# Patient Record
Sex: Male | Born: 1980 | Race: White | Hispanic: No | Marital: Married | Smoking: Never smoker
Health system: Southern US, Community
[De-identification: ages and names within clinical notes are randomized; demographics above are authoritative.]

## PROBLEM LIST (undated history)

## (undated) DIAGNOSIS — C801 Malignant (primary) neoplasm, unspecified: Secondary | ICD-10-CM

## (undated) DIAGNOSIS — I456 Pre-excitation syndrome: Secondary | ICD-10-CM

## (undated) DIAGNOSIS — R12 Heartburn: Secondary | ICD-10-CM

## (undated) DIAGNOSIS — J189 Pneumonia, unspecified organism: Secondary | ICD-10-CM

## (undated) DIAGNOSIS — R0600 Dyspnea, unspecified: Secondary | ICD-10-CM

## (undated) HISTORY — PX: FRACTURE SURGERY: SHX138

## (undated) MED FILL — Fosaprepitant Dimeglumine For IV Infusion 150 MG (Base Eq): INTRAVENOUS | Qty: 5 | Status: AC

## (undated) MED FILL — Dexamethasone Sodium Phosphate Inj 100 MG/10ML: INTRAMUSCULAR | Qty: 1 | Status: AC

---

## 2021-02-16 ENCOUNTER — Other Ambulatory Visit: Payer: Self-pay | Admitting: Specialist

## 2021-02-16 ENCOUNTER — Other Ambulatory Visit (HOSPITAL_COMMUNITY): Payer: Self-pay | Admitting: Specialist

## 2021-02-16 DIAGNOSIS — R059 Cough, unspecified: Secondary | ICD-10-CM

## 2021-02-16 DIAGNOSIS — R918 Other nonspecific abnormal finding of lung field: Secondary | ICD-10-CM

## 2021-02-23 ENCOUNTER — Other Ambulatory Visit: Payer: Self-pay | Admitting: Specialist

## 2021-02-23 ENCOUNTER — Ambulatory Visit: Payer: Managed Care, Other (non HMO)

## 2021-02-23 ENCOUNTER — Ambulatory Visit
Admission: RE | Admit: 2021-02-23 | Discharge: 2021-02-23 | Disposition: A | Discharge status: Home / Self Care | Payer: Managed Care, Other (non HMO) | Source: Ambulatory Visit | Attending: Specialist | Admitting: Specialist

## 2021-02-23 DIAGNOSIS — R059 Cough, unspecified: Secondary | ICD-10-CM

## 2021-02-23 DIAGNOSIS — R918 Other nonspecific abnormal finding of lung field: Secondary | ICD-10-CM

## 2021-02-23 IMAGING — CT CT CHEST W/O CM
2 of 4 series · 11 of 36 positions shown, 13 images · non-contrast
Comparison: Report from prior chest radiographs without available
images, most recent [DATE].

CLINICAL DATA: Cough. Completed antibiotic therapy four days prior
for reported pneumonia.

EXAM:
CT CHEST WITHOUT CONTRAST
TECHNIQUE: Multidetector CT imaging of the chest was performed following the
standard protocol without IV contrast.

[Series 2: chest 2.00 br40 s3 · axial · 0.57mm/px · z∈[+1503,+1789]mm · 8 of 170 slices shown, 10 images (1 of 2)]
[im 14/170  mediastinal]
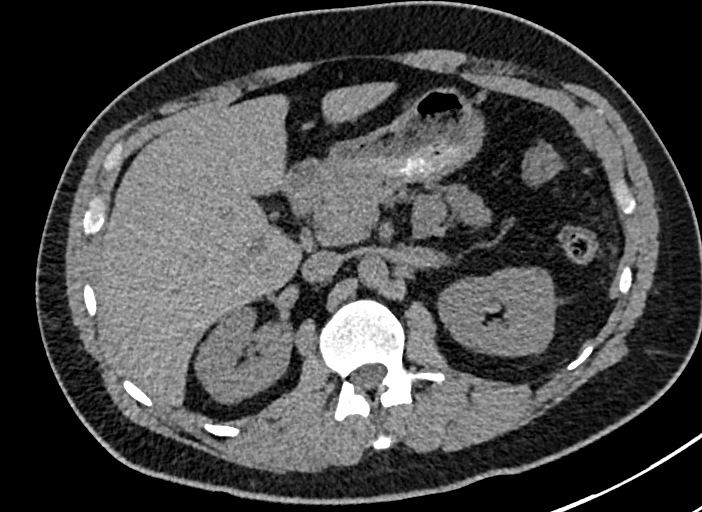
[im 14/170  lung]
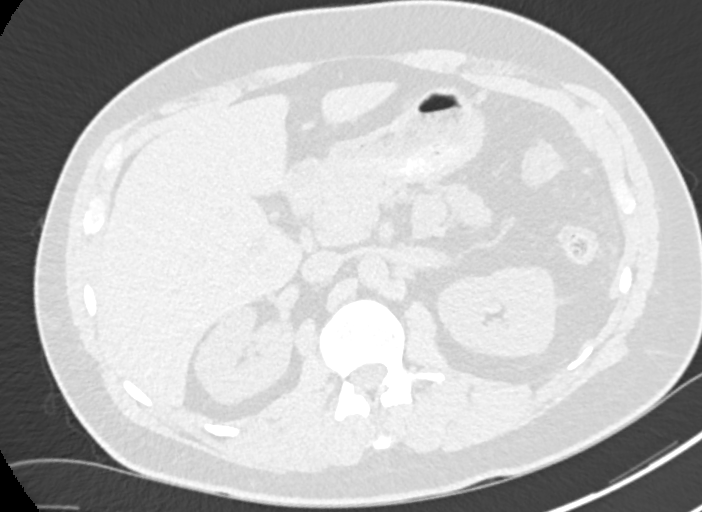
[im 40/170  lung]
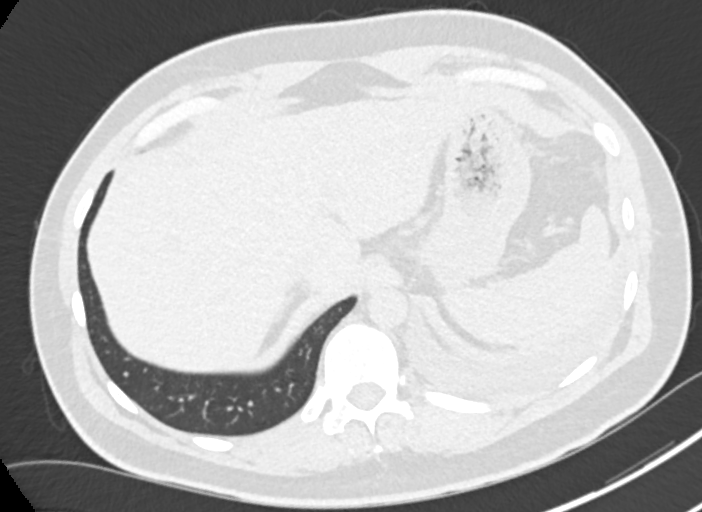
[im 53/170  lung]
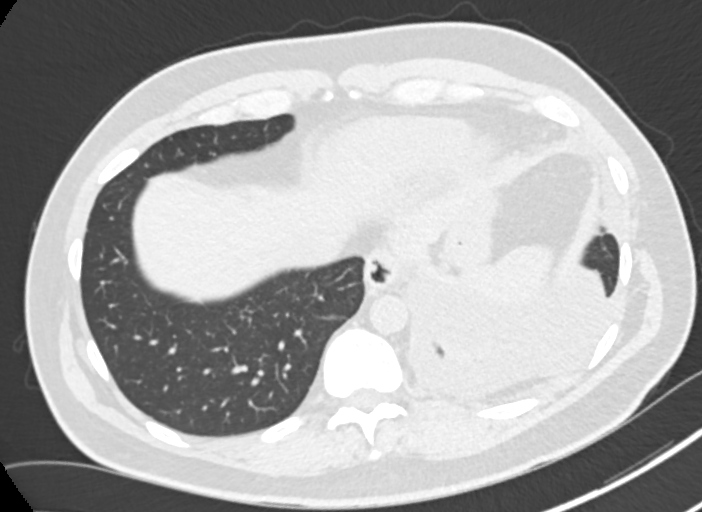
[im 79/170  lung]
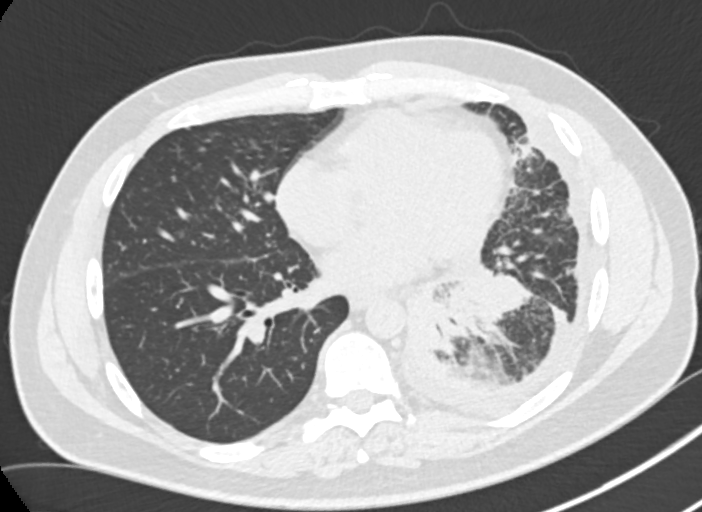
[im 92/170  mediastinal]
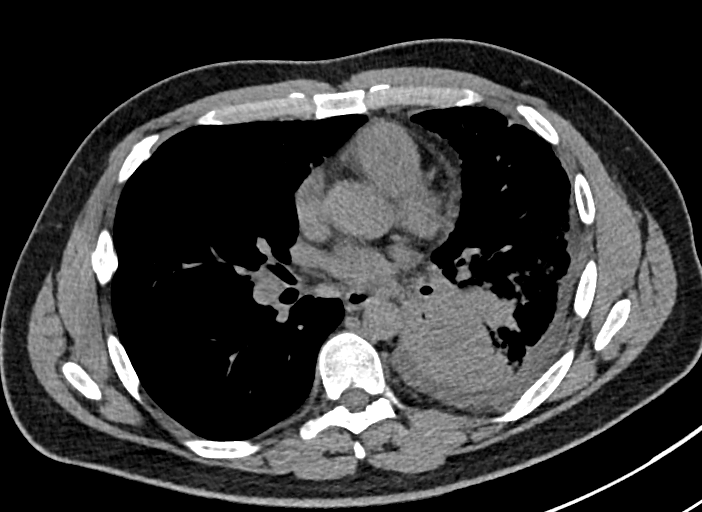
[im 92/170  lung]
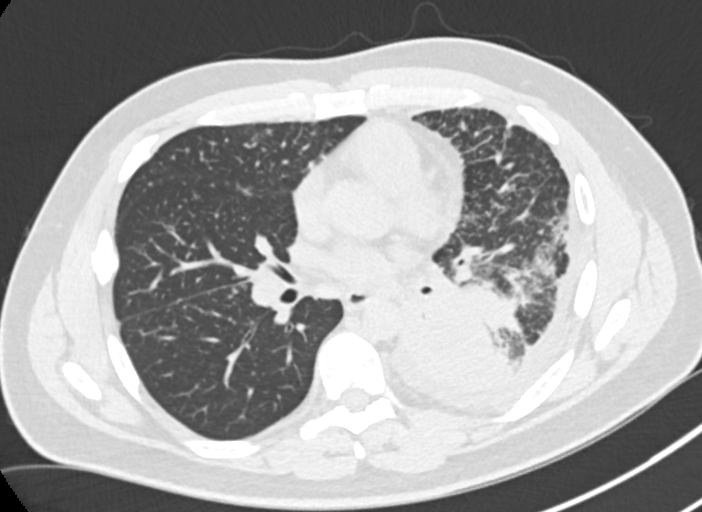
[im 118/170  lung]
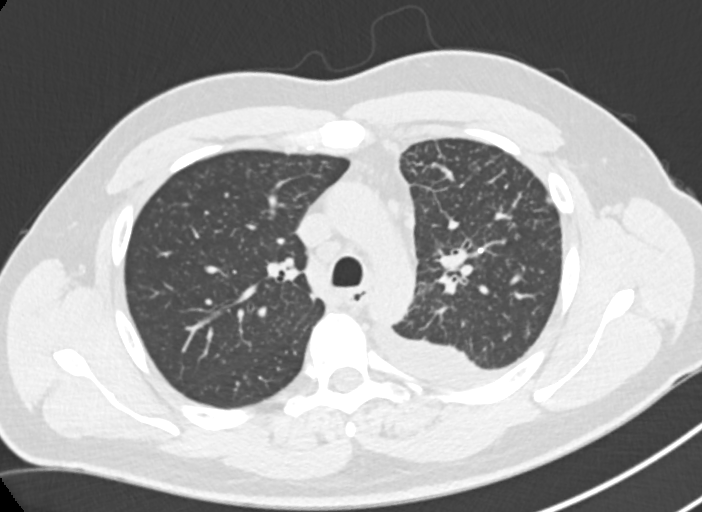
[im 131/170  lung]
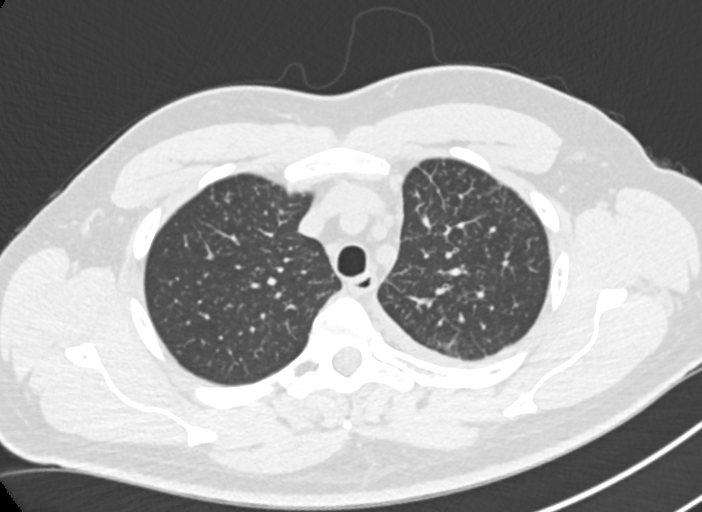
[im 157/170  lung]
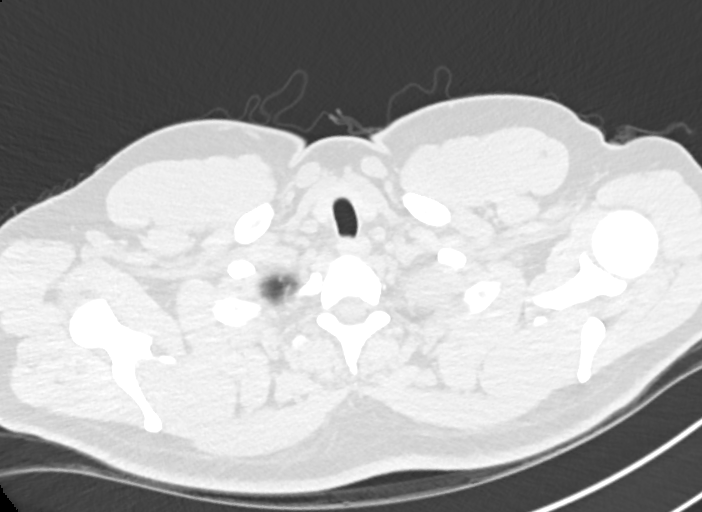

[Series 4: chest 2.00 br40 s3 · coronal · 0.67mm/px · 3 of 145 slices shown (2 of 2)]
[im 29/145  lung]
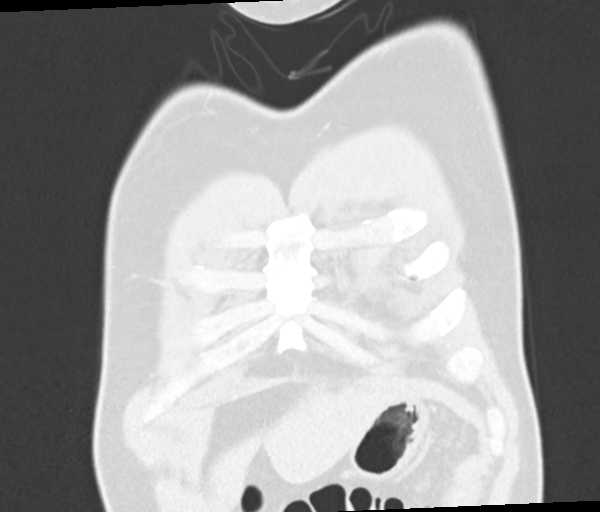
[im 58/145  lung]
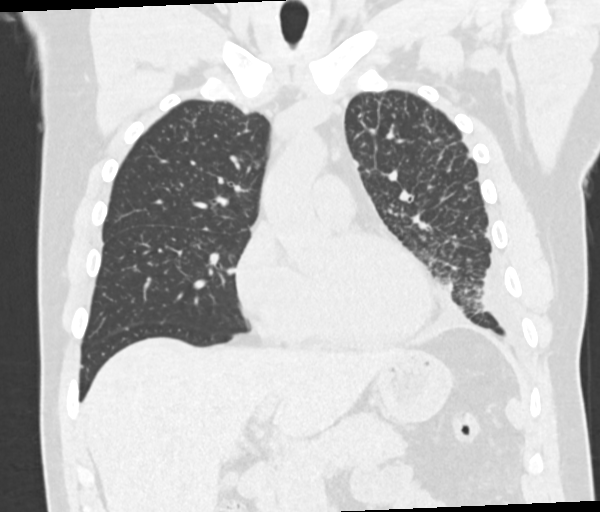
[im 87/145  lung]
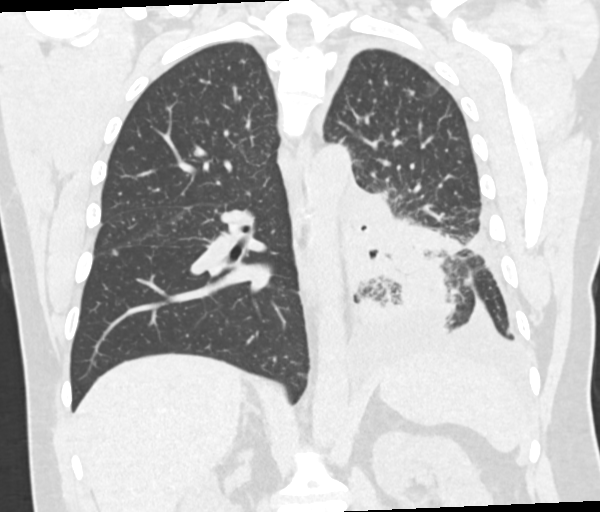

[11 of 36 positions shown; findings below may reference images not displayed]

FINDINGS: Cardiovascular: Normal heart size. No significant pericardial
effusion/thickening. Great vessels are normal in course and caliber.

Mediastinum/Nodes: No discrete thyroid nodules. Unremarkable
esophagus. No axillary adenopathy. Mildly enlarged 1.0 cm short axis
diameter subcarinal node (series 2/image 71). Mildly enlarged 1.0 cm
right paratracheal node (series 2/image 57). No discrete hilar
adenopathy on these noncontrast images.

Lungs/Pleura: No pneumothorax. Small dependent left pleural
effusion. No right pleural effusion. Dense patchy consolidation
replacing much of the left lower lobe, appearing masslike in the
superior segment left lower lobe measuring 6.1 x 4.0 cm (series
8/image 74) with associated bulging of left major fissure, with
associated left lower lobe volume loss. Scattered asymmetric
interlobular septal thickening throughout the left upper lobe. Fine
nodularity throughout both lungs with an upper lobe predominance,
appearing predominantly centrilobular. Scattered nodular foci of
peribronchovascular consolidation in left upper lobe, largest 7 mm
(series 2/image 56).

Upper abdomen: Patchy nodular fat stranding throughout the anterior
left upper quadrant peritoneal fat (series 2/image 163).

Musculoskeletal: No aggressive appearing focal osseous lesions.
Subacute healing lateral right sixth rib fracture.
IMPRESSION: 1. Patchy nodular fat stranding throughout the anterior left upper
quadrant peritoneal fat, nonspecific, cannot exclude peritoneal
carcinomatosis. Dedicated CT abdomen/pelvis with oral and IV
contrast recommended for further evaluation.
2. Dense patchy consolidation replacing much of the left lower lung
lobe, appearing masslike in the superior segment left lower lobe,
with associated bulging of the left major fissure and associated
left lower lobe volume loss. Fine nodularity throughout both lungs
with an upper lobe predominance. Asymmetric left upper lobe
interlobular septal thickening. These findings are indeterminate,
with differential including multilobar pneumonia, sarcoidosis or a
neoplastic process. The persistence on radiographs back to
[DATE] despite antibiotic therapy make sarcoidosis or a
neoplastic process more likely. Pulmonology consultation suggested
for consideration of bronchoscopic evaluation.
3. Small dependent left pleural effusion.
4. Mild mediastinal lymphadenopathy, nonspecific.
5. Subacute healing lateral right sixth rib fracture.

These results will be called to the ordering clinician or
representative by the Radiologist Assistant, and communication
documented in the PACS or [REDACTED].

## 2021-03-07 ENCOUNTER — Inpatient Hospital Stay: Admission: RE | Admit: 2021-03-07 | Payer: Managed Care, Other (non HMO) | Source: Ambulatory Visit

## 2021-03-08 ENCOUNTER — Other Ambulatory Visit: Payer: Self-pay

## 2021-03-08 ENCOUNTER — Other Ambulatory Visit
Admission: RE | Admit: 2021-03-08 | Discharge: 2021-03-08 | Disposition: A | Discharge status: Home / Self Care | Payer: Managed Care, Other (non HMO) | Source: Ambulatory Visit | Attending: Pulmonary Disease | Admitting: Pulmonary Disease

## 2021-03-08 HISTORY — DX: Heartburn: R12

## 2021-03-08 HISTORY — DX: Pneumonia, unspecified organism: J18.9

## 2021-03-08 HISTORY — DX: Dyspnea, unspecified: R06.00

## 2021-03-08 HISTORY — DX: Pre-excitation syndrome: I45.6

## 2021-03-08 NOTE — Patient Instructions (Addendum)
Your procedure is scheduled on: 03/15/21  Report to the Registration Desk on the 1st floor of the [facility]. To find out your arrival time, please call [phone] between 1PM - 3PM on: Report To [facility] on 03/10/21 at 815 am for Covid test and Labs.  REMEMBER: Instructions that are not followed completely may result in serious medical risk, up to and including death; or upon the discretion of your surgeon and anesthesiologist your surgery may need to be rescheduled.  Do not eat food after midnight the night before surgery.  No gum chewing, lozengers or hard candies.  You may however, drink CLEAR liquids up to 2 hours before you are scheduled to arrive for your surgery. Do not drink anything within 2 hours of your scheduled arrival time.  Clear liquids include: - water  - apple juice without pulp - gatorade (not RED, PURPLE, OR BLUE) - black coffee or tea (Do NOT add milk or creamers to the coffee or tea) Do NOT drink anything that is not on this list.  TAKE THESE MEDICATIONS THE MORNING OF SURGERY WITH A SIP OF WATER:  - ADVAIR DISKUS 250-50 MCG/ACT AEPB - benzonatate (TESSALON) 200 MG capsule  Use inhaler  albuterol (VENTOLIN HFA) 108 (90 Base) MCG/ACT on the day of surgery and bring to the hospital.  One week prior to surgery: Stop Anti-inflammatories (NSAIDS) such as Advil, Aleve, Ibuprofen, Motrin, Naproxen, Naprosyn and Aspirin based products such as Excedrin, Goodys Powder, BC Powder.  Stop ANY OVER THE COUNTER supplements until after surgery. You may however, continue to take Tylenol if needed for pain up until the day of surgery.  No Alcohol for 24 hours before or after surgery.  No Smoking including e-cigarettes for 24 hours prior to surgery.  No chewable tobacco products for at least 6 hours prior to surgery.  No nicotine patches on the day of surgery.  Do not use any "recreational" drugs for at least a week prior to your surgery.  Please be advised that  the combination of cocaine and anesthesia may have negative outcomes, up to and including death. If you test positive for cocaine, your surgery will be cancelled.  On the morning of surgery brush your teeth with toothpaste and water, you may rinse your mouth with mouthwash if you wish. Do not swallow any toothpaste or mouthwash.  Do not wear jewelry, make-up, hairpins, clips or nail polish.  Do not wear lotions, powders, or perfumes.   Do not shave body from the neck down 48 hours prior to surgery just in case you cut yourself which could leave a site for infection.  Also, freshly shaved skin may become irritated if using the CHG soap.  Contact lenses, hearing aids and dentures may not be worn into surgery.  Do not bring valuables to the hospital. [facility] is not responsible for any missing/lost belongings or valuables.   Use CHG Soap or wipes as directed on instruction sheet.  Notify your doctor if there is any change in your medical condition (cold, fever, infection).  Wear comfortable clothing (specific to your surgery type) to the hospital.  Plan for stool softeners for home use; pain medications have a tendency to cause constipation. You can also help prevent constipation by eating foods high in fiber such as fruits and vegetables and drinking plenty of fluids as your diet allows.  After surgery, you can help prevent lung complications by doing breathing exercises.  Take deep breaths and cough every 1-2 hours. Your  doctor may order a device called an Incentive Spirometer to help you take deep breaths. When coughing or sneezing, hold a pillow firmly against your incision with both hands. This is called "splinting." Doing this helps protect your incision. It also decreases belly discomfort.  If you are being admitted to the hospital overnight, leave your suitcase in the car. After surgery it may be brought to your room.  If you are being discharged the day of surgery, you will  not be allowed to drive home. You will need a responsible adult (18 years or older) to drive you home and stay with you that night.   If you are taking public transportation, you will need to have a responsible adult (18 years or older) with you. Please confirm with your physician that it is acceptable to use public transportation.   Please call the [facility] at [phone] if you have any questions about these instructions.  Surgery Visitation Policy:  Patients undergoing a surgery or procedure may have one family member or support person with them as long as that person is not COVID-19 positive or experiencing its symptoms.  That person may remain in the waiting area during the procedure.  Inpatient Visitation:    Visiting hours are 7 a.m. to 8 p.m. Inpatients will be allowed two visitors daily. The visitors may change each day during the patient's stay. No visitors under the age of 1. Any visitor under the age of 54 must be accompanied by an adult. The visitor must pass COVID-19 screenings, use hand sanitizer when entering and exiting the patient's room and wear a mask at all times, including in the patient's room. Patients must also wear a mask when staff or their visitor are in the room. Masking is required regardless of vaccination status.  Total Hip/Knee Replacement Preoperative Educational Video  To better prepare for surgery, please view our videos that explain the physical activity and discharge planning required to have the best surgical recovery at [facility].  [email or web address]      Questions? Call [phone] or email [email or web address]

## 2021-03-09 ENCOUNTER — Other Ambulatory Visit: Payer: Managed Care, Other (non HMO)

## 2021-03-10 ENCOUNTER — Other Ambulatory Visit
Admission: RE | Admit: 2021-03-10 | Discharge: 2021-03-10 | Disposition: A | Discharge status: Home / Self Care | Payer: Managed Care, Other (non HMO) | Source: Ambulatory Visit | Attending: Pulmonary Disease | Admitting: Pulmonary Disease

## 2021-03-10 ENCOUNTER — Other Ambulatory Visit: Payer: Self-pay

## 2021-03-10 DIAGNOSIS — I456 Pre-excitation syndrome: Secondary | ICD-10-CM | POA: Insufficient documentation

## 2021-03-10 DIAGNOSIS — Z20822 Contact with and (suspected) exposure to covid-19: Secondary | ICD-10-CM | POA: Insufficient documentation

## 2021-03-10 DIAGNOSIS — Z01812 Encounter for preprocedural laboratory examination: Secondary | ICD-10-CM | POA: Diagnosis present

## 2021-03-10 LAB — CBC
HCT: 44.1 % (ref 39.0–52.0)
Hemoglobin: 15.1 g/dL (ref 13.0–17.0)
MCH: 31.6 pg (ref 26.0–34.0)
MCHC: 34.2 g/dL (ref 30.0–36.0)
MCV: 92.3 fL (ref 80.0–100.0)
Platelets: 342 10*3/uL (ref 150–400)
RBC: 4.78 MIL/uL (ref 4.22–5.81)
RDW: 12.3 % (ref 11.5–15.5)
WBC: 10.8 10*3/uL — ABNORMAL HIGH (ref 4.0–10.5)
nRBC: 0 % (ref 0.0–0.2)

## 2021-03-10 LAB — PROTIME-INR
INR: 1 (ref 0.8–1.2)
Prothrombin Time: 13.3 seconds (ref 11.4–15.2)

## 2021-03-10 LAB — SARS CORONAVIRUS 2 (TAT 6-24 HRS): SARS Coronavirus 2: NEGATIVE

## 2021-03-10 LAB — APTT: aPTT: 26 seconds (ref 24–36)

## 2021-03-14 ENCOUNTER — Other Ambulatory Visit: Payer: Self-pay | Admitting: Pulmonary Disease

## 2021-03-14 DIAGNOSIS — R918 Other nonspecific abnormal finding of lung field: Secondary | ICD-10-CM

## 2021-03-15 ENCOUNTER — Encounter
Admission: RE | Disposition: A | Discharge status: Home / Self Care | Payer: Self-pay | Source: Home / Self Care | Attending: Pulmonary Disease

## 2021-03-15 ENCOUNTER — Ambulatory Visit: Payer: Managed Care, Other (non HMO)

## 2021-03-15 ENCOUNTER — Ambulatory Visit
Admission: RE | Admit: 2021-03-15 | Discharge: 2021-03-15 | Disposition: A | Discharge status: Home / Self Care | Payer: Managed Care, Other (non HMO) | Attending: Pulmonary Disease | Admitting: Pulmonary Disease

## 2021-03-15 ENCOUNTER — Encounter: Payer: Self-pay | Admitting: *Deleted

## 2021-03-15 ENCOUNTER — Ambulatory Visit: Payer: Managed Care, Other (non HMO) | Admitting: Urgent Care

## 2021-03-15 ENCOUNTER — Ambulatory Visit
Admission: RE | Admit: 2021-03-15 | Discharge: 2021-03-15 | Disposition: A | Discharge status: Home / Self Care | Payer: Managed Care, Other (non HMO) | Source: Ambulatory Visit | Attending: Pulmonary Disease | Admitting: Pulmonary Disease

## 2021-03-15 ENCOUNTER — Other Ambulatory Visit: Payer: Self-pay

## 2021-03-15 DIAGNOSIS — Z885 Allergy status to narcotic agent status: Secondary | ICD-10-CM | POA: Insufficient documentation

## 2021-03-15 DIAGNOSIS — C771 Secondary and unspecified malignant neoplasm of intrathoracic lymph nodes: Secondary | ICD-10-CM | POA: Diagnosis not present

## 2021-03-15 DIAGNOSIS — F17220 Nicotine dependence, chewing tobacco, uncomplicated: Secondary | ICD-10-CM | POA: Diagnosis not present

## 2021-03-15 DIAGNOSIS — R918 Other nonspecific abnormal finding of lung field: Secondary | ICD-10-CM

## 2021-03-15 DIAGNOSIS — K219 Gastro-esophageal reflux disease without esophagitis: Secondary | ICD-10-CM | POA: Insufficient documentation

## 2021-03-15 DIAGNOSIS — J984 Other disorders of lung: Secondary | ICD-10-CM | POA: Diagnosis not present

## 2021-03-15 DIAGNOSIS — C3492 Malignant neoplasm of unspecified part of left bronchus or lung: Secondary | ICD-10-CM | POA: Diagnosis present

## 2021-03-15 DIAGNOSIS — J449 Chronic obstructive pulmonary disease, unspecified: Secondary | ICD-10-CM | POA: Diagnosis not present

## 2021-03-15 DIAGNOSIS — I456 Pre-excitation syndrome: Secondary | ICD-10-CM | POA: Insufficient documentation

## 2021-03-15 DIAGNOSIS — Z801 Family history of malignant neoplasm of trachea, bronchus and lung: Secondary | ICD-10-CM | POA: Diagnosis not present

## 2021-03-15 DIAGNOSIS — Z9889 Other specified postprocedural states: Secondary | ICD-10-CM

## 2021-03-15 HISTORY — PX: VIDEO BRONCHOSCOPY WITH ENDOBRONCHIAL NAVIGATION: SHX6175

## 2021-03-15 HISTORY — PX: VIDEO BRONCHOSCOPY WITH ENDOBRONCHIAL ULTRASOUND: SHX6177

## 2021-03-15 LAB — KOH PREP: KOH Prep: NONE SEEN

## 2021-03-15 IMAGING — DX DG CHEST 1V PORT
1 series · 1 of 1 positions shown · non-contrast
Comparison: CT chest [DATE]

CLINICAL DATA: Post bronchoscopy on the left

EXAM:
PORTABLE CHEST 1 VIEW

[chest ap]
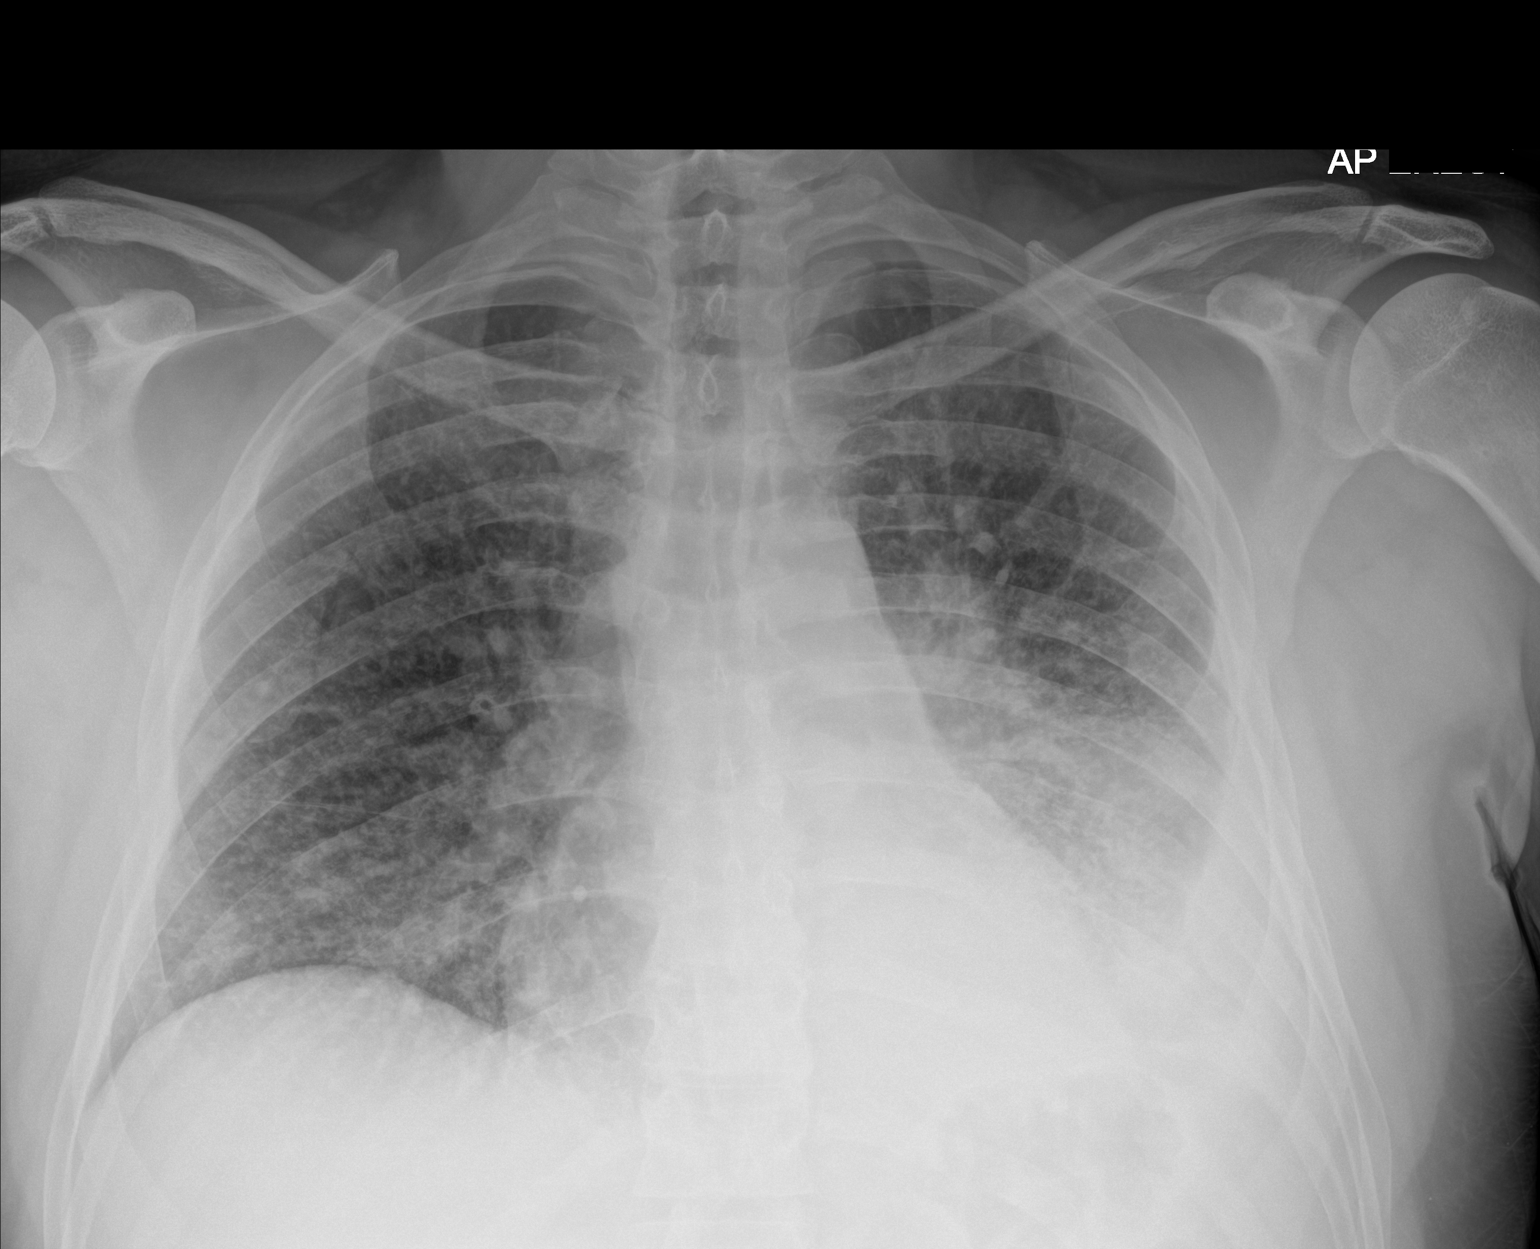

[1 of 1 positions shown; findings below may reference images not displayed]

FINDINGS: Mild to moderate left pleural effusion. Left lower lobe
consolidation. No pneumothorax.

Mild right lower lobe airspace disease appears new from the CT
today. There is mild vascular congestion which may be due to mild
fluid overload
IMPRESSION: No pneumothorax post bronchoscopy

Persistent left pleural effusion and left lower lobe
consolidation/mass

Progression of right lower lobe airspace disease

Pulmonary vascular  congestion.  Question fluid overload

## 2021-03-15 IMAGING — CT CT CHEST SUPER D W/O CM
2 of 4 series · 15 of 36 positions shown, 18 images · non-contrast
Comparison: [DATE]

CLINICAL DATA: Recent pneumonia.  Cough.

EXAM:
CT CHEST WITHOUT CONTRAST
TECHNIQUE: Multidetector CT imaging of the chest was performed using thin slice
collimation for electromagnetic bronchoscopy planning purposes,
without intravenous contrast.

[Series 5: super d · axial · 0.69mm/px · z∈[-700,-372]mm · 12 of 450 slices shown, 15 images]
[im 20/450  mediastinal]
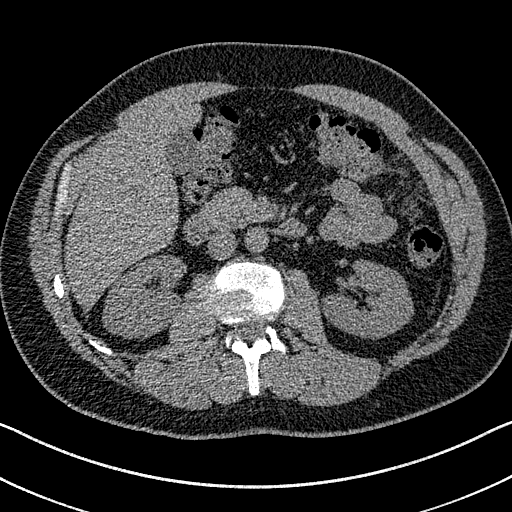
[im 20/450  lung]
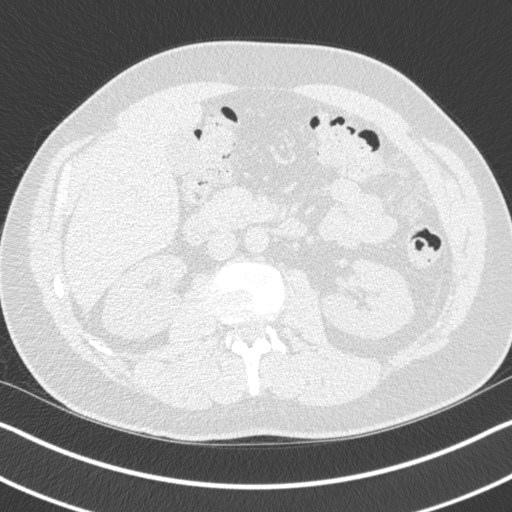
[im 59/450  lung]
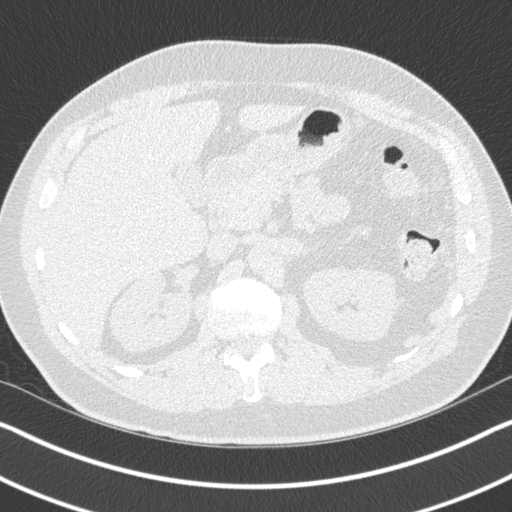
[im 98/450  lung]
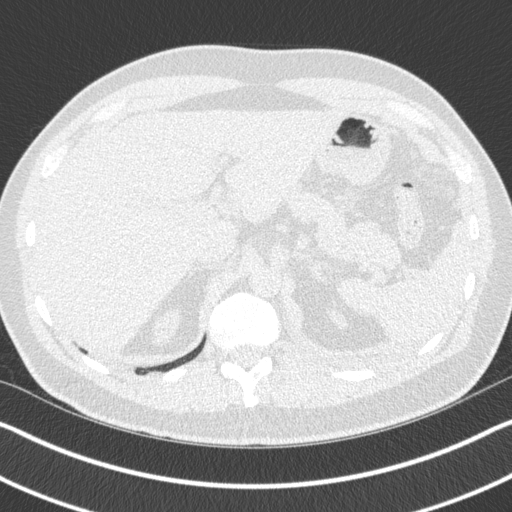
[im 137/450  lung]
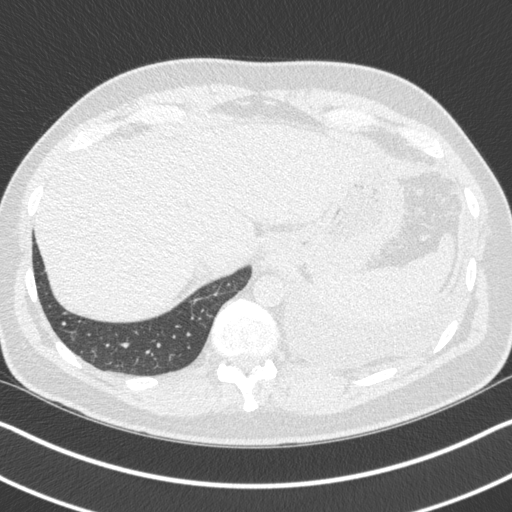
[im 176/450  mediastinal]
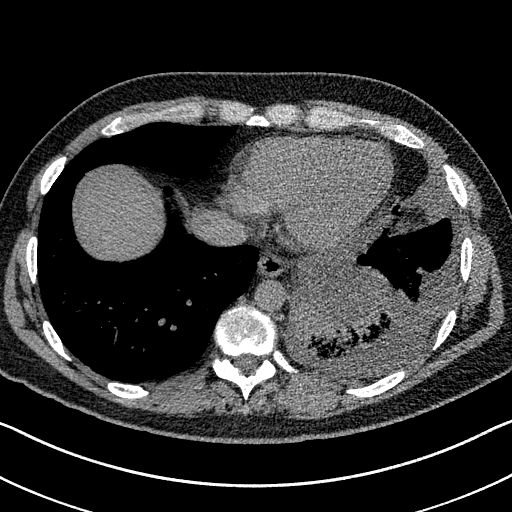
[im 176/450  lung]
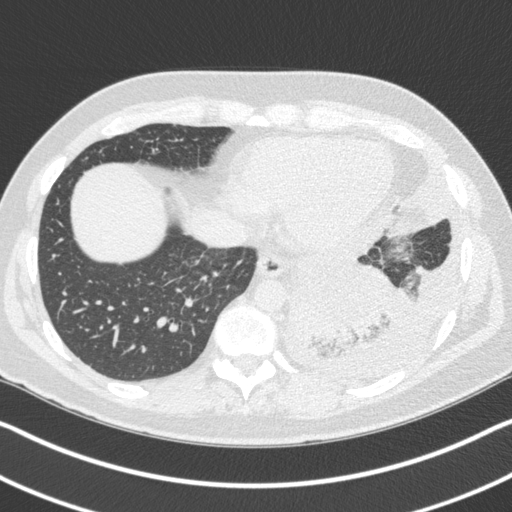
[im 215/450  lung]
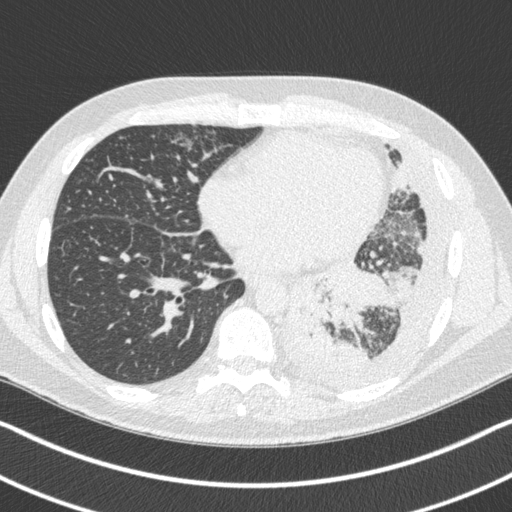
[im 235/450  lung]
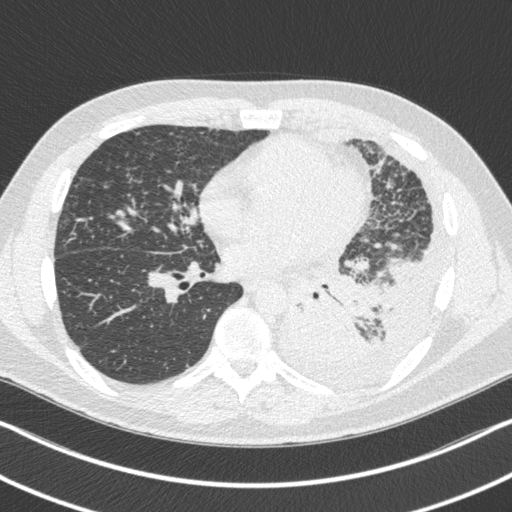
[im 274/450  lung]
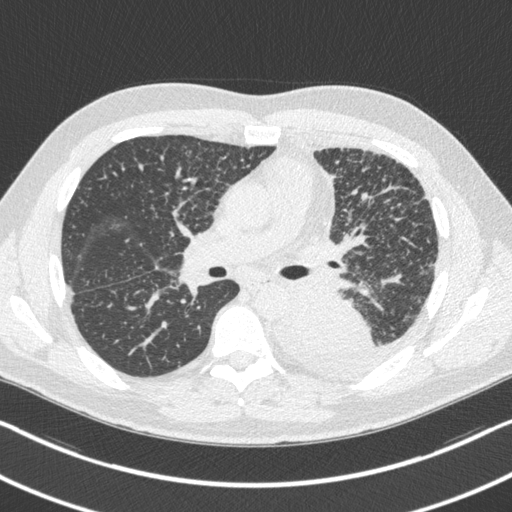
[im 313/450  mediastinal]
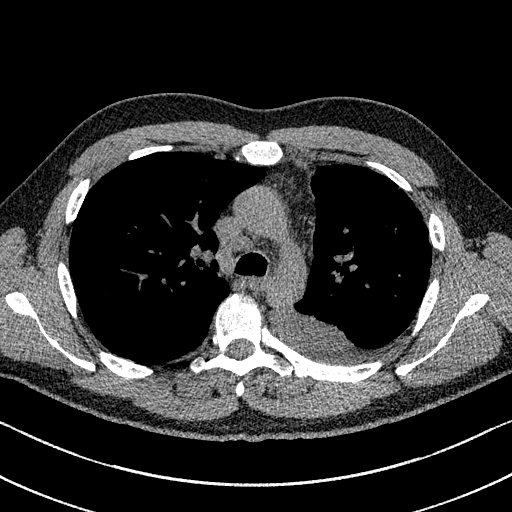
[im 313/450  lung]
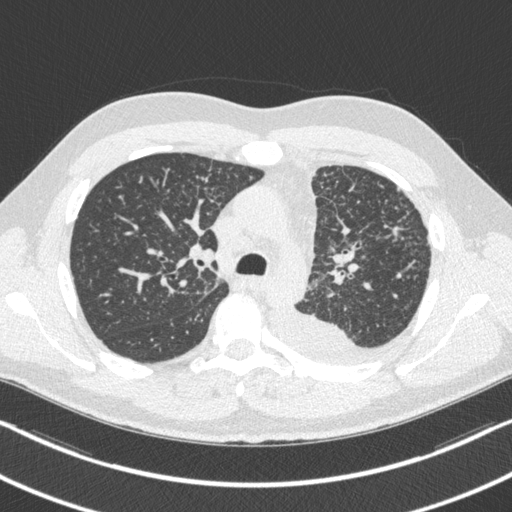
[im 352/450  lung]
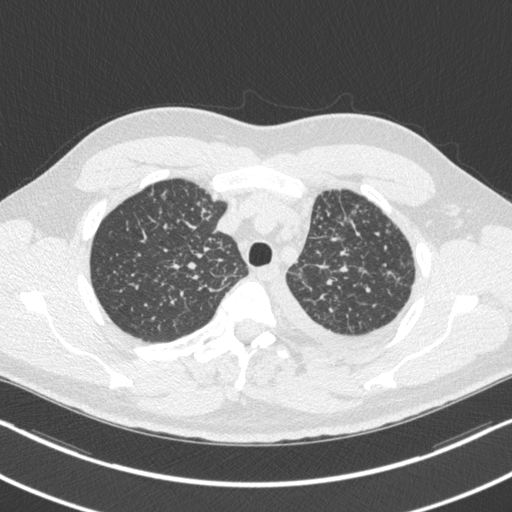
[im 391/450  lung]
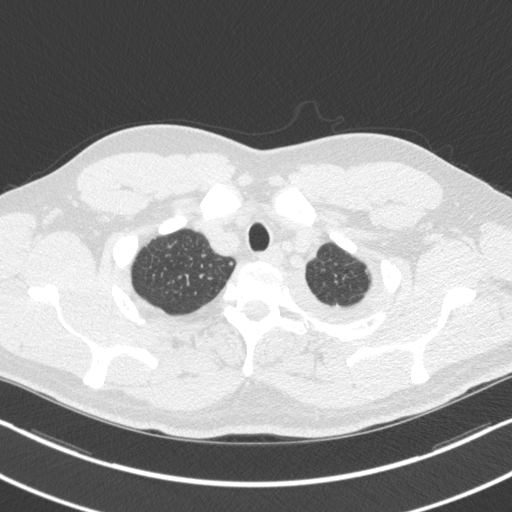
[im 430/450  lung]
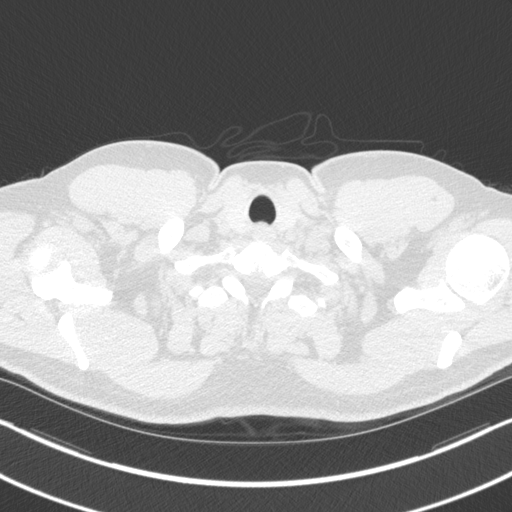

[Series 6: coronal · coronal · 0.72mm/px · 3 of 144 slices shown]
[im 29/144  lung]
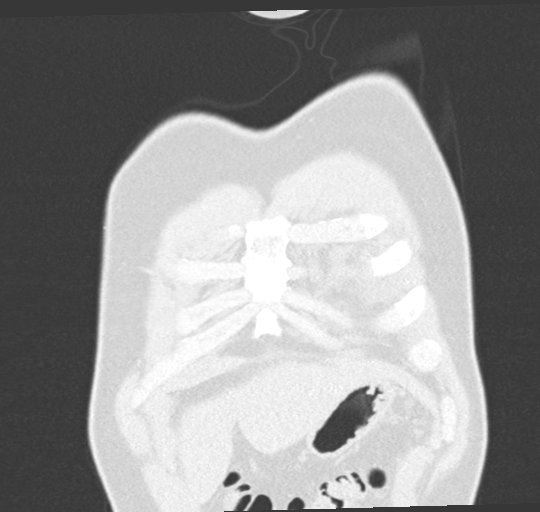
[im 58/144  lung]
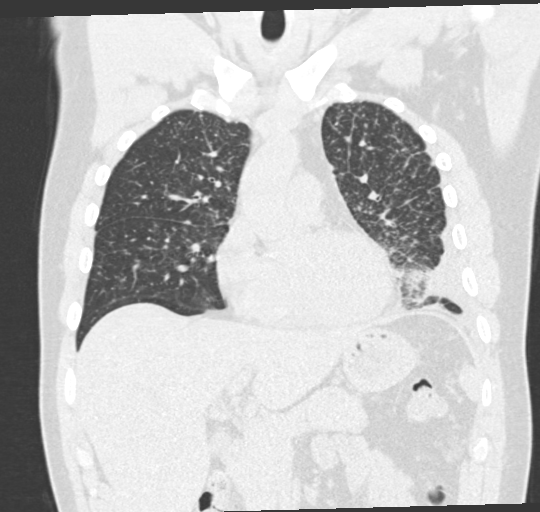
[im 86/144  lung]
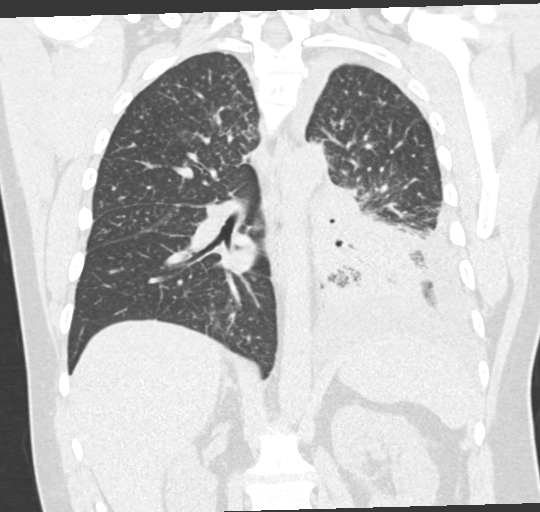

[15 of 36 positions shown; findings below may reference images not displayed]

FINDINGS: Cardiovascular: The heart size is normal. No substantial pericardial
effusion. No thoracic aortic aneurysm.

Mediastinum/Nodes: 10 mm short axis right paratracheal node evident.
10 mm short axis subcarinal lymph node associated. No definite right
hilar lymphadenopathy on this noncontrast study. Soft tissue
fullness noted in the left hilum although assessment limited by lack
of intravenous contrast material. The esophagus has normal imaging
features. There is no axillary lymphadenopathy.

Lungs/Pleura: Diffuse micro nodularity again noted bilaterally with
an upper lung predominance. The collapse/consolidative change in the
left lower lobe is similar to prior with 6.1 x 4.0 cm measured
masslike consolidative opacity stable at 6.1 x 4.5 cm today. Small
left pleural effusion evident.

Upper Abdomen: Nodular soft tissue attenuation in the left upper
quadrant/omental region is again noted.

Musculoskeletal: 11 mm lucency is identified in the T10 vertebral
body (43/2)
IMPRESSION: 1. No substantial interval change in the dense consolidative opacity
involving the left lower lobe with associated small left pleural
effusion.
2. Bilateral relatively symmetric micro nodularity demonstrates an
upper lobe predominance, unchanged. This may be
infectious/inflammatory in etiology.
3. Similar appearance of the ill-defined nodular soft tissue
involving the anterior left upper quadrant, likely omental. Given
that metastatic disease could certainly have this appearance,
dedicated abdomen/pelvis CT with oral and intravenous contrast
recommended to further evaluate.

## 2021-03-15 SURGERY — VIDEO BRONCHOSCOPY WITH ENDOBRONCHIAL NAVIGATION
Anesthesia: General

## 2021-03-15 MED ORDER — ESMOLOL HCL 100 MG/10ML IV SOLN
INTRAVENOUS | Status: DC | PRN
  Administered 2021-03-15: 10 mg via INTRAVENOUS

## 2021-03-15 MED ORDER — DEXAMETHASONE SODIUM PHOSPHATE 10 MG/ML IJ SOLN
INTRAMUSCULAR | Status: DC | PRN
  Administered 2021-03-15: 10 mg via INTRAVENOUS

## 2021-03-15 MED ORDER — PROPOFOL 10 MG/ML IV BOLUS
INTRAVENOUS | Status: DC | PRN
  Administered 2021-03-15: 200 mg via INTRAVENOUS

## 2021-03-15 MED ORDER — MIDAZOLAM HCL 2 MG/2ML IJ SOLN
INTRAMUSCULAR | Status: DC | PRN
  Administered 2021-03-15: 2 mg via INTRAVENOUS

## 2021-03-15 MED ORDER — CHLORHEXIDINE GLUCONATE 0.12 % MT SOLN
OROMUCOSAL | Status: AC
  Administered 2021-03-15: 15 mL via OROMUCOSAL
  Filled 2021-03-15: qty 15

## 2021-03-15 MED ORDER — GLYCOPYRROLATE 0.2 MG/ML IJ SOLN
INTRAMUSCULAR | Status: DC | PRN
  Administered 2021-03-15: .2 mg via INTRAVENOUS

## 2021-03-15 MED ORDER — ONDANSETRON HCL 4 MG/2ML IJ SOLN
INTRAMUSCULAR | Status: DC | PRN
  Administered 2021-03-15: 4 mg via INTRAVENOUS

## 2021-03-15 MED ORDER — PROPOFOL 10 MG/ML IV BOLUS
INTRAVENOUS | Status: AC
  Filled 2021-03-15: qty 20

## 2021-03-15 MED ORDER — CHLORHEXIDINE GLUCONATE 0.12 % MT SOLN: 15.0000 mL | Freq: Once | OROMUCOSAL | Status: AC

## 2021-03-15 MED ORDER — LACTATED RINGERS IV SOLN: INTRAVENOUS | Status: DC | PRN

## 2021-03-15 MED ORDER — FENTANYL CITRATE (PF) 100 MCG/2ML IJ SOLN
INTRAMUSCULAR | Status: DC | PRN
  Administered 2021-03-15 (×2): 50 ug via INTRAVENOUS

## 2021-03-15 MED ORDER — FENTANYL CITRATE (PF) 100 MCG/2ML IJ SOLN: 25.0000 ug | INTRAMUSCULAR | Status: DC | PRN

## 2021-03-15 MED ORDER — MIDAZOLAM HCL 2 MG/2ML IJ SOLN
INTRAMUSCULAR | Status: AC
  Filled 2021-03-15: qty 2

## 2021-03-15 MED ORDER — LIDOCAINE HCL (CARDIAC) PF 100 MG/5ML IV SOSY
PREFILLED_SYRINGE | INTRAVENOUS | Status: DC | PRN
  Administered 2021-03-15: 100 mg via INTRAVENOUS

## 2021-03-15 MED ORDER — FAMOTIDINE 20 MG PO TABS: 20.0000 mg | ORAL_TABLET | Freq: Once | ORAL | Status: AC

## 2021-03-15 MED ORDER — ROCURONIUM BROMIDE 100 MG/10ML IV SOLN
INTRAVENOUS | Status: DC | PRN
  Administered 2021-03-15: 50 mg via INTRAVENOUS

## 2021-03-15 MED ORDER — FENTANYL CITRATE (PF) 100 MCG/2ML IJ SOLN
INTRAMUSCULAR | Status: AC
  Filled 2021-03-15: qty 2

## 2021-03-15 MED ORDER — DEXMEDETOMIDINE (PRECEDEX) IN NS 20 MCG/5ML (4 MCG/ML) IV SYRINGE
PREFILLED_SYRINGE | INTRAVENOUS | Status: DC | PRN
  Administered 2021-03-15: 12 ug via INTRAVENOUS
  Administered 2021-03-15: 20 ug via INTRAVENOUS

## 2021-03-15 MED ORDER — LACTATED RINGERS IV SOLN: INTRAVENOUS | Status: DC

## 2021-03-15 MED ORDER — FAMOTIDINE 20 MG PO TABS
ORAL_TABLET | ORAL | Status: AC
  Administered 2021-03-15: 20 mg via ORAL
  Filled 2021-03-15: qty 1

## 2021-03-15 MED ORDER — SUGAMMADEX SODIUM 500 MG/5ML IV SOLN
INTRAVENOUS | Status: DC | PRN
  Administered 2021-03-15: 200 mg via INTRAVENOUS

## 2021-03-15 MED ORDER — ONDANSETRON HCL 4 MG/2ML IJ SOLN: 4.0000 mg | Freq: Once | INTRAMUSCULAR | Status: DC | PRN

## 2021-03-15 MED ORDER — ORAL CARE MOUTH RINSE: 15.0000 mL | Freq: Once | OROMUCOSAL | Status: AC

## 2021-03-15 NOTE — Transfer of Care (Signed)
Immediate Anesthesia Transfer of Care Note  Patient: [name]  Procedure(s) Performed: VIDEO BRONCHOSCOPY WITH ENDOBRONCHIAL NAVIGATION (N/A ) VIDEO BRONCHOSCOPY WITH ENDOBRONCHIAL ULTRASOUND (N/A )  Patient Location: PACU  Anesthesia Type:General  Level of Consciousness: awake, drowsy and patient cooperative  Airway & Oxygen Therapy: Patient Spontanous Breathing and Patient connected to face mask oxygen  Post-op Assessment: Report given to RN and Post -op Vital signs reviewed and stable  Post vital signs: Reviewed and stable  Last Vitals:  Vitals Value Taken Time  BP 113/71 03/15/21 1421  Temp    Pulse 98 03/15/21 1425  Resp 19 03/15/21 1425  SpO2 98 % 03/15/21 1425  Vitals shown include unvalidated device data.  Last Pain:  Vitals:   03/15/21 1124  TempSrc: Temporal  PainSc: 2          Complications: No complications documented.

## 2021-03-15 NOTE — Procedures (Addendum)
ELECTROMAGNETIC NAVIGATIONAL BRONCHOSCOPY PROCEDURE NOTE  FIBEROPTIC BRONCHOSCOPY WITH BRONCHOALVEOLAR LAVAGE PROCEDURE NOTE  ENDOBRONCHIAL ULTRASOUND PROCEDURE NOTE    Flexible bronchoscopy was performed  by : Lanney Gins MD  assistance by : 1)Repiratory therapist  and 2)[employer] staff and 3) Anesthesia team and 4) Flouroscopy team   Indication for the procedure was :  Pre-procedural H&P. The following assessment was performed on the day of the procedure prior to initiating sedation History:  Chest pain n Dyspnea y Hemoptysis n Cough y Fever n Other pertinent items n  Examination Vital signs -reviewed as per nursing documentation today Cardiac    Murmurs: n  Rubs : n  Gallop: n Lungs Wheezing: n Rales : n Rhonchi :y  Other pertinent findings: SOB/hypoxemia due to chronic lung disease   Pre-procedural assessment for Procedural Sedation included: Depth of sedation: As per anesthesia team  ASA Classification:  2 Mallampati airway assessment: 3   Media Information         Document Information  Photos    03/15/2021 12:50  Attached To:  Hospital Encounter on 03/15/21   Source Information  Ottie Glazier, MD  [facility]   EDEMATOUS AIRWAYS WITH INTRINSIC STENOSIS   Media Information         Document Information  Photos    03/15/2021 12:50  Attached To:  Hospital Encounter on 03/15/21   Source Information  Ottie Glazier, MD  [facility]    Medication list reviewed: y  The patient's interval history was taken and revealed: no new complaints The pre- procedure physical examination revealed: No new findings Refer to prior clinic note for details.  Informed Consent: Informed consent was obtained from:  patient after explanation of procedure and risks, benefits, as well as alternative procedures available.  Explanation of level of sedation and possible transfusion was also provided.    Procedural Preparation: Time out was  performed and patient was identified by name and birthdate and procedure to be performed and side for sampling, if any, was specified. Pt was intubated by anesthesia.  The patient was appropriately draped.   Fiberoptic bronchoscopy with airway inspection and BAL Procedure findings:  Bronchoscope was inserted via ETT  without difficulty.  Posterior oropharynx, epiglottis, arytenoids, false cords and vocal cords were not visualized as these were bypassed by endotracheal tube. The distal trachea was normal in circumference and appearance without mucosal, cartilaginous or branching abnormalities.  The main carina was mildly splayed . All right and left lobar airways were visualized to the Subsegmental level.  Sub- sub segmental carinae were identified in all the distal airways.   Secretions were visible in the following airways and appeared to be clear.  The mucosa was : friable at RUL  Airways were notable for:        exophytic lesions :n       extrinsic compression in the following distributions: n.       Friable mucosa: y       Neurosurgeon /pigmentation: n     Post procedure Diagnosis:   Edematous airways of the left lung with narrowing over the superior segment of the left lower lobe     Electromagnetic Navigational Bronchoscopy Procedure Findings:  After appropriate CT-guided planning ENB scope was advanced via endotracheal tube and LG was advanced for registration.  Post appropriate planning and registration peripheral navigation was used to visualize target lesion.    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 Examination began to evaluate for pathologically enlarged lymph  nodes starting on the right side progressing to the left side.  All lymph node biopsies performed with 21-gauge needle. Lymph node biopsies were sent in cytolite for all stations.   Post procedure diagnosis: Metastatic atypical cells   Station 4R-1.3 cm biopsied 4 times with atypical cells found in a background of good lymphoid shedding suspicious for metastatic carcinoma Station 7 subcarinal-2.5 cm biopsied 4 times with atypical cells found with good lymphoid shedding suspicious for metastatic implants Station 10 L-1.4 cm-necrotic material with atypical cells suspicious for metastatic carcinoma  Specimens obtained included:                            Microbiology brushes: Left lower lobe; sent for mycology, AFB, aerobic and anaerobic cultures KOH prep                     Cytology brushes : Left lower lobe sent for cytology  Broncho-alveolar lavage site: Left lower lobe sent for microbiology and cytology                        100 ml volume infused 25 ml volume returned with sanguinous cellular and clotted material appearance  Fluoroscopy Used: Yes;        Pictorial documentation attached: 1.2                 Immediate sampling complications included: Patient had hypotension post procedure which was noted prior to procedure after anesthesia was delivered Epinephrine 0 ml was used topically  The bronchoscopy was terminated due to completion of the planned procedure and the bronchoscope was removed.   Total dosage of Lidocaine was 0 mg Total fluoroscopy time was 22 minutes   Estimated Blood loss: 2.2 cc.  There were no complications during procedure  Disposition: Patient will be going home with wife as well as father whom I met with and reviewed findings.  Follow up with Dr. Lanney Gins in 5 days for result discussion.     Ottie Glazier MD  [facility]

## 2021-03-15 NOTE — Progress Notes (Signed)
Per Dr Lanney Gins, Chest xray prior to discharge. No pneumothorax was noted on xray impression. VSS. o2 sats 97% on room air. Okay to d/c home with follow up on Monday June 6 with Dr Vella Kohler.

## 2021-03-15 NOTE — Anesthesia Postprocedure Evaluation (Signed)
Anesthesia Post Note  Patient: [name]  Procedure(s) Performed: VIDEO BRONCHOSCOPY WITH ENDOBRONCHIAL NAVIGATION (N/A ) VIDEO BRONCHOSCOPY WITH ENDOBRONCHIAL ULTRASOUND (N/A )  Patient location during evaluation: PACU Anesthesia Type: General Level of consciousness: awake and alert Pain management: pain level controlled Vital Signs Assessment: post-procedure vital signs reviewed and stable Respiratory status: spontaneous breathing and respiratory function stable Cardiovascular status: stable Anesthetic complications: no   No complications documented.   Last Vitals:  Vitals:   03/15/21 1124  BP: (!) 129/93  Pulse: 78  Resp: 18  Temp: 37.1 C  SpO2: 99%    Last Pain:  Vitals:   03/15/21 1124  TempSrc: Temporal  PainSc: 2                  Kemet Nijjar K

## 2021-03-15 NOTE — Anesthesia Procedure Notes (Signed)
Procedure Name: Intubation Performed by: Fletcher-Harrison, Kaziyah Parkison, CRNA Pre-anesthesia Checklist: Patient identified, Emergency Drugs available, Suction available and Patient being monitored Patient Re-evaluated:Patient Re-evaluated prior to induction Oxygen Delivery Method: Circle system utilized Preoxygenation: Pre-oxygenation with 100% oxygen Induction Type: IV induction Ventilation: Mask ventilation without difficulty Laryngoscope Size: McGraph and 3 Grade View: Grade I Tube type: Oral Number of attempts: 1 Airway Equipment and Method: Stylet and Oral airway Placement Confirmation: ETT inserted through vocal cords under direct vision,  positive ETCO2,  breath sounds checked- equal and bilateral and CO2 detector Secured at: 21 cm Tube secured with: Tape Dental Injury: Teeth and Oropharynx as per pre-operative assessment        

## 2021-03-15 NOTE — Anesthesia Preprocedure Evaluation (Signed)
Anesthesia Evaluation  Patient identified by MRN, date of birth, ID band Patient awake    Reviewed: Allergy & Precautions, NPO status , Patient's Chart, lab work & pertinent test results  History of Anesthesia Complications Negative for: history of anesthetic complications  Airway Mallampati: II       Dental   Pulmonary neg sleep apnea, COPD,  COPD inhaler, Not current smoker,           Cardiovascular (-) hypertension(-) Past MI and (-) CHF (-) dysrhythmias (-) Valvular Problems/Murmurs     Neuro/Psych neg Seizures    GI/Hepatic Neg liver ROS, neg GERD  ,  Endo/Other  neg diabetes  Renal/GU negative Renal ROS     Musculoskeletal   Abdominal   Peds  Hematology   Anesthesia Other Findings   Reproductive/Obstetrics                             Anesthesia Physical Anesthesia Plan  ASA: II  Anesthesia Plan: General   Post-op Pain Management:    Induction: Intravenous  PONV Risk Score and Plan: 2 and Ondansetron and Dexamethasone  Airway Management Planned: Oral ETT  Additional Equipment:   Intra-op Plan:   Post-operative Plan:   Informed Consent: I have reviewed the patients History and Physical, chart, labs and discussed the procedure including the risks, benefits and alternatives for the proposed anesthesia with the patient or authorized representative who has indicated his/her understanding and acceptance.       Plan Discussed with:   Anesthesia Plan Comments:         Anesthesia Quick Evaluation

## 2021-03-15 NOTE — Discharge Instructions (Addendum)
Flexible Bronchoscopy  Flexible bronchoscopy is a procedure used to examine the passageways in the lungs. During the procedure, a thin, flexible tool with a camera (bronchoscope) is passed into the mouth or nose, down through the windpipe (trachea), and into the air tubes in the lungs (bronchi). This tool allows the health care provider to look inside the lungs and to take samples for testing, if needed. Tell a health care provider about:  Any allergies you have.  All medicines you are taking, including vitamins, herbs, eye drops, creams, and over-the-counter medicines.  Any problems you or family members have had with anesthetic medicines.  Any blood disorders you have.  Any surgeries you have had.  Any medical conditions you have.  Whether you are pregnant or may be pregnant. What are the risks? Generally, this is a safe procedure. However, problems may occur, including:  Infection.  Bleeding.  Damage to other structures or organs.  Allergic reactions to medicines.  Collapsed lung (pneumothorax).  Increased need for oxygen or difficulty breathing after the procedure. What happens before the procedure? Staying hydrated Follow instructions from your health care provider about hydration, which may include:  Up to 2 hours before the procedure - you may continue to drink clear liquids, such as water, clear fruit juice, black coffee, and plain tea.   Eating and drinking restrictions Follow instructions from your health care provider about eating and drinking, which may include:  8 hours before the procedure - stop eating heavy meals or foods, such as meat, fried foods, or fatty foods.  6 hours before the procedure - stop eating light meals or foods, such as toast or cereal.  6 hours before the procedure - stop drinking milk or drinks that contain milk.  2 hours before the procedure - stop drinking clear liquids. Medicines Ask your health care provider about:  Changing or  stopping your regular medicines. This is especially important if you are taking diabetes medicines or blood thinners.  Taking medicines such as aspirin and ibuprofen. These medicines can thin your blood. Do not take these medicines unless your health care provider tells you to take them.  Taking over-the-counter medicines, vitamins, herbs, and supplements. General instructions  You may be given antibiotic medicine to help lower the risk of infection.  Plan to have a responsible adult take you home from the hospital or clinic.  If you will be going home right after the procedure, plan to have a responsible adult care for you for the time you are told. This is important. What happens during the procedure?  An IV will be inserted into one of your veins.  You will be given a medicine (local anesthetic) to numb your mouth, nose, throat, and voice box (larynx). You may also be given one or more of the following: ? A medicine to help you relax (sedative). ? A medicine to control coughing. ? A medicine to dry up any fluids or secretions in your lungs.  A bronchoscope will be passed into your nose or mouth, and into your lungs. Your health care provider will examine your lungs.  Samples of airway secretions may be collected for testing.  If abnormal areas are seen in your airways, samples of tissue may be removed and checked under a microscope (biopsy).  If tissue samples are needed from the outer parts of the lung, a type of X-ray (fluoroscopy) may be used to guide the bronchoscope to these areas.  If bleeding occurs, you may be given medicine to  stop or decrease the bleeding. The procedure may vary among health care providers and hospitals. What can I expect after the procedure?  Your blood pressure, heart rate, breathing rate, and blood oxygen level will be monitored until you leave the hospital or clinic.  You may have a chest X-ray to check for signs of pneumothorax.  You willnot be  allowed to eat or drink anything for 2 hours after your procedure.  If a biopsy was taken, it is up to you to get the results of the test. Ask your health care provider, or the department that is doing the procedure, when your results will be ready.  You may have the following symptoms for 24-48 hours: ? A cough that is worse than it was before the procedure. ? A low-grade fever. ? A sore throat or hoarse voice. ? Some blood in the mucus from your lungs (sputum), if a biopsy was done. Follow these instructions at home: Eating and drinking  Do not eat or drink anything, including water, for 2 hours after your procedure, or until your numbing medicine has worn off. Having a numb throat increases your risk of burning yourself or choking.  Start eating soft foods and slowly drinking liquids after your numbness is gone and your cough and gag reflexes have returned.  You may return to your normal diet the day after the procedure. Driving  If you were given a sedative during the procedure, it can affect you for several hours. Do not drive or operate machinery until your health care provider says that it is safe.  Ask your health care provider if the medicine prescribed to you requires you to avoid driving or using machinery.  Return to your normal activities as told by your health care provider. Ask your health care provider what activities are safe for you. General instructions  Take over-the-counter and prescription medicines only as told by your health care provider.  Do not use any products that contain nicotine or tobacco. These products include cigarettes, chewing tobacco, and vaping devices, such as e-cigarettes. If you need help quitting, ask your health care provider.  Keep all follow-up visits. This is important.   Get help right away if:  You have shortness of breath that gets worse.  You become light-headed or feel like you might faint.  You have chest pain.  You cough up  more than a small amount of blood. These symptoms may represent a serious problem that is an emergency. Do not wait to see if the symptoms will go away. Get medical help right away. Call your local emergency services (911 in the [place] Do not drive yourself to the hospital. Summary  Flexible bronchoscopy is a procedure that allows your health care provider to look closely inside your lungs and to take testing samples if needed.  Risks of flexible bronchoscopy include bleeding, infection, and collapsed lung (pneumothorax).  Before the procedure, you will be given a medicine to numb your mouth, nose, throat, and voice box. Then, a bronchoscope will be passed into your nose or mouth, and into your lungs.  After the procedure, your blood pressure, heart rate, breathing rate, and blood oxygen level will be monitored until you leave the hospital or clinic. You may have a chest X-ray to check for signs of pneumothorax.  You will not be allowed to eat or drink anything for 2 hours after your procedure. This information is not intended to replace advice given to you by your health care provider.  Make sure you discuss any questions you have with your health care provider. Document Revised: 04/21/2020 Document Reviewed: 04/21/2020 Elsevier Patient Education  2021 [place]   1) The drugs that you were given will stay in your system until tomorrow so for the next 24 hours you should not:  A) Drive an automobile B) Make any legal decisions C) Drink any alcoholic beverage   2) You may resume regular meals tomorrow.  Today it is better to start with liquids and gradually work up to solid foods.  You may eat anything you prefer, but it is better to start with liquids, then soup and crackers, and gradually work up to solid foods.   3) Please notify your doctor immediately if you have any unusual bleeding, trouble breathing, redness and pain at the  surgery site, drainage, fever, or pain not relieved by medication.    4) Additional Instructions:    Please contact your physician with any problems or Same Day Surgery at [phone], Monday through Friday 6 am to 4 pm, or [facility] number at [phone].

## 2021-03-15 NOTE — H&P (Signed)
Pulmonary Medicine          Date: 03/15/2021,   MRN# [id number] [name] 1981/10/14     AdmissionWeight: 98 kg                 CurrentWeight: 98 kg   Referring physician: Dr Raul Del   CHIEF COMPLAINT:   Left lung mass with consolidated infiltrate   HISTORY OF PRESENT ILLNESS   This is a very pleasant 40 year old male with a history of dyspnea, GERD, Wolff-Parkinson-White syndrome.  He had worsening shortness of breath with cough which is lasting now 2 to 3 months.  He also reports having hemoptysis which is nonmassive and mostly phlegm with streaks of blood on expectoration.  He has a history of COPD however denies ever having smoked in the past.  He also has on his right hand severe onychomycosis suggestive of fungal infection.  He has CT chest imaging with miliary appearance micronodular pattern bilaterally and a consolidated infiltrate involving the left lung worse at the left lower lobe superior segment which is refractory to antibiotics x3.  He does not have constitutional symptoms however does have a family history of lung cancer in his father.  His current CT scan which was done June 1 repeat from May 12 shows similar findings as previous with mild hilar and mediastinal lymphadenopathy.  We discussed all this and plan for today is to perform bronchoscopy with navigational component as well as EBUS to evaluate metastatic lesions of the hilar and mediastinal lymph node stations.   PAST MEDICAL HISTORY   Past Medical History:  Diagnosis Date  . Dyspnea   . Heartburn   . Pneumonia   . Wolff-Parkinson-White (WPW) 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     SURGICAL HISTORY   Past Surgical History:  Procedure Laterality Date  . FRACTURE SURGERY     broke femur when he was 6 years     FAMILY HISTORY   History reviewed. No pertinent family history.   SOCIAL HISTORY   Social History   Tobacco Use  . Smoking status: Never Smoker  . Smokeless tobacco: Current  User    Types: Snuff, Chew  Vaping Use  . Vaping Use: Never used  Substance Use Topics  . Alcohol use: Never  . Drug use: Never     MEDICATIONS    Home Medication:    Current Medication:  Current Facility-Administered Medications:  .  lactated ringers infusion, , Intravenous, Continuous, Arita Miss, MD, Last Rate: 10 mL/hr at 03/15/21 1152, New Bag at 03/15/21 1152    ALLERGIES   Codeine     REVIEW OF SYSTEMS    Review of Systems:  Gen:  Denies  fever, sweats, chills weigh loss  HEENT: Denies blurred vision, double vision, ear pain, eye pain, hearing loss, nose bleeds, sore throat Cardiac:  No dizziness, chest pain or heaviness, chest tightness,edema Resp:   Admits to hemoptysis and cough Gi: Denies swallowing difficulty, stomach pain, nausea or vomiting, diarrhea, constipation, bowel incontinence Gu:  Denies bladder incontinence, burning urine Ext:   Denies Joint pain, stiffness or swelling Skin: Denies  skin rash, easy bruising or bleeding or hives Endoc:  Denies polyuria, polydipsia , polyphagia or weight change Psych:   Denies depression, insomnia or hallucinations   Other:  All other systems negative   VS: BP (!) 129/93   Pulse 78   Temp 98.8 F (37.1 C) (Temporal)   Resp 18   Ht 5\' 10"  (1.778  m)   Wt 98 kg   SpO2 99%   BMI 30.99 kg/m      PHYSICAL EXAM    GENERAL:NAD, no fevers, chills, no weakness no fatigue HEAD: Normocephalic, atraumatic.  EYES: Pupils equal, round, reactive to light. Extraocular muscles intact. No scleral icterus.  MOUTH: Moist mucosal membrane. Dentition intact. No abscess noted.  EAR, NOSE, THROAT: Clear without exudates. No external lesions.  NECK: Supple. No thyromegaly. No nodules. No JVD.  PULMONARY: Coarse breath sounds worse at the left lower lung zone CARDIOVASCULAR: S1 and S2. Regular rate and rhythm. No murmurs, rubs, or gallops. No edema. Pedal pulses 2+ bilaterally.  GASTROINTESTINAL: Soft, nontender,  nondistended. No masses. Positive bowel sounds. No hepatosplenomegaly.  MUSCULOSKELETAL: No swelling, clubbing, or edema. Range of motion full in all extremities.  NEUROLOGIC: Cranial nerves II through XII are intact. No gross focal neurological deficits. Sensation intact. Reflexes intact.  SKIN: No ulceration, lesions, rashes, or cyanosis. Skin warm and dry. Turgor intact.  PSYCHIATRIC: Mood, affect within normal limits. The patient is awake, alert and oriented x 3. Insight, judgment intact.       IMAGING    CT CHEST WO CONTRAST  Result Date: 02/23/2021 CLINICAL DATA:  Cough. Completed antibiotic therapy four days prior for reported pneumonia. EXAM: CT CHEST WITHOUT CONTRAST TECHNIQUE: Multidetector CT imaging of the chest was performed following the standard protocol without IV contrast. COMPARISON:  Report from prior chest radiographs without available images, most recent 02/08/2021. FINDINGS: Cardiovascular: Normal heart size. No significant pericardial effusion/thickening. Great vessels are normal in course and caliber. Mediastinum/Nodes: No discrete thyroid nodules. Unremarkable esophagus. No axillary adenopathy. Mildly enlarged 1.0 cm short axis diameter subcarinal node (series 2/image 71). Mildly enlarged 1.0 cm right paratracheal node (series 2/image 57). No discrete hilar adenopathy on these noncontrast images. Lungs/Pleura: No pneumothorax. Small dependent left pleural effusion. No right pleural effusion. Dense patchy consolidation replacing much of the left lower lobe, appearing masslike in the superior segment left lower lobe measuring 6.1 x 4.0 cm (series 8/image 74) with associated bulging of left major fissure, with associated left lower lobe volume loss. Scattered asymmetric interlobular septal thickening throughout the left upper lobe. Fine nodularity throughout both lungs with an upper lobe predominance, appearing predominantly centrilobular. Scattered nodular foci of  peribronchovascular consolidation in left upper lobe, largest 7 mm (series 2/image 56). Upper abdomen: Patchy nodular fat stranding throughout the anterior left upper quadrant peritoneal fat (series 2/image 163). Musculoskeletal: No aggressive appearing focal osseous lesions. Subacute healing lateral right sixth rib fracture. IMPRESSION: 1. Patchy nodular fat stranding throughout the anterior left upper quadrant peritoneal fat, nonspecific, cannot exclude peritoneal carcinomatosis. Dedicated CT abdomen/pelvis with oral and IV contrast recommended for further evaluation. 2. Dense patchy consolidation replacing much of the left lower lung lobe, appearing masslike in the superior segment left lower lobe, with associated bulging of the left major fissure and associated left lower lobe volume loss. Fine nodularity throughout both lungs with an upper lobe predominance. Asymmetric left upper lobe interlobular septal thickening. These findings are indeterminate, with differential including multilobar pneumonia, sarcoidosis or a neoplastic process. The persistence on radiographs back to 01/20/2021 despite antibiotic therapy make sarcoidosis or a neoplastic process more likely. Pulmonology consultation suggested for consideration of bronchoscopic evaluation. 3. Small dependent left pleural effusion. 4. Mild mediastinal lymphadenopathy, nonspecific. 5. Subacute healing lateral right sixth rib fracture. These results will be called to the ordering clinician or representative by the Radiologist Assistant, and communication documented in the PACS or [facility]. Electronically Signed   By: Ilona Sorrel M.D.   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 -No new finding except for  expectoration of nonmassive hemoptysis  -    -Reviewed risks/complications and benefits with patient, risks include infection, pneumothorax/pneumomediastinum which may require chest tube placement as well as overnight/prolonged hospitalization and possible mechanical ventilation. Other risks include bleeding and very rarely death.  Patient understands risks and wishes to proceed.  Additional questions were answered, and patient is aware that post procedure patient will be going home with family and may experience cough with possible clots on expectoration as well as phlegm which may last few days as well as hoarseness of voice post intubation and mechanical ventilation.  Thank you for allowing me to participate in the care of this patient.  Total face to face encounter time for this patient visit was 66min. >50% of the time was  spent in counseling and coordination of care.   Patient/Family are satisfied with care plan and all questions have been answered.  This document was prepared using Dragon voice recognition software and may include unintentional dictation errors.     Ottie Glazier, M.D.  Division of [facility]

## 2021-03-16 ENCOUNTER — Encounter: Payer: Self-pay | Admitting: Pulmonary Disease

## 2021-03-17 ENCOUNTER — Other Ambulatory Visit: Payer: Self-pay | Admitting: Pathology

## 2021-03-17 LAB — CYTOLOGY - NON PAP

## 2021-03-17 LAB — ACID FAST SMEAR (AFB, MYCOBACTERIA): Acid Fast Smear: NEGATIVE

## 2021-03-17 LAB — SURGICAL PATHOLOGY

## 2021-03-18 LAB — CULTURE, BAL-QUANTITATIVE W GRAM STAIN: Culture: NO GROWTH

## 2021-03-21 ENCOUNTER — Other Ambulatory Visit: Payer: Self-pay | Admitting: Specialist

## 2021-03-21 ENCOUNTER — Other Ambulatory Visit (HOSPITAL_COMMUNITY): Payer: Self-pay | Admitting: Specialist

## 2021-03-21 DIAGNOSIS — C3490 Malignant neoplasm of unspecified part of unspecified bronchus or lung: Secondary | ICD-10-CM

## 2021-03-23 ENCOUNTER — Telehealth (INDEPENDENT_AMBULATORY_CARE_PROVIDER_SITE_OTHER): Payer: Self-pay

## 2021-03-23 ENCOUNTER — Other Ambulatory Visit: Payer: Self-pay

## 2021-03-23 ENCOUNTER — Encounter: Payer: Self-pay | Admitting: Internal Medicine

## 2021-03-23 ENCOUNTER — Inpatient Hospital Stay: Payer: Managed Care, Other (non HMO) | Attending: Internal Medicine | Admitting: Internal Medicine

## 2021-03-23 ENCOUNTER — Encounter: Payer: Self-pay | Admitting: *Deleted

## 2021-03-23 ENCOUNTER — Inpatient Hospital Stay: Payer: Managed Care, Other (non HMO)

## 2021-03-23 VITALS — BP 130/86 | HR 94 | Temp 97.8°F | Wt 204.3 lb

## 2021-03-23 DIAGNOSIS — C3432 Malignant neoplasm of lower lobe, left bronchus or lung: Secondary | ICD-10-CM

## 2021-03-23 DIAGNOSIS — C3490 Malignant neoplasm of unspecified part of unspecified bronchus or lung: Secondary | ICD-10-CM

## 2021-03-23 DIAGNOSIS — C7931 Secondary malignant neoplasm of brain: Secondary | ICD-10-CM | POA: Diagnosis not present

## 2021-03-23 DIAGNOSIS — R0902 Hypoxemia: Secondary | ICD-10-CM | POA: Diagnosis not present

## 2021-03-23 DIAGNOSIS — C7951 Secondary malignant neoplasm of bone: Secondary | ICD-10-CM | POA: Insufficient documentation

## 2021-03-23 DIAGNOSIS — E871 Hypo-osmolality and hyponatremia: Secondary | ICD-10-CM | POA: Insufficient documentation

## 2021-03-23 DIAGNOSIS — Z5111 Encounter for antineoplastic chemotherapy: Secondary | ICD-10-CM | POA: Insufficient documentation

## 2021-03-23 DIAGNOSIS — C786 Secondary malignant neoplasm of retroperitoneum and peritoneum: Secondary | ICD-10-CM | POA: Diagnosis not present

## 2021-03-23 DIAGNOSIS — R630 Anorexia: Secondary | ICD-10-CM | POA: Insufficient documentation

## 2021-03-23 DIAGNOSIS — R21 Rash and other nonspecific skin eruption: Secondary | ICD-10-CM | POA: Diagnosis not present

## 2021-03-23 DIAGNOSIS — J910 Malignant pleural effusion: Secondary | ICD-10-CM | POA: Insufficient documentation

## 2021-03-23 LAB — COMPREHENSIVE METABOLIC PANEL
ALT: 97 U/L — ABNORMAL HIGH (ref 0–44)
AST: 50 U/L — ABNORMAL HIGH (ref 15–41)
Albumin: 3.2 g/dL — ABNORMAL LOW (ref 3.5–5.0)
Alkaline Phosphatase: 399 U/L — ABNORMAL HIGH (ref 38–126)
Anion gap: 14 (ref 5–15)
BUN: 17 mg/dL (ref 6–20)
CO2: 24 mmol/L (ref 22–32)
Calcium: 8.7 mg/dL — ABNORMAL LOW (ref 8.9–10.3)
Chloride: 95 mmol/L — ABNORMAL LOW (ref 98–111)
Creatinine, Ser: 0.82 mg/dL (ref 0.61–1.24)
GFR, Estimated: >60 mL/min (ref >60)
Glucose, Bld: 96 mg/dL (ref 70–99)
Potassium: 4.2 mmol/L (ref 3.5–5.1)
Sodium: 133 mmol/L — ABNORMAL LOW (ref 135–145)
Total Bilirubin: 0.9 mg/dL (ref 0.3–1.2)
Total Protein: 7.1 g/dL (ref 6.5–8.1)

## 2021-03-23 MED ORDER — DEXAMETHASONE 4 MG PO TABS: ORAL_TABLET | ORAL | 0 refills | Status: DC

## 2021-03-23 MED ORDER — LIDOCAINE-PRILOCAINE 2.5-2.5 % EX CREA: 1.0000 "application " | TOPICAL_CREAM | CUTANEOUS | 0 refills | Status: DC | PRN

## 2021-03-23 MED ORDER — FOLIC ACID 1 MG PO TABS: 1.0000 mg | ORAL_TABLET | Freq: Every day | ORAL | 1 refills | Status: DC

## 2021-03-23 MED ORDER — ONDANSETRON HCL 8 MG PO TABS: ORAL_TABLET | ORAL | 1 refills | Status: DC

## 2021-03-23 MED ORDER — HYDROCOD POLST-CPM POLST ER 10-8 MG/5ML PO SUER: 5.0000 mL | Freq: Two times a day (BID) | ORAL | 0 refills | Status: DC | PRN

## 2021-03-23 MED ORDER — PROCHLORPERAZINE MALEATE 10 MG PO TABS: 10.0000 mg | ORAL_TABLET | Freq: Four times a day (QID) | ORAL | 1 refills | Status: DC | PRN

## 2021-03-23 MED ORDER — HYDROCODONE-ACETAMINOPHEN 5-325 MG PO TABS: 1.0000 | ORAL_TABLET | Freq: Four times a day (QID) | ORAL | 0 refills | Status: DC | PRN

## 2021-03-23 NOTE — Progress Notes (Signed)
Pt's wife made aware of need for port placement prior to chemo. Informed that emla cream will be sent into pharmacy and will give appt information at chemo class tomorrow. Pt's wife verbalized understanding.

## 2021-03-23 NOTE — Addendum Note (Signed)
Addended by: [name] on: 03/23/2021 02:56 PM   Modules accepted: Orders

## 2021-03-23 NOTE — Telephone Encounter (Signed)
I received a secure chat from [name] stating the patient needed a port placement ASAP. Patient was scheduled with Dr. Delana Meyer for a port placement on 03/28/21 with a 2:30 pm arrival time to the MM. Pre;-procedure instructions will be mailed to the patient and [name] will let the patient's wife know as she has to speak with her regarding other information and appt's.

## 2021-03-23 NOTE — Research (Signed)
Trial: [id number]  Patient [name] was identified by Jeral Fruit, RN as a potential candidate for the above listed study.  This Clinical Research Nurse met with [name], [id number], on 03/23/21 in a manner and location that ensures patient privacy to discuss participation in the above listed research study.  Patient is Accompanied by his wife .  A copy of the informed consent document and separate HIPAA Authorization was provided to the patient.  Patient reads, speaks, and understands [place].    Patient was provided with the business card of this Nurse and encouraged to contact the research team with any questions.  Patient was provided the option of taking informed consent documents home to review and was encouraged to review at their convenience with their support network, including other care providers. Patient is comfortable with making a decision regarding study participation today.  As outlined in the informed consent form, this Nurse and Alois Cliche discussed the purpose of the research study, the investigational nature of the study, study procedures and requirements for study participation, potential risks and benefits of study participation, as well as alternatives to participation. This study is not blinded. The patient understands participation is voluntary and they may withdraw from study participation at any time.  This study does not involve randomization.  This study does not involve an investigational drug or device. This study does not involve a placebo. Patient understands enrollment is pending full eligibility review.   Confidentiality and how the patient's information will be used as part of study participation were discussed.  Patient was informed there is reimbursement provided for their time and effort spent on trial participation.  The patient is encouraged to discuss research study participation with their insurance provider to determine what costs they may  incur as part of study participation, including research related injury.    All questions were answered to patient's satisfaction.  The informed consent and separate HIPAA Authorization was reviewed page by page.  The patient's mental and emotional status is appropriate to provide informed consent, and the patient verbalizes an understanding of study participation.  Patient has agreed to participate in the above listed research study and has voluntarily signed the informed consent version version # 1 and separate HIPAA Authorization, version 5  on 03/23/21 at 1245PM.  The patient was provided with a copy of the signed informed consent form with embedded HIPAA language for their reference.  No study specific procedures were obtained prior to the signing of the informed consent document.  Approximately +30 minutes were spent with the patient reviewing the informed consent documents.  After obtaining informed consent patient, voluntarily signed the optional Release of Information form for use throughout trial participation. The patients protocol study labs will be drawn at his next lab appointment here in the cancer center.  Jeral Fruit, RN 03/23/21 1:04 PM

## 2021-03-23 NOTE — Progress Notes (Signed)
Orders moved due to change in appointment for labs.  Jeral Fruit, RN 03/23/21 3:58 PM

## 2021-03-23 NOTE — Progress Notes (Signed)
[facility] NOTE  Patient Care Team: Pcp, No as PCP - General [name], RN as Oncology Nurse Navigator  CHIEF COMPLAINTS/PURPOSE OF CONSULTATION: lung nodule/mass  #  Oncology History Overview Note  IMPRESSION: 1. Patchy nodular fat stranding throughout the anterior left upper quadrant peritoneal fat, nonspecific, cannot exclude peritoneal carcinomatosis. Dedicated CT abdomen/pelvis with oral and IV contrast recommended for further evaluation. 2. Dense patchy consolidation replacing much of the left lower lung lobe, appearing masslike in the superior segment left lower lobe, with associated bulging of the left major fissure and associated left lower lobe volume loss. Fine nodularity throughout both lungs with an upper lobe predominance. Asymmetric left upper lobe interlobular septal thickening. These findings are indeterminate, with differential including multilobar pneumonia, sarcoidosis or a neoplastic process. The persistence on radiographs back to 01/20/2021 despite antibiotic therapy make sarcoidosis or a neoplastic process more likely. Pulmonology consultation suggested for consideration of bronchoscopic evaluation. 3. Small dependent left pleural effusion. 4. Mild mediastinal lymphadenopathy, nonspecific. 5. Subacute healing lateral right sixth rib fracture.   DIAGNOSIS:  A. LUNG, LEFT LOWER LOBE; ENB-ASSISTED BIOPSY:  - NON-SMALL CELL CARCINOMA, FAVOR ADENOCARCINOMA.  - FOREIGN MATERIAL SUGGESTIVE OF ASPIRATION.   There is sufficient material for limited ancillary studi   Cancer of lower lobe of left lung ([place])  03/23/2021 Initial Diagnosis   Cancer of lower lobe of left lung ([place])   03/27/2021 -  Chemotherapy    Patient is on Treatment Plan: LUNG NSCLC PEMETREXED (ALIMTA) / CARBOPLATIN Q21D X 1 CYCLES          HISTORY OF PRESENTING ILLNESS:  KAYLER RISE 40 y.o.  male history of smoking is here for further evaluation and  recommendations for lung cancer  Patient was treated for pneumonia in February 2022.  He was treated with 2 rounds of antibiotics.  Since symptoms did not improve/he went on to have a CAT scan and further work-up with a bronchoscopy/biopsy.  Patient complains of left anterior chest wall pain pleuritic [especially s/p bronchoscopy.].  Is currently on hydrocodone every 4 hours.  Also on benzonatate 100 mg.  Complains of poor appetite.  Possible mild weight loss.  No nausea no vomiting no headaches.   Review of Systems  Constitutional:  Positive for malaise/fatigue and weight loss. Negative for chills, diaphoresis and fever.  HENT:  Negative for nosebleeds and sore throat.   Eyes:  Negative for double vision.  Respiratory:  Positive for cough, hemoptysis, sputum production and shortness of breath. Negative for wheezing.   Cardiovascular:  Negative for chest pain, palpitations, orthopnea and leg swelling.  Gastrointestinal:  Positive for constipation. Negative for abdominal pain, blood in stool, diarrhea, heartburn, melena, nausea and vomiting.  Genitourinary:  Negative for dysuria, frequency and urgency.  Musculoskeletal:  Negative for back pain and joint pain.  Skin: Negative.  Negative for itching and rash.  Neurological:  Negative for dizziness, tingling, focal weakness, weakness and headaches.  Endo/Heme/Allergies:  Does not bruise/bleed easily.  Psychiatric/Behavioral:  Negative for depression. The patient is not nervous/anxious and does not have insomnia.     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    SURGICAL HISTORY: Past Surgical History:  Procedure Laterality Date   FRACTURE SURGERY     broke femur when he was 6 years   VIDEO BRONCHOSCOPY WITH ENDOBRONCHIAL NAVIGATION N/A 03/15/2021  Procedure: VIDEO BRONCHOSCOPY WITH ENDOBRONCHIAL NAVIGATION;  Surgeon: Ottie Glazier, MD;  Location: [facility] ORS;  Service: Thoracic;  Laterality: N/A;   VIDEO BRONCHOSCOPY WITH ENDOBRONCHIAL ULTRASOUND N/A 03/15/2021   Procedure: VIDEO BRONCHOSCOPY WITH ENDOBRONCHIAL ULTRASOUND;  Surgeon: Ottie Glazier, MD;  Location: [facility] ORS;  Service: Thoracic;  Laterality: N/A;    SOCIAL HISTORY: Social History   Socioeconomic History   Marital status: Married    Spouse name: [name]   Number of children: 2   Years of education: Not on file   Highest education level: Not on file  Occupational History   Not on file  Tobacco Use   Smoking status: Never   Smokeless tobacco: Current    Types: Snuff, Chew  Vaping Use   Vaping Use: Never used  Substance and Sexual Activity   Alcohol use: Never   Drug use: Never   Sexual activity: Not on file  Other Topics Concern   Not on file  Social History Narrative   [place]; with wife; 2 daughters[12 and 22]; never smoked; rare alcohol. Work in [employer]    Social Determinants of Health   Financial Resource Strain: Not on file  Food Insecurity: Not on file  Transportation Needs: Not on file  Physical Activity: Not on file  Stress: Not on file  Social Connections: Not on file  Intimate Partner Violence: Not on file    FAMILY HISTORY: Family History  Problem Relation Age of Onset   High Cholesterol Mother    Hypertension Father    Cancer Father    Lung cancer Father    Skin cancer Father     ALLERGIES:  is allergic to codeine.  MEDICATIONS:  Current Outpatient Medications  Medication Sig Dispense Refill   amoxicillin-clavulanate (AUGMENTIN) 500-125 MG tablet Take 1 tablet by mouth in the morning and at bedtime.     chlorpheniramine-HYDROcodone (TUSSIONEX) 10-8 MG/5ML SUER Take 5 mLs by mouth every 12 (twelve) hours as needed for cough. 140 mL 0   dexamethasone (DECADRON) 4 MG tablet Take one pill AM & PM for 2 days. TAKE day prior & day AFTER chemo. Do NOT take on day of chemo. 60 tablet 0   folic acid  (FOLVITE) 1 MG tablet Take 1 tablet (1 mg total) by mouth daily. 90 tablet 1   HYDROcodone-acetaminophen (NORCO/VICODIN) 5-325 MG tablet Take 1 tablet by mouth every 6 (six) hours as needed for moderate pain. 90 tablet 0   ondansetron (ZOFRAN) 8 MG tablet One pill every 8 hours as needed for nausea/vomitting. 40 tablet 1   prochlorperazine (COMPAZINE) 10 MG tablet Take 1 tablet (10 mg total) by mouth every 6 (six) hours as needed for nausea or vomiting. 40 tablet 1   lidocaine-prilocaine (EMLA) cream Apply 1 application topically as needed. 30 g 0   No current facility-administered medications for this visit.      Marland Kitchen  PHYSICAL EXAMINATION: ECOG PERFORMANCE STATUS: 1 - Symptomatic but completely ambulatory  Vitals:   03/23/21 1109  BP: 130/86  Pulse: 94  Temp: 97.8 F (36.6 C)  SpO2: 98%   Filed Weights   03/23/21 1109  Weight: 204 lb 4.8 oz (92.7 kg)    Physical Exam Constitutional:      Comments: Patient is wheelchair because of pain.  With his wife.  HENT:     Head: Normocephalic and atraumatic.     Mouth/Throat:     Pharynx: No oropharyngeal exudate.  Eyes:     Pupils: Pupils  are equal, round, and reactive to light.  Cardiovascular:     Rate and Rhythm: Normal rate and regular rhythm.  Pulmonary:     Effort: No respiratory distress.     Breath sounds: No wheezing.     Comments: Decreased breath sounds bilaterally at bases.  No wheeze or crackles Abdominal:     General: Bowel sounds are normal. There is no distension.     Palpations: Abdomen is soft. There is no mass.     Tenderness: There is no abdominal tenderness. There is no guarding or rebound.  Musculoskeletal:        General: No tenderness. Normal range of motion.     Cervical back: Normal range of motion and neck supple.  Skin:    General: Skin is warm.  Neurological:     Mental Status: He is alert and oriented to person, place, and time.  Psychiatric:        Mood and Affect: Affect normal.      LABORATORY DATA:  I have reviewed the data as listed Lab Results  Component Value Date   WBC 10.8 (H) 03/10/2021   HGB 15.1 03/10/2021   HCT 44.1 03/10/2021   MCV 92.3 03/10/2021   PLT 342 03/10/2021   Recent Labs    03/23/21 1233  NA 133*  K 4.2  CL 95*  CO2 24  GLUCOSE 96  BUN 17  CREATININE 0.82  CALCIUM 8.7*  GFRNONAA >60  PROT 7.1  ALBUMIN 3.2*  AST 50*  ALT 97*  ALKPHOS 399*  BILITOT 0.9    RADIOGRAPHIC STUDIES: I have personally reviewed the radiological images as listed and agreed with the findings in the report. DG Chest Port 1 View  Result Date: 03/15/2021 CLINICAL DATA:  Post bronchoscopy on the left EXAM: PORTABLE CHEST 1 VIEW COMPARISON:  CT chest 03/15/2021 FINDINGS: Mild to moderate left pleural effusion. Left lower lobe consolidation. No pneumothorax. Mild right lower lobe airspace disease appears new from the CT today. There is mild vascular congestion which may be due to mild fluid overload IMPRESSION: No pneumothorax post bronchoscopy Persistent left pleural effusion and left lower lobe consolidation/mass Progression of right lower lobe airspace disease Pulmonary vascular  congestion.  Question fluid overload Electronically Signed   By: Franchot Gallo M.D.   On: 03/15/2021 15:55   DG C-Arm 1-60 Min-No Report  Result Date: 03/15/2021 Fluoroscopy was utilized by the requesting physician.  No radiographic interpretation.   CT Super D Chest Wo Contrast  Result Date: 03/15/2021 CLINICAL DATA:  Recent pneumonia.  Cough. EXAM: CT CHEST WITHOUT CONTRAST TECHNIQUE: Multidetector CT imaging of the chest was performed using thin slice collimation for electromagnetic bronchoscopy planning purposes, without intravenous contrast. COMPARISON:  02/23/2021 FINDINGS: Cardiovascular: The heart size is normal. No substantial pericardial effusion. No thoracic aortic aneurysm. Mediastinum/Nodes: 10 mm short axis right paratracheal node evident. 10 mm short axis subcarinal  lymph node associated. No definite right hilar lymphadenopathy on this noncontrast study. Soft tissue fullness noted in the left hilum although assessment limited by lack of intravenous contrast material. The esophagus has normal imaging features. There is no axillary lymphadenopathy. Lungs/Pleura: Diffuse micro nodularity again noted bilaterally with an upper lung predominance. The collapse/consolidative change in the left lower lobe is similar to prior with 6.1 x 4.0 cm measured masslike consolidative opacity stable at 6.1 x 4.5 cm today. Small left pleural effusion evident. Upper Abdomen: Nodular soft tissue attenuation in the left upper quadrant/omental region is again noted. Musculoskeletal: 11  mm lucency is identified in the T10 vertebral body (43/2) IMPRESSION: 1. No substantial interval change in the dense consolidative opacity involving the left lower lobe with associated small left pleural effusion. 2. Bilateral relatively symmetric micro nodularity demonstrates an upper lobe predominance, unchanged. This may be infectious/inflammatory in etiology. 3. Similar appearance of the ill-defined nodular soft tissue involving the anterior left upper quadrant, likely omental. Given that metastatic disease could certainly have this appearance, dedicated abdomen/pelvis CT with oral and intravenous contrast recommended to further evaluate. Electronically Signed   By: Misty Stanley M.D.   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  Check PET scan ASAP [? Stage III].  Patient is a non-smoker-recommend EGFR/ALK/Ros-1 mutation testing.  QNS-PDL-1.  Recommend MRI brain stat for further evaluation.  #Recommend starting immediate chemotherapy with carbo Alimta.  Await NGS testing/PET scan to consider adding  Keytruda.  Discussed the potential side effects including but not limited to-increasing fatigue, nausea vomiting, diarrhea, hair loss, sores in the mouth, increase risk of infection and also neuropathy.   # Cough/chest pain-secondary to malignancy; refill hydrocodone Tussionex.  # WPW- [last 20 years; lanxosin- > 25 years not seen cardiologist] clinically stable.  No cardiotoxic medications noted.  # Thank you Dr. Frankey Poot for allowing me to participate in the care of your pleasant patient. Please do not hesitate to contact me with questions or concerns in the interim.   # DISPOSITION: # labs today- cbc/cmp/cea # chemo education ASAP; B12 shot # MRI brain STAT # follow up early next week-MD; No labs; Carbo-alimta-Dr.B   All questions were answered. The patient knows to call the clinic with any problems, questions or concerns.    [name], MD 03/27/2021 1:07 PM

## 2021-03-23 NOTE — Progress Notes (Signed)
Met with patient during initial consult with Dr. Rogue Bussing to discuss recent pathology results and next steps. All questions answered during visit. Pt given resources regarding diagnosis and supportive services available. Reviewed upcoming appts. Per PET dept, the earliest pt can come in for PET scan is Monday 6/13 as he is scheduled. Pt and his wife made aware. Informed that will be called with his appt for brain MRI once approved through insurance. Contact info given and instructed to call with any further questions or needs. Pt and his wife verbalized understanding. Nothing further needed at this time.  Foundation One has been ordered per Dr. Jacinto Reap.

## 2021-03-23 NOTE — Addendum Note (Signed)
Addended by: [name] on: 03/23/2021 03:33 PM   Modules accepted: Orders

## 2021-03-23 NOTE — Assessment & Plan Note (Addendum)
#  Left upper lobe lung cancer-adenocarcinoma the lung-highly suggestive of stage IV based on [based on pleural effusion/contralateral right upper lobe nodules].  Check PET scan ASAP [? Stage III].  Patient is a non-smoker-recommend EGFR/ALK/Ros-1 mutation testing.  QNS-PDL-1.  Recommend MRI brain stat for further evaluation.  #Recommend starting immediate chemotherapy with carbo Alimta.  Await NGS testing/PET scan to consider adding Keytruda.  Discussed the potential side effects including but not limited to-increasing fatigue, nausea vomiting, diarrhea, hair loss, sores in the mouth, increase risk of infection and also neuropathy.   # Cough/chest pain-secondary to malignancy; refill hydrocodone Tussionex.  # WPW- [last 20 years; lanxosin- > 25 years not seen cardiologist] clinically stable.  No cardiotoxic medications noted.  # Thank you Dr. Frankey Poot for allowing me to participate in the care of your pleasant patient. Please do not hesitate to contact me with questions or concerns in the interim.   # DISPOSITION: # labs today- cbc/cmp/cea # chemo education ASAP; B12 shot # MRI brain STAT # follow up early next week-MD; No labs; Carbo-alimta-Dr.B

## 2021-03-24 ENCOUNTER — Other Ambulatory Visit: Payer: Self-pay

## 2021-03-24 ENCOUNTER — Inpatient Hospital Stay: Payer: Managed Care, Other (non HMO)

## 2021-03-24 ENCOUNTER — Other Ambulatory Visit: Payer: Self-pay | Admitting: Nurse Practitioner

## 2021-03-24 ENCOUNTER — Encounter: Payer: Self-pay | Admitting: *Deleted

## 2021-03-24 ENCOUNTER — Other Ambulatory Visit: Payer: Self-pay | Admitting: Specialist

## 2021-03-24 DIAGNOSIS — E538 Deficiency of other specified B group vitamins: Secondary | ICD-10-CM

## 2021-03-24 DIAGNOSIS — Z5111 Encounter for antineoplastic chemotherapy: Secondary | ICD-10-CM | POA: Diagnosis not present

## 2021-03-24 DIAGNOSIS — J90 Pleural effusion, not elsewhere classified: Secondary | ICD-10-CM

## 2021-03-24 LAB — CEA: CEA: 9.8 ng/mL — ABNORMAL HIGH (ref 0.0–4.7)

## 2021-03-24 MED ORDER — CYANOCOBALAMIN 1000 MCG/ML IJ SOLN: 1000.0000 ug | Freq: Once | INTRAMUSCULAR | Status: DC

## 2021-03-24 MED ORDER — CYANOCOBALAMIN 1000 MCG/ML IJ SOLN
1000.0000 ug | Freq: Once | INTRAMUSCULAR | Status: AC
Start: 2021-03-24 — End: 2021-03-24
  Administered 2021-03-24: 1000 ug via INTRAMUSCULAR
  Filled 2021-03-24: qty 1

## 2021-03-27 ENCOUNTER — Ambulatory Visit
Admission: RE | Admit: 2021-03-27 | Discharge: 2021-03-27 | Disposition: A | Discharge status: Home / Self Care | Payer: Managed Care, Other (non HMO) | Source: Ambulatory Visit | Attending: Specialist | Admitting: Specialist

## 2021-03-27 ENCOUNTER — Encounter: Payer: Self-pay | Admitting: Internal Medicine

## 2021-03-27 ENCOUNTER — Other Ambulatory Visit (INDEPENDENT_AMBULATORY_CARE_PROVIDER_SITE_OTHER): Payer: Self-pay | Admitting: Nurse Practitioner

## 2021-03-27 ENCOUNTER — Other Ambulatory Visit: Payer: Self-pay | Admitting: Internal Medicine

## 2021-03-27 ENCOUNTER — Other Ambulatory Visit: Payer: Self-pay

## 2021-03-27 ENCOUNTER — Encounter: Payer: Self-pay | Admitting: Nurse Practitioner

## 2021-03-27 DIAGNOSIS — R590 Localized enlarged lymph nodes: Secondary | ICD-10-CM | POA: Diagnosis present

## 2021-03-27 DIAGNOSIS — C3490 Malignant neoplasm of unspecified part of unspecified bronchus or lung: Secondary | ICD-10-CM

## 2021-03-27 DIAGNOSIS — C7951 Secondary malignant neoplasm of bone: Secondary | ICD-10-CM | POA: Insufficient documentation

## 2021-03-27 DIAGNOSIS — J910 Malignant pleural effusion: Secondary | ICD-10-CM | POA: Insufficient documentation

## 2021-03-27 DIAGNOSIS — C3432 Malignant neoplasm of lower lobe, left bronchus or lung: Secondary | ICD-10-CM | POA: Diagnosis not present

## 2021-03-27 DIAGNOSIS — C786 Secondary malignant neoplasm of retroperitoneum and peritoneum: Secondary | ICD-10-CM | POA: Diagnosis not present

## 2021-03-27 LAB — GLUCOSE, CAPILLARY: Glucose-Capillary: 90 mg/dL (ref 70–99)

## 2021-03-27 IMAGING — CT NM PET TUM IMG INITIAL (PI) SKULL BASE T - THIGH
1 of 10 series · 1 of 25 positions shown · non-contrast
Comparison: [DATE]

CLINICAL DATA: Initial treatment strategy for non-small cell lung
carcinoma.

EXAM:
NUCLEAR MEDICINE PET SKULL BASE TO THIGH
TECHNIQUE: 10.7 mCi F-18 FDG was injected intravenously. Full-ring PET imaging
was performed from the skull base to thigh after the radiotracer. CT
data was obtained and used for attenuation correction and anatomic
localization.
Fasting blood glucose: 90 mg/dl

[Series 3: ct wb 5.0 b30f · axial · 5.0mm · 0.98mm/px · 1 of 290 slices shown]
[im 290/290  brain]
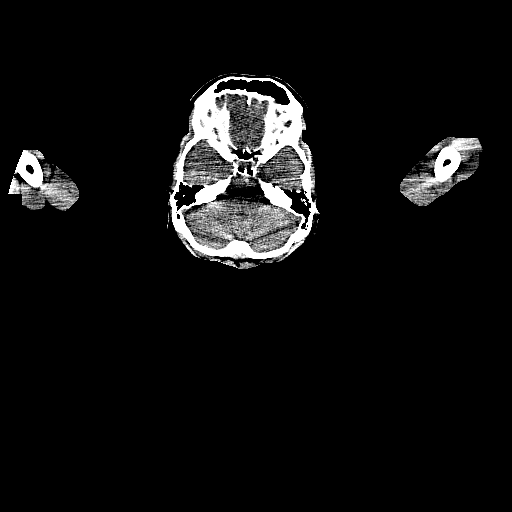

[1 of 25 positions shown; findings below may reference images not displayed]

FINDINGS: Mediastinal blood-pool activity (background): SUV max =

Liver activity (reference): SUV max = N/A

NECK: 6 mm left supraclavicular lymph node on image 50/3 is
hypermetabolic, with SUV max of 3.6.

Incidental CT findings:  None.

CHEST: Left lung collapse is seen with a large hypermetabolic
masslike opacity in the perihilar region and left lower lobe, which
measures approximately 7.0 x 5.8 cm on image 101/3 and has SUV max
of 17.5. Hypermetabolic activity in the collapsed left upper lobe is
likely secondary to atelectasis. Large left pleural effusion causes
mediastinal shift to the right and shows pleural base areas mild FDG
uptake, highly suspicious for malignant pleural effusion.

A 10 mm subcarinal lymph node shows FDG uptake with SUV max of 3.9.
A 10 mm right paratracheal lymph node shows FDG uptake with SUV max
of 3.9.

Incidental CT findings:  None.

ABDOMEN/PELVIS: No abnormal hypermetabolic activity within the
liver, pancreas, adrenal glands, or spleen. 1.7 cm hypermetabolic
soft tissue density in the splenic hilum has SUV max of 6.2, and may
represent a hypermetabolic lymph node or peritoneal implant.
Ill-defined soft tissue density in the left upper quadrant omental
fat is hypermetabolic with SUV max of 4.5, consistent with omental
carcinomatosis. Hypermetabolic activity is seen involving the
peritoneum in dependent pelvis, also suspicious for peritoneal
carcinomatosis. No other hypermetabolic soft tissue masses or
lymphadenopathy identified.

Incidental CT findings:  None.

SKELETON: Numerous hypermetabolic bone metastases are seen in the
bilateral scapulae, bilateral ribs, thoracic and lumbosacral spine,
and pelvis, most of which are lytic in appearance.

Incidental CT findings:  None.
IMPRESSION: Left lung collapse with 7 cm hypermetabolic masslike opacity
centered in the left lower lobe and involving the left hilum,
consistent with primary bronchogenic carcinoma.

Large malignant left pleural effusion.

Hypermetabolic lymphadenopathy in mediastinum and left
supraclavicular region.

Peritoneal/omental carcinomatosis.

Diffuse bone metastases.

## 2021-03-27 MED ORDER — FLUDEOXYGLUCOSE F - 18 (FDG) INJECTION
10.6000 | Freq: Once | INTRAVENOUS | Status: AC | PRN
  Administered 2021-03-27: 10.7 via INTRAVENOUS

## 2021-03-27 NOTE — Progress Notes (Signed)
START ON PATHWAY REGIMEN - Non-Small Cell Lung     A cycle is every 21 days:     Pemetrexed      Carboplatin   **Always confirm dose/schedule in your pharmacy ordering system**  Patient Characteristics: Stage IV Metastatic, Nonsquamous, Awaiting Molecular Test Results and Need to Start Chemotherapy, PS = 0, 1 Therapeutic Status: Stage IV Metastatic Histology: Nonsquamous Cell Broad Molecular Profiling Status: Awaiting Molecular Test Results and Need to Start Chemotherapy ECOG Performance Status: 1 Intent of Therapy: Non-Curative / Palliative Intent, Discussed with Patient

## 2021-03-27 NOTE — Progress Notes (Signed)
Foundation One cancelled due to insufficient tissue. Order for limited ancillary studies for EGFR, ALK, and ROS1 faxed to pathology.

## 2021-03-28 ENCOUNTER — Observation Stay
Admission: RE | Admit: 2021-03-28 | Discharge: 2021-03-30 | Disposition: A | Discharge status: Home / Self Care | Payer: Managed Care, Other (non HMO) | Attending: Internal Medicine | Admitting: Internal Medicine

## 2021-03-28 ENCOUNTER — Ambulatory Visit: Payer: Managed Care, Other (non HMO)

## 2021-03-28 ENCOUNTER — Observation Stay: Payer: Managed Care, Other (non HMO)

## 2021-03-28 ENCOUNTER — Encounter: Payer: Self-pay | Admitting: Vascular Surgery

## 2021-03-28 ENCOUNTER — Ambulatory Visit
Admission: RE | Admit: 2021-03-28 | Discharge: 2021-03-28 | Disposition: A | Discharge status: Home / Self Care | Payer: Managed Care, Other (non HMO) | Source: Ambulatory Visit | Attending: Specialist | Admitting: Specialist

## 2021-03-28 ENCOUNTER — Encounter
Admission: RE | Disposition: A | Discharge status: Home / Self Care | Payer: Self-pay | Source: Home / Self Care | Attending: Internal Medicine

## 2021-03-28 ENCOUNTER — Other Ambulatory Visit: Payer: Self-pay

## 2021-03-28 ENCOUNTER — Encounter: Payer: Self-pay | Admitting: *Deleted

## 2021-03-28 DIAGNOSIS — C3490 Malignant neoplasm of unspecified part of unspecified bronchus or lung: Secondary | ICD-10-CM | POA: Diagnosis not present

## 2021-03-28 DIAGNOSIS — J910 Malignant pleural effusion: Secondary | ICD-10-CM | POA: Diagnosis not present

## 2021-03-28 DIAGNOSIS — K5900 Constipation, unspecified: Secondary | ICD-10-CM | POA: Diagnosis not present

## 2021-03-28 DIAGNOSIS — I456 Pre-excitation syndrome: Secondary | ICD-10-CM | POA: Diagnosis not present

## 2021-03-28 DIAGNOSIS — C3492 Malignant neoplasm of unspecified part of left bronchus or lung: Principal | ICD-10-CM | POA: Insufficient documentation

## 2021-03-28 DIAGNOSIS — Z9889 Other specified postprocedural states: Secondary | ICD-10-CM

## 2021-03-28 DIAGNOSIS — J90 Pleural effusion, not elsewhere classified: Secondary | ICD-10-CM

## 2021-03-28 DIAGNOSIS — C7931 Secondary malignant neoplasm of brain: Secondary | ICD-10-CM

## 2021-03-28 DIAGNOSIS — C3432 Malignant neoplasm of lower lobe, left bronchus or lung: Secondary | ICD-10-CM | POA: Diagnosis present

## 2021-03-28 DIAGNOSIS — R7401 Elevation of levels of liver transaminase levels: Secondary | ICD-10-CM | POA: Diagnosis not present

## 2021-03-28 DIAGNOSIS — E871 Hypo-osmolality and hyponatremia: Secondary | ICD-10-CM | POA: Insufficient documentation

## 2021-03-28 DIAGNOSIS — I471 Supraventricular tachycardia: Secondary | ICD-10-CM | POA: Diagnosis not present

## 2021-03-28 DIAGNOSIS — Z515 Encounter for palliative care: Secondary | ICD-10-CM

## 2021-03-28 HISTORY — PX: PORTA CATH INSERTION: CATH118285

## 2021-03-28 HISTORY — DX: Malignant (primary) neoplasm, unspecified: C80.1

## 2021-03-28 LAB — LACTATE DEHYDROGENASE, PLEURAL OR PERITONEAL FLUID: LD, Fluid: 1253 U/L — ABNORMAL HIGH (ref 3–23)

## 2021-03-28 LAB — BODY FLUID CELL COUNT WITH DIFFERENTIAL
Eos, Fluid: 0 %
Lymphs, Fluid: 33 %
Monocyte-Macrophage-Serous Fluid: 36 %
Neutrophil Count, Fluid: 31 %
Total Nucleated Cell Count, Fluid: 1829 cu mm

## 2021-03-28 LAB — PROTEIN, PLEURAL OR PERITONEAL FLUID: Total protein, fluid: 4.5 g/dL

## 2021-03-28 LAB — GLUCOSE, PLEURAL OR PERITONEAL FLUID: Glucose, Fluid: 26 mg/dL

## 2021-03-28 LAB — AMYLASE, PLEURAL OR PERITONEAL FLUID: Amylase, Fluid: 280 U/L

## 2021-03-28 LAB — TSH: TSH: 1.198 u[IU]/mL (ref 0.350–4.500)

## 2021-03-28 LAB — HIV ANTIBODY (ROUTINE TESTING W REFLEX): HIV Screen 4th Generation wRfx: NONREACTIVE

## 2021-03-28 IMAGING — MR MR HEAD WO/W CM
13 series · 48 of 48 positions shown · IV contrast (9ml Gadavist)
Comparison: None.

CLINICAL DATA: Staging for lung mass

EXAM:
MRI HEAD WITHOUT AND WITH CONTRAST
TECHNIQUE: Multiplanar, multiecho pulse sequences of the brain and surrounding
structures were obtained without and with intravenous contrast.
CONTRAST:  9mL GADAVIST GADOBUTROL 1 MMOL/ML IV SOLN

[Series 5: ax dwi_tracew · axial · 3.0mm · 0.65mm/px · z∈[-83,+69]mm · 4 of 48 slices shown]
[im 1/48]
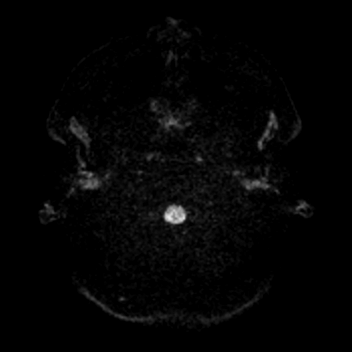
[im 16/48]
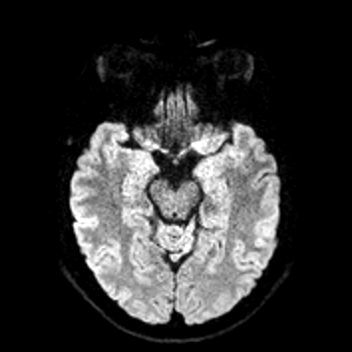
[im 32/48]
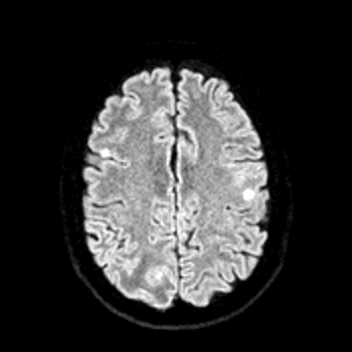
[im 48/48]
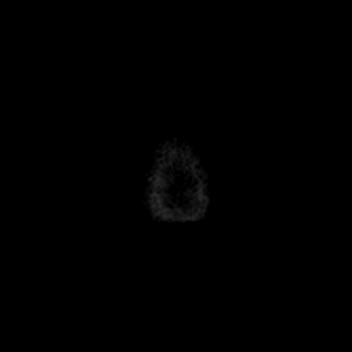

[Series 6: ax dwi_adc · axial · 3.0mm · 0.65mm/px · z∈[-83,+69]mm · 4 of 48 slices shown]
[im 1/48]
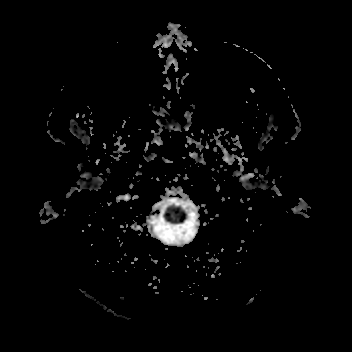
[im 16/48]
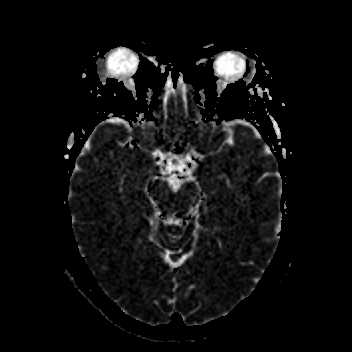
[im 32/48]
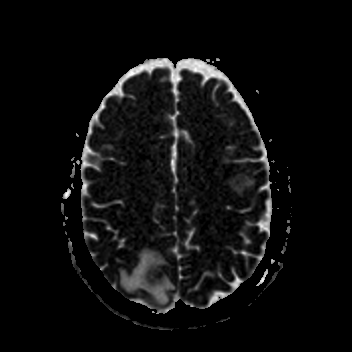
[im 48/48]
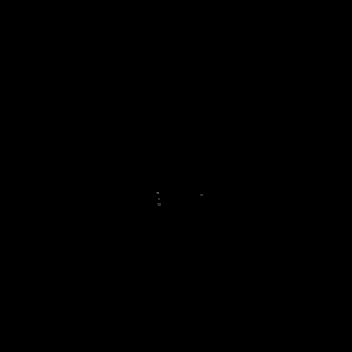

[Series 7: cor dwi_tracew · coronal · 5.0mm · 0.65mm/px · 2 of 42 slices shown]
[im 1/42]
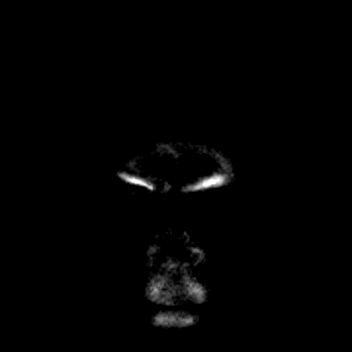
[im 42/42]
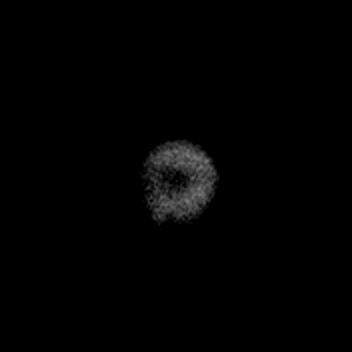

[Series 8: cor dwi_adc · coronal · 5.0mm · 0.65mm/px · 2 of 41 slices shown]
[im 1/41]
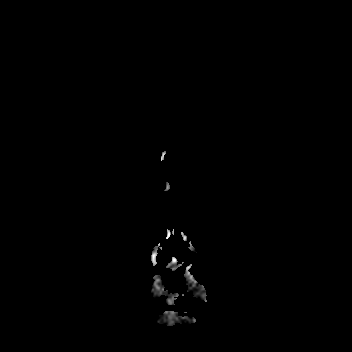
[im 41/41]
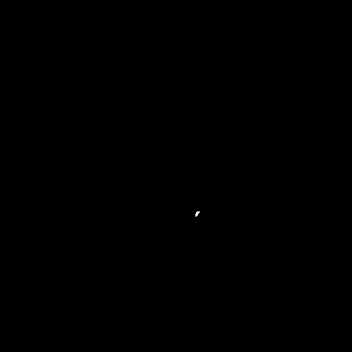

[Series 9: T1 · sagittal · 5.0mm · 0.62mm/px · 1 of 25 slices shown (1 of 2)]
[im 1/25]
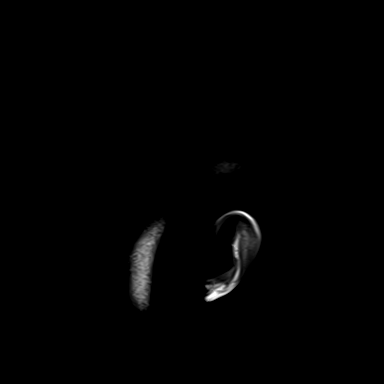

[Series 11: FLAIR · axial · 3.0mm · 0.53mm/px · z∈[-86,+73]mm · 3 of 55 slices shown]
[im 1/55]
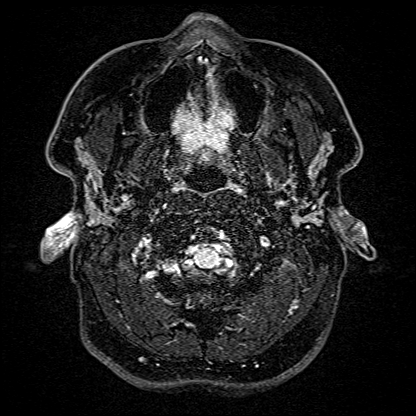
[im 28/55]
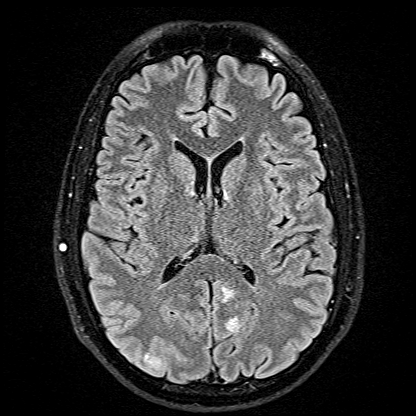
[im 55/55]
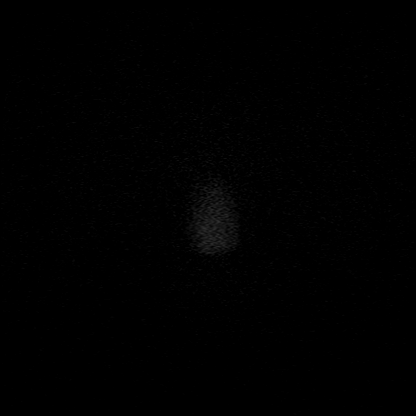

[Series 13: pha_images · axial · 3.0mm · 0.90mm/px · z∈[-94,+74]mm · 3 of 58 slices shown]
[im 1/58]
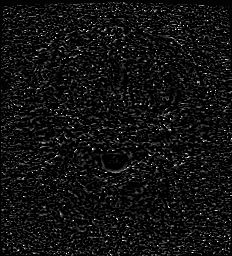
[im 29/58]
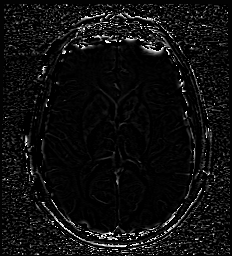
[im 58/58]
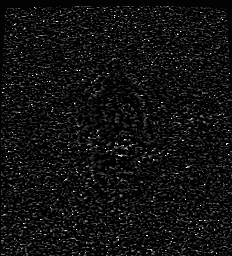

[Series 14: swi_images · axial · 3.0mm · 0.90mm/px · z∈[-94,+79]mm · 4 of 60 slices shown]
[im 1/60]
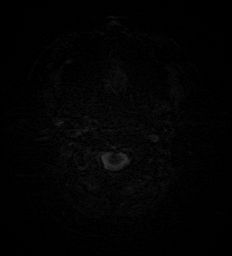
[im 20/60]
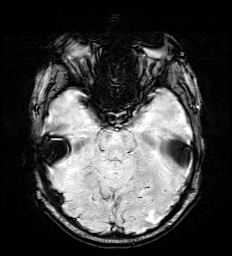
[im 40/60]
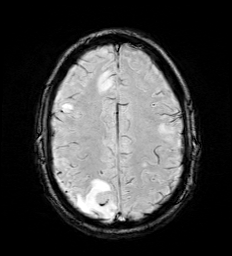
[im 60/60]
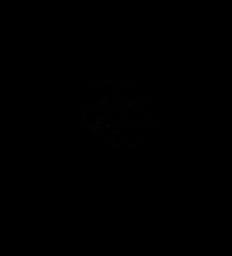

[Series 16: T1 · axial · 1.0mm · 0.98mm/px · z∈[-95,+76]mm · 10 of 176 slices shown (2 of 2)]
[im 1/176]
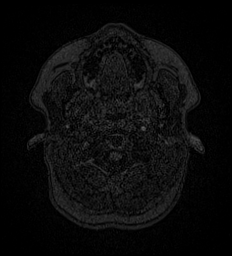
[im 20/176]
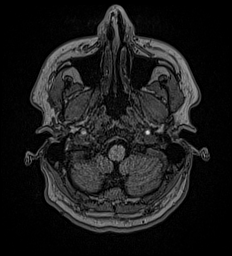
[im 39/176]
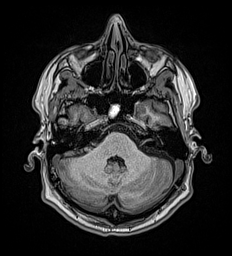
[im 59/176]
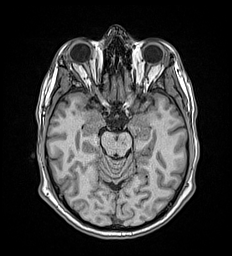
[im 78/176]
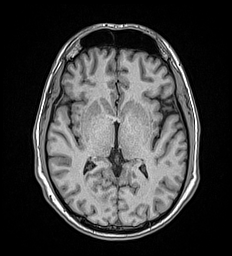
[im 98/176]
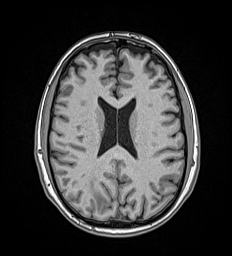
[im 117/176]
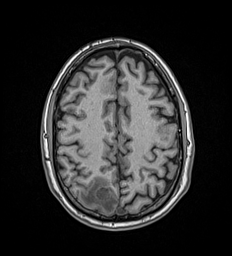
[im 137/176]
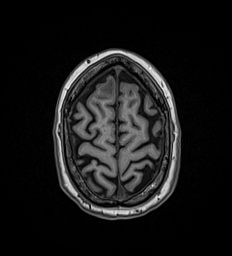
[im 156/176]
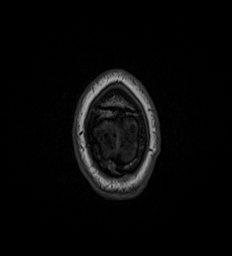
[im 176/176]
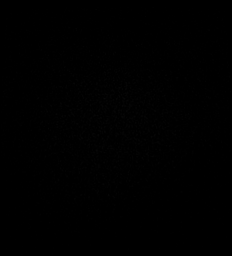

[Series 17: T2 · coronal · 5.0mm · 0.45mm/px · 2 of 34 slices shown]
[im 1/34]
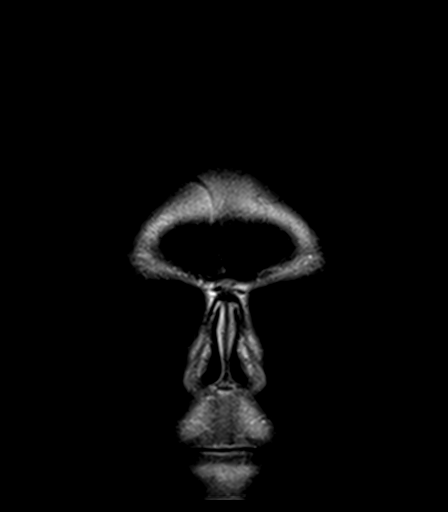
[im 34/34]
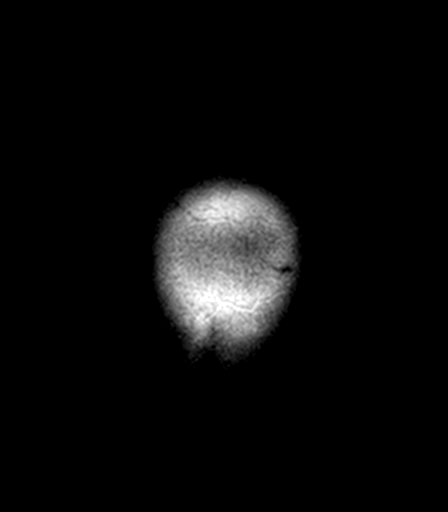

[Series 18: T1 post-contrast · axial · 1.0mm · 0.98mm/px · z∈[-95,+76]mm · 10 of 176 slices shown (1 of 3)]
[im 1/176]
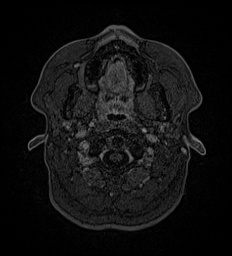
[im 20/176]
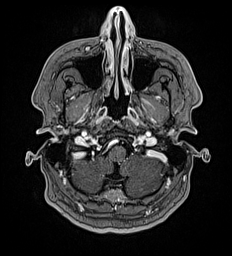
[im 39/176]
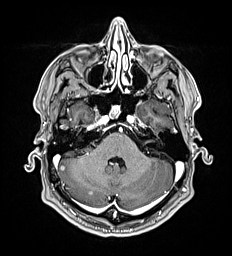
[im 59/176]
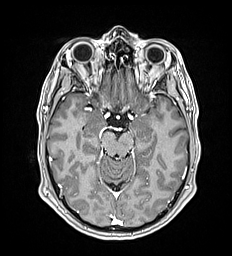
[im 78/176]
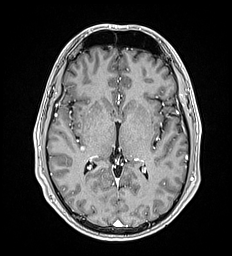
[im 98/176]
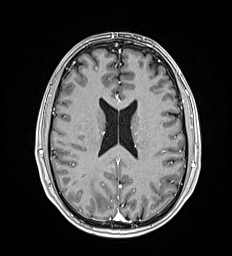
[im 117/176]
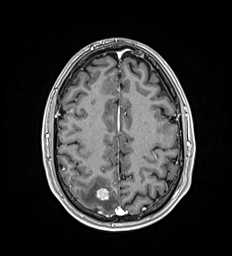
[im 137/176]
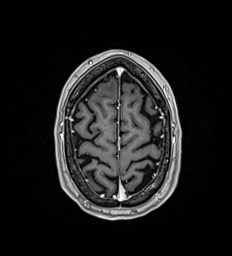
[im 156/176]
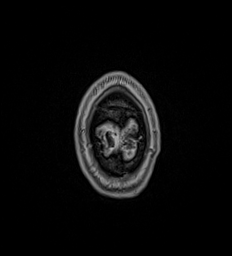
[im 176/176]
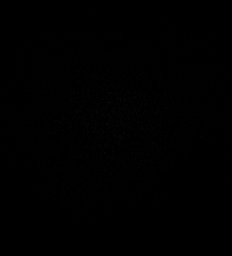

[Series 19: T1 post-contrast · coronal · 5.0mm · 0.57mm/px · 2 of 34 slices shown (2 of 3)]
[im 1/34]
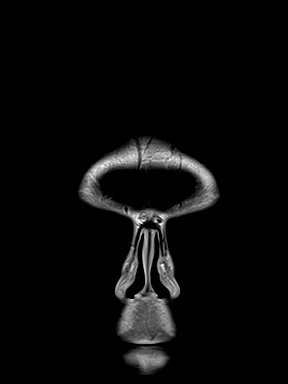
[im 34/34]
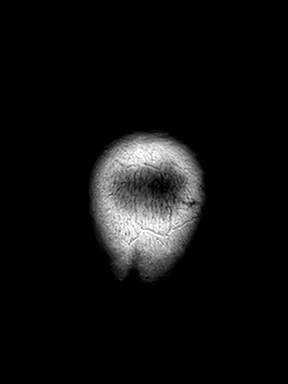

[Series 20: T1 post-contrast · sagittal · 5.0mm · 0.62mm/px · 1 of 25 slices shown (3 of 3)]
[im 1/25]
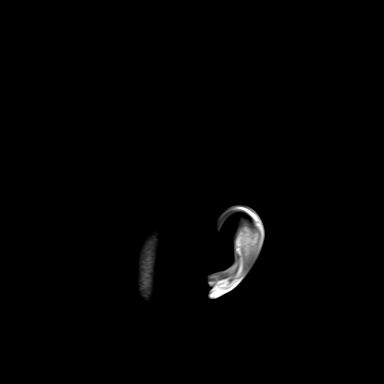

[48 of 48 positions shown; findings below may reference images not displayed]

FINDINGS: Brain: There are numerous contrast-enhancing lesions throughout the
brain. The largest lesions are in the right frontal and parietal
lobes. The largest right parietal lesion measures 11 mm with
moderate surrounding edema. The largest right frontal lesion
measures 10 mm with mild surrounding edema. There are greater than
20 lesions in total. No midline shift or other mass effect. No acute
infarct. No acute hemorrhage. Ventricular sizes are normal.

Vascular: Normal flow voids.

Skull and upper cervical spine: Normal marrow signal.

Sinuses/Orbits: Negative.

Other: None.
IMPRESSION: Numerous scattered intraparenchymal metastases. The largest lesions
are in the right frontal and parietal lobes with moderate
surrounding edema. No midline shift or other mass effect.

## 2021-03-28 IMAGING — DX DG CHEST 1V PORT
1 series · 1 of 1 positions shown · non-contrast
Comparison: [DATE] PET-CT

CLINICAL DATA: Pleural effusion AIZIREK/AIZIREK

EXAM:
PORTABLE CHEST - 1 VIEW

[chest ap]
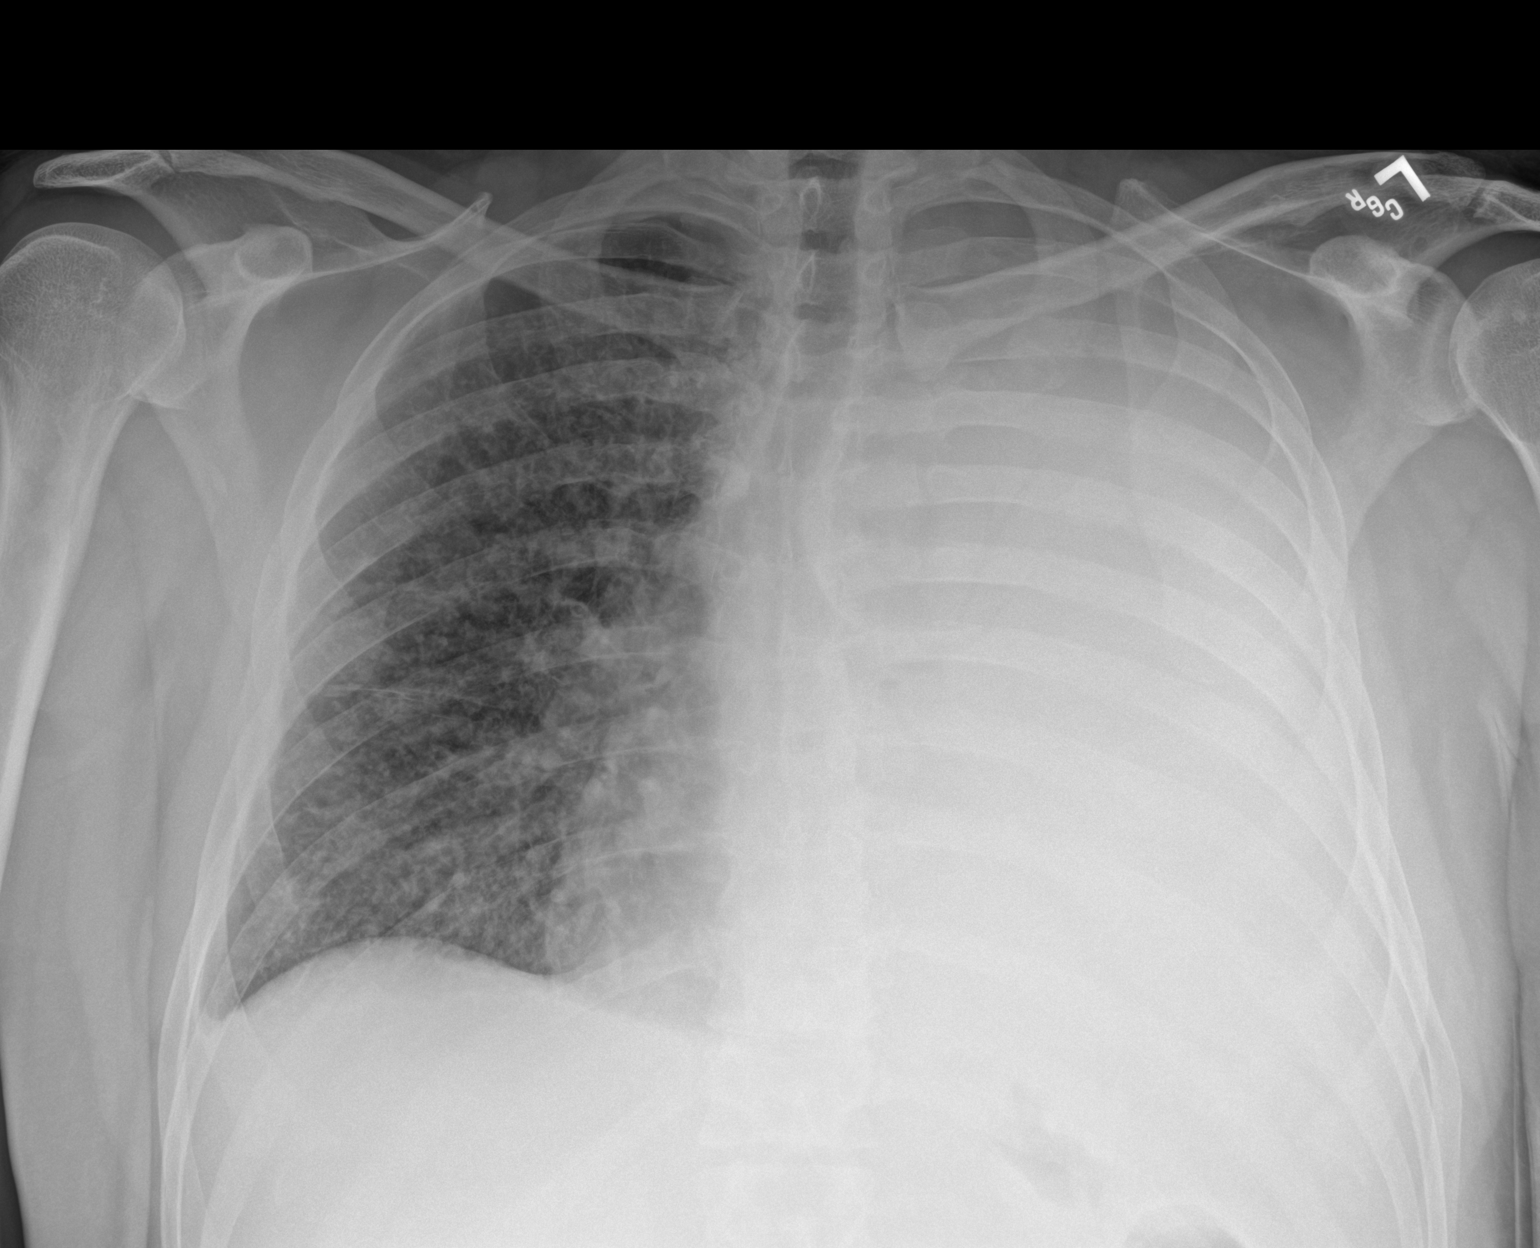

[1 of 1 positions shown; findings below may reference images not displayed]

FINDINGS: No pneumothorax. Near complete opacification of left hemithorax with
central air bronchograms. Heart size difficult to assess due to
adjacent opacities. On the right, diffuse pulmonary vascular
congestion.

Lytic lesions in bilateral ribs are identified, with probable
pathologic fracture involving the right sixth rib.
IMPRESSION: 1. No pneumothorax post left thoracentesis, with persistent
opacification of the left hemithorax.

## 2021-03-28 IMAGING — US US THORACENTESIS ASP PLEURAL SPACE W/IMG GUIDE
1 series · 5 of 5 positions shown · non-contrast
Comparison: none

INDICATION: Non-small cell lung carcinoma, metastatic.  Left pleural effusion.

[Series 1: us thoracentesis asp pleural s · 5 of 5 slices shown]
[im 1/5]
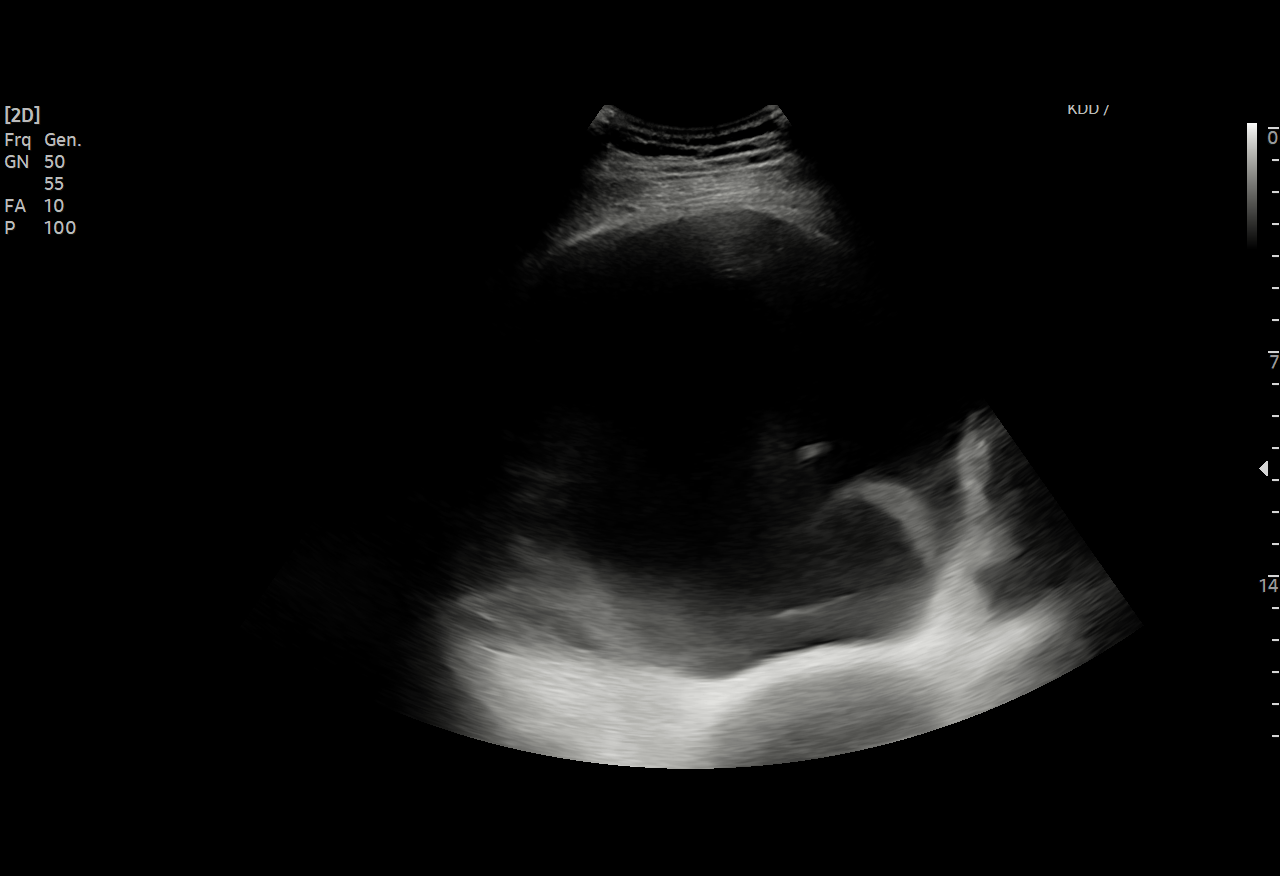
[im 2/5]
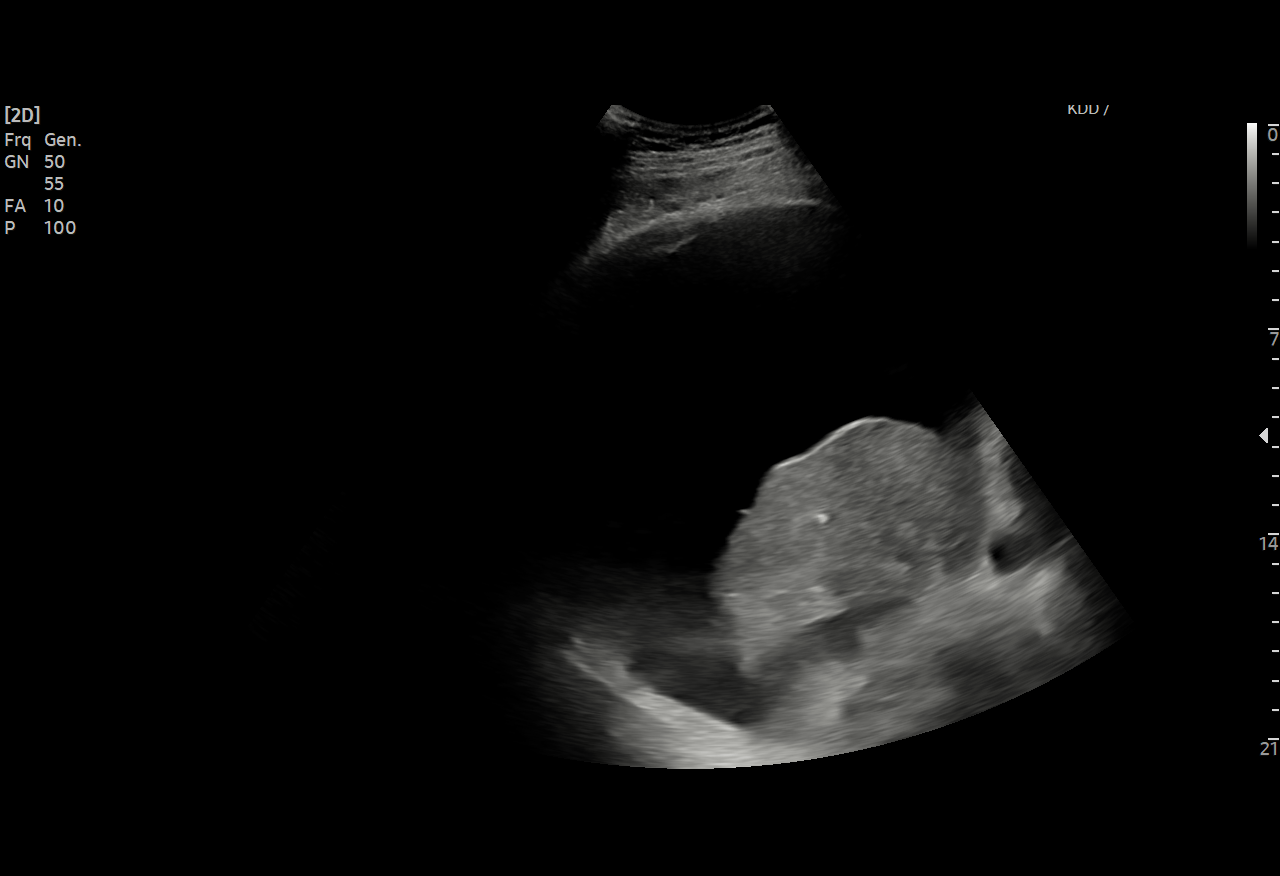
[im 3/5]
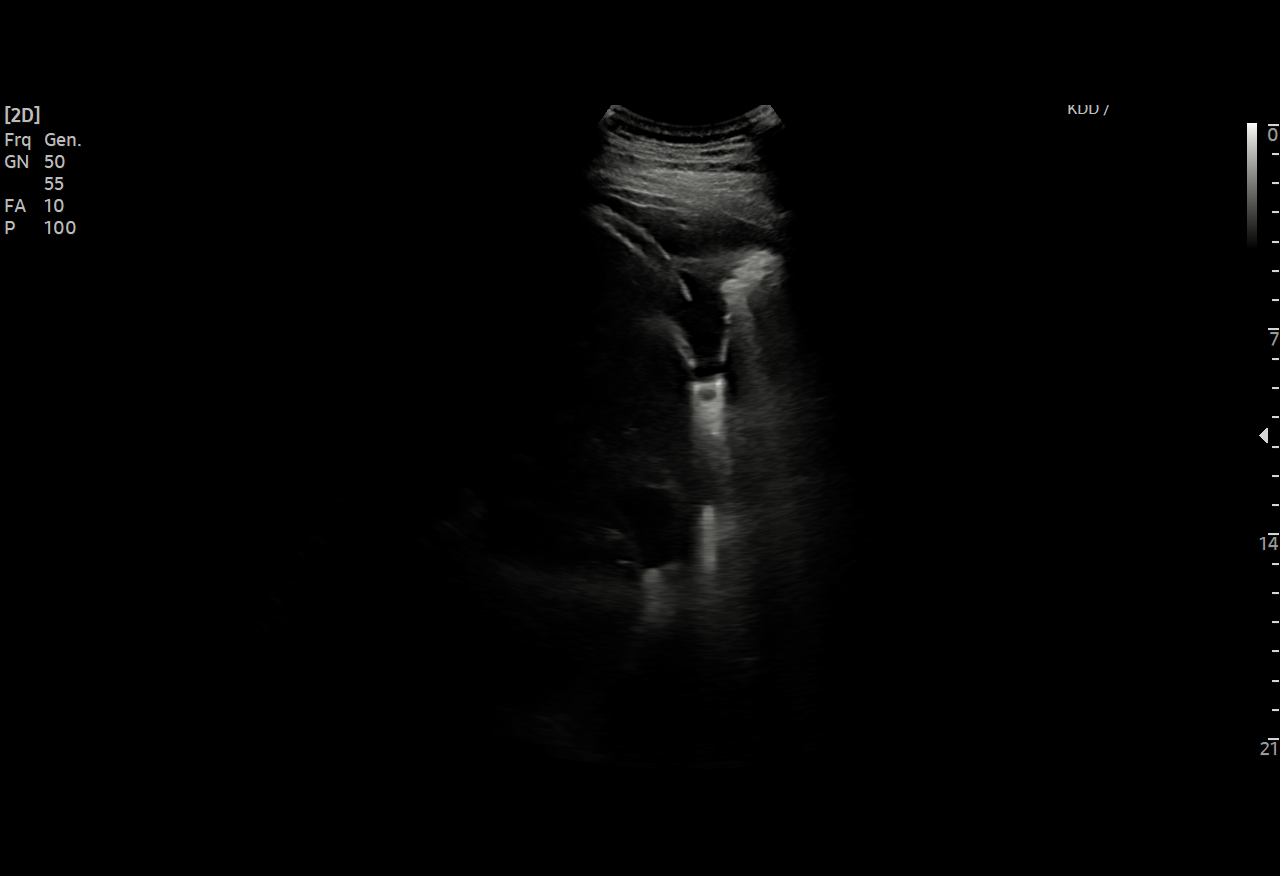
[im 4/5]
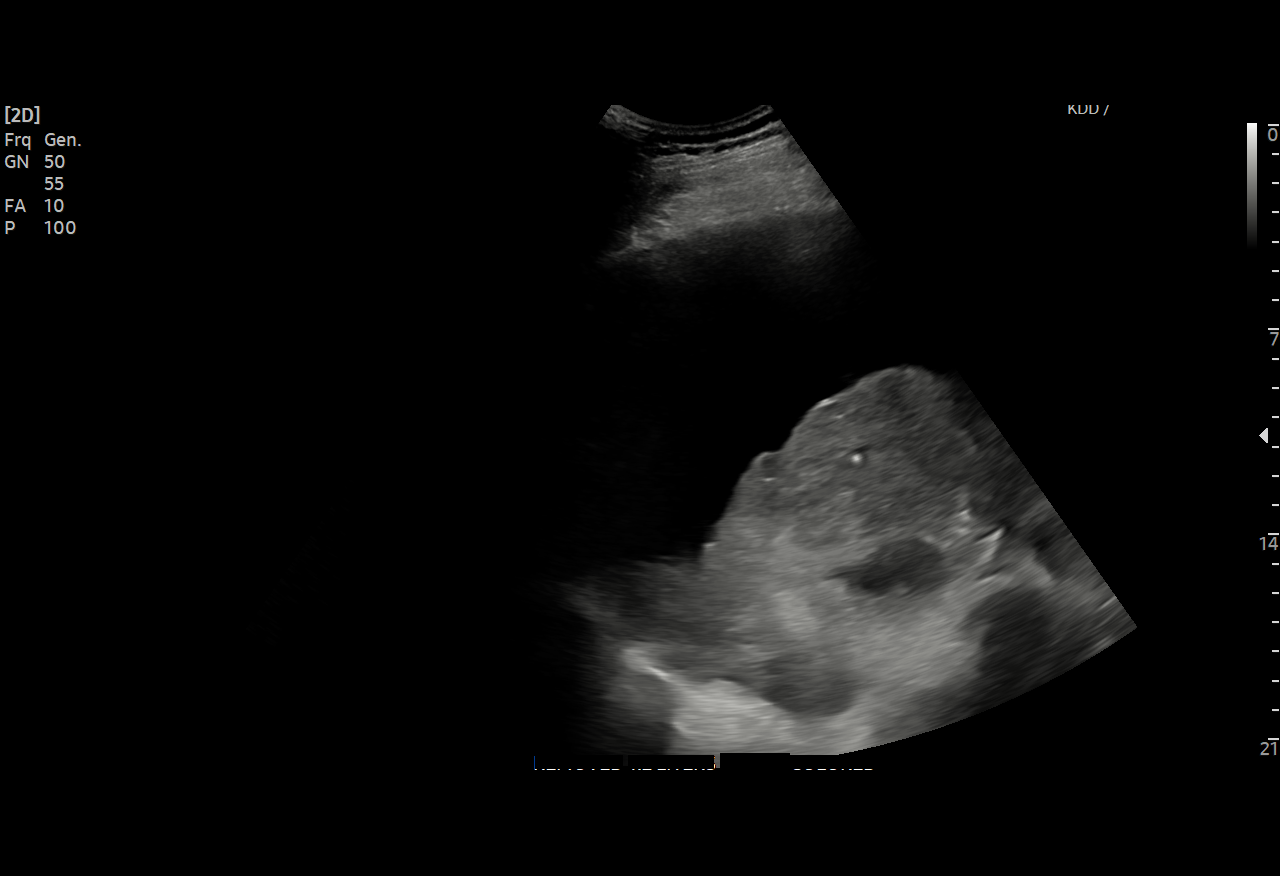
[im 5/5]
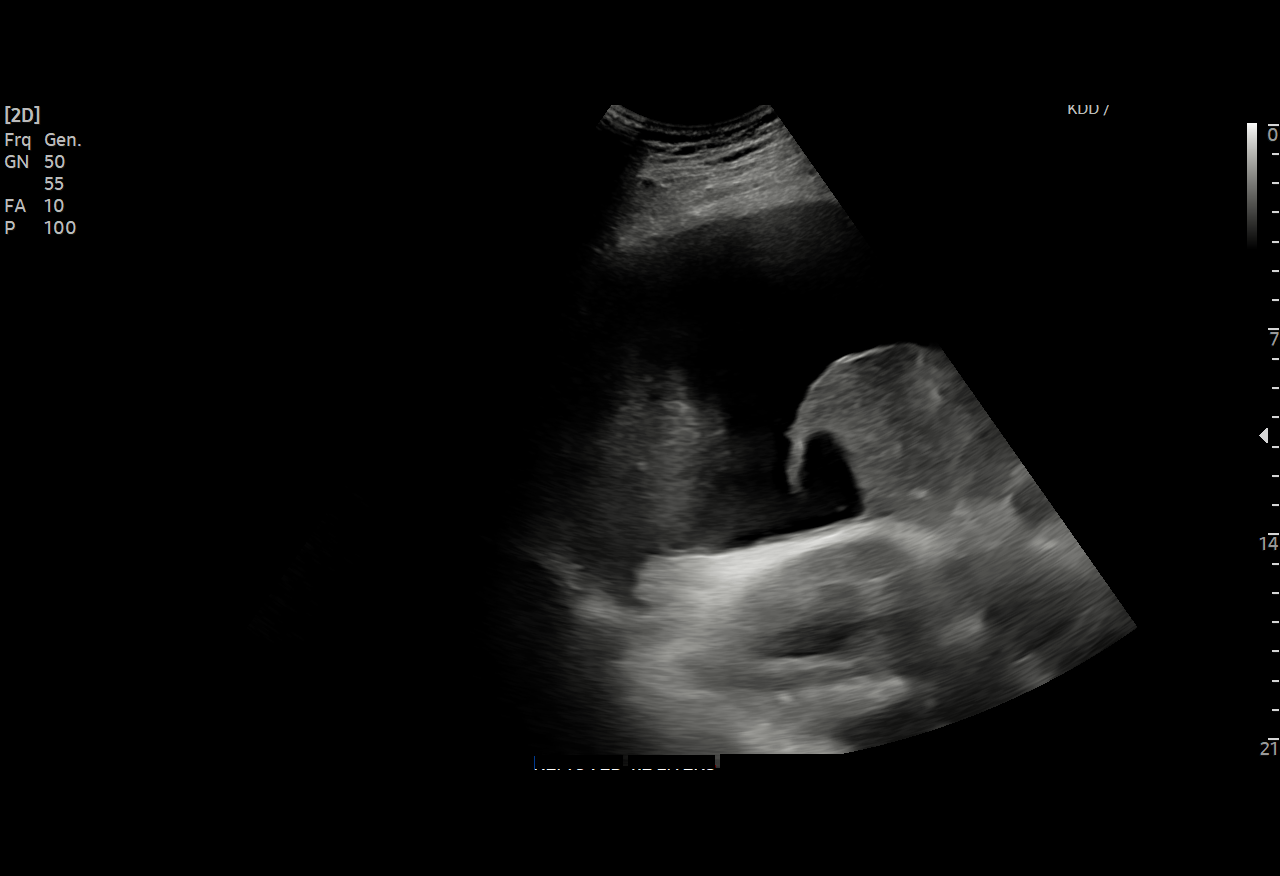

[5 of 5 positions shown; findings below may reference images not displayed]

EXAM:
ULTRASOUND GUIDED LEFT THORACENTESIS

MEDICATIONS:
Lidocaine 1% subcutaneous

COMPLICATIONS:
None immediate.  No pneumothorax on follow-up radiograph.

PROCEDURE:
An ultrasound guided thoracentesis was thoroughly discussed with the
patient and questions answered. The benefits, risks, alternatives
and complications were also discussed. The patient understands and
wishes to proceed with the procedure. Written consent was obtained.

Ultrasound was performed to localize and mark an adequate pocket of
fluid in the left chest. The area was then prepped and draped in the
normal sterile fashion. 1% Lidocaine was used for local anesthesia.
Under ultrasound guidance a 6 Fr Safe-T-Centesis catheter was
introduced. Thoracentesis was performed. The catheter was removed
and a dressing applied.
FINDINGS: A total of approximately 1.2 L of clear yellow fluid was removed.
IMPRESSION: Successful ultrasound guided left thoracentesis yielding 1.2 L of
pleural fluid.

## 2021-03-28 SURGERY — PORTA CATH INSERTION
Anesthesia: Moderate Sedation

## 2021-03-28 MED ORDER — HYDROCOD POLST-CPM POLST ER 10-8 MG/5ML PO SUER
ORAL | Status: AC
  Filled 2021-03-28: qty 5

## 2021-03-28 MED ORDER — METHYLPREDNISOLONE SODIUM SUCC 125 MG IJ SOLR: 125.0000 mg | Freq: Once | INTRAMUSCULAR | Status: DC | PRN

## 2021-03-28 MED ORDER — FENTANYL CITRATE (PF) 100 MCG/2ML IJ SOLN
INTRAMUSCULAR | Status: DC | PRN
  Administered 2021-03-28: 25 ug via INTRAVENOUS
  Administered 2021-03-28: 50 ug via INTRAVENOUS
  Administered 2021-03-28: 25 ug via INTRAVENOUS

## 2021-03-28 MED ORDER — ADENOSINE 6 MG/2ML IV SOLN
INTRAVENOUS | Status: AC
  Administered 2021-03-28: 6 mg via INTRAVENOUS
  Filled 2021-03-28: qty 2

## 2021-03-28 MED ORDER — CHLORHEXIDINE GLUCONATE CLOTH 2 % EX PADS
6.0000 | MEDICATED_PAD | Freq: Every day | CUTANEOUS | Status: DC
  Administered 2021-03-28: 2 via TOPICAL

## 2021-03-28 MED ORDER — DIPHENHYDRAMINE HCL 50 MG/ML IJ SOLN: 50.0000 mg | Freq: Once | INTRAMUSCULAR | Status: DC | PRN

## 2021-03-28 MED ORDER — FOLIC ACID 1 MG PO TABS
1.0000 mg | ORAL_TABLET | Freq: Every day | ORAL | Status: DC
  Administered 2021-03-28 – 2021-03-30 (×3): 1 mg via ORAL
  Filled 2021-03-28 (×3): qty 1

## 2021-03-28 MED ORDER — HYDROCOD POLST-CPM POLST ER 10-8 MG/5ML PO SUER
5.0000 mL | Freq: Once | ORAL | Status: AC
  Administered 2021-03-28: 5 mL via ORAL

## 2021-03-28 MED ORDER — SODIUM CHLORIDE 0.9 % IV SOLN: INTRAVENOUS | Status: DC

## 2021-03-28 MED ORDER — GADOBUTROL 1 MMOL/ML IV SOLN
9.0000 mL | Freq: Once | INTRAVENOUS | Status: AC | PRN
  Administered 2021-03-28: 9 mL via INTRAVENOUS

## 2021-03-28 MED ORDER — MIDAZOLAM HCL 2 MG/2ML IJ SOLN
INTRAMUSCULAR | Status: DC | PRN
  Administered 2021-03-28 (×3): 1 mg via INTRAVENOUS

## 2021-03-28 MED ORDER — MIDAZOLAM HCL 2 MG/2ML IJ SOLN
INTRAMUSCULAR | Status: AC
  Filled 2021-03-28: qty 2

## 2021-03-28 MED ORDER — ONDANSETRON HCL 4 MG/2ML IJ SOLN: 4.0000 mg | Freq: Four times a day (QID) | INTRAMUSCULAR | Status: DC | PRN

## 2021-03-28 MED ORDER — CEFAZOLIN SODIUM-DEXTROSE 2-4 GM/100ML-% IV SOLN: 2.0000 g | Freq: Once | INTRAVENOUS | Status: DC

## 2021-03-28 MED ORDER — FAMOTIDINE 20 MG PO TABS: 40.0000 mg | ORAL_TABLET | Freq: Once | ORAL | Status: DC | PRN

## 2021-03-28 MED ORDER — HYDROMORPHONE HCL 1 MG/ML IJ SOLN: 1.0000 mg | Freq: Once | INTRAMUSCULAR | Status: DC | PRN

## 2021-03-28 MED ORDER — HYDROCOD POLST-CPM POLST ER 10-8 MG/5ML PO SUER
5.0000 mL | Freq: Two times a day (BID) | ORAL | Status: DC | PRN
  Administered 2021-03-28 – 2021-03-29 (×2): 5 mL via ORAL
  Filled 2021-03-28 (×2): qty 5

## 2021-03-28 MED ORDER — AMOXICILLIN-POT CLAVULANATE 500-125 MG PO TABS
1.0000 | ORAL_TABLET | Freq: Two times a day (BID) | ORAL | Status: DC
  Administered 2021-03-28 – 2021-03-30 (×4): 500 mg via ORAL
  Filled 2021-03-28 (×6): qty 1

## 2021-03-28 MED ORDER — ONDANSETRON HCL 4 MG PO TABS: 4.0000 mg | ORAL_TABLET | Freq: Four times a day (QID) | ORAL | Status: DC | PRN

## 2021-03-28 MED ORDER — FENTANYL CITRATE (PF) 100 MCG/2ML IJ SOLN
INTRAMUSCULAR | Status: AC
  Filled 2021-03-28: qty 2

## 2021-03-28 MED ORDER — DILTIAZEM HCL 25 MG/5ML IV SOLN
INTRAVENOUS | Status: DC | PRN
  Administered 2021-03-28: 10 mg via INTRAVENOUS

## 2021-03-28 MED ORDER — ADENOSINE 6 MG/2ML IV SOLN: 6.0000 mg | Freq: Once | INTRAVENOUS | Status: AC

## 2021-03-28 MED ORDER — DILTIAZEM HCL 25 MG/5ML IV SOLN
INTRAVENOUS | Status: AC
  Filled 2021-03-28: qty 5

## 2021-03-28 MED ORDER — SODIUM CHLORIDE 0.9 % IV SOLN: 250.0000 mL | INTRAVENOUS | Status: DC | PRN

## 2021-03-28 MED ORDER — SODIUM CHLORIDE 0.9% FLUSH
3.0000 mL | Freq: Two times a day (BID) | INTRAVENOUS | Status: DC
  Administered 2021-03-28 – 2021-03-30 (×4): 3 mL via INTRAVENOUS

## 2021-03-28 MED ORDER — SODIUM CHLORIDE 0.9% FLUSH: 3.0000 mL | INTRAVENOUS | Status: DC | PRN

## 2021-03-28 MED ORDER — HYDROCODONE-ACETAMINOPHEN 5-325 MG PO TABS
1.0000 | ORAL_TABLET | Freq: Four times a day (QID) | ORAL | Status: DC | PRN
  Administered 2021-03-29: 1 via ORAL
  Filled 2021-03-28: qty 1

## 2021-03-28 MED ORDER — SODIUM CHLORIDE 0.9 % IV SOLN
Freq: Once | INTRAVENOUS | Status: DC
  Filled 2021-03-28 (×2): qty 2

## 2021-03-28 MED ORDER — MIDAZOLAM HCL 2 MG/ML PO SYRP: 8.0000 mg | ORAL_SOLUTION | Freq: Once | ORAL | Status: DC | PRN

## 2021-03-28 SURGICAL SUPPLY — 13 items
ADH SKN CLS APL DERMABOND .7 (GAUZE/BANDAGES/DRESSINGS) ×1
CANNULA 5F STIFF (CANNULA) ×3 IMPLANT
COVER PROBE U/S 5X48 (MISCELLANEOUS) ×3 IMPLANT
COVER SURGICAL LIGHT HANDLE (MISCELLANEOUS) ×3 IMPLANT
DERMABOND ADVANCED (GAUZE/BANDAGES/DRESSINGS) ×2
DERMABOND ADVANCED .7 DNX12 (GAUZE/BANDAGES/DRESSINGS) ×1 IMPLANT
DRAPE INCISE IOBAN 66X45 STRL (DRAPES) ×3 IMPLANT
KIT PORT POWER 8FR ISP CVUE (Port) ×3 IMPLANT
PACK ANGIOGRAPHY (CUSTOM PROCEDURE TRAY) ×3 IMPLANT
SPONGE XRAY 4X4 16PLY STRL (MISCELLANEOUS) ×3 IMPLANT
SUT MNCRL AB 4-0 PS2 18 (SUTURE) ×3 IMPLANT
SUT VIC AB 3-0 SH 27 (SUTURE) ×3
SUT VIC AB 3-0 SH 27X BRD (SUTURE) ×1 IMPLANT

## 2021-03-28 NOTE — OR Nursing (Signed)
During procedure, pt noted to be in SVT in rate 180's. MD made aware. Cardizem given per MD Schnier's orders with no change. PT stable but HR in 180's still. Procedure finished. PT brought out to recovery, hooked to 12 lead EKG and zoll pads. Dr. Ubaldo Glassing to bedside. 6mg  of adenosine given per orders. PT converted to sinus tach. New EKG given to Dr. Ubaldo Glassing.  PT made aware he will be admitted to the hospital.

## 2021-03-28 NOTE — Interval H&P Note (Signed)
History and Physical Interval Note:  03/28/2021 3:22 PM  [name]  has presented today for surgery, with the diagnosis of Porta Cath Placement  Lung Ca.  The various methods of treatment have been discussed with the patient and family. After consideration of risks, benefits and other options for treatment, the patient has consented to  Procedure(s): PORTA CATH INSERTION (N/A) as a surgical intervention.  The patient's history has been reviewed, patient examined, no change in status, stable for surgery.  I have reviewed the patient's chart and labs.  Questions were answered to the patient's satisfaction.     Hortencia Pilar

## 2021-03-28 NOTE — H&P (Signed)
History and Physical    [name] ZJQ:[id number] DOB: [birth year] DOA: 03/28/2021  PCP: Pcp, No   Patient coming from: Home  I have personally briefly reviewed patient's old medical records in [place]  Chief Complaint: Palpitation  HPI: [name] is a 40 y.o. 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 Mediport infection patient was noted to be tachycardic with heart rate in the 180s and on the monitor was noted to have supraventricular tachycardia.  Patient received 10 mg of IV Cardizem without any improvement and then received 6 mg of IV adenosine and converted to sinus tachycardia. Patient states that this did not feel like his previous episodes of tachycardia with his known WPW syndrome. He was diaphoretic and states that he has had episodes of diaphoresis over the last couple of days and has a cough with hemoptysis. He feels well and denies having any chest pain, no shortness of breath, no nausea, no vomiting, no fever, no chills, no dizziness, no lightheadedness, no urinary symptoms, no changes in his bowel habits. Chest x-ray reviewed by me shows no pneumothorax status post left thoracentesis with persistent opacification of the left hemothorax. PET scan showed left lung collapse with 7 cm hypermetabolic masslike opacity centered in the left lower lobe and involving the left hilum, consistent with primary bronchogenic carcinoma. Large malignant left pleural effusion. Hypermetabolic lymphadenopathy in mediastinum and left supraclavicular region.Peritoneal/omental carcinomatosis. Diffuse bone metastases. Twelve-lead EKG shows sinus tachycardia with low voltage QRS.     ED Course:  N/A EKG as reviewed by me :  Imaging:    Review of Systems: As per HPI otherwise all other systems reviewed and negative.    Past Medical History:  Diagnosis Date   Cancer ([place])    Dyspnea    Heartburn    Pneumonia    Wolff-Parkinson-White (WPW) 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    Past Surgical History:  Procedure Laterality Date   FRACTURE SURGERY     broke femur when he was 6 years   VIDEO BRONCHOSCOPY WITH ENDOBRONCHIAL NAVIGATION N/A 03/15/2021   Procedure: VIDEO BRONCHOSCOPY WITH ENDOBRONCHIAL NAVIGATION;  Surgeon: Ottie Glazier, MD;  Location: [facility] ORS;  Service: Thoracic;  Laterality: N/A;   VIDEO BRONCHOSCOPY WITH ENDOBRONCHIAL ULTRASOUND N/A 03/15/2021   Procedure: VIDEO BRONCHOSCOPY WITH ENDOBRONCHIAL ULTRASOUND;  Surgeon: Ottie Glazier, MD;  Location: [facility] ORS;  Service: Thoracic;  Laterality: N/A;     reports that he has never smoked. His smokeless tobacco use includes snuff and chew. He reports that he does not drink alcohol and does not use drugs.  Allergies  Allergen Reactions   Codeine     Makes pt hyper    Family History  Problem Relation Age of Onset   High Cholesterol Mother    Hypertension Father    Cancer Father    Lung cancer Father    Skin cancer Father       Prior to Admission medications   Medication Sig Start Date End Date Taking? Authorizing Provider  amoxicillin-clavulanate (AUGMENTIN) 500-125 MG tablet Take 1 tablet by mouth in the morning and at bedtime. 03/20/21 03/30/21 Yes [provider]  chlorpheniramine-HYDROcodone (TUSSIONEX) 10-8 MG/5ML SUER Take 5 mLs by mouth every 12 (twelve) hours as needed for cough. 03/23/21  Yes  [name], MD  HYDROcodone-acetaminophen (NORCO/VICODIN) 5-325 MG tablet Take 1 tablet by mouth every 6 (six) hours as needed for moderate pain. 03/23/21  Yes [name], MD  dexamethasone (DECADRON) 4 MG tablet Take one pill AM & PM for 2 days. TAKE day prior & day AFTER chemo. Do NOT take on day of chemo. 03/23/21   [name], MD  folic acid (FOLVITE) 1 MG tablet Take 1 tablet (1 mg total) by mouth daily. 03/23/21   [name], MD  lidocaine-prilocaine (EMLA) cream Apply 1 application topically as needed. 03/23/21   [name], MD  ondansetron (ZOFRAN) 8 MG tablet One pill every 8 hours as needed for nausea/vomitting. 03/23/21   [name], MD  prochlorperazine (COMPAZINE) 10 MG tablet Take 1 tablet (10 mg total) by mouth every 6 (six) hours as needed for nausea or vomiting. 03/23/21   [name], MD    Physical Exam: Vitals:   03/28/21 1616 03/28/21 1630 03/28/21 1633 03/28/21 1645  BP: 135/83 (!) 143/104 116/84 (!) 131/97  Pulse: (!) 105 (!) 101    Resp: (!) 27 (!) 21 (!) 22 18  SpO2: 96% 95%  96%  Weight:      Height:         Vitals:   03/28/21 1616 03/28/21 1630 03/28/21 1633 03/28/21 1645  BP: 135/83 (!) 143/104 116/84 (!) 131/97  Pulse: (!) 105 (!) 101    Resp: (!) 27 (!) 21 (!) 22 18  SpO2: 96% 95%  96%  Weight:      Height:          Constitutional: Alert and oriented x 3 . Not in any apparent distress HEENT:      Head: Normocephalic and atraumatic.         Eyes: PERLA, EOMI, Conjunctivae are normal. Sclera is non-icteric.       Mouth/Throat: Mucous membranes are moist.       Neck: Supple with no signs of meningismus. Cardiovascular: Tachycardia. No murmurs, gallops, or rubs. 2+ symmetrical distal pulses are present . No JVD. No LE edema.  Mediport over right anterior chest wall Respiratory: Respiratory effort normal .decreased breath sounds left lung base right lung clear to auscultation.   Gastrointestinal: Soft, non tender, and non distended with positive bowel sounds.  Genitourinary: No CVA tenderness. Musculoskeletal: Nontender with normal range of motion in all extremities. No cyanosis, or erythema of extremities. Neurologic:  Face is symmetric. Moving all extremities. No gross focal neurologic deficits . Skin: Skin is warm, dry.  No rash or  ulcers Psychiatric: Mood and affect are normal    Labs on Admission: I have personally reviewed following labs and imaging studies  CBC: No results for input(s): WBC, NEUTROABS, HGB, HCT, MCV, PLT in the last 168 hours. Basic Metabolic Panel: Recent Labs  Lab 03/23/21 1233  NA 133*  K 4.2  CL 95*  CO2 24  GLUCOSE 96  BUN 17  CREATININE 0.82  CALCIUM 8.7*   GFR: Estimated Creatinine Clearance: 136.7 mL/min (by C-G formula based on SCr of 0.82 mg/dL). Liver Function Tests: Recent Labs  Lab 03/23/21 1233  AST 50*  ALT 97*  ALKPHOS 399*  BILITOT 0.9  PROT 7.1  ALBUMIN 3.2*   No results for input(s): LIPASE, AMYLASE in the last 168 hours. No results for input(s): AMMONIA in the last 168 hours. Coagulation Profile: No results for input(s): INR, PROTIME in the last 168 hours. Cardiac Enzymes: No  results for input(s): CKTOTAL, CKMB, CKMBINDEX, TROPONINI in the last 168 hours. BNP (last 3 results) No results for input(s): PROBNP in the last 8760 hours. HbA1C: No results for input(s): HGBA1C in the last 72 hours. CBG: Recent Labs  Lab 03/27/21 1313  GLUCAP 90   Lipid Profile: No results for input(s): CHOL, HDL, LDLCALC, TRIG, CHOLHDL, LDLDIRECT in the last 72 hours. Thyroid Function Tests: No results for input(s): TSH, T4TOTAL, FREET4, T3FREE, THYROIDAB in the last 72 hours. Anemia Panel: No results for input(s): VITAMINB12, FOLATE, FERRITIN, TIBC, IRON, RETICCTPCT in the last 72 hours. Urine analysis: No results found for: COLORURINE, APPEARANCEUR, Cataract, [place], GLUCOSEU, HGBUR, BILIRUBINUR, KETONESUR, PROTEINUR, UROBILINOGEN, NITRITE, LEUKOCYTESUR  Radiological Exams on Admission: NM PET Image Initial (PI) Skull Base To Thigh  Result Date: 03/28/2021 CLINICAL DATA:  Initial treatment strategy for non-small cell lung carcinoma. EXAM: NUCLEAR MEDICINE PET SKULL BASE TO THIGH TECHNIQUE: 10.7 mCi F-18 FDG was injected intravenously. Full-ring PET imaging was  performed from the skull base to thigh after the radiotracer. CT data was obtained and used for attenuation correction and anatomic localization. Fasting blood glucose: 90 mg/dl COMPARISON:  03/15/2021 FINDINGS: Mediastinal blood-pool activity (background): SUV max = 2.6 Liver activity (reference): SUV max = N/A NECK: 6 mm left supraclavicular lymph node on image 96/2 is hypermetabolic, with SUV max of 3.6. Incidental CT findings:  None. CHEST: Left lung collapse is seen with a large hypermetabolic masslike opacity in the perihilar region and left lower lobe, which measures approximately 7.0 x 5.8 cm on image 101/3 and has SUV max of 17.5. Hypermetabolic activity in the collapsed left upper lobe is likely secondary to atelectasis. Large left pleural effusion causes mediastinal shift to the right and shows pleural base areas mild FDG uptake, highly suspicious for malignant pleural effusion. A 10 mm subcarinal lymph node shows FDG uptake with SUV max of 3.9. A 10 mm right paratracheal lymph node shows FDG uptake with SUV max of 3.9. Incidental CT findings:  None. ABDOMEN/PELVIS: No abnormal hypermetabolic activity within the liver, pancreas, adrenal glands, or spleen. 1.7 cm hypermetabolic soft tissue density in the splenic hilum has SUV max of 6.2, and may represent a hypermetabolic lymph node or peritoneal implant. Ill-defined soft tissue density in the left upper quadrant omental fat is hypermetabolic with SUV max of 4.5, consistent with omental carcinomatosis. Hypermetabolic activity is seen involving the peritoneum in dependent pelvis, also suspicious for peritoneal carcinomatosis. No other hypermetabolic soft tissue masses or lymphadenopathy identified. Incidental CT findings:  None. SKELETON: Numerous hypermetabolic bone metastases are seen in the bilateral scapulae, bilateral ribs, thoracic and lumbosacral spine, and pelvis, most of which are lytic in appearance. Incidental CT findings:  None. IMPRESSION: Left  lung collapse with 7 cm hypermetabolic masslike opacity centered in the left lower lobe and involving the left hilum, consistent with primary bronchogenic carcinoma. Large malignant left pleural effusion. Hypermetabolic lymphadenopathy in mediastinum and left supraclavicular region. Peritoneal/omental carcinomatosis. Diffuse bone metastases. Electronically Signed   By: Marlaine Hind M.D.   On: 03/28/2021 09:59   DG Chest Port 1 View  Result Date: 03/28/2021 CLINICAL DATA:  Pleural effusion left, s/p thora EXAM: PORTABLE CHEST - 1 VIEW COMPARISON:  03/27/2021 PET-CT FINDINGS: No pneumothorax. Near complete opacification of left hemithorax with central air bronchograms. Heart size difficult to assess due to adjacent opacities. On the right, diffuse pulmonary vascular congestion. Lytic lesions in bilateral ribs are identified, with probable pathologic fracture involving the right sixth rib. IMPRESSION: 1. No pneumothorax  post left thoracentesis, with persistent opacification of the left hemithorax. Electronically Signed   By: Lucrezia Europe M.D.   On: 03/28/2021 14:11   US THORACENTESIS ASP PLEURAL SPACE W/IMG GUIDE  Result Date: 03/28/2021 INDICATION: Non-small cell lung carcinoma, metastatic.  Left pleural effusion. EXAM: ULTRASOUND GUIDED LEFT THORACENTESIS MEDICATIONS: Lidocaine 1% subcutaneous COMPLICATIONS: None immediate.  No pneumothorax on follow-up radiograph. PROCEDURE: An ultrasound guided thoracentesis was thoroughly discussed with the patient and questions answered. The benefits, risks, alternatives and complications were also discussed. The patient understands and wishes to proceed with the procedure. Written consent was obtained. Ultrasound was performed to localize and mark an adequate pocket of fluid in the left chest. The area was then prepped and draped in the normal sterile fashion. 1% Lidocaine was used for local anesthesia. Under ultrasound guidance a 6 Fr Safe-T-Centesis catheter was  introduced. Thoracentesis was performed. The catheter was removed and a dressing applied. FINDINGS: A total of approximately 1.2 L of clear yellow fluid was removed. IMPRESSION: Successful ultrasound guided left thoracentesis yielding 1.2 L of pleural fluid. Electronically Signed   By: Lucrezia Europe M.D.   On: 03/28/2021 14:38     Assessment/Plan Principal Problem:   SVT (supraventricular tachycardia) (HCC) Active Problems:   Non-small cell cancer of left lung (HCC)   WPW (Wolff-Parkinson-White syndrome)   Hyponatremia   Transaminitis    Supraventricular tachycardia Patient had an episode of SVT while getting a Mediport inserted for his chemotherapy.. He has a known history of WPW syndrome Patient received IV Cardizem and 6 mg of adenosine and converted to sinus tachycardia He is stable and has been seen by cardiology Patient will be observed overnight    Non-small cell cancer of left lung Stage IV disease. Status post thoracentesis with drainage of 1.2 L of pleural fluid most likely malignant pleural effusion Status post Mediport insertion for chemotherapy Follow-up with oncology as an outpatient   Hyponatremia Most likely related to patient's known non-small cell lung cancer  DVT prophylaxis: Lovenox  Code Status: full code  Family Communication: Greater than 50% of time was spent discussing plan of care with patient and his wife at the bedside.  All questions and concerns have been addressed.  He verbalizes understanding and agrees with the plan. Disposition Plan: Back to previous home environment Consults called: Cardiology/oncology Status: Observation    Renell Coaxum MD [facility]     03/28/2021, 5:01 PM

## 2021-03-28 NOTE — H&P (Signed)
[facility] Admission History & Physical  MRN : [id number]  [name] is a 40 y.o. (11/27/1980) male who presents with chief complaint of No chief complaint on file. Marland Kitchen  History of Present Illness:  I am asked to see the patient by Dr. Rogue Bussing.  Patient is a 40 year old gentleman with non-small cell lung carcinoma who is currently undergoing chemotherapy with pemetrexed and carboplatin.  He has stage IV metastatic disease.  Given these factors appropriate parenteral access is required.   Current Facility-Administered Medications  Medication Dose Route Frequency Provider Last Rate Last Admin   0.9 %  sodium chloride infusion   Intravenous Continuous Kris Hartmann, NP 75 mL/hr at 03/28/21 1438 New Bag at 03/28/21 1438   ceFAZolin (ANCEF) IVPB 2g/100 mL premix  2 g Intravenous Once Kris Hartmann, NP       Chlorhexidine Gluconate Cloth 2 % PADS 6 each  6 each Topical Q0600 Kris Hartmann, NP   2 each at 03/28/21 1440   chlorpheniramine-HYDROcodone (TUSSIONEX) 10-8 MG/5ML suspension            diphenhydrAMINE (BENADRYL) injection 50 mg  50 mg Intravenous Once PRN Kris Hartmann, NP       famotidine (PEPCID) tablet 40 mg  40 mg Oral Once PRN Kris Hartmann, NP       fentaNYL (SUBLIMAZE) 100 MCG/2ML injection            HYDROmorphone (DILAUDID) injection 1 mg  1 mg Intravenous Once PRN Kris Hartmann, NP       methylPREDNISolone sodium succinate (SOLU-MEDROL) 125 mg/2 mL injection 125 mg  125 mg Intravenous Once PRN Kris Hartmann, NP       midazolam (VERSED) 2 MG/2ML injection            midazolam (VERSED) 2 MG/ML syrup 8 mg  8 mg Oral Once PRN Kris Hartmann, NP       ondansetron (ZOFRAN) injection 4 mg  4 mg Intravenous Q6H PRN Kris Hartmann, NP        Past Medical History:  Diagnosis Date   Cancer ([place])    Dyspnea    Heartburn    Pneumonia    Wolff-Parkinson-White (WPW) 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    Past Surgical History:  Procedure  Laterality Date   FRACTURE SURGERY     broke femur when he was 6 years   VIDEO BRONCHOSCOPY WITH ENDOBRONCHIAL NAVIGATION N/A 03/15/2021   Procedure: VIDEO BRONCHOSCOPY WITH ENDOBRONCHIAL NAVIGATION;  Surgeon: Ottie Glazier, MD;  Location: [facility];  Service: Thoracic;  Laterality: N/A;   VIDEO BRONCHOSCOPY WITH ENDOBRONCHIAL ULTRASOUND N/A 03/15/2021   Procedure: VIDEO BRONCHOSCOPY WITH ENDOBRONCHIAL ULTRASOUND;  Surgeon: Ottie Glazier, MD;  Location: [facility];  Service: Thoracic;  Laterality: N/A;     Social History   Tobacco Use   Smoking status: Never   Smokeless tobacco: Current    Types: Snuff, Chew  Vaping Use   Vaping Use: Never used  Substance Use Topics   Alcohol use: Never   Drug use: Never     Family History  Problem Relation Age of Onset   High Cholesterol Mother    Hypertension Father    Cancer Father    Lung cancer Father    Skin cancer Father   No family history of bleeding/clotting disorders, porphyria or autoimmune disease   Allergies  Allergen Reactions   Codeine     Makes pt hyper  REVIEW OF SYSTEMS (Negative unless checked)  Constitutional: [] Weight loss  [] Fever  [] Chills Cardiac: [] Chest pain   [] Chest pressure   [] Palpitations   [] Shortness of breath when laying flat   [x] Shortness of breath at rest   [x] Shortness of breath with exertion. Vascular:  [] Pain in legs with walking   [] Pain in legs at rest   [] Pain in legs when laying flat   [] Claudication   [] Pain in feet when walking  [] Pain in feet at rest  [] Pain in feet when laying flat   [] History of DVT   [] Phlebitis   [] Swelling in legs   [] Varicose veins   [] Non-healing ulcers Pulmonary:   [] Uses home oxygen   [] Productive cough   [] Hemoptysis   [] Wheeze  [] COPD   [] Asthma Neurologic:  [] Dizziness  [] Blackouts   [] Seizures   [] History of stroke   [] History of TIA  [] Aphasia   [] Temporary blindness   [] Dysphagia   [] Weakness or numbness in arms   [] Weakness or numbness in  legs Musculoskeletal:  [] Arthritis   [] Joint swelling   [] Joint pain   [] Low back pain Hematologic:  [] Easy bruising  [] Easy bleeding   [] Hypercoagulable state   [] Anemic  [] Hepatitis Gastrointestinal:  [] Blood in stool   [] Vomiting blood  [] Gastroesophageal reflux/heartburn   [] Difficulty swallowing. Genitourinary:  [] Chronic kidney disease   [] Difficult urination  [] Frequent urination  [] Burning with urination   [] Blood in urine Skin:  [] Rashes   [] Ulcers   [] Wounds Psychological:  [] History of anxiety   []  History of major depression.  Physical Examination  Vitals:   03/28/21 1422  BP: (!) 147/93  Pulse: 100  Resp: (!) 24  SpO2: 97%  Weight: 94.3 kg  Height: 5\' 10"  (1.778 m)   Body mass index is 29.84 kg/m. Gen: WD/WN, NAD Head: [place]/AT, No temporalis wasting. Prominent temp pulse not noted. Ear/Nose/Throat: Hearing grossly intact, nares w/o erythema or drainage, oropharynx w/o Erythema/Exudate,  Eyes: Conjunctiva clear, sclera non-icteric Neck: Trachea midline.  No JVD.  Pulmonary:  Good air movement, respirations not labored, no use of accessory muscles.  Cardiac: RRR, normal S1, S2. Vascular:  Vessel Right Left  Radial Palpable Palpable  Gastrointestinal: soft, non-tender/non-distended. No guarding/reflex.  Musculoskeletal: M/S 5/5 throughout.  Extremities without ischemic changes.  No deformity or atrophy.  Neurologic: Sensation grossly intact in extremities.  Symmetrical.  Speech is fluent. Motor exam as listed above. Psychiatric: Judgment intact, Mood & affect appropriate for pt's clinical situation. Dermatologic: No rashes or ulcers noted.  No cellulitis or open wounds.     CBC Lab Results  Component Value Date   WBC 10.8 (H) 03/10/2021   HGB 15.1 03/10/2021   HCT 44.1 03/10/2021   MCV 92.3 03/10/2021   PLT 342 03/10/2021    BMET    Component Value Date/Time   NA 133 (L) 03/23/2021 1233   K 4.2 03/23/2021 1233   CL 95 (L) 03/23/2021 1233   CO2 24  03/23/2021 1233   GLUCOSE 96 03/23/2021 1233   BUN 17 03/23/2021 1233   CREATININE 0.82 03/23/2021 1233   CALCIUM 8.7 (L) 03/23/2021 1233   GFRNONAA >60 03/23/2021 1233   Estimated Creatinine Clearance: 136.7 mL/min (by C-G formula based on SCr of 0.82 mg/dL).  COAG Lab Results  Component Value Date   INR 1.0 03/10/2021    Radiology NM PET Image Initial (PI) Skull Base To Thigh  Result Date: 03/28/2021 CLINICAL DATA:  Initial treatment strategy for non-small cell lung carcinoma. EXAM: NUCLEAR MEDICINE PET  SKULL BASE TO THIGH TECHNIQUE: 10.7 mCi F-18 FDG was injected intravenously. Full-ring PET imaging was performed from the skull base to thigh after the radiotracer. CT data was obtained and used for attenuation correction and anatomic localization. Fasting blood glucose: 90 mg/dl COMPARISON:  03/15/2021 FINDINGS: Mediastinal blood-pool activity (background): SUV max = 2.6 Liver activity (reference): SUV max = N/A NECK: 6 mm left supraclavicular lymph node on image 02/5 is hypermetabolic, with SUV max of 3.6. Incidental CT findings:  None. CHEST: Left lung collapse is seen with a large hypermetabolic masslike opacity in the perihilar region and left lower lobe, which measures approximately 7.0 x 5.8 cm on image 101/3 and has SUV max of 17.5. Hypermetabolic activity in the collapsed left upper lobe is likely secondary to atelectasis. Large left pleural effusion causes mediastinal shift to the right and shows pleural base areas mild FDG uptake, highly suspicious for malignant pleural effusion. A 10 mm subcarinal lymph node shows FDG uptake with SUV max of 3.9. A 10 mm right paratracheal lymph node shows FDG uptake with SUV max of 3.9. Incidental CT findings:  None. ABDOMEN/PELVIS: No abnormal hypermetabolic activity within the liver, pancreas, adrenal glands, or spleen. 1.7 cm hypermetabolic soft tissue density in the splenic hilum has SUV max of 6.2, and may represent a hypermetabolic lymph node or  peritoneal implant. Ill-defined soft tissue density in the left upper quadrant omental fat is hypermetabolic with SUV max of 4.5, consistent with omental carcinomatosis. Hypermetabolic activity is seen involving the peritoneum in dependent pelvis, also suspicious for peritoneal carcinomatosis. No other hypermetabolic soft tissue masses or lymphadenopathy identified. Incidental CT findings:  None. SKELETON: Numerous hypermetabolic bone metastases are seen in the bilateral scapulae, bilateral ribs, thoracic and lumbosacral spine, and pelvis, most of which are lytic in appearance. Incidental CT findings:  None. IMPRESSION: Left lung collapse with 7 cm hypermetabolic masslike opacity centered in the left lower lobe and involving the left hilum, consistent with primary bronchogenic carcinoma. Large malignant left pleural effusion. Hypermetabolic lymphadenopathy in mediastinum and left supraclavicular region. Peritoneal/omental carcinomatosis. Diffuse bone metastases. Electronically Signed   By: Marlaine Hind M.D.   On: 03/28/2021 09:59   DG Chest Port 1 View  Result Date: 03/28/2021 CLINICAL DATA:  Pleural effusion left, s/p thora EXAM: PORTABLE CHEST - 1 VIEW COMPARISON:  03/27/2021 PET-CT FINDINGS: No pneumothorax. Near complete opacification of left hemithorax with central air bronchograms. Heart size difficult to assess due to adjacent opacities. On the right, diffuse pulmonary vascular congestion. Lytic lesions in bilateral ribs are identified, with probable pathologic fracture involving the right sixth rib. IMPRESSION: 1. No pneumothorax post left thoracentesis, with persistent opacification of the left hemithorax. Electronically Signed   By: Lucrezia Europe M.D.   On: 03/28/2021 14:11   DG Chest Port 1 View  Result Date: 03/15/2021 CLINICAL DATA:  Post bronchoscopy on the left EXAM: PORTABLE CHEST 1 VIEW COMPARISON:  CT chest 03/15/2021 FINDINGS: Mild to moderate left pleural effusion. Left lower lobe  consolidation. No pneumothorax. Mild right lower lobe airspace disease appears new from the CT today. There is mild vascular congestion which may be due to mild fluid overload IMPRESSION: No pneumothorax post bronchoscopy Persistent left pleural effusion and left lower lobe consolidation/mass Progression of right lower lobe airspace disease Pulmonary vascular  congestion.  Question fluid overload Electronically Signed   By: Franchot Gallo M.D.   On: 03/15/2021 15:55   DG C-Arm 1-60 Min-No Report  Result Date: 03/15/2021 Fluoroscopy was utilized by the requesting  physician.  No radiographic interpretation.   CT Super D Chest Wo Contrast  Result Date: 03/15/2021 CLINICAL DATA:  Recent pneumonia.  Cough. EXAM: CT CHEST WITHOUT CONTRAST TECHNIQUE: Multidetector CT imaging of the chest was performed using thin slice collimation for electromagnetic bronchoscopy planning purposes, without intravenous contrast. COMPARISON:  02/23/2021 FINDINGS: Cardiovascular: The heart size is normal. No substantial pericardial effusion. No thoracic aortic aneurysm. Mediastinum/Nodes: 10 mm short axis right paratracheal node evident. 10 mm short axis subcarinal lymph node associated. No definite right hilar lymphadenopathy on this noncontrast study. Soft tissue fullness noted in the left hilum although assessment limited by lack of intravenous contrast material. The esophagus has normal imaging features. There is no axillary lymphadenopathy. Lungs/Pleura: Diffuse micro nodularity again noted bilaterally with an upper lung predominance. The collapse/consolidative change in the left lower lobe is similar to prior with 6.1 x 4.0 cm measured masslike consolidative opacity stable at 6.1 x 4.5 cm today. Small left pleural effusion evident. Upper Abdomen: Nodular soft tissue attenuation in the left upper quadrant/omental region is again noted. Musculoskeletal: 11 mm lucency is identified in the T10 vertebral body (43/2) IMPRESSION: 1. No  substantial interval change in the dense consolidative opacity involving the left lower lobe with associated small left pleural effusion. 2. Bilateral relatively symmetric micro nodularity demonstrates an upper lobe predominance, unchanged. This may be infectious/inflammatory in etiology. 3. Similar appearance of the ill-defined nodular soft tissue involving the anterior left upper quadrant, likely omental. Given that metastatic disease could certainly have this appearance, dedicated abdomen/pelvis CT with oral and intravenous contrast recommended to further evaluate. Electronically Signed   By: Misty Stanley M.D.   On: 03/15/2021 12:48   US THORACENTESIS ASP PLEURAL SPACE W/IMG GUIDE  Result Date: 03/28/2021 INDICATION: Non-small cell lung carcinoma, metastatic.  Left pleural effusion. EXAM: ULTRASOUND GUIDED LEFT THORACENTESIS MEDICATIONS: Lidocaine 1% subcutaneous COMPLICATIONS: None immediate.  No pneumothorax on follow-up radiograph. PROCEDURE: An ultrasound guided thoracentesis was thoroughly discussed with the patient and questions answered. The benefits, risks, alternatives and complications were also discussed. The patient understands and wishes to proceed with the procedure. Written consent was obtained. Ultrasound was performed to localize and mark an adequate pocket of fluid in the left chest. The area was then prepped and draped in the normal sterile fashion. 1% Lidocaine was used for local anesthesia. Under ultrasound guidance a 6 Fr Safe-T-Centesis catheter was introduced. Thoracentesis was performed. The catheter was removed and a dressing applied. FINDINGS: A total of approximately 1.2 L of clear yellow fluid was removed. IMPRESSION: Successful ultrasound guided left thoracentesis yielding 1.2 L of pleural fluid. Electronically Signed   By: Lucrezia Europe M.D.   On: 03/28/2021 14:38     Assessment/Plan 1.  Non-small cell lung carcinoma stage IV: Patient will undergo Infuse-a-Port placement and  special procedures using conscious sedation.  Risks and benefits of been reviewed all questions answered patient has agreed to proceed   Hortencia Pilar, MD  03/28/2021 3:16 PM

## 2021-03-28 NOTE — Op Note (Signed)
OPERATIVE NOTE   PROCEDURE: Placement of a right IJ Infuse-a-Port  PRE-OPERATIVE DIAGNOSIS: Non-small cell lung carcinoma stage IV  POST-OPERATIVE DIAGNOSIS: Same  SURGEON: Katha Cabal M.D.  ANESTHESIA: Conscious sedation was administered under my direct supervision by the interventional radiology RN. IV Versed plus fentanyl were utilized. Continuous ECG, pulse oximetry and blood pressure was monitored throughout the entire procedure. Conscious sedation was for a total of 34 minutes.  ESTIMATED BLOOD LOSS: Minimal   FINDING(S): 1.  Patent vein  SPECIMEN(S): None  INDICATIONS:   [name] is a 40 y.o. male who presents with metastatic non-small cell lung carcinoma.  He will require chemotherapy and therefore requires appropriate parenteral access.  Risks and benefits have been reviewed all questions have been answered patient agrees to proceed.  DESCRIPTION: After obtaining full informed written consent, the patient was brought back to the special procedure suite and placed in the supine position. The patient's right neck and chest wall are prepped and draped in sterile fashion. Appropriate timeout was called.  Ultrasound is placed in a sterile sleeve, ultrasound is utilized to avoid vascular injury as well as secondary to lack of appropriate landmarks. The right vein is identified. It is echolucent and homogeneous as well as easily compressible indicating patency. An image is recorded for the permanent record.  Access to the vein with a micropuncture needle is done under direct ultrasound visualization.  1% lidocaine is infiltrated into the soft tissue at the base of the neck as well as on the chest wall.  Under direct ultrasound visualization a micro-needle is inserted into the vein followed by the micro-wire. Micro-sheath was then advanced and a J wire is inserted without difficulty under fluoroscopic guidance. A small counterincision was created at the wire insertion site.  A transverse incision is created 2 fingerbreadths below the scapula and a pocket is fashioned using both blunt and sharp dissection. The pocket is tested for appropriate size with the hub of the Infuse-a-Port. The tunneling device is then used to pull the intravascular portion of the catheter from the pocket to the neck counterincision.  Dilator and peel-away sheath were then inserted over the wire and the wire is removed. Catheter is then advanced into the venous system without difficulty. Peel-away sheath was then removed.  Catheter is then positioned under fluoroscopic guidance at the atrial caval junction. It is then transected connected to the hub and the hope is slipped into the subcutaneous pocket on the chest wall. The hub was then accessed percutaneously and aspirates easily and flushes well and is flushed with 30 cc of heparinized saline. The pocket incision is then closed in layers using interrupted 3-0 Vicryl for the subcutaneous tissues and 4-0 Monocryl subcuticular for skin closure. Dermabond is applied. The neck counterincision was closed with 4-0 Monocryl subcuticular and Dermabond as well.  The patient tolerated the procedure well and there were no immediate complications.  COMPLICATIONS: None  CONDITION: Unchanged  Katha Cabal M.D. [facility] Office: [phone]   03/28/2021, 4:31 PM

## 2021-03-28 NOTE — Consult Note (Signed)
Cardiology Consultation Note    Patient ID: [name], MRN: [id number], DOB/AGE: 19-Apr-1981 40 y.o. Admit date: 03/28/2021   Date of Consult: 03/28/2021 Primary Physician: Pcp, No Primary Cardiologist: Dr. Delana Meyer  Chief Complaint: tachycardia Reason for Consultation: svt Requesting MD: Dr. Delana Meyer  HPI: [name] is a 40 y.o. male with history of Wolff-Parkinson-White syndrome, lung carcinoma who developed narrow complex tachycardia post port placement and thoracentesis today.  Rate was around 190.  It appears the delta wave was not present during the tachycardia.  He received IV Cardizem with no improvement.  6 mg of IV Identicard broke the rhythm back to sinus rhythm.  He currently is in sinus rhythm and doing well.  Past Medical History:  Diagnosis Date   Cancer ([place])    Dyspnea    Heartburn    Pneumonia    Wolff-Parkinson-White (WPW) 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      Surgical History:  Past Surgical History:  Procedure Laterality Date   FRACTURE SURGERY     broke femur when he was 6 years   VIDEO BRONCHOSCOPY WITH ENDOBRONCHIAL NAVIGATION N/A 03/15/2021   Procedure: VIDEO BRONCHOSCOPY WITH ENDOBRONCHIAL NAVIGATION;  Surgeon: Ottie Glazier, MD;  Location: [facility];  Service: Thoracic;  Laterality: N/A;   VIDEO BRONCHOSCOPY WITH ENDOBRONCHIAL ULTRASOUND N/A 03/15/2021   Procedure: VIDEO BRONCHOSCOPY WITH ENDOBRONCHIAL ULTRASOUND;  Surgeon: Ottie Glazier, MD;  Location: [facility];  Service: Thoracic;  Laterality: N/A;     Home Meds: Prior to Admission medications   Medication Sig Start Date End Date Taking? Authorizing Provider  amoxicillin-clavulanate (AUGMENTIN) 500-125 MG tablet Take 1 tablet by mouth in the morning and at bedtime. 03/20/21 03/30/21 Yes [provider]  chlorpheniramine-HYDROcodone (TUSSIONEX) 10-8 MG/5ML SUER Take 5 mLs by mouth every 12 (twelve) hours as needed for cough. 03/23/21  Yes [name], MD  HYDROcodone-acetaminophen  (NORCO/VICODIN) 5-325 MG tablet Take 1 tablet by mouth every 6 (six) hours as needed for moderate pain. 03/23/21  Yes [name], MD  dexamethasone (DECADRON) 4 MG tablet Take one pill AM & PM for 2 days. TAKE day prior & day AFTER chemo. Do NOT take on day of chemo. 03/23/21   [name], MD  folic acid (FOLVITE) 1 MG tablet Take 1 tablet (1 mg total) by mouth daily. 03/23/21   [name], MD  lidocaine-prilocaine (EMLA) cream Apply 1 application topically as needed. 03/23/21   [name], MD  ondansetron (ZOFRAN) 8 MG tablet One pill every 8 hours as needed for nausea/vomitting. 03/23/21   [name], MD  prochlorperazine (COMPAZINE) 10 MG tablet Take 1 tablet (10 mg total) by mouth every 6 (six) hours as needed for nausea or vomiting. 03/23/21   [name], MD    Inpatient Medications:   Chlorhexidine Gluconate Cloth  6 each Topical Q0600   chlorpheniramine-HYDROcodone       fentaNYL       midazolam       midazolam        sodium chloride 75 mL/hr at 03/28/21 1438    ceFAZolin (ANCEF) IV      Allergies:  Allergies  Allergen Reactions   Codeine     Makes pt hyper    Social History   Socioeconomic History   Marital status: Married    Spouse name: [name]   Number of children: 2   Years of education: Not on file   Highest education level: Not  on file  Occupational History   Not on file  Tobacco Use   Smoking status: Never   Smokeless tobacco: Current    Types: Snuff, Chew  Vaping Use   Vaping Use: Never used  Substance and Sexual Activity   Alcohol use: Never   Drug use: Never   Sexual activity: Not on file  Other Topics Concern   Not on file  Social History Narrative   [place]; with wife; 2 daughters[12 and 22]; never smoked; rare alcohol. Work in [employer]    Social Determinants of Health   Financial Resource Strain: Not on file  Food Insecurity: Not on file  Transportation Needs: Not on file   Physical Activity: Not on file  Stress: Not on file  Social Connections: Not on file  Intimate Partner Violence: Not on file     Family History  Problem Relation Age of Onset   High Cholesterol Mother    Hypertension Father    Cancer Father    Lung cancer Father    Skin cancer Father      Review of Systems: A 12-system review of systems was performed and is negative except as noted in the HPI.  Labs: No results for input(s): CKTOTAL, CKMB, TROPONINI in the last 72 hours. Lab Results  Component Value Date   WBC 10.8 (H) 03/10/2021   HGB 15.1 03/10/2021   HCT 44.1 03/10/2021   MCV 92.3 03/10/2021   PLT 342 03/10/2021    Recent Labs  Lab 03/23/21 1233  NA 133*  K 4.2  CL 95*  CO2 24  BUN 17  CREATININE 0.82  CALCIUM 8.7*  PROT 7.1  BILITOT 0.9  ALKPHOS 399*  ALT 97*  AST 50*  GLUCOSE 96   No results found for: CHOL, HDL, LDLCALC, TRIG No results found for: DDIMER  Radiology/Studies:  NM PET Image Initial (PI) Skull Base To Thigh  Result Date: 03/28/2021 CLINICAL DATA:  Initial treatment strategy for non-small cell lung carcinoma. EXAM: NUCLEAR MEDICINE PET SKULL BASE TO THIGH TECHNIQUE: 10.7 mCi F-18 FDG was injected intravenously. Full-ring PET imaging was performed from the skull base to thigh after the radiotracer. CT data was obtained and used for attenuation correction and anatomic localization. Fasting blood glucose: 90 mg/dl COMPARISON:  03/15/2021 FINDINGS: Mediastinal blood-pool activity (background): SUV max = 2.6 Liver activity (reference): SUV max = N/A NECK: 6 mm left supraclavicular lymph node on image 70/2 is hypermetabolic, with SUV max of 3.6. Incidental CT findings:  None. CHEST: Left lung collapse is seen with a large hypermetabolic masslike opacity in the perihilar region and left lower lobe, which measures approximately 7.0 x 5.8 cm on image 101/3 and has SUV max of 17.5. Hypermetabolic activity in the collapsed left upper lobe is likely  secondary to atelectasis. Large left pleural effusion causes mediastinal shift to the right and shows pleural base areas mild FDG uptake, highly suspicious for malignant pleural effusion. A 10 mm subcarinal lymph node shows FDG uptake with SUV max of 3.9. A 10 mm right paratracheal lymph node shows FDG uptake with SUV max of 3.9. Incidental CT findings:  None. ABDOMEN/PELVIS: No abnormal hypermetabolic activity within the liver, pancreas, adrenal glands, or spleen. 1.7 cm hypermetabolic soft tissue density in the splenic hilum has SUV max of 6.2, and may represent a hypermetabolic lymph node or peritoneal implant. Ill-defined soft tissue density in the left upper quadrant omental fat is hypermetabolic with SUV max of 4.5, consistent with omental carcinomatosis.  Hypermetabolic activity is seen involving the peritoneum in dependent pelvis, also suspicious for peritoneal carcinomatosis. No other hypermetabolic soft tissue masses or lymphadenopathy identified. Incidental CT findings:  None. SKELETON: Numerous hypermetabolic bone metastases are seen in the bilateral scapulae, bilateral ribs, thoracic and lumbosacral spine, and pelvis, most of which are lytic in appearance. Incidental CT findings:  None. IMPRESSION: Left lung collapse with 7 cm hypermetabolic masslike opacity centered in the left lower lobe and involving the left hilum, consistent with primary bronchogenic carcinoma. Large malignant left pleural effusion. Hypermetabolic lymphadenopathy in mediastinum and left supraclavicular region. Peritoneal/omental carcinomatosis. Diffuse bone metastases. Electronically Signed   By: Marlaine Hind M.D.   On: 03/28/2021 09:59   DG Chest Port 1 View  Result Date: 03/28/2021 CLINICAL DATA:  Pleural effusion left, s/p thora EXAM: PORTABLE CHEST - 1 VIEW COMPARISON:  03/27/2021 PET-CT FINDINGS: No pneumothorax. Near complete opacification of left hemithorax with central air bronchograms. Heart size difficult to assess due  to adjacent opacities. On the right, diffuse pulmonary vascular congestion. Lytic lesions in bilateral ribs are identified, with probable pathologic fracture involving the right sixth rib. IMPRESSION: 1. No pneumothorax post left thoracentesis, with persistent opacification of the left hemithorax. Electronically Signed   By: Lucrezia Europe M.D.   On: 03/28/2021 14:11   DG Chest Port 1 View  Result Date: 03/15/2021 CLINICAL DATA:  Post bronchoscopy on the left EXAM: PORTABLE CHEST 1 VIEW COMPARISON:  CT chest 03/15/2021 FINDINGS: Mild to moderate left pleural effusion. Left lower lobe consolidation. No pneumothorax. Mild right lower lobe airspace disease appears new from the CT today. There is mild vascular congestion which may be due to mild fluid overload IMPRESSION: No pneumothorax post bronchoscopy Persistent left pleural effusion and left lower lobe consolidation/mass Progression of right lower lobe airspace disease Pulmonary vascular  congestion.  Question fluid overload Electronically Signed   By: Franchot Gallo M.D.   On: 03/15/2021 15:55   DG C-Arm 1-60 Min-No Report  Result Date: 03/15/2021 Fluoroscopy was utilized by the requesting physician.  No radiographic interpretation.   CT Super D Chest Wo Contrast  Result Date: 03/15/2021 CLINICAL DATA:  Recent pneumonia.  Cough. EXAM: CT CHEST WITHOUT CONTRAST TECHNIQUE: Multidetector CT imaging of the chest was performed using thin slice collimation for electromagnetic bronchoscopy planning purposes, without intravenous contrast. COMPARISON:  02/23/2021 FINDINGS: Cardiovascular: The heart size is normal. No substantial pericardial effusion. No thoracic aortic aneurysm. Mediastinum/Nodes: 10 mm short axis right paratracheal node evident. 10 mm short axis subcarinal lymph node associated. No definite right hilar lymphadenopathy on this noncontrast study. Soft tissue fullness noted in the left hilum although assessment limited by lack of intravenous contrast  material. The esophagus has normal imaging features. There is no axillary lymphadenopathy. Lungs/Pleura: Diffuse micro nodularity again noted bilaterally with an upper lung predominance. The collapse/consolidative change in the left lower lobe is similar to prior with 6.1 x 4.0 cm measured masslike consolidative opacity stable at 6.1 x 4.5 cm today. Small left pleural effusion evident. Upper Abdomen: Nodular soft tissue attenuation in the left upper quadrant/omental region is again noted. Musculoskeletal: 11 mm lucency is identified in the T10 vertebral body (43/2) IMPRESSION: 1. No substantial interval change in the dense consolidative opacity involving the left lower lobe with associated small left pleural effusion. 2. Bilateral relatively symmetric micro nodularity demonstrates an upper lobe predominance, unchanged. This may be infectious/inflammatory in etiology. 3. Similar appearance of the ill-defined nodular soft tissue involving the anterior left upper quadrant, likely omental.  Given that metastatic disease could certainly have this appearance, dedicated abdomen/pelvis CT with oral and intravenous contrast recommended to further evaluate. Electronically Signed   By: Misty Stanley M.D.   On: 03/15/2021 12:48   US THORACENTESIS ASP PLEURAL SPACE W/IMG GUIDE  Result Date: 03/28/2021 INDICATION: Non-small cell lung carcinoma, metastatic.  Left pleural effusion. EXAM: ULTRASOUND GUIDED LEFT THORACENTESIS MEDICATIONS: Lidocaine 1% subcutaneous COMPLICATIONS: None immediate.  No pneumothorax on follow-up radiograph. PROCEDURE: An ultrasound guided thoracentesis was thoroughly discussed with the patient and questions answered. The benefits, risks, alternatives and complications were also discussed. The patient understands and wishes to proceed with the procedure. Written consent was obtained. Ultrasound was performed to localize and mark an adequate pocket of fluid in the left chest. The area was then prepped  and draped in the normal sterile fashion. 1% Lidocaine was used for local anesthesia. Under ultrasound guidance a 6 Fr Safe-T-Centesis catheter was introduced. Thoracentesis was performed. The catheter was removed and a dressing applied. FINDINGS: A total of approximately 1.2 L of clear yellow fluid was removed. IMPRESSION: Successful ultrasound guided left thoracentesis yielding 1.2 L of pleural fluid. Electronically Signed   By: Lucrezia Europe M.D.   On: 03/28/2021 14:38    Wt Readings from Last 3 Encounters:  03/28/21 94.3 kg  03/23/21 92.7 kg  03/15/21 98 kg    EKG: Initially complex SVT currently sinus rhythm  Physical Exam:  Blood pressure 123/87, pulse 100, resp. rate 17, height 5\' 10"  (1.778 m), weight 94.3 kg, SpO2 93 %. Body mass index is 29.84 kg/m. General: Well developed, well nourished, in no acute distress. Head: Normocephalic, atraumatic, sclera non-icteric, no xanthomas, nares are without discharge.  Neck: Negative for carotid bruits. JVD not elevated. Lungs: Clear bilaterally to auscultation without wheezes, rales, or rhonchi. Breathing is unlabored. Heart: RRR with S1 S2. No murmurs, rubs, or gallops appreciated. Abdomen: Soft, non-tender, non-distended with normoactive bowel sounds. No hepatomegaly. No rebound/guarding. No obvious abdominal masses. Msk:  Strength and tone appear normal for age. Extremities: No clubbing or cyanosis. No edema.  Distal pedal pulses are 2+ and equal bilaterally. Neuro: Alert and oriented X 3. No facial asymmetry. No focal deficit. Moves all extremities spontaneously. Psych:  Responds to questions appropriately with a normal affect.     Assessment and Plan  Patient with history of Wolff-Parkinson-White syndrome clinically developed narrow complex tachycardia post port placement and thoracentesis.  Required Identicard to convert back to sinus rhythm.  Currently doing well.  Would recommend monitoring telemetry overnight and if stable consider  discharge in the morning.  Signed, Teodoro Spray MD 03/28/2021, 4:22 PM Pager: [phone]

## 2021-03-28 NOTE — Plan of Care (Signed)

## 2021-03-29 ENCOUNTER — Other Ambulatory Visit: Payer: Self-pay

## 2021-03-29 ENCOUNTER — Inpatient Hospital Stay: Payer: Managed Care, Other (non HMO)

## 2021-03-29 ENCOUNTER — Ambulatory Visit: Payer: Managed Care, Other (non HMO)

## 2021-03-29 ENCOUNTER — Encounter: Payer: Self-pay | Admitting: Vascular Surgery

## 2021-03-29 ENCOUNTER — Inpatient Hospital Stay: Payer: Managed Care, Other (non HMO) | Admitting: Internal Medicine

## 2021-03-29 DIAGNOSIS — Z515 Encounter for palliative care: Secondary | ICD-10-CM

## 2021-03-29 DIAGNOSIS — I471 Supraventricular tachycardia: Secondary | ICD-10-CM | POA: Diagnosis not present

## 2021-03-29 DIAGNOSIS — C3492 Malignant neoplasm of unspecified part of left bronchus or lung: Secondary | ICD-10-CM

## 2021-03-29 LAB — PH, BODY FLUID: pH, Body Fluid: 7.3

## 2021-03-29 LAB — BASIC METABOLIC PANEL
Anion gap: 7 (ref 5–15)
BUN: 13 mg/dL (ref 6–20)
CO2: 28 mmol/L (ref 22–32)
Calcium: 8.2 mg/dL — ABNORMAL LOW (ref 8.9–10.3)
Chloride: 97 mmol/L — ABNORMAL LOW (ref 98–111)
Creatinine, Ser: 0.76 mg/dL (ref 0.61–1.24)
GFR, Estimated: >60 mL/min (ref >60)
Glucose, Bld: 106 mg/dL — ABNORMAL HIGH (ref 70–99)
Potassium: 4.3 mmol/L (ref 3.5–5.1)
Sodium: 132 mmol/L — ABNORMAL LOW (ref 135–145)

## 2021-03-29 LAB — CBC
HCT: 43.5 % (ref 39.0–52.0)
Hemoglobin: 15.3 g/dL (ref 13.0–17.0)
MCH: 32.1 pg (ref 26.0–34.0)
MCHC: 35.2 g/dL (ref 30.0–36.0)
MCV: 91.2 fL (ref 80.0–100.0)
Platelets: 440 10*3/uL — ABNORMAL HIGH (ref 150–400)
RBC: 4.77 MIL/uL (ref 4.22–5.81)
RDW: 11.9 % (ref 11.5–15.5)
WBC: 16.9 10*3/uL — ABNORMAL HIGH (ref 4.0–10.5)
nRBC: 0 % (ref 0.0–0.2)

## 2021-03-29 LAB — ACID FAST SMEAR (AFB, MYCOBACTERIA): Acid Fast Smear: NEGATIVE

## 2021-03-29 MED ORDER — LIDOCAINE-PRILOCAINE 2.5-2.5 % EX CREA
TOPICAL_CREAM | Freq: Once | CUTANEOUS | Status: AC
  Filled 2021-03-29: qty 5

## 2021-03-29 MED ORDER — DOCUSATE SODIUM 100 MG PO CAPS
100.0000 mg | ORAL_CAPSULE | Freq: Two times a day (BID) | ORAL | Status: DC
  Administered 2021-03-29 – 2021-03-30 (×3): 100 mg via ORAL
  Filled 2021-03-29 (×3): qty 1

## 2021-03-29 MED ORDER — BISACODYL 5 MG PO TBEC
10.0000 mg | DELAYED_RELEASE_TABLET | Freq: Every day | ORAL | Status: DC
  Administered 2021-03-29 – 2021-03-30 (×2): 10 mg via ORAL
  Filled 2021-03-29 (×2): qty 2

## 2021-03-29 MED ORDER — LORAZEPAM 0.5 MG PO TABS: 0.5000 mg | ORAL_TABLET | Freq: Four times a day (QID) | ORAL | Status: DC | PRN

## 2021-03-29 MED ORDER — METOPROLOL TARTRATE 25 MG PO TABS
12.5000 mg | ORAL_TABLET | Freq: Two times a day (BID) | ORAL | Status: DC
  Administered 2021-03-29 – 2021-03-30 (×3): 12.5 mg via ORAL
  Filled 2021-03-29 (×3): qty 1

## 2021-03-29 MED ORDER — SERTRALINE HCL 50 MG PO TABS
25.0000 mg | ORAL_TABLET | Freq: Every day | ORAL | Status: DC
  Administered 2021-03-29 – 2021-03-30 (×2): 25 mg via ORAL
  Filled 2021-03-29 (×2): qty 1

## 2021-03-29 MED ORDER — DEXAMETHASONE 4 MG PO TABS
4.0000 mg | ORAL_TABLET | Freq: Two times a day (BID) | ORAL | Status: DC
  Administered 2021-03-29 – 2021-03-30 (×2): 4 mg via ORAL
  Filled 2021-03-29 (×3): qty 1

## 2021-03-29 MED ORDER — CHLORHEXIDINE GLUCONATE CLOTH 2 % EX PADS
6.0000 | MEDICATED_PAD | Freq: Every day | CUTANEOUS | Status: DC
  Administered 2021-03-30: 6 via TOPICAL

## 2021-03-29 NOTE — Consult Note (Signed)
[place] CONSULT NOTE  Patient Care Team: Pcp, No as PCP - General [name], RN as Oncology Nurse Navigator  CHIEF COMPLAINTS/PURPOSE OF CONSULTATION: Lung cancer/SVT  HISTORY OF PRESENTING ILLNESS:  [name] 40 y.o.  male with history of congenital Wolff-Parkinson-White syndrome; and newly diagnosed adenocarcinoma lung is currently admitted to hospital for an episode of supraventricular tachycardia post Mediport placement.  Patient also underwent 1.2 L of left-sided thoracentesis.  Post Mediport placement patient noted to be tachycardic with heart rates in the 180s; s/p IV Cardizem.  Patient received IV Adenosine with conversion to sinus rhythm.  S/p evaluation by cardiology.  Patient is currently clinically stable.  Shortness of breath on exertion.  Denies any headaches but denies any nausea vomiting.  Review of Systems  Constitutional:  Positive for diaphoresis, malaise/fatigue and weight loss. Negative for chills and fever.  HENT:  Negative for nosebleeds and sore throat.   Eyes:  Negative for double vision.  Respiratory:  Positive for cough and shortness of breath. Negative for hemoptysis, sputum production and wheezing.   Cardiovascular:  Positive for palpitations. Negative for chest pain, orthopnea and leg swelling.  Gastrointestinal:  Negative for abdominal pain, blood in stool, constipation, diarrhea, heartburn, melena, nausea and vomiting.  Genitourinary:  Negative for dysuria, frequency and urgency.  Musculoskeletal:  Negative for back pain and joint pain.  Skin: Negative.  Negative for itching and rash.  Neurological:  Negative for dizziness, tingling, focal weakness, weakness and headaches.  Endo/Heme/Allergies:  Does not bruise/bleed easily.  Psychiatric/Behavioral:  Negative for depression. The patient is not nervous/anxious and does not have insomnia.     MEDICAL HISTORY:  Past Medical History:  Diagnosis Date   Cancer ([place])    Dyspnea     Heartburn    Pneumonia    Wolff-Parkinson-White (WPW) 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    SURGICAL HISTORY: Past Surgical History:  Procedure Laterality Date   FRACTURE SURGERY     broke femur when he was 6 years   PORTA CATH INSERTION N/A 03/28/2021   Procedure: PORTA CATH INSERTION;  Surgeon: Katha Cabal, MD;  Location: [place] [facility];  Service: Cardiovascular;  Laterality: N/A;   VIDEO BRONCHOSCOPY WITH ENDOBRONCHIAL NAVIGATION N/A 03/15/2021   Procedure: VIDEO BRONCHOSCOPY WITH ENDOBRONCHIAL NAVIGATION;  Surgeon: Ottie Glazier, MD;  Location: [facility];  Service: Thoracic;  Laterality: N/A;   VIDEO BRONCHOSCOPY WITH ENDOBRONCHIAL ULTRASOUND N/A 03/15/2021   Procedure: VIDEO BRONCHOSCOPY WITH ENDOBRONCHIAL ULTRASOUND;  Surgeon: Ottie Glazier, MD;  Location: [facility];  Service: Thoracic;  Laterality: N/A;    SOCIAL HISTORY: Social History   Socioeconomic History   Marital status: Married    Spouse name: [name]   Number of children: 2   Years of education: Not on file   Highest education level: Not on file  Occupational History   Not on file  Tobacco Use   Smoking status: Never   Smokeless tobacco: Current    Types: Snuff, Chew  Vaping Use   Vaping Use: Never used  Substance and Sexual Activity   Alcohol use: Never   Drug use: Never   Sexual activity: Not on file  Other Topics Concern   Not on file  Social History Narrative   [place]; with wife; 2 daughters[12 and 22]; never smoked; rare alcohol. Work in [employer]    Social Determinants of Health   Financial Resource Strain: Not on file  Food Insecurity: Not on file  Transportation Needs: Not on file  Physical Activity: Not on file  Stress: Not on file  Social Connections: Not on file  Intimate Partner Violence: Not on file    FAMILY HISTORY: Family History  Problem Relation Age of Onset   High Cholesterol Mother    Hypertension Father    Cancer Father    Lung cancer Father    Skin  cancer Father     ALLERGIES:  is allergic to codeine.  MEDICATIONS:  Current Facility-Administered Medications  Medication Dose Route Frequency Provider Last Rate Last Admin   0.9 %  sodium chloride infusion  250 mL Intravenous PRN Agbata, Tochukwu, MD       amoxicillin-clavulanate (AUGMENTIN) 500-125 MG per tablet 500 mg  1 tablet Oral BID Agbata, Tochukwu, MD   500 mg at 03/29/21 2139   bisacodyl (DULCOLAX) EC tablet 10 mg  10 mg Oral Daily Fritzi Mandes, MD   10 mg at 03/29/21 1404   Chlorhexidine Gluconate Cloth 2 % PADS 6 each  6 each Topical Daily Agbata, Tochukwu, MD       chlorpheniramine-HYDROcodone (TUSSIONEX) 10-8 MG/5ML suspension 5 mL  5 mL Oral Q12H PRN Agbata, Tochukwu, MD   5 mL at 03/28/21 2101   dexamethasone (DECADRON) tablet 4 mg  4 mg Oral Q12H Charlaine Dalton R, MD   4 mg at 03/29/21 1752   docusate sodium (COLACE) capsule 100 mg  100 mg Oral BID Fritzi Mandes, MD   100 mg at 25/42/70 6237   folic acid (FOLVITE) tablet 1 mg  1 mg Oral Daily Agbata, Tochukwu, MD   1 mg at 03/29/21 1017   HYDROcodone-acetaminophen (NORCO/VICODIN) 5-325 MG per tablet 1 tablet  1 tablet Oral Q6H PRN Agbata, Tochukwu, MD   1 tablet at 03/29/21 2140   HYDROmorphone (DILAUDID) injection 1 mg  1 mg Intravenous Once PRN Kris Hartmann, NP       [START ON 03/30/2021] lidocaine-prilocaine (EMLA) cream   Topical Once [name], MD       LORazepam (ATIVAN) tablet 0.5 mg  0.5 mg Oral Q6H PRN Borders, Kirt Boys, NP       metoprolol tartrate (LOPRESSOR) tablet 12.5 mg  12.5 mg Oral BID Fritzi Mandes, MD   12.5 mg at 03/29/21 2139   ondansetron (ZOFRAN) tablet 4 mg  4 mg Oral Q6H PRN Agbata, Tochukwu, MD       Or   ondansetron (ZOFRAN) injection 4 mg  4 mg Intravenous Q6H PRN Agbata, Tochukwu, MD       sertraline (ZOLOFT) tablet 25 mg  25 mg Oral Daily Borders, Joshua R, NP   25 mg at 03/29/21 1752   sodium chloride flush (NS) 0.9 % injection 3 mL  3 mL Intravenous Q12H Agbata, Tochukwu, MD   3  mL at 03/29/21 2145   sodium chloride flush (NS) 0.9 % injection 3 mL  3 mL Intravenous PRN Agbata, Tochukwu, MD          .  PHYSICAL EXAMINATION:  Vitals:   03/29/21 1542 03/29/21 1946  BP: 127/79 (!) 143/87  Pulse: 90 (!) 102  Resp: 18 18  Temp: 98 F (36.7 C) 98.4 F (36.9 C)  SpO2: 95% 95%   Filed Weights   03/28/21 1422  Weight: 208 lb (94.3 kg)    Physical Exam Constitutional:      Comments: Ambulating independently/assistive devices.  Alone/family  HENT:     Head: Normocephalic and atraumatic.     Mouth/Throat:  Pharynx: No oropharyngeal exudate.  Eyes:     Pupils: Pupils are equal, round, and reactive to light.  Cardiovascular:     Rate and Rhythm: Normal rate and regular rhythm.  Pulmonary:     Effort: No respiratory distress.     Breath sounds: No wheezing.     Comments: Decreased breath sounds left more than right. Abdominal:     General: Bowel sounds are normal. There is no distension.     Palpations: Abdomen is soft. There is no mass.     Tenderness: There is no abdominal tenderness. There is no guarding or rebound.  Musculoskeletal:        General: No tenderness. Normal range of motion.     Cervical back: Normal range of motion and neck supple.  Skin:    General: Skin is warm.  Neurological:     Mental Status: He is alert and oriented to person, place, and time.  Psychiatric:        Mood and Affect: Affect normal.     LABORATORY DATA:  I have reviewed the data as listed Lab Results  Component Value Date   WBC 16.9 (H) 03/29/2021   HGB 15.3 03/29/2021   HCT 43.5 03/29/2021   MCV 91.2 03/29/2021   PLT 440 (H) 03/29/2021   Recent Labs    03/23/21 1233 03/29/21 0632  NA 133* 132*  K 4.2 4.3  CL 95* 97*  CO2 24 28  GLUCOSE 96 106*  BUN 17 13  CREATININE 0.82 0.76  CALCIUM 8.7* 8.2*  GFRNONAA >60 >60  PROT 7.1  --   ALBUMIN 3.2*  --   AST 50*  --   ALT 97*  --   ALKPHOS 399*  --   BILITOT 0.9  --     RADIOGRAPHIC  STUDIES: I have personally reviewed the radiological images as listed and agreed with the findings in the report. MR BRAIN W WO CONTRAST  Result Date: 03/28/2021 CLINICAL DATA:  Staging for lung mass EXAM: MRI HEAD WITHOUT AND WITH CONTRAST TECHNIQUE: Multiplanar, multiecho pulse sequences of the brain and surrounding structures were obtained without and with intravenous contrast. CONTRAST:  17m GADAVIST GADOBUTROL 1 MMOL/ML IV SOLN COMPARISON:  None. FINDINGS: Brain: There are numerous contrast-enhancing lesions throughout the brain. The largest lesions are in the right frontal and parietal lobes. The largest right parietal lesion measures 11 mm with moderate surrounding edema. The largest right frontal lesion measures 10 mm with mild surrounding edema. There are greater than 20 lesions in total. No midline shift or other mass effect. No acute infarct. No acute hemorrhage. Ventricular sizes are normal. Vascular: Normal flow voids. Skull and upper cervical spine: Normal marrow signal. Sinuses/Orbits: Negative. Other: None. IMPRESSION: Numerous scattered intraparenchymal metastases. The largest lesions are in the right frontal and parietal lobes with moderate surrounding edema. No midline shift or other mass effect. Electronically Signed   By: KUlyses JarredM.D.   On: 03/28/2021 22:27   PERIPHERAL VASCULAR CATHETERIZATION  Result Date: 03/29/2021 See surgical note for result.  NM PET Image Initial (PI) Skull Base To Thigh  Result Date: 03/28/2021 CLINICAL DATA:  Initial treatment strategy for non-small cell lung carcinoma. EXAM: NUCLEAR MEDICINE PET SKULL BASE TO THIGH TECHNIQUE: 10.7 mCi F-18 FDG was injected intravenously. Full-ring PET imaging was performed from the skull base to thigh after the radiotracer. CT data was obtained and used for attenuation correction and anatomic localization. Fasting blood glucose: 90 mg/dl COMPARISON:  03/15/2021 FINDINGS: Mediastinal blood-pool  activity (background):  SUV max = 2.6 Liver activity (reference): SUV max = N/A NECK: 6 mm left supraclavicular lymph node on image 43/3 is hypermetabolic, with SUV max of 3.6. Incidental CT findings:  None. CHEST: Left lung collapse is seen with a large hypermetabolic masslike opacity in the perihilar region and left lower lobe, which measures approximately 7.0 x 5.8 cm on image 101/3 and has SUV max of 17.5. Hypermetabolic activity in the collapsed left upper lobe is likely secondary to atelectasis. Large left pleural effusion causes mediastinal shift to the right and shows pleural base areas mild FDG uptake, highly suspicious for malignant pleural effusion. A 10 mm subcarinal lymph node shows FDG uptake with SUV max of 3.9. A 10 mm right paratracheal lymph node shows FDG uptake with SUV max of 3.9. Incidental CT findings:  None. ABDOMEN/PELVIS: No abnormal hypermetabolic activity within the liver, pancreas, adrenal glands, or spleen. 1.7 cm hypermetabolic soft tissue density in the splenic hilum has SUV max of 6.2, and may represent a hypermetabolic lymph node or peritoneal implant. Ill-defined soft tissue density in the left upper quadrant omental fat is hypermetabolic with SUV max of 4.5, consistent with omental carcinomatosis. Hypermetabolic activity is seen involving the peritoneum in dependent pelvis, also suspicious for peritoneal carcinomatosis. No other hypermetabolic soft tissue masses or lymphadenopathy identified. Incidental CT findings:  None. SKELETON: Numerous hypermetabolic bone metastases are seen in the bilateral scapulae, bilateral ribs, thoracic and lumbosacral spine, and pelvis, most of which are lytic in appearance. Incidental CT findings:  None. IMPRESSION: Left lung collapse with 7 cm hypermetabolic masslike opacity centered in the left lower lobe and involving the left hilum, consistent with primary bronchogenic carcinoma. Large malignant left pleural effusion. Hypermetabolic lymphadenopathy in mediastinum and  left supraclavicular region. Peritoneal/omental carcinomatosis. Diffuse bone metastases. Electronically Signed   By: Marlaine Hind M.D.   On: 03/28/2021 09:59   DG Chest Port 1 View  Result Date: 03/28/2021 CLINICAL DATA:  Pleural effusion left, s/p thora EXAM: PORTABLE CHEST - 1 VIEW COMPARISON:  03/27/2021 PET-CT FINDINGS: No pneumothorax. Near complete opacification of left hemithorax with central air bronchograms. Heart size difficult to assess due to adjacent opacities. On the right, diffuse pulmonary vascular congestion. Lytic lesions in bilateral ribs are identified, with probable pathologic fracture involving the right sixth rib. IMPRESSION: 1. No pneumothorax post left thoracentesis, with persistent opacification of the left hemithorax. Electronically Signed   By: Lucrezia Europe M.D.   On: 03/28/2021 14:11   DG Chest Port 1 View  Result Date: 03/15/2021 CLINICAL DATA:  Post bronchoscopy on the left EXAM: PORTABLE CHEST 1 VIEW COMPARISON:  CT chest 03/15/2021 FINDINGS: Mild to moderate left pleural effusion. Left lower lobe consolidation. No pneumothorax. Mild right lower lobe airspace disease appears new from the CT today. There is mild vascular congestion which may be due to mild fluid overload IMPRESSION: No pneumothorax post bronchoscopy Persistent left pleural effusion and left lower lobe consolidation/mass Progression of right lower lobe airspace disease Pulmonary vascular  congestion.  Question fluid overload Electronically Signed   By: Franchot Gallo M.D.   On: 03/15/2021 15:55   DG C-Arm 1-60 Min-No Report  Result Date: 03/15/2021 Fluoroscopy was utilized by the requesting physician.  No radiographic interpretation.   CT Super D Chest Wo Contrast  Result Date: 03/15/2021 CLINICAL DATA:  Recent pneumonia.  Cough. EXAM: CT CHEST WITHOUT CONTRAST TECHNIQUE: Multidetector CT imaging of the chest was performed using thin slice collimation for electromagnetic bronchoscopy planning purposes,  without  intravenous contrast. COMPARISON:  02/23/2021 FINDINGS: Cardiovascular: The heart size is normal. No substantial pericardial effusion. No thoracic aortic aneurysm. Mediastinum/Nodes: 10 mm short axis right paratracheal node evident. 10 mm short axis subcarinal lymph node associated. No definite right hilar lymphadenopathy on this noncontrast study. Soft tissue fullness noted in the left hilum although assessment limited by lack of intravenous contrast material. The esophagus has normal imaging features. There is no axillary lymphadenopathy. Lungs/Pleura: Diffuse micro nodularity again noted bilaterally with an upper lung predominance. The collapse/consolidative change in the left lower lobe is similar to prior with 6.1 x 4.0 cm measured masslike consolidative opacity stable at 6.1 x 4.5 cm today. Small left pleural effusion evident. Upper Abdomen: Nodular soft tissue attenuation in the left upper quadrant/omental region is again noted. Musculoskeletal: 11 mm lucency is identified in the T10 vertebral body (43/2) IMPRESSION: 1. No substantial interval change in the dense consolidative opacity involving the left lower lobe with associated small left pleural effusion. 2. Bilateral relatively symmetric micro nodularity demonstrates an upper lobe predominance, unchanged. This may be infectious/inflammatory in etiology. 3. Similar appearance of the ill-defined nodular soft tissue involving the anterior left upper quadrant, likely omental. Given that metastatic disease could certainly have this appearance, dedicated abdomen/pelvis CT with oral and intravenous contrast recommended to further evaluate. Electronically Signed   By: Misty Stanley M.D.   On: 03/15/2021 12:48   US THORACENTESIS ASP PLEURAL SPACE W/IMG GUIDE  Result Date: 03/28/2021 INDICATION: Non-small cell lung carcinoma, metastatic.  Left pleural effusion. EXAM: ULTRASOUND GUIDED LEFT THORACENTESIS MEDICATIONS: Lidocaine 1% subcutaneous  COMPLICATIONS: None immediate.  No pneumothorax on follow-up radiograph. PROCEDURE: An ultrasound guided thoracentesis was thoroughly discussed with the patient and questions answered. The benefits, risks, alternatives and complications were also discussed. The patient understands and wishes to proceed with the procedure. Written consent was obtained. Ultrasound was performed to localize and mark an adequate pocket of fluid in the left chest. The area was then prepped and draped in the normal sterile fashion. 1% Lidocaine was used for local anesthesia. Under ultrasound guidance a 6 Fr Safe-T-Centesis catheter was introduced. Thoracentesis was performed. The catheter was removed and a dressing applied. FINDINGS: A total of approximately 1.2 L of clear yellow fluid was removed. IMPRESSION: Successful ultrasound guided left thoracentesis yielding 1.2 L of pleural fluid. Electronically Signed   By: Lucrezia Europe M.D.   On: 03/28/2021 14:38    Cancer of lower lobe of left lung [facility]) #40 year old male patient with recently diagnosed adenocarcinoma the lung is currently admitted hospital for palpitations/SVT post port placement/paracentesis.  #Adenocarcinoma of the lung-stage IV.  I reviewed the PET scan at length with the patient and family-positive for bony metastatic disease/omental disease.    #Brain metastasis-MRI brain with and without contrast reviewed with the patient/family-shows multiple lesions in the brain largest of 11 mm.  Patient is currently asymptomatic.   #Left pleural effusion status postthoracentesis.   #SVT s/p adenosine; history of WPW syndrome  #Recommendations:  #Plan proceeding with [place] Alimta chemotherapy tomorrow as planned.  Patient and family understand treatments are palliative and not curative.  Await EGFR ALK/ROS1 mutation testing to decide on addition of Keytruda.  Patient is a non-smoker.   #With regards to brain metastases-given the asymptomatic disease lesions discussed  regarding holding initiation of radiation therapy given potential long-term side effects.  If patient is negative for above novel mutations-would recommend radiation.  However if patient is positive for any of the above mutation-potentially be treated with targeted  therapy which usually has CNS penetration.  #Recommend palliative care evaluation given anxiety.  Thank you Dr.Patel for allowing me to participate in the care of your pleasant patient. Please do not hesitate to contact me with questions or concerns in the interim.  Discussed with Dr. Posey Pronto; and [facility].   All questions were answered. The patient knows to call the clinic with any problems, questions or concerns.    [name], MD 03/29/2021 10:24 PM

## 2021-03-29 NOTE — Assessment & Plan Note (Addendum)
#  40 year old male patient with recently diagnosed adenocarcinoma the lung is currently admitted hospital for palpitations/SVT post port placement/paracentesis.  #Adenocarcinoma of the lung-stage IV-proceed with [place] Alimta chemotherapy cycle #1 today.  We will check to expedite mutation testing results.  #Brain metastasis-MRI brain with and without contrast reviewed with the patient/family-shows multiple lesions in the brain largest of 11 mm.  Patient is currently asymptomatic.  Continue dexamethasone 2 mg twice a day.  Hold off on brain radiation-while awaiting mutation testing.   #Left pleural effusion status postthoracentesis-we will repeat chest x-ray next week.  Order patient visit.  #SVT s/p adenosine; history of WPW syndrome-stable normal sinus rhythm.   #Patient will discharge from the hospital.  discussed with Dr. Posey Pronto; and [facility].

## 2021-03-29 NOTE — Progress Notes (Signed)
[facility] [place] NAME: [name]    MR#:  [id number]  DATE OF BIRTH:  June 05, 1981  SUBJECTIVE:  patient denies any complaints. He is very emotional from conversation with Dr. Rogue Bussing earlier about his prognosis from metastatic lung cancer. Sister in the room during my evaluation. Patient came in with elevated heart rate after he got his port placed yesterday. Currently heart rate during my evaluation was in the 90s. Started on oral beta-blockers. Denies any chest pain.   REVIEW OF SYSTEMS:   Review of Systems  Constitutional:  Negative for chills, fever and weight loss.  HENT:  Negative for ear discharge, ear pain and nosebleeds.   Eyes:  Negative for blurred vision, pain and discharge.  Respiratory:  Negative for sputum production, shortness of breath, wheezing and stridor.   Cardiovascular:  Negative for chest pain, palpitations, orthopnea and PND.  Gastrointestinal:  Negative for abdominal pain, diarrhea, nausea and vomiting.  Genitourinary:  Negative for frequency and urgency.  Musculoskeletal:  Negative for back pain and joint pain.  Neurological:  Negative for sensory change, speech change, focal weakness and weakness.  Psychiatric/Behavioral:  Negative for depression and hallucinations. The patient is nervous/anxious.    Tolerating Diet:yes Tolerating PT:   DRUG ALLERGIES:   Allergies  Allergen Reactions  . Codeine     Makes pt hyper    VITALS:  Blood pressure 124/83, pulse 84, temperature 98.8 F (37.1 C), temperature source Oral, resp. rate 18, height 5\' 10"  (1.778 m), weight 94.3 kg, SpO2 95 %.  PHYSICAL EXAMINATION:   Physical Exam  GENERAL:  40 y.o.-year-old patient lying in the bed with no acute distress.  HEENT: Head atraumatic, normocephalic. Oropharynx and nasopharynx clear.  LUNGS: Normal breath sounds bilaterally, no wheezing, rales, rhonchi. No use of accessory muscles of respiration.   CARDIOVASCULAR: S1, S2 normal. No murmurs, rubs, or gallops. Mild intermittent tachy ABDOMEN: Soft, nontender, nondistended. Bowel sounds present. No organomegaly or mass.  EXTREMITIES: No cyanosis, clubbing or edema b/l.    NEUROLOGIC: Cranial nerves II through XII are intact. No focal Motor or sensory deficits b/l.   PSYCHIATRIC:  patient is alert and oriented x 3.  SKIN: No obvious rash, lesion, or ulcer.   LABORATORY PANEL:  CBC Recent Labs  Lab 03/29/21 0632  WBC 16.9*  HGB 15.3  HCT 43.5  PLT 440*    Chemistries  Recent Labs  Lab 03/23/21 1233 03/29/21 0632  NA 133* 132*  K 4.2 4.3  CL 95* 97*  CO2 24 28  GLUCOSE 96 106*  BUN 17 13  CREATININE 0.82 0.76  CALCIUM 8.7* 8.2*  AST 50*  --   ALT 97*  --   ALKPHOS 399*  --   BILITOT 0.9  --    Cardiac Enzymes No results for input(s): TROPONINI in the last 168 hours. RADIOLOGY:  MR BRAIN W WO CONTRAST  Result Date: 03/28/2021 CLINICAL DATA:  Staging for lung mass EXAM: MRI HEAD WITHOUT AND WITH CONTRAST TECHNIQUE: Multiplanar, multiecho pulse sequences of the brain and surrounding structures were obtained without and with intravenous contrast. CONTRAST:  74mL GADAVIST GADOBUTROL 1 MMOL/ML IV SOLN COMPARISON:  None. FINDINGS: Brain: There are numerous contrast-enhancing lesions throughout the brain. The largest lesions are in the right frontal and parietal lobes. The largest right parietal lesion measures 11 mm with moderate surrounding edema. The largest right frontal lesion measures 10 mm with mild surrounding edema. There are greater than 20 lesions  in total. No midline shift or other mass effect. No acute infarct. No acute hemorrhage. Ventricular sizes are normal. Vascular: Normal flow voids. Skull and upper cervical spine: Normal marrow signal. Sinuses/Orbits: Negative. Other: None. IMPRESSION: Numerous scattered intraparenchymal metastases. The largest lesions are in the right frontal and parietal lobes with moderate  surrounding edema. No midline shift or other mass effect. Electronically Signed   By: Ulyses Jarred M.D.   On: 03/28/2021 22:27   PERIPHERAL VASCULAR CATHETERIZATION  Result Date: 03/29/2021 See surgical note for result.  NM PET Image Initial (PI) Skull Base To Thigh  Result Date: 03/28/2021 CLINICAL DATA:  Initial treatment strategy for non-small cell lung carcinoma. EXAM: NUCLEAR MEDICINE PET SKULL BASE TO THIGH TECHNIQUE: 10.7 mCi F-18 FDG was injected intravenously. Full-ring PET imaging was performed from the skull base to thigh after the radiotracer. CT data was obtained and used for attenuation correction and anatomic localization. Fasting blood glucose: 90 mg/dl COMPARISON:  03/15/2021 FINDINGS: Mediastinal blood-pool activity (background): SUV max = 2.6 Liver activity (reference): SUV max = N/A NECK: 6 mm left supraclavicular lymph node on image 50/3 is hypermetabolic, with SUV max of 3.6. Incidental CT findings:  None. CHEST: Left lung collapse is seen with a large hypermetabolic masslike opacity in the perihilar region and left lower lobe, which measures approximately 7.0 x 5.8 cm on image 101/3 and has SUV max of 17.5. Hypermetabolic activity in the collapsed left upper lobe is likely secondary to atelectasis. Large left pleural effusion causes mediastinal shift to the right and shows pleural base areas mild FDG uptake, highly suspicious for malignant pleural effusion. A 10 mm subcarinal lymph node shows FDG uptake with SUV max of 3.9. A 10 mm right paratracheal lymph node shows FDG uptake with SUV max of 3.9. Incidental CT findings:  None. ABDOMEN/PELVIS: No abnormal hypermetabolic activity within the liver, pancreas, adrenal glands, or spleen. 1.7 cm hypermetabolic soft tissue density in the splenic hilum has SUV max of 6.2, and may represent a hypermetabolic lymph node or peritoneal implant. Ill-defined soft tissue density in the left upper quadrant omental fat is hypermetabolic with SUV max  of 4.5, consistent with omental carcinomatosis. Hypermetabolic activity is seen involving the peritoneum in dependent pelvis, also suspicious for peritoneal carcinomatosis. No other hypermetabolic soft tissue masses or lymphadenopathy identified. Incidental CT findings:  None. SKELETON: Numerous hypermetabolic bone metastases are seen in the bilateral scapulae, bilateral ribs, thoracic and lumbosacral spine, and pelvis, most of which are lytic in appearance. Incidental CT findings:  None. IMPRESSION: Left lung collapse with 7 cm hypermetabolic masslike opacity centered in the left lower lobe and involving the left hilum, consistent with primary bronchogenic carcinoma. Large malignant left pleural effusion. Hypermetabolic lymphadenopathy in mediastinum and left supraclavicular region. Peritoneal/omental carcinomatosis. Diffuse bone metastases. Electronically Signed   By: Marlaine Hind M.D.   On: 03/28/2021 09:59   DG Chest Port 1 View  Result Date: 03/28/2021 CLINICAL DATA:  Pleural effusion left, s/p thora EXAM: PORTABLE CHEST - 1 VIEW COMPARISON:  03/27/2021 PET-CT FINDINGS: No pneumothorax. Near complete opacification of left hemithorax with central air bronchograms. Heart size difficult to assess due to adjacent opacities. On the right, diffuse pulmonary vascular congestion. Lytic lesions in bilateral ribs are identified, with probable pathologic fracture involving the right sixth rib. IMPRESSION: 1. No pneumothorax post left thoracentesis, with persistent opacification of the left hemithorax. Electronically Signed   By: Lucrezia Europe M.D.   On: 03/28/2021 14:11   US THORACENTESIS ASP PLEURAL SPACE W/IMG  GUIDE  Result Date: 03/28/2021 INDICATION: Non-small cell lung carcinoma, metastatic.  Left pleural effusion. EXAM: ULTRASOUND GUIDED LEFT THORACENTESIS MEDICATIONS: Lidocaine 1% subcutaneous COMPLICATIONS: None immediate.  No pneumothorax on follow-up radiograph. PROCEDURE: An ultrasound guided thoracentesis  was thoroughly discussed with the patient and questions answered. The benefits, risks, alternatives and complications were also discussed. The patient understands and wishes to proceed with the procedure. Written consent was obtained. Ultrasound was performed to localize and mark an adequate pocket of fluid in the left chest. The area was then prepped and draped in the normal sterile fashion. 1% Lidocaine was used for local anesthesia. Under ultrasound guidance a 6 Fr Safe-T-Centesis catheter was introduced. Thoracentesis was performed. The catheter was removed and a dressing applied. FINDINGS: A total of approximately 1.2 L of clear yellow fluid was removed. IMPRESSION: Successful ultrasound guided left thoracentesis yielding 1.2 L of pleural fluid. Electronically Signed   By: Lucrezia Europe M.D.   On: 03/28/2021 14:38   ASSESSMENT AND PLAN:  GLENDELL FOUSE is a 40 y.o. 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  Supraventricular tachycardia history of W PW syndrome --Patient had an episode of SVT while getting a Mediport inserted for his chemotherapy.Marland Kitchen --He has a known history of WPW syndrome --Patient received IV Cardizem and 6 mg of adenosine and converted to sinus tachycardia --He is stable and has been seen by cardiology Dr Ubaldo Glassing-- start metoprolol 12.5 BID. -- heart rate intermittently goes in the upper 90s    Non-small cell cancer of left lung Stage IV disease malignant pleural effusion status post thoracentesis --Status post thoracentesis with drainage of 1.2 L of pleural fluid most likely malignant pleural effusion --Status post Mediport insertion for chemotherapy-- patient to get first round of chemotherapy tomorrow per Dr. Rogue Bussing. -- patient overall  has poor prognosis. [name] from palliative care to see patient   Hyponatremia Most likely related to patient's known non-small cell lung cancer  Constipation -- continue bowel supplements  Procedures: may report placement, thoracentesis Family communication : sister at bedside Consults : cardiology, oncology, palliative care CODE STATUS: full DVT Prophylaxis : ambulation. lovenox Level of care: Progressive Cardiac Status is: Observation  The patient remains OBS appropriate and will d/c before 2 midnights.  Dispo: The patient is from: Home              Anticipated d/c is to: Home              Patient currently is not medically stable to d/c.   Difficult to place patient No   patient to be monitored overnight and received first dose of chemotherapy tomorrow.     TOTAL TIME TAKING CARE OF THIS PATIENT: 35 minutes.  >50% time spent on counselling and coordination of care  Note: This dictation was prepared with Dragon dictation along with smaller phrase technology. Any transcriptional errors that result from this process are unintentional.  Fritzi Mandes M.D    [facility]   CC: Primary care physician; Pcp, No Patient ID: [name], male   DOB: 07-18-1981, 40 y.o.   MRN: [id number]

## 2021-03-29 NOTE — Consult Note (Signed)
[place]  [phone] [phone]   Name: [name] Date: 03/29/2021 MRN: [id number]  DOB: 1980-12-02  Patient Care Team: Pcp, No as PCP - Pierre Bali, RN as Oncology Nurse Navigator    REASON FOR CONSULTATION: [name] is a 40 y.o. 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  Patient has had severe anxiety and depression following his diagnosis with cancer.  Palliative care was consulted up address goals and manage ongoing symptoms.  SOCIAL HISTORY:     reports that he has never smoked. His smokeless tobacco use includes snuff and chew. He reports that he does not drink alcohol and does not use drugs.  Patient is married and lives at home with his wife and 65 year old daughter.  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  ADVANCE DIRECTIVES:  Does not have  CODE STATUS: Full code  PAST MEDICAL HISTORY: Past Medical History:  Diagnosis Date   Cancer ([place])    Dyspnea    Heartburn    Pneumonia    Wolff-Parkinson-White (WPW) 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    PAST SURGICAL HISTORY:  Past Surgical History:  Procedure Laterality Date   FRACTURE SURGERY     broke femur when he was 6 years   PORTA CATH INSERTION N/A 03/28/2021   Procedure: PORTA CATH INSERTION;  Surgeon: Katha Cabal, MD;  Location: [facility];  Service: Cardiovascular;  Laterality: N/A;   VIDEO BRONCHOSCOPY WITH ENDOBRONCHIAL NAVIGATION N/A 03/15/2021   Procedure: VIDEO BRONCHOSCOPY WITH ENDOBRONCHIAL NAVIGATION;  Surgeon: Ottie Glazier, MD;  Location: [facility];  Service: Thoracic;  Laterality: N/A;   VIDEO BRONCHOSCOPY WITH ENDOBRONCHIAL ULTRASOUND N/A 03/15/2021   Procedure: VIDEO BRONCHOSCOPY WITH ENDOBRONCHIAL ULTRASOUND;  Surgeon: Ottie Glazier, MD;   Location: [facility];  Service: Thoracic;  Laterality: N/A;    HEMATOLOGY/ONCOLOGY HISTORY:  Oncology History Overview Note  IMPRESSION: 1. Patchy nodular fat stranding throughout the anterior left upper quadrant peritoneal fat, nonspecific, cannot exclude peritoneal carcinomatosis. Dedicated CT abdomen/pelvis with oral and IV contrast recommended for further evaluation. 2. Dense patchy consolidation replacing much of the left lower lung lobe, appearing masslike in the superior segment left lower lobe, with associated bulging of the left major fissure and associated left lower lobe volume loss. Fine nodularity throughout both lungs with an upper lobe predominance. Asymmetric left upper lobe interlobular septal thickening. These findings are indeterminate, with differential including multilobar pneumonia, sarcoidosis or a neoplastic process. The persistence on radiographs back to 01/20/2021 despite antibiotic therapy make sarcoidosis or a neoplastic process more likely. Pulmonology consultation suggested for consideration of bronchoscopic evaluation. 3. Small dependent left pleural effusion. 4. Mild mediastinal lymphadenopathy, nonspecific. 5. Subacute healing lateral right sixth rib fracture.   DIAGNOSIS:  A. LUNG, LEFT LOWER LOBE; ENB-ASSISTED BIOPSY:  - NON-SMALL CELL CARCINOMA, FAVOR ADENOCARCINOMA.  - FOREIGN MATERIAL SUGGESTIVE OF ASPIRATION.   There is sufficient material for limited ancillary studi   Cancer of lower lobe of left lung ([place])  03/23/2021 Initial Diagnosis   Cancer of lower lobe of left lung ([place])   03/27/2021 -  Chemotherapy    Patient is on Treatment Plan: LUNG NSCLC PEMETREXED (ALIMTA) / CARBOPLATIN Q21D X 1 CYCLES         ALLERGIES:  is allergic to codeine.  MEDICATIONS:  Current Facility-Administered Medications  Medication Dose Route Frequency Provider Last Rate Last Admin   0.9 %  sodium chloride infusion  250 mL Intravenous PRN Agbata, Tochukwu,  MD       amoxicillin-clavulanate (AUGMENTIN) 500-125 MG per tablet 500 mg  1 tablet Oral BID Agbata, Tochukwu, MD   500 mg at 03/29/21 1015   bisacodyl (DULCOLAX) EC tablet 10 mg  10 mg Oral Daily Fritzi Mandes, MD   10 mg at 03/29/21 1404   Chlorhexidine Gluconate Cloth 2 % PADS 6 each  6 each Topical Daily Agbata, Tochukwu, MD       chlorpheniramine-HYDROcodone (TUSSIONEX) 10-8 MG/5ML suspension 5 mL  5 mL Oral Q12H PRN Agbata, Tochukwu, MD   5 mL at 03/28/21 2101   dexamethasone (DECADRON) tablet 4 mg  4 mg Oral Q12H [name], MD       docusate sodium (COLACE) capsule 100 mg  100 mg Oral BID Fritzi Mandes, MD   100 mg at 51/02/58 5277   folic acid (FOLVITE) tablet 1 mg  1 mg Oral Daily Agbata, Tochukwu, MD   1 mg at 03/29/21 1017   HYDROcodone-acetaminophen (NORCO/VICODIN) 5-325 MG per tablet 1 tablet  1 tablet Oral Q6H PRN Agbata, Tochukwu, MD       HYDROmorphone (DILAUDID) injection 1 mg  1 mg Intravenous Once PRN Kris Hartmann, NP       [START ON 03/30/2021] lidocaine-prilocaine (EMLA) cream   Topical Once [name], MD       LORazepam (ATIVAN) tablet 0.5 mg  0.5 mg Oral Q6H PRN Haron Beilke, Kirt Boys, NP       metoprolol tartrate (LOPRESSOR) tablet 12.5 mg  12.5 mg Oral BID Fritzi Mandes, MD   12.5 mg at 03/29/21 1016   ondansetron (ZOFRAN) tablet 4 mg  4 mg Oral Q6H PRN Agbata, Tochukwu, MD       Or   ondansetron (ZOFRAN) injection 4 mg  4 mg Intravenous Q6H PRN Agbata, Tochukwu, MD       sertraline (ZOLOFT) tablet 25 mg  25 mg Oral Daily Zephyr Sausedo, Kirt Boys, NP       sodium chloride flush (NS) 0.9 % injection 3 mL  3 mL Intravenous Q12H Agbata, Tochukwu, MD   3 mL at 03/29/21 1018   sodium chloride flush (NS) 0.9 % injection 3 mL  3 mL Intravenous PRN Agbata, Tochukwu, MD        VITAL SIGNS: BP 127/79 (BP Location: Right Arm)   Pulse 90   Temp 98 F (36.7 C) (Oral)   Resp 18   Ht '5\' 10"'  (1.778 m)   Wt 208 lb (94.3 kg)   SpO2 95%   BMI 29.84 kg/m  Filed Weights    03/28/21 1422  Weight: 208 lb (94.3 kg)    Estimated body mass index is 29.84 kg/m as calculated from the following:   Height as of this encounter: '5\' 10"'  (1.778 m).   Weight as of this encounter: 208 lb (94.3 kg).  LABS: CBC:    Component Value Date/Time   WBC 16.9 (H) 03/29/2021 0632   HGB 15.3 03/29/2021 0632   HCT 43.5 03/29/2021 0632   PLT 440 (H) 03/29/2021 0632   MCV 91.2 03/29/2021 0632   Comprehensive Metabolic Panel:    Component Value Date/Time   NA 132 (L) 03/29/2021 0632   K 4.3 03/29/2021 0632   CL 97 (L) 03/29/2021 0632   CO2 28 03/29/2021 0632   BUN 13 03/29/2021 0632   CREATININE 0.76  03/29/2021 0632   GLUCOSE 106 (H) 03/29/2021 0632   CALCIUM 8.2 (L) 03/29/2021 0632   AST 50 (H) 03/23/2021 1233   ALT 97 (H) 03/23/2021 1233   ALKPHOS 399 (H) 03/23/2021 1233   BILITOT 0.9 03/23/2021 1233   PROT 7.1 03/23/2021 1233   ALBUMIN 3.2 (L) 03/23/2021 1233    RADIOGRAPHIC STUDIES: MR BRAIN W WO CONTRAST  Result Date: 03/28/2021 CLINICAL DATA:  Staging for lung mass EXAM: MRI HEAD WITHOUT AND WITH CONTRAST TECHNIQUE: Multiplanar, multiecho pulse sequences of the brain and surrounding structures were obtained without and with intravenous contrast. CONTRAST:  51m GADAVIST GADOBUTROL 1 MMOL/ML IV SOLN COMPARISON:  None. FINDINGS: Brain: There are numerous contrast-enhancing lesions throughout the brain. The largest lesions are in the right frontal and parietal lobes. The largest right parietal lesion measures 11 mm with moderate surrounding edema. The largest right frontal lesion measures 10 mm with mild surrounding edema. There are greater than 20 lesions in total. No midline shift or other mass effect. No acute infarct. No acute hemorrhage. Ventricular sizes are normal. Vascular: Normal flow voids. Skull and upper cervical spine: Normal marrow signal. Sinuses/Orbits: Negative. Other: None. IMPRESSION: Numerous scattered intraparenchymal metastases. The largest lesions are  in the right frontal and parietal lobes with moderate surrounding edema. No midline shift or other mass effect. Electronically Signed   By: KUlyses JarredM.D.   On: 03/28/2021 22:27   PERIPHERAL VASCULAR CATHETERIZATION  Result Date: 03/29/2021 See surgical note for result.  NM PET Image Initial (PI) Skull Base To Thigh  Result Date: 03/28/2021 CLINICAL DATA:  Initial treatment strategy for non-small cell lung carcinoma. EXAM: NUCLEAR MEDICINE PET SKULL BASE TO THIGH TECHNIQUE: 10.7 mCi F-18 FDG was injected intravenously. Full-ring PET imaging was performed from the skull base to thigh after the radiotracer. CT data was obtained and used for attenuation correction and anatomic localization. Fasting blood glucose: 90 mg/dl COMPARISON:  03/15/2021 FINDINGS: Mediastinal blood-pool activity (background): SUV max = 2.6 Liver activity (reference): SUV max = N/A NECK: 6 mm left supraclavicular lymph node on image 544/0is hypermetabolic, with SUV max of 3.6. Incidental CT findings:  None. CHEST: Left lung collapse is seen with a large hypermetabolic masslike opacity in the perihilar region and left lower lobe, which measures approximately 7.0 x 5.8 cm on image 101/3 and has SUV max of 17.5. Hypermetabolic activity in the collapsed left upper lobe is likely secondary to atelectasis. Large left pleural effusion causes mediastinal shift to the right and shows pleural base areas mild FDG uptake, highly suspicious for malignant pleural effusion. A 10 mm subcarinal lymph node shows FDG uptake with SUV max of 3.9. A 10 mm right paratracheal lymph node shows FDG uptake with SUV max of 3.9. Incidental CT findings:  None. ABDOMEN/PELVIS: No abnormal hypermetabolic activity within the liver, pancreas, adrenal glands, or spleen. 1.7 cm hypermetabolic soft tissue density in the splenic hilum has SUV max of 6.2, and may represent a hypermetabolic lymph node or peritoneal implant. Ill-defined soft tissue density in the left upper  quadrant omental fat is hypermetabolic with SUV max of 4.5, consistent with omental carcinomatosis. Hypermetabolic activity is seen involving the peritoneum in dependent pelvis, also suspicious for peritoneal carcinomatosis. No other hypermetabolic soft tissue masses or lymphadenopathy identified. Incidental CT findings:  None. SKELETON: Numerous hypermetabolic bone metastases are seen in the bilateral scapulae, bilateral ribs, thoracic and lumbosacral spine, and pelvis, most of which are lytic in appearance. Incidental CT findings:  None. IMPRESSION: Left lung  collapse with 7 cm hypermetabolic masslike opacity centered in the left lower lobe and involving the left hilum, consistent with primary bronchogenic carcinoma. Large malignant left pleural effusion. Hypermetabolic lymphadenopathy in mediastinum and left supraclavicular region. Peritoneal/omental carcinomatosis. Diffuse bone metastases. Electronically Signed   By: Marlaine Hind M.D.   On: 03/28/2021 09:59   DG Chest Port 1 View  Result Date: 03/28/2021 CLINICAL DATA:  Pleural effusion left, s/p thora EXAM: PORTABLE CHEST - 1 VIEW COMPARISON:  03/27/2021 PET-CT FINDINGS: No pneumothorax. Near complete opacification of left hemithorax with central air bronchograms. Heart size difficult to assess due to adjacent opacities. On the right, diffuse pulmonary vascular congestion. Lytic lesions in bilateral ribs are identified, with probable pathologic fracture involving the right sixth rib. IMPRESSION: 1. No pneumothorax post left thoracentesis, with persistent opacification of the left hemithorax. Electronically Signed   By: Lucrezia Europe M.D.   On: 03/28/2021 14:11   DG Chest Port 1 View  Result Date: 03/15/2021 CLINICAL DATA:  Post bronchoscopy on the left EXAM: PORTABLE CHEST 1 VIEW COMPARISON:  CT chest 03/15/2021 FINDINGS: Mild to moderate left pleural effusion. Left lower lobe consolidation. No pneumothorax. Mild right lower lobe airspace disease appears  new from the CT today. There is mild vascular congestion which may be due to mild fluid overload IMPRESSION: No pneumothorax post bronchoscopy Persistent left pleural effusion and left lower lobe consolidation/mass Progression of right lower lobe airspace disease Pulmonary vascular  congestion.  Question fluid overload Electronically Signed   By: Franchot Gallo M.D.   On: 03/15/2021 15:55   DG C-Arm 1-60 Min-No Report  Result Date: 03/15/2021 Fluoroscopy was utilized by the requesting physician.  No radiographic interpretation.   CT Super D Chest Wo Contrast  Result Date: 03/15/2021 CLINICAL DATA:  Recent pneumonia.  Cough. EXAM: CT CHEST WITHOUT CONTRAST TECHNIQUE: Multidetector CT imaging of the chest was performed using thin slice collimation for electromagnetic bronchoscopy planning purposes, without intravenous contrast. COMPARISON:  02/23/2021 FINDINGS: Cardiovascular: The heart size is normal. No substantial pericardial effusion. No thoracic aortic aneurysm. Mediastinum/Nodes: 10 mm short axis right paratracheal node evident. 10 mm short axis subcarinal lymph node associated. No definite right hilar lymphadenopathy on this noncontrast study. Soft tissue fullness noted in the left hilum although assessment limited by lack of intravenous contrast material. The esophagus has normal imaging features. There is no axillary lymphadenopathy. Lungs/Pleura: Diffuse micro nodularity again noted bilaterally with an upper lung predominance. The collapse/consolidative change in the left lower lobe is similar to prior with 6.1 x 4.0 cm measured masslike consolidative opacity stable at 6.1 x 4.5 cm today. Small left pleural effusion evident. Upper Abdomen: Nodular soft tissue attenuation in the left upper quadrant/omental region is again noted. Musculoskeletal: 11 mm lucency is identified in the T10 vertebral body (43/2) IMPRESSION: 1. No substantial interval change in the dense consolidative opacity involving the left  lower lobe with associated small left pleural effusion. 2. Bilateral relatively symmetric micro nodularity demonstrates an upper lobe predominance, unchanged. This may be infectious/inflammatory in etiology. 3. Similar appearance of the ill-defined nodular soft tissue involving the anterior left upper quadrant, likely omental. Given that metastatic disease could certainly have this appearance, dedicated abdomen/pelvis CT with oral and intravenous contrast recommended to further evaluate. Electronically Signed   By: Misty Stanley M.D.   On: 03/15/2021 12:48   US THORACENTESIS ASP PLEURAL SPACE W/IMG GUIDE  Result Date: 03/28/2021 INDICATION: Non-small cell lung carcinoma, metastatic.  Left pleural effusion. EXAM: ULTRASOUND GUIDED LEFT THORACENTESIS  MEDICATIONS: Lidocaine 1% subcutaneous COMPLICATIONS: None immediate.  No pneumothorax on follow-up radiograph. PROCEDURE: An ultrasound guided thoracentesis was thoroughly discussed with the patient and questions answered. The benefits, risks, alternatives and complications were also discussed. The patient understands and wishes to proceed with the procedure. Written consent was obtained. Ultrasound was performed to localize and mark an adequate pocket of fluid in the left chest. The area was then prepped and draped in the normal sterile fashion. 1% Lidocaine was used for local anesthesia. Under ultrasound guidance a 6 Fr Safe-T-Centesis catheter was introduced. Thoracentesis was performed. The catheter was removed and a dressing applied. FINDINGS: A total of approximately 1.2 L of clear yellow fluid was removed. IMPRESSION: Successful ultrasound guided left thoracentesis yielding 1.2 L of pleural fluid. Electronically Signed   By: Lucrezia Europe M.D.   On: 03/28/2021 14:38    PERFORMANCE STATUS (ECOG) : 1 - Symptomatic but completely ambulatory  Review of Systems Unless otherwise noted, a complete review of systems is negative.  Physical Exam General:  NAD Cardiovascular: regular rate and rhythm Pulmonary: Unlabored Extremities: no edema, no joint deformities Skin: no rashes Neurological: Grossly nonfocal Psych: Tearful, anxious appearing  IMPRESSION: I met with patient and wife.  Introduced palliative care services and attempted to establish therapeutic rapport.  Patient was understandably quite emotionally upset given his cancer diagnosis and plan for chemotherapy tomorrow.  He was tearful and endorsed anxiety and depressive symptoms.  Patient says that he had been offered anxiety and depression medications earlier today but has refused.  He says that he is now willing to start medications with hope that they will improve his moods.  Given WPW, will avoid SSRIs known to cause QT prolongation.  We will also avoid SSRIs more prone to cause sexual dysfunction.  We will start patient on low-dose benzodiazepine to manage anxiety acutely until hopefully mood stabilized on SSRI.  Patient would likely benefit from grief/therapeutic counseling but we can discuss this in the outpatient setting and coordinate referral if he is in agreement.  Similarly, I would recommend [facility] referral if patient/family are interested.   Patient's goals are clearly aligned with the current scope of treatment.  He is committed to starting chemotherapy tomorrow.  PLAN: -Continue current scope of treatment -Start sertraline 25 mg daily -Start lorazepam 0.5 mg p.o. every 6 hours as needed for anxiety/sleep -Please allow wife to stay the night -Consider psych/counseling referral if patient is interested -Would recommend [facility] -Will plan to follow patient in the clinic  Case and plan discussed with Drs. Posey [place] and [place]. Also discussed with nursing team.  Time Total: 60 minutes  Visit consisted of counseling and education dealing with the complex and emotionally intense issues of symptom management and palliative care in the setting of serious and  potentially life-threatening illness.Greater than 50%  of this time was spent counseling and coordinating care related to the above assessment and plan.  Signed by: Altha Harm, PhD, NP-C

## 2021-03-29 NOTE — Progress Notes (Signed)
Patient Name: [name] Date of Encounter: 03/29/2021  Hospital Problem List     Principal Problem:   SVT (supraventricular tachycardia) ([place]) Active Problems:   Non-small cell cancer of left lung (HCC)   WPW (Wolff-Parkinson-White syndrome)   Hyponatremia   Transaminitis    Patient Profile      40 y.o. male with history of Wolff-Parkinson-White syndrome, lung carcinoma who developed narrow complex tachycardia post port placement and thoracentesis today.  Rate was around 190.  It appears the delta wave was not present during the tachycardia.  He received IV Cardizem with no improvement.  6 mg of IV adenocard broke the rhythm back to sinus rhythm.  He currently is in sinus rhythm.  Subjective   Very emotionally upset due to being recommended to start chemotherapy No cardiac complaints  Inpatient Medications     amoxicillin-clavulanate  1 tablet Oral BID   bisacodyl  10 mg Oral Daily   Chlorhexidine Gluconate Cloth  6 each Topical Daily   docusate sodium  100 mg Oral BID   folic acid  1 mg Oral Daily   metoprolol tartrate  12.5 mg Oral BID   sodium chloride flush  3 mL Intravenous Q12H    Vital Signs    Vitals:   03/28/21 1753 03/29/21 0814 03/29/21 0826 03/29/21 1153  BP:    124/83  Pulse: (!) 107   84  Resp:  18 18 18   Temp:  98.9 F (37.2 C) 98.9 F (37.2 C) 98.8 F (37.1 C)  TempSrc:  Oral  Oral  SpO2:    95%  Weight:      Height:        Intake/Output Summary (Last 24 hours) at 03/29/2021 1246 Last data filed at 03/29/2021 0700 Gross per 24 hour  Intake 720 ml  Output --  Net 720 ml   Filed Weights   03/28/21 1422  Weight: 94.3 kg    Physical Exam    GEN: Well nourished, well developed, in no acute distress.  HEENT: normal.  Neck: Supple, no JVD, carotid bruits, or masses. Cardiac: RRR, no murmurs, rubs, or gallops. No clubbing, cyanosis, edema.  Radials/DP/PT 2+ and equal bilaterally.  Respiratory:  Respirations regular and unlabored,  clear to auscultation bilaterally. GI: Soft, nontender, nondistended, BS + x 4. MS: no deformity or atrophy. Skin: warm and dry, no rash. Neuro:  Strength and sensation are intact. Psych: Emotionally upset.   Labs    CBC Recent Labs    03/29/21 0632  WBC 16.9*  HGB 15.3  HCT 43.5  MCV 91.2  PLT 176*   Basic Metabolic Panel Recent Labs    03/29/21 0632  NA 132*  K 4.3  CL 97*  CO2 28  GLUCOSE 106*  BUN 13  CREATININE 0.76  CALCIUM 8.2*   Liver Function Tests No results for input(s): AST, ALT, ALKPHOS, BILITOT, PROT, ALBUMIN in the last 72 hours. No results for input(s): LIPASE, AMYLASE in the last 72 hours. Cardiac Enzymes No results for input(s): CKTOTAL, CKMB, CKMBINDEX, TROPONINI in the last 72 hours. BNP No results for input(s): BNP in the last 72 hours. D-Dimer No results for input(s): DDIMER in the last 72 hours. Hemoglobin A1C No results for input(s): HGBA1C in the last 72 hours. Fasting Lipid Panel No results for input(s): CHOL, HDL, LDLCALC, TRIG, CHOLHDL, LDLDIRECT in the last 72 hours. Thyroid Function Tests Recent Labs    03/28/21 1802  TSH 1.198    Telemetry  nsr  ECG    nsr  Radiology    MR BRAIN W WO CONTRAST  Result Date: 03/28/2021 CLINICAL DATA:  Staging for lung mass EXAM: MRI HEAD WITHOUT AND WITH CONTRAST TECHNIQUE: Multiplanar, multiecho pulse sequences of the brain and surrounding structures were obtained without and with intravenous contrast. CONTRAST:  5mL GADAVIST GADOBUTROL 1 MMOL/ML IV SOLN COMPARISON:  None. FINDINGS: Brain: There are numerous contrast-enhancing lesions throughout the brain. The largest lesions are in the right frontal and parietal lobes. The largest right parietal lesion measures 11 mm with moderate surrounding edema. The largest right frontal lesion measures 10 mm with mild surrounding edema. There are greater than 20 lesions in total. No midline shift or other mass effect. No acute infarct. No acute  hemorrhage. Ventricular sizes are normal. Vascular: Normal flow voids. Skull and upper cervical spine: Normal marrow signal. Sinuses/Orbits: Negative. Other: None. IMPRESSION: Numerous scattered intraparenchymal metastases. The largest lesions are in the right frontal and parietal lobes with moderate surrounding edema. No midline shift or other mass effect. Electronically Signed   By: Ulyses Jarred M.D.   On: 03/28/2021 22:27   PERIPHERAL VASCULAR CATHETERIZATION  Result Date: 03/29/2021 See surgical note for result.  NM PET Image Initial (PI) Skull Base To Thigh  Result Date: 03/28/2021 CLINICAL DATA:  Initial treatment strategy for non-small cell lung carcinoma. EXAM: NUCLEAR MEDICINE PET SKULL BASE TO THIGH TECHNIQUE: 10.7 mCi F-18 FDG was injected intravenously. Full-ring PET imaging was performed from the skull base to thigh after the radiotracer. CT data was obtained and used for attenuation correction and anatomic localization. Fasting blood glucose: 90 mg/dl COMPARISON:  03/15/2021 FINDINGS: Mediastinal blood-pool activity (background): SUV max = 2.6 Liver activity (reference): SUV max = N/A NECK: 6 mm left supraclavicular lymph node on image 66/0 is hypermetabolic, with SUV max of 3.6. Incidental CT findings:  None. CHEST: Left lung collapse is seen with a large hypermetabolic masslike opacity in the perihilar region and left lower lobe, which measures approximately 7.0 x 5.8 cm on image 101/3 and has SUV max of 17.5. Hypermetabolic activity in the collapsed left upper lobe is likely secondary to atelectasis. Large left pleural effusion causes mediastinal shift to the right and shows pleural base areas mild FDG uptake, highly suspicious for malignant pleural effusion. A 10 mm subcarinal lymph node shows FDG uptake with SUV max of 3.9. A 10 mm right paratracheal lymph node shows FDG uptake with SUV max of 3.9. Incidental CT findings:  None. ABDOMEN/PELVIS: No abnormal hypermetabolic activity within  the liver, pancreas, adrenal glands, or spleen. 1.7 cm hypermetabolic soft tissue density in the splenic hilum has SUV max of 6.2, and may represent a hypermetabolic lymph node or peritoneal implant. Ill-defined soft tissue density in the left upper quadrant omental fat is hypermetabolic with SUV max of 4.5, consistent with omental carcinomatosis. Hypermetabolic activity is seen involving the peritoneum in dependent pelvis, also suspicious for peritoneal carcinomatosis. No other hypermetabolic soft tissue masses or lymphadenopathy identified. Incidental CT findings:  None. SKELETON: Numerous hypermetabolic bone metastases are seen in the bilateral scapulae, bilateral ribs, thoracic and lumbosacral spine, and pelvis, most of which are lytic in appearance. Incidental CT findings:  None. IMPRESSION: Left lung collapse with 7 cm hypermetabolic masslike opacity centered in the left lower lobe and involving the left hilum, consistent with primary bronchogenic carcinoma. Large malignant left pleural effusion. Hypermetabolic lymphadenopathy in mediastinum and left supraclavicular region. Peritoneal/omental carcinomatosis. Diffuse bone metastases. Electronically Signed   By: Marlaine Hind  M.D.   On: 03/28/2021 09:59   DG Chest Port 1 View  Result Date: 03/28/2021 CLINICAL DATA:  Pleural effusion left, s/p thora EXAM: PORTABLE CHEST - 1 VIEW COMPARISON:  03/27/2021 PET-CT FINDINGS: No pneumothorax. Near complete opacification of left hemithorax with central air bronchograms. Heart size difficult to assess due to adjacent opacities. On the right, diffuse pulmonary vascular congestion. Lytic lesions in bilateral ribs are identified, with probable pathologic fracture involving the right sixth rib. IMPRESSION: 1. No pneumothorax post left thoracentesis, with persistent opacification of the left hemithorax. Electronically Signed   By: Lucrezia Europe M.D.   On: 03/28/2021 14:11   DG Chest Port 1 View  Result Date:  03/15/2021 CLINICAL DATA:  Post bronchoscopy on the left EXAM: PORTABLE CHEST 1 VIEW COMPARISON:  CT chest 03/15/2021 FINDINGS: Mild to moderate left pleural effusion. Left lower lobe consolidation. No pneumothorax. Mild right lower lobe airspace disease appears new from the CT today. There is mild vascular congestion which may be due to mild fluid overload IMPRESSION: No pneumothorax post bronchoscopy Persistent left pleural effusion and left lower lobe consolidation/mass Progression of right lower lobe airspace disease Pulmonary vascular  congestion.  Question fluid overload Electronically Signed   By: Franchot Gallo M.D.   On: 03/15/2021 15:55   DG C-Arm 1-60 Min-No Report  Result Date: 03/15/2021 Fluoroscopy was utilized by the requesting physician.  No radiographic interpretation.   CT Super D Chest Wo Contrast  Result Date: 03/15/2021 CLINICAL DATA:  Recent pneumonia.  Cough. EXAM: CT CHEST WITHOUT CONTRAST TECHNIQUE: Multidetector CT imaging of the chest was performed using thin slice collimation for electromagnetic bronchoscopy planning purposes, without intravenous contrast. COMPARISON:  02/23/2021 FINDINGS: Cardiovascular: The heart size is normal. No substantial pericardial effusion. No thoracic aortic aneurysm. Mediastinum/Nodes: 10 mm short axis right paratracheal node evident. 10 mm short axis subcarinal lymph node associated. No definite right hilar lymphadenopathy on this noncontrast study. Soft tissue fullness noted in the left hilum although assessment limited by lack of intravenous contrast material. The esophagus has normal imaging features. There is no axillary lymphadenopathy. Lungs/Pleura: Diffuse micro nodularity again noted bilaterally with an upper lung predominance. The collapse/consolidative change in the left lower lobe is similar to prior with 6.1 x 4.0 cm measured masslike consolidative opacity stable at 6.1 x 4.5 cm today. Small left pleural effusion evident. Upper Abdomen:  Nodular soft tissue attenuation in the left upper quadrant/omental region is again noted. Musculoskeletal: 11 mm lucency is identified in the T10 vertebral body (43/2) IMPRESSION: 1. No substantial interval change in the dense consolidative opacity involving the left lower lobe with associated small left pleural effusion. 2. Bilateral relatively symmetric micro nodularity demonstrates an upper lobe predominance, unchanged. This may be infectious/inflammatory in etiology. 3. Similar appearance of the ill-defined nodular soft tissue involving the anterior left upper quadrant, likely omental. Given that metastatic disease could certainly have this appearance, dedicated abdomen/pelvis CT with oral and intravenous contrast recommended to further evaluate. Electronically Signed   By: Misty Stanley M.D.   On: 03/15/2021 12:48   US THORACENTESIS ASP PLEURAL SPACE W/IMG GUIDE  Result Date: 03/28/2021 INDICATION: Non-small cell lung carcinoma, metastatic.  Left pleural effusion. EXAM: ULTRASOUND GUIDED LEFT THORACENTESIS MEDICATIONS: Lidocaine 1% subcutaneous COMPLICATIONS: None immediate.  No pneumothorax on follow-up radiograph. PROCEDURE: An ultrasound guided thoracentesis was thoroughly discussed with the patient and questions answered. The benefits, risks, alternatives and complications were also discussed. The patient understands and wishes to proceed with the procedure. Written consent was  obtained. Ultrasound was performed to localize and mark an adequate pocket of fluid in the left chest. The area was then prepped and draped in the normal sterile fashion. 1% Lidocaine was used for local anesthesia. Under ultrasound guidance a 6 Fr Safe-T-Centesis catheter was introduced. Thoracentesis was performed. The catheter was removed and a dressing applied. FINDINGS: A total of approximately 1.2 L of clear yellow fluid was removed. IMPRESSION: Successful ultrasound guided left thoracentesis yielding 1.2 L of pleural fluid.  Electronically Signed   By: Lucrezia Europe M.D.   On: 03/28/2021 14:38    Assessment & Plan    Had episode of SVT yesterday broke with adenosine.  Currently remains in sinus rhythm.  Has a history of WPW.  Pending course of his chemotherapy will need consideration for ablation at some time although has very rare breakthroughs.  Would recommend low-dose beta-blocker at 25 mg of metoprolol succinate daily.  Is being sent for starting chemotherapy today.  No further cardiac work-up indicated at present.  Signed, Javier Docker Abdurahman Rugg MD 03/29/2021, 12:46 PM  Pager: [phone]

## 2021-03-30 ENCOUNTER — Ambulatory Visit: Payer: Managed Care, Other (non HMO)

## 2021-03-30 ENCOUNTER — Other Ambulatory Visit: Payer: Self-pay | Admitting: Internal Medicine

## 2021-03-30 ENCOUNTER — Other Ambulatory Visit: Payer: Self-pay | Admitting: *Deleted

## 2021-03-30 ENCOUNTER — Inpatient Hospital Stay (HOSPITAL_BASED_OUTPATIENT_CLINIC_OR_DEPARTMENT_OTHER): Payer: Managed Care, Other (non HMO) | Admitting: Hospice and Palliative Medicine

## 2021-03-30 ENCOUNTER — Inpatient Hospital Stay: Payer: Managed Care, Other (non HMO)

## 2021-03-30 ENCOUNTER — Inpatient Hospital Stay: Payer: Managed Care, Other (non HMO) | Admitting: Internal Medicine

## 2021-03-30 ENCOUNTER — Encounter: Payer: Self-pay | Admitting: *Deleted

## 2021-03-30 VITALS — BP 127/80 | HR 85 | Temp 97.7°F | Wt 206.2 lb

## 2021-03-30 DIAGNOSIS — C3492 Malignant neoplasm of unspecified part of left bronchus or lung: Secondary | ICD-10-CM | POA: Diagnosis not present

## 2021-03-30 DIAGNOSIS — J90 Pleural effusion, not elsewhere classified: Secondary | ICD-10-CM

## 2021-03-30 DIAGNOSIS — F419 Anxiety disorder, unspecified: Secondary | ICD-10-CM | POA: Diagnosis not present

## 2021-03-30 DIAGNOSIS — C3490 Malignant neoplasm of unspecified part of unspecified bronchus or lung: Secondary | ICD-10-CM | POA: Diagnosis not present

## 2021-03-30 DIAGNOSIS — C3432 Malignant neoplasm of lower lobe, left bronchus or lung: Secondary | ICD-10-CM

## 2021-03-30 DIAGNOSIS — Z515 Encounter for palliative care: Secondary | ICD-10-CM | POA: Diagnosis not present

## 2021-03-30 DIAGNOSIS — C7931 Secondary malignant neoplasm of brain: Secondary | ICD-10-CM | POA: Diagnosis not present

## 2021-03-30 DIAGNOSIS — I471 Supraventricular tachycardia: Secondary | ICD-10-CM | POA: Diagnosis not present

## 2021-03-30 DIAGNOSIS — Z5111 Encounter for antineoplastic chemotherapy: Secondary | ICD-10-CM | POA: Diagnosis not present

## 2021-03-30 DIAGNOSIS — F32A Depression, unspecified: Secondary | ICD-10-CM

## 2021-03-30 LAB — CYTOLOGY - NON PAP

## 2021-03-30 MED ORDER — SODIUM CHLORIDE 0.9 % IV SOLN
Freq: Once | INTRAVENOUS | Status: AC
  Filled 2021-03-30: qty 250

## 2021-03-30 MED ORDER — HEPARIN SOD (PORK) LOCK FLUSH 100 UNIT/ML IV SOLN
500.0000 [IU] | Freq: Once | INTRAVENOUS | Status: AC | PRN
  Administered 2021-03-30: 500 [IU]
  Filled 2021-03-30: qty 5

## 2021-03-30 MED ORDER — SODIUM CHLORIDE 0.9 % IV SOLN
10.0000 mg | Freq: Once | INTRAVENOUS | Status: AC
  Administered 2021-03-30: 10 mg via INTRAVENOUS
  Filled 2021-03-30: qty 10

## 2021-03-30 MED ORDER — METOPROLOL SUCCINATE ER 25 MG PO TB24: 25.0000 mg | ORAL_TABLET | Freq: Every day | ORAL | 1 refills | Status: DC

## 2021-03-30 MED ORDER — SODIUM CHLORIDE 0.9 % IV SOLN
750.0000 mg | Freq: Once | INTRAVENOUS | Status: AC
  Administered 2021-03-30: 750 mg via INTRAVENOUS
  Filled 2021-03-30: qty 75

## 2021-03-30 MED ORDER — SODIUM CHLORIDE 0.9 % IV SOLN
500.0000 mg/m2 | Freq: Once | INTRAVENOUS | Status: AC
  Administered 2021-03-30: 1100 mg via INTRAVENOUS
  Filled 2021-03-30: qty 40

## 2021-03-30 MED ORDER — SODIUM CHLORIDE 0.9 % IV SOLN
150.0000 mg | Freq: Once | INTRAVENOUS | Status: AC
  Administered 2021-03-30: 150 mg via INTRAVENOUS
  Filled 2021-03-30: qty 150

## 2021-03-30 MED ORDER — SERTRALINE HCL 50 MG PO TABS: 25.0000 mg | ORAL_TABLET | Freq: Every day | ORAL | 0 refills | Status: DC

## 2021-03-30 MED ORDER — PALONOSETRON HCL INJECTION 0.25 MG/5ML
0.2500 mg | Freq: Once | INTRAVENOUS | Status: AC
  Administered 2021-03-30: 0.25 mg via INTRAVENOUS
  Filled 2021-03-30: qty 5

## 2021-03-30 MED ORDER — DEXAMETHASONE 2 MG PO TABS: 2.0000 mg | ORAL_TABLET | Freq: Two times a day (BID) | ORAL | 0 refills | Status: DC

## 2021-03-30 MED ORDER — LORAZEPAM 0.5 MG PO TABS
0.5000 mg | ORAL_TABLET | Freq: Three times a day (TID) | ORAL | 0 refills | Status: DC
Start: 2021-03-30 — End: 2021-07-28

## 2021-03-30 MED ORDER — HEPARIN SOD (PORK) LOCK FLUSH 100 UNIT/ML IV SOLN
INTRAVENOUS | Status: AC
  Filled 2021-03-30: qty 5

## 2021-03-30 NOTE — Care Management Obs Status (Signed)
MEDICARE OBSERVATION STATUS NOTIFICATION   Patient Details  Name: KYRIAN STAGE MRN: [id number] Date of Birth: 11/21/1980   Medicare Observation Status Notification Given:  Yes    Alberteen Sam, LCSW 03/30/2021, 9:45 AM

## 2021-03-30 NOTE — Discharge Summary (Signed)
[facility] [place] NAME: [name]    MR#:  [id number]  DATE OF BIRTH:  [birth year]  DATE OF ADMISSION:  03/28/2021 ADMITTING PHYSICIAN: Collier Bullock, MD  DATE OF DISCHARGE: 03/30/2021  PRIMARY CARE PHYSICIAN: Pcp, No    ADMISSION DIAGNOSIS:  SVT (supraventricular tachycardia) (HCC) [I47.1]  DISCHARGE DIAGNOSIS:  SVT--resolved Metastatic Non small cell lung cancer  SECONDARY DIAGNOSIS:   Past Medical History:  Diagnosis Date   Cancer ([place])    Dyspnea    Heartburn    Pneumonia    Wolff-Parkinson-White (WPW) 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    HOSPITAL COURSE:  [name] is a 40 y.o. 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   Supraventricular tachycardia history of W PW syndrome --Patient had an episode of SVT while getting a Mediport inserted for his chemotherapy.Marland Kitchen --He has a known history of WPW syndrome --Patient received IV Cardizem and 6 mg of adenosine and converted to sinus tachycardia --He is stable and has been seen by cardiology Dr Ubaldo Glassing-- start metoprolol 12.5 BID--now changed to po toprl XL 5 mg qd. HR 80's SR   Non-small cell cancer of left lung/Stage IV disease Malignant pleural effusion status post thoracentesis Brain and bony mets --Status post thoracentesis with drainage of 1.2 L of pleural fluid most likely malignant pleural effusion --Status post Mediport insertion for chemotherapy-- patient to get first round of chemotherapy per Dr. Tomasa Hose to be made at the cancer center -- [name] from palliative care input noted--start Zoloft 25 mg qd --Dexamethasone for Brain mets/edema 2mg  bid at d/c rxed   Hyponatremia Most likely related to patient's known  non-small cell lung cancer   Constipation -- continue bowel supplements   Procedures:  Mediport placement, thoracentesis Family communication : Wife at bedside Consults : cardiology, oncology, palliative care CODE STATUS: full DVT Prophylaxis : ambulation. lovenox Level of care: Progressive Cardiac Status is: Observation   Overall pt improving. D/c home with outpt close f/u Dr B at cancer center CONSULTS OBTAINED:  Treatment Team:  [name], MD  DRUG ALLERGIES:   Allergies  Allergen Reactions   Codeine     Makes pt hyper    DISCHARGE MEDICATIONS:   Allergies as of 03/30/2021       Reactions   Codeine    Makes pt hyper        Medication List     TAKE these medications    amoxicillin-clavulanate 500-125 MG tablet Commonly known as: AUGMENTIN Take 1 tablet by mouth in the morning and at bedtime.   chlorpheniramine-HYDROcodone 10-8 MG/5ML Suer Commonly known as: TUSSIONEX Take 5 mLs by mouth every 12 (twelve) hours as needed for cough.   dexamethasone 2 MG tablet Commonly known as: DECADRON Take 1 tablet (2 mg total) by mouth 2 (two) times daily for 10 days. What changed:  medication strength how much to take how to take this when to take this additional instructions   folic acid 1 MG tablet Commonly known as: FOLVITE Take 1 tablet (1 mg total) by mouth daily.   HYDROcodone-acetaminophen 5-325 MG tablet Commonly known as: NORCO/VICODIN Take 1 tablet by mouth every 6 (six) hours as needed for moderate pain.   lidocaine-prilocaine cream Commonly known as:  EMLA Apply 1 application topically as needed.   metoprolol succinate 25 MG 24 hr tablet Commonly known as: Toprol XL Take 1 tablet (25 mg total) by mouth daily.   ondansetron 8 MG tablet Commonly known as: ZOFRAN One pill every 8 hours as needed for nausea/vomitting.   prochlorperazine 10 MG tablet Commonly known as: COMPAZINE Take 1 tablet (10 mg total) by mouth every 6 (six)  hours as needed for nausea or vomiting.   sertraline 50 MG tablet Commonly known as: ZOLOFT Take 0.5 tablets (25 mg total) by mouth daily. Start taking on: March 31, 2021        If you experience worsening of your admission symptoms, develop shortness of breath, life threatening emergency, suicidal or homicidal thoughts you must seek medical attention immediately by calling 911 or calling your MD immediately  if symptoms less severe.  You Must read complete instructions/literature along with all the possible adverse reactions/side effects for all the Medicines you take and that have been prescribed to you. Take any new Medicines after you have completely understood and accept all the possible adverse reactions/side effects.   Please note  You were cared for by a hospitalist during your hospital stay. If you have any questions about your discharge medications or the care you received while you were in the hospital after you are discharged, you can call the unit and asked to speak with the hospitalist on call if the hospitalist that took care of you is not available. Once you are discharged, your primary care physician will handle any further medical issues. Please note that NO REFILLS for any discharge medications will be authorized once you are discharged, as it is imperative that you return to your primary care physician (or establish a relationship with a primary care physician if you do not have one) for your aftercare needs so that they can reassess your need for medications and monitor your lab values. Today   SUBJECTIVE  Slept some better. Eating OK Wife at bedside Not new complaints HR 80's SR   VITAL SIGNS:  Blood pressure 136/86, pulse 84, temperature (!) 97.4 F (36.3 C), temperature source Oral, resp. rate 18, height 5\' 10"  (1.778 m), weight 94.3 kg, SpO2 97 %.  I/O:   Intake/Output Summary (Last 24 hours) at 03/30/2021 0844 Last data filed at 03/29/2021 1500 Gross per 24  hour  Intake 480 ml  Output --  Net 480 ml    PHYSICAL EXAMINATION:  GENERAL:  40 y.o.-year-old patient lying in the bed with no acute distress.   HEENT: Head atraumatic, normocephalic. Oropharynx and nasopharynx clear.  LUNGS: Normal breath sounds bilaterally, no wheezing, rales,rhonchi or crepitation. No use of accessory muscles of respiration.  CARDIOVASCULAR: S1, S2 normal. No murmurs, rubs, or gallops.  ABDOMEN: Soft, non-tender, non-distended. Bowel sounds present. No organomegaly or mass.  EXTREMITIES: No pedal edema, cyanosis, or clubbing.  NEUROLOGIC: Cranial nerves II through XII are intact. Muscle strength 5/5 in all extremities. Sensation intact. Gait not checked.  PSYCHIATRIC: The patient is alert and oriented x 3.  SKIN: No obvious rash, lesion, or ulcer.   DATA REVIEW:   CBC  Recent Labs  Lab 03/29/21 0632  WBC 16.9*  HGB 15.3  HCT 43.5  PLT 440*    Chemistries  Recent Labs  Lab 03/23/21 1233 03/29/21 0632  NA 133* 132*  K 4.2 4.3  CL 95* 97*  CO2 24 28  GLUCOSE 96 106*  BUN 17 13  CREATININE 0.82 0.76  CALCIUM 8.7* 8.2*  AST 50*  --   ALT 97*  --   ALKPHOS 399*  --   BILITOT 0.9  --     Microbiology Results   Recent Results (from the past 240 hour(s))  Acid Fast Smear (AFB)     Status: None   Collection Time: 03/28/21  1:30 PM   Specimen: PATH Cytology Pleural fluid  Result Value Ref Range Status   AFB Specimen Processing Concentration  Final   Acid Fast Smear Negative  Final    Comment: (NOTE) Performed At: [facility] [place], [place] [id number] Rush Farmer MD [id number]    Source (AFB) PLEURAL  Final    Comment: Performed at [facility], [address] [place], [place] [address]  Body fluid culture w Gram Stain     Status: None (Preliminary result)   Collection Time: 03/28/21  1:30 PM   Specimen: PATH Cytology Pleural fluid  Result Value Ref Range Status   Specimen Description   Final     PLEURAL Performed at [facility], [address], [place], [place] [address]    Special Requests   Final    PLEURAL Performed at [facility], [place], [place] [address]    Gram Stain NO WBC SEEN NO ORGANISMS SEEN   Final   Culture   Final    NO GROWTH < 12 HOURS Performed at [facility], [place] [address] [place],  [address]    Report Status PENDING  Incomplete    RADIOLOGY:  MR BRAIN W WO CONTRAST  Result Date: 03/28/2021 CLINICAL DATA:  Staging for lung mass EXAM: MRI HEAD WITHOUT AND WITH CONTRAST TECHNIQUE: Multiplanar, multiecho pulse sequences of the brain and surrounding structures were obtained without and with intravenous contrast. CONTRAST:  46mL GADAVIST GADOBUTROL 1 MMOL/ML IV SOLN COMPARISON:  None. FINDINGS: Brain: There are numerous contrast-enhancing lesions throughout the brain. The largest lesions are in the right frontal and parietal lobes. The largest right parietal lesion measures 11 mm with moderate surrounding edema. The largest right frontal lesion measures 10 mm with mild surrounding edema. There are greater than 20 lesions in total. No midline shift or other mass effect. No acute infarct. No acute hemorrhage. Ventricular sizes are normal. Vascular: Normal flow voids. Skull and upper cervical spine: Normal marrow signal. Sinuses/Orbits: Negative. Other: None. IMPRESSION: Numerous scattered intraparenchymal metastases. The largest lesions are in the right frontal and parietal lobes with moderate surrounding edema. No midline shift or other mass effect. Electronically Signed   By: Ulyses Jarred M.D.   On: 03/28/2021 22:27   PERIPHERAL VASCULAR CATHETERIZATION  Result Date: 03/29/2021 See surgical note for result.  DG Chest Port 1 View  Result Date: 03/28/2021 CLINICAL DATA:  Pleural effusion left, s/p thora EXAM: PORTABLE CHEST - 1 VIEW COMPARISON:  03/27/2021 PET-CT FINDINGS: No pneumothorax. Near complete  opacification of left hemithorax with central air bronchograms. Heart size difficult to assess due to adjacent opacities. On the right, diffuse pulmonary vascular congestion. Lytic lesions in bilateral ribs are identified, with probable pathologic fracture involving the right sixth rib. IMPRESSION: 1. No pneumothorax post left thoracentesis, with persistent opacification of the left hemithorax. Electronically Signed   By: Lucrezia Europe M.D.   On: 03/28/2021 14:11   US THORACENTESIS ASP PLEURAL SPACE W/IMG GUIDE  Result Date: 03/28/2021 INDICATION: Non-small cell lung carcinoma, metastatic.  Left pleural effusion. EXAM: ULTRASOUND GUIDED LEFT THORACENTESIS MEDICATIONS: Lidocaine 1% subcutaneous COMPLICATIONS:  None immediate.  No pneumothorax on follow-up radiograph. PROCEDURE: An ultrasound guided thoracentesis was thoroughly discussed with the patient and questions answered. The benefits, risks, alternatives and complications were also discussed. The patient understands and wishes to proceed with the procedure. Written consent was obtained. Ultrasound was performed to localize and mark an adequate pocket of fluid in the left chest. The area was then prepped and draped in the normal sterile fashion. 1% Lidocaine was used for local anesthesia. Under ultrasound guidance a 6 Fr Safe-T-Centesis catheter was introduced. Thoracentesis was performed. The catheter was removed and a dressing applied. FINDINGS: A total of approximately 1.2 L of clear yellow fluid was removed. IMPRESSION: Successful ultrasound guided left thoracentesis yielding 1.2 L of pleural fluid. Electronically Signed   By: Lucrezia Europe M.D.   On: 03/28/2021 14:38     CODE STATUS:     Code Status Orders  (From admission, onward)           Start     Ordered   03/28/21 1710  Full code  Continuous        03/28/21 1710           Code Status History     This patient has a current code status but no historical code status.         TOTAL TIME TAKING CARE OF THIS PATIENT: 40 minutes.    Fritzi Mandes M.D  [facility]    CC: Primary care physician; Pcp, No

## 2021-03-30 NOTE — Progress Notes (Signed)
[name]   DOB:Dec 06, 1980   RS#:[id number]    Subjective: no acute events overnight. No new cough or dyspnea. No headaches.   Objective:  Vitals:   03/30/21 0159 03/30/21 0742  BP: 122/86 136/86  Pulse: 72 84  Resp: 18 18  Temp: 98.2 F (36.8 C) (!) 97.4 F (36.3 C)  SpO2: 96% 97%    No intake or output data in the 24 hours ending 03/30/21 2011  Physical Exam Vitals and nursing note reviewed.  Constitutional:      Comments: Patient resting in the bed.  Accompanied by: wife.   HENT:     Head: Normocephalic and atraumatic.     Mouth/Throat:     Mouth: Mucous membranes are moist.     Pharynx: No oropharyngeal exudate.  Eyes:     Pupils: Pupils are equal, round, and reactive to light.  Cardiovascular:     Rate and Rhythm: Normal rate and regular rhythm.  Pulmonary:     Effort: No respiratory distress.     Breath sounds: No wheezing.     Comments: Decreased breath sounds bilaterally at bases.  No wheeze or crackles Abdominal:     General: Bowel sounds are normal. There is no distension.     Palpations: Abdomen is soft. There is no mass.     Tenderness: There is no abdominal tenderness. There is no guarding or rebound.  Musculoskeletal:        General: No tenderness. Normal range of motion.     Cervical back: Normal range of motion and neck supple.  Skin:    General: Skin is warm.  Neurological:     Mental Status: He is alert and oriented to person, place, and time.  Psychiatric:        Mood and Affect: Affect normal.        Judgment: Judgment normal.     Labs:  Lab Results  Component Value Date   WBC 16.9 (H) 03/29/2021   HGB 15.3 03/29/2021   HCT 43.5 03/29/2021   MCV 91.2 03/29/2021   PLT 440 (H) 03/29/2021    Lab Results  Component Value Date   NA 132 (L) 03/29/2021   K 4.3 03/29/2021   CL 97 (L) 03/29/2021   CO2 28 03/29/2021    Studies:  MR BRAIN W WO CONTRAST  Result Date: 03/28/2021 CLINICAL DATA:  Staging for lung mass EXAM: MRI HEAD  WITHOUT AND WITH CONTRAST TECHNIQUE: Multiplanar, multiecho pulse sequences of the brain and surrounding structures were obtained without and with intravenous contrast. CONTRAST:  11mL GADAVIST GADOBUTROL 1 MMOL/ML IV SOLN COMPARISON:  None. FINDINGS: Brain: There are numerous contrast-enhancing lesions throughout the brain. The largest lesions are in the right frontal and parietal lobes. The largest right parietal lesion measures 11 mm with moderate surrounding edema. The largest right frontal lesion measures 10 mm with mild surrounding edema. There are greater than 20 lesions in total. No midline shift or other mass effect. No acute infarct. No acute hemorrhage. Ventricular sizes are normal. Vascular: Normal flow voids. Skull and upper cervical spine: Normal marrow signal. Sinuses/Orbits: Negative. Other: None. IMPRESSION: Numerous scattered intraparenchymal metastases. The largest lesions are in the right frontal and parietal lobes with moderate surrounding edema. No midline shift or other mass effect. Electronically Signed   By: Ulyses Jarred M.D.   On: 03/28/2021 22:27    Cancer of lower lobe of left lung [facility]) #40 year old male patient with recently diagnosed adenocarcinoma the lung is currently admitted hospital  for palpitations/SVT post port placement/paracentesis.  #Adenocarcinoma of the lung-stage IV-proceed with [place] Alimta chemotherapy cycle #1 today.  We will check to expedite mutation testing results.  #Brain metastasis-MRI brain with and without contrast reviewed with the patient/family-shows multiple lesions in the brain largest of 11 mm.  Patient is currently asymptomatic.  Continue dexamethasone 2 mg twice a day.  Hold off on brain radiation-while awaiting mutation testing.   #Left pleural effusion status postthoracentesis-we will repeat chest x-ray next week.  Order patient visit.  #SVT s/p adenosine; history of WPW syndrome-stable normal sinus rhythm.   #Patient will discharge from  the hospital.  discussed with Dr. Posey Pronto; and [facility].    [name], MD 03/30/2021  8:11 PM

## 2021-03-30 NOTE — Research (Signed)
[name] Study Visit:  Patient in to the cancer center with his wife this morning for his scheduled chemotherapy appointment. Research nurse and Mauricio Po, [place] met with the patient and his wife to ensure that he still wants to participate in the [place] study. The patient states that he still would like to participate, he understands the labs will be taken after his port is accessed prior to his first treatment. Colletta Maryland, RN escorted the patient to the infusion room. Mauricio Po, CRS with the patient to obtain the required protocol lab tubes. Jeral Fruit, RN 03/30/21 10:25 AM

## 2021-03-30 NOTE — Progress Notes (Signed)
Met with patient during first chemo infusion today. All questions answered during visit. Pt stated may need work excuse while receiving treatment. Informed pt to let me know if he needs a letter for his employer or if we need to fill any forms out for FMLA/disability. No further questions or needs at this time. Instructed to call with any questions or needs. Pt verbalized understanding.

## 2021-03-30 NOTE — Plan of Care (Signed)

## 2021-03-30 NOTE — Patient Instructions (Signed)
[facility] ONCOLOGY  Discharge Instructions: Thank you for choosing [facility] to provide your oncology and hematology care.  If you have a lab appointment with the [facility], please go directly to the [employer] and check in at the registration area.  Wear comfortable clothing and clothing appropriate for easy access to any Portacath or PICC line.   We strive to give you quality time with your provider. You may need to reschedule your appointment if you arrive late (15 or more minutes).  Arriving late affects you and other patients whose appointments are after yours.  Also, if you miss three or more appointments without notifying the office, you may be dismissed from the clinic at the provider's discretion.      For prescription refill requests, have your pharmacy contact our office and allow 72 hours for refills to be completed.    Today you received the following chemotherapy and/or immunotherapy agents : Alimta / Carboplatin     To help prevent nausea and vomiting after your treatment, we encourage you to take your nausea medication as directed.  BELOW ARE SYMPTOMS THAT SHOULD BE REPORTED IMMEDIATELY: *FEVER GREATER THAN 100.4 F (38 C) OR HIGHER *CHILLS OR SWEATING *NAUSEA AND VOMITING THAT IS NOT CONTROLLED WITH YOUR NAUSEA MEDICATION *UNUSUAL SHORTNESS OF BREATH *UNUSUAL BRUISING OR BLEEDING *URINARY PROBLEMS (pain or burning when urinating, or frequent urination) *BOWEL PROBLEMS (unusual diarrhea, constipation, pain near the anus) TENDERNESS IN MOUTH AND THROAT WITH OR WITHOUT PRESENCE OF ULCERS (sore throat, sores in mouth, or a toothache) UNUSUAL RASH, SWELLING OR PAIN  UNUSUAL VAGINAL DISCHARGE OR ITCHING   Items with * indicate a potential emergency and should be followed up as soon as possible or go to the Emergency Department if any problems should occur.  Please show the CHEMOTHERAPY ALERT CARD or IMMUNOTHERAPY ALERT CARD  at check-in to the Emergency Department and triage nurse.  Should you have questions after your visit or need to cancel or reschedule your appointment, please contact [facility]  [phone] and follow the prompts.  Office hours are 8:00 a.m. to 4:30 p.m. Monday - Friday. Please note that voicemails left after 4:00 p.m. may not be returned until the following business day.  We are closed weekends and major holidays. You have access to a nurse at all times for urgent questions. Please call the main number to the clinic [phone] and follow the prompts.  For any non-urgent questions, you may also contact your provider using MyChart. We now offer e-Visits for anyone 26 and older to request care online for non-urgent symptoms. For details visit [email or web address].   Also download the MyChart app! Go to the app store, search "MyChart", open the app, select [facility], and log in with your MyChart username and password.  Due to Covid, a mask is required upon entering the hospital/clinic. If you do not have a mask, one will be given to you upon arrival. For doctor visits, patients may have 1 support person aged 12 or older with them. For treatment visits, patients cannot have anyone with them due to current Covid guidelines and our immunocompromised population.    Pemetrexed injection What is this medication? PEMETREXED (PEM e TREX ed) is a chemotherapy drug used to treat lung cancers like non-small cell lung cancer and mesothelioma. It may also be used to treatother cancers. This medicine may be used for other purposes; ask your health care provider orpharmacist if you have  questions. COMMON BRAND NAME(S): Alimta What should I tell my care team before I take this medication? They need to know if you have any of these conditions: infection (especially a virus infection such as chickenpox, cold sores, or herpes) kidney disease low blood counts, like low  white cell, platelet, or red cell counts lung or breathing disease, like asthma radiation therapy an unusual or allergic reaction to pemetrexed, other medicines, foods, dyes, or preservative pregnant or trying to get pregnant breast-feeding How should I use this medication? This drug is given as an infusion into a vein. It is administered in a hospitalor clinic by a specially trained health care professional. Talk to your pediatrician regarding the use of this medicine in children.Special care may be needed. Overdosage: If you think you have taken too much of this medicine contact apoison control center or emergency room at once. NOTE: This medicine is only for you. Do not share this medicine with others. What if I miss a dose? It is important not to miss your dose. Call your doctor or health careprofessional if you are unable to keep an appointment. What may interact with this medication? This medicine may interact with the following medications: Ibuprofen This list may not describe all possible interactions. Give your health care provider a list of all the medicines, herbs, non-prescription drugs, or dietary supplements you use. Also tell them if you smoke, drink alcohol, or use illegaldrugs. Some items may interact with your medicine. What should I watch for while using this medication? Visit your doctor for checks on your progress. This drug may make you feel generally unwell. This is not uncommon, as chemotherapy can affect healthy cells as well as cancer cells. Report any side effects. Continue your course oftreatment even though you feel ill unless your doctor tells you to stop. In some cases, you may be given additional medicines to help with side effects.Follow all directions for their use. Call your doctor or health care professional for advice if you get a fever, chills or sore throat, or other symptoms of a cold or flu. Do not treat yourself. This drug decreases your body's ability to  fight infections. Try toavoid being around people who are sick. This medicine may increase your risk to bruise or bleed. Call your doctor orhealth care professional if you notice any unusual bleeding. Be careful brushing and flossing your teeth or using a toothpick because you may get an infection or bleed more easily. If you have any dental work done,tell your dentist you are receiving this medicine. Avoid taking products that contain aspirin, acetaminophen, ibuprofen, naproxen, or ketoprofen unless instructed by your doctor. These medicines may hide afever. Call your doctor or health care professional if you get diarrhea or mouthsores. Do not treat yourself. To protect your kidneys, drink water or other fluids as directed while you aretaking this medicine. Do not become pregnant while taking this medicine or for 6 months after stopping it. Women should inform their doctor if they wish to become pregnant or think they might be pregnant. Men should not father a child while taking this medicine and for 3 months after stopping it. This may interfere with the ability to father a child. You should talk to your doctor or health care professional if you are concerned about your fertility. There is a potential for serious side effects to an unborn child. Talk to your health care professional or pharmacist for more information. Do not breast-feed an infantwhile taking this medicine or for 1  week after stopping it. What side effects may I notice from receiving this medication? Side effects that you should report to your doctor or health care professionalas soon as possible: allergic reactions like skin rash, itching or hives, swelling of the face, lips, or tongue breathing problems redness, blistering, peeling or loosening of the skin, including inside the mouth signs and symptoms of bleeding such as bloody or black, tarry stools; red or dark-brown urine; spitting up blood or brown material that looks like coffee  grounds; red spots on the skin; unusual bruising or bleeding from the eye, gums, or nose signs and symptoms of infection like fever or chills; cough; sore throat; pain or trouble passing urine signs and symptoms of kidney injury like trouble passing urine or change in the amount of urine signs and symptoms of liver injury like dark yellow or brown urine; general ill feeling or flu-like symptoms; light-colored stools; loss of appetite; nausea; right upper belly pain; unusually weak or tired; yellowing of the eyes or skin Side effects that usually do not require medical attention (report to yourdoctor or health care professional if they continue or are bothersome): constipation mouth sores nausea, vomiting unusually weak or tired This list may not describe all possible side effects. Call your doctor for medical advice about side effects. You may report side effects to FDA at1-800-FDA-1088. Where should I keep my medication? This drug is given in a hospital or clinic and will not be stored at home. NOTE: This sheet is a summary. It may not cover all possible information. If you have questions about this medicine, talk to your doctor, pharmacist, orhealth care provider.  2022 Elsevier/Gold Standard (2017-11-20 16:11:33)    Carboplatin injection What is this medication? CARBOPLATIN (KAR boe pla tin) is a chemotherapy drug. It targets fast dividing cells, like cancer cells, and causes these cells to die. This medicine is usedto treat ovarian cancer and many other cancers. This medicine may be used for other purposes; ask your health care provider orpharmacist if you have questions. COMMON BRAND NAME(S): Paraplatin What should I tell my care team before I take this medication? They need to know if you have any of these conditions: blood disorders hearing problems kidney disease recent or ongoing radiation therapy an unusual or allergic reaction to carboplatin, cisplatin, other chemotherapy,  other medicines, foods, dyes, or preservatives pregnant or trying to get pregnant breast-feeding How should I use this medication? This drug is usually given as an infusion into a vein. It is administered in [place] or clinic by a specially trained health care professional. Talk to your pediatrician regarding the use of this medicine in children.Special care may be needed. Overdosage: If you think you have taken too much of this medicine contact apoison control center or emergency room at once. NOTE: This medicine is only for you. Do not share this medicine with others. What if I miss a dose? It is important not to miss a dose. Call your doctor or health careprofessional if you are unable to keep an appointment. What may interact with this medication? medicines for seizures medicines to increase blood counts like filgrastim, pegfilgrastim, sargramostim some antibiotics like amikacin, gentamicin, neomycin, streptomycin, tobramycin vaccines Talk to your doctor or health care professional before taking any of thesemedicines: acetaminophen aspirin ibuprofen ketoprofen naproxen This list may not describe all possible interactions. Give your health care provider a list of all the medicines, herbs, non-prescription drugs, or dietary supplements you use. Also tell them if you smoke, drink alcohol,  or use illegaldrugs. Some items may interact with your medicine. What should I watch for while using this medication? Your condition will be monitored carefully while you are receiving this medicine. You will need important blood work done while you are taking thismedicine. This drug may make you feel generally unwell. This is not uncommon, as chemotherapy can affect healthy cells as well as cancer cells. Report any side effects. Continue your course of treatment even though you feel ill unless yourdoctor tells you to stop. In some cases, you may be given additional medicines to help with side  effects.Follow all directions for their use. Call your doctor or health care professional for advice if you get a fever, chills or sore throat, or other symptoms of a cold or flu. Do not treat yourself. This drug decreases your body's ability to fight infections. Try toavoid being around people who are sick. This medicine may increase your risk to bruise or bleed. Call your doctor orhealth care professional if you notice any unusual bleeding. Be careful brushing and flossing your teeth or using a toothpick because you may get an infection or bleed more easily. If you have any dental work done,tell your dentist you are receiving this medicine. Avoid taking products that contain aspirin, acetaminophen, ibuprofen, naproxen, or ketoprofen unless instructed by your doctor. These medicines may hide afever. Do not become pregnant while taking this medicine. Women should inform their doctor if they wish to become pregnant or think they might be pregnant. There is a potential for serious side effects to an unborn child. Talk to your health care professional or pharmacist for more information. Do not breast-feed aninfant while taking this medicine. What side effects may I notice from receiving this medication? Side effects that you should report to your doctor or health care professionalas soon as possible: allergic reactions like skin rash, itching or hives, swelling of the face, lips, or tongue signs of infection - fever or chills, cough, sore throat, pain or difficulty passing urine signs of decreased platelets or bleeding - bruising, pinpoint red spots on the skin, black, tarry stools, nosebleeds signs of decreased red blood cells - unusually weak or tired, fainting spells, lightheadedness breathing problems changes in hearing changes in vision chest pain high blood pressure low blood counts - This drug may decrease the number of white blood cells, red blood cells and platelets. You may be at increased  risk for infections and bleeding. nausea and vomiting pain, swelling, redness or irritation at the injection site pain, tingling, numbness in the hands or feet problems with balance, talking, walking trouble passing urine or change in the amount of urine Side effects that usually do not require medical attention (report to yourdoctor or health care professional if they continue or are bothersome): hair loss loss of appetite metallic taste in the mouth or changes in taste This list may not describe all possible side effects. Call your doctor for medical advice about side effects. You may report side effects to FDA at1-800-FDA-1088. Where should I keep my medication? This drug is given in a hospital or clinic and will not be stored at home. NOTE: This sheet is a summary. It may not cover all possible information. If you have questions about this medicine, talk to your doctor, pharmacist, orhealth care provider.  2022 Elsevier/Gold Standard (2008-01-06 14:38:05)

## 2021-03-30 NOTE — Progress Notes (Signed)
[place]  [phone] [phone]   Name: [name] Date: 03/30/2021 MRN: [id number]  DOB: 07/11/1981  Patient Care Team: Pcp, No as PCP - General Nathan Nab, RN as Oncology Nurse Navigator    REASON FOR CONSULTATION: [place] [name] is a 40 y.o. 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  Palliative care was consulted up address goals and manage ongoing symptoms..   SOCIAL HISTORY:     reports that he has never smoked. His smokeless tobacco use includes snuff and chew. He reports that he does not drink alcohol and does not use drugs.  Patient is married and lives at home with his wife and 31 year old daughter.  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  ADVANCE DIRECTIVES:  Does not have  CODE STATUS: Full code  PAST MEDICAL HISTORY: Past Medical History:  Diagnosis Date   Cancer ([place])    Dyspnea    Heartburn    Pneumonia    Wolff-Parkinson-White (WPW) 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    PAST SURGICAL HISTORY:  Past Surgical History:  Procedure Laterality Date   FRACTURE SURGERY     broke femur when he was 6 years   PORTA CATH INSERTION N/A 03/28/2021   Procedure: PORTA CATH INSERTION;  Surgeon: Katha Cabal, MD;  Location: [facility];  Service: Cardiovascular;  Laterality: N/A;   VIDEO BRONCHOSCOPY WITH ENDOBRONCHIAL NAVIGATION N/A 03/15/2021   Procedure: VIDEO BRONCHOSCOPY WITH ENDOBRONCHIAL NAVIGATION;  Surgeon: Ottie Glazier, MD;  Location: [facility];  Service: Thoracic;  Laterality: N/A;   VIDEO BRONCHOSCOPY WITH ENDOBRONCHIAL ULTRASOUND N/A 03/15/2021   Procedure: VIDEO BRONCHOSCOPY WITH ENDOBRONCHIAL ULTRASOUND;  Surgeon: Ottie Glazier, MD;  Location: [facility];   Service: Thoracic;  Laterality: N/A;    HEMATOLOGY/ONCOLOGY HISTORY:  Oncology History Overview Note  IMPRESSION: 1. Patchy nodular fat stranding throughout the anterior left upper quadrant peritoneal fat, nonspecific, cannot exclude peritoneal carcinomatosis. Dedicated CT abdomen/pelvis with oral and IV contrast recommended for further evaluation. 2. Dense patchy consolidation replacing much of the left lower lung lobe, appearing masslike in the superior segment left lower lobe, with associated bulging of the left major fissure and associated left lower lobe volume loss. Fine nodularity throughout both lungs with an upper lobe predominance. Asymmetric left upper lobe interlobular septal thickening. These findings are indeterminate, with differential including multilobar pneumonia, sarcoidosis or a neoplastic process. The persistence on radiographs back to 01/20/2021 despite antibiotic therapy make sarcoidosis or a neoplastic process more likely. Pulmonology consultation suggested for consideration of bronchoscopic evaluation. 3. Small dependent left pleural effusion. 4. Mild mediastinal lymphadenopathy, nonspecific. 5. Subacute healing lateral right sixth rib fracture.   DIAGNOSIS:  A. LUNG, LEFT LOWER LOBE; ENB-ASSISTED BIOPSY:  - NON-SMALL CELL CARCINOMA, FAVOR ADENOCARCINOMA.  - FOREIGN MATERIAL SUGGESTIVE OF ASPIRATION.   There is sufficient material for limited ancillary studi   Cancer of lower lobe of left lung ([place])  03/23/2021 Initial Diagnosis   Cancer of lower lobe of left lung ([place])   03/29/2021 Cancer Staging   Staging form: Lung, AJCC 8th Edition - Clinical: Stage IVB (cT3, cN3, pM1c) - Signed by [name], MD on 03/29/2021    03/30/2021 -  Chemotherapy    Patient is on Treatment Plan: LUNG NSCLC PEMETREXED (ALIMTA) / CARBOPLATIN Q21D X 1  CYCLES         ALLERGIES:  is allergic to codeine.  MEDICATIONS:  Current Outpatient Medications   Medication Sig Dispense Refill   amoxicillin-clavulanate (AUGMENTIN) 500-125 MG tablet Take 1 tablet by mouth in the morning and at bedtime.     chlorpheniramine-HYDROcodone (TUSSIONEX) 10-8 MG/5ML SUER Take 5 mLs by mouth every 12 (twelve) hours as needed for cough. 140 mL 0   dexamethasone (DECADRON) 2 MG tablet Take 1 tablet (2 mg total) by mouth 2 (two) times daily for 10 days. 20 tablet 0   folic acid (FOLVITE) 1 MG tablet Take 1 tablet (1 mg total) by mouth daily. 90 tablet 1   HYDROcodone-acetaminophen (NORCO/VICODIN) 5-325 MG tablet Take 1 tablet by mouth every 6 (six) hours as needed for moderate pain. 90 tablet 0   lidocaine-prilocaine (EMLA) cream Apply 1 application topically as needed. 30 g 0   metoprolol succinate (TOPROL XL) 25 MG 24 hr tablet Take 1 tablet (25 mg total) by mouth daily. 30 tablet 1   ondansetron (ZOFRAN) 8 MG tablet One pill every 8 hours as needed for nausea/vomitting. 40 tablet 1   prochlorperazine (COMPAZINE) 10 MG tablet Take 1 tablet (10 mg total) by mouth every 6 (six) hours as needed for nausea or vomiting. 40 tablet 1   [START ON 03/31/2021] sertraline (ZOLOFT) 50 MG tablet Take 0.5 tablets (25 mg total) by mouth daily. 30 tablet 0   No current facility-administered medications for this visit.   Facility-Administered Medications Ordered in Other Visits  Medication Dose Route Frequency Provider Last Rate Last Admin   heparin lock flush 100 unit/mL  500 Units Intracatheter Once PRN [name], MD        VITAL SIGNS: There were no vitals taken for this visit. There were no vitals filed for this visit.  Estimated body mass index is 29.59 kg/m as calculated from the following:   Height as of 03/28/21: 5\' 10"  (1.778 m).   Weight as of an earlier encounter on 03/30/21: 206 lb 4 oz (93.6 kg).  LABS: CBC:    Component Value Date/Time   WBC 16.9 (H) 03/29/2021 0632   HGB 15.3 03/29/2021 0632   HCT 43.5 03/29/2021 0632   PLT 440 (H) 03/29/2021  0632   MCV 91.2 03/29/2021 0632   Comprehensive Metabolic Panel:    Component Value Date/Time   NA 132 (L) 03/29/2021 0632   K 4.3 03/29/2021 0632   CL 97 (L) 03/29/2021 0632   CO2 28 03/29/2021 0632   BUN 13 03/29/2021 0632   CREATININE 0.76 03/29/2021 0632   GLUCOSE 106 (H) 03/29/2021 0632   CALCIUM 8.2 (L) 03/29/2021 0632   AST 50 (H) 03/23/2021 1233   ALT 97 (H) 03/23/2021 1233   ALKPHOS 399 (H) 03/23/2021 1233   BILITOT 0.9 03/23/2021 1233   PROT 7.1 03/23/2021 1233   ALBUMIN 3.2 (L) 03/23/2021 1233    RADIOGRAPHIC STUDIES: MR BRAIN W WO CONTRAST  Result Date: 03/28/2021 CLINICAL DATA:  Staging for lung mass EXAM: MRI HEAD WITHOUT AND WITH CONTRAST TECHNIQUE: Multiplanar, multiecho pulse sequences of the brain and surrounding structures were obtained without and with intravenous contrast. CONTRAST:  32mL GADAVIST GADOBUTROL 1 MMOL/ML IV SOLN COMPARISON:  None. FINDINGS: Brain: There are numerous contrast-enhancing lesions throughout the brain. The largest lesions are in the right frontal and parietal lobes. The largest right parietal lesion measures 11 mm with moderate surrounding edema. The largest right frontal lesion measures 10 mm with mild surrounding  edema. There are greater than 20 lesions in total. No midline shift or other mass effect. No acute infarct. No acute hemorrhage. Ventricular sizes are normal. Vascular: Normal flow voids. Skull and upper cervical spine: Normal marrow signal. Sinuses/Orbits: Negative. Other: None. IMPRESSION: Numerous scattered intraparenchymal metastases. The largest lesions are in the right frontal and parietal lobes with moderate surrounding edema. No midline shift or other mass effect. Electronically Signed   By: Ulyses Jarred M.D.   On: 03/28/2021 22:27   PERIPHERAL VASCULAR CATHETERIZATION  Result Date: 03/29/2021 See surgical note for result.  NM PET Image Initial (PI) Skull Base To Thigh  Result Date: 03/28/2021 CLINICAL DATA:  Initial  treatment strategy for non-small cell lung carcinoma. EXAM: NUCLEAR MEDICINE PET SKULL BASE TO THIGH TECHNIQUE: 10.7 mCi F-18 FDG was injected intravenously. Full-ring PET imaging was performed from the skull base to thigh after the radiotracer. CT data was obtained and used for attenuation correction and anatomic localization. Fasting blood glucose: 90 mg/dl COMPARISON:  03/15/2021 FINDINGS: Mediastinal blood-pool activity (background): SUV max = 2.6 Liver activity (reference): SUV max = N/A NECK: 6 mm left supraclavicular lymph node on image 16/1 is hypermetabolic, with SUV max of 3.6. Incidental CT findings:  None. CHEST: Left lung collapse is seen with a large hypermetabolic masslike opacity in the perihilar region and left lower lobe, which measures approximately 7.0 x 5.8 cm on image 101/3 and has SUV max of 17.5. Hypermetabolic activity in the collapsed left upper lobe is likely secondary to atelectasis. Large left pleural effusion causes mediastinal shift to the right and shows pleural base areas mild FDG uptake, highly suspicious for malignant pleural effusion. A 10 mm subcarinal lymph node shows FDG uptake with SUV max of 3.9. A 10 mm right paratracheal lymph node shows FDG uptake with SUV max of 3.9. Incidental CT findings:  None. ABDOMEN/PELVIS: No abnormal hypermetabolic activity within the liver, pancreas, adrenal glands, or spleen. 1.7 cm hypermetabolic soft tissue density in the splenic hilum has SUV max of 6.2, and may represent a hypermetabolic lymph node or peritoneal implant. Ill-defined soft tissue density in the left upper quadrant omental fat is hypermetabolic with SUV max of 4.5, consistent with omental carcinomatosis. Hypermetabolic activity is seen involving the peritoneum in dependent pelvis, also suspicious for peritoneal carcinomatosis. No other hypermetabolic soft tissue masses or lymphadenopathy identified. Incidental CT findings:  None. SKELETON: Numerous hypermetabolic bone  metastases are seen in the bilateral scapulae, bilateral ribs, thoracic and lumbosacral spine, and pelvis, most of which are lytic in appearance. Incidental CT findings:  None. IMPRESSION: Left lung collapse with 7 cm hypermetabolic masslike opacity centered in the left lower lobe and involving the left hilum, consistent with primary bronchogenic carcinoma. Large malignant left pleural effusion. Hypermetabolic lymphadenopathy in mediastinum and left supraclavicular region. Peritoneal/omental carcinomatosis. Diffuse bone metastases. Electronically Signed   By: Marlaine Hind M.D.   On: 03/28/2021 09:59   DG Chest Port 1 View  Result Date: 03/28/2021 CLINICAL DATA:  Pleural effusion left, s/p thora EXAM: PORTABLE CHEST - 1 VIEW COMPARISON:  03/27/2021 PET-CT FINDINGS: No pneumothorax. Near complete opacification of left hemithorax with central air bronchograms. Heart size difficult to assess due to adjacent opacities. On the right, diffuse pulmonary vascular congestion. Lytic lesions in bilateral ribs are identified, with probable pathologic fracture involving the right sixth rib. IMPRESSION: 1. No pneumothorax post left thoracentesis, with persistent opacification of the left hemithorax. Electronically Signed   By: Lucrezia Europe M.D.   On: 03/28/2021 14:11  DG Chest Port 1 View  Result Date: 03/15/2021 CLINICAL DATA:  Post bronchoscopy on the left EXAM: PORTABLE CHEST 1 VIEW COMPARISON:  CT chest 03/15/2021 FINDINGS: Mild to moderate left pleural effusion. Left lower lobe consolidation. No pneumothorax. Mild right lower lobe airspace disease appears new from the CT today. There is mild vascular congestion which may be due to mild fluid overload IMPRESSION: No pneumothorax post bronchoscopy Persistent left pleural effusion and left lower lobe consolidation/mass Progression of right lower lobe airspace disease Pulmonary vascular  congestion.  Question fluid overload Electronically Signed   By: Franchot Gallo M.D.    On: 03/15/2021 15:55   DG C-Arm 1-60 Min-No Report  Result Date: 03/15/2021 Fluoroscopy was utilized by the requesting physician.  No radiographic interpretation.   CT Super D Chest Wo Contrast  Result Date: 03/15/2021 CLINICAL DATA:  Recent pneumonia.  Cough. EXAM: CT CHEST WITHOUT CONTRAST TECHNIQUE: Multidetector CT imaging of the chest was performed using thin slice collimation for electromagnetic bronchoscopy planning purposes, without intravenous contrast. COMPARISON:  02/23/2021 FINDINGS: Cardiovascular: The heart size is normal. No substantial pericardial effusion. No thoracic aortic aneurysm. Mediastinum/Nodes: 10 mm short axis right paratracheal node evident. 10 mm short axis subcarinal lymph node associated. No definite right hilar lymphadenopathy on this noncontrast study. Soft tissue fullness noted in the left hilum although assessment limited by lack of intravenous contrast material. The esophagus has normal imaging features. There is no axillary lymphadenopathy. Lungs/Pleura: Diffuse micro nodularity again noted bilaterally with an upper lung predominance. The collapse/consolidative change in the left lower lobe is similar to prior with 6.1 x 4.0 cm measured masslike consolidative opacity stable at 6.1 x 4.5 cm today. Small left pleural effusion evident. Upper Abdomen: Nodular soft tissue attenuation in the left upper quadrant/omental region is again noted. Musculoskeletal: 11 mm lucency is identified in the T10 vertebral body (43/2) IMPRESSION: 1. No substantial interval change in the dense consolidative opacity involving the left lower lobe with associated small left pleural effusion. 2. Bilateral relatively symmetric micro nodularity demonstrates an upper lobe predominance, unchanged. This may be infectious/inflammatory in etiology. 3. Similar appearance of the ill-defined nodular soft tissue involving the anterior left upper quadrant, likely omental. Given that metastatic disease could  certainly have this appearance, dedicated abdomen/pelvis CT with oral and intravenous contrast recommended to further evaluate. Electronically Signed   By: Misty Stanley M.D.   On: 03/15/2021 12:48   US THORACENTESIS ASP PLEURAL SPACE W/IMG GUIDE  Result Date: 03/28/2021 INDICATION: Non-small cell lung carcinoma, metastatic.  Left pleural effusion. EXAM: ULTRASOUND GUIDED LEFT THORACENTESIS MEDICATIONS: Lidocaine 1% subcutaneous COMPLICATIONS: None immediate.  No pneumothorax on follow-up radiograph. PROCEDURE: An ultrasound guided thoracentesis was thoroughly discussed with the patient and questions answered. The benefits, risks, alternatives and complications were also discussed. The patient understands and wishes to proceed with the procedure. Written consent was obtained. Ultrasound was performed to localize and mark an adequate pocket of fluid in the left chest. The area was then prepped and draped in the normal sterile fashion. 1% Lidocaine was used for local anesthesia. Under ultrasound guidance a 6 Fr Safe-T-Centesis catheter was introduced. Thoracentesis was performed. The catheter was removed and a dressing applied. FINDINGS: A total of approximately 1.2 L of clear yellow fluid was removed. IMPRESSION: Successful ultrasound guided left thoracentesis yielding 1.2 L of pleural fluid. Electronically Signed   By: Lucrezia Europe M.D.   On: 03/28/2021 14:38    PERFORMANCE STATUS (ECOG) : 1 - Symptomatic but completely ambulatory  Review of Systems Unless otherwise noted, a complete review of systems is negative.  Physical Exam General: NAD Pulmonary: unlabored Extremities: no edema, no joint deformities Skin: no rashes Neurological: nonfocal  IMPRESSION: Patient was discharged from the hospital this AM and was seen in infusion while he was receiving chemotherapy. Patient says that his anxiety has lessened now that he is out of the hospital. He was started yesterday on low-dose Zoloft and he  understands that it may take weeks for this to be fully effective. We may have to increase dose over time.   I had started patient on lorazepam in the hospital for PRN anxiety but it does not appear that he was sent home with a prescription. Will send Rx to pharmacy.   PLAN: -Continue current scope of treatment -Continue Zoloft 25mg  daily -Lorazepam 0.5mg  Q8H PRN for anxiety, #30 -RTC 3 weeks   Patient expressed understanding and was in agreement with this plan. He also understands that He can call the clinic at any time with any questions, concerns, or complaints.     Time Total: 15 minutes  Visit consisted of counseling and education dealing with the complex and emotionally intense issues of symptom management and palliative care in the setting of serious and potentially life-threatening illness.Greater than 50%  of this time was spent counseling and coordinating care related to the above assessment and plan.  Signed by: Altha Harm, PhD, NP-C

## 2021-03-31 ENCOUNTER — Telehealth: Payer: Self-pay

## 2021-03-31 ENCOUNTER — Other Ambulatory Visit: Payer: Managed Care, Other (non HMO)

## 2021-03-31 ENCOUNTER — Ambulatory Visit: Payer: Managed Care, Other (non HMO)

## 2021-03-31 ENCOUNTER — Ambulatory Visit: Payer: Managed Care, Other (non HMO) | Admitting: Internal Medicine

## 2021-03-31 NOTE — Telephone Encounter (Signed)
Telephone call to patient for follow up after receiving first infusion.   Patient states infusion went great. States is feeling tired but just got out of hospital.  States eating good and drinking plenty of fluids.   Denies any nausea or vomiting.  Encouraged patient to call for any concerns or questions.

## 2021-04-01 LAB — CHOLESTEROL, BODY FLUID: Cholesterol, Fluid: 109 mg/dL

## 2021-04-01 LAB — BODY FLUID CULTURE W GRAM STAIN
Culture: NO GROWTH
Gram Stain: NONE SEEN

## 2021-04-03 ENCOUNTER — Inpatient Hospital Stay: Payer: Managed Care, Other (non HMO)

## 2021-04-03 ENCOUNTER — Encounter: Payer: Self-pay | Admitting: Hospice and Palliative Medicine

## 2021-04-03 ENCOUNTER — Telehealth: Payer: Self-pay | Admitting: *Deleted

## 2021-04-03 ENCOUNTER — Other Ambulatory Visit: Payer: Self-pay

## 2021-04-03 ENCOUNTER — Ambulatory Visit
Admission: RE | Admit: 2021-04-03 | Discharge: 2021-04-03 | Disposition: A | Discharge status: Home / Self Care | Payer: Managed Care, Other (non HMO) | Source: Ambulatory Visit | Attending: Hospice and Palliative Medicine | Admitting: Hospice and Palliative Medicine

## 2021-04-03 ENCOUNTER — Other Ambulatory Visit: Payer: Self-pay | Admitting: *Deleted

## 2021-04-03 ENCOUNTER — Inpatient Hospital Stay (HOSPITAL_BASED_OUTPATIENT_CLINIC_OR_DEPARTMENT_OTHER): Payer: Managed Care, Other (non HMO) | Admitting: Hospice and Palliative Medicine

## 2021-04-03 ENCOUNTER — Ambulatory Visit
Admission: RE | Admit: 2021-04-03 | Discharge: 2021-04-03 | Disposition: A | Discharge status: Home / Self Care | Payer: Managed Care, Other (non HMO) | Attending: Hospice and Palliative Medicine | Admitting: Hospice and Palliative Medicine

## 2021-04-03 VITALS — BP 119/81 | HR 78 | Temp 97.0°F | Resp 20

## 2021-04-03 DIAGNOSIS — C3492 Malignant neoplasm of unspecified part of left bronchus or lung: Secondary | ICD-10-CM

## 2021-04-03 DIAGNOSIS — Z95828 Presence of other vascular implants and grafts: Secondary | ICD-10-CM

## 2021-04-03 DIAGNOSIS — J90 Pleural effusion, not elsewhere classified: Secondary | ICD-10-CM | POA: Diagnosis not present

## 2021-04-03 DIAGNOSIS — Z5189 Encounter for other specified aftercare: Secondary | ICD-10-CM

## 2021-04-03 DIAGNOSIS — R0902 Hypoxemia: Secondary | ICD-10-CM | POA: Diagnosis not present

## 2021-04-03 DIAGNOSIS — E871 Hypo-osmolality and hyponatremia: Secondary | ICD-10-CM

## 2021-04-03 LAB — CBC WITH DIFFERENTIAL/PLATELET
Abs Immature Granulocytes: 0.15 10*3/uL — ABNORMAL HIGH (ref 0.00–0.07)
Basophils Absolute: 0 10*3/uL (ref 0.0–0.1)
Basophils Relative: 0 %
Eosinophils Absolute: 0.3 10*3/uL (ref 0.0–0.5)
Eosinophils Relative: 2 %
HCT: 46.1 % (ref 39.0–52.0)
Hemoglobin: 16 g/dL (ref 13.0–17.0)
Immature Granulocytes: 1 %
Lymphocytes Relative: 7 %
Lymphs Abs: 1.4 10*3/uL (ref 0.7–4.0)
MCH: 30.9 pg (ref 26.0–34.0)
MCHC: 34.7 g/dL (ref 30.0–36.0)
MCV: 89.2 fL (ref 80.0–100.0)
Monocytes Absolute: 0.1 10*3/uL (ref 0.1–1.0)
Monocytes Relative: 1 %
Neutro Abs: 16.8 10*3/uL — ABNORMAL HIGH (ref 1.7–7.7)
Neutrophils Relative %: 89 %
Platelets: 306 10*3/uL (ref 150–400)
RBC: 5.17 MIL/uL (ref 4.22–5.81)
RDW: 11.6 % (ref 11.5–15.5)
WBC: 18.6 10*3/uL — ABNORMAL HIGH (ref 4.0–10.5)
nRBC: 0 % (ref 0.0–0.2)

## 2021-04-03 LAB — COMPREHENSIVE METABOLIC PANEL
ALT: 143 U/L — ABNORMAL HIGH (ref 0–44)
AST: 55 U/L — ABNORMAL HIGH (ref 15–41)
Albumin: 2.8 g/dL — ABNORMAL LOW (ref 3.5–5.0)
Alkaline Phosphatase: 394 U/L — ABNORMAL HIGH (ref 38–126)
Anion gap: 8 (ref 5–15)
BUN: 17 mg/dL (ref 6–20)
CO2: 27 mmol/L (ref 22–32)
Calcium: 8.2 mg/dL — ABNORMAL LOW (ref 8.9–10.3)
Chloride: 94 mmol/L — ABNORMAL LOW (ref 98–111)
Creatinine, Ser: 0.67 mg/dL (ref 0.61–1.24)
GFR, Estimated: >60 mL/min (ref >60)
Glucose, Bld: 96 mg/dL (ref 70–99)
Potassium: 3.8 mmol/L (ref 3.5–5.1)
Sodium: 129 mmol/L — ABNORMAL LOW (ref 135–145)
Total Bilirubin: 0.9 mg/dL (ref 0.3–1.2)
Total Protein: 6.2 g/dL — ABNORMAL LOW (ref 6.5–8.1)

## 2021-04-03 IMAGING — CR DG CHEST 2V
2 series · 2 of 2 positions shown · non-contrast
Comparison: [DATE]

CLINICAL DATA: Hypoxia, pleural effusion and lung cancer.

EXAM:
CHEST - 2 VIEW

[chest pa]
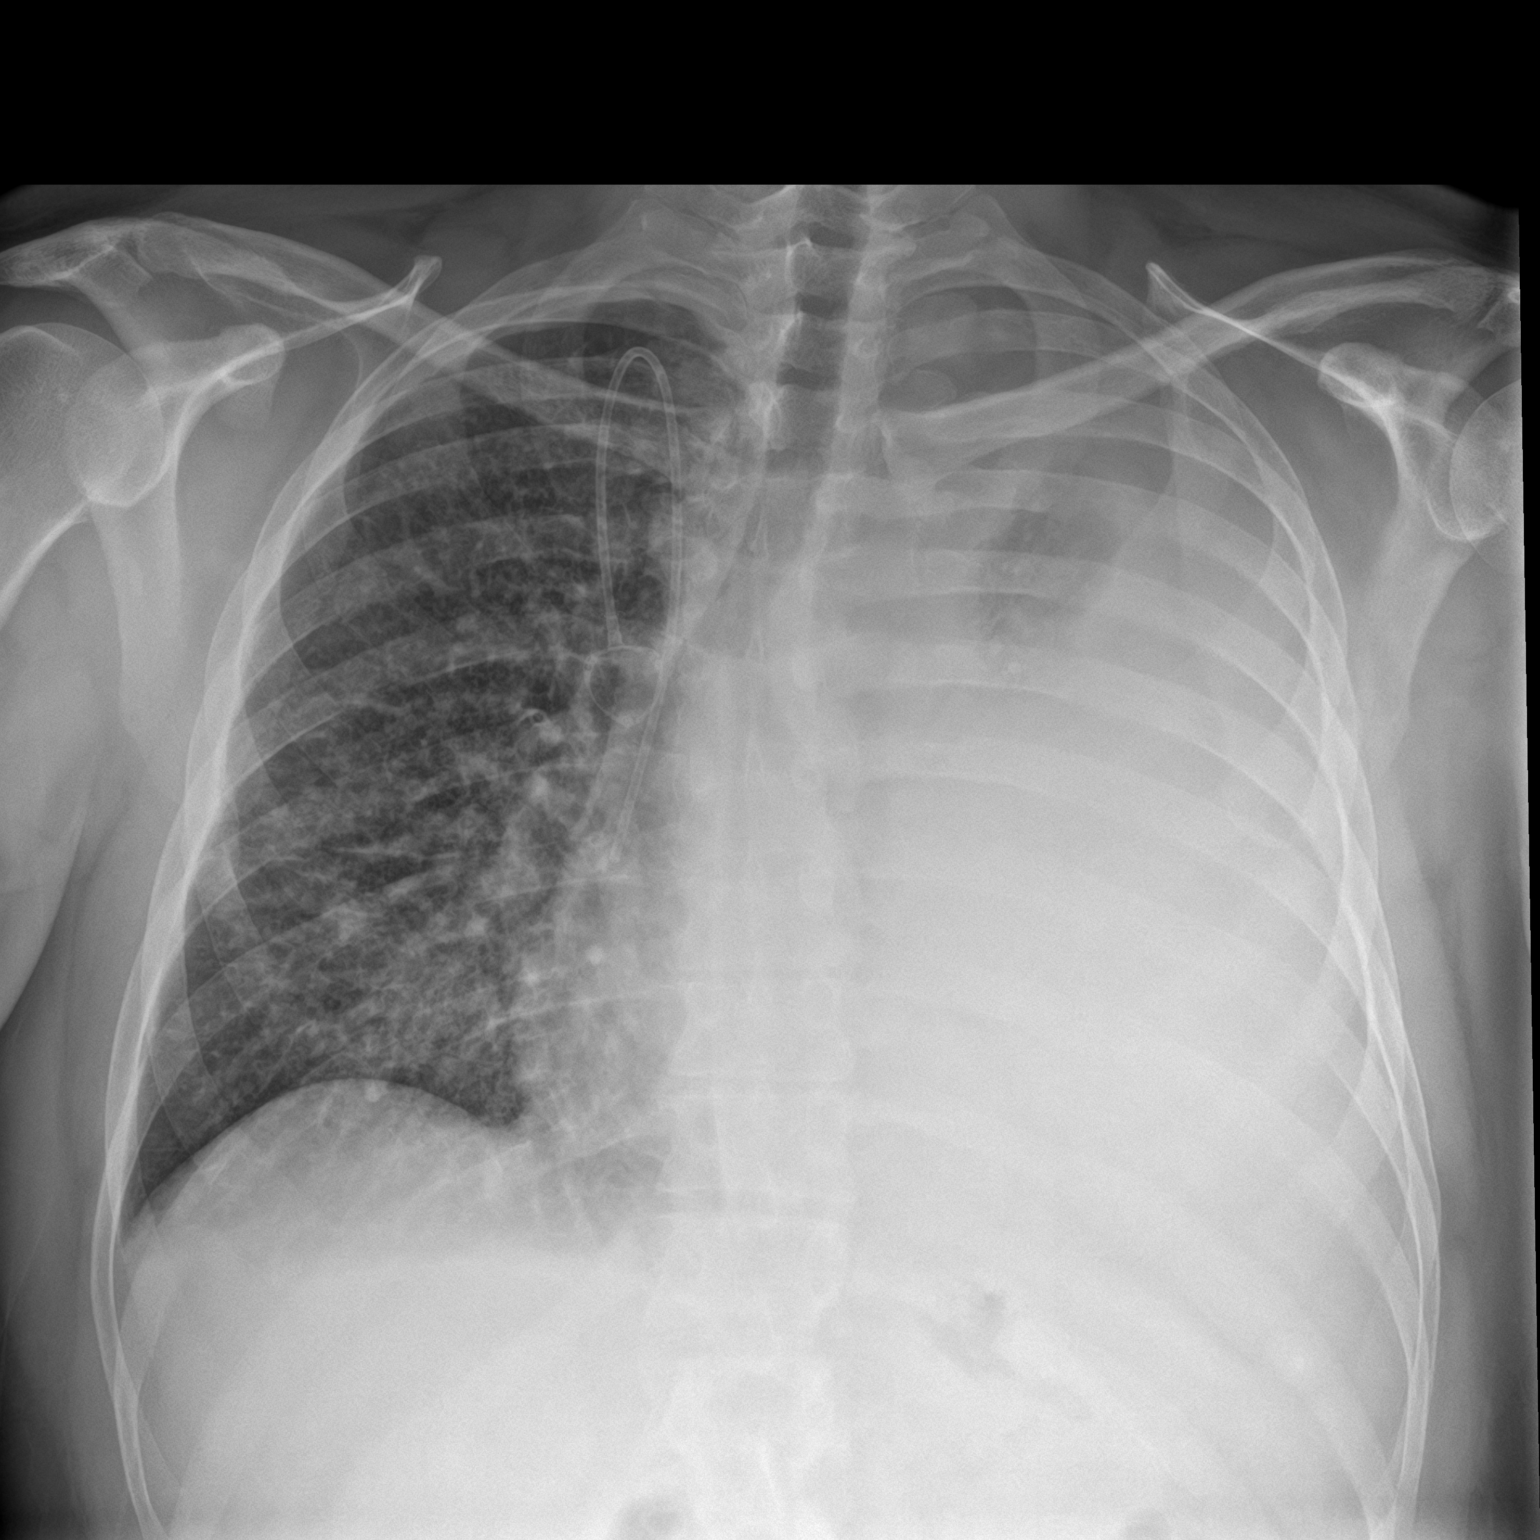

[chest lat]
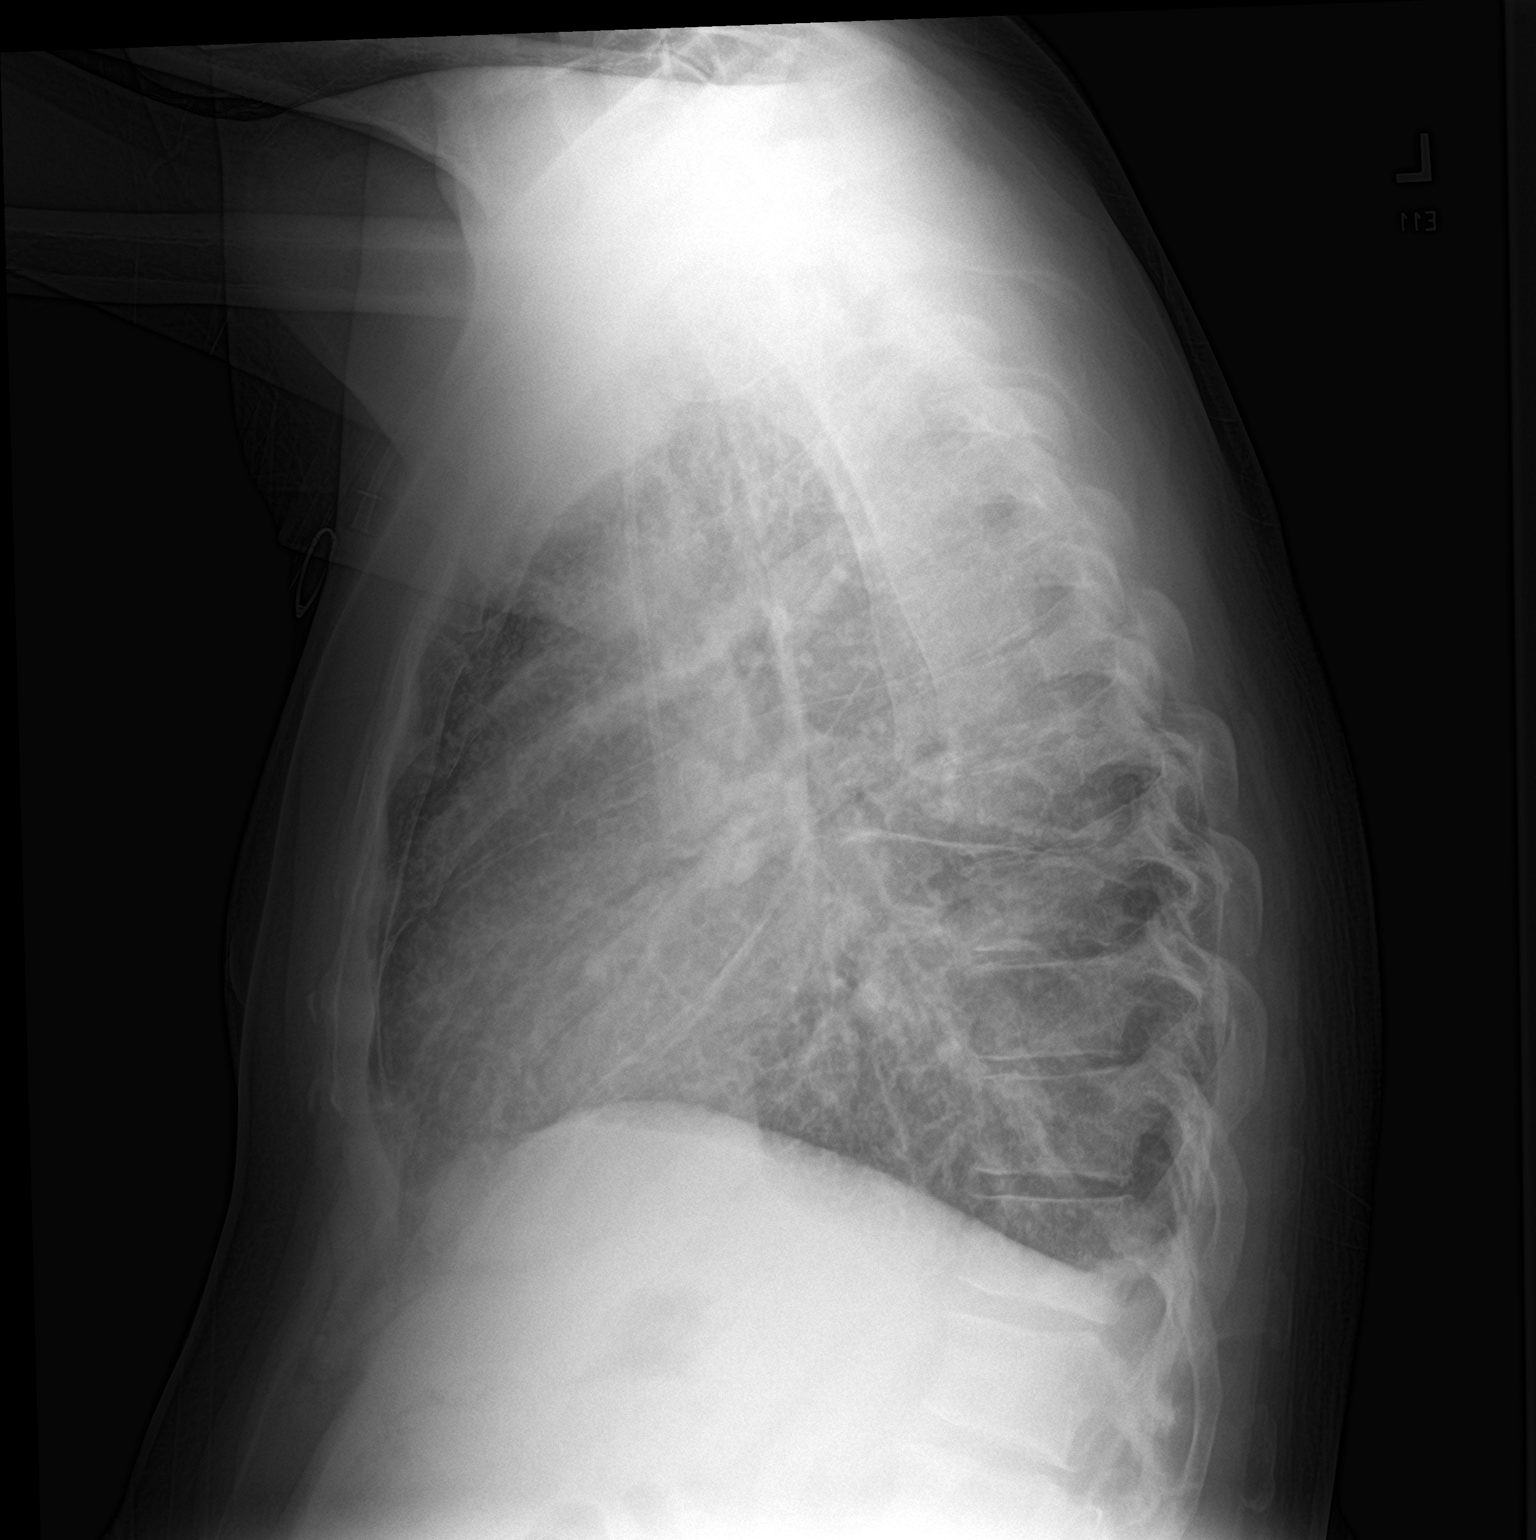

[2 of 2 positions shown; findings below may reference images not displayed]

FINDINGS: Near complete opacification of the LEFT hemithorax is noted with
interval aeration of a small portion of the LEFT UPPER lung.

RIGHT lung interstitial opacities and RIGHT IJ Port-A-Cath with tip
overlying the SUPERIOR cavoatrial junction noted.

There is no evidence of pneumothorax or RIGHT pleural effusion.

No acute bony abnormalities are identified.
IMPRESSION: Little interval change except for aeration of a small portion of the
LEFT UPPER lung.

## 2021-04-03 MED ORDER — HYDROCOD POLST-CPM POLST ER 10-8 MG/5ML PO SUER: 5.0000 mL | Freq: Two times a day (BID) | ORAL | 0 refills | Status: DC | PRN

## 2021-04-03 MED ORDER — HEPARIN SOD (PORK) LOCK FLUSH 100 UNIT/ML IV SOLN
500.0000 [IU] | Freq: Once | INTRAVENOUS | Status: AC
  Administered 2021-04-03: 500 [IU]
  Filled 2021-04-03: qty 5

## 2021-04-03 MED ORDER — SODIUM CHLORIDE 0.9 % IV SOLN
INTRAVENOUS | Status: DC
  Filled 2021-04-03 (×2): qty 250

## 2021-04-03 MED ORDER — SODIUM CHLORIDE 0.9% FLUSH
10.0000 mL | Freq: Once | INTRAVENOUS | Status: AC
Start: 2021-04-03 — End: 2021-04-03
  Administered 2021-04-03: 10 mL via INTRAVENOUS
  Filled 2021-04-03: qty 10

## 2021-04-03 NOTE — Progress Notes (Signed)
Pulse OX Resting without oxygen: 95-96 Resting Pulse:70-80 2-3 minutes walk without oxygen: 85-88 Pulse: 157-160 Patient placed on 2 liters  oxygen per verbal orders of josh broders NP Patient taken for 2 to 3 minutes with oxygen stats improved 95-98% 2-3 minutes walk with oxygen: 95-98 Pulse: 80-88  Per verbal order per josh broders patient will get oxygen orders.

## 2021-04-03 NOTE — Telephone Encounter (Signed)
received a message this morning from pt's wife that patient was generally not feeling well over the weekend and noticed that his oxygen levels would drop to 88 with activity but would go back to 91 at rest  Per Dr. Jacinto Reap- pt will need to come in to be seen in [facility] with chest xray prior. Appt scheduled and pt's wife made aware.

## 2021-04-03 NOTE — Progress Notes (Signed)
[facility]  Telephone:([phone] [phone]  Patient Care Team: Pcp, No as PCP - General [name], RN as Oncology Nurse Navigator   Name of the patient: [name]  [id number]  [birth year]   Date of visit: 04/03/21  Reason for Consult: [name] is a 40 y.o. 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  Patient presents to the [facility] today for evaluation and management of worsening shortness of breath.  Patient reports that his shortness of breath has been more noticeable with exertion over the past several days.  He denies fever or chills.  No chest pain.  No lower extremity edema.  Patient endorses overall fatigue.  He had constipation but this improved after he tried Ensure.  No other symptomatic complaints  Denies any neurologic complaints. Denies recent fevers or illnesses. Denies any easy bleeding or bruising. Reports poor appetite but denies weight loss. Denies chest pain. Denies any nausea, vomiting or diarrhea.  Denies urinary complaints. Patient offers no further specific complaints today.  PAST MEDICAL HISTORY: Past Medical History:  Diagnosis Date   Cancer ([place])    Dyspnea    Heartburn    Pneumonia    Wolff-Parkinson-White (WPW) 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    PAST SURGICAL HISTORY:  Past Surgical History:  Procedure Laterality Date   FRACTURE SURGERY     broke femur when he was 6 years   PORTA CATH INSERTION N/A 03/28/2021   Procedure: PORTA CATH INSERTION;  Surgeon: Katha Cabal, MD;  Location: [facility];  Service: Cardiovascular;  Laterality: N/A;   VIDEO BRONCHOSCOPY WITH ENDOBRONCHIAL NAVIGATION N/A 03/15/2021   Procedure: VIDEO BRONCHOSCOPY WITH ENDOBRONCHIAL  NAVIGATION;  Surgeon: Ottie Glazier, MD;  Location: [facility];  Service: Thoracic;  Laterality: N/A;   VIDEO BRONCHOSCOPY WITH ENDOBRONCHIAL ULTRASOUND N/A 03/15/2021   Procedure: VIDEO BRONCHOSCOPY WITH ENDOBRONCHIAL ULTRASOUND;  Surgeon: Ottie Glazier, MD;  Location: [facility];  Service: Thoracic;  Laterality: N/A;    HEMATOLOGY/ONCOLOGY HISTORY:  Oncology History Overview Note  IMPRESSION: 1. Patchy nodular fat stranding throughout the anterior left upper quadrant peritoneal fat, nonspecific, cannot exclude peritoneal carcinomatosis. Dedicated CT abdomen/pelvis with oral and IV contrast recommended for further evaluation. 2. Dense patchy consolidation replacing much of the left lower lung lobe, appearing masslike in the superior segment left lower lobe, with associated bulging of the left major fissure and associated left lower lobe volume loss. Fine nodularity throughout both lungs with an upper lobe predominance. Asymmetric left upper lobe interlobular septal thickening. These findings are indeterminate, with differential including multilobar pneumonia, sarcoidosis or a neoplastic process. The persistence on radiographs back to 01/20/2021 despite antibiotic therapy make sarcoidosis or a neoplastic process more likely. Pulmonology consultation suggested for consideration of bronchoscopic evaluation. 3. Small dependent left pleural effusion. 4. Mild mediastinal lymphadenopathy, nonspecific. 5. Subacute healing lateral right sixth rib fracture.   DIAGNOSIS:  A. LUNG, LEFT LOWER LOBE; ENB-ASSISTED BIOPSY:  - NON-SMALL CELL CARCINOMA, FAVOR ADENOCARCINOMA.  - FOREIGN MATERIAL SUGGESTIVE OF ASPIRATION.   There is sufficient material for limited ancillary studi   Cancer of lower lobe of left lung ([place])  03/23/2021 Initial Diagnosis   Cancer of lower lobe of left lung ([place])   03/29/2021 Cancer Staging   Staging form: Lung, AJCC 8th Edition - Clinical: Stage IVB (cT3,  cN3, pM1c) -  Signed by [name], MD on 03/29/2021    03/30/2021 -  Chemotherapy    Patient is on Treatment Plan: LUNG NSCLC PEMETREXED (ALIMTA) / CARBOPLATIN Q21D X 1 CYCLES         ALLERGIES:  is allergic to codeine.  MEDICATIONS:  Current Outpatient Medications  Medication Sig Dispense Refill   chlorpheniramine-HYDROcodone (TUSSIONEX) 10-8 MG/5ML SUER Take 5 mLs by mouth every 12 (twelve) hours as needed for cough. 140 mL 0   dexamethasone (DECADRON) 2 MG tablet Take 1 tablet (2 mg total) by mouth 2 (two) times daily for 10 days. 20 tablet 0   folic acid (FOLVITE) 1 MG tablet Take 1 tablet (1 mg total) by mouth daily. 90 tablet 1   HYDROcodone-acetaminophen (NORCO/VICODIN) 5-325 MG tablet Take 1 tablet by mouth every 6 (six) hours as needed for moderate pain. 90 tablet 0   lidocaine-prilocaine (EMLA) cream Apply 1 application topically as needed. 30 g 0   LORazepam (ATIVAN) 0.5 MG tablet Take 1 tablet (0.5 mg total) by mouth every 8 (eight) hours. 30 tablet 0   metoprolol succinate (TOPROL XL) 25 MG 24 hr tablet Take 1 tablet (25 mg total) by mouth daily. 30 tablet 1   ondansetron (ZOFRAN) 8 MG tablet One pill every 8 hours as needed for nausea/vomitting. 40 tablet 1   prochlorperazine (COMPAZINE) 10 MG tablet Take 1 tablet (10 mg total) by mouth every 6 (six) hours as needed for nausea or vomiting. 40 tablet 1   sertraline (ZOLOFT) 50 MG tablet Take 0.5 tablets (25 mg total) by mouth daily. 30 tablet 0   No current facility-administered medications for this visit.   Facility-Administered Medications Ordered in Other Visits  Medication Dose Route Frequency Provider Last Rate Last Admin   0.9 %  sodium chloride infusion   Intravenous Continuous Wendal Wilkie, Kirt Boys, NP 500 mL/hr at 04/03/21 1156 New Bag at 04/03/21 1156   heparin lock flush 100 unit/mL  500 Units Intracatheter Once [name], MD        VITAL SIGNS: BP 119/81   Pulse 78   Temp (!) 97 F (36.1 C)  (Tympanic)   Resp 20   SpO2 97%  There were no vitals filed for this visit.  Estimated body mass index is 29.59 kg/m as calculated from the following:   Height as of 03/28/21: 5\' 10"  (1.778 m).   Weight as of 03/30/21: 206 lb 4 oz (93.6 kg).  LABS: CBC:    Component Value Date/Time   WBC 18.6 (H) 04/03/2021 1043   HGB 16.0 04/03/2021 1043   HCT 46.1 04/03/2021 1043   PLT 306 04/03/2021 1043   MCV 89.2 04/03/2021 1043   NEUTROABS 16.8 (H) 04/03/2021 1043   LYMPHSABS 1.4 04/03/2021 1043   MONOABS 0.1 04/03/2021 1043   EOSABS 0.3 04/03/2021 1043   BASOSABS 0.0 04/03/2021 1043   Comprehensive Metabolic Panel:    Component Value Date/Time   NA 129 (L) 04/03/2021 1043   K 3.8 04/03/2021 1043   CL 94 (L) 04/03/2021 1043   CO2 27 04/03/2021 1043   BUN 17 04/03/2021 1043   CREATININE 0.67 04/03/2021 1043   GLUCOSE 96 04/03/2021 1043   CALCIUM 8.2 (L) 04/03/2021 1043   AST 55 (H) 04/03/2021 1043   ALT 143 (H) 04/03/2021 1043   ALKPHOS 394 (H) 04/03/2021 1043   BILITOT 0.9 04/03/2021 1043   PROT 6.2 (L) 04/03/2021 1043   ALBUMIN 2.8 (L) 04/03/2021 1043  RADIOGRAPHIC STUDIES: DG Chest 2 View  Result Date: 04/03/2021 CLINICAL DATA:  Hypoxia, pleural effusion and lung cancer. EXAM: CHEST - 2 VIEW COMPARISON:  03/28/2021 FINDINGS: Near complete opacification of the LEFT hemithorax is noted with interval aeration of a small portion of the LEFT UPPER lung. RIGHT lung interstitial opacities and RIGHT IJ Port-A-Cath with tip overlying the SUPERIOR cavoatrial junction noted. There is no evidence of pneumothorax or RIGHT pleural effusion. No acute bony abnormalities are identified. IMPRESSION: Little interval change except for aeration of a small portion of the LEFT UPPER lung. Electronically Signed   By: Margarette Canada M.D.   On: 04/03/2021 10:33   MR BRAIN W WO CONTRAST  Result Date: 03/28/2021 CLINICAL DATA:  Staging for lung mass EXAM: MRI HEAD WITHOUT AND WITH CONTRAST TECHNIQUE:  Multiplanar, multiecho pulse sequences of the brain and surrounding structures were obtained without and with intravenous contrast. CONTRAST:  30mL GADAVIST GADOBUTROL 1 MMOL/ML IV SOLN COMPARISON:  None. FINDINGS: Brain: There are numerous contrast-enhancing lesions throughout the brain. The largest lesions are in the right frontal and parietal lobes. The largest right parietal lesion measures 11 mm with moderate surrounding edema. The largest right frontal lesion measures 10 mm with mild surrounding edema. There are greater than 20 lesions in total. No midline shift or other mass effect. No acute infarct. No acute hemorrhage. Ventricular sizes are normal. Vascular: Normal flow voids. Skull and upper cervical spine: Normal marrow signal. Sinuses/Orbits: Negative. Other: None. IMPRESSION: Numerous scattered intraparenchymal metastases. The largest lesions are in the right frontal and parietal lobes with moderate surrounding edema. No midline shift or other mass effect. Electronically Signed   By: Ulyses Jarred M.D.   On: 03/28/2021 22:27   PERIPHERAL VASCULAR CATHETERIZATION  Result Date: 03/29/2021 See surgical note for result.  NM PET Image Initial (PI) Skull Base To Thigh  Result Date: 03/28/2021 CLINICAL DATA:  Initial treatment strategy for non-small cell lung carcinoma. EXAM: NUCLEAR MEDICINE PET SKULL BASE TO THIGH TECHNIQUE: 10.7 mCi F-18 FDG was injected intravenously. Full-ring PET imaging was performed from the skull base to thigh after the radiotracer. CT data was obtained and used for attenuation correction and anatomic localization. Fasting blood glucose: 90 mg/dl COMPARISON:  03/15/2021 FINDINGS: Mediastinal blood-pool activity (background): SUV max = 2.6 Liver activity (reference): SUV max = N/A NECK: 6 mm left supraclavicular lymph node on image 19/5 is hypermetabolic, with SUV max of 3.6. Incidental CT findings:  None. CHEST: Left lung collapse is seen with a large hypermetabolic masslike  opacity in the perihilar region and left lower lobe, which measures approximately 7.0 x 5.8 cm on image 101/3 and has SUV max of 17.5. Hypermetabolic activity in the collapsed left upper lobe is likely secondary to atelectasis. Large left pleural effusion causes mediastinal shift to the right and shows pleural base areas mild FDG uptake, highly suspicious for malignant pleural effusion. A 10 mm subcarinal lymph node shows FDG uptake with SUV max of 3.9. A 10 mm right paratracheal lymph node shows FDG uptake with SUV max of 3.9. Incidental CT findings:  None. ABDOMEN/PELVIS: No abnormal hypermetabolic activity within the liver, pancreas, adrenal glands, or spleen. 1.7 cm hypermetabolic soft tissue density in the splenic hilum has SUV max of 6.2, and may represent a hypermetabolic lymph node or peritoneal implant. Ill-defined soft tissue density in the left upper quadrant omental fat is hypermetabolic with SUV max of 4.5, consistent with omental carcinomatosis. Hypermetabolic activity is seen involving the peritoneum in dependent pelvis, also  suspicious for peritoneal carcinomatosis. No other hypermetabolic soft tissue masses or lymphadenopathy identified. Incidental CT findings:  None. SKELETON: Numerous hypermetabolic bone metastases are seen in the bilateral scapulae, bilateral ribs, thoracic and lumbosacral spine, and pelvis, most of which are lytic in appearance. Incidental CT findings:  None. IMPRESSION: Left lung collapse with 7 cm hypermetabolic masslike opacity centered in the left lower lobe and involving the left hilum, consistent with primary bronchogenic carcinoma. Large malignant left pleural effusion. Hypermetabolic lymphadenopathy in mediastinum and left supraclavicular region. Peritoneal/omental carcinomatosis. Diffuse bone metastases. Electronically Signed   By: Marlaine Hind M.D.   On: 03/28/2021 09:59   DG Chest Port 1 View  Result Date: 03/28/2021 CLINICAL DATA:  Pleural effusion left, s/p  thora EXAM: PORTABLE CHEST - 1 VIEW COMPARISON:  03/27/2021 PET-CT FINDINGS: No pneumothorax. Near complete opacification of left hemithorax with central air bronchograms. Heart size difficult to assess due to adjacent opacities. On the right, diffuse pulmonary vascular congestion. Lytic lesions in bilateral ribs are identified, with probable pathologic fracture involving the right sixth rib. IMPRESSION: 1. No pneumothorax post left thoracentesis, with persistent opacification of the left hemithorax. Electronically Signed   By: Lucrezia Europe M.D.   On: 03/28/2021 14:11   DG Chest Port 1 View  Result Date: 03/15/2021 CLINICAL DATA:  Post bronchoscopy on the left EXAM: PORTABLE CHEST 1 VIEW COMPARISON:  CT chest 03/15/2021 FINDINGS: Mild to moderate left pleural effusion. Left lower lobe consolidation. No pneumothorax. Mild right lower lobe airspace disease appears new from the CT today. There is mild vascular congestion which may be due to mild fluid overload IMPRESSION: No pneumothorax post bronchoscopy Persistent left pleural effusion and left lower lobe consolidation/mass Progression of right lower lobe airspace disease Pulmonary vascular  congestion.  Question fluid overload Electronically Signed   By: Franchot Gallo M.D.   On: 03/15/2021 15:55   DG C-Arm 1-60 Min-No Report  Result Date: 03/15/2021 Fluoroscopy was utilized by the requesting physician.  No radiographic interpretation.   CT Super D Chest Wo Contrast  Result Date: 03/15/2021 CLINICAL DATA:  Recent pneumonia.  Cough. EXAM: CT CHEST WITHOUT CONTRAST TECHNIQUE: Multidetector CT imaging of the chest was performed using thin slice collimation for electromagnetic bronchoscopy planning purposes, without intravenous contrast. COMPARISON:  02/23/2021 FINDINGS: Cardiovascular: The heart size is normal. No substantial pericardial effusion. No thoracic aortic aneurysm. Mediastinum/Nodes: 10 mm short axis right paratracheal node evident. 10 mm short axis  subcarinal lymph node associated. No definite right hilar lymphadenopathy on this noncontrast study. Soft tissue fullness noted in the left hilum although assessment limited by lack of intravenous contrast material. The esophagus has normal imaging features. There is no axillary lymphadenopathy. Lungs/Pleura: Diffuse micro nodularity again noted bilaterally with an upper lung predominance. The collapse/consolidative change in the left lower lobe is similar to prior with 6.1 x 4.0 cm measured masslike consolidative opacity stable at 6.1 x 4.5 cm today. Small left pleural effusion evident. Upper Abdomen: Nodular soft tissue attenuation in the left upper quadrant/omental region is again noted. Musculoskeletal: 11 mm lucency is identified in the T10 vertebral body (43/2) IMPRESSION: 1. No substantial interval change in the dense consolidative opacity involving the left lower lobe with associated small left pleural effusion. 2. Bilateral relatively symmetric micro nodularity demonstrates an upper lobe predominance, unchanged. This may be infectious/inflammatory in etiology. 3. Similar appearance of the ill-defined nodular soft tissue involving the anterior left upper quadrant, likely omental. Given that metastatic disease could certainly have this appearance, dedicated abdomen/pelvis  CT with oral and intravenous contrast recommended to further evaluate. Electronically Signed   By: Misty Stanley M.D.   On: 03/15/2021 12:48   US THORACENTESIS ASP PLEURAL SPACE W/IMG GUIDE  Result Date: 03/28/2021 INDICATION: Non-small cell lung carcinoma, metastatic.  Left pleural effusion. EXAM: ULTRASOUND GUIDED LEFT THORACENTESIS MEDICATIONS: Lidocaine 1% subcutaneous COMPLICATIONS: None immediate.  No pneumothorax on follow-up radiograph. PROCEDURE: An ultrasound guided thoracentesis was thoroughly discussed with the patient and questions answered. The benefits, risks, alternatives and complications were also discussed. The patient  understands and wishes to proceed with the procedure. Written consent was obtained. Ultrasound was performed to localize and mark an adequate pocket of fluid in the left chest. The area was then prepped and draped in the normal sterile fashion. 1% Lidocaine was used for local anesthesia. Under ultrasound guidance a 6 Fr Safe-T-Centesis catheter was introduced. Thoracentesis was performed. The catheter was removed and a dressing applied. FINDINGS: A total of approximately 1.2 L of clear yellow fluid was removed. IMPRESSION: Successful ultrasound guided left thoracentesis yielding 1.2 L of pleural fluid. Electronically Signed   By: Lucrezia Europe M.D.   On: 03/28/2021 14:38    PERFORMANCE STATUS (ECOG) : 1 - Symptomatic but completely ambulatory  Review of Systems Unless otherwise noted, a complete review of systems is negative.  Physical Exam General: NAD Cardiovascular: regular rate and rhythm Pulmonary: Limited breath sounds left side anterior/posterior fields Abdomen: soft, nontender, + bowel sounds GU: no suprapubic tenderness Extremities: no edema, no joint deformities Skin: no rashes Neurological: Weakness but otherwise nonfocal  Assessment and Plan- Patient is a 40 y.o. male tage IV non-small cell lung cancer widely metastatic to bone/brain/omental and peritoneal carcinomatosis/malignant pleural effusion on treatment with systemic chemotherapy who presents to [facility] for evaluation of shortness of breath.   Hypoxia - patient SaO2 85% and HR increased to 150s with ambulation. On 2L O2, his SaO2 was high 90s and HR stable in 80s.  Chest x-ray continues to show complete opacification of left chest but slightly improved aeration in the upper lobe.  Discussed with Dr. Rogue Bussing who also examined patient.  Will send for repeat therapeutic thoracentesis.  Low clinical suspicion for PE but if no improvement following thoracentesis, will consider CTA.  We will send Rx for home O2 starting at 2 L as needed.   Continue dexamethasone.  Patient has albuterol inhaler but will limit use given propensity for tachycardia.  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  We will give 1L NS today.   Poor oral intake -referral to nutrition  Case and plan discussed with Dr. Rogue Bussing   Patient expressed understanding and was in agreement with this plan. He also understands that He can call clinic at any time with any questions, concerns, or complaints.   Thank you for allowing me to participate in the care of this very pleasant patient.   Time Total: 25 minutes  Visit consisted of counseling and education dealing with the complex and emotionally intense issues of symptom management and palliative care in the setting of serious and potentially life-threatening illness.Greater than 50%  of this time was spent counseling and coordinating care related to the above assessment and plan.  Signed by: Altha Harm, PhD, NP-C

## 2021-04-04 ENCOUNTER — Other Ambulatory Visit: Payer: Self-pay | Admitting: Student

## 2021-04-04 ENCOUNTER — Ambulatory Visit
Admission: RE | Admit: 2021-04-04 | Discharge: 2021-04-04 | Disposition: A | Discharge status: Home / Self Care | Payer: Managed Care, Other (non HMO) | Source: Ambulatory Visit | Attending: Student | Admitting: Student

## 2021-04-04 ENCOUNTER — Ambulatory Visit
Admission: RE | Admit: 2021-04-04 | Discharge: 2021-04-04 | Disposition: A | Discharge status: Home / Self Care | Payer: Managed Care, Other (non HMO) | Source: Ambulatory Visit | Attending: Hospice and Palliative Medicine | Admitting: Hospice and Palliative Medicine

## 2021-04-04 ENCOUNTER — Inpatient Hospital Stay (HOSPITAL_BASED_OUTPATIENT_CLINIC_OR_DEPARTMENT_OTHER): Payer: Managed Care, Other (non HMO) | Admitting: Hospice and Palliative Medicine

## 2021-04-04 ENCOUNTER — Encounter: Payer: Self-pay | Admitting: Hospice and Palliative Medicine

## 2021-04-04 DIAGNOSIS — J90 Pleural effusion, not elsewhere classified: Secondary | ICD-10-CM

## 2021-04-04 DIAGNOSIS — C3492 Malignant neoplasm of unspecified part of left bronchus or lung: Secondary | ICD-10-CM

## 2021-04-04 DIAGNOSIS — Z9889 Other specified postprocedural states: Secondary | ICD-10-CM

## 2021-04-04 IMAGING — DX DG CHEST 1V PORT
1 series · 1 of 1 positions shown · non-contrast
Comparison: [DATE]

CLINICAL DATA: 39-year-old male with a history of left-sided
pleural effusion

EXAM:
PORTABLE CHEST 1 VIEW

[chest ap]
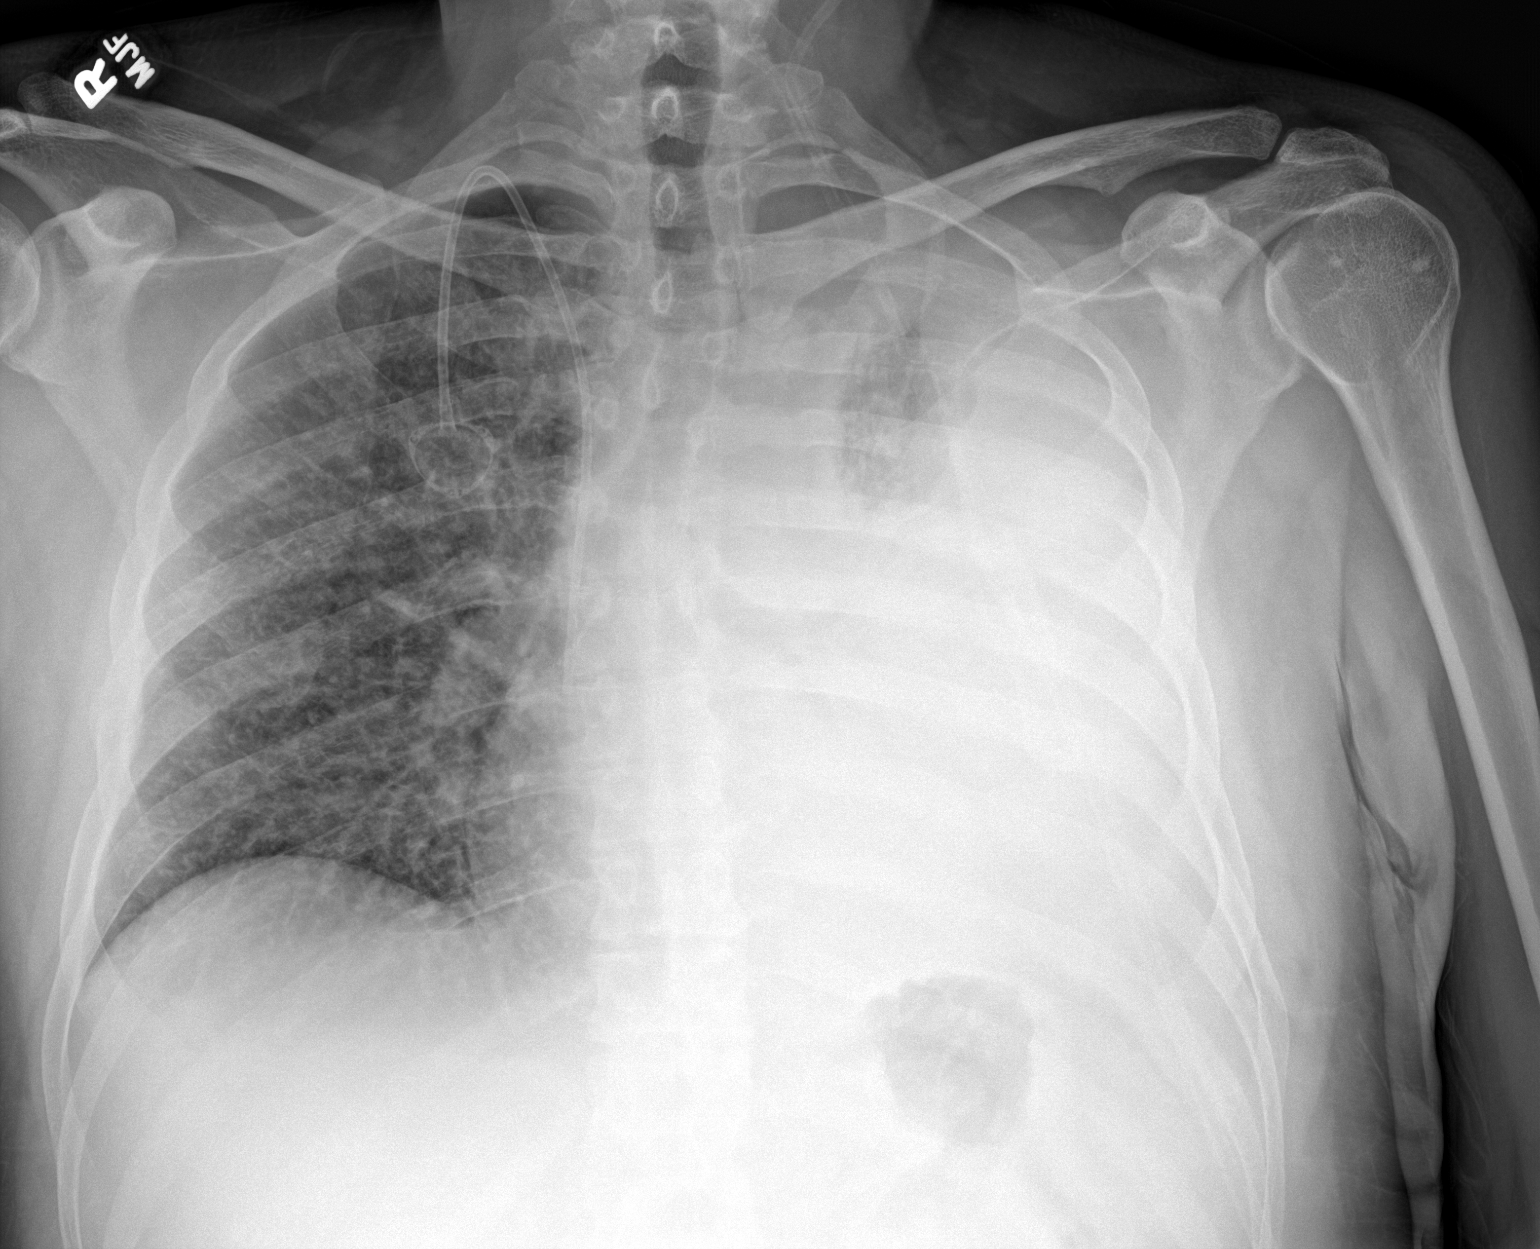

[1 of 1 positions shown; findings below may reference images not displayed]

FINDINGS: Cardiomediastinal silhouette likely unchanged and partially
obstructed by overlying lung/pleural disease.

Persistent opacity of the left chest with partially aerated left
upper lung. No pneumothorax. No air-fluid level.

Unchanged appearance of the right lung with mild interlobular septal
thickening/reticular opacities. No new confluent airspace disease.

Unchanged right IJ port catheter.
IMPRESSION: No complicating features status post left thoracentesis, with
persisting pleural fluid and lung consolidation/atelectasis.

## 2021-04-04 NOTE — Procedures (Signed)
PROCEDURE SUMMARY:  Successful US guided left thoracentesis. Yielded 1.3 L of amber-colored fluid. Pt tolerated procedure well. No immediate complications.  CXR ordered; no post-procedure pneumothorax identified.   EBL < 2 mL  Theresa Duty, NP 04/04/2021 3:57 PM

## 2021-04-04 NOTE — Progress Notes (Signed)
Called pt and reviewed name and date of birth.  RN reviewed medication list.  Pt denies any concerns today and verbalized understanding that Billey Chang, NP would be reaching out around 10 am this morning for mychart visit.  Pt verbalized understanding.

## 2021-04-04 NOTE — Progress Notes (Signed)
Virtual Visit via Telephone Note  I connected with [name] on 04/04/21 at 10:00 AM EDT by telephone and verified that I am speaking with the correct person using two identifiers.  Location: Patient: Home Provider: Clinic   I discussed the limitations, risks, security and privacy concerns of performing an evaluation and management service by telephone and the availability of in person appointments. I also discussed with the patient that there may be a patient responsible charge related to this service. The patient expressed understanding and agreed to proceed.   History of Present Illness: [name] is a 40 y.o. 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  Patient seen yesterday in [facility] was found to be hypoxic and started on O2.  He is pending thoracentesis today.   Observations/Objective: I called and spoke with patient by phone.  He reports that he is feeling significantly better today.  He is wearing O2 and denies shortness of breath at present.  He is monitoring SaO2 at home.  He denies any significant changes or concerns today.  No fever or chills.  Patient is pending thoracentesis at 2:30 today and then will have follow-up with Dr. Rogue Bussing tomorrow.  Assessment and Plan: Stage IV non-small cell lung cancer -on systemic chemo with carboplatin/pemetrexed.    Overall, patient feels better today.  Continue home O2 as needed. He has follow-up tomorrow with Dr. Rogue Bussing.  Recurrent malignant pleural effusion -pending thoracentesis this afternoon  Follow Up Instructions: RTC tomorrow as previously scheduled   I discussed the assessment and treatment plan with the patient. The patient was provided an opportunity to ask questions and all were answered. The  patient agreed with the plan and demonstrated an understanding of the instructions.   The patient was advised to call back or seek an in-person evaluation if the symptoms worsen or if the condition fails to improve as anticipated.  I provided 5 minutes of non-face-to-face time during this encounter.   Irean Hong, NP

## 2021-04-05 ENCOUNTER — Inpatient Hospital Stay: Payer: Managed Care, Other (non HMO)

## 2021-04-05 ENCOUNTER — Inpatient Hospital Stay (HOSPITAL_BASED_OUTPATIENT_CLINIC_OR_DEPARTMENT_OTHER): Payer: Managed Care, Other (non HMO) | Admitting: Internal Medicine

## 2021-04-05 ENCOUNTER — Other Ambulatory Visit: Payer: Self-pay

## 2021-04-05 DIAGNOSIS — Z5111 Encounter for antineoplastic chemotherapy: Secondary | ICD-10-CM | POA: Diagnosis not present

## 2021-04-05 DIAGNOSIS — C3492 Malignant neoplasm of unspecified part of left bronchus or lung: Secondary | ICD-10-CM

## 2021-04-05 DIAGNOSIS — C3432 Malignant neoplasm of lower lobe, left bronchus or lung: Secondary | ICD-10-CM | POA: Diagnosis not present

## 2021-04-05 LAB — COMPREHENSIVE METABOLIC PANEL
ALT: 107 U/L — ABNORMAL HIGH (ref 0–44)
AST: 41 U/L (ref 15–41)
Albumin: 2.7 g/dL — ABNORMAL LOW (ref 3.5–5.0)
Alkaline Phosphatase: 335 U/L — ABNORMAL HIGH (ref 38–126)
Anion gap: 7 (ref 5–15)
BUN: 15 mg/dL (ref 6–20)
CO2: 26 mmol/L (ref 22–32)
Calcium: 8.2 mg/dL — ABNORMAL LOW (ref 8.9–10.3)
Chloride: 99 mmol/L (ref 98–111)
Creatinine, Ser: 0.63 mg/dL (ref 0.61–1.24)
GFR, Estimated: >60 mL/min (ref >60)
Glucose, Bld: 108 mg/dL — ABNORMAL HIGH (ref 70–99)
Potassium: 3.7 mmol/L (ref 3.5–5.1)
Sodium: 132 mmol/L — ABNORMAL LOW (ref 135–145)
Total Bilirubin: 0.8 mg/dL (ref 0.3–1.2)
Total Protein: 6.1 g/dL — ABNORMAL LOW (ref 6.5–8.1)

## 2021-04-05 LAB — CBC WITH DIFFERENTIAL/PLATELET
Abs Immature Granulocytes: 0.06 10*3/uL (ref 0.00–0.07)
Basophils Absolute: 0 10*3/uL (ref 0.0–0.1)
Basophils Relative: 0 %
Eosinophils Absolute: 0.5 10*3/uL (ref 0.0–0.5)
Eosinophils Relative: 5 %
HCT: 43.1 % (ref 39.0–52.0)
Hemoglobin: 15.1 g/dL (ref 13.0–17.0)
Immature Granulocytes: 1 %
Lymphocytes Relative: 25 %
Lymphs Abs: 2.8 10*3/uL (ref 0.7–4.0)
MCH: 30.9 pg (ref 26.0–34.0)
MCHC: 35 g/dL (ref 30.0–36.0)
MCV: 88.3 fL (ref 80.0–100.0)
Monocytes Absolute: 0.3 10*3/uL (ref 0.1–1.0)
Monocytes Relative: 2 %
Neutro Abs: 7.4 10*3/uL (ref 1.7–7.7)
Neutrophils Relative %: 67 %
Platelets: 201 10*3/uL (ref 150–400)
RBC: 4.88 MIL/uL (ref 4.22–5.81)
RDW: 11.3 % — ABNORMAL LOW (ref 11.5–15.5)
WBC: 11 10*3/uL — ABNORMAL HIGH (ref 4.0–10.5)
nRBC: 0 % (ref 0.0–0.2)

## 2021-04-05 IMAGING — US US THORACENTESIS ASP PLEURAL SPACE W/IMG GUIDE
2 series · 6 of 6 positions shown · non-contrast
Comparison: none

INDICATION: Patient with history of non-small cell lung cancer and recurrent
left pleural effusion presents today for therapeutic thoracentesis.

[Series 1: us thoracentesis asp pleural space w/img guide · 0.26mm/px · 3 of 3 slices shown]
[im 1/3]
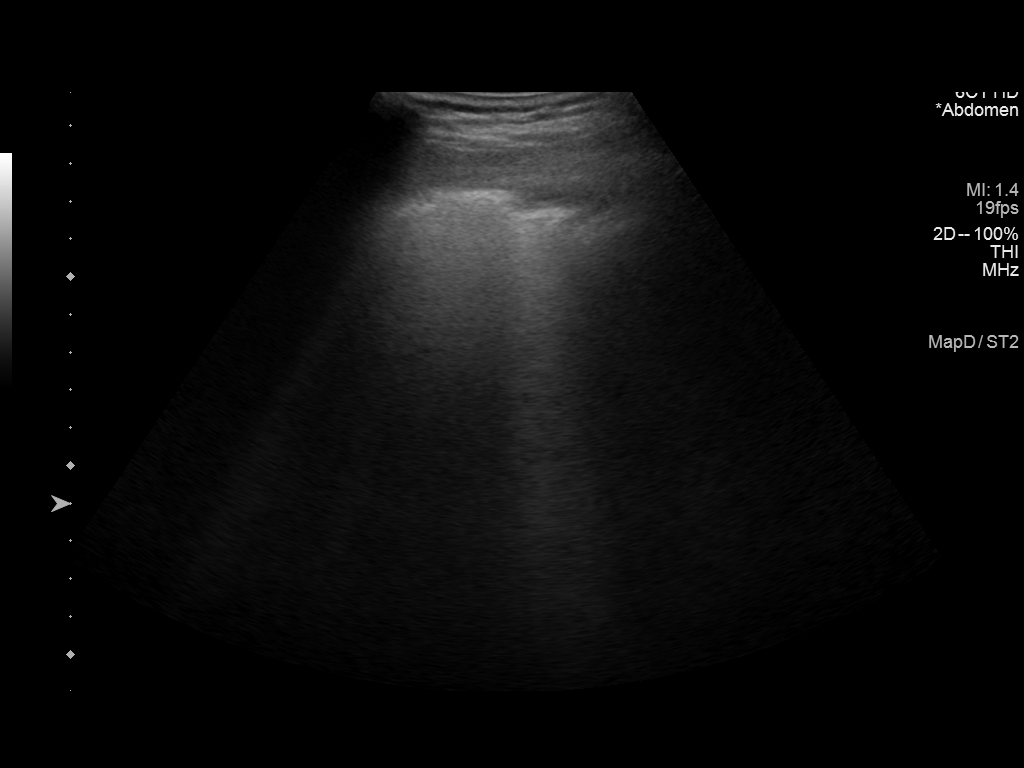
[im 2/3]
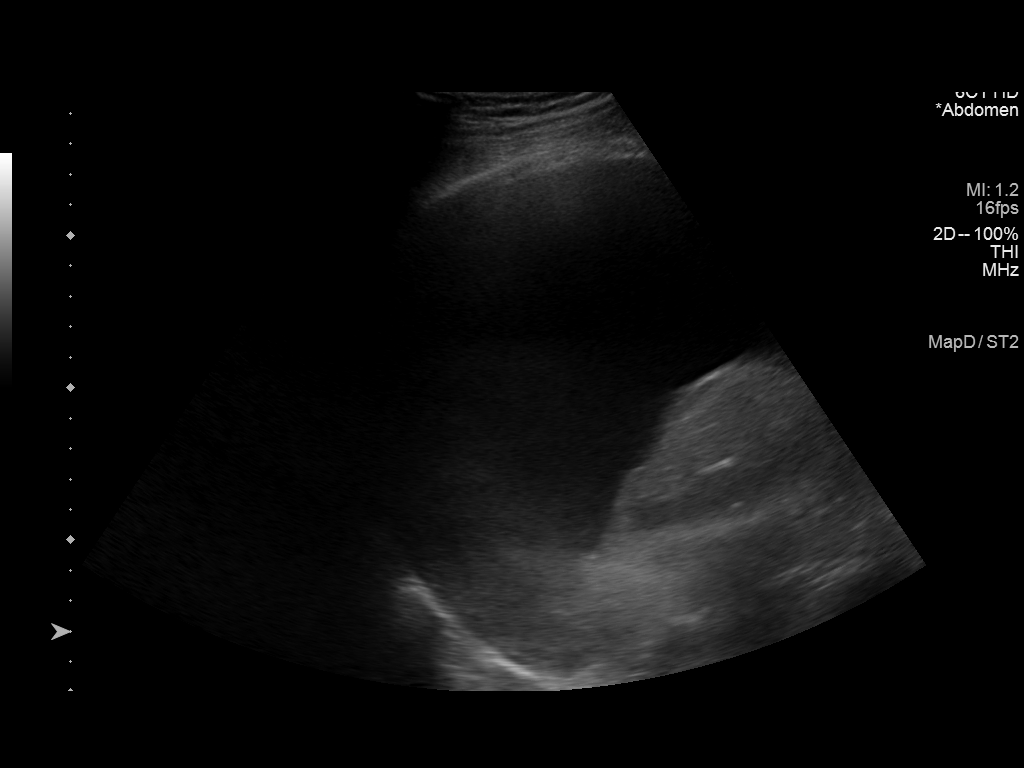
[im 3/3]
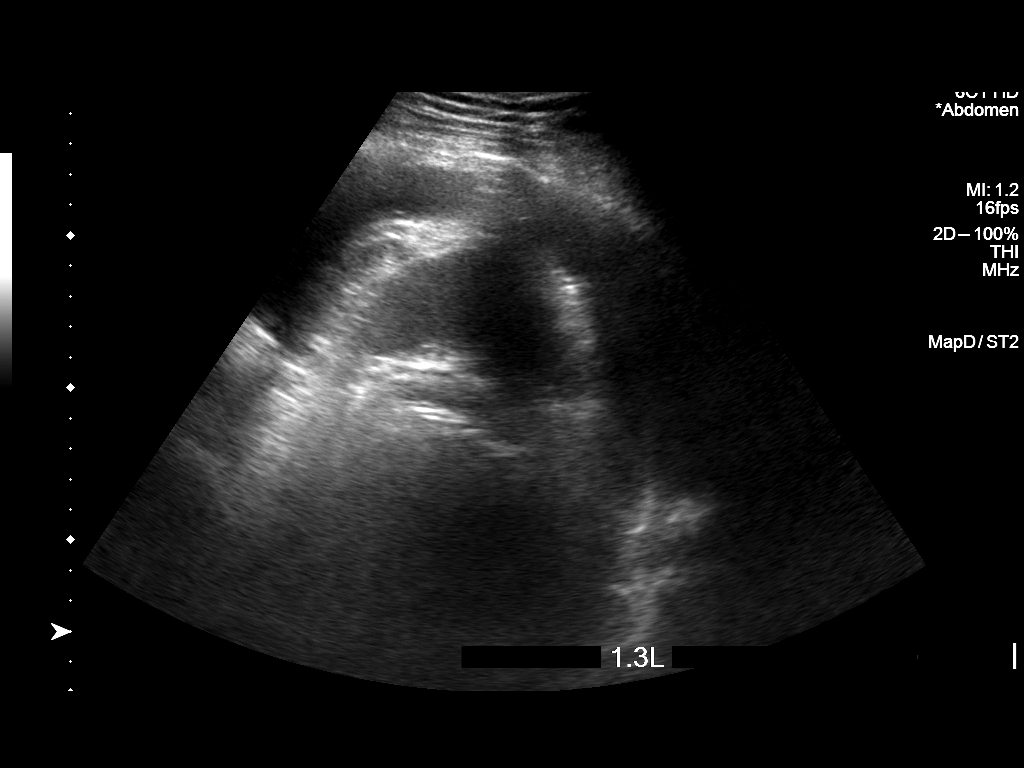

[Series 1: us thoracentesis asp pleural s · 3 acquisitions, 3 frames shown]
[im 1/3]
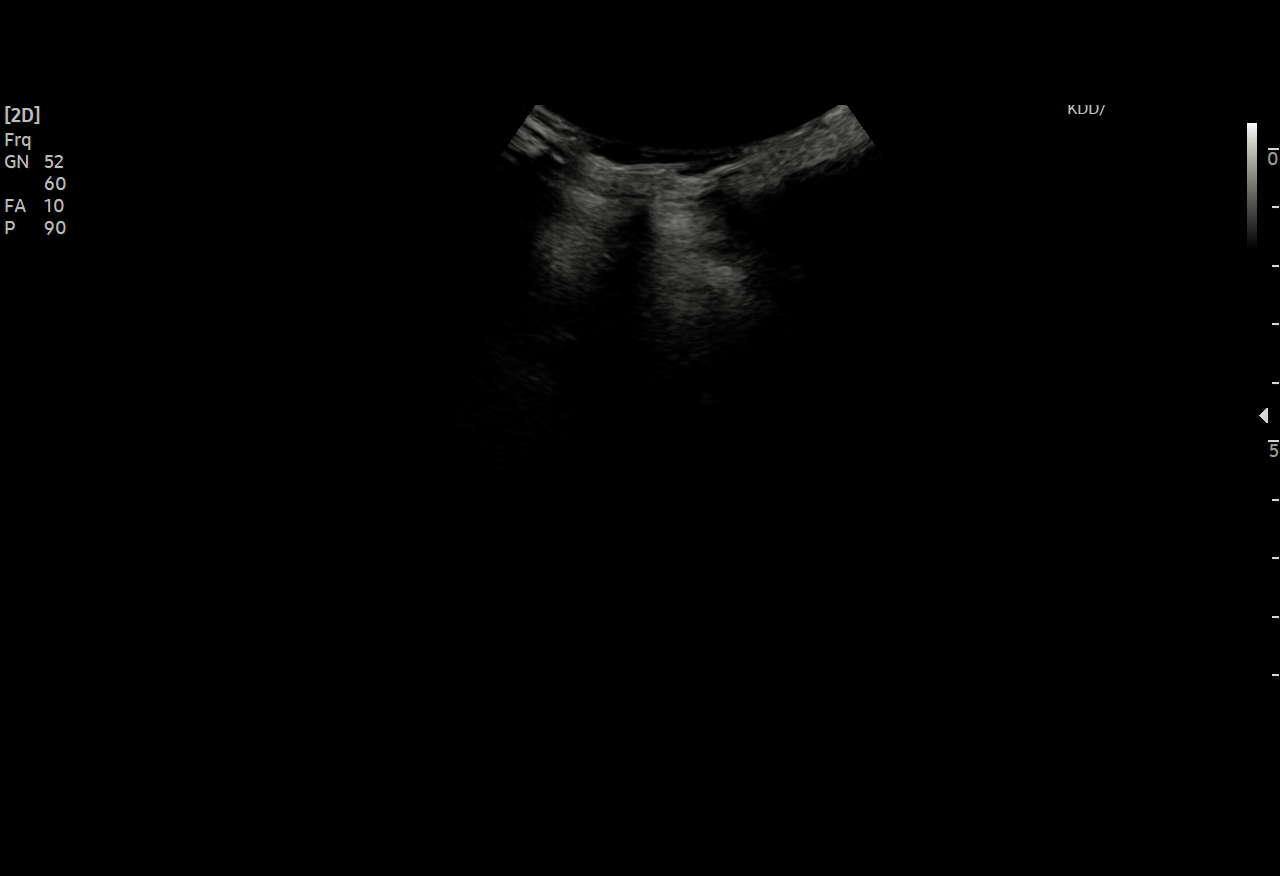
[im 2/3]
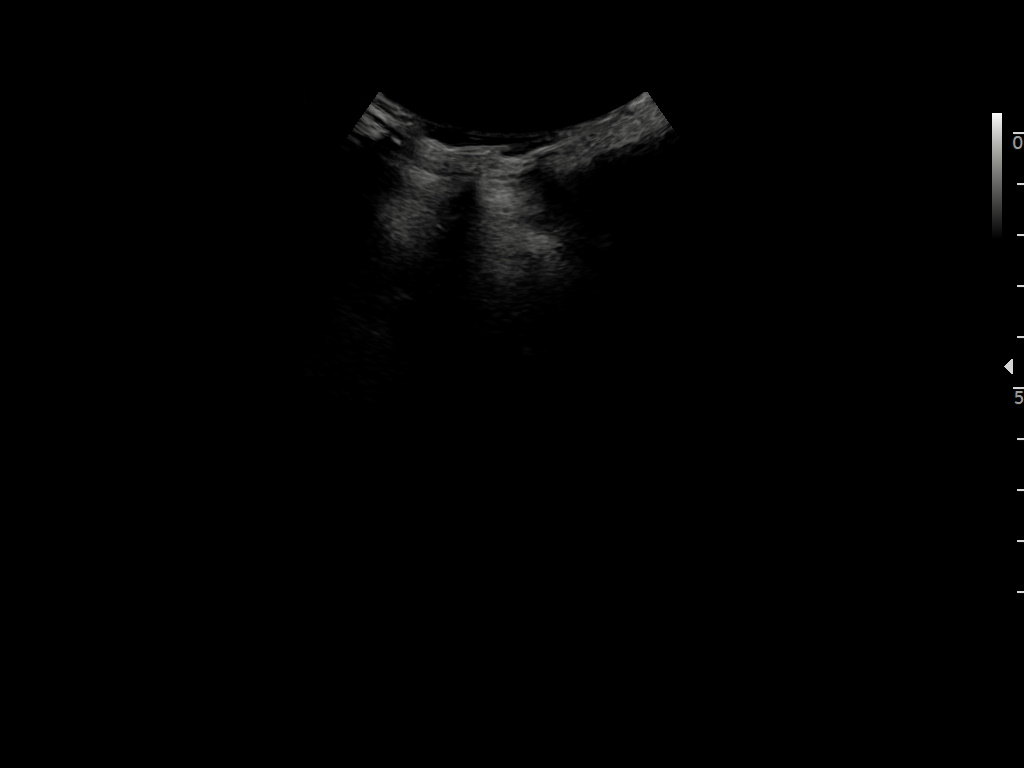
[im 3/3]
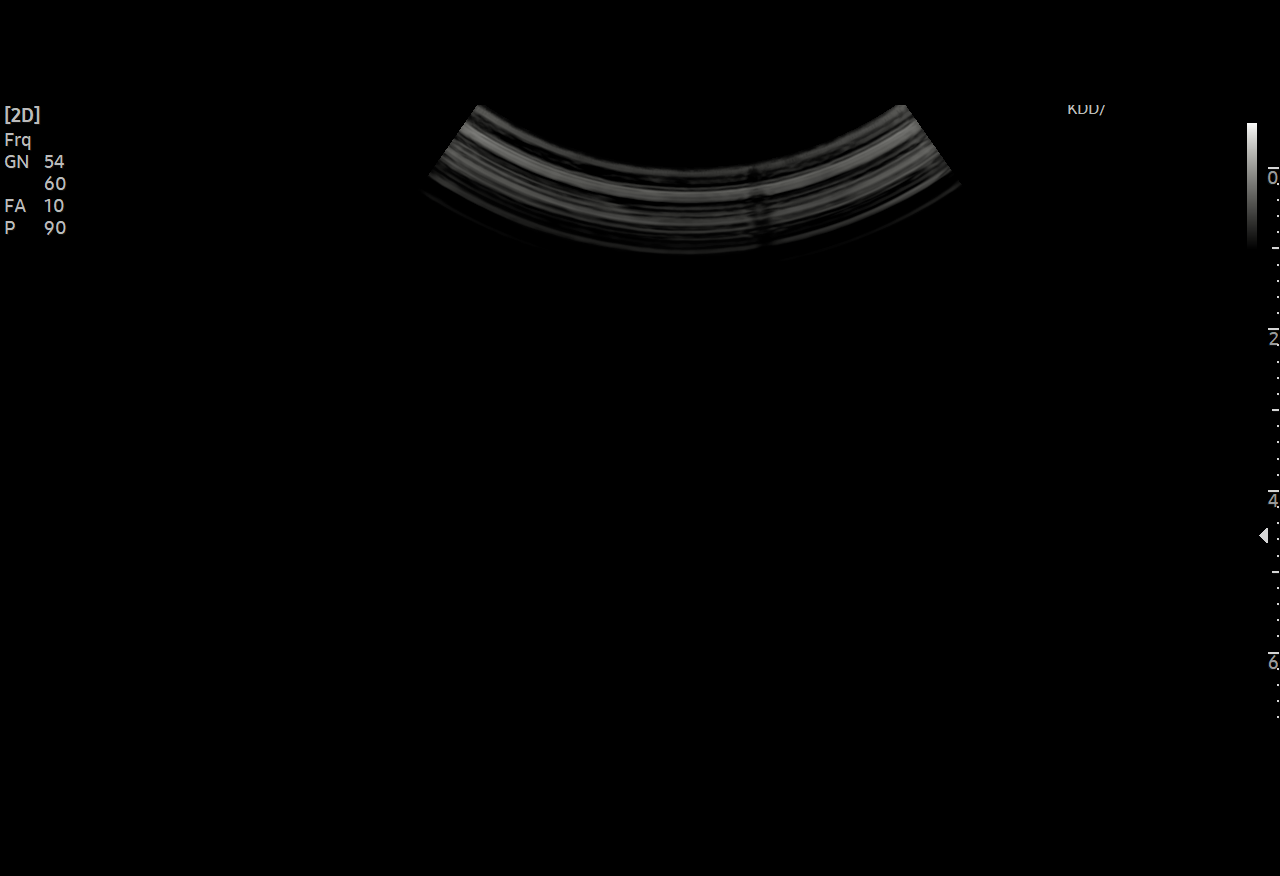

[6 of 6 positions shown; findings below may reference images not displayed]

EXAM:
ULTRASOUND GUIDED THORACENTESIS

MEDICATIONS:
1% lidocaine 10 mL

COMPLICATIONS:
None immediate.

PROCEDURE:
An ultrasound guided thoracentesis was thoroughly discussed with the
patient and questions answered. The benefits, risks, alternatives
and complications were also discussed. The patient understands and
wishes to proceed with the procedure. Written consent was obtained.

Ultrasound was performed to localize and mark an adequate pocket of
fluid in the left chest. The area was then prepped and draped in the
normal sterile fashion. 1% Lidocaine was used for local anesthesia.
Under ultrasound guidance a 6 Fr Safe-T-Centesis catheter was
introduced. Thoracentesis was performed. The catheter was removed
and a dressing applied.
FINDINGS: A total of approximately 1.3 L of amber-colored fluid was removed.
IMPRESSION: Successful ultrasound guided left thoracentesis yielding 1.3 L of
pleural fluid. Read by: MONO, NP

## 2021-04-05 MED ORDER — SODIUM CHLORIDE 0.9% FLUSH
10.0000 mL | Freq: Once | INTRAVENOUS | Status: AC
Start: 2021-04-05 — End: 2021-04-05
  Administered 2021-04-05: 10 mL via INTRAVENOUS
  Filled 2021-04-05: qty 10

## 2021-04-05 MED ORDER — HEPARIN SOD (PORK) LOCK FLUSH 100 UNIT/ML IV SOLN
500.0000 [IU] | Freq: Once | INTRAVENOUS | Status: AC
  Administered 2021-04-05: 500 [IU] via INTRAVENOUS
  Filled 2021-04-05: qty 5

## 2021-04-05 MED ORDER — SODIUM CHLORIDE 0.9 % IV SOLN
INTRAVENOUS | Status: DC
  Filled 2021-04-05 (×2): qty 250

## 2021-04-05 NOTE — Progress Notes (Signed)
[facility] CONSULT NOTE  Patient Care Team: Pcp, No as PCP - General [name], RN as Oncology Nurse Navigator  CHIEF COMPLAINTS/PURPOSE OF CONSULTATION: lung nodule/mass  #  Oncology History Overview Note  IMPRESSION: 1. Patchy nodular fat stranding throughout the anterior left upper quadrant peritoneal fat, nonspecific, cannot exclude peritoneal carcinomatosis. Dedicated CT abdomen/pelvis with oral and IV contrast recommended for further evaluation. 2. Dense patchy consolidation replacing much of the left lower lung lobe, appearing masslike in the superior segment left lower lobe, with associated bulging of the left major fissure and associated left lower lobe volume loss. Fine nodularity throughout both lungs with an upper lobe predominance. Asymmetric left upper lobe interlobular septal thickening. These findings are indeterminate, with differential including multilobar pneumonia, sarcoidosis or a neoplastic process. The persistence on radiographs back to 01/20/2021 despite antibiotic therapy make sarcoidosis or a neoplastic process more likely. Pulmonology consultation suggested for consideration of bronchoscopic evaluation. 3. Small dependent left pleural effusion. 4. Mild mediastinal lymphadenopathy, nonspecific. 5. Subacute healing lateral right sixth rib fracture.   DIAGNOSIS:  A. LUNG, LEFT LOWER LOBE; ENB-ASSISTED BIOPSY:  - NON-SMALL CELL CARCINOMA, FAVOR ADENOCARCINOMA.  - FOREIGN MATERIAL SUGGESTIVE OF ASPIRATION.   There is sufficient material for limited ancillary studi   Cancer of lower lobe of left lung ([place])  03/23/2021 Initial Diagnosis   Cancer of lower lobe of left lung ([place])   03/29/2021 Cancer Staging   Staging form: Lung, AJCC 8th Edition - Clinical: Stage IVB (cT3, cN3, pM1c) - Signed by [name], MD on 03/29/2021    03/30/2021 -  Chemotherapy    Patient is on Treatment Plan: LUNG NSCLC PEMETREXED (ALIMTA) /  CARBOPLATIN Q21D X 1 CYCLES          HISTORY OF PRESENTING ILLNESS:  [name] 40 y.o.  male patient with stage IV lung cancer adenocarcinoma is here for follow-up.    Patient received cycle #1 chemotherapy approximately 10 days ago with carboplatin Alimta.  He was evaluated in the symptom management clinic 2 days ago for worsening shortness of breath tachycardia.  Patient underwent left pleural effusion thoracentesis.  Noted to have significant improvement of difficulty breathing/and also tachycardia.  Poor appetite.  No nausea no vomiting.  No headaches.  Complains of flushing post dexamethasone pills.   Notes of acneform like rash on the back/anterior chest.   Review of Systems  Constitutional:  Positive for malaise/fatigue and weight loss. Negative for chills, diaphoresis and fever.  HENT:  Negative for nosebleeds and sore throat.   Eyes:  Negative for double vision.  Respiratory:  Positive for cough and shortness of breath. Negative for wheezing.   Cardiovascular:  Negative for chest pain, palpitations, orthopnea and leg swelling.  Gastrointestinal:  Positive for constipation. Negative for abdominal pain, blood in stool, diarrhea, heartburn, melena, nausea and vomiting.  Genitourinary:  Negative for dysuria, frequency and urgency.  Musculoskeletal:  Negative for back pain and joint pain.  Skin: Negative.  Negative for itching and rash.  Neurological:  Negative for dizziness, tingling, focal weakness, weakness and headaches.  Endo/Heme/Allergies:  Does not bruise/bleed easily.  Psychiatric/Behavioral:  Negative for depression. The patient is not nervous/anxious and does not have insomnia.     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    SURGICAL HISTORY: Past  Surgical History:  Procedure Laterality Date   FRACTURE SURGERY     broke femur  when he was 6 years   PORTA CATH INSERTION N/A 03/28/2021   Procedure: PORTA CATH INSERTION;  Surgeon: Katha Cabal, MD;  Location: [facility];  Service: Cardiovascular;  Laterality: N/A;   VIDEO BRONCHOSCOPY WITH ENDOBRONCHIAL NAVIGATION N/A 03/15/2021   Procedure: VIDEO BRONCHOSCOPY WITH ENDOBRONCHIAL NAVIGATION;  Surgeon: Ottie Glazier, MD;  Location: [facility];  Service: Thoracic;  Laterality: N/A;   VIDEO BRONCHOSCOPY WITH ENDOBRONCHIAL ULTRASOUND N/A 03/15/2021   Procedure: VIDEO BRONCHOSCOPY WITH ENDOBRONCHIAL ULTRASOUND;  Surgeon: Ottie Glazier, MD;  Location: [facility];  Service: Thoracic;  Laterality: N/A;    SOCIAL HISTORY: Social History   Socioeconomic History   Marital status: Married    Spouse name: [name]   Number of children: 2   Years of education: Not on file   Highest education level: Not on file  Occupational History   Not on file  Tobacco Use   Smoking status: Never   Smokeless tobacco: Current    Types: Snuff, Chew  Vaping Use   Vaping Use: Never used  Substance and Sexual Activity   Alcohol use: Never   Drug use: Never   Sexual activity: Not on file  Other Topics Concern   Not on file  Social History Narrative   [place]; with wife; 2 daughters[12 and 22]; never smoked; rare alcohol. Work in [employer]    Social Determinants of Health   Financial Resource Strain: Not on file  Food Insecurity: Not on file  Transportation Needs: Not on file  Physical Activity: Not on file  Stress: Not on file  Social Connections: Not on file  Intimate Partner Violence: Not on file    FAMILY HISTORY: Family History  Problem Relation Age of Onset   High Cholesterol Mother    Hypertension Father    Cancer Father    Lung cancer Father    Skin cancer Father     ALLERGIES:  is allergic to codeine.  MEDICATIONS:  Current Outpatient Medications  Medication Sig Dispense Refill   chlorpheniramine-HYDROcodone (TUSSIONEX) 10-8 MG/5ML SUER  Take 5 mLs by mouth every 12 (twelve) hours as needed for cough. 140 mL 0   dexamethasone (DECADRON) 2 MG tablet Take 1 tablet (2 mg total) by mouth 2 (two) times daily for 10 days. 20 tablet 0   folic acid (FOLVITE) 1 MG tablet Take 1 tablet (1 mg total) by mouth daily. 90 tablet 1   lidocaine-prilocaine (EMLA) cream Apply 1 application topically as needed. 30 g 0   LORazepam (ATIVAN) 0.5 MG tablet Take 1 tablet (0.5 mg total) by mouth every 8 (eight) hours. 30 tablet 0   metoprolol succinate (TOPROL XL) 25 MG 24 hr tablet Take 1 tablet (25 mg total) by mouth daily. 30 tablet 1   ondansetron (ZOFRAN) 8 MG tablet One pill every 8 hours as needed for nausea/vomitting. 40 tablet 1   prochlorperazine (COMPAZINE) 10 MG tablet Take 1 tablet (10 mg total) by mouth every 6 (six) hours as needed for nausea or vomiting. 40 tablet 1   sertraline (ZOLOFT) 50 MG tablet Take 0.5 tablets (25 mg total) by mouth daily. 30 tablet 0   HYDROcodone-acetaminophen (NORCO/VICODIN) 5-325 MG tablet Take 1 tablet by mouth every 6 (six) hours as needed for moderate pain. (Patient not taking: No sig reported) 90 tablet 0   No current facility-administered medications for this visit.   Facility-Administered  Medications Ordered in Other Visits  Medication Dose Route Frequency Provider Last Rate Last Admin   0.9 %  sodium chloride infusion   Intravenous Continuous [name], MD   Stopped at 04/05/21 1027      .  PHYSICAL EXAMINATION: ECOG PERFORMANCE STATUS: 1 - Symptomatic but completely ambulatory  Vitals:   04/05/21 0829  BP: 121/81  Pulse: 76  Resp: 20  Temp: 97.6 F (36.4 C)  SpO2: 97%   Filed Weights   04/05/21 0829  Weight: 191 lb (86.6 kg)    Physical Exam Constitutional:      Comments: Patient is wheelchair because of pain.  With his wife.  HENT:     Head: Normocephalic and atraumatic.     Mouth/Throat:     Pharynx: No oropharyngeal exudate.  Eyes:     Pupils: Pupils are equal,  round, and reactive to light.  Cardiovascular:     Rate and Rhythm: Normal rate and regular rhythm.  Pulmonary:     Effort: No respiratory distress.     Breath sounds: No wheezing.     Comments: Decreased breath sound on the left side compared to right. Abdominal:     General: Bowel sounds are normal. There is no distension.     Palpations: Abdomen is soft. There is no mass.     Tenderness: There is no abdominal tenderness. There is no guarding or rebound.  Musculoskeletal:        General: No tenderness. Normal range of motion.     Cervical back: Normal range of motion and neck supple.  Skin:    General: Skin is warm.  Neurological:     Mental Status: He is alert and oriented to person, place, and time.  Psychiatric:        Mood and Affect: Affect normal.     LABORATORY DATA:  I have reviewed the data as listed Lab Results  Component Value Date   WBC 11.0 (H) 04/05/2021   HGB 15.1 04/05/2021   HCT 43.1 04/05/2021   MCV 88.3 04/05/2021   PLT 201 04/05/2021   Recent Labs    03/23/21 1233 03/29/21 0632 04/03/21 1043 04/05/21 0808  NA 133* 132* 129* 132*  K 4.2 4.3 3.8 3.7  CL 95* 97* 94* 99  CO2 $Re'24 28 27 26  'aUU$ GLUCOSE 96 106* 96 108*  BUN $Re'17 13 17 15  'bnz$ CREATININE 0.82 0.76 0.67 0.63  CALCIUM 8.7* 8.2* 8.2* 8.2*  GFRNONAA >60 >60 >60 >60  PROT 7.1  --  6.2* 6.1*  ALBUMIN 3.2*  --  2.8* 2.7*  AST 50*  --  55* 41  ALT 97*  --  143* 107*  ALKPHOS 399*  --  394* 335*  BILITOT 0.9  --  0.9 0.8    RADIOGRAPHIC STUDIES: I have personally reviewed the radiological images as listed and agreed with the findings in the report. DG Chest 2 View  Result Date: 04/03/2021 CLINICAL DATA:  Hypoxia, pleural effusion and lung cancer. EXAM: CHEST - 2 VIEW COMPARISON:  03/28/2021 FINDINGS: Near complete opacification of the LEFT hemithorax is noted with interval aeration of a small portion of the LEFT UPPER lung. RIGHT lung interstitial opacities and RIGHT IJ Port-A-Cath with tip  overlying the SUPERIOR cavoatrial junction noted. There is no evidence of pneumothorax or RIGHT pleural effusion. No acute bony abnormalities are identified. IMPRESSION: Little interval change except for aeration of a small portion of the LEFT UPPER lung. Electronically Signed   By: Dellis Filbert  Hu M.D.   On: 04/03/2021 10:33   MR BRAIN W WO CONTRAST  Result Date: 03/28/2021 CLINICAL DATA:  Staging for lung mass EXAM: MRI HEAD WITHOUT AND WITH CONTRAST TECHNIQUE: Multiplanar, multiecho pulse sequences of the brain and surrounding structures were obtained without and with intravenous contrast. CONTRAST:  63m GADAVIST GADOBUTROL 1 MMOL/ML IV SOLN COMPARISON:  None. FINDINGS: Brain: There are numerous contrast-enhancing lesions throughout the brain. The largest lesions are in the right frontal and parietal lobes. The largest right parietal lesion measures 11 mm with moderate surrounding edema. The largest right frontal lesion measures 10 mm with mild surrounding edema. There are greater than 20 lesions in total. No midline shift or other mass effect. No acute infarct. No acute hemorrhage. Ventricular sizes are normal. Vascular: Normal flow voids. Skull and upper cervical spine: Normal marrow signal. Sinuses/Orbits: Negative. Other: None. IMPRESSION: Numerous scattered intraparenchymal metastases. The largest lesions are in the right frontal and parietal lobes with moderate surrounding edema. No midline shift or other mass effect. Electronically Signed   By: KUlyses JarredM.D.   On: 03/28/2021 22:27   PERIPHERAL VASCULAR CATHETERIZATION  Result Date: 03/29/2021 See surgical note for result.  NM PET Image Initial (PI) Skull Base To Thigh  Result Date: 03/28/2021 CLINICAL DATA:  Initial treatment strategy for non-small cell lung carcinoma. EXAM: NUCLEAR MEDICINE PET SKULL BASE TO THIGH TECHNIQUE: 10.7 mCi F-18 FDG was injected intravenously. Full-ring PET imaging was performed from the skull base to thigh after  the radiotracer. CT data was obtained and used for attenuation correction and anatomic localization. Fasting blood glucose: 90 mg/dl COMPARISON:  03/15/2021 FINDINGS: Mediastinal blood-pool activity (background): SUV max = 2.6 Liver activity (reference): SUV max = N/A NECK: 6 mm left supraclavicular lymph node on image 538/1is hypermetabolic, with SUV max of 3.6. Incidental CT findings:  None. CHEST: Left lung collapse is seen with a large hypermetabolic masslike opacity in the perihilar region and left lower lobe, which measures approximately 7.0 x 5.8 cm on image 101/3 and has SUV max of 17.5. Hypermetabolic activity in the collapsed left upper lobe is likely secondary to atelectasis. Large left pleural effusion causes mediastinal shift to the right and shows pleural base areas mild FDG uptake, highly suspicious for malignant pleural effusion. A 10 mm subcarinal lymph node shows FDG uptake with SUV max of 3.9. A 10 mm right paratracheal lymph node shows FDG uptake with SUV max of 3.9. Incidental CT findings:  None. ABDOMEN/PELVIS: No abnormal hypermetabolic activity within the liver, pancreas, adrenal glands, or spleen. 1.7 cm hypermetabolic soft tissue density in the splenic hilum has SUV max of 6.2, and may represent a hypermetabolic lymph node or peritoneal implant. Ill-defined soft tissue density in the left upper quadrant omental fat is hypermetabolic with SUV max of 4.5, consistent with omental carcinomatosis. Hypermetabolic activity is seen involving the peritoneum in dependent pelvis, also suspicious for peritoneal carcinomatosis. No other hypermetabolic soft tissue masses or lymphadenopathy identified. Incidental CT findings:  None. SKELETON: Numerous hypermetabolic bone metastases are seen in the bilateral scapulae, bilateral ribs, thoracic and lumbosacral spine, and pelvis, most of which are lytic in appearance. Incidental CT findings:  None. IMPRESSION: Left lung collapse with 7 cm hypermetabolic  masslike opacity centered in the left lower lobe and involving the left hilum, consistent with primary bronchogenic carcinoma. Large malignant left pleural effusion. Hypermetabolic lymphadenopathy in mediastinum and left supraclavicular region. Peritoneal/omental carcinomatosis. Diffuse bone metastases. Electronically Signed   By: JMyles RosenthalD.  On: 03/28/2021 09:59   DG Chest Port 1 View  Result Date: 04/04/2021 CLINICAL DATA:  40 year old male with a history of left-sided pleural effusion EXAM: PORTABLE CHEST 1 VIEW COMPARISON:  04/03/2021 FINDINGS: Cardiomediastinal silhouette likely unchanged and partially obstructed by overlying lung/pleural disease. Persistent opacity of the left chest with partially aerated left upper lung. No pneumothorax. No air-fluid level. Unchanged appearance of the right lung with mild interlobular septal thickening/reticular opacities. No new confluent airspace disease. Unchanged right IJ port catheter. IMPRESSION: No complicating features status post left thoracentesis, with persisting pleural fluid and lung consolidation/atelectasis. Electronically Signed   By: Corrie Mckusick D.O.   On: 04/04/2021 15:56   DG Chest Port 1 View  Result Date: 03/28/2021 CLINICAL DATA:  Pleural effusion left, s/p thora EXAM: PORTABLE CHEST - 1 VIEW COMPARISON:  03/27/2021 PET-CT FINDINGS: No pneumothorax. Near complete opacification of left hemithorax with central air bronchograms. Heart size difficult to assess due to adjacent opacities. On the right, diffuse pulmonary vascular congestion. Lytic lesions in bilateral ribs are identified, with probable pathologic fracture involving the right sixth rib. IMPRESSION: 1. No pneumothorax post left thoracentesis, with persistent opacification of the left hemithorax. Electronically Signed   By: Lucrezia Europe M.D.   On: 03/28/2021 14:11   DG Chest Port 1 View  Result Date: 03/15/2021 CLINICAL DATA:  Post bronchoscopy on the left EXAM: PORTABLE CHEST 1  VIEW COMPARISON:  CT chest 03/15/2021 FINDINGS: Mild to moderate left pleural effusion. Left lower lobe consolidation. No pneumothorax. Mild right lower lobe airspace disease appears new from the CT today. There is mild vascular congestion which may be due to mild fluid overload IMPRESSION: No pneumothorax post bronchoscopy Persistent left pleural effusion and left lower lobe consolidation/mass Progression of right lower lobe airspace disease Pulmonary vascular  congestion.  Question fluid overload Electronically Signed   By: Franchot Gallo M.D.   On: 03/15/2021 15:55   DG C-Arm 1-60 Min-No Report  Result Date: 03/15/2021 Fluoroscopy was utilized by the requesting physician.  No radiographic interpretation.   CT Super D Chest Wo Contrast  Result Date: 03/15/2021 CLINICAL DATA:  Recent pneumonia.  Cough. EXAM: CT CHEST WITHOUT CONTRAST TECHNIQUE: Multidetector CT imaging of the chest was performed using thin slice collimation for electromagnetic bronchoscopy planning purposes, without intravenous contrast. COMPARISON:  02/23/2021 FINDINGS: Cardiovascular: The heart size is normal. No substantial pericardial effusion. No thoracic aortic aneurysm. Mediastinum/Nodes: 10 mm short axis right paratracheal node evident. 10 mm short axis subcarinal lymph node associated. No definite right hilar lymphadenopathy on this noncontrast study. Soft tissue fullness noted in the left hilum although assessment limited by lack of intravenous contrast material. The esophagus has normal imaging features. There is no axillary lymphadenopathy. Lungs/Pleura: Diffuse micro nodularity again noted bilaterally with an upper lung predominance. The collapse/consolidative change in the left lower lobe is similar to prior with 6.1 x 4.0 cm measured masslike consolidative opacity stable at 6.1 x 4.5 cm today. Small left pleural effusion evident. Upper Abdomen: Nodular soft tissue attenuation in the left upper quadrant/omental region is again  noted. Musculoskeletal: 11 mm lucency is identified in the T10 vertebral body (43/2) IMPRESSION: 1. No substantial interval change in the dense consolidative opacity involving the left lower lobe with associated small left pleural effusion. 2. Bilateral relatively symmetric micro nodularity demonstrates an upper lobe predominance, unchanged. This may be infectious/inflammatory in etiology. 3. Similar appearance of the ill-defined nodular soft tissue involving the anterior left upper quadrant, likely omental. Given that metastatic disease  could certainly have this appearance, dedicated abdomen/pelvis CT with oral and intravenous contrast recommended to further evaluate. Electronically Signed   By: Misty Stanley M.D.   On: 03/15/2021 12:48   US THORACENTESIS ASP PLEURAL SPACE W/IMG GUIDE  Result Date: 04/04/2021 INDICATION: Patient with history of non-small cell lung cancer and recurrent left pleural effusion presents today for therapeutic thoracentesis. EXAM: ULTRASOUND GUIDED THORACENTESIS MEDICATIONS: 1% lidocaine 10 mL COMPLICATIONS: None immediate. PROCEDURE: An ultrasound guided thoracentesis was thoroughly discussed with the patient and questions answered. The benefits, risks, alternatives and complications were also discussed. The patient understands and wishes to proceed with the procedure. Written consent was obtained. Ultrasound was performed to localize and mark an adequate pocket of fluid in the left chest. The area was then prepped and draped in the normal sterile fashion. 1% Lidocaine was used for local anesthesia. Under ultrasound guidance a 6 Fr Safe-T-Centesis catheter was introduced. Thoracentesis was performed. The catheter was removed and a dressing applied. FINDINGS: A total of approximately 1.3 L of amber-colored fluid was removed. IMPRESSION: Successful ultrasound guided left thoracentesis yielding 1.3 L of pleural fluid. Read by: Soyla Dryer, NP Electronically Signed   By: Corrie Mckusick D.O.   On: 04/04/2021 15:56   US THORACENTESIS ASP PLEURAL SPACE W/IMG GUIDE  Result Date: 03/28/2021 INDICATION: Non-small cell lung carcinoma, metastatic.  Left pleural effusion. EXAM: ULTRASOUND GUIDED LEFT THORACENTESIS MEDICATIONS: Lidocaine 1% subcutaneous COMPLICATIONS: None immediate.  No pneumothorax on follow-up radiograph. PROCEDURE: An ultrasound guided thoracentesis was thoroughly discussed with the patient and questions answered. The benefits, risks, alternatives and complications were also discussed. The patient understands and wishes to proceed with the procedure. Written consent was obtained. Ultrasound was performed to localize and mark an adequate pocket of fluid in the left chest. The area was then prepped and draped in the normal sterile fashion. 1% Lidocaine was used for local anesthesia. Under ultrasound guidance a 6 Fr Safe-T-Centesis catheter was introduced. Thoracentesis was performed. The catheter was removed and a dressing applied. FINDINGS: A total of approximately 1.2 L of clear yellow fluid was removed. IMPRESSION: Successful ultrasound guided left thoracentesis yielding 1.2 L of pleural fluid. Electronically Signed   By: Lucrezia Europe M.D.   On: 03/28/2021 14:38    ASSESSMENT & PLAN:   Cancer of lower lobe of left lung ([place]) # STAGE IV- Left upper lobe lung cancer-adenocarcinoma the lung- PET June 2022- Left lung collapse with 7 cm hypermetabolic masslike opacity centered in the left lower lobe and involving the left hilum; Large malignant left pleural effusion;  Hypermetabolic lymphadenopathy in mediastinum and left supraclavicular region. Peritoneal/omental carcinomatosis; Diffuse bone metastases.  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  EGFR ALK Ros-pending. PDL-1 QNS.   #Patient s/p chemotherapy with carboplatin Alimta approximately 1 week ago.  Tolerated chemotherapy fairly well except for fatigue/worsening shortness of breath [see below]. If  negative for mutation would add- keytruda at next cycle.   #Worsening left-sided pleural effusion status postthoracentesis x2 [last 6/21]-if continues to get worse would consider Pleurx catheter.   #Multiple brain mets subcentimeter asymptomatic currently on dexamethasone 2 mg twice daily.  Awaiting mutation testing-if positive for any on the novel mutations hold off for brain radiation  # Cough/chest pain/anxiety--secondary to malignancy; continue Tussionex.  Space out Ativan from Tussionex at nighttime.  #Cardiac arrhythmia-SVT s/p adenosine post thoracentesis [June 2022]; Hx of WPW-stable.   #Poor nutrition status secondary malignancy albumin 2.5-appointment with Almyra Free today.  # DISPOSITION: # IVfluids today #  follow upas planned on July 7th MD; labs- cbc/cmp;carbo-alimta; ** ADD-keytruda.   # I reviewed the blood work- with the patient in detail; also reviewed the imaging independently [as summarized above]; and with the patient in detail.   # 40 minutes face-to-face with the patient discussing the above plan of care; more than 50% of time spent on prognosis/ natural history; counseling and coordination.    All questions were answered. The patient knows to call the clinic with any problems, questions or concerns.    [name], MD 04/05/2021 10:55 AM

## 2021-04-05 NOTE — Progress Notes (Addendum)
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 Patient with malignant pleural effusion.  Patient on chemotherapy.  Noted recent thoracentesis with 1.3 Liters removed. Past medical history of WPW syndrome, GERD, pneumonia.    Met with patient during infusion vs phone call.  Patient reports decreased appetite, improved little after thoracentesis.  Shortness of breath as improved.  Reports yesterday eating bacon, egg, cheese biscuit for breakfast, chicken noodle soup for lunch and 2 chicken tacos for dinner.  Reports has tried equate shake and caused diarrhea after bought of constipation.  Has been scared to try any other shakes.  Denies nausea.     Medications: zofran, compazine, dexamenthasone, folic acid   Labs: Na 206, glucose 108, cal 8.2   Anthropometrics:   Height: 70 inches Weight: 191 lb  UBW: 212 -220 lb BMI: 29  216 lb noted 03/15/21 12% weight loss in the last 3 weeks, significant   Estimated Energy Needs  Kcals: 2600-3000 Protein: 130-150 g Fluid: per MD with low sodium   NUTRITION DIAGNOSIS: Inadequate oral intake related to cancer and cancer related treatment side effects as evidenced by 12% weight loss in the last 3 weeks   INTERVENTION:  Encouraged small frequent meals especially when short of breath. Discussed ways to add calories and protein. Handout given on High calorie High Protein diet.  Recipe booklet given as well. Sample of shakes given to patient to try [facility] Shake, orgain, boost soothe, carnation breakfast essentials packet) Contact information provided   MONITORING, EVALUATION, GOAL: weight trends, intake   Next Visit: to be determined during treatment  Kimberleigh Mehan B. Zenia Resides, [place], [place] Registered Dietitian [phone] (mobile)

## 2021-04-05 NOTE — Patient Instructions (Signed)
[facility]  Discharge Instructions: Thank you for choosing  to provide your oncology and hematology care.  If you have a lab appointment with the [facility], please go directly to the [facility] and check in at the registration area.  Wear comfortable clothing and clothing appropriate for easy access to any Portacath or PICC line.   We strive to give you quality time with your provider. You may need to reschedule your appointment if you arrive late (15 or more minutes).  Arriving late affects you and other patients whose appointments are after yours.  Also, if you miss three or more appointments without notifying the office, you may be dismissed from the clinic at the provider's discretion.      For prescription refill requests, have your pharmacy contact our office and allow 72 hours for refills to be completed.    Today you received the following : hydration    To help prevent nausea and vomiting after your treatment, we encourage you to take your nausea medication as directed.  BELOW ARE SYMPTOMS THAT SHOULD BE REPORTED IMMEDIATELY: *FEVER GREATER THAN 100.4 F (38 C) OR HIGHER *CHILLS OR SWEATING *NAUSEA AND VOMITING THAT IS NOT CONTROLLED WITH YOUR NAUSEA MEDICATION *UNUSUAL SHORTNESS OF BREATH *UNUSUAL BRUISING OR BLEEDING *URINARY PROBLEMS (pain or burning when urinating, or frequent urination) *BOWEL PROBLEMS (unusual diarrhea, constipation, pain near the anus) TENDERNESS IN MOUTH AND THROAT WITH OR WITHOUT PRESENCE OF ULCERS (sore throat, sores in mouth, or a toothache) UNUSUAL RASH, SWELLING OR PAIN  UNUSUAL VAGINAL DISCHARGE OR ITCHING   Items with * indicate a potential emergency and should be followed up as soon as possible or go to the Emergency Department if any problems should occur.  Please show the CHEMOTHERAPY ALERT CARD or IMMUNOTHERAPY ALERT CARD at check-in to the Emergency Department and triage  nurse.  Should you have questions after your visit or need to cancel or reschedule your appointment, please contact [facility]  [phone] and follow the prompts.  Office hours are 8:00 a.m. to 4:30 p.m. Monday - Friday. Please note that voicemails left after 4:00 p.m. may not be returned until the following business day.  We are closed weekends and major holidays. You have access to a nurse at all times for urgent questions. Please call the main number to the clinic [phone] and follow the prompts.  For any non-urgent questions, you may also contact your provider using MyChart. We now offer e-Visits for anyone 61 and older to request care online for non-urgent symptoms. For details visit [email or web address].   Also download the MyChart app! Go to the app store, search "MyChart", open the app, select [facility], and log in with your MyChart username and password.  Due to Covid, a mask is required upon entering the hospital/clinic. If you do not have a mask, one will be given to you upon arrival. For doctor visits, patients may have 1 support person aged 62 or older with them. For treatment visits, patients cannot have anyone with them due to current Covid guidelines and our immunocompromised population.

## 2021-04-05 NOTE — Assessment & Plan Note (Addendum)
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 Peritoneal/omental carcinomatosis; Diffuse bone metastases.  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  EGFR ALK Ros-pending. PDL-1 QNS.   #Patient s/p chemotherapy with carboplatin Alimta approximately 1 week ago.  Tolerated chemotherapy fairly well except for fatigue/worsening shortness of breath [see below]. If negative for mutation would add- keytruda at next cycle.   #Worsening left-sided pleural effusion status postthoracentesis x2 [last 6/21]-if continues to get worse would consider Pleurx catheter.   #Multiple brain mets subcentimeter asymptomatic currently on dexamethasone 2 mg twice daily.  Awaiting mutation testing-if positive for any on the novel mutations hold off for brain radiation  # Cough/chest pain/anxiety--secondary to malignancy; continue Tussionex.  Space out Ativan from Tussionex at nighttime.  #Cardiac arrhythmia-SVT s/p adenosine post thoracentesis [June 2022]; Hx of WPW-stable.   #Poor nutrition status secondary malignancy albumin 2.5-appointment with Almyra Free today.  # DISPOSITION: # IVfluids today # follow upas planned on July 7th MD; labs- cbc/cmp;carbo-alimta; ** ADD-keytruda.   # I reviewed the blood work- with the patient in detail; also reviewed the imaging independently [as summarized above]; and with the patient in detail.   # 40 minutes face-to-face with the patient discussing the above plan of care; more than 50% of time spent on prognosis/ natural history; counseling and coordination.

## 2021-04-06 ENCOUNTER — Encounter: Payer: Self-pay | Admitting: *Deleted

## 2021-04-07 ENCOUNTER — Telehealth: Payer: Self-pay | Admitting: Internal Medicine

## 2021-04-07 MED ORDER — OSIMERTINIB MESYLATE 80 MG PO TABS: 80.0000 mg | ORAL_TABLET | Freq: Every day | ORAL | 4 refills | Status: DC

## 2021-04-07 NOTE — Telephone Encounter (Signed)
On 6/24-I spoke to patient's wife regarding results of the EGFR deletion 19/mutation positive.  Patient will be candidate for Tagrisso/osimertinib.  Discussed that this would have CNS penetration; and given asymptomatic brain metastases-think it is reasonable to hold off for brain radiation at this time.  Alysson/Judy-I would appreciate if you could work on this STAT; as getting the pill ASAP would be very crucial.  Appreciate in advance for making this a priority. Printed the script-  Thanks! GB

## 2021-04-10 ENCOUNTER — Encounter: Payer: Self-pay | Admitting: Internal Medicine

## 2021-04-10 ENCOUNTER — Other Ambulatory Visit (HOSPITAL_COMMUNITY): Payer: Self-pay

## 2021-04-10 ENCOUNTER — Telehealth: Payer: Self-pay | Admitting: Pharmacist

## 2021-04-10 ENCOUNTER — Encounter: Payer: Self-pay | Admitting: Nurse Practitioner

## 2021-04-10 ENCOUNTER — Other Ambulatory Visit: Payer: Self-pay | Admitting: *Deleted

## 2021-04-10 ENCOUNTER — Telehealth: Payer: Self-pay | Admitting: Pharmacy Technician

## 2021-04-10 DIAGNOSIS — C3492 Malignant neoplasm of unspecified part of left bronchus or lung: Secondary | ICD-10-CM

## 2021-04-10 DIAGNOSIS — C7931 Secondary malignant neoplasm of brain: Secondary | ICD-10-CM

## 2021-04-10 MED ORDER — OSIMERTINIB MESYLATE 80 MG PO TABS
80.0000 mg | ORAL_TABLET | Freq: Every day | ORAL | 4 refills | Status: DC
  Filled 2021-04-10: qty 30, 30d supply, fill #0

## 2021-04-10 MED ORDER — DEXAMETHASONE 2 MG PO TABS: 2.0000 mg | ORAL_TABLET | Freq: Two times a day (BID) | ORAL | 0 refills | Status: DC

## 2021-04-10 NOTE — Telephone Encounter (Signed)
Oral Oncology Pharmacist Encounter  Received new prescription for Tagrisso (osimertinib) for the treatment of stage IV lung cancer, EGFR deletion 19 positive, planned duration until disease progression or unacceptable drug toxicity.  Prescription dose and frequency assessed.   Current medication list in Epic reviewed, a few DDIs with osimertinib identified: -Ondansetron and sertraline: Ondansetron and sertraline may enhance the QTc-prolonging effect of osimertinib. No baseline dose adjustment needed. Patient has a recent ECG from 03/28/21, that showed a QTc of 463.  Evaluated chart and no patient barriers to medication adherence identified.   Prescription has been e-scribed to the [facility] for benefits analysis and approval.  Oral Oncology Clinic will continue to follow for insurance authorization, copayment issues, initial counseling and start date.   Darl Pikes, PharmD, BCPS, BCOP, CPP Hematology/Oncology Clinical Pharmacist Practitioner [facility]/AP [facility] [phone]  04/10/2021 9:09 AM

## 2021-04-10 NOTE — Telephone Encounter (Signed)
Oral Oncology Patient Advocate Encounter   Received notification from Christella Scheuermann that prior authorization for Tagrisso is required.   PA submitted through [facility] online portal. Case: [id number] Status is pending  Prior authorization could not be completed through [facility] and has to be completed through [place].  Called Christella Scheuermann [phone]) to complete PA over the phone.  PA has been approved from 04/05/21-6/22-23. Case # [id number]   [name] Patient [place] Phone [phone] Fax [phone] 04/10/2021 4:26 PM

## 2021-04-11 ENCOUNTER — Other Ambulatory Visit (HOSPITAL_COMMUNITY): Payer: Self-pay

## 2021-04-11 ENCOUNTER — Encounter: Payer: Self-pay | Admitting: Internal Medicine

## 2021-04-11 ENCOUNTER — Encounter: Payer: Self-pay | Admitting: Nurse Practitioner

## 2021-04-11 ENCOUNTER — Inpatient Hospital Stay: Payer: Managed Care, Other (non HMO) | Admitting: Pharmacist

## 2021-04-11 DIAGNOSIS — Z5111 Encounter for antineoplastic chemotherapy: Secondary | ICD-10-CM | POA: Diagnosis not present

## 2021-04-11 DIAGNOSIS — C3492 Malignant neoplasm of unspecified part of left bronchus or lung: Secondary | ICD-10-CM

## 2021-04-11 MED ORDER — OSIMERTINIB MESYLATE 80 MG PO TABS: 80.0000 mg | ORAL_TABLET | Freq: Every day | ORAL | 4 refills | Status: DC

## 2021-04-11 NOTE — Progress Notes (Signed)
[facility]  Telephone:([phone] [phone]  Patient Care Team: Pcp, No as PCP - General [name], RN as Oncology Nurse Navigator   Name of the patient: [name]  [id number]  1981-08-01   Date of visit: 04/11/21  HPI: Patient is a 40 y.o. male with newly diagnosed metastatic NSCLC. Patient received one cycle of carboplatin and pemetrexed, then was found to have an EGFR deletion 19 mutation. His treatment is now being changed to [place] (osimertinib)  Reason for Consult: Tagrisso (osimertinib) oral chemotherapy education.   PAST MEDICAL HISTORY: Past Medical History:  Diagnosis Date   Cancer ([place])    Dyspnea    Heartburn    Pneumonia    Wolff-Parkinson-White (WPW) 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    HEMATOLOGY/ONCOLOGY HISTORY:  Oncology History Overview Note  IMPRESSION: 1. Patchy nodular fat stranding throughout the anterior left upper quadrant peritoneal fat, nonspecific, cannot exclude peritoneal carcinomatosis. Dedicated CT abdomen/pelvis with oral and IV contrast recommended for further evaluation. 2. Dense patchy consolidation replacing much of the left lower lung lobe, appearing masslike in the superior segment left lower lobe, with associated bulging of the left major fissure and associated left lower lobe volume loss. Fine nodularity throughout both lungs with an upper lobe predominance. Asymmetric left upper lobe interlobular septal thickening. These findings are indeterminate, with differential including multilobar pneumonia, sarcoidosis or a neoplastic process. The persistence on radiographs back to 01/20/2021 despite antibiotic therapy make sarcoidosis or a neoplastic process more likely. Pulmonology consultation suggested for consideration of bronchoscopic evaluation. 3. Small dependent left pleural effusion. 4. Mild mediastinal lymphadenopathy, nonspecific. 5. Subacute healing lateral  right sixth rib fracture.   DIAGNOSIS:  A. LUNG, LEFT LOWER LOBE; ENB-ASSISTED BIOPSY:  - NON-SMALL CELL CARCINOMA, FAVOR ADENOCARCINOMA.  - FOREIGN MATERIAL SUGGESTIVE OF ASPIRATION.   There is sufficient material for limited ancillary studi   Cancer of lower lobe of left lung ([place])  03/23/2021 Initial Diagnosis   Cancer of lower lobe of left lung ([place])   03/29/2021 Cancer Staging   Staging form: Lung, AJCC 8th Edition - Clinical: Stage IVB (cT3, cN3, pM1c) - Signed by [name], MD on 03/29/2021    03/30/2021 -  Chemotherapy    Patient is on Treatment Plan: LUNG NSCLC PEMETREXED (ALIMTA) / CARBOPLATIN Q21D X 1 CYCLES         ALLERGIES:  is allergic to codeine.  MEDICATIONS:  Current Outpatient Medications  Medication Sig Dispense Refill   chlorpheniramine-HYDROcodone (TUSSIONEX) 10-8 MG/5ML SUER Take 5 mLs by mouth every 12 (twelve) hours as needed for cough. 140 mL 0   dexamethasone (DECADRON) 2 MG tablet Take 1 tablet (2 mg total) by mouth 2 (two) times daily for 14 days. 28 tablet 0   folic acid (FOLVITE) 1 MG tablet Take 1 tablet (1 mg total) by mouth daily. 90 tablet 1   HYDROcodone-acetaminophen (NORCO/VICODIN) 5-325 MG tablet Take 1 tablet by mouth every 6 (six) hours as needed for moderate pain. (Patient not taking: No sig reported) 90 tablet 0   lidocaine-prilocaine (EMLA) cream Apply 1 application topically as needed. 30 g 0   LORazepam (ATIVAN) 0.5 MG tablet Take 1 tablet (0.5 mg total) by mouth every 8 (eight) hours. 30 tablet 0   metoprolol succinate (TOPROL XL) 25 MG 24 hr tablet Take 1 tablet (25 mg total) by mouth daily. 30 tablet 1   ondansetron (ZOFRAN) 8 MG tablet One pill every 8 hours as needed  for nausea/vomitting. 40 tablet 1   osimertinib mesylate (TAGRISSO) 80 MG tablet Take 1 tablet (80 mg total) by mouth daily. 30 tablet 4   prochlorperazine (COMPAZINE) 10 MG tablet Take 1 tablet (10 mg total) by mouth every 6 (six) hours as needed for  nausea or vomiting. 40 tablet 1   sertraline (ZOLOFT) 50 MG tablet Take 0.5 tablets (25 mg total) by mouth daily. 30 tablet 0   No current facility-administered medications for this visit.    VITAL SIGNS: There were no vitals taken for this visit. There were no vitals filed for this visit.  Estimated body mass index is 27.41 kg/m as calculated from the following:   Height as of 04/05/21: _0  (1.778 m).   Weight as of 04/05/21: 86.6 kg (191 lb).  LABS: CBC:    Component Value Date/Time   WBC 11.0 (H) 04/05/2021 0808   HGB 15.1 04/05/2021 0808   HCT 43.1 04/05/2021 0808   PLT 201 04/05/2021 0808   MCV 88.3 04/05/2021 0808   NEUTROABS 7.4 04/05/2021 0808   LYMPHSABS 2.8 04/05/2021 0808   MONOABS 0.3 04/05/2021 0808   EOSABS 0.5 04/05/2021 0808   BASOSABS 0.0 04/05/2021 0808   Comprehensive Metabolic Panel:    Component Value Date/Time   NA 132 (L) 04/05/2021 0808   K 3.7 04/05/2021 0808   CL 99 04/05/2021 0808   CO2 26 04/05/2021 0808   BUN 15 04/05/2021 0808   CREATININE 0.63 04/05/2021 0808   GLUCOSE 108 (H) 04/05/2021 0808   CALCIUM 8.2 (L) 04/05/2021 0808   AST 41 04/05/2021 0808   ALT 107 (H) 04/05/2021 0808   ALKPHOS 335 (H) 04/05/2021 0808   BILITOT 0.8 04/05/2021 0808   PROT 6.1 (L) 04/05/2021 0808   ALBUMIN 2.7 (L) 04/05/2021 0808     Present during today's visit: Patient and his wife Vinnie Level  Assessment and Plan: Start plan: Patient took his first dose in clinic today 04/11/21 following his chemotherapy education   Patient Education I spoke with patient for overview of new oral chemotherapy medication: Tagrisso (osimertinib) for the treatment of stage IV lung cancer, EGFR deletion 19 positive, planned duration until disease progression or unacceptable drug toxicity.   Administration: Counseled patient on administration, dosing, side effects, monitoring, drug-food interactions, safe handling, storage, and disposal. Patient will take 1 tablet (80 mg  total) by mouth daily.  Side Effects: Side effects include but not limited to: rash/itchy skin, diarrhea, decreased wbc/hgb/plt.    Rash/itchy skin: Encouraged patient to keep his skin moisturized and to use sunscreen. They know to call if a rash appears.  Diarrhea: They will pick up some loperamide to have on hand if needed. They know to call the office if taking the loperamide and he continues to have 4 or more loose stools a day  Adherence: After discussion with patient no patient barriers to medication adherence identified.  Reviewed with patient importance of keeping a medication schedule and plan for any missed doses.  The [facility] voiced understanding and appreciation. All questions answered. Medication handout provided.  Provided patient with [facility] phone number. Patient knows to call the office with questions or concerns. Oral Chemotherapy Navigation Clinic will continue to follow.  Patient expressed understanding and was in agreement with this plan. He also understands that He can call clinic at any time with any questions, concerns, or complaints.   Medication Access Issues: Newman Nip was approved by the insurance, they require that he use [facility]. Prescription  redirected to [facility]. Vinnie Level provided with the number to [facility].  Follow-up plan: Patient will return to clinic on 7/7  Thank you for allowing me to participate in the care of this patient.   Time Total: 30 mins  Visit consisted of counseling and education on dealing with issues of symptom management in the setting of serious and potentially life-threatening illness.Greater than 50%  of this time was spent counseling and coordinating care related to the above assessment and plan.  Signed by: Darl Pikes, PharmD, BCPS, Salley Slaughter, CPP Hematology/Oncology Clinical Pharmacist Practitioner [facility] [phone]  04/11/2021 1:34 PM

## 2021-04-11 NOTE — Telephone Encounter (Signed)
Patient must use [facility] to fill Tagrisso.  Rx sent 04/11/21.  [facility] Patient [place] Phone [phone] Fax [phone] 04/11/2021 1:22 PM

## 2021-04-14 LAB — FUNGUS CULTURE RESULT

## 2021-04-14 LAB — FUNGUS CULTURE WITH STAIN

## 2021-04-14 LAB — FUNGAL ORGANISM REFLEX

## 2021-04-18 ENCOUNTER — Ambulatory Visit
Admission: RE | Admit: 2021-04-18 | Discharge: 2021-04-18 | Disposition: A | Discharge status: Home / Self Care | Payer: Managed Care, Other (non HMO) | Source: Ambulatory Visit | Attending: Internal Medicine | Admitting: Internal Medicine

## 2021-04-18 ENCOUNTER — Inpatient Hospital Stay: Payer: Managed Care, Other (non HMO) | Attending: Internal Medicine | Admitting: Hospice and Palliative Medicine

## 2021-04-18 ENCOUNTER — Other Ambulatory Visit (HOSPITAL_COMMUNITY): Payer: Self-pay

## 2021-04-18 ENCOUNTER — Other Ambulatory Visit: Payer: Self-pay | Admitting: *Deleted

## 2021-04-18 ENCOUNTER — Telehealth: Payer: Self-pay | Admitting: Pharmacy Technician

## 2021-04-18 ENCOUNTER — Other Ambulatory Visit: Payer: Self-pay

## 2021-04-18 ENCOUNTER — Encounter: Payer: Self-pay | Admitting: *Deleted

## 2021-04-18 VITALS — BP 119/82 | HR 70 | Temp 97.1°F | Resp 18 | Wt 201.4 lb

## 2021-04-18 DIAGNOSIS — M79671 Pain in right foot: Secondary | ICD-10-CM | POA: Insufficient documentation

## 2021-04-18 DIAGNOSIS — R600 Localized edema: Secondary | ICD-10-CM | POA: Insufficient documentation

## 2021-04-18 DIAGNOSIS — C7951 Secondary malignant neoplasm of bone: Secondary | ICD-10-CM | POA: Insufficient documentation

## 2021-04-18 DIAGNOSIS — I82441 Acute embolism and thrombosis of right tibial vein: Secondary | ICD-10-CM | POA: Diagnosis not present

## 2021-04-18 DIAGNOSIS — Z7901 Long term (current) use of anticoagulants: Secondary | ICD-10-CM | POA: Insufficient documentation

## 2021-04-18 DIAGNOSIS — Z452 Encounter for adjustment and management of vascular access device: Secondary | ICD-10-CM | POA: Insufficient documentation

## 2021-04-18 DIAGNOSIS — C7931 Secondary malignant neoplasm of brain: Secondary | ICD-10-CM | POA: Insufficient documentation

## 2021-04-18 DIAGNOSIS — C3432 Malignant neoplasm of lower lobe, left bronchus or lung: Secondary | ICD-10-CM | POA: Insufficient documentation

## 2021-04-18 IMAGING — US US EXTREM LOW VENOUS*R*
1 series · 13 of 24 positions shown · non-contrast
Comparison: None.

CLINICAL DATA: Right foot pain for the past 2 days. History of lung
cancer. Evaluate for DVT.



[Series 1: us venous img lower uni right (dvt) · portal-venous · 13 of 36 slices shown]
[im 1/36]
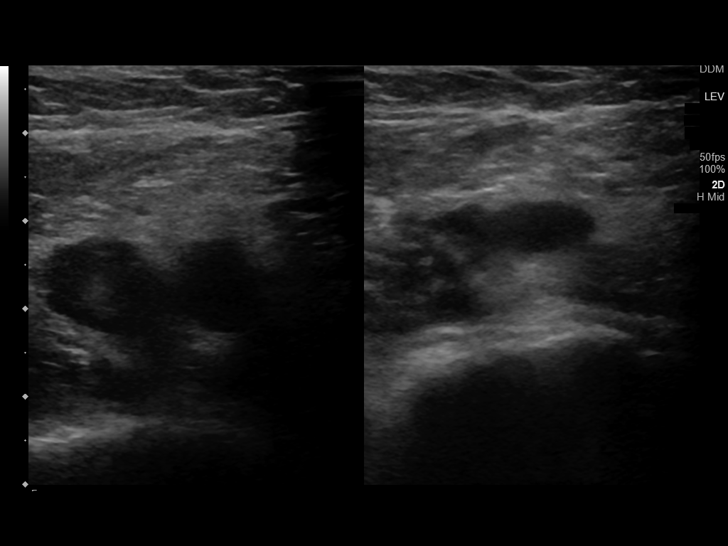
[im 4/36]
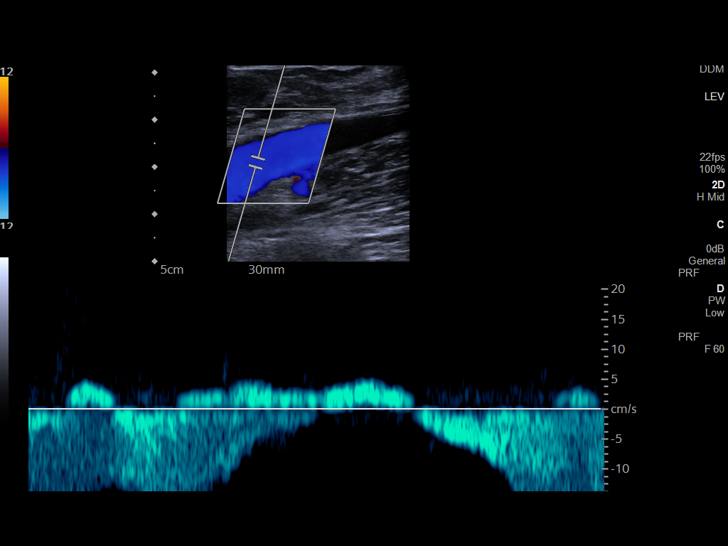
[im 7/36]
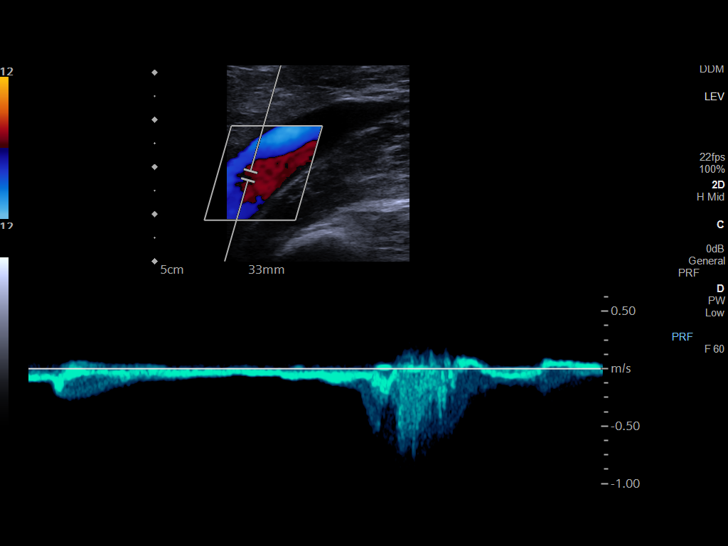
[im 10/36]
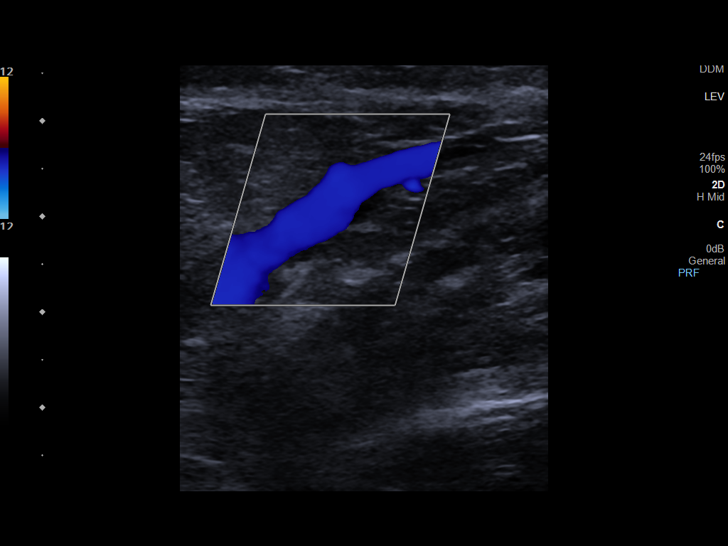
[im 13/36]
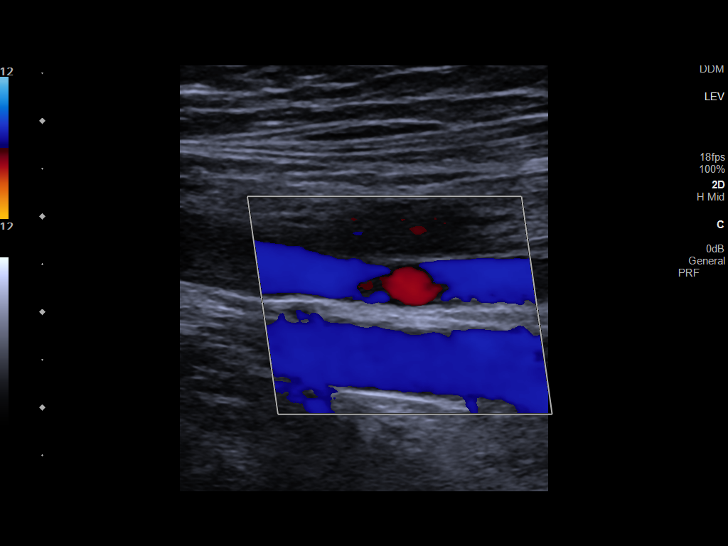
[im 16/36]
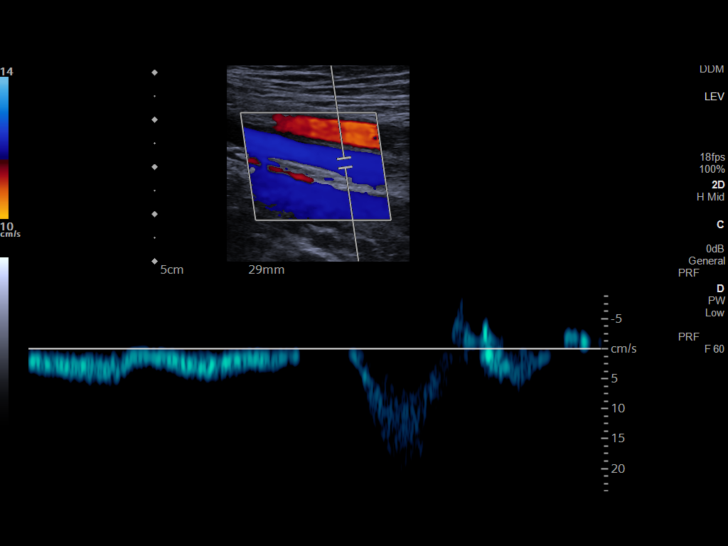
[im 19/36]
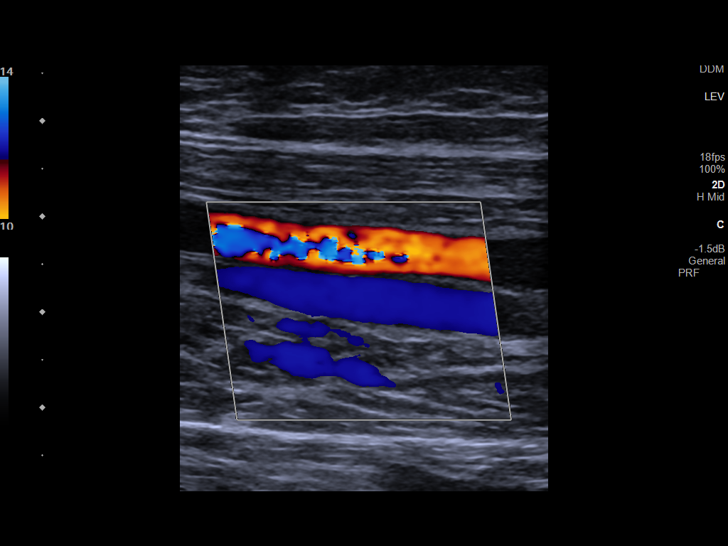
[im 20/36]
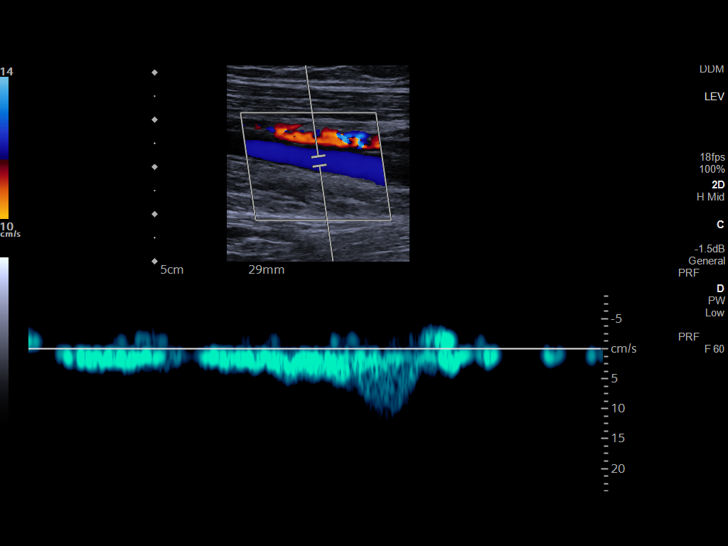
[im 23/36]
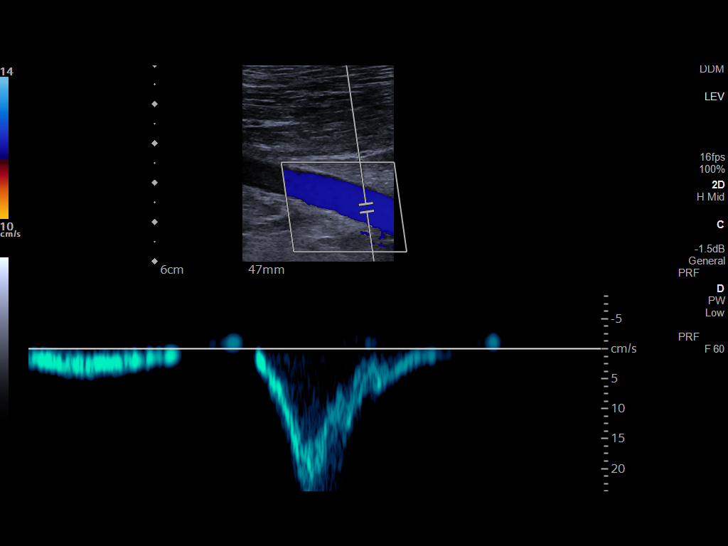
[im 26/36]
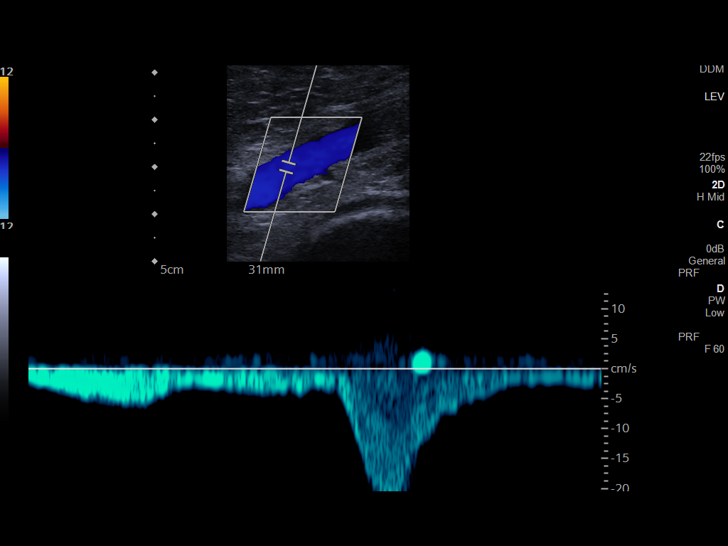
[im 29/36]
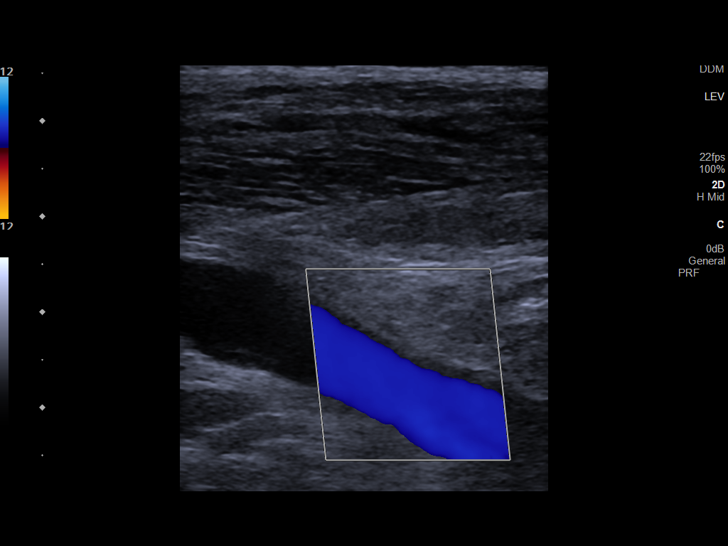
[im 32/36]
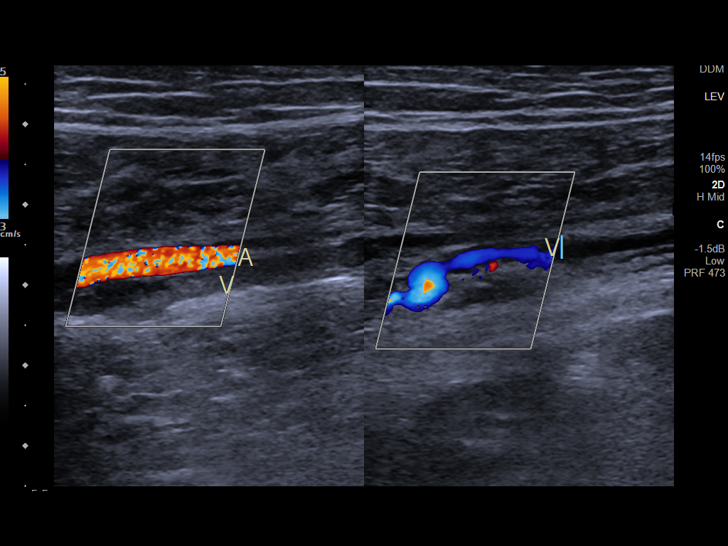
[im 36/36]
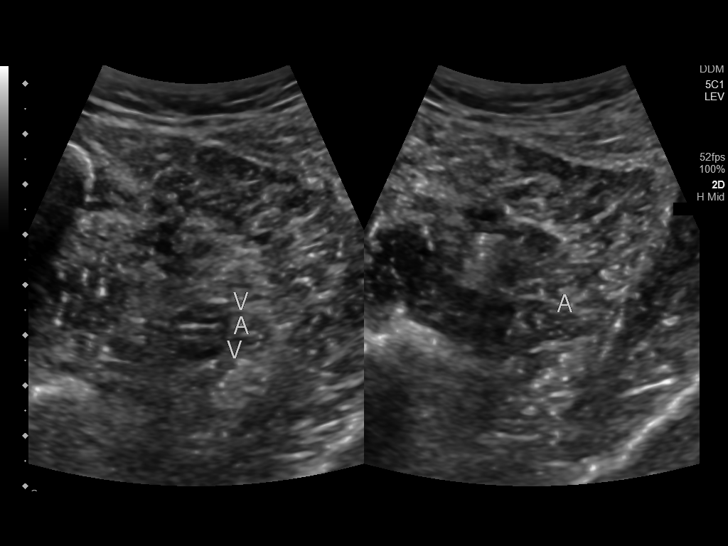

[13 of 24 positions shown; findings below may reference images not displayed]

FINDINGS: Contralateral Common Femoral Vein: Respiratory phasicity is normal
and symmetric with the symptomatic side. No evidence of thrombus.
Normal compressibility.

Common Femoral Vein: No evidence of thrombus. Normal
compressibility, respiratory phasicity and response to augmentation.

Saphenofemoral Junction: No evidence of thrombus. Normal
compressibility and flow on color Doppler imaging.

Profunda Femoral Vein: No evidence of thrombus. Normal
compressibility and flow on color Doppler imaging.

Femoral Vein: No evidence of thrombus. Normal compressibility,
respiratory phasicity and response to augmentation.

Popliteal Vein: No evidence of thrombus. Normal compressibility,
respiratory phasicity and response to augmentation.

Calf Veins: There is hypoechoic occlusive thrombus involving one of
the paired right posterior tibial veins (images 32 and 33). The
paired right peroneal veins appear patent where imaged.

Superficial Great Saphenous Vein: No evidence of thrombus. Normal
compressibility.

Other Findings:  None.
IMPRESSION: The examination is positive for occlusive DVT involving one of the
paired right posterior tibial veins. There is no extension of this
distal occlusive tibial DVT to the more proximal venous system of
the right lower extremity.

## 2021-04-18 MED ORDER — APIXABAN 5 MG PO TABS: ORAL_TABLET | ORAL | 2 refills | Status: DC

## 2021-04-18 NOTE — Telephone Encounter (Signed)
Oral Oncology Patient Advocate Encounter  Prior Authorization for Eliquis has been approved.    PA# [id number] Effective dates: 04/18/21 through 04/18/22  Patients co-pay is $30.00.  Patient given free 30 day trial card and copay card to reduce copay to $10 for refills.   Oral Oncology Clinic will continue to follow.   [facility] Patient [name] Phone [phone] Fax [phone] 04/18/2021 3:29 PM

## 2021-04-18 NOTE — Progress Notes (Signed)
Symptom Management [place]  [phone] [phone]  Patient Care Team: Pcp, No as PCP - General [name], RN as Oncology Nurse Navigator   Name of the patient: [name]  [id number]  Feb 25, 1981   Date of visit: 04/18/21  Reason for Consult: [name] is a 40 y.o. 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  Patient presents to the [facility] today for evaluation and management of right foot pain. Patient describes pain with ambulation to area of R. Heel/plantar foot. He says he has history of plantar fasciitis and this feels slightly different.  He describes pain as sharp.  He denies injury or trauma.  Denies redness with swelling.  No edema to calf.  No shortness of breath or chest pain.  Denies any neurologic complaints. Denies recent fevers or illnesses. Denies any easy bleeding or bruising. Reports adequate appetite without weight loss. Denies chest pain. Denies any nausea, vomiting or diarrhea.  Denies urinary complaints. Patient offers no further specific complaints today.  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    PAST SURGICAL HISTORY:  Past Surgical History:  Procedure Laterality Date   FRACTURE SURGERY     broke femur when he was 6 years   PORTA CATH INSERTION N/A 03/28/2021   Procedure: PORTA CATH INSERTION;  Surgeon: Katha Cabal, MD;  Location: [facility];  Service: Cardiovascular;  Laterality: N/A;   VIDEO BRONCHOSCOPY WITH ENDOBRONCHIAL NAVIGATION N/A 03/15/2021   Procedure: VIDEO BRONCHOSCOPY WITH ENDOBRONCHIAL NAVIGATION;  Surgeon:  Ottie Glazier, MD;  Location: [facility];  Service: Thoracic;  Laterality: N/A;   VIDEO BRONCHOSCOPY WITH ENDOBRONCHIAL ULTRASOUND N/A 03/15/2021   Procedure: VIDEO BRONCHOSCOPY WITH ENDOBRONCHIAL ULTRASOUND;  Surgeon: Ottie Glazier, MD;  Location: [facility];  Service: Thoracic;  Laterality: N/A;    HEMATOLOGY/ONCOLOGY HISTORY:  Oncology History Overview Note  IMPRESSION: 1. Patchy nodular fat stranding throughout the anterior left upper quadrant peritoneal fat, nonspecific, cannot exclude peritoneal carcinomatosis. Dedicated CT abdomen/pelvis with oral and IV contrast recommended for further evaluation. 2. Dense patchy consolidation replacing much of the left lower lung lobe, appearing masslike in the superior segment left lower lobe, with associated bulging of the left major fissure and associated left lower lobe volume loss. Fine nodularity throughout both lungs with an upper lobe predominance. Asymmetric left upper lobe interlobular septal thickening. These findings are indeterminate, with differential including multilobar pneumonia, sarcoidosis or a neoplastic process. The persistence on radiographs back to 01/20/2021 despite antibiotic therapy make sarcoidosis or a neoplastic process more likely. Pulmonology consultation suggested for consideration of bronchoscopic evaluation. 3. Small dependent left pleural effusion. 4. Mild mediastinal lymphadenopathy, nonspecific. 5. Subacute healing lateral right sixth rib fracture.   DIAGNOSIS:  A. LUNG, LEFT LOWER LOBE; ENB-ASSISTED BIOPSY:  - NON-SMALL CELL CARCINOMA, FAVOR ADENOCARCINOMA.  - FOREIGN MATERIAL SUGGESTIVE OF ASPIRATION.   There is sufficient material for limited ancillary studi   Cancer of lower lobe of left lung ([place])  03/23/2021 Initial Diagnosis   Cancer of lower lobe of left lung ([place])   03/29/2021 Cancer Staging   Staging form: Lung, AJCC 8th Edition - Clinical: Stage IVB (cT3, cN3, pM1c) -  Signed by [name], MD on 03/29/2021   03/30/2021 -  Chemotherapy    Patient is on Treatment Plan: LUNG NSCLC PEMETREXED (ALIMTA) / CARBOPLATIN Q21D X 1 CYCLES        ALLERGIES:  is allergic to codeine.  MEDICATIONS:  Current Outpatient Medications  Medication Sig Dispense Refill   chlorpheniramine-HYDROcodone (TUSSIONEX) 10-8 MG/5ML SUER Take 5 mLs by mouth every 12 (twelve) hours as needed for cough. 140 mL 0   dexamethasone (DECADRON) 2 MG tablet Take 1 tablet (2 mg total) by mouth 2 (two) times daily for 14 days. 28 tablet 0   folic acid (FOLVITE) 1 MG tablet Take 1 tablet (1 mg total) by mouth daily. 90 tablet 1   HYDROcodone-acetaminophen (NORCO/VICODIN) 5-325 MG tablet Take 1 tablet by mouth every 6 (six) hours as needed for moderate pain. (Patient not taking: No sig reported) 90 tablet 0   lidocaine-prilocaine (EMLA) cream Apply 1 application topically as needed. 30 g 0   LORazepam (ATIVAN) 0.5 MG tablet Take 1 tablet (0.5 mg total) by mouth every 8 (eight) hours. 30 tablet 0   metoprolol succinate (TOPROL XL) 25 MG 24 hr tablet Take 1 tablet (25 mg total) by mouth daily. 30 tablet 1   ondansetron (ZOFRAN) 8 MG tablet One pill every 8 hours as needed for nausea/vomitting. 40 tablet 1   osimertinib mesylate (TAGRISSO) 80 MG tablet Take 1 tablet (80 mg total) by mouth daily. 30 tablet 4   prochlorperazine (COMPAZINE) 10 MG tablet Take 1 tablet (10 mg total) by mouth every 6 (six) hours as needed for nausea or vomiting. 40 tablet 1   sertraline (ZOLOFT) 50 MG tablet Take 0.5 tablets (25 mg total) by mouth daily. 30 tablet 0   No current facility-administered medications for this visit.    VITAL SIGNS: There were no vitals taken for this visit. There were no vitals filed for this visit.  Estimated body mass index is 27.41 kg/m as calculated from the following:   Height as of 04/05/21: 5\' 10"  (1.778 m).   Weight as of 04/05/21: 191 lb (86.6 kg).  LABS: CBC:    Component Value Date/Time    WBC 11.0 (H) 04/05/2021 0808   HGB 15.1 04/05/2021 0808   HCT 43.1 04/05/2021 0808   PLT 201 04/05/2021 0808   MCV 88.3 04/05/2021 0808   NEUTROABS 7.4 04/05/2021 0808   LYMPHSABS 2.8 04/05/2021 0808   MONOABS 0.3 04/05/2021 0808   EOSABS 0.5 04/05/2021 0808   BASOSABS 0.0 04/05/2021 0808   Comprehensive Metabolic Panel:    Component Value Date/Time   NA 132 (L) 04/05/2021 0808   K 3.7 04/05/2021 0808   CL 99 04/05/2021 0808   CO2 26 04/05/2021 0808   BUN 15 04/05/2021 0808   CREATININE 0.63 04/05/2021 0808   GLUCOSE 108 (H) 04/05/2021 0808   CALCIUM 8.2 (L) 04/05/2021 0808   AST 41 04/05/2021 0808   ALT 107 (H) 04/05/2021 0808   ALKPHOS 335 (H) 04/05/2021 0808   BILITOT 0.8 04/05/2021 0808   PROT 6.1 (L) 04/05/2021 0808   ALBUMIN 2.7 (L) 04/05/2021 0808    RADIOGRAPHIC STUDIES: DG Chest 2 View  Result Date: 04/03/2021 CLINICAL DATA:  Hypoxia, pleural effusion and lung cancer. EXAM: CHEST - 2 VIEW COMPARISON:  03/28/2021 FINDINGS: Near complete opacification of the LEFT hemithorax is noted with interval aeration of a small portion of the LEFT UPPER lung. RIGHT lung interstitial opacities and RIGHT IJ Port-A-Cath with tip overlying the  SUPERIOR cavoatrial junction noted. There is no evidence of pneumothorax or RIGHT pleural effusion. No acute bony abnormalities are identified. IMPRESSION: Little interval change except for aeration of a small portion of the LEFT UPPER lung. Electronically Signed   By: Margarette Canada M.D.   On: 04/03/2021 10:33   MR BRAIN W WO CONTRAST  Result Date: 03/28/2021 CLINICAL DATA:  Staging for lung mass EXAM: MRI HEAD WITHOUT AND WITH CONTRAST TECHNIQUE: Multiplanar, multiecho pulse sequences of the brain and surrounding structures were obtained without and with intravenous contrast. CONTRAST:  38mL GADAVIST GADOBUTROL 1 MMOL/ML IV SOLN COMPARISON:  None. FINDINGS: Brain: There are numerous contrast-enhancing lesions throughout the brain. The largest  lesions are in the right frontal and parietal lobes. The largest right parietal lesion measures 11 mm with moderate surrounding edema. The largest right frontal lesion measures 10 mm with mild surrounding edema. There are greater than 20 lesions in total. No midline shift or other mass effect. No acute infarct. No acute hemorrhage. Ventricular sizes are normal. Vascular: Normal flow voids. Skull and upper cervical spine: Normal marrow signal. Sinuses/Orbits: Negative. Other: None. IMPRESSION: Numerous scattered intraparenchymal metastases. The largest lesions are in the right frontal and parietal lobes with moderate surrounding edema. No midline shift or other mass effect. Electronically Signed   By: Ulyses Jarred M.D.   On: 03/28/2021 22:27   PERIPHERAL VASCULAR CATHETERIZATION  Result Date: 03/29/2021 See surgical note for result.  NM PET Image Initial (PI) Skull Base To Thigh  Result Date: 03/28/2021 CLINICAL DATA:  Initial treatment strategy for non-small cell lung carcinoma. EXAM: NUCLEAR MEDICINE PET SKULL BASE TO THIGH TECHNIQUE: 10.7 mCi F-18 FDG was injected intravenously. Full-ring PET imaging was performed from the skull base to thigh after the radiotracer. CT data was obtained and used for attenuation correction and anatomic localization. Fasting blood glucose: 90 mg/dl COMPARISON:  03/15/2021 FINDINGS: Mediastinal blood-pool activity (background): SUV max = 2.6 Liver activity (reference): SUV max = N/A NECK: 6 mm left supraclavicular lymph node on image 41/6 is hypermetabolic, with SUV max of 3.6. Incidental CT findings:  None. CHEST: Left lung collapse is seen with a large hypermetabolic masslike opacity in the perihilar region and left lower lobe, which measures approximately 7.0 x 5.8 cm on image 101/3 and has SUV max of 17.5. Hypermetabolic activity in the collapsed left upper lobe is likely secondary to atelectasis. Large left pleural effusion causes mediastinal shift to the right and shows  pleural base areas mild FDG uptake, highly suspicious for malignant pleural effusion. A 10 mm subcarinal lymph node shows FDG uptake with SUV max of 3.9. A 10 mm right paratracheal lymph node shows FDG uptake with SUV max of 3.9. Incidental CT findings:  None. ABDOMEN/PELVIS: No abnormal hypermetabolic activity within the liver, pancreas, adrenal glands, or spleen. 1.7 cm hypermetabolic soft tissue density in the splenic hilum has SUV max of 6.2, and may represent a hypermetabolic lymph node or peritoneal implant. Ill-defined soft tissue density in the left upper quadrant omental fat is hypermetabolic with SUV max of 4.5, consistent with omental carcinomatosis. Hypermetabolic activity is seen involving the peritoneum in dependent pelvis, also suspicious for peritoneal carcinomatosis. No other hypermetabolic soft tissue masses or lymphadenopathy identified. Incidental CT findings:  None. SKELETON: Numerous hypermetabolic bone metastases are seen in the bilateral scapulae, bilateral ribs, thoracic and lumbosacral spine, and pelvis, most of which are lytic in appearance. Incidental CT findings:  None. IMPRESSION: Left lung collapse with 7 cm hypermetabolic masslike opacity centered in  the left lower lobe and involving the left hilum, consistent with primary bronchogenic carcinoma. Large malignant left pleural effusion. Hypermetabolic lymphadenopathy in mediastinum and left supraclavicular region. Peritoneal/omental carcinomatosis. Diffuse bone metastases. Electronically Signed   By: Marlaine Hind M.D.   On: 03/28/2021 09:59   DG Chest Port 1 View  Result Date: 04/04/2021 CLINICAL DATA:  40 year old male with a history of left-sided pleural effusion EXAM: PORTABLE CHEST 1 VIEW COMPARISON:  04/03/2021 FINDINGS: Cardiomediastinal silhouette likely unchanged and partially obstructed by overlying lung/pleural disease. Persistent opacity of the left chest with partially aerated left upper lung. No pneumothorax. No  air-fluid level. Unchanged appearance of the right lung with mild interlobular septal thickening/reticular opacities. No new confluent airspace disease. Unchanged right IJ port catheter. IMPRESSION: No complicating features status post left thoracentesis, with persisting pleural fluid and lung consolidation/atelectasis. Electronically Signed   By: Corrie Mckusick D.O.   On: 04/04/2021 15:56   DG Chest Port 1 View  Result Date: 03/28/2021 CLINICAL DATA:  Pleural effusion left, s/p thora EXAM: PORTABLE CHEST - 1 VIEW COMPARISON:  03/27/2021 PET-CT FINDINGS: No pneumothorax. Near complete opacification of left hemithorax with central air bronchograms. Heart size difficult to assess due to adjacent opacities. On the right, diffuse pulmonary vascular congestion. Lytic lesions in bilateral ribs are identified, with probable pathologic fracture involving the right sixth rib. IMPRESSION: 1. No pneumothorax post left thoracentesis, with persistent opacification of the left hemithorax. Electronically Signed   By: Lucrezia Europe M.D.   On: 03/28/2021 14:11   US THORACENTESIS ASP PLEURAL SPACE W/IMG GUIDE  Result Date: 04/04/2021 INDICATION: Patient with history of non-small cell lung cancer and recurrent left pleural effusion presents today for therapeutic thoracentesis. EXAM: ULTRASOUND GUIDED THORACENTESIS MEDICATIONS: 1% lidocaine 10 mL COMPLICATIONS: None immediate. PROCEDURE: An ultrasound guided thoracentesis was thoroughly discussed with the patient and questions answered. The benefits, risks, alternatives and complications were also discussed. The patient understands and wishes to proceed with the procedure. Written consent was obtained. Ultrasound was performed to localize and mark an adequate pocket of fluid in the left chest. The area was then prepped and draped in the normal sterile fashion. 1% Lidocaine was used for local anesthesia. Under ultrasound guidance a 6 Fr Safe-T-Centesis catheter was introduced.  Thoracentesis was performed. The catheter was removed and a dressing applied. FINDINGS: A total of approximately 1.3 L of amber-colored fluid was removed. IMPRESSION: Successful ultrasound guided left thoracentesis yielding 1.3 L of pleural fluid. Read by: Soyla Dryer, NP Electronically Signed   By: Corrie Mckusick D.O.   On: 04/04/2021 15:56   US THORACENTESIS ASP PLEURAL SPACE W/IMG GUIDE  Result Date: 03/28/2021 INDICATION: Non-small cell lung carcinoma, metastatic.  Left pleural effusion. EXAM: ULTRASOUND GUIDED LEFT THORACENTESIS MEDICATIONS: Lidocaine 1% subcutaneous COMPLICATIONS: None immediate.  No pneumothorax on follow-up radiograph. PROCEDURE: An ultrasound guided thoracentesis was thoroughly discussed with the patient and questions answered. The benefits, risks, alternatives and complications were also discussed. The patient understands and wishes to proceed with the procedure. Written consent was obtained. Ultrasound was performed to localize and mark an adequate pocket of fluid in the left chest. The area was then prepped and draped in the normal sterile fashion. 1% Lidocaine was used for local anesthesia. Under ultrasound guidance a 6 Fr Safe-T-Centesis catheter was introduced. Thoracentesis was performed. The catheter was removed and a dressing applied. FINDINGS: A total of approximately 1.2 L of clear yellow fluid was removed. IMPRESSION: Successful ultrasound guided left thoracentesis yielding 1.2 L of pleural fluid.  Electronically Signed   By: Lucrezia Europe M.D.   On: 03/28/2021 14:38    PERFORMANCE STATUS (ECOG) : 1 - Symptomatic but completely ambulatory  Review of Systems Unless otherwise noted, a complete review of systems is negative.  Physical Exam General: NAD Cardiovascular: regular rate and rhythm Pulmonary: Clear anterior/posterior fields Abdomen: soft, nontender, + bowel sounds GU: no suprapubic tenderness Extremities: no edema, no joint deformities, pain to palpation  of right plantar fascia with dorsiflexion of the toes, negative Homans' sign Skin: no rashes Neurological: Weakness but otherwise nonfocal  Assessment and Plan- Patient is a 40 y.o. 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  DVT -lower extremity ultrasound revealed occlusive deep vein thrombus in posterior tibial vein.  Discussed with Dr. Rogue Bussing and will start Eliquis.  Given higher risk due to brain mets, Dr. Rogue Bussing would like patient started on 5 mg twice daily instead of higher starter dose.  Patient given 30-day trial and copay cards.  Discussed importance of monitoring for bleeding and avoiding falls. Patient sent home with education printout.   R. Foot pain -pain seems to be localized to plantar fascia and is present with dorsiflexion of toes.  Suspect component of plantar fasciitis.  Patient is currently managed on steroid with twice daily dexamethasone. Avoid stretching exercises in light of DVT until cleared by Dr. Rogue Bussing who he sees later this week.   Case and plan discussed with Dr. Rogue Bussing.  Patient to follow-up with Dr. Rogue Bussing on 04/20/2021   Patient expressed understanding and was in agreement with this plan. He also understands that He can call clinic at any time with any questions, concerns, or complaints.   Thank you for allowing me to participate in the care of this very pleasant patient.   Time Total: 25 minutes  Visit consisted of counseling and education dealing with the complex and emotionally intense issues of symptom management and palliative care in the setting of serious and potentially life-threatening illness.Greater than 50%  of this time was spent counseling and coordinating care related to the above assessment and plan.  Signed by: Altha Harm, PhD,  NP-C

## 2021-04-18 NOTE — Telephone Encounter (Signed)
Oral Oncology Patient Advocate Encounter   Received notification from Christella Scheuermann that prior authorization for Eliquis is required.   PA submitted on CoverMyMeds Key [id number]  Status is pending   Oral Oncology Clinic will continue to follow.  [place] Patient [place] Phone [phone] Fax [phone] 04/18/2021 3:24 PM

## 2021-04-20 ENCOUNTER — Inpatient Hospital Stay: Payer: Managed Care, Other (non HMO) | Admitting: Pharmacist

## 2021-04-20 ENCOUNTER — Inpatient Hospital Stay: Payer: Managed Care, Other (non HMO)

## 2021-04-20 ENCOUNTER — Other Ambulatory Visit: Payer: Self-pay

## 2021-04-20 ENCOUNTER — Inpatient Hospital Stay: Payer: Managed Care, Other (non HMO) | Admitting: Hospice and Palliative Medicine

## 2021-04-20 ENCOUNTER — Inpatient Hospital Stay (HOSPITAL_BASED_OUTPATIENT_CLINIC_OR_DEPARTMENT_OTHER): Payer: Managed Care, Other (non HMO) | Admitting: Internal Medicine

## 2021-04-20 ENCOUNTER — Encounter: Payer: Self-pay | Admitting: *Deleted

## 2021-04-20 DIAGNOSIS — C3432 Malignant neoplasm of lower lobe, left bronchus or lung: Secondary | ICD-10-CM

## 2021-04-20 DIAGNOSIS — C7931 Secondary malignant neoplasm of brain: Secondary | ICD-10-CM | POA: Diagnosis not present

## 2021-04-20 DIAGNOSIS — I82441 Acute embolism and thrombosis of right tibial vein: Secondary | ICD-10-CM | POA: Diagnosis not present

## 2021-04-20 DIAGNOSIS — Z452 Encounter for adjustment and management of vascular access device: Secondary | ICD-10-CM | POA: Diagnosis not present

## 2021-04-20 DIAGNOSIS — C3492 Malignant neoplasm of unspecified part of left bronchus or lung: Secondary | ICD-10-CM

## 2021-04-20 DIAGNOSIS — Z7901 Long term (current) use of anticoagulants: Secondary | ICD-10-CM | POA: Diagnosis not present

## 2021-04-20 DIAGNOSIS — C7951 Secondary malignant neoplasm of bone: Secondary | ICD-10-CM | POA: Diagnosis not present

## 2021-04-20 LAB — CBC WITH DIFFERENTIAL/PLATELET
Abs Immature Granulocytes: 1.2 10*3/uL — ABNORMAL HIGH (ref 0.00–0.07)
Band Neutrophils: 4 %
Basophils Absolute: 0 10*3/uL (ref 0.0–0.1)
Basophils Relative: 0 %
Eosinophils Absolute: 0.2 10*3/uL (ref 0.0–0.5)
Eosinophils Relative: 1 %
HCT: 42.8 % (ref 39.0–52.0)
Hemoglobin: 14.2 g/dL (ref 13.0–17.0)
Lymphocytes Relative: 30 %
Lymphs Abs: 4.5 10*3/uL — ABNORMAL HIGH (ref 0.7–4.0)
MCH: 30.7 pg (ref 26.0–34.0)
MCHC: 33.2 g/dL (ref 30.0–36.0)
MCV: 92.6 fL (ref 80.0–100.0)
Metamyelocytes Relative: 1 %
Monocytes Absolute: 1.5 10*3/uL — ABNORMAL HIGH (ref 0.1–1.0)
Monocytes Relative: 10 %
Myelocytes: 7 %
Neutro Abs: 7.7 10*3/uL (ref 1.7–7.7)
Neutrophils Relative %: 47 %
Platelets: 205 10*3/uL (ref 150–400)
RBC: 4.62 MIL/uL (ref 4.22–5.81)
RDW: 13.4 % (ref 11.5–15.5)
Smear Review: NORMAL
WBC: 15 10*3/uL — ABNORMAL HIGH (ref 4.0–10.5)
nRBC: 0 % (ref 0.0–0.2)

## 2021-04-20 LAB — COMPREHENSIVE METABOLIC PANEL
ALT: 89 U/L — ABNORMAL HIGH (ref 0–44)
AST: 27 U/L (ref 15–41)
Albumin: 2.9 g/dL — ABNORMAL LOW (ref 3.5–5.0)
Alkaline Phosphatase: 292 U/L — ABNORMAL HIGH (ref 38–126)
Anion gap: 7 (ref 5–15)
BUN: 16 mg/dL (ref 6–20)
CO2: 24 mmol/L (ref 22–32)
Calcium: 8.4 mg/dL — ABNORMAL LOW (ref 8.9–10.3)
Chloride: 105 mmol/L (ref 98–111)
Creatinine, Ser: 0.79 mg/dL (ref 0.61–1.24)
GFR, Estimated: >60 mL/min (ref >60)
Glucose, Bld: 93 mg/dL (ref 70–99)
Potassium: 3.9 mmol/L (ref 3.5–5.1)
Sodium: 136 mmol/L (ref 135–145)
Total Bilirubin: 0.5 mg/dL (ref 0.3–1.2)
Total Protein: 6 g/dL — ABNORMAL LOW (ref 6.5–8.1)

## 2021-04-20 MED ORDER — DEXAMETHASONE 2 MG PO TABS: 2.0000 mg | ORAL_TABLET | Freq: Two times a day (BID) | ORAL | 0 refills | Status: DC

## 2021-04-20 NOTE — Progress Notes (Signed)
Having pain in right foot which he contributes to the the blood clot in his right lower leg.

## 2021-04-20 NOTE — Patient Instructions (Addendum)
#  Apply Voltaren gel topically twice a day on the affected foot.  #Apply urea cream 40% twice a day bilaterally on the soles  # calcium  [500 mg]+ Vit D [800] BID.

## 2021-04-20 NOTE — Progress Notes (Signed)
[facility] CONSULT NOTE  Patient Care Team: Pcp, No as PCP - General [name], RN as Oncology Nurse Navigator  CHIEF COMPLAINTS/PURPOSE OF CONSULTATION: Lung cancer  #  Oncology History Overview Note  IMPRESSION: 1. Patchy nodular fat stranding throughout the anterior left upper quadrant peritoneal fat, nonspecific, cannot exclude peritoneal carcinomatosis. Dedicated CT abdomen/pelvis with oral and IV contrast recommended for further evaluation. 2. Dense patchy consolidation replacing much of the left lower lung lobe, appearing masslike in the superior segment left lower lobe, with associated bulging of the left major fissure and associated left lower lobe volume loss. Fine nodularity throughout both lungs with an upper lobe predominance. Asymmetric left upper lobe interlobular septal thickening. These findings are indeterminate, with differential including multilobar pneumonia, sarcoidosis or a neoplastic process. The persistence on radiographs back to 01/20/2021 despite antibiotic therapy make sarcoidosis or a neoplastic process more likely. Pulmonology consultation suggested for consideration of bronchoscopic evaluation. 3. Small dependent left pleural effusion. 4. Mild mediastinal lymphadenopathy, nonspecific. 5. Subacute healing lateral right sixth rib fracture.   DIAGNOSIS:  A. LUNG, LEFT LOWER LOBE; ENB-ASSISTED BIOPSY:  - NON-SMALL CELL CARCINOMA, FAVOR ADENOCARCINOMA.  - FOREIGN MATERIAL SUGGESTIVE OF ASPIRATION.     # EGFR MUTATED: 28 del- June 28th, 2022- [name].    Cancer of lower lobe of left lung ([place])  03/23/2021 Initial Diagnosis   Cancer of lower lobe of left lung ([place])   03/29/2021 Cancer Staging   Staging form: Lung, AJCC 8th Edition - Clinical: Stage IVB (cT3, cN3, pM1c) - Signed by [name], MD on 03/29/2021    03/30/2021 -  Chemotherapy    Patient is on Treatment Plan: LUNG NSCLC PEMETREXED (ALIMTA) /  CARBOPLATIN Q21D X 1 CYCLES          HISTORY OF PRESENTING ILLNESS:  [name] 40 y.o.  male patient with stage IV lung cancer adenocarcinoma  EGFR mutated on osimertinib is here for follow-up.    Patient in the interim was evaluated by symptom management clinic for right foot pain/lower extremity discomfort.  Patient had ultrasound that showed occlusive below the knee DVT.  Patient is currently on Eliquis.  Denies any worsening shortness of breath or cough.  He continues to use oxygen only as needed.  Patient has been going to work/at the mill.  Denies any chest pain.  Denies any headaches.   Review of Systems  Constitutional:  Positive for malaise/fatigue and weight loss. Negative for chills, diaphoresis and fever.  HENT:  Negative for nosebleeds and sore throat.   Eyes:  Negative for double vision.  Respiratory:  Positive for shortness of breath. Negative for wheezing.   Cardiovascular:  Negative for chest pain, palpitations, orthopnea and leg swelling.  Gastrointestinal:  Negative for abdominal pain, blood in stool, diarrhea, heartburn, melena, nausea and vomiting.  Genitourinary:  Negative for dysuria, frequency and urgency.  Musculoskeletal:  Negative for back pain and joint pain.  Skin: Negative.  Negative for itching and rash.  Neurological:  Negative for dizziness, tingling, focal weakness, weakness and headaches.  Endo/Heme/Allergies:  Does not bruise/bleed easily.  Psychiatric/Behavioral:  Negative for depression. The patient is not nervous/anxious and does not have insomnia.     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    SURGICAL HISTORY: Past Surgical History:  Procedure  Laterality Date   FRACTURE SURGERY     broke femur when he was 6 years   PORTA CATH INSERTION N/A 03/28/2021   Procedure: PORTA CATH INSERTION;  Surgeon:  Katha Cabal, MD;  Location: [facility];  Service: Cardiovascular;  Laterality: N/A;   VIDEO BRONCHOSCOPY WITH ENDOBRONCHIAL NAVIGATION N/A 03/15/2021   Procedure: VIDEO BRONCHOSCOPY WITH ENDOBRONCHIAL NAVIGATION;  Surgeon: Ottie Glazier, MD;  Location: [facility];  Service: Thoracic;  Laterality: N/A;   VIDEO BRONCHOSCOPY WITH ENDOBRONCHIAL ULTRASOUND N/A 03/15/2021   Procedure: VIDEO BRONCHOSCOPY WITH ENDOBRONCHIAL ULTRASOUND;  Surgeon: Ottie Glazier, MD;  Location: [facility];  Service: Thoracic;  Laterality: N/A;    SOCIAL HISTORY: Social History   Socioeconomic History   Marital status: Married    Spouse name: [name]   Number of children: 2   Years of education: Not on file   Highest education level: Not on file  Occupational History   Not on file  Tobacco Use   Smoking status: Never   Smokeless tobacco: Current    Types: Snuff, Chew  Vaping Use   Vaping Use: Never used  Substance and Sexual Activity   Alcohol use: Never   Drug use: Never   Sexual activity: Not on file  Other Topics Concern   Not on file  Social History Narrative   [place]; with wife; 2 daughters[12 and 22]; never smoked; rare alcohol. Work in saw [employer]    Social Determinants of Health   Financial Resource Strain: Not on file  Food Insecurity: Not on file  Transportation Needs: Not on file  Physical Activity: Not on file  Stress: Not on file  Social Connections: Not on file  Intimate Partner Violence: Not on file    FAMILY HISTORY: Family History  Problem Relation Age of Onset   High Cholesterol Mother    Hypertension Father    Cancer Father    Lung cancer Father    Skin cancer Father     ALLERGIES:  is allergic to codeine.  MEDICATIONS:  Current Outpatient Medications  Medication Sig Dispense Refill   apixaban (ELIQUIS) 5 MG TABS tablet Take 1 tablet (32m) twice daily 60 tablet 2   chlorpheniramine-HYDROcodone (TUSSIONEX) 10-8 MG/5ML SUER Take 5 mLs by mouth  every 12 (twelve) hours as needed for cough. 1448mL 0   folic acid (FOLVITE) 1 MG tablet Take 1 tablet (1 mg total) by mouth daily. 90 tablet 1   lidocaine-prilocaine (EMLA) cream Apply 1 application topically as needed. 30 g 0   metoprolol succinate (TOPROL XL) 25 MG 24 hr tablet Take 1 tablet (25 mg total) by mouth daily. 30 tablet 1   ondansetron (ZOFRAN) 8 MG tablet One pill every 8 hours as needed for nausea/vomitting. 40 tablet 1   osimertinib mesylate (TAGRISSO) 80 MG tablet Take 1 tablet (80 mg total) by mouth daily. 30 tablet 4   prochlorperazine (COMPAZINE) 10 MG tablet Take 1 tablet (10 mg total) by mouth every 6 (six) hours as needed for nausea or vomiting. 40 tablet 1   sertraline (ZOLOFT) 50 MG tablet Take 0.5 tablets (25 mg total) by mouth daily. 30 tablet 0   dexamethasone (DECADRON) 2 MG tablet Take 1 tablet (2 mg total) by mouth 2 (two) times daily. 60 tablet 0   LORazepam (ATIVAN) 0.5 MG tablet Take 1 tablet (0.5 mg total) by mouth every 8 (eight) hours. (Patient not taking: Reported on 04/20/2021) 30 tablet 0   No current facility-administered medications  for this visit.      Marland Kitchen  PHYSICAL EXAMINATION: ECOG PERFORMANCE STATUS: 1 - Symptomatic but completely ambulatory  Vitals:   04/20/21 0845  BP: 109/77  Pulse: 70  Resp: 16  Temp: (!) 96.6 F (35.9 C)  SpO2: 96%   Filed Weights   04/20/21 0845  Weight: 200 lb (90.7 kg)    Physical Exam Constitutional:      Comments: Patient is wheelchair. With his wife.  HENT:     Head: Normocephalic and atraumatic.     Mouth/Throat:     Pharynx: No oropharyngeal exudate.  Eyes:     Pupils: Pupils are equal, round, and reactive to light.  Cardiovascular:     Rate and Rhythm: Normal rate and regular rhythm.  Pulmonary:     Effort: No respiratory distress.     Breath sounds: No wheezing.     Comments: Decreased breath sound on the left side compared to right. Abdominal:     General: Bowel sounds are normal. There is no  distension.     Palpations: Abdomen is soft. There is no mass.     Tenderness: There is no abdominal tenderness. There is no guarding or rebound.  Musculoskeletal:        General: No tenderness. Normal range of motion.     Cervical back: Normal range of motion and neck supple.  Skin:    General: Skin is warm.     Comments: Extreme dry skin/calluses of bilateral lower extremities.  Otherwise no obvious lesions in the foot noted.   Neurological:     Mental Status: He is alert and oriented to person, place, and time.  Psychiatric:        Mood and Affect: Affect normal.     LABORATORY DATA:  I have reviewed the data as listed Lab Results  Component Value Date   WBC 15.0 (H) 04/20/2021   HGB 14.2 04/20/2021   HCT 42.8 04/20/2021   MCV 92.6 04/20/2021   PLT 205 04/20/2021   Recent Labs    04/03/21 1043 04/05/21 0808 04/20/21 0828  NA 129* 132* 136  K 3.8 3.7 3.9  CL 94* 99 105  CO2 _0 GLUCOSE 96 108* 93  BUN _1 CREATININE 0.67 0.63 0.79  CALCIUM 8.2* 8.2* 8.4*  GFRNONAA >60 >60 >60  PROT 6.2* 6.1* 6.0*  ALBUMIN 2.8* 2.7* 2.9*  AST 55* 41 27  ALT 143* 107* 89*  ALKPHOS 394* 335* 292*  BILITOT 0.9 0.8 0.5    RADIOGRAPHIC STUDIES: I have personally reviewed the radiological images as listed and agreed with the findings in the report. DG Chest 2 View  Result Date: 04/03/2021 CLINICAL DATA:  Hypoxia, pleural effusion and lung cancer. EXAM: CHEST - 2 VIEW COMPARISON:  03/28/2021 FINDINGS: Near complete opacification of the LEFT hemithorax is noted with interval aeration of a small portion of the LEFT UPPER lung. RIGHT lung interstitial opacities and RIGHT IJ Port-A-Cath with tip overlying the SUPERIOR cavoatrial junction noted. There is no evidence of pneumothorax or RIGHT pleural effusion. No acute bony abnormalities are identified. IMPRESSION: Little interval change except for aeration of a small portion of the LEFT UPPER lung. Electronically Signed   By:  Margarette Canada M.D.   On: 04/03/2021 10:33   MR BRAIN W WO CONTRAST  Result Date: 03/28/2021 CLINICAL DATA:  Staging for lung mass EXAM: MRI HEAD WITHOUT AND WITH CONTRAST TECHNIQUE: Multiplanar, multiecho pulse sequences of the brain and surrounding structures  were obtained without and with intravenous contrast. CONTRAST:  9mL GADAVIST GADOBUTROL 1 MMOL/ML IV SOLN COMPARISON:  None. FINDINGS: Brain: There are numerous contrast-enhancing lesions throughout the brain. The largest lesions are in the right frontal and parietal lobes. The largest right parietal lesion measures 11 mm with moderate surrounding edema. The largest right frontal lesion measures 10 mm with mild surrounding edema. There are greater than 20 lesions in total. No midline shift or other mass effect. No acute infarct. No acute hemorrhage. Ventricular sizes are normal. Vascular: Normal flow voids. Skull and upper cervical spine: Normal marrow signal. Sinuses/Orbits: Negative. Other: None. IMPRESSION: Numerous scattered intraparenchymal metastases. The largest lesions are in the right frontal and parietal lobes with moderate surrounding edema. No midline shift or other mass effect. Electronically Signed   By: Ulyses Jarred M.D.   On: 03/28/2021 22:27   PERIPHERAL VASCULAR CATHETERIZATION  Result Date: 03/29/2021 See surgical note for result.  NM PET Image Initial (PI) Skull Base To Thigh  Result Date: 03/28/2021 CLINICAL DATA:  Initial treatment strategy for non-small cell lung carcinoma. EXAM: NUCLEAR MEDICINE PET SKULL BASE TO THIGH TECHNIQUE: 10.7 mCi F-18 FDG was injected intravenously. Full-ring PET imaging was performed from the skull base to thigh after the radiotracer. CT data was obtained and used for attenuation correction and anatomic localization. Fasting blood glucose: 90 mg/dl COMPARISON:  03/15/2021 FINDINGS: Mediastinal blood-pool activity (background): SUV max = 2.6 Liver activity (reference): SUV max = N/A NECK: 6 mm left  supraclavicular lymph node on image 91/4 is hypermetabolic, with SUV max of 3.6. Incidental CT findings:  None. CHEST: Left lung collapse is seen with a large hypermetabolic masslike opacity in the perihilar region and left lower lobe, which measures approximately 7.0 x 5.8 cm on image 101/3 and has SUV max of 17.5. Hypermetabolic activity in the collapsed left upper lobe is likely secondary to atelectasis. Large left pleural effusion causes mediastinal shift to the right and shows pleural base areas mild FDG uptake, highly suspicious for malignant pleural effusion. A 10 mm subcarinal lymph node shows FDG uptake with SUV max of 3.9. A 10 mm right paratracheal lymph node shows FDG uptake with SUV max of 3.9. Incidental CT findings:  None. ABDOMEN/PELVIS: No abnormal hypermetabolic activity within the liver, pancreas, adrenal glands, or spleen. 1.7 cm hypermetabolic soft tissue density in the splenic hilum has SUV max of 6.2, and may represent a hypermetabolic lymph node or peritoneal implant. Ill-defined soft tissue density in the left upper quadrant omental fat is hypermetabolic with SUV max of 4.5, consistent with omental carcinomatosis. Hypermetabolic activity is seen involving the peritoneum in dependent pelvis, also suspicious for peritoneal carcinomatosis. No other hypermetabolic soft tissue masses or lymphadenopathy identified. Incidental CT findings:  None. SKELETON: Numerous hypermetabolic bone metastases are seen in the bilateral scapulae, bilateral ribs, thoracic and lumbosacral spine, and pelvis, most of which are lytic in appearance. Incidental CT findings:  None. IMPRESSION: Left lung collapse with 7 cm hypermetabolic masslike opacity centered in the left lower lobe and involving the left hilum, consistent with primary bronchogenic carcinoma. Large malignant left pleural effusion. Hypermetabolic lymphadenopathy in mediastinum and left supraclavicular region. Peritoneal/omental carcinomatosis. Diffuse  bone metastases. Electronically Signed   By: Marlaine Hind M.D.   On: 03/28/2021 09:59   US Venous Img Lower Unilateral Right  Result Date: 04/18/2021 CLINICAL DATA:  Right foot pain for the past 2 days. History of lung cancer. Evaluate for DVT. EXAM: RIGHT LOWER EXTREMITY VENOUS DOPPLER ULTRASOUND TECHNIQUE: Gray-scale sonography  with graded compression, as well as color Doppler and duplex ultrasound were performed to evaluate the lower extremity deep venous systems from the level of the common femoral vein and including the common femoral, femoral, profunda femoral, popliteal and calf veins including the posterior tibial, peroneal and gastrocnemius veins when visible. The superficial great saphenous vein was also interrogated. Spectral Doppler was utilized to evaluate flow at rest and with distal augmentation maneuvers in the common femoral, femoral and popliteal veins. COMPARISON:  None. FINDINGS: Contralateral Common Femoral Vein: Respiratory phasicity is normal and symmetric with the symptomatic side. No evidence of thrombus. Normal compressibility. Common Femoral Vein: No evidence of thrombus. Normal compressibility, respiratory phasicity and response to augmentation. Saphenofemoral Junction: No evidence of thrombus. Normal compressibility and flow on color Doppler imaging. Profunda Femoral Vein: No evidence of thrombus. Normal compressibility and flow on color Doppler imaging. Femoral Vein: No evidence of thrombus. Normal compressibility, respiratory phasicity and response to augmentation. Popliteal Vein: No evidence of thrombus. Normal compressibility, respiratory phasicity and response to augmentation. Calf Veins: There is hypoechoic occlusive thrombus involving one of the paired right posterior tibial veins (images 32 and 33). The paired right peroneal veins appear patent where imaged. Superficial Great Saphenous Vein: No evidence of thrombus. Normal compressibility. Other Findings:  None. IMPRESSION:  The examination is positive for occlusive DVT involving one of the paired right posterior tibial veins. There is no extension of this distal occlusive tibial DVT to the more proximal venous system of the right lower extremity. Electronically Signed   By: Sandi Mariscal M.D.   On: 04/18/2021 14:18   DG Chest Port 1 View  Result Date: 04/04/2021 CLINICAL DATA:  40 year old male with a history of left-sided pleural effusion EXAM: PORTABLE CHEST 1 VIEW COMPARISON:  04/03/2021 FINDINGS: Cardiomediastinal silhouette likely unchanged and partially obstructed by overlying lung/pleural disease. Persistent opacity of the left chest with partially aerated left upper lung. No pneumothorax. No air-fluid level. Unchanged appearance of the right lung with mild interlobular septal thickening/reticular opacities. No new confluent airspace disease. Unchanged right IJ port catheter. IMPRESSION: No complicating features status post left thoracentesis, with persisting pleural fluid and lung consolidation/atelectasis. Electronically Signed   By: Corrie Mckusick D.O.   On: 04/04/2021 15:56   DG Chest Port 1 View  Result Date: 03/28/2021 CLINICAL DATA:  Pleural effusion left, s/p thora EXAM: PORTABLE CHEST - 1 VIEW COMPARISON:  03/27/2021 PET-CT FINDINGS: No pneumothorax. Near complete opacification of left hemithorax with central air bronchograms. Heart size difficult to assess due to adjacent opacities. On the right, diffuse pulmonary vascular congestion. Lytic lesions in bilateral ribs are identified, with probable pathologic fracture involving the right sixth rib. IMPRESSION: 1. No pneumothorax post left thoracentesis, with persistent opacification of the left hemithorax. Electronically Signed   By: Lucrezia Europe M.D.   On: 03/28/2021 14:11   US THORACENTESIS ASP PLEURAL SPACE W/IMG GUIDE  Result Date: 04/04/2021 INDICATION: Patient with history of non-small cell lung cancer and recurrent left pleural effusion presents today for  therapeutic thoracentesis. EXAM: ULTRASOUND GUIDED THORACENTESIS MEDICATIONS: 1% lidocaine 10 mL COMPLICATIONS: None immediate. PROCEDURE: An ultrasound guided thoracentesis was thoroughly discussed with the patient and questions answered. The benefits, risks, alternatives and complications were also discussed. The patient understands and wishes to proceed with the procedure. Written consent was obtained. Ultrasound was performed to localize and mark an adequate pocket of fluid in the left chest. The area was then prepped and draped in the normal sterile fashion. 1% Lidocaine was used  for local anesthesia. Under ultrasound guidance a 6 Fr Safe-T-Centesis catheter was introduced. Thoracentesis was performed. The catheter was removed and a dressing applied. FINDINGS: A total of approximately 1.3 L of amber-colored fluid was removed. IMPRESSION: Successful ultrasound guided left thoracentesis yielding 1.3 L of pleural fluid. Read by: Soyla Dryer, NP Electronically Signed   By: Corrie Mckusick D.O.   On: 04/04/2021 15:56   US THORACENTESIS ASP PLEURAL SPACE W/IMG GUIDE  Result Date: 03/28/2021 INDICATION: Non-small cell lung carcinoma, metastatic.  Left pleural effusion. EXAM: ULTRASOUND GUIDED LEFT THORACENTESIS MEDICATIONS: Lidocaine 1% subcutaneous COMPLICATIONS: None immediate.  No pneumothorax on follow-up radiograph. PROCEDURE: An ultrasound guided thoracentesis was thoroughly discussed with the patient and questions answered. The benefits, risks, alternatives and complications were also discussed. The patient understands and wishes to proceed with the procedure. Written consent was obtained. Ultrasound was performed to localize and mark an adequate pocket of fluid in the left chest. The area was then prepped and draped in the normal sterile fashion. 1% Lidocaine was used for local anesthesia. Under ultrasound guidance a 6 Fr Safe-T-Centesis catheter was introduced. Thoracentesis was performed. The catheter  was removed and a dressing applied. FINDINGS: A total of approximately 1.2 L of clear yellow fluid was removed. IMPRESSION: Successful ultrasound guided left thoracentesis yielding 1.2 L of pleural fluid. Electronically Signed   By: Lucrezia Europe M.D.   On: 03/28/2021 14:38    ASSESSMENT & PLAN:   Cancer of lower lobe of left lung ([place]) # STAGE IV- Left upper lobe lung cancer-adenocarcinoma the lung- PET June 2022- Left lung collapse with 7 cm hypermetabolic masslike opacity centered in the left lower lobe and involving the left hilum; Large malignant left pleural effusion;  Hypermetabolic lymphadenopathy in mediastinum and left supraclavicular region. Peritoneal/omental carcinomatosis; Diffuse bone metastases.  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  EGFR- 19 del POSITIVE.  # Patient status post Norma Fredrickson Alimta 3 weeks ago pending EGFR results.  Patient has been on osimertinib 80 mg a day for the last 2 weeks.  Patient tolerating well.  No clinical signs of worsening.  #Multiple brain mets subcentimeter asymptomatic-  Dexamethasone 2 mg twice daily. Monitor closely- will plan we will plan repeat brain MRI the next 4 to 6 weeks.  # ?Symptomatic DVT of the right calf [presented with symptoms of right foot pain/incidental]-recommend continued anticoagulation for milligrams twice daily given the active malignancy.  # Right foot pain-unclear etiology suspect musculoskeletal rather than malignancy related.  Recommend Voltaren gel.;  Calluses bilateral feet-recommend 40% urea cream.  #Bone metastases-recommend Zometa; discussed regarding hypercalcemia/calcium supplementation.  Discussed regarding risk of osteonecrosis of the jaw-however benefits outweigh the risk at this time.  # Cough/chest pain/anxiety--secondary to malignancy; continue Tussionex. prn  #Cardiac arrhythmia-SVT s/p adenosine post thoracentesis [June 2022]; Hx of WPW-STABLE>   # DISPOSITION: # follow up in 2 week-MD;  labs- cbc/cmp; Zometa-Dr.B       All questions were answered. The patient knows to call the clinic with any problems, questions or concerns.    [name], MD 04/23/2021 10:58 PM

## 2021-04-20 NOTE — Progress Notes (Signed)
Met with patient and his wife during follow up visit with Dr. Rogue Bussing. All questions answered during visit. Nothing further needed at this time. Instructed pt and his wife to call with any further questions or needs. Pt verbalized understanding.

## 2021-04-20 NOTE — Progress Notes (Signed)
[name]  [phone] [phone]  Patient Care Team: Pcp, No as PCP - General Nathan Nab, RN as Oncology Nurse Navigator   Name of the patient: [name]  [id number]  1980-11-05   Date of visit: 04/20/21  HPI: Patient is a 40 y.o. male with with newly diagnosed metastatic NSCLC. Patient received one cycle of carboplatin and pemetrexed, then was found to have an EGFR deletion 19 mutation. His treatment was changed to [place] (osimertinib), which started on 04/11/21.  Reason for Consult: Oral chemotherapy follow-up for osimertinib therapy.   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 Patchy nodular fat stranding throughout the anterior left upper quadrant peritoneal fat, nonspecific, cannot exclude peritoneal carcinomatosis. Dedicated CT abdomen/pelvis with oral and IV contrast recommended for further evaluation. 2. Dense patchy consolidation replacing much of the left lower lung lobe, appearing masslike in the superior segment left lower lobe, with associated bulging of the left major fissure and associated left lower lobe volume loss. Fine nodularity throughout both lungs with an upper lobe predominance. Asymmetric left upper lobe interlobular septal thickening. These findings are indeterminate, with differential including multilobar pneumonia, sarcoidosis or a neoplastic process. The persistence on radiographs back to 01/20/2021 despite antibiotic therapy make sarcoidosis or a neoplastic process more likely. Pulmonology consultation suggested for consideration of bronchoscopic evaluation. 3. Small dependent left pleural effusion. 4. Mild mediastinal lymphadenopathy, nonspecific. 5.  Subacute healing lateral right sixth rib fracture.   DIAGNOSIS:  A. LUNG, LEFT LOWER LOBE; ENB-ASSISTED BIOPSY:  - NON-SMALL CELL CARCINOMA, FAVOR ADENOCARCINOMA.  - FOREIGN MATERIAL SUGGESTIVE OF ASPIRATION.     # EGFR MUTATED: 44 del- June 28th, 2022- [name].    Cancer of lower lobe of left lung ([place])  03/23/2021 Initial Diagnosis   Cancer of lower lobe of left lung ([place])   03/29/2021 Cancer Staging   Staging form: Lung, AJCC 8th Edition - Clinical: Stage IVB (cT3, cN3, pM1c) - Signed by Nathan Sickle, MD on 03/29/2021    03/30/2021 -  Chemotherapy    Patient is on Treatment Plan: LUNG NSCLC PEMETREXED (ALIMTA) / CARBOPLATIN Q21D X 1 CYCLES         ALLERGIES:  is allergic to codeine.  MEDICATIONS:  Current Outpatient Medications  Medication Sig Dispense Refill   apixaban (ELIQUIS) 5 MG TABS tablet Take 1 tablet (21m) twice daily 60 tablet 2   chlorpheniramine-HYDROcodone (TUSSIONEX) 10-8 MG/5ML SUER Take 5 mLs by mouth every 12 (twelve) hours as needed for cough. 140 mL 0   dexamethasone (DECADRON) 2 MG tablet Take 1 tablet (2 mg total) by mouth 2 (two) times daily. 60 tablet 0   folic acid (FOLVITE) 1 MG tablet Take 1 tablet (1 mg total) by mouth daily. 90 tablet 1   lidocaine-prilocaine (EMLA) cream Apply 1 application topically as needed. 30 g 0   LORazepam (ATIVAN) 0.5 MG tablet Take 1 tablet (0.5 mg total) by mouth every 8 (eight) hours. (Patient not taking: Reported on 04/20/2021) 30 tablet 0   metoprolol succinate (TOPROL XL) 25 MG 24 hr tablet Take 1 tablet (25 mg total) by mouth daily. 30 tablet 1   ondansetron (ZOFRAN) 8 MG tablet One pill every 8 hours as needed for nausea/vomitting.  40 tablet 1   osimertinib mesylate (TAGRISSO) 80 MG tablet Take 1 tablet (80 mg total) by mouth daily. 30 tablet 4   prochlorperazine (COMPAZINE) 10 MG tablet Take 1 tablet (10 mg total) by mouth every 6 (six) hours as needed for nausea or vomiting. 40 tablet 1   sertraline  (ZOLOFT) 50 MG tablet Take 0.5 tablets (25 mg total) by mouth daily. 30 tablet 0   No current facility-administered medications for this visit.    VITAL SIGNS: There were no vitals taken for this visit. There were no vitals filed for this visit.  Estimated body mass index is 28.7 kg/m as calculated from the following:   Height as of an earlier encounter on 04/20/21: _0  (1.778 m).   Weight as of an earlier encounter on 04/20/21: 90.7 kg (200 lb).  LABS: CBC:    Component Value Date/Time   WBC 15.0 (H) 04/20/2021 0828   HGB 14.2 04/20/2021 0828   HCT 42.8 04/20/2021 0828   PLT 205 04/20/2021 0828   MCV 92.6 04/20/2021 0828   NEUTROABS 7.7 04/20/2021 0828   LYMPHSABS 4.5 (H) 04/20/2021 0828   MONOABS 1.5 (H) 04/20/2021 0828   EOSABS 0.2 04/20/2021 0828   BASOSABS 0.0 04/20/2021 0828   Comprehensive Metabolic Panel:    Component Value Date/Time   NA 136 04/20/2021 0828   K 3.9 04/20/2021 0828   CL 105 04/20/2021 0828   CO2 24 04/20/2021 0828   BUN 16 04/20/2021 0828   CREATININE 0.79 04/20/2021 0828   GLUCOSE 93 04/20/2021 0828   CALCIUM 8.4 (L) 04/20/2021 0828   AST 27 04/20/2021 0828   ALT 89 (H) 04/20/2021 0828   ALKPHOS 292 (H) 04/20/2021 0828   BILITOT 0.5 04/20/2021 0828   PROT 6.0 (L) 04/20/2021 0828   ALBUMIN 2.9 (L) 04/20/2021 0828     Present during today's visit: Patient and his wife [name]  Assessment and Plan: Overall patient is doing well and slightly improved since starting his osimertinib. In clinic today he did bring his oxygen tank but was not requiring it during the appt. Continue osimertinib 2m daily.   Of note, patient was recently diagnosed with a DVT and started on apixaban.   Oral Chemotherapy Side Effect/Intolerance:  Skin: patient has noticed an increase in dry skin on his hands, but no rash or itchy skin. It is hard for him to lotion after his shower because of how tired the process of showing makes him. He often goes to sleep  following his shower.  Fatigue: patient is still fatigued, but has noticed an improvement in his energy [name].  No reported diarrhea or nail changes  Oral Chemotherapy Adherence: no reported missed doses No patient barriers to medication adherence identified.   New medications: apixaban, no relevant drug-drug interaction with osimertinib  Medication Access Issues: Patient had a voicemail from [employer] he will give them a call back to be set-up for medication delivery  Patient expressed understanding and was in agreement with this plan. He also understands that He can call clinic at any time with any questions, concerns, or complaints.   Thank you for allowing me to participate in the care of this very pleasant patient.   Time Total: 15 mins  Visit consisted of counseling and education on dealing with issues of symptom management in the setting of serious and potentially life-threatening illness.Greater than 50%  of this time was spent counseling and coordinating care related to the above assessment and plan.  Signed  by: Darl Pikes, PharmD, BCPS, Salley Slaughter, CPP Hematology/Oncology Clinical Pharmacist Practitioner [facility] [phone]  04/20/2021 11:07 AM

## 2021-04-20 NOTE — Assessment & Plan Note (Addendum)
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 Peritoneal/omental carcinomatosis; Diffuse bone metastases.  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  EGFR- 19 del POSITIVE.  # Patient status post Norma Fredrickson Alimta 3 weeks ago pending EGFR results.  Patient has been on osimertinib 80 mg a day for the last 2 weeks.  Patient tolerating well.  No clinical signs of worsening.  #Multiple brain mets subcentimeter asymptomatic-  Dexamethasone 2 mg twice daily. Monitor closely- will plan we will plan repeat brain MRI the next 4 to 6 weeks.  # ?Symptomatic DVT of the right calf [presented with symptoms of right foot pain/incidental]-recommend continued anticoagulation for milligrams twice daily given the active malignancy.  # Right foot pain-unclear etiology suspect musculoskeletal rather than malignancy related.  Recommend Voltaren gel.;  Calluses bilateral feet-recommend 40% urea cream.  #Bone metastases-recommend Zometa; discussed regarding hypercalcemia/calcium supplementation.  Discussed regarding risk of osteonecrosis of the jaw-however benefits outweigh the risk at this time.  # Cough/chest pain/anxiety--secondary to malignancy; continue Tussionex. prn  #Cardiac arrhythmia-SVT s/p adenosine post thoracentesis [June 2022]; Hx of WPW-STABLE>   # DISPOSITION: # follow up in 2 week-MD; labs- cbc/cmp; Zometa-Dr.B

## 2021-04-23 ENCOUNTER — Encounter: Payer: Self-pay | Admitting: Nurse Practitioner

## 2021-04-23 ENCOUNTER — Encounter: Payer: Self-pay | Admitting: Internal Medicine

## 2021-04-26 ENCOUNTER — Other Ambulatory Visit: Payer: Self-pay | Admitting: Internal Medicine

## 2021-04-26 DIAGNOSIS — C7951 Secondary malignant neoplasm of bone: Secondary | ICD-10-CM | POA: Insufficient documentation

## 2021-04-26 LAB — FUNGUS CULTURE WITH STAIN

## 2021-04-26 LAB — FUNGUS CULTURE RESULT

## 2021-04-26 LAB — FUNGAL ORGANISM REFLEX

## 2021-05-01 LAB — ACID FAST CULTURE WITH REFLEXED SENSITIVITIES (MYCOBACTERIA): Acid Fast Culture: NEGATIVE

## 2021-05-02 ENCOUNTER — Other Ambulatory Visit: Payer: Self-pay | Admitting: Specialist

## 2021-05-02 DIAGNOSIS — J90 Pleural effusion, not elsewhere classified: Secondary | ICD-10-CM

## 2021-05-03 ENCOUNTER — Other Ambulatory Visit: Payer: Self-pay | Admitting: Interventional Radiology

## 2021-05-03 ENCOUNTER — Other Ambulatory Visit: Payer: Self-pay

## 2021-05-03 ENCOUNTER — Ambulatory Visit
Admission: RE | Admit: 2021-05-03 | Discharge: 2021-05-03 | Disposition: A | Discharge status: Home / Self Care | Payer: Managed Care, Other (non HMO) | Source: Ambulatory Visit | Attending: Interventional Radiology | Admitting: Interventional Radiology

## 2021-05-03 ENCOUNTER — Ambulatory Visit
Admission: RE | Admit: 2021-05-03 | Discharge: 2021-05-03 | Disposition: A | Discharge status: Home / Self Care | Payer: Managed Care, Other (non HMO) | Source: Ambulatory Visit | Attending: Specialist | Admitting: Specialist

## 2021-05-03 DIAGNOSIS — Z9889 Other specified postprocedural states: Secondary | ICD-10-CM | POA: Insufficient documentation

## 2021-05-03 DIAGNOSIS — J90 Pleural effusion, not elsewhere classified: Secondary | ICD-10-CM

## 2021-05-03 IMAGING — DX DG CHEST 1V PORT
1 series · 1 of 1 positions shown · non-contrast
Comparison: [DATE]

CLINICAL DATA: Left lung cancer, recurrent left effusion, status
post thoracentesis, 700 cc removed

EXAM:
PORTABLE CHEST 1 VIEW

[chest ap]
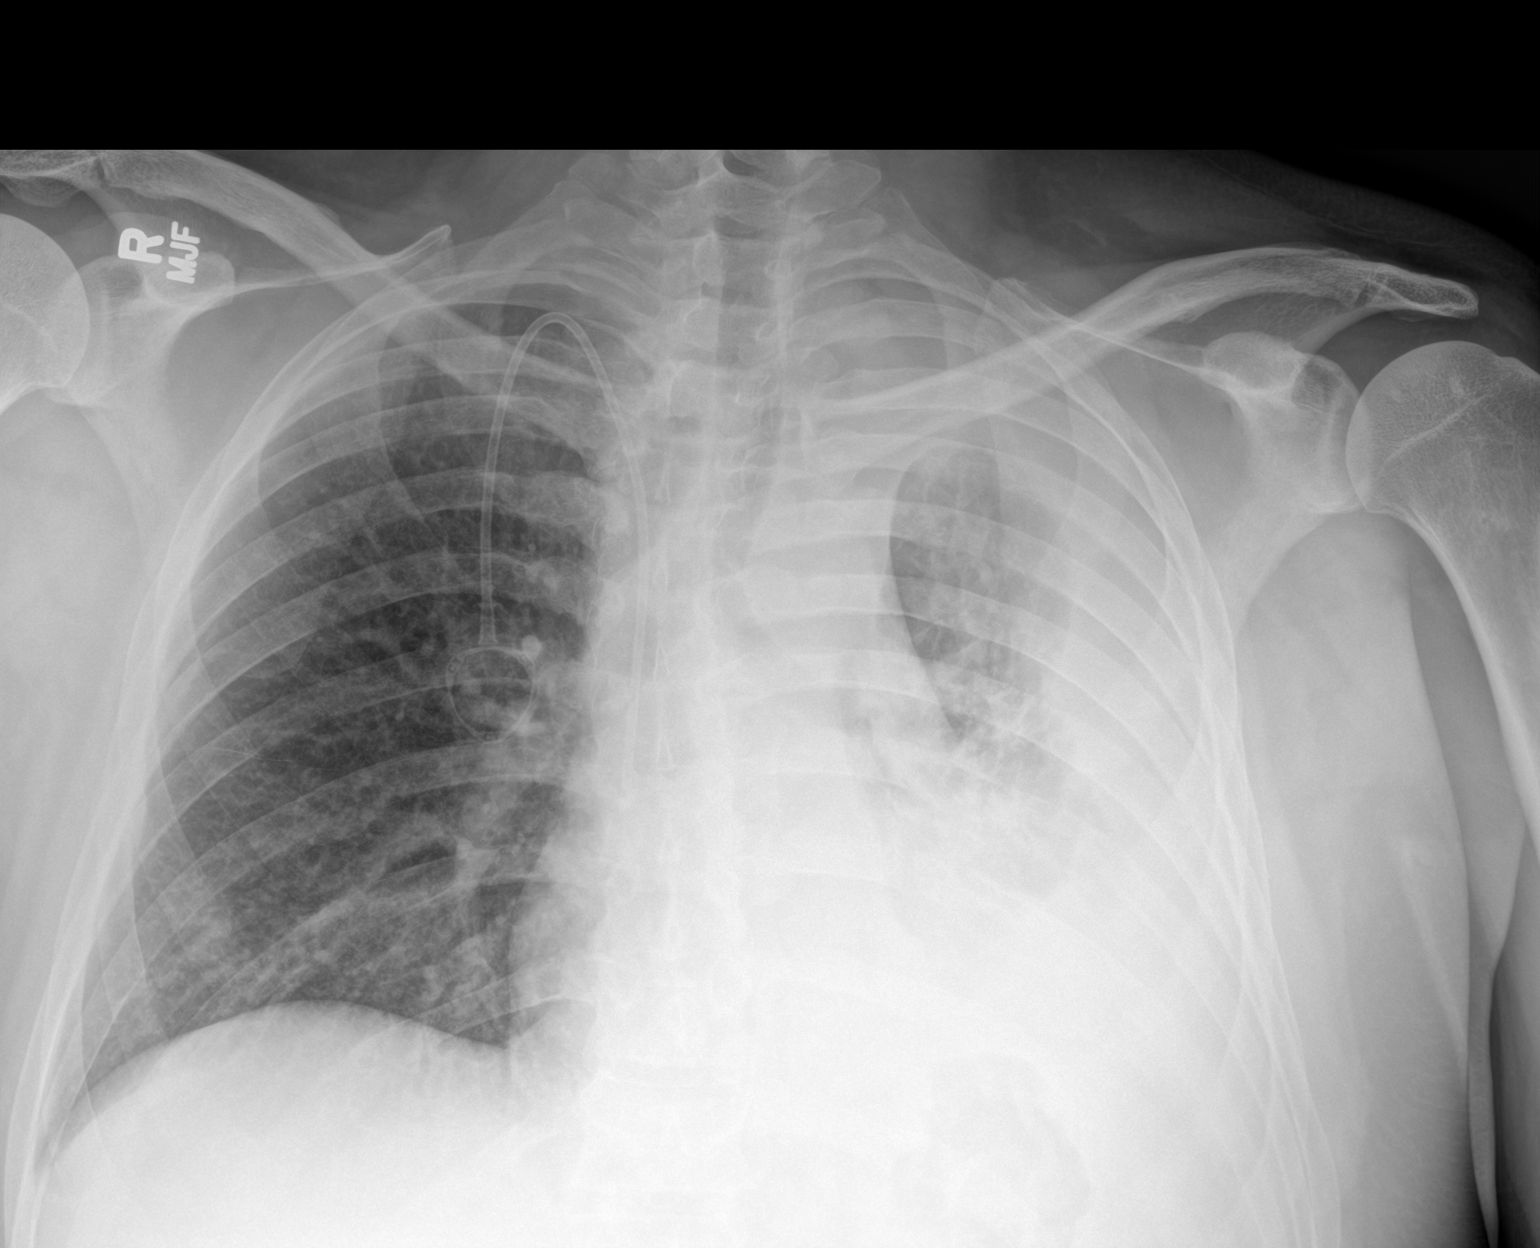

[1 of 1 positions shown; findings below may reference images not displayed]

FINDINGS: Moderately large left effusion remains with left lung
collapse/consolidation. No pneumothorax. Right port catheter tip
lower SVC level. Stable right lung aeration. Trachea midline.
IMPRESSION: No complicating feature following left thoracentesis with residual
left effusion and left lung collapse/consolidation noted.

## 2021-05-03 NOTE — Procedures (Signed)
Interventional Radiology Procedure Note  Procedure: [place] LEFT THORA    Complications: None  Estimated Blood Loss:  MIN  Findings: 700CC DARK AMBER FLD, LABS SENT    Tamera Punt, MD

## 2021-05-05 ENCOUNTER — Encounter: Payer: Self-pay | Admitting: Internal Medicine

## 2021-05-05 ENCOUNTER — Inpatient Hospital Stay: Payer: Managed Care, Other (non HMO)

## 2021-05-05 ENCOUNTER — Telehealth: Payer: Self-pay | Admitting: Internal Medicine

## 2021-05-05 ENCOUNTER — Inpatient Hospital Stay (HOSPITAL_BASED_OUTPATIENT_CLINIC_OR_DEPARTMENT_OTHER): Payer: Managed Care, Other (non HMO) | Admitting: Internal Medicine

## 2021-05-05 VITALS — BP 118/83 | HR 61 | Temp 97.5°F | Resp 16 | Ht 70.0 in | Wt 203.6 lb

## 2021-05-05 DIAGNOSIS — C3492 Malignant neoplasm of unspecified part of left bronchus or lung: Secondary | ICD-10-CM

## 2021-05-05 DIAGNOSIS — C3432 Malignant neoplasm of lower lobe, left bronchus or lung: Secondary | ICD-10-CM

## 2021-05-05 LAB — CBC WITH DIFFERENTIAL/PLATELET
Abs Immature Granulocytes: 1.02 10*3/uL — ABNORMAL HIGH (ref 0.00–0.07)
Basophils Absolute: 0.2 10*3/uL — ABNORMAL HIGH (ref 0.0–0.1)
Basophils Relative: 1 %
Eosinophils Absolute: 0.2 10*3/uL (ref 0.0–0.5)
Eosinophils Relative: 1 %
HCT: 42.8 % (ref 39.0–52.0)
Hemoglobin: 14.1 g/dL (ref 13.0–17.0)
Immature Granulocytes: 6 %
Lymphocytes Relative: 18 %
Lymphs Abs: 3.1 10*3/uL (ref 0.7–4.0)
MCH: 30.9 pg (ref 26.0–34.0)
MCHC: 32.9 g/dL (ref 30.0–36.0)
MCV: 93.7 fL (ref 80.0–100.0)
Monocytes Absolute: 1.4 10*3/uL — ABNORMAL HIGH (ref 0.1–1.0)
Monocytes Relative: 8 %
Neutro Abs: 11.4 10*3/uL — ABNORMAL HIGH (ref 1.7–7.7)
Neutrophils Relative %: 66 %
Platelets: 190 10*3/uL (ref 150–400)
RBC: 4.57 MIL/uL (ref 4.22–5.81)
RDW: 15.4 % (ref 11.5–15.5)
Smear Review: NORMAL
WBC: 17.3 10*3/uL — ABNORMAL HIGH (ref 4.0–10.5)
nRBC: 0 % (ref 0.0–0.2)

## 2021-05-05 LAB — COMPREHENSIVE METABOLIC PANEL
ALT: 47 U/L — ABNORMAL HIGH (ref 0–44)
AST: 21 U/L (ref 15–41)
Albumin: 3.1 g/dL — ABNORMAL LOW (ref 3.5–5.0)
Alkaline Phosphatase: 139 U/L — ABNORMAL HIGH (ref 38–126)
Anion gap: 8 (ref 5–15)
BUN: 19 mg/dL (ref 6–20)
CO2: 23 mmol/L (ref 22–32)
Calcium: 8.3 mg/dL — ABNORMAL LOW (ref 8.9–10.3)
Chloride: 106 mmol/L (ref 98–111)
Creatinine, Ser: 0.94 mg/dL (ref 0.61–1.24)
GFR, Estimated: >60 mL/min (ref >60)
Glucose, Bld: 96 mg/dL (ref 70–99)
Potassium: 3.9 mmol/L (ref 3.5–5.1)
Sodium: 137 mmol/L (ref 135–145)
Total Bilirubin: 0.4 mg/dL (ref 0.3–1.2)
Total Protein: 6 g/dL — ABNORMAL LOW (ref 6.5–8.1)

## 2021-05-05 LAB — CYTOLOGY - NON PAP

## 2021-05-05 MED ORDER — SODIUM CHLORIDE 0.9% FLUSH
10.0000 mL | Freq: Once | INTRAVENOUS | Status: AC
Start: 2021-05-05 — End: 2021-05-05
  Administered 2021-05-05: 10 mL via INTRAVENOUS
  Filled 2021-05-05: qty 10

## 2021-05-05 MED ORDER — CLINDAMYCIN PHOSPHATE 1 % EX GEL: Freq: Two times a day (BID) | CUTANEOUS | 0 refills | Status: DC

## 2021-05-05 MED ORDER — HEPARIN SOD (PORK) LOCK FLUSH 100 UNIT/ML IV SOLN
500.0000 [IU] | Freq: Once | INTRAVENOUS | Status: AC
Start: 2021-05-05 — End: 2021-05-05
  Administered 2021-05-05: 500 [IU]
  Filled 2021-05-05: qty 5

## 2021-05-05 NOTE — Assessment & Plan Note (Addendum)
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 Peritoneal/omental carcinomatosis; Diffuse bone metastases.  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  EGFR- 19 del POSITIVE.  #Currently on osimertinib [since June 28th] ~3 weeks.  Clinically significantly improved.  Monitor closely-we will plan to get imaging in 2 to 3 months.  #Skin rash acne-G-. like-question osimertinib versus more swelling lotion-  #Multiple brain mets subcentimeter asymptomatic [NO RT]-  Dexamethasone 2 mg twice daily. Monitor closely- will plan we will plan repeat brain MRI the next 2-4 weeks.   #Left-sided pleural effusion recurrent-I would await for osimertinib to work; hold off pleurodesis at this time.  We will recheck chest x-ray in 2 weeks.  # ?Symptomatic DVT of the right calf-on Eliquis pain improved.  Continue monitoring  #Bone metastases-recommend Zometa;-however hold Zometa because of low calcium 8.3.  Recommend increase calcium vitamin D intake.  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  Defer to cardiology regarding further management.  # DISPOSITION: # HOLD zometa; DC needle # follow up in 2 week-MD; labs- cbc/cmp;CXR prior-possible zometa -Dr.B

## 2021-05-05 NOTE — Telephone Encounter (Signed)
On 7/22 I spoke to patient's wife regarding the forehead rash concerning from osimertinib.  Recommend clindamycin gel if not improved in the next 1 to 2 days.  Prescription sent.

## 2021-05-05 NOTE — Progress Notes (Signed)
[place] CONSULT NOTE  Patient Care Team: Pcp, No as PCP - General [name], RN as Oncology Nurse Navigator  CHIEF COMPLAINTS/PURPOSE OF CONSULTATION: Lung cancer  #  Oncology History Overview Note  IMPRESSION: 1. Patchy nodular fat stranding throughout the anterior left upper quadrant peritoneal fat, nonspecific, cannot exclude peritoneal carcinomatosis. Dedicated CT abdomen/pelvis with oral and IV contrast recommended for further evaluation. 2. Dense patchy consolidation replacing much of the left lower lung lobe, appearing masslike in the superior segment left lower lobe, with associated bulging of the left major fissure and associated left lower lobe volume loss. Fine nodularity throughout both lungs with an upper lobe predominance. Asymmetric left upper lobe interlobular septal thickening. These findings are indeterminate, with differential including multilobar pneumonia, sarcoidosis or a neoplastic process. The persistence on radiographs back to 01/20/2021 despite antibiotic therapy make sarcoidosis or a neoplastic process more likely. Pulmonology consultation suggested for consideration of bronchoscopic evaluation. 3. Small dependent left pleural effusion. 4. Mild mediastinal lymphadenopathy, nonspecific. 5. Subacute healing lateral right sixth rib fracture.   DIAGNOSIS:  A. LUNG, LEFT LOWER LOBE; ENB-ASSISTED BIOPSY:  - NON-SMALL CELL CARCINOMA, FAVOR ADENOCARCINOMA.  - FOREIGN MATERIAL SUGGESTIVE OF ASPIRATION.   # EGFR MUTATED: 55 del- June 28th, 2022- [name].    Cancer of lower lobe of left lung ([place])  03/23/2021 Initial Diagnosis   Cancer of lower lobe of left lung ([place])   03/29/2021 Cancer Staging   Staging form: Lung, AJCC 8th Edition - Clinical: Stage IVB (cT3, cN3, pM1c) - Signed by [name], MD on 03/29/2021    03/30/2021 -  Chemotherapy    Patient is on Treatment Plan: LUNG NSCLC PEMETREXED (ALIMTA) /  CARBOPLATIN Q21D X 1 CYCLES          HISTORY OF PRESENTING ILLNESS:  [name] 40 y.o.  male patient with stage IV lung cancer adenocarcinoma  EGFR mutated on osimertinib is here for follow-up.    In the interim patient was evaluated by pulmonary Dr. Imagene Sheller x-ray showed pleural effusion the left side.  Patient underwent thoracentesis 750 cc; stopped because of pain.  However the leg pain is much better.  He notes to have non-itchy rash on the face forehead- ?  New moisturizing lotion versus others.  No diarrhea. Steroids dexamethasone 2 mg twice daily.  Review of Systems  Constitutional:  Positive for malaise/fatigue and weight loss. Negative for chills, diaphoresis and fever.  HENT:  Negative for nosebleeds and sore throat.   Eyes:  Negative for double vision.  Respiratory:  Positive for shortness of breath. Negative for wheezing.   Cardiovascular:  Negative for chest pain, palpitations, orthopnea and leg swelling.  Gastrointestinal:  Negative for abdominal pain, blood in stool, diarrhea, heartburn, melena, nausea and vomiting.  Genitourinary:  Negative for dysuria, frequency and urgency.  Musculoskeletal:  Negative for back pain and joint pain.  Skin: Negative.  Negative for itching and rash.  Neurological:  Negative for dizziness, tingling, focal weakness, weakness and headaches.  Endo/Heme/Allergies:  Does not bruise/bleed easily.  Psychiatric/Behavioral:  Negative for depression. The patient is not nervous/anxious and does not have insomnia.     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    SURGICAL HISTORY: Past Surgical History:  Procedure Laterality Date   FRACTURE SURGERY  broke femur when he was 6 years   PORTA CATH INSERTION N/A 03/28/2021   Procedure: PORTA CATH INSERTION;  Surgeon: Katha Cabal, MD;  Location: [facility];  Service: Cardiovascular;  Laterality: N/A;   VIDEO BRONCHOSCOPY WITH ENDOBRONCHIAL NAVIGATION N/A 03/15/2021   Procedure: VIDEO BRONCHOSCOPY WITH ENDOBRONCHIAL NAVIGATION;  Surgeon: Ottie Glazier, MD;  Location: [facility];  Service: Thoracic;  Laterality: N/A;   VIDEO BRONCHOSCOPY WITH ENDOBRONCHIAL ULTRASOUND N/A 03/15/2021   Procedure: VIDEO BRONCHOSCOPY WITH ENDOBRONCHIAL ULTRASOUND;  Surgeon: Ottie Glazier, MD;  Location: [facility];  Service: Thoracic;  Laterality: N/A;    SOCIAL HISTORY: Social History   Socioeconomic History   Marital status: Married    Spouse name: [name]   Number of children: 2   Years of education: Not on file   Highest education level: Not on file  Occupational History   Not on file  Tobacco Use   Smoking status: Never   Smokeless tobacco: Current    Types: Snuff, Chew  Vaping Use   Vaping Use: Never used  Substance and Sexual Activity   Alcohol use: Never   Drug use: Never   Sexual activity: Not on file  Other Topics Concern   Not on file  Social History Narrative   [place]; with wife; 2 daughters[12 and 22]; never smoked; rare alcohol. Work in [employer]    Social Determinants of Health   Financial Resource Strain: Not on file  Food Insecurity: Not on file  Transportation Needs: Not on file  Physical Activity: Not on file  Stress: Not on file  Social Connections: Not on file  Intimate Partner Violence: Not on file    FAMILY HISTORY: Family History  Problem Relation Age of Onset   High Cholesterol Mother    Hypertension Father    Cancer Father    Lung cancer Father    Skin cancer Father     ALLERGIES:  is allergic to codeine.  MEDICATIONS:  Current Outpatient Medications  Medication Sig Dispense Refill   apixaban (ELIQUIS) 5 MG TABS tablet Take 1 tablet (89m) twice daily 60 tablet 2   chlorpheniramine-HYDROcodone (TUSSIONEX) 10-8 MG/5ML SUER Take 5 mLs by mouth every 12 (twelve) hours as needed for  cough. 140 mL 0   dexamethasone (DECADRON) 2 MG tablet Take 1 tablet (2 mg total) by mouth 2 (two) times daily. 60 tablet 0   folic acid (FOLVITE) 1 MG tablet Take 1 tablet (1 mg total) by mouth daily. 90 tablet 1   lidocaine-prilocaine (EMLA) cream Apply 1 application topically as needed. 30 g 0   metoprolol succinate (TOPROL XL) 25 MG 24 hr tablet Take 1 tablet (25 mg total) by mouth daily. 30 tablet 1   ondansetron (ZOFRAN) 8 MG tablet One pill every 8 hours as needed for nausea/vomitting. 40 tablet 1   osimertinib mesylate (TAGRISSO) 80 MG tablet Take 1 tablet (80 mg total) by mouth daily. 30 tablet 4   prochlorperazine (COMPAZINE) 10 MG tablet Take 1 tablet (10 mg total) by mouth every 6 (six) hours as needed for nausea or vomiting. 40 tablet 1   sertraline (ZOLOFT) 50 MG tablet Take 0.5 tablets (25 mg total) by mouth daily. 30 tablet 0   LORazepam (ATIVAN) 0.5 MG tablet Take 1 tablet (0.5 mg total) by mouth every 8 (eight) hours. (Patient not taking: No sig reported) 30 tablet 0   No current facility-administered medications for this visit.      .Marland Kitchen  PHYSICAL EXAMINATION: ECOG PERFORMANCE STATUS: 1 - Symptomatic but completely ambulatory  Vitals:   05/05/21 0835  BP: 118/83  Pulse: 61  Resp: 16  Temp: (!) 97.5 F (36.4 C)  SpO2: 100%   Filed Weights   05/05/21 0835  Weight: 203 lb 9.6 oz (92.4 kg)    Physical Exam Constitutional:      Comments: Ambulating independently.  With his wife.  HENT:     Head: Normocephalic and atraumatic.     Mouth/Throat:     Pharynx: No oropharyngeal exudate.  Eyes:     Pupils: Pupils are equal, round, and reactive to light.  Cardiovascular:     Rate and Rhythm: Normal rate and regular rhythm.  Pulmonary:     Effort: No respiratory distress.     Breath sounds: No wheezing.     Comments: Decreased breath sound on the left side compared to right. Abdominal:     General: Bowel sounds are normal. There is no distension.     Palpations:  Abdomen is soft. There is no mass.     Tenderness: There is no abdominal tenderness. There is no guarding or rebound.  Musculoskeletal:        General: No tenderness. Normal range of motion.     Cervical back: Normal range of motion and neck supple.  Skin:    General: Skin is warm.     Comments: Acne-like rash noted in the forehead.  Neurological:     Mental Status: He is alert and oriented to person, place, and time.  Psychiatric:        Mood and Affect: Affect normal.     LABORATORY DATA:  I have reviewed the data as listed Lab Results  Component Value Date   WBC 17.3 (H) 05/05/2021   HGB 14.1 05/05/2021   HCT 42.8 05/05/2021   MCV 93.7 05/05/2021   PLT 190 05/05/2021   Recent Labs    04/05/21 0808 04/20/21 0828 05/05/21 0818  NA 132* 136 137  K 3.7 3.9 3.9  CL 99 105 106  CO2 _0 GLUCOSE 108* 93 96  BUN _1 CREATININE 0.63 0.79 0.94  CALCIUM 8.2* 8.4* 8.3*  GFRNONAA >60 >60 >60  PROT 6.1* 6.0* 6.0*  ALBUMIN 2.7* 2.9* 3.1*  AST 41 27 21  ALT 107* 89* 47*  ALKPHOS 335* 292* 139*  BILITOT 0.8 0.5 0.4    RADIOGRAPHIC STUDIES: I have personally reviewed the radiological images as listed and agreed with the findings in the report. US Venous Img Lower Unilateral Right  Result Date: 04/18/2021 CLINICAL DATA:  Right foot pain for the past 2 days. History of lung cancer. Evaluate for DVT. EXAM: RIGHT LOWER EXTREMITY VENOUS DOPPLER ULTRASOUND TECHNIQUE: Gray-scale sonography with graded compression, as well as color Doppler and duplex ultrasound were performed to evaluate the lower extremity deep venous systems from the level of the common femoral vein and including the common femoral, femoral, profunda femoral, popliteal and calf veins including the posterior tibial, peroneal and gastrocnemius veins when visible. The superficial great saphenous vein was also interrogated. Spectral Doppler was utilized to evaluate flow at rest and with distal augmentation  maneuvers in the common femoral, femoral and popliteal veins. COMPARISON:  None. FINDINGS: Contralateral Common Femoral Vein: Respiratory phasicity is normal and symmetric with the symptomatic side. No evidence of thrombus. Normal compressibility. Common Femoral Vein: No evidence of thrombus. Normal compressibility, respiratory phasicity and response to augmentation. Saphenofemoral Junction: No evidence of thrombus.  Normal compressibility and flow on color Doppler imaging. Profunda Femoral Vein: No evidence of thrombus. Normal compressibility and flow on color Doppler imaging. Femoral Vein: No evidence of thrombus. Normal compressibility, respiratory phasicity and response to augmentation. Popliteal Vein: No evidence of thrombus. Normal compressibility, respiratory phasicity and response to augmentation. Calf Veins: There is hypoechoic occlusive thrombus involving one of the paired right posterior tibial veins (images 32 and 33). The paired right peroneal veins appear patent where imaged. Superficial Great Saphenous Vein: No evidence of thrombus. Normal compressibility. Other Findings:  None. IMPRESSION: The examination is positive for occlusive DVT involving one of the paired right posterior tibial veins. There is no extension of this distal occlusive tibial DVT to the more proximal venous system of the right lower extremity. Electronically Signed   By: Sandi Mariscal M.D.   On: 04/18/2021 14:18   DG Chest Port 1 View  Result Date: 05/03/2021 CLINICAL DATA:  Left lung cancer, recurrent left effusion, status post thoracentesis, 700 cc removed EXAM: PORTABLE CHEST 1 VIEW COMPARISON:  04/04/2021 FINDINGS: Moderately large left effusion remains with left lung collapse/consolidation. No pneumothorax. Right port catheter tip lower SVC level. Stable right lung aeration. Trachea midline. IMPRESSION: No complicating feature following left thoracentesis with residual left effusion and left lung collapse/consolidation noted.  Electronically Signed   By: Jerilynn Mages.  Shick M.D.   On: 05/03/2021 15:14   US THORACENTESIS ASP PLEURAL SPACE W/IMG GUIDE  Result Date: 05/03/2021 INDICATION: Recurrent malignant left effusion EXAM: ULTRASOUND GUIDED LEFT THORACENTESIS MEDICATIONS: 1% lidocaine local COMPLICATIONS: None immediate. PROCEDURE: An ultrasound guided thoracentesis was thoroughly discussed with the patient and questions answered. The benefits, risks, alternatives and complications were also discussed. The patient understands and wishes to proceed with the procedure. Written consent was obtained. Ultrasound was performed to localize and mark an adequate pocket of fluid in the left chest. The area was then prepped and draped in the normal sterile fashion. 1% Lidocaine was used for local anesthesia. Under ultrasound guidance a 6 Fr Safe-T-Centesis catheter was introduced. Thoracentesis was performed. The catheter was removed and a dressing applied. FINDINGS: A total of approximately 700 cc of dark amber pleural fluid was removed. Samples were sent to the laboratory as requested by the clinical team. IMPRESSION: Successful ultrasound guided left thoracentesis yielding 700 cc of pleural fluid. Electronically Signed   By: Jerilynn Mages.  Shick M.D.   On: 05/03/2021 15:13    ASSESSMENT & PLAN:   Cancer of lower lobe of left lung ([place]) # STAGE IV- Left upper lobe lung cancer-adenocarcinoma the lung- PET June 2022- Left lung collapse with 7 cm hypermetabolic masslike opacity centered in the left lower lobe and involving the left hilum; Large malignant left pleural effusion;  Hypermetabolic lymphadenopathy in mediastinum and left supraclavicular region. Peritoneal/omental carcinomatosis; Diffuse bone metastases.  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  EGFR- 19 del POSITIVE.  #Currently on osimertinib [since June 28th] ~3 weeks.  Clinically significantly improved.  Monitor closely-we will plan to get imaging in 2 to 3  months.  #Multiple brain mets subcentimeter asymptomatic [NO RT]-  Dexamethasone 2 mg twice daily. Monitor closely- will plan we will plan repeat brain MRI the next 2-4 weeks.   #Left-sided pleural effusion recurrent-I would await for osimertinib to work; hold off pleurodesis at this time.  We will recheck chest x-ray in 2 weeks.  # ?Symptomatic DVT of the right calf-on Eliquis pain improved.  Continue monitoring  #Bone metastases-recommend Zometa;-however hold Zometa because of low calcium  8.3.  Recommend increase calcium vitamin D intake.  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  Defer to cardiology regarding further management.  # DISPOSITION: # HOLD zometa; DC needle # follow up in 2 week-MD; labs- cbc/cmp;CXR prior-possible zometa -Dr.B    All questions were answered. The patient knows to call the clinic with any problems, questions or concerns.    [name], MD 05/05/2021 9:47 AM

## 2021-05-05 NOTE — Progress Notes (Signed)
Nutrition Follow-up:  Patient with stage IV non small cell lung cancer with mets to bone, brain, omental, and peritoneal carcinomatosis.  Recent thoracentesis on 7/20 with 748m removed.  Patient on tagrisso.    Met with patient and wife following MD appointment.  Patient reports that his appetite is good and eating well.  Usually eats pack of nabs with medication in am.  Mid morning has biscuit (bacon, egg, cheese).  Eats again around 2-4pm of chips, sandwich.  6-6:30pm is supper which consists of meat and couple sides. HS snack of ice cream, cereal.     Medications: reviewed  Labs: Calcim 8.3  Anthropometrics:   Weight 203 lb 9.6 oz today  191 lb on 6/22  UBW of 212-220 lb   NUTRITION DIAGNOSIS: Inadequate oral intake improved   INTERVENTION:  Patient and wife with questions regarding foods high in calcium due to slightly low calcium today. Taking calcium/vit D gummie daily.  High calcium food list given to patient.  Encouraged patient to space intake, supplements of calcium out as body can only absorb about 500 mg at one time.     MONITORING, EVALUATION, GOAL: weight trends, intake   NEXT VISIT: Friday, August 5   Nathan Miner B. AZenia Resides RArroyo LYoungstownRegistered Dietitian [phone](mobile)

## 2021-05-08 ENCOUNTER — Telehealth: Payer: Self-pay | Admitting: *Deleted

## 2021-05-08 NOTE — Telephone Encounter (Signed)
Pt would like to get a letter for work stating he can go back to work without restrictions. Please advise.

## 2021-05-09 ENCOUNTER — Encounter: Payer: Self-pay | Admitting: *Deleted

## 2021-05-09 ENCOUNTER — Encounter: Payer: Self-pay | Admitting: Internal Medicine

## 2021-05-09 ENCOUNTER — Encounter: Payer: Self-pay | Admitting: Nurse Practitioner

## 2021-05-09 NOTE — Telephone Encounter (Addendum)
Can pt get a letter with those restrictions listed so he can return to work? Per pt's wife pt would need it to list restrictions for no driving, heavy lifting greater than 10lbs and operating rolling stock if Dr. B agrees. 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

## 2021-05-09 NOTE — Telephone Encounter (Addendum)
Pt's wife has been made aware and would like to know his restrictions if he returns to work. Please advise.

## 2021-05-09 NOTE — Telephone Encounter (Signed)
[name]- I spoke with Dr. Rogue Bussing - Dr. B will not sign letter at this time w/o restrictions- given his brain mets.

## 2021-05-09 NOTE — Telephone Encounter (Signed)
Avoid - driving and heavy lifting greater than 10 lbs and operating machinery.

## 2021-05-10 ENCOUNTER — Other Ambulatory Visit: Payer: Self-pay | Admitting: *Deleted

## 2021-05-10 ENCOUNTER — Other Ambulatory Visit: Payer: Self-pay | Admitting: Internal Medicine

## 2021-05-10 MED ORDER — CLINDAMYCIN PHOSPHATE 1 % EX GEL: Freq: Two times a day (BID) | CUTANEOUS | 0 refills | Status: DC

## 2021-05-10 NOTE — Telephone Encounter (Signed)
Letter emailed to pt's wife per request.

## 2021-05-10 NOTE — Telephone Encounter (Signed)
Refill sent in already in different encounter

## 2021-05-11 LAB — ACID FAST CULTURE WITH REFLEXED SENSITIVITIES (MYCOBACTERIA): Acid Fast Culture: NEGATIVE

## 2021-05-15 ENCOUNTER — Inpatient Hospital Stay: Payer: Managed Care, Other (non HMO) | Attending: Oncology

## 2021-05-15 ENCOUNTER — Inpatient Hospital Stay (HOSPITAL_BASED_OUTPATIENT_CLINIC_OR_DEPARTMENT_OTHER): Payer: Managed Care, Other (non HMO) | Admitting: Oncology

## 2021-05-15 ENCOUNTER — Telehealth: Payer: Self-pay | Admitting: *Deleted

## 2021-05-15 VITALS — BP 126/90 | HR 68 | Temp 99.0°F | Resp 18

## 2021-05-15 DIAGNOSIS — C3432 Malignant neoplasm of lower lobe, left bronchus or lung: Secondary | ICD-10-CM | POA: Diagnosis present

## 2021-05-15 DIAGNOSIS — Z452 Encounter for adjustment and management of vascular access device: Secondary | ICD-10-CM | POA: Diagnosis not present

## 2021-05-15 DIAGNOSIS — C3490 Malignant neoplasm of unspecified part of unspecified bronchus or lung: Secondary | ICD-10-CM | POA: Insufficient documentation

## 2021-05-15 DIAGNOSIS — C7951 Secondary malignant neoplasm of bone: Secondary | ICD-10-CM | POA: Diagnosis not present

## 2021-05-15 DIAGNOSIS — R233 Spontaneous ecchymoses: Secondary | ICD-10-CM | POA: Diagnosis present

## 2021-05-15 DIAGNOSIS — I82441 Acute embolism and thrombosis of right tibial vein: Secondary | ICD-10-CM

## 2021-05-15 LAB — CBC WITH DIFFERENTIAL/PLATELET
Abs Immature Granulocytes: 1.26 10*3/uL — ABNORMAL HIGH (ref 0.00–0.07)
Basophils Absolute: 0.2 10*3/uL — ABNORMAL HIGH (ref 0.0–0.1)
Basophils Relative: 1 %
Eosinophils Absolute: 0 10*3/uL (ref 0.0–0.5)
Eosinophils Relative: 0 %
HCT: 43.4 % (ref 39.0–52.0)
Hemoglobin: 14.5 g/dL (ref 13.0–17.0)
Immature Granulocytes: 8 %
Lymphocytes Relative: 11 %
Lymphs Abs: 1.9 10*3/uL (ref 0.7–4.0)
MCH: 31.8 pg (ref 26.0–34.0)
MCHC: 33.4 g/dL (ref 30.0–36.0)
MCV: 95.2 fL (ref 80.0–100.0)
Monocytes Absolute: 1.2 10*3/uL — ABNORMAL HIGH (ref 0.1–1.0)
Monocytes Relative: 7 %
Neutro Abs: 12.2 10*3/uL — ABNORMAL HIGH (ref 1.7–7.7)
Neutrophils Relative %: 73 %
Platelets: 213 10*3/uL (ref 150–400)
RBC: 4.56 MIL/uL (ref 4.22–5.81)
RDW: 16.2 % — ABNORMAL HIGH (ref 11.5–15.5)
Smear Review: NORMAL
WBC: 16.7 10*3/uL — ABNORMAL HIGH (ref 4.0–10.5)
nRBC: 0 % (ref 0.0–0.2)

## 2021-05-15 LAB — BASIC METABOLIC PANEL
Anion gap: 7 (ref 5–15)
BUN: 20 mg/dL (ref 6–20)
CO2: 25 mmol/L (ref 22–32)
Calcium: 8.5 mg/dL — ABNORMAL LOW (ref 8.9–10.3)
Chloride: 104 mmol/L (ref 98–111)
Creatinine, Ser: 0.92 mg/dL (ref 0.61–1.24)
GFR, Estimated: >60 mL/min (ref >60)
Glucose, Bld: 90 mg/dL (ref 70–99)
Potassium: 3.9 mmol/L (ref 3.5–5.1)
Sodium: 136 mmol/L (ref 135–145)

## 2021-05-15 LAB — PROTIME-INR
INR: 1.1 (ref 0.8–1.2)
Prothrombin Time: 13.9 seconds (ref 11.4–15.2)

## 2021-05-15 LAB — APTT: aPTT: 24 seconds (ref 24–36)

## 2021-05-15 NOTE — Telephone Encounter (Signed)
-----   Message from Lloyd Huger, MD sent at 05/14/2021  3:16 PM EDT ----- Patient called this weekend with concern of "petechiae" on his right lower extremity.  Otherwise asymptomatic.  I see he has an appointment with you later in the week, not sure if you wanted symptom management to evaluate him prior.  Thanks. -Tim

## 2021-05-15 NOTE — Progress Notes (Signed)
Symptom Management Consult note [facility]  [phone] [phone]  Patient Care Team: Pcp, No as PCP - General [name], RN as Oncology Nurse Navigator   Name of the patient: [name]  [id number]  July 08, 1981   Date of visit: 05/15/2021   Diagnosis-stage IV lung cancer  Chief complaint/ Reason for visit-lower extremity petechiae  Heme/Onc history: [name] is a 40 year old male with past medical history significant for SVT, B12 deficiency, elevated liver enzymes with recent diagnosis of stage IV lung cancer.  He is followed by Dr. 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  He started this medication on 04/11/2021.  It appears he has been tolerating this medication well except for a rash on his forehead.  Interval history-patient presents today to symptom management for concerns of lower extremity petechiae that he developed over the past few days. He is on eliquis for right lower extremity dvt. He has been on Eliquis for about 1 month. Report significant improvement of RLE swelling and pain. He denies any additional symptoms. No swelling, bleeding or sob.   ECOG FS:1 - Symptomatic but completely ambulatory  Review of systems- Review of Systems  Skin:  Positive for rash (petechiae).    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     Past Surgical History:  Procedure Laterality Date   FRACTURE SURGERY     broke femur when he was 6 years   PORTA CATH INSERTION N/A 03/28/2021   Procedure: PORTA CATH INSERTION;  Surgeon: Katha Cabal, MD;  Location: [place];  Service: Cardiovascular;  Laterality: N/A;   VIDEO  BRONCHOSCOPY WITH ENDOBRONCHIAL NAVIGATION N/A 03/15/2021   Procedure: VIDEO BRONCHOSCOPY WITH ENDOBRONCHIAL NAVIGATION;  Surgeon: Ottie Glazier, MD;  Location: [facility];  Service: Thoracic;  Laterality: N/A;   VIDEO BRONCHOSCOPY WITH ENDOBRONCHIAL ULTRASOUND N/A 03/15/2021   Procedure: VIDEO BRONCHOSCOPY WITH ENDOBRONCHIAL ULTRASOUND;  Surgeon: Ottie Glazier, MD;  Location: [facility];  Service: Thoracic;  Laterality: N/A;    Social History   Socioeconomic History   Marital status: Married    Spouse name: [name]   Number of children: 2   Years of education: Not on file   Highest education level: Not on file  Occupational History   Not on file  Tobacco Use   Smoking status: Never   Smokeless tobacco: Current    Types: Snuff, Chew  Vaping Use   Vaping Use: Never used  Substance and Sexual Activity   Alcohol use: Never   Drug use: Never   Sexual activity: Not on file  Other Topics Concern   Not on file  Social History Narrative   [place]; with wife; 2 daughters[12 and 22]; never smoked; rare alcohol. Work in [employer]    Social Determinants of Health   Financial Resource Strain: Not on file  Food Insecurity: Not on file  Transportation Needs: Not on file  Physical Activity: Not on file  Stress: Not on file  Social Connections: Not on file  Intimate Partner Violence: Not on file    Family History  Problem  Relation Age of Onset   High Cholesterol Mother    Hypertension Father    Cancer Father    Lung cancer Father    Skin cancer Father      Current Outpatient Medications:    apixaban (ELIQUIS) 5 MG TABS tablet, Take 1 tablet (90m) twice daily, Disp: 60 tablet, Rfl: 2   clindamycin (CLINDAGEL) 1 % gel, Apply topically 2 (two) times daily. Apply to affected areas twice a day., Disp: 30 g, Rfl: 0   dexamethasone (DECADRON) 2 MG tablet, Take 1 tablet (2 mg total) by mouth 2 (two) times daily., Disp: 60 tablet, Rfl: 0   folic acid (FOLVITE) 1 MG tablet, Take 1  tablet (1 mg total) by mouth daily., Disp: 90 tablet, Rfl: 1   lidocaine-prilocaine (EMLA) cream, Apply 1 application topically as needed., Disp: 30 g, Rfl: 0   metoprolol succinate (TOPROL XL) 25 MG 24 hr tablet, Take 1 tablet (25 mg total) by mouth daily., Disp: 30 tablet, Rfl: 1   osimertinib mesylate (TAGRISSO) 80 MG tablet, Take 1 tablet (80 mg total) by mouth daily., Disp: 30 tablet, Rfl: 4   sertraline (ZOLOFT) 50 MG tablet, Take 0.5 tablets (25 mg total) by mouth daily., Disp: 30 tablet, Rfl: 0   chlorpheniramine-HYDROcodone (TUSSIONEX) 10-8 MG/5ML SUER, Take 5 mLs by mouth every 12 (twelve) hours as needed for cough. (Patient not taking: Reported on 05/15/2021), Disp: 140 mL, Rfl: 0   LORazepam (ATIVAN) 0.5 MG tablet, Take 1 tablet (0.5 mg total) by mouth every 8 (eight) hours. (Patient not taking: No sig reported), Disp: 30 tablet, Rfl: 0   ondansetron (ZOFRAN) 8 MG tablet, One pill every 8 hours as needed for nausea/vomitting. (Patient not taking: Reported on 05/15/2021), Disp: 40 tablet, Rfl: 1   prochlorperazine (COMPAZINE) 10 MG tablet, Take 1 tablet (10 mg total) by mouth every 6 (six) hours as needed for nausea or vomiting. (Patient not taking: Reported on 05/15/2021), Disp: 40 tablet, Rfl: 1  Physical exam:  Vitals:   05/15/21 1319  BP: 126/90  Pulse: 68  Resp: 18  Temp: 99 F (37.2 C)  TempSrc: Tympanic  SpO2: 99%   Physical Exam Constitutional:      Appearance: Normal appearance.  HENT:     Head: Normocephalic and atraumatic.  Eyes:     Pupils: Pupils are equal, round, and reactive to light.  Cardiovascular:     Rate and Rhythm: Normal rate and regular rhythm.     Heart sounds: Normal heart sounds. No murmur heard. Pulmonary:     Effort: Pulmonary effort is normal.     Breath sounds: Normal breath sounds. No wheezing.  Abdominal:     General: Bowel sounds are normal. There is no distension.     Palpations: Abdomen is soft.     Tenderness: There is no abdominal  tenderness.  Musculoskeletal:        General: Normal range of motion.     Cervical back: Normal range of motion.  Skin:    General: Skin is warm and dry.     Findings: Petechiae (lower extremities only) present. No rash.  Neurological:     Mental Status: He is alert and oriented to person, place, and time.  Psychiatric:        Judgment: Judgment normal.     CMP Latest Ref Rng & Units 05/15/2021  Glucose 70 - 99 mg/dL 90  BUN 6 - 20 mg/dL 20  Creatinine 0.61 - 1.24 mg/dL 0.92  Sodium 135 -  145 mmol/L 136  Potassium 3.5 - 5.1 mmol/L 3.9  Chloride 98 - 111 mmol/L 104  CO2 22 - 32 mmol/L 25  Calcium 8.9 - 10.3 mg/dL 8.5(L)  Total Protein 6.5 - 8.1 g/dL -  Total Bilirubin 0.3 - 1.2 mg/dL -  Alkaline Phos 38 - 126 U/L -  AST 15 - 41 U/L -  ALT 0 - 44 U/L -   CBC Latest Ref Rng & Units 05/15/2021  WBC 4.0 - 10.5 K/uL 16.7(H)  Hemoglobin 13.0 - 17.0 g/dL 14.5  Hematocrit 39.0 - 52.0 % 43.4  Platelets 150 - 400 K/uL 213    No images are attached to the encounter.  US Venous Img Lower Unilateral Right  Result Date: 04/18/2021 CLINICAL DATA:  Right foot pain for the past 2 days. History of lung cancer. Evaluate for DVT. EXAM: RIGHT LOWER EXTREMITY VENOUS DOPPLER ULTRASOUND TECHNIQUE: Gray-scale sonography with graded compression, as well as color Doppler and duplex ultrasound were performed to evaluate the lower extremity deep venous systems from the level of the common femoral vein and including the common femoral, femoral, profunda femoral, popliteal and calf veins including the posterior tibial, peroneal and gastrocnemius veins when visible. The superficial great saphenous vein was also interrogated. Spectral Doppler was utilized to evaluate flow at rest and with distal augmentation maneuvers in the common femoral, femoral and popliteal veins. COMPARISON:  None. FINDINGS: Contralateral Common Femoral Vein: Respiratory phasicity is normal and symmetric with the symptomatic side. No evidence  of thrombus. Normal compressibility. Common Femoral Vein: No evidence of thrombus. Normal compressibility, respiratory phasicity and response to augmentation. Saphenofemoral Junction: No evidence of thrombus. Normal compressibility and flow on color Doppler imaging. Profunda Femoral Vein: No evidence of thrombus. Normal compressibility and flow on color Doppler imaging. Femoral Vein: No evidence of thrombus. Normal compressibility, respiratory phasicity and response to augmentation. Popliteal Vein: No evidence of thrombus. Normal compressibility, respiratory phasicity and response to augmentation. Calf Veins: There is hypoechoic occlusive thrombus involving one of the paired right posterior tibial veins (images 32 and 33). The paired right peroneal veins appear patent where imaged. Superficial Great Saphenous Vein: No evidence of thrombus. Normal compressibility. Other Findings:  None. IMPRESSION: The examination is positive for occlusive DVT involving one of the paired right posterior tibial veins. There is no extension of this distal occlusive tibial DVT to the more proximal venous system of the right lower extremity. Electronically Signed   By: Sandi Mariscal M.D.   On: 04/18/2021 14:18   DG Chest Port 1 View  Result Date: 05/03/2021 CLINICAL DATA:  Left lung cancer, recurrent left effusion, status post thoracentesis, 700 cc removed EXAM: PORTABLE CHEST 1 VIEW COMPARISON:  04/04/2021 FINDINGS: Moderately large left effusion remains with left lung collapse/consolidation. No pneumothorax. Right port catheter tip lower SVC level. Stable right lung aeration. Trachea midline. IMPRESSION: No complicating feature following left thoracentesis with residual left effusion and left lung collapse/consolidation noted. Electronically Signed   By: Jerilynn Mages.  Shick M.D.   On: 05/03/2021 15:14   US THORACENTESIS ASP PLEURAL SPACE W/IMG GUIDE  Result Date: 05/03/2021 INDICATION: Recurrent malignant left effusion EXAM: ULTRASOUND  GUIDED LEFT THORACENTESIS MEDICATIONS: 1% lidocaine local COMPLICATIONS: None immediate. PROCEDURE: An ultrasound guided thoracentesis was thoroughly discussed with the patient and questions answered. The benefits, risks, alternatives and complications were also discussed. The patient understands and wishes to proceed with the procedure. Written consent was obtained. Ultrasound was performed to localize and mark an adequate pocket of fluid in the left  chest. The area was then prepped and draped in the normal sterile fashion. 1% Lidocaine was used for local anesthesia. Under ultrasound guidance a 6 Fr Safe-T-Centesis catheter was introduced. Thoracentesis was performed. The catheter was removed and a dressing applied. FINDINGS: A total of approximately 700 cc of dark amber pleural fluid was removed. Samples were sent to the laboratory as requested by the clinical team. IMPRESSION: Successful ultrasound guided left thoracentesis yielding 700 cc of pleural fluid. Electronically Signed   By: Jerilynn Mages.  Shick M.D.   On: 05/03/2021 15:13     Assessment and plan- Patient is a 40 y.o. male who presents for BLE petechiae. On eliquis.   Stage IV lung cancer- Currently on tagrisso and doing well with only a mild rash on his forehead. Reports petechiae on lower extremities X 2 days. No evidence of bleeding. Unclear etiology but likely secondary to combination tagrisso and eliquis. Recommend monitoring at this time.  Platelet count today is 213,000. PT/INR is normal. Slight elevation in white count; mainly neutrophils. He is on steroids. No evidence of infection. No fevers. He feels well. He has follow-up with Dr. Rogue Bussing on Friday. I have asked him to call us if symptoms worsen. He is agreeable.   Disposition- RTC as scheduled on Friday to see Dr. Jacinto Reap.    Visit Diagnosis 1. Petechiae     Patient expressed understanding and was in agreement with this plan. He also understands that He can call clinic at any time with any  questions, concerns, or complaints.   I spent 20 minutes dedicated to the care of this patient (face-to-face and non-face-to-face) on the date of the encounter to include what is described in the assessment and plan.  Thank you for allowing me to participate in the care of this very pleasant patient.    Jacquelin Hawking, NP [place] [facility] Cell - [phone] Pager- [phone] 05/15/2021 6:17 PM

## 2021-05-15 NOTE — Telephone Encounter (Signed)
[name]- call patient/ patient's wife to see when patient can come in today to see Dr. Jacinto Reap- lab/md

## 2021-05-15 NOTE — Telephone Encounter (Signed)
Pt has been scheduled with Sonia Baller in [facility] today. Pt has been made aware.

## 2021-05-19 ENCOUNTER — Inpatient Hospital Stay: Payer: Managed Care, Other (non HMO)

## 2021-05-19 ENCOUNTER — Inpatient Hospital Stay (HOSPITAL_BASED_OUTPATIENT_CLINIC_OR_DEPARTMENT_OTHER): Payer: Managed Care, Other (non HMO) | Admitting: Internal Medicine

## 2021-05-19 ENCOUNTER — Encounter: Payer: Self-pay | Admitting: Internal Medicine

## 2021-05-19 ENCOUNTER — Other Ambulatory Visit: Payer: Self-pay

## 2021-05-19 ENCOUNTER — Ambulatory Visit
Admission: RE | Admit: 2021-05-19 | Discharge: 2021-05-19 | Disposition: A | Discharge status: Home / Self Care | Payer: Managed Care, Other (non HMO) | Source: Home / Self Care | Attending: Internal Medicine | Admitting: Internal Medicine

## 2021-05-19 ENCOUNTER — Ambulatory Visit
Admission: RE | Admit: 2021-05-19 | Discharge: 2021-05-19 | Disposition: A | Discharge status: Home / Self Care | Payer: Managed Care, Other (non HMO) | Source: Ambulatory Visit | Attending: Internal Medicine | Admitting: Internal Medicine

## 2021-05-19 VITALS — BP 108/76 | HR 72 | Temp 97.6°F | Resp 20 | Wt 212.6 lb

## 2021-05-19 DIAGNOSIS — C3432 Malignant neoplasm of lower lobe, left bronchus or lung: Secondary | ICD-10-CM | POA: Insufficient documentation

## 2021-05-19 DIAGNOSIS — C7931 Secondary malignant neoplasm of brain: Secondary | ICD-10-CM | POA: Diagnosis not present

## 2021-05-19 DIAGNOSIS — Z95828 Presence of other vascular implants and grafts: Secondary | ICD-10-CM

## 2021-05-19 DIAGNOSIS — C3492 Malignant neoplasm of unspecified part of left bronchus or lung: Secondary | ICD-10-CM

## 2021-05-19 LAB — CBC WITH DIFFERENTIAL/PLATELET
Abs Immature Granulocytes: 0.92 10*3/uL — ABNORMAL HIGH (ref 0.00–0.07)
Basophils Absolute: 0.1 10*3/uL (ref 0.0–0.1)
Basophils Relative: 1 %
Eosinophils Absolute: 0.2 10*3/uL (ref 0.0–0.5)
Eosinophils Relative: 1 %
HCT: 42.4 % (ref 39.0–52.0)
Hemoglobin: 13.9 g/dL (ref 13.0–17.0)
Immature Granulocytes: 6 %
Lymphocytes Relative: 22 %
Lymphs Abs: 3.1 10*3/uL (ref 0.7–4.0)
MCH: 31.2 pg (ref 26.0–34.0)
MCHC: 32.8 g/dL (ref 30.0–36.0)
MCV: 95.1 fL (ref 80.0–100.0)
Monocytes Absolute: 1.2 10*3/uL — ABNORMAL HIGH (ref 0.1–1.0)
Monocytes Relative: 9 %
Neutro Abs: 8.8 10*3/uL — ABNORMAL HIGH (ref 1.7–7.7)
Neutrophils Relative %: 61 %
Platelets: 207 10*3/uL (ref 150–400)
RBC: 4.46 MIL/uL (ref 4.22–5.81)
RDW: 16.8 % — ABNORMAL HIGH (ref 11.5–15.5)
Smear Review: NORMAL
WBC: 14.4 10*3/uL — ABNORMAL HIGH (ref 4.0–10.5)
nRBC: 0 % (ref 0.0–0.2)

## 2021-05-19 LAB — COMPREHENSIVE METABOLIC PANEL
ALT: 46 U/L — ABNORMAL HIGH (ref 0–44)
AST: 25 U/L (ref 15–41)
Albumin: 3.2 g/dL — ABNORMAL LOW (ref 3.5–5.0)
Alkaline Phosphatase: 91 U/L (ref 38–126)
Anion gap: 5 (ref 5–15)
BUN: 24 mg/dL — ABNORMAL HIGH (ref 6–20)
CO2: 24 mmol/L (ref 22–32)
Calcium: 8.2 mg/dL — ABNORMAL LOW (ref 8.9–10.3)
Chloride: 109 mmol/L (ref 98–111)
Creatinine, Ser: 0.91 mg/dL (ref 0.61–1.24)
GFR, Estimated: >60 mL/min (ref >60)
Glucose, Bld: 83 mg/dL (ref 70–99)
Potassium: 3.7 mmol/L (ref 3.5–5.1)
Sodium: 138 mmol/L (ref 135–145)
Total Bilirubin: 0.2 mg/dL — ABNORMAL LOW (ref 0.3–1.2)
Total Protein: 6.2 g/dL — ABNORMAL LOW (ref 6.5–8.1)

## 2021-05-19 IMAGING — CR DG CHEST 2V
1 series · 2 of 2 positions shown · non-contrast
Comparison: [DATE]

CLINICAL DATA: Left pleural effusion

EXAM:
CHEST - 2 VIEW

[Series 1: dg chest 2 view · 0.14mm/px · 2 of 2 slices shown]
[im 1/2]
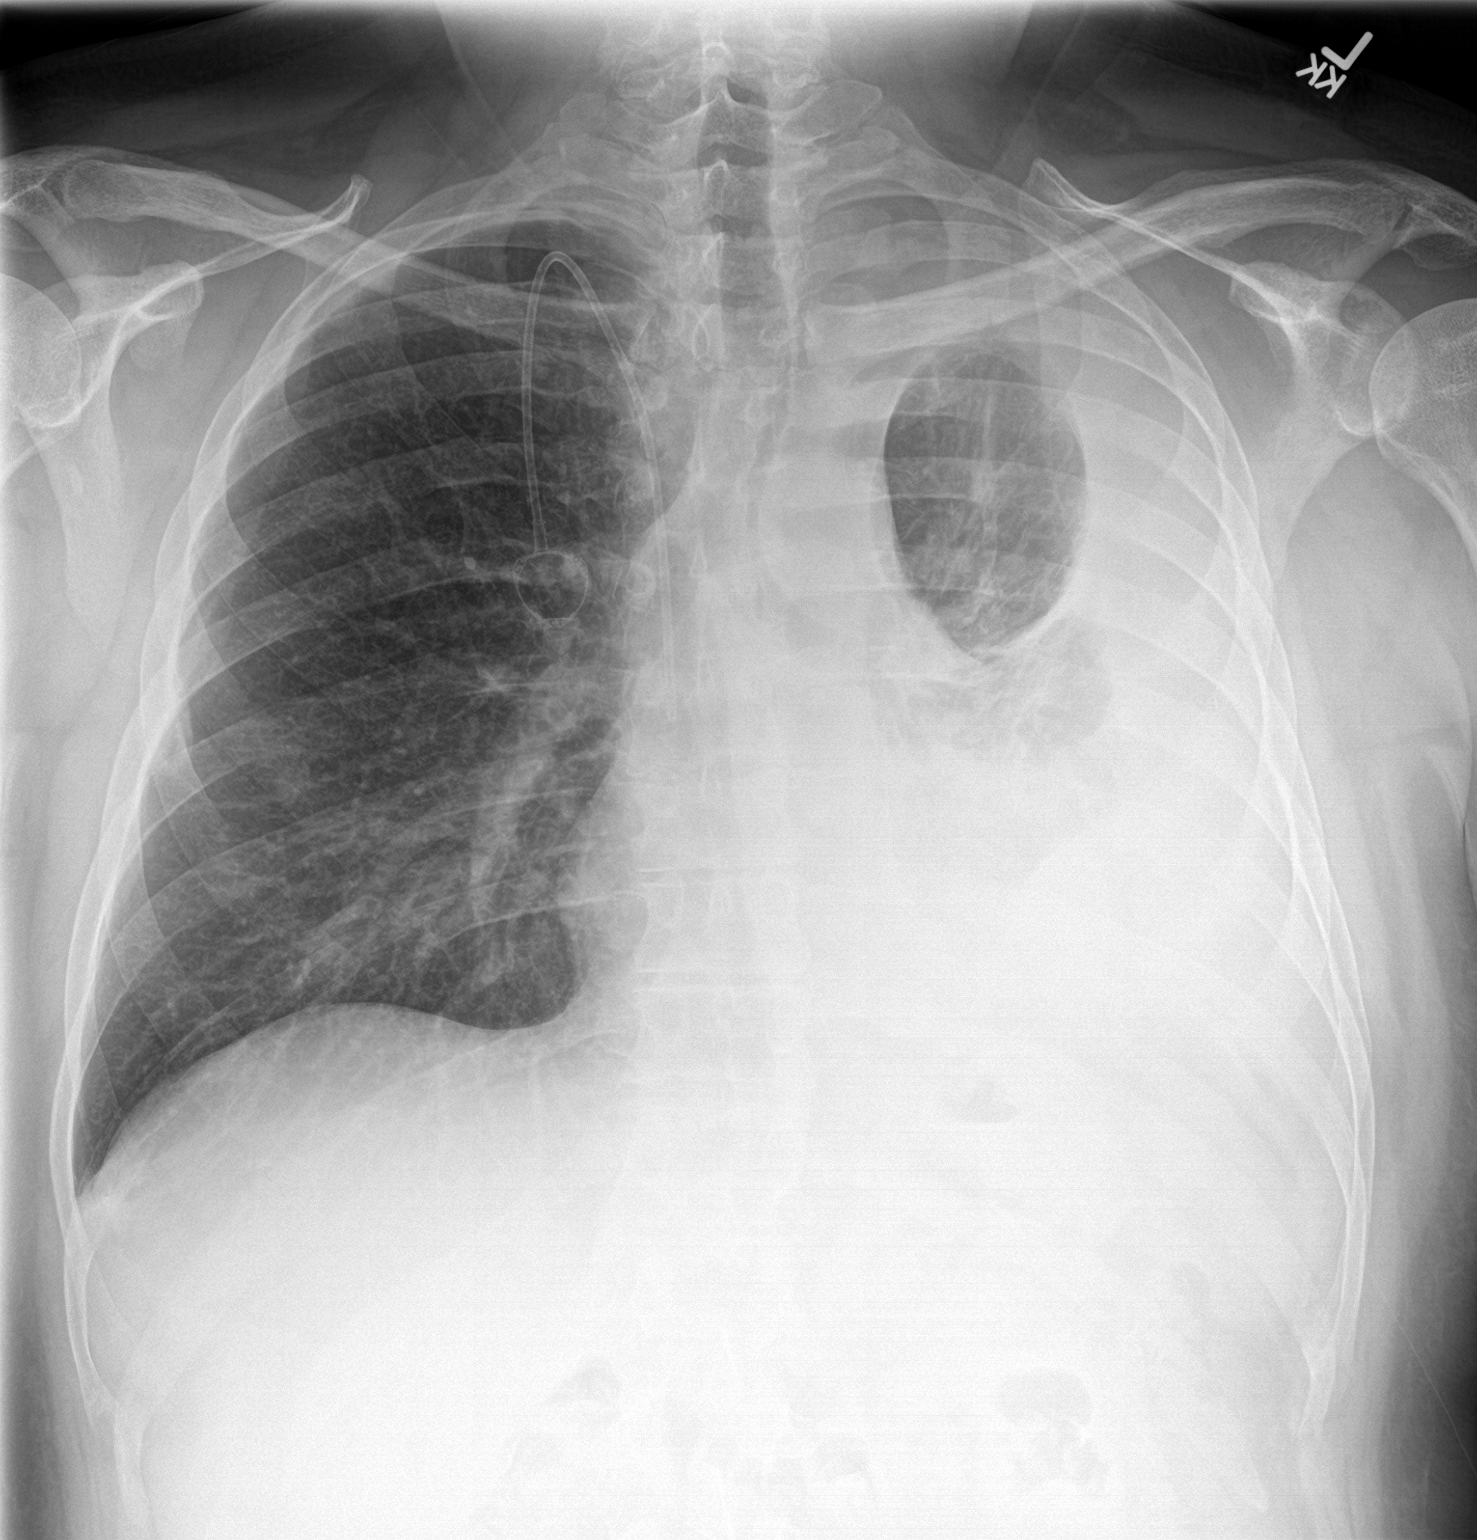
[im 2/2]
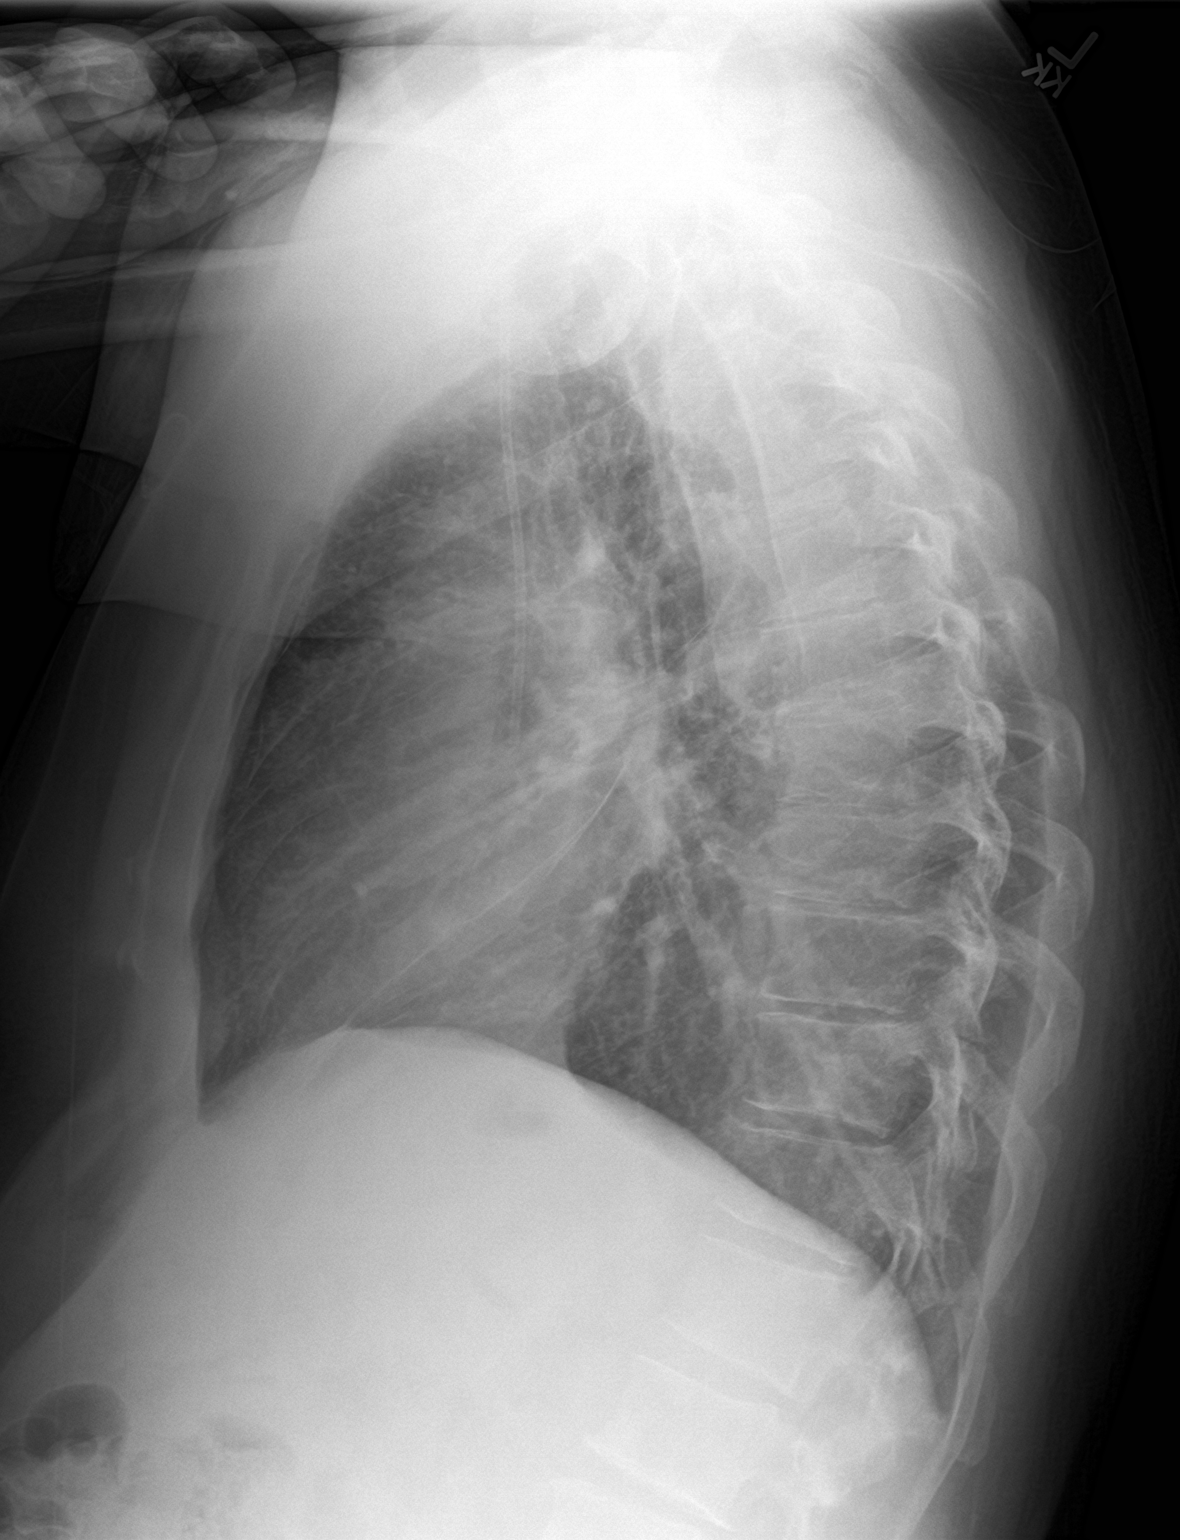

[2 of 2 positions shown; findings below may reference images not displayed]

FINDINGS: Stable to decreased left pleural effusion with obscuration and
opacification of the low volume left lung. Clear right chest. Non
obscured heart size and mediastinal contours are unchanged.
Unremarkable porta catheter on the right.
IMPRESSION: Left chest opacification including pleural effusion that is stable
or mildly improved when compared to post thoracentesis radiograph of
[DATE].

## 2021-05-19 MED ORDER — HEPARIN SOD (PORK) LOCK FLUSH 100 UNIT/ML IV SOLN
500.0000 [IU] | Freq: Once | INTRAVENOUS | Status: AC
  Administered 2021-05-19: 500 [IU]
  Filled 2021-05-19: qty 5

## 2021-05-19 NOTE — Assessment & Plan Note (Addendum)
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 Peritoneal/omental carcinomatosis; Diffuse bone metastases.  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  EGFR- 19 del POSITIVE.  #Currently on osimertinib [since June 28th] ~5 weeks.  Clinically significantly improved.  Monitor closely-we will plan to get CT imaging in 1-2 months.   #Skin rash acne-G-. Likely osimertinib-improved with steroid ointment.  Petechial rash bilateral lower extremities unclear etiology-platelets normal.  Question steroids-see below.  #Multiple brain mets subcentimeter asymptomatic [NO RT]- Taper Dexamethasone 1 mg twice daily. Monitor closely- will plan we will plan repeat brain MRI the next 2 weeks.    #Left-sided pleural effusion recurrent-I would await for osimertinib to work; hold off pleurodesis at this time.  Chest x-ray today.   # ?Symptomatic DVT of the right calf-on Eliquis pain improved.  Continue monitoring  #Bone metastases-recommend Zometa;-however hold Zometa because of low calcium 8.3; ?  Osimertinib.  Continue calcium vitamin D intake.  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 STABLE.   # DISPOSITION: # HOLD zometa; DC needle # follow up in 2 week-MD; labs- cbc/cmppossible zometa; Brain MRI prior--Dr.B

## 2021-05-19 NOTE — Progress Notes (Signed)
Patient here for pre treatment check he reports that he still has a rash on both feet.

## 2021-05-19 NOTE — Progress Notes (Signed)
Nutrition  RD planning to see patient during infusion but infusion cancelled.    RD called patient. No answer.  Left message with call back number.   Alyx Gee B. Zenia Resides, [place], [place] Registered Dietitian [phone] (mobile)

## 2021-05-19 NOTE — Progress Notes (Signed)
Hi-chest x-ray slightly improved compared to previous.  Follow-up as planned.  GB

## 2021-05-19 NOTE — Progress Notes (Signed)
[facility] CONSULT NOTE  Patient Care Team: Pcp, No as PCP - General [name], RN as Oncology Nurse Navigator  CHIEF COMPLAINTS/PURPOSE OF CONSULTATION: Lung cancer  #  Oncology History Overview Note  IMPRESSION: 1. Patchy nodular fat stranding throughout the anterior left upper quadrant peritoneal fat, nonspecific, cannot exclude peritoneal carcinomatosis. Dedicated CT abdomen/pelvis with oral and IV contrast recommended for further evaluation. 2. Dense patchy consolidation replacing much of the left lower lung lobe, appearing masslike in the superior segment left lower lobe, with associated bulging of the left major fissure and associated left lower lobe volume loss. Fine nodularity throughout both lungs with an upper lobe predominance. Asymmetric left upper lobe interlobular septal thickening. These findings are indeterminate, with differential including multilobar pneumonia, sarcoidosis or a neoplastic process. The persistence on radiographs back to 01/20/2021 despite antibiotic therapy make sarcoidosis or a neoplastic process more likely. Pulmonology consultation suggested for consideration of bronchoscopic evaluation. 3. Small dependent left pleural effusion. 4. Mild mediastinal lymphadenopathy, nonspecific. 5. Subacute healing lateral right sixth rib fracture.   DIAGNOSIS:  A. LUNG, LEFT LOWER LOBE; ENB-ASSISTED BIOPSY:  - NON-SMALL CELL CARCINOMA, FAVOR ADENOCARCINOMA.  - FOREIGN MATERIAL SUGGESTIVE OF ASPIRATION.   # EGFR MUTATED: 36 del- June 28th, 2022- [name].    Cancer of lower lobe of left lung ([place])  03/23/2021 Initial Diagnosis   Cancer of lower lobe of left lung ([place])   03/29/2021 Cancer Staging   Staging form: Lung, AJCC 8th Edition - Clinical: Stage IVB (cT3, cN3, pM1c) - Signed by [name], MD on 03/29/2021    03/30/2021 -  Chemotherapy    Patient is on Treatment Plan: LUNG NSCLC PEMETREXED (ALIMTA) /  CARBOPLATIN Q21D X 1 CYCLES          HISTORY OF PRESENTING ILLNESS:  [name] 40 y.o.  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    Patient was seen in clinic because of rash on his legs.  Noted to have approximately week ago.  Appears petechial denies any trauma.  No bug bites.  Patient denies any worsening shortness of breath.  Denies any cough.  No headaches.  No seizures.  Rash on his chest improved with steroid ointment.    Review of Systems  Constitutional:  Positive for malaise/fatigue and weight loss. Negative for chills, diaphoresis and fever.  HENT:  Negative for nosebleeds and sore throat.   Eyes:  Negative for double vision.  Respiratory:  Positive for shortness of breath. Negative for wheezing.   Cardiovascular:  Negative for chest pain, palpitations, orthopnea and leg swelling.  Gastrointestinal:  Negative for abdominal pain, blood in stool, diarrhea, heartburn, melena, nausea and vomiting.  Genitourinary:  Negative for dysuria, frequency and urgency.  Musculoskeletal:  Negative for back pain and joint pain.  Skin:  Positive for rash. Negative for itching.  Neurological:  Negative for dizziness, tingling, focal weakness, weakness and headaches.  Endo/Heme/Allergies:  Does not bruise/bleed easily.  Psychiatric/Behavioral:  Negative for depression. The patient is not nervous/anxious and does not have insomnia.     MEDICAL HISTORY:  Past Medical History:  Diagnosis Date  . Cancer ([place])   . Dyspnea   . Heartburn   . Pneumonia   . 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 FRACTURE SURGERY  broke femur when he was 6 years  . PORTA CATH INSERTION N/A 03/28/2021   Procedure: PORTA CATH INSERTION;  Surgeon: Katha Cabal, MD;  Location: [facility];  Service:  Cardiovascular;  Laterality: N/A;  . VIDEO BRONCHOSCOPY WITH ENDOBRONCHIAL NAVIGATION N/A 03/15/2021   Procedure: VIDEO BRONCHOSCOPY WITH ENDOBRONCHIAL NAVIGATION;  Surgeon: Ottie Glazier, MD;  Location: [facility];  Service: Thoracic;  Laterality: N/A;  . VIDEO BRONCHOSCOPY WITH ENDOBRONCHIAL ULTRASOUND N/A 03/15/2021   Procedure: VIDEO BRONCHOSCOPY WITH ENDOBRONCHIAL ULTRASOUND;  Surgeon: Ottie Glazier, MD;  Location: [facility];  Service: Thoracic;  Laterality: N/A;    SOCIAL HISTORY: Social History   Socioeconomic History  . Marital status: Married    Spouse name: [name]  . Number of children: 2  . Years of education: Not on file  . Highest education level: Not on file  Occupational History  . Not on file  Tobacco Use  . Smoking status: Never  . Smokeless tobacco: Current    Types: Snuff, Chew  Vaping Use  . Vaping Use: Never used  Substance and Sexual Activity  . Alcohol use: Never  . Drug use: Never  . Sexual activity: Not on file  Other Topics Concern  . Not on file  Social History Narrative   [place]; with wife; 2 daughters[12 and 56]; never smoked; rare alcohol. Work in [employer]    Social Determinants of Health   Financial Resource Strain: Not on file  Food Insecurity: Not on file  Transportation Needs: Not on file  Physical Activity: Not on file  Stress: Not on file  Social Connections: Not on file  Intimate Partner Violence: Not on file    FAMILY HISTORY: Family History  Problem Relation Age of Onset  . High Cholesterol Mother   . Hypertension Father   . Cancer Father   . Lung cancer Father   . Skin cancer Father     ALLERGIES:  is allergic to codeine.  MEDICATIONS:  Current Outpatient Medications  Medication Sig Dispense Refill  . apixaban (ELIQUIS) 5 MG TABS tablet Take 1 tablet (39m) twice daily 60 tablet 2  . clindamycin (CLINDAGEL) 1 % gel Apply topically 2 (two) times daily. Apply to affected areas twice a day. 30 g 0  .  dexamethasone (DECADRON) 2 MG tablet Take 1 tablet (2 mg total) by mouth 2 (two) times daily. 60 tablet 0  . folic acid (FOLVITE) 1 MG tablet Take 1 tablet (1 mg total) by mouth daily. 90 tablet 1  . lidocaine-prilocaine (EMLA) cream Apply 1 application topically as needed. 30 g 0  . LORazepam (ATIVAN) 0.5 MG tablet Take 1 tablet (0.5 mg total) by mouth every 8 (eight) hours. 30 tablet 0  . metoprolol succinate (TOPROL XL) 25 MG 24 hr tablet Take 1 tablet (25 mg total) by mouth daily. 30 tablet 1  . osimertinib mesylate (TAGRISSO) 80 MG tablet Take 1 tablet (80 mg total) by mouth daily. 30 tablet 4  . sertraline (ZOLOFT) 50 MG tablet Take 0.5 tablets (25 mg total) by mouth daily. 30 tablet 0  . chlorpheniramine-HYDROcodone (TUSSIONEX) 10-8 MG/5ML SUER Take 5 mLs by mouth every 12 (twelve) hours as needed for cough. (Patient not taking: No sig reported) 140 mL 0  . ondansetron (ZOFRAN) 8 MG tablet One pill every 8 hours as needed for nausea/vomitting. (Patient not taking: No sig reported) 40 tablet 1  . prochlorperazine (COMPAZINE) 10 MG tablet Take 1 tablet (10 mg total) by mouth  every 6 (six) hours as needed for nausea or vomiting. (Patient not taking: No sig reported) 40 tablet 1   No current facility-administered medications for this visit.      Marland Kitchen  PHYSICAL EXAMINATION: ECOG PERFORMANCE STATUS: 1 - Symptomatic but completely ambulatory  Vitals:   05/19/21 0921  BP: 108/76  Pulse: 72  Resp: 20  Temp: 97.6 F (36.4 C)  SpO2: 96%   Filed Weights   05/19/21 0921  Weight: 212 lb 9.6 oz (96.4 kg)    Physical Exam Constitutional:      Comments: Ambulating independently.  With his wife.  HENT:     Head: Normocephalic and atraumatic.     Mouth/Throat:     Pharynx: No oropharyngeal exudate.  Eyes:     Pupils: Pupils are equal, round, and reactive to light.  Cardiovascular:     Rate and Rhythm: Normal rate and regular rhythm.  Pulmonary:     Effort: No respiratory distress.      Breath sounds: No wheezing.     Comments: Decreased breath sound on the left side compared to right. Abdominal:     General: Bowel sounds are normal. There is no distension.     Palpations: Abdomen is soft. There is no mass.     Tenderness: There is no abdominal tenderness. There is no guarding or rebound.  Musculoskeletal:        General: No tenderness. Normal range of motion.     Cervical back: Normal range of motion and neck supple.  Skin:    General: Skin is warm.     Comments: Acne-like rash noted in the forehead.  Petechial rash on the bilateral lower extremities.  Neurological:     Mental Status: He is alert and oriented to person, place, and time.  Psychiatric:        Mood and Affect: Affect normal.     LABORATORY DATA:  I have reviewed the data as listed Lab Results  Component Value Date   WBC 14.4 (H) 05/19/2021   HGB 13.9 05/19/2021   HCT 42.4 05/19/2021   MCV 95.1 05/19/2021   PLT 207 05/19/2021   Recent Labs    04/20/21 0828 05/05/21 0818 05/15/21 1257 05/19/21 0909  NA 136 137 136 138  K 3.9 3.9 3.9 3.7  CL 105 106 104 109  CO2 _0 GLUCOSE 93 96 90 83  BUN _1 24*  CREATININE 0.79 0.94 0.92 0.91  CALCIUM 8.4* 8.3* 8.5* 8.2*  GFRNONAA >60 >60 >60 >60  PROT 6.0* 6.0*  --  6.2*  ALBUMIN 2.9* 3.1*  --  3.2*  AST 27 21  --  25  ALT 89* 47*  --  46*  ALKPHOS 292* 139*  --  91  BILITOT 0.5 0.4  --  0.2*    RADIOGRAPHIC STUDIES: I have personally reviewed the radiological images as listed and agreed with the findings in the report. DG Chest Port 1 View  Result Date: 05/03/2021 CLINICAL DATA:  Left lung cancer, recurrent left effusion, status post thoracentesis, 700 cc removed EXAM: PORTABLE CHEST 1 VIEW COMPARISON:  04/04/2021 FINDINGS: Moderately large left effusion remains with left lung collapse/consolidation. No pneumothorax. Right port catheter tip lower SVC level. Stable right lung aeration. Trachea midline. IMPRESSION: No  complicating feature following left thoracentesis with residual left effusion and left lung collapse/consolidation noted. Electronically Signed   By: Jerilynn Mages.  Shick M.D.   On: 05/03/2021 15:14   US THORACENTESIS ASP PLEURAL  SPACE W/IMG GUIDE  Result Date: 05/03/2021 INDICATION: Recurrent malignant left effusion EXAM: ULTRASOUND GUIDED LEFT THORACENTESIS MEDICATIONS: 1% lidocaine local COMPLICATIONS: None immediate. PROCEDURE: An ultrasound guided thoracentesis was thoroughly discussed with the patient and questions answered. The benefits, risks, alternatives and complications were also discussed. The patient understands and wishes to proceed with the procedure. Written consent was obtained. Ultrasound was performed to localize and mark an adequate pocket of fluid in the left chest. The area was then prepped and draped in the normal sterile fashion. 1% Lidocaine was used for local anesthesia. Under ultrasound guidance a 6 Fr Safe-T-Centesis catheter was introduced. Thoracentesis was performed. The catheter was removed and a dressing applied. FINDINGS: A total of approximately 700 cc of dark amber pleural fluid was removed. Samples were sent to the laboratory as requested by the clinical team. IMPRESSION: Successful ultrasound guided left thoracentesis yielding 700 cc of pleural fluid. Electronically Signed   By: Jerilynn Mages.  Shick M.D.   On: 05/03/2021 15:13    ASSESSMENT & PLAN:   Cancer of lower lobe of left lung ([place]) # STAGE IV- Left upper lobe lung cancer-adenocarcinoma the lung- PET June 2022- Left lung collapse with 7 cm hypermetabolic masslike opacity centered in the left lower lobe and involving the left hilum; Large malignant left pleural effusion;  Hypermetabolic lymphadenopathy in mediastinum and left supraclavicular region. Peritoneal/omental carcinomatosis; Diffuse bone metastases.  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  EGFR- 19 del POSITIVE.  #Currently on osimertinib [since June  28th] ~5 weeks.  Clinically significantly improved.  Monitor closely-we will plan to get CT imaging in 1-2 months.   #Skin rash acne-G-. Likely osimertinib-improved with steroid ointment.  Petechial rash bilateral lower extremities unclear etiology-platelets normal.  Question steroids-see below.  #Multiple brain mets subcentimeter asymptomatic [NO RT]- Taper Dexamethasone 1 mg twice daily. Monitor closely- will plan we will plan repeat brain MRI the next 2 weeks.    #Left-sided pleural effusion recurrent-I would await for osimertinib to work; hold off pleurodesis at this time.  Chest x-ray today.   # ?Symptomatic DVT of the right calf-on Eliquis pain improved.  Continue monitoring  #Bone metastases-recommend Zometa;-however hold Zometa because of low calcium 8.3; ?  Osimertinib.  Continue calcium vitamin D intake.  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 STABLE.   # DISPOSITION: # HOLD zometa; DC needle # follow up in 2 week-MD; labs- cbc/cmppossible zometa; Brain MRI prior--Dr.B    All questions were answered. The patient knows to call the clinic with any problems, questions or concerns.    [name], MD 05/19/2021 11:31 AM

## 2021-05-23 ENCOUNTER — Telehealth: Payer: Self-pay | Admitting: *Deleted

## 2021-05-23 DIAGNOSIS — C3492 Malignant neoplasm of unspecified part of left bronchus or lung: Secondary | ICD-10-CM

## 2021-05-23 MED ORDER — SERTRALINE HCL 50 MG PO TABS: 25.0000 mg | ORAL_TABLET | Freq: Every day | ORAL | 2 refills | Status: DC

## 2021-05-23 NOTE — Telephone Encounter (Signed)
Pt requests refill of zoloft prescribed during last hospital admission. Refill sent into [facility] pharmacy.

## 2021-05-24 ENCOUNTER — Telehealth: Payer: Self-pay | Admitting: *Deleted

## 2021-05-24 NOTE — Telephone Encounter (Signed)
[name]- will you reach out to the patient to follow-up on symptoms. He may need an [facility] apt.

## 2021-05-24 NOTE — Telephone Encounter (Signed)
Pt stated that he was feeling better than he did last night. He has not had any vomiting today, and only had diarrhea once. States he had fever of 100.5 last night, but no fever this morning. States that he tested negative for covid. He's been able to drink a Gatorade and eat a pack of crackers this morning. He denies any nausea this morning. He declines an offer to come in to [facility] today. He states that he will try some immodium if the diarrhea returns. I also encouraged him to stay hydrated and to try bland foods or BRAT diet. He will call us back if symptoms persist.

## 2021-05-24 NOTE — Telephone Encounter (Signed)
Patient called to report that he has had seveal episodes of diarrhea starting yesterday and he has vomited once last night. He has not taken any anti-diarrheal medication or ondansetron.

## 2021-05-25 ENCOUNTER — Encounter: Payer: Self-pay | Admitting: Internal Medicine

## 2021-05-25 MED ORDER — DEXAMETHASONE 1 MG PO TABS: 1.0000 mg | ORAL_TABLET | Freq: Two times a day (BID) | ORAL | 1 refills | Status: DC

## 2021-05-30 ENCOUNTER — Other Ambulatory Visit: Payer: Self-pay

## 2021-05-30 ENCOUNTER — Ambulatory Visit
Admission: RE | Admit: 2021-05-30 | Discharge: 2021-05-30 | Disposition: A | Discharge status: Home / Self Care | Payer: Managed Care, Other (non HMO) | Source: Ambulatory Visit | Attending: Internal Medicine | Admitting: Internal Medicine

## 2021-05-30 DIAGNOSIS — C7931 Secondary malignant neoplasm of brain: Secondary | ICD-10-CM | POA: Insufficient documentation

## 2021-05-30 DIAGNOSIS — C3432 Malignant neoplasm of lower lobe, left bronchus or lung: Secondary | ICD-10-CM | POA: Diagnosis present

## 2021-05-30 IMAGING — MR MR HEAD WO/W CM
14 series · 48 of 48 positions shown · IV contrast (9ml Gadavist)
Comparison: [DATE]

CLINICAL DATA: Lung cancer staging.

EXAM:
MRI HEAD WITHOUT AND WITH CONTRAST
TECHNIQUE: Multiplanar, multiecho pulse sequences of the brain and surrounding
structures were obtained without and with intravenous contrast.
CONTRAST:  9mL GADAVIST GADOBUTROL 1 MMOL/ML IV SOLN

[Series 5: ax dwi_tracew · axial · 3.0mm · 0.65mm/px · z∈[-75,+80]mm · 3 of 48 slices shown]
[im 1/48]
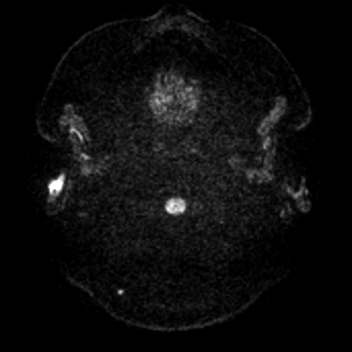
[im 24/48]
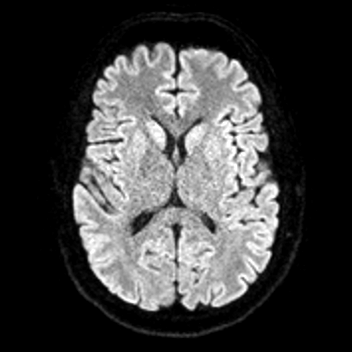
[im 48/48]
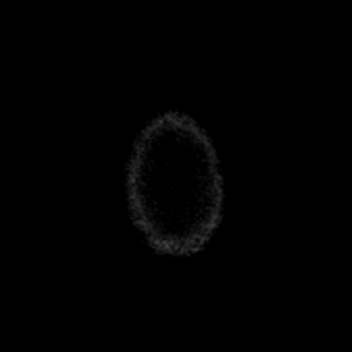

[Series 6: ax dwi_adc · axial · 3.0mm · 0.65mm/px · z∈[-75,+80]mm · 3 of 48 slices shown]
[im 1/48]
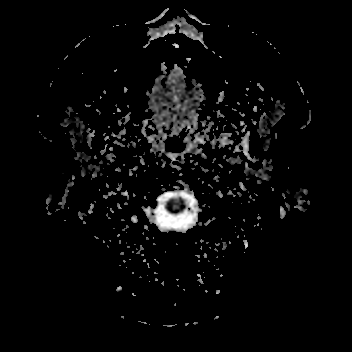
[im 24/48]
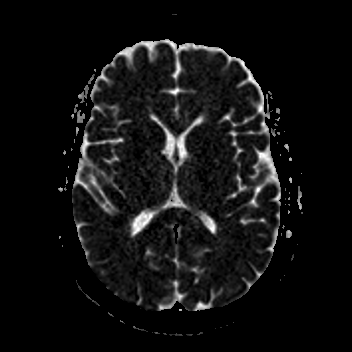
[im 48/48]
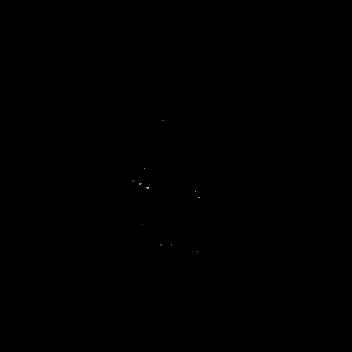

[Series 7: cor dwi_tracew · coronal · 5.0mm · 0.68mm/px · 2 of 40 slices shown]
[im 1/40]
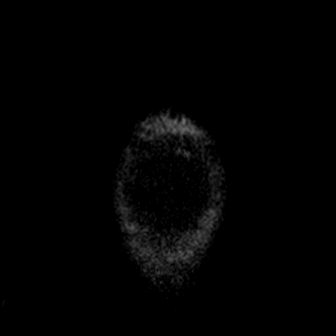
[im 40/40]
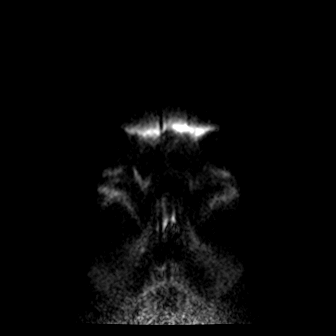

[Series 8: cor dwi_adc · coronal · 5.0mm · 0.68mm/px · 2 of 40 slices shown]
[im 1/40]
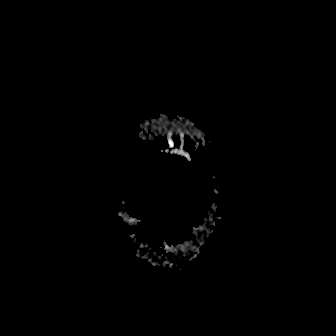
[im 40/40]
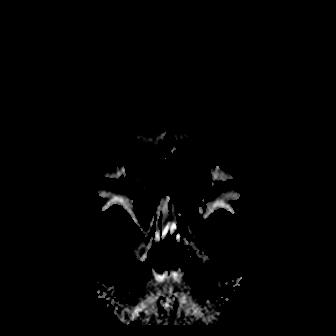

[Series 9: T1 · sagittal · 5.0mm · 0.62mm/px · 1 of 25 slices shown (1 of 2)]
[im 1/25]
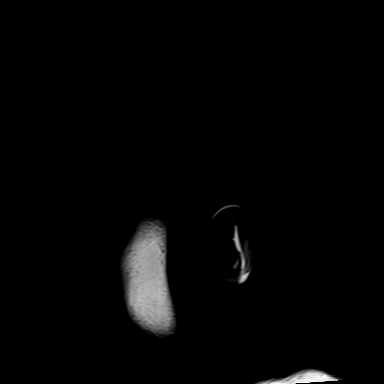

[Series 10: T2 · axial · 5.0mm · 0.53mm/px · z∈[-81,+86]mm · 2 of 29 slices shown]
[im 1/29]
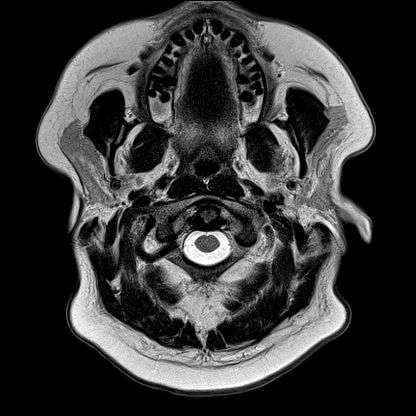
[im 29/29]
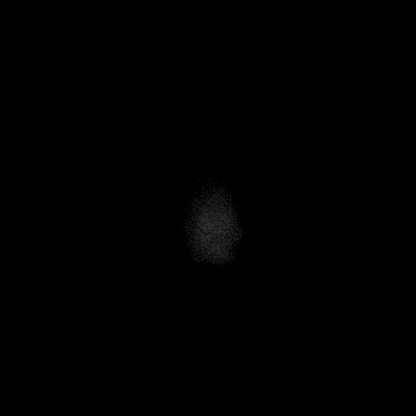

[Series 12: pha_images · axial · 3.0mm · 0.90mm/px · z∈[-80,+85]mm · 3 of 56 slices shown]
[im 1/56]
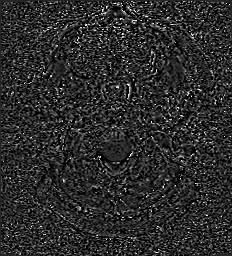
[im 28/56]
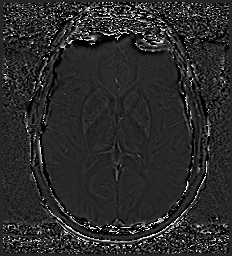
[im 56/56]
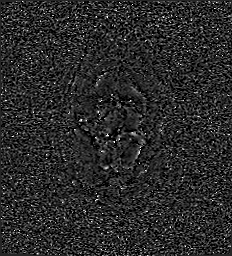

[Series 13: swi_images · axial · 3.0mm · 0.90mm/px · z∈[-80,+85]mm · 3 of 56 slices shown]
[im 1/56]
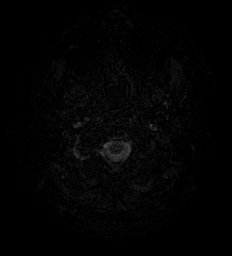
[im 28/56]
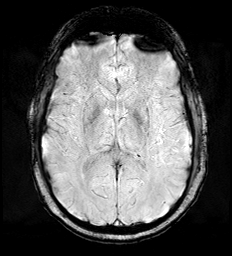
[im 56/56]
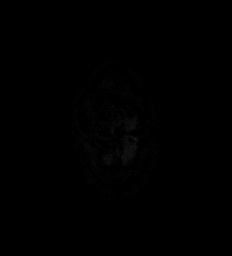

[Series 15: FLAIR · axial · 3.0mm · 0.69mm/px · z∈[-78,+83]mm · 3 of 55 slices shown]
[im 1/55]
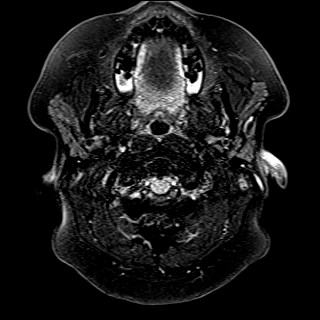
[im 28/55]
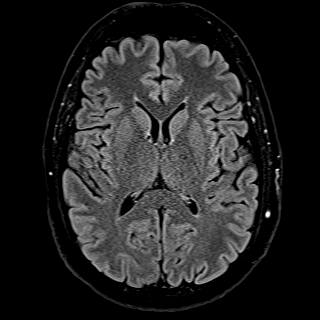
[im 55/55]
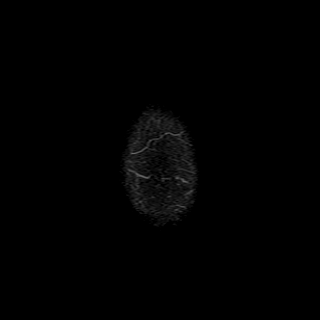

[Series 16: T1 · axial · 1.0mm · 0.98mm/px · z∈[-93,+96]mm · 10 of 187 slices shown (2 of 2)]
[im 1/187]
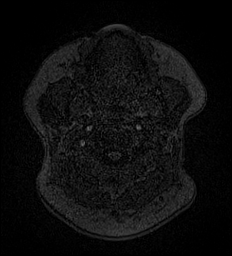
[im 21/187]
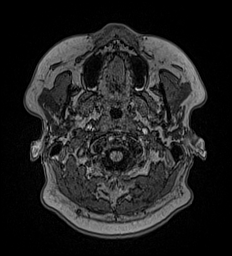
[im 42/187]
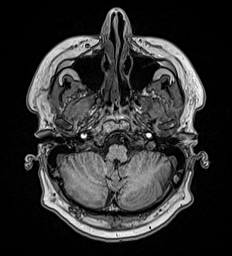
[im 63/187]
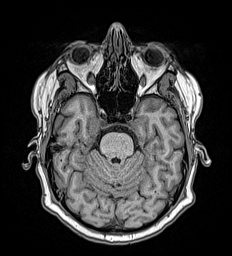
[im 83/187]
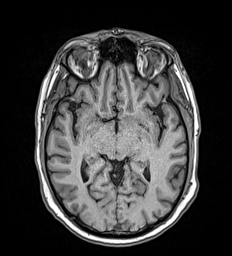
[im 104/187]
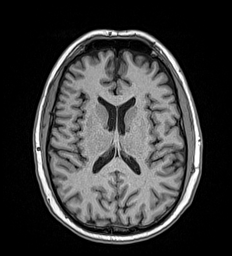
[im 125/187]
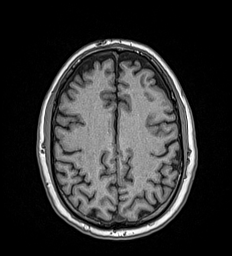
[im 145/187]
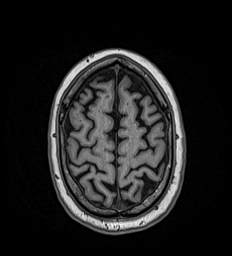
[im 166/187]
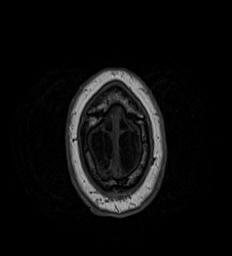
[im 187/187]
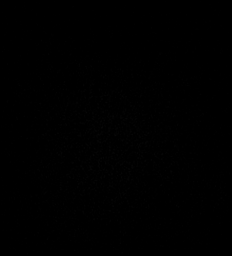

[Series 17: T2 post-contrast · coronal · 5.0mm · 0.69mm/px · 2 of 33 slices shown]
[im 1/33]
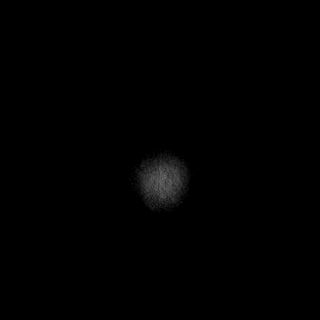
[im 33/33]
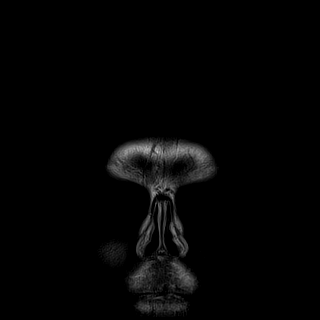

[Series 18: T1 post-contrast · axial · 1.0mm · 0.98mm/px · z∈[-93,+97]mm · 11 of 191 slices shown (1 of 3)]
[im 1/191]
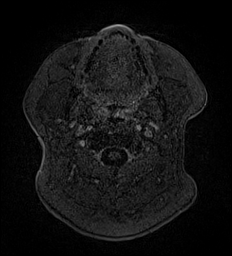
[im 20/191]
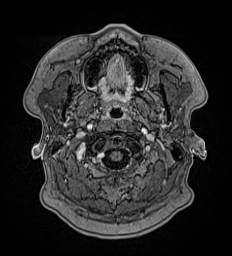
[im 39/191]
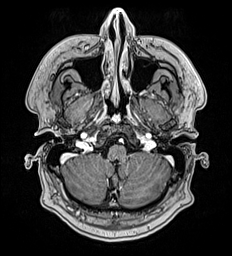
[im 58/191]
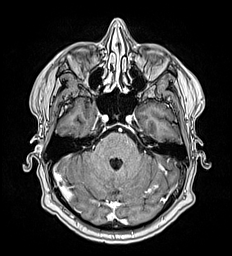
[im 77/191]
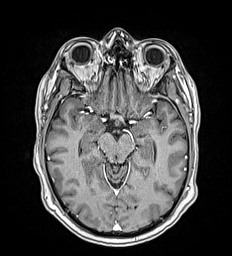
[im 96/191]
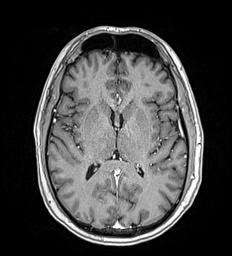
[im 115/191]
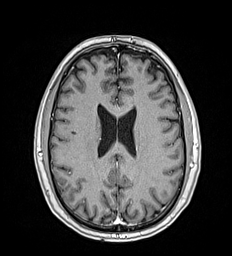
[im 134/191]
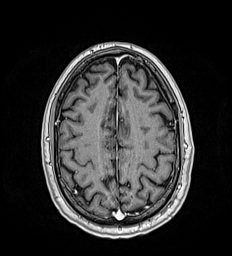
[im 153/191]
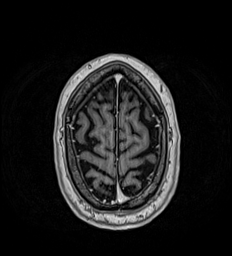
[im 172/191]
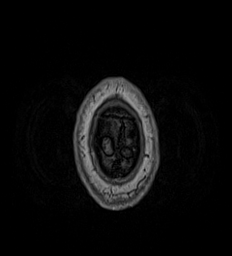
[im 191/191]
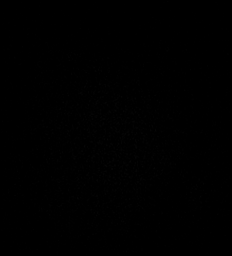

[Series 19: T1 post-contrast · coronal · 5.0mm · 0.57mm/px · 2 of 33 slices shown (2 of 3)]
[im 1/33]
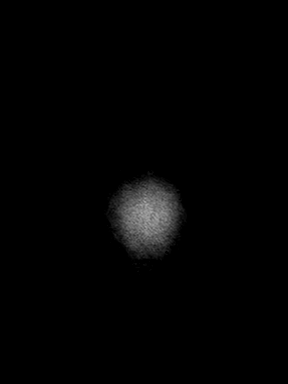
[im 33/33]
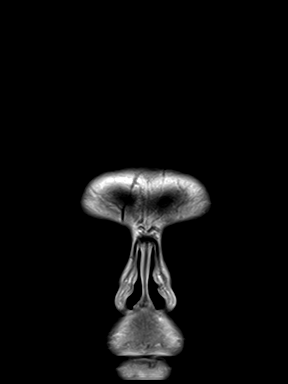

[Series 20: T1 post-contrast · sagittal · 5.0mm · 0.62mm/px · 1 of 25 slices shown (3 of 3)]
[im 1/25]
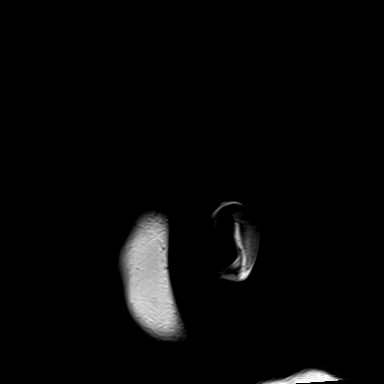

[48 of 48 positions shown; findings below may reference images not displayed]

FINDINGS: Brain: Interval significant decrease or resolution of previously
noted contrast enhancing lesions throughout brain, with resolution
of associated T2 hyperintense signal. Remaining lesions include a 3
mm right paramedian frontal lesion (series 18, image 147),
previously 11 mm; a left lateral posterior frontal lobe lesion
measuring up to 2 mm (series 18, image 127), previously 6 mm; a
right posterior parietal lesion measuring up to 5 mm (series 18,
image 126), previously 9 mm; the left frontal lobe lesion measuring
up to 4 mm (series 18, image 124), previously 9 mm; a 5 mm right
inferior parietal lesion (series 18, image 22), previously 10 mm.
Previously noted cerebellar lesions are no longer seen. No new
lesions.

No acute infarct, hemorrhage, hydrocephalus, or extra-axial
collection. No mass effect or midline shift.

Vascular: Normal flow voids.

Skull and upper cervical spine: Normal marrow signal.

Sinuses/Orbits: Mucous retention cyst in left maxillary sinus.
Otherwise unremarkable. Normal orbits.

Other: None.
IMPRESSION: Interval resolution or significant decrease in the size of
previously noted contrast-enhancing lesions in the brain, likely
reflecting treatment response. No new metastatic lesions.

## 2021-05-30 MED ORDER — GADOBUTROL 1 MMOL/ML IV SOLN
9.0000 mL | Freq: Once | INTRAVENOUS | Status: AC | PRN
  Administered 2021-05-30: 9 mL via INTRAVENOUS

## 2021-05-31 ENCOUNTER — Telehealth: Payer: Self-pay | Admitting: Internal Medicine

## 2021-05-31 NOTE — Telephone Encounter (Signed)
On 8/17- I called pt/LVM that MRI brain improving follow-up as planned.

## 2021-06-02 ENCOUNTER — Encounter: Payer: Self-pay | Admitting: Internal Medicine

## 2021-06-02 ENCOUNTER — Inpatient Hospital Stay (HOSPITAL_BASED_OUTPATIENT_CLINIC_OR_DEPARTMENT_OTHER): Payer: Managed Care, Other (non HMO) | Admitting: Internal Medicine

## 2021-06-02 ENCOUNTER — Inpatient Hospital Stay: Payer: Managed Care, Other (non HMO)

## 2021-06-02 DIAGNOSIS — C3432 Malignant neoplasm of lower lobe, left bronchus or lung: Secondary | ICD-10-CM | POA: Diagnosis not present

## 2021-06-02 DIAGNOSIS — C3492 Malignant neoplasm of unspecified part of left bronchus or lung: Secondary | ICD-10-CM

## 2021-06-02 LAB — COMPREHENSIVE METABOLIC PANEL
ALT: 41 U/L (ref 0–44)
AST: 23 U/L (ref 15–41)
Albumin: 3 g/dL — ABNORMAL LOW (ref 3.5–5.0)
Alkaline Phosphatase: 86 U/L (ref 38–126)
Anion gap: 6 (ref 5–15)
BUN: 9 mg/dL (ref 6–20)
CO2: 25 mmol/L (ref 22–32)
Calcium: 8 mg/dL — ABNORMAL LOW (ref 8.9–10.3)
Chloride: 106 mmol/L (ref 98–111)
Creatinine, Ser: 0.88 mg/dL (ref 0.61–1.24)
GFR, Estimated: >60 mL/min (ref >60)
Glucose, Bld: 108 mg/dL — ABNORMAL HIGH (ref 70–99)
Potassium: 3.3 mmol/L — ABNORMAL LOW (ref 3.5–5.1)
Sodium: 137 mmol/L (ref 135–145)
Total Bilirubin: 0.4 mg/dL (ref 0.3–1.2)
Total Protein: 6 g/dL — ABNORMAL LOW (ref 6.5–8.1)

## 2021-06-02 LAB — CBC WITH DIFFERENTIAL/PLATELET
Abs Immature Granulocytes: 0.62 10*3/uL — ABNORMAL HIGH (ref 0.00–0.07)
Basophils Absolute: 0.1 10*3/uL (ref 0.0–0.1)
Basophils Relative: 1 %
Eosinophils Absolute: 0.1 10*3/uL (ref 0.0–0.5)
Eosinophils Relative: 1 %
HCT: 40.8 % (ref 39.0–52.0)
Hemoglobin: 13.3 g/dL (ref 13.0–17.0)
Immature Granulocytes: 5 %
Lymphocytes Relative: 19 %
Lymphs Abs: 2.2 10*3/uL (ref 0.7–4.0)
MCH: 30.9 pg (ref 26.0–34.0)
MCHC: 32.6 g/dL (ref 30.0–36.0)
MCV: 94.9 fL (ref 80.0–100.0)
Monocytes Absolute: 1 10*3/uL (ref 0.1–1.0)
Monocytes Relative: 9 %
Neutro Abs: 7.5 10*3/uL (ref 1.7–7.7)
Neutrophils Relative %: 65 %
Platelets: 259 10*3/uL (ref 150–400)
RBC: 4.3 MIL/uL (ref 4.22–5.81)
RDW: 17.3 % — ABNORMAL HIGH (ref 11.5–15.5)
WBC: 11.5 10*3/uL — ABNORMAL HIGH (ref 4.0–10.5)
nRBC: 0 % (ref 0.0–0.2)

## 2021-06-02 MED ORDER — HEPARIN SOD (PORK) LOCK FLUSH 100 UNIT/ML IV SOLN
INTRAVENOUS | Status: AC
  Administered 2021-06-02: 500 [IU]
  Filled 2021-06-02: qty 5

## 2021-06-02 NOTE — Patient Instructions (Addendum)
#   Taper Dexamethasone 1 mg once a day for 1 week and then 0.5 mg once a day for 1 week and stop

## 2021-06-02 NOTE — Assessment & Plan Note (Addendum)
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 Peritoneal/omental carcinomatosis; Diffuse bone metastases.  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  EGFR- 19 del POSITIVE.  #Currently on osimertinib [since June 28th] ~2 months; will order imaging in 1 month/next visit.  #Skin rash acne-G-. Likely osimertinib-improved with steroid ointment.  Petechial rash bilateral lower extremities unclear etiology-platelets normal.  Question steroids-see below.  #Multiple brain mets subcentimeter asymptomatic [NO RT]- . MRI brain AUG 16th-significant response noted. Taper Dexamethasone 1 mg once a day for 1 week and then 0.5 mg once a day for 1 week and stop.    #Left-sided pleural effusion recurrent-I would await for osimertinib to work; hold off pleurodesis at this time.  Await above imaging.  # ?Symptomatic DVT of the right calf-on Eliquis pain improved.  Continue monitoring.  #Bone metastases-recommend Zometa;-however hold Zometa because of low calcium 8.3; ?  Osimertinib.  Continue calcium vitamin D intake...  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 STABLE.  #Again reviewed the prognosis-unfortunately incurable; 3-4 years based upon response/further treatment options progression of disease.  # DISPOSITION: # HOLD zometa; de-access # follow up in 4 week-MD; labs- cbc/cmp; possible zometa; PET scan--Dr.B

## 2021-06-02 NOTE — Progress Notes (Signed)
Having bilateral leg and ankle swelling that he just noticed. Still has rash on both legs.

## 2021-06-07 ENCOUNTER — Encounter: Payer: Self-pay | Admitting: *Deleted

## 2021-06-08 ENCOUNTER — Telehealth: Payer: Self-pay | Admitting: *Deleted

## 2021-06-08 NOTE — Telephone Encounter (Signed)
Patient msg back. His dentist- Dr. Gershon Mussel in [place] can be reached at [phone]

## 2021-06-11 ENCOUNTER — Encounter: Payer: Self-pay | Admitting: Nurse Practitioner

## 2021-06-11 ENCOUNTER — Encounter: Payer: Self-pay | Admitting: Internal Medicine

## 2021-06-11 NOTE — Progress Notes (Signed)
[place] CONSULT NOTE  Patient Care Team: Pcp, No as PCP - General [name], RN as Oncology Nurse Navigator  CHIEF COMPLAINTS/PURPOSE OF CONSULTATION: Lung cancer  #  Oncology History Overview Note  IMPRESSION: 1. Patchy nodular fat stranding throughout the anterior left upper quadrant peritoneal fat, nonspecific, cannot exclude peritoneal carcinomatosis. Dedicated CT abdomen/pelvis with oral and IV contrast recommended for further evaluation. 2. Dense patchy consolidation replacing much of the left lower lung lobe, appearing masslike in the superior segment left lower lobe, with associated bulging of the left major fissure and associated left lower lobe volume loss. Fine nodularity throughout both lungs with an upper lobe predominance. Asymmetric left upper lobe interlobular septal thickening. These findings are indeterminate, with differential including multilobar pneumonia, sarcoidosis or a neoplastic process. The persistence on radiographs back to 01/20/2021 despite antibiotic therapy make sarcoidosis or a neoplastic process more likely. Pulmonology consultation suggested for consideration of bronchoscopic evaluation. 3. Small dependent left pleural effusion. 4. Mild mediastinal lymphadenopathy, nonspecific. 5. Subacute healing lateral right sixth rib fracture.   DIAGNOSIS:  A. LUNG, LEFT LOWER LOBE; ENB-ASSISTED BIOPSY:  - NON-SMALL CELL CARCINOMA, FAVOR ADENOCARCINOMA.  - FOREIGN MATERIAL SUGGESTIVE OF ASPIRATION.   # EGFR MUTATED: 60 del- June 28th, 2022- [name].    Cancer of lower lobe of left lung ([place])  03/23/2021 Initial Diagnosis   Cancer of lower lobe of left lung ([place])   03/29/2021 Cancer Staging   Staging form: Lung, AJCC 8th Edition - Clinical: Stage IVB (cT3, cN3, pM1c) - Signed by [name], MD on 03/29/2021   03/30/2021 -  Chemotherapy    Patient is on Treatment Plan: LUNG NSCLC PEMETREXED (ALIMTA) /  CARBOPLATIN Q21D X 1 CYCLES          HISTORY OF PRESENTING ILLNESS: Ambulating independently.  With his mother.  [name] 40 y.o.  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    Patient continues to have mild rash on his bilateral lower extremities.  Mild swelling in the legs.  No worsening shortness of breath or cough.  No nausea no vomiting.  Review of Systems  Constitutional:  Positive for malaise/fatigue and weight loss. Negative for chills, diaphoresis and fever.  HENT:  Negative for nosebleeds and sore throat.   Eyes:  Negative for double vision.  Respiratory:  Positive for shortness of breath. Negative for wheezing.   Cardiovascular:  Negative for chest pain, palpitations, orthopnea and leg swelling.  Gastrointestinal:  Negative for abdominal pain, blood in stool, diarrhea, heartburn, melena, nausea and vomiting.  Genitourinary:  Negative for dysuria, frequency and urgency.  Musculoskeletal:  Negative for back pain and joint pain.  Skin:  Positive for rash. Negative for itching.  Neurological:  Negative for dizziness, tingling, focal weakness, weakness and headaches.  Endo/Heme/Allergies:  Does not bruise/bleed easily.  Psychiatric/Behavioral:  Negative for depression. The patient is not nervous/anxious and does not have insomnia.     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    SURGICAL HISTORY: Past Surgical History:  Procedure Laterality Date   FRACTURE SURGERY     broke femur when he was 6 years   PORTA CATH INSERTION N/A 03/28/2021   Procedure: PORTA CATH INSERTION;  Surgeon:  Schnier, Dolores Lory, MD;  Location: [facility];  Service: Cardiovascular;  Laterality: N/A;   VIDEO BRONCHOSCOPY WITH ENDOBRONCHIAL NAVIGATION N/A 03/15/2021    Procedure: VIDEO BRONCHOSCOPY WITH ENDOBRONCHIAL NAVIGATION;  Surgeon: Ottie Glazier, MD;  Location: [facility];  Service: Thoracic;  Laterality: N/A;   VIDEO BRONCHOSCOPY WITH ENDOBRONCHIAL ULTRASOUND N/A 03/15/2021   Procedure: VIDEO BRONCHOSCOPY WITH ENDOBRONCHIAL ULTRASOUND;  Surgeon: Ottie Glazier, MD;  Location: [facility];  Service: Thoracic;  Laterality: N/A;    SOCIAL HISTORY: Social History   Socioeconomic History   Marital status: Married    Spouse name: [name]   Number of children: 2   Years of education: Not on file   Highest education level: Not on file  Occupational History   Not on file  Tobacco Use   Smoking status: Never   Smokeless tobacco: Current    Types: Snuff, Chew  Vaping Use   Vaping Use: Never used  Substance and Sexual Activity   Alcohol use: Never   Drug use: Never   Sexual activity: Not on file  Other Topics Concern   Not on file  Social History Narrative   [place]; with wife; 2 daughters[12 and 22]; never smoked; rare alcohol. Work in saw [employer]    Social Determinants of Health   Financial Resource Strain: Not on file  Food Insecurity: Not on file  Transportation Needs: Not on file  Physical Activity: Not on file  Stress: Not on file  Social Connections: Not on file  Intimate Partner Violence: Not on file    FAMILY HISTORY: Family History  Problem Relation Age of Onset   High Cholesterol Mother    Hypertension Father    Cancer Father    Lung cancer Father    Skin cancer Father     ALLERGIES:  is allergic to codeine.  MEDICATIONS:  Current Outpatient Medications  Medication Sig Dispense Refill   apixaban (ELIQUIS) 5 MG TABS tablet Take 1 tablet (68m) twice daily 60 tablet 2   clindamycin (CLINDAGEL) 1 % gel Apply topically 2 (two) times daily. Apply to affected areas twice a day. 30 g 0   dexamethasone (DECADRON) 1 MG tablet Take 1 tablet (1 mg total) by mouth 2 (two) times daily with a meal. 60 tablet 1   folic  acid (FOLVITE) 1 MG tablet Take 1 tablet (1 mg total) by mouth daily. 90 tablet 1   lidocaine-prilocaine (EMLA) cream Apply 1 application topically as needed. 30 g 0   LORazepam (ATIVAN) 0.5 MG tablet Take 1 tablet (0.5 mg total) by mouth every 8 (eight) hours. 30 tablet 0   metoprolol succinate (TOPROL XL) 25 MG 24 hr tablet Take 1 tablet (25 mg total) by mouth daily. (Patient taking differently: Take 50 mg by mouth daily.) 30 tablet 1   osimertinib mesylate (TAGRISSO) 80 MG tablet Take 1 tablet (80 mg total) by mouth daily. 30 tablet 4   sertraline (ZOLOFT) 50 MG tablet Take 0.5 tablets (25 mg total) by mouth daily. 30 tablet 2   chlorpheniramine-HYDROcodone (TUSSIONEX) 10-8 MG/5ML SUER Take 5 mLs by mouth every 12 (twelve) hours as needed for cough. (Patient not taking: No sig reported) 140 mL 0   ondansetron (ZOFRAN) 8 MG tablet One pill every 8 hours as needed for nausea/vomitting. (Patient not taking: No sig reported) 40 tablet 1   prochlorperazine (COMPAZINE) 10 MG tablet Take 1 tablet (10 mg total) by mouth every 6 (six) hours as needed for nausea or vomiting. (  Patient not taking: No sig reported) 40 tablet 1   No current facility-administered medications for this visit.      Marland Kitchen  PHYSICAL EXAMINATION: ECOG PERFORMANCE STATUS: 1 - Symptomatic but completely ambulatory  Vitals:   06/02/21 1052  BP: 129/87  Pulse: 65  Resp: 16  Temp: 98.2 F (36.8 C)  SpO2: 98%   Filed Weights   06/02/21 1052  Weight: 221 lb (100.2 kg)    Physical Exam HENT:     Head: Normocephalic and atraumatic.     Mouth/Throat:     Pharynx: No oropharyngeal exudate.  Eyes:     Pupils: Pupils are equal, round, and reactive to light.  Cardiovascular:     Rate and Rhythm: Normal rate and regular rhythm.  Pulmonary:     Effort: No respiratory distress.     Breath sounds: No wheezing.     Comments: Decreased breath sound on the left side compared to right. Abdominal:     General: Bowel sounds are  normal. There is no distension.     Palpations: Abdomen is soft. There is no mass.     Tenderness: There is no abdominal tenderness. There is no guarding or rebound.  Musculoskeletal:        General: No tenderness. Normal range of motion.     Cervical back: Normal range of motion and neck supple.  Skin:    General: Skin is warm.     Comments: Acne-like rash noted in the forehead.  Petechial rash on the bilateral lower extremities.  Neurological:     Mental Status: He is alert and oriented to person, place, and time.  Psychiatric:        Mood and Affect: Affect normal.     LABORATORY DATA:  I have reviewed the data as listed Lab Results  Component Value Date   WBC 11.5 (H) 06/02/2021   HGB 13.3 06/02/2021   HCT 40.8 06/02/2021   MCV 94.9 06/02/2021   PLT 259 06/02/2021   Recent Labs    05/05/21 0818 05/15/21 1257 05/19/21 0909 06/02/21 1038  NA 137 136 138 137  K 3.9 3.9 3.7 3.3*  CL 106 104 109 106  CO2 '23 25 24 25  ' GLUCOSE 96 90 83 108*  BUN 19 20 24* 9  CREATININE 0.94 0.92 0.91 0.88  CALCIUM 8.3* 8.5* 8.2* 8.0*  GFRNONAA >60 >60 >60 >60  PROT 6.0*  --  6.2* 6.0*  ALBUMIN 3.1*  --  3.2* 3.0*  AST 21  --  25 23  ALT 47*  --  46* 41  ALKPHOS 139*  --  91 86  BILITOT 0.4  --  0.2* 0.4    RADIOGRAPHIC STUDIES: I have personally reviewed the radiological images as listed and agreed with the findings in the report. DG Chest 2 View  Result Date: 05/19/2021 CLINICAL DATA:  Left pleural effusion EXAM: CHEST - 2 VIEW COMPARISON:  05/11/2021 FINDINGS: Stable to decreased left pleural effusion with obscuration and opacification of the low volume left lung. Clear right chest. Non obscured heart size and mediastinal contours are unchanged. Unremarkable porta catheter on the right. IMPRESSION: Left chest opacification including pleural effusion that is stable or mildly improved when compared to post thoracentesis radiograph of 05/03/2021. Electronically Signed   By: Monte Fantasia M.D.   On: 05/19/2021 11:44   MR Brain W Wo Contrast  Result Date: 05/30/2021 CLINICAL DATA:  Lung cancer staging. EXAM: MRI HEAD WITHOUT AND WITH CONTRAST TECHNIQUE: Multiplanar, multiecho  pulse sequences of the brain and surrounding structures were obtained without and with intravenous contrast. CONTRAST:  63m GADAVIST GADOBUTROL 1 MMOL/ML IV SOLN COMPARISON:  03/28/2021 FINDINGS: Brain: Interval significant decrease or resolution of previously noted contrast enhancing lesions throughout brain, with resolution of associated T2 hyperintense signal. Remaining lesions include a 3 mm right paramedian frontal lesion (series 18, image 147), previously 11 mm; a left lateral posterior frontal lobe lesion measuring up to 2 mm (series 18, image 127), previously 6 mm; a right posterior parietal lesion measuring up to 5 mm (series 18, image 126), previously 9 mm; the left frontal lobe lesion measuring up to 4 mm (series 18, image 124), previously 9 mm; a 5 mm right inferior parietal lesion (series 18, image 22), previously 10 mm. Previously noted cerebellar lesions are no longer seen. No new lesions. No acute infarct, hemorrhage, hydrocephalus, or extra-axial collection. No mass effect or midline shift. Vascular: Normal flow voids. Skull and upper cervical spine: Normal marrow signal. Sinuses/Orbits: Mucous retention cyst in left maxillary sinus. Otherwise unremarkable. Normal orbits. Other: None. IMPRESSION: Interval resolution or significant decrease in the size of previously noted contrast-enhancing lesions in the brain, likely reflecting treatment response. No new metastatic lesions. Electronically Signed   By: AMerilyn BabaM.D.   On: 05/30/2021 14:07    ASSESSMENT & PLAN:   Cancer of [place] lobe of left lung ([place] # STAGE IV- Left upper lobe lung cancer-adenocarcinoma the lung- PET June 2022- Left lung collapse with 7 cm hypermetabolic masslike opacity centered in the left lower lobe and involving the left  hilum; Large malignant left pleural effusion;  Hypermetabolic lymphadenopathy in mediastinum and left supraclavicular region. Peritoneal/omental carcinomatosis; Diffuse bone metastases.  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  EGFR- 19 del POSITIVE.  #Currently on osimertinib [since June 28th] ~2 months; will order imaging in 1 month/next visit.  #Skin rash acne-G-. Likely osimertinib-improved with steroid ointment.  Petechial rash bilateral lower extremities unclear etiology-platelets normal.  Question steroids-see below.  #Multiple brain mets subcentimeter asymptomatic [NO RT]- . MRI brain AUG 16th-significant response noted. Taper Dexamethasone 1 mg once a day for 1 week and then 0.5 mg once a day for 1 week and stop.    #Left-sided pleural effusion recurrent-I would await for osimertinib to work; hold off pleurodesis at this time.  Await above imaging.  # ?Symptomatic DVT of the right calf-on Eliquis pain improved.  Continue monitoring.  #Bone metastases-recommend Zometa;-however hold Zometa because of low calcium 8.3; ?  Osimertinib.  Continue calcium vitamin D intake...  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 STABLE.  #Again reviewed the prognosis-unfortunately incurable; 3-4 years based upon response/further treatment options progression of disease.  # DISPOSITION: # HOLD zometa; de-access # follow up in 4 week-MD; labs- cbc/cmp; possible zometa; PET scan--Dr.B     All questions were answered. The patient knows to call the clinic with any problems, questions or concerns.    GCammie Sickle MD 06/11/2021 9:12 PM

## 2021-06-15 ENCOUNTER — Encounter: Payer: Self-pay | Admitting: Nurse Practitioner

## 2021-06-15 ENCOUNTER — Encounter: Payer: Self-pay | Admitting: *Deleted

## 2021-06-15 ENCOUNTER — Encounter: Payer: Self-pay | Admitting: Internal Medicine

## 2021-06-15 NOTE — Telephone Encounter (Signed)
Left msg with dentist office to have Dr. Gershon Mussel call Dr. B directly.

## 2021-06-15 NOTE — Telephone Encounter (Signed)
On 09/01-spoke to Dr. Gershon Mussel dentistry regarding bisphosphonate clearance for this patient.   Okay to be evaluated week of 12th-given that office schedules.  Heather/taylor-please inform patient of above.   Follow-up with me as planned.

## 2021-06-26 ENCOUNTER — Other Ambulatory Visit: Payer: Self-pay | Admitting: Internal Medicine

## 2021-06-26 ENCOUNTER — Telehealth: Payer: Self-pay | Admitting: *Deleted

## 2021-06-26 DIAGNOSIS — C3432 Malignant neoplasm of lower lobe, left bronchus or lung: Secondary | ICD-10-CM

## 2021-06-26 NOTE — Telephone Encounter (Signed)
Called patient. Discussed that pet scan was not approved by his insurance. He stated that He was prepared to hear this as Dr. B had told him that it was a possibility.  Per Dr. Jacinto Reap- MD will order stat CT scan. Pt agreeable and would appreciate a phone call re: the ct apt times.

## 2021-06-27 ENCOUNTER — Ambulatory Visit
Admission: RE | Admit: 2021-06-27 | Discharge: 2021-06-27 | Disposition: A | Discharge status: Home / Self Care | Payer: Managed Care, Other (non HMO) | Source: Ambulatory Visit | Attending: Internal Medicine | Admitting: Internal Medicine

## 2021-06-27 ENCOUNTER — Other Ambulatory Visit: Payer: Self-pay

## 2021-06-27 DIAGNOSIS — C3432 Malignant neoplasm of lower lobe, left bronchus or lung: Secondary | ICD-10-CM | POA: Insufficient documentation

## 2021-06-27 IMAGING — CT CT CHEST-ABD-PELV W/ CM
2 of 5 series · 11 of 36 positions shown, 12 images · IV contrast (omnipaque)
Comparison: PET-CT dated [DATE]; CT chest dated [DATE]
COMPARISON: PET-CT dated [DATE]; CT chest dated [DATE]

Addendum:
CLINICAL DATA: Non-small-cell lung cancer

EXAM:
CT CHEST, ABDOMEN, AND PELVIS WITH CONTRAST
TECHNIQUE: Multidetector CT imaging of the chest, abdomen and pelvis was
performed following the standard protocol during bolus
administration of intravenous contrast.
CONTRAST:  100mL OMNIPAQUE IOHEXOL 350 MG/ML SOLN

[Series 2: cap with · axial · 0.83mm/px · z∈[-676,-132]mm · 8 of 137 slices shown, 9 images]
[im 14/137  mediastinal]
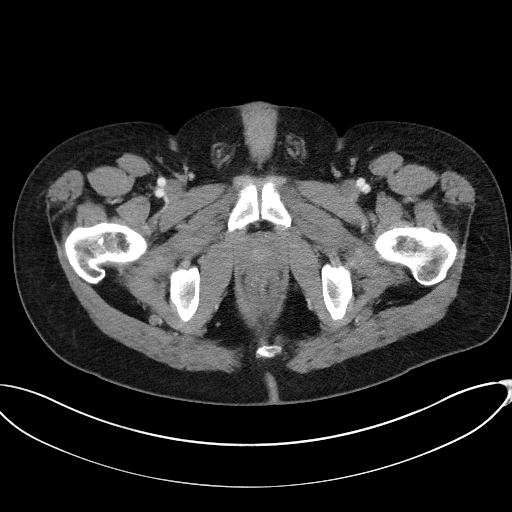
[im 14/137  bone]
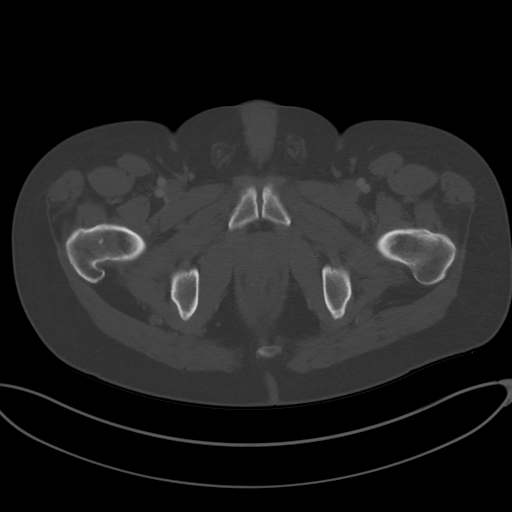
[im 28/137  mediastinal]
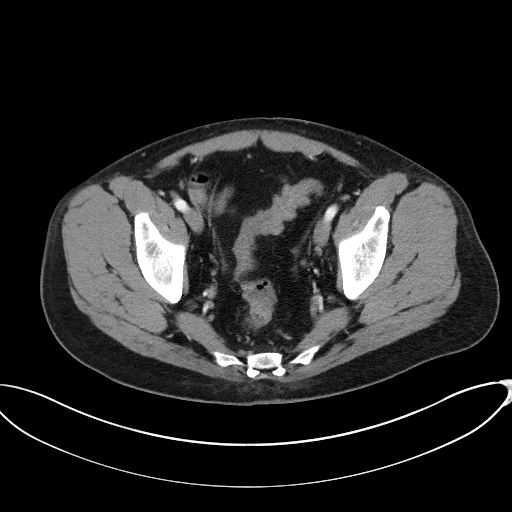
[im 41/137  mediastinal]
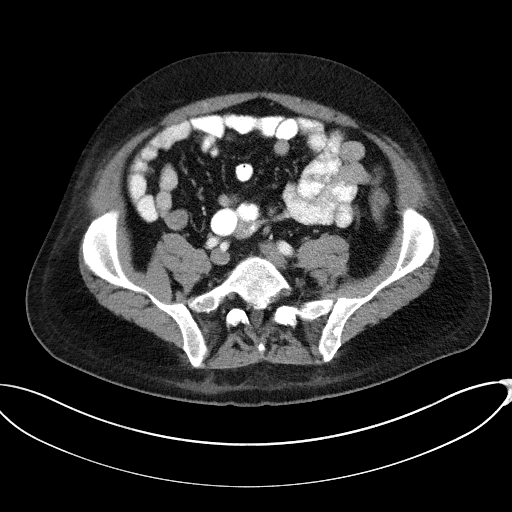
[im 55/137  mediastinal]
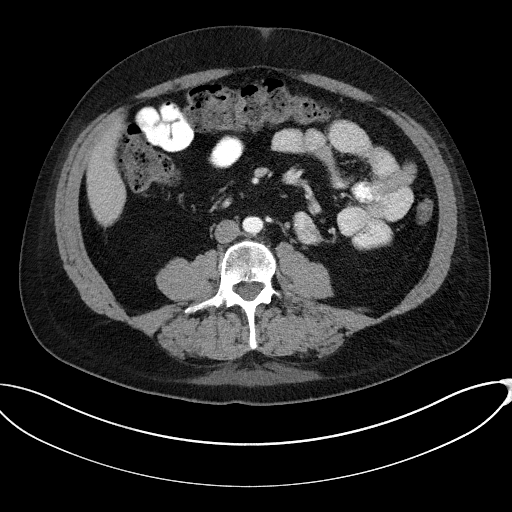
[im 82/137  mediastinal]
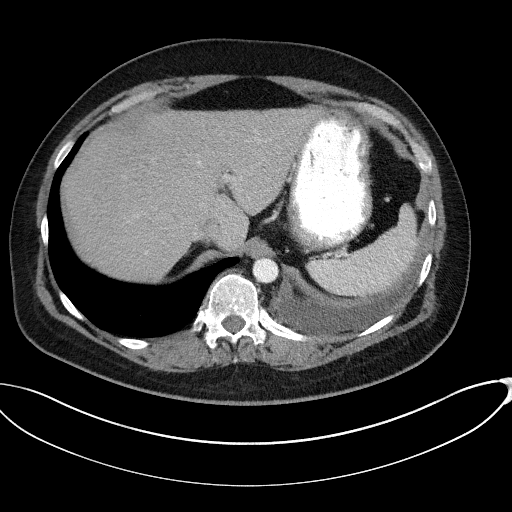
[im 96/137  mediastinal]
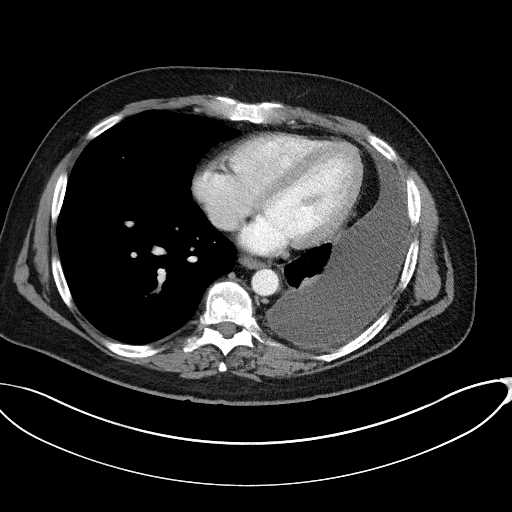
[im 109/137  mediastinal]
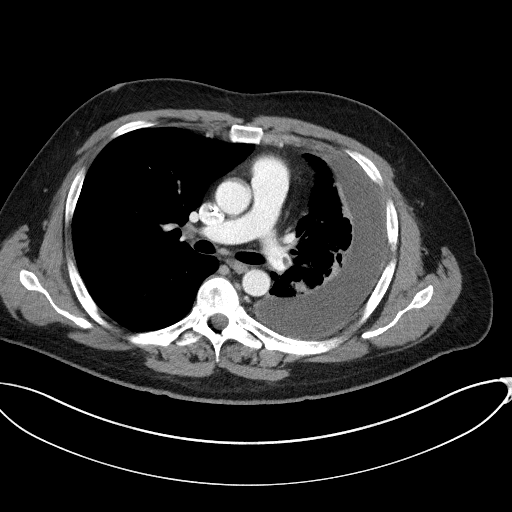
[im 123/137  mediastinal]
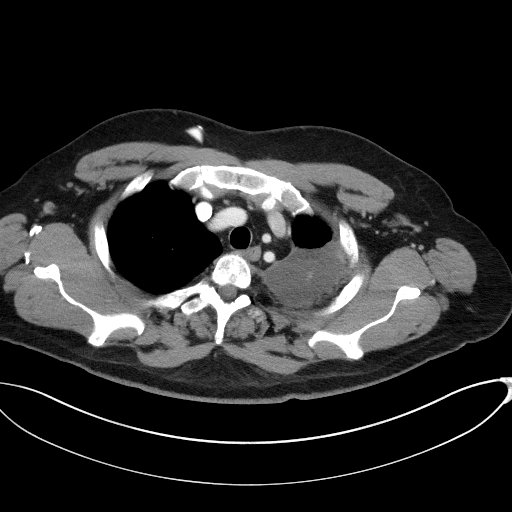

[Series 5: coronals · coronal · 0.94mm/px · 3 of 162 slices shown]
[im 33/162  mediastinal]
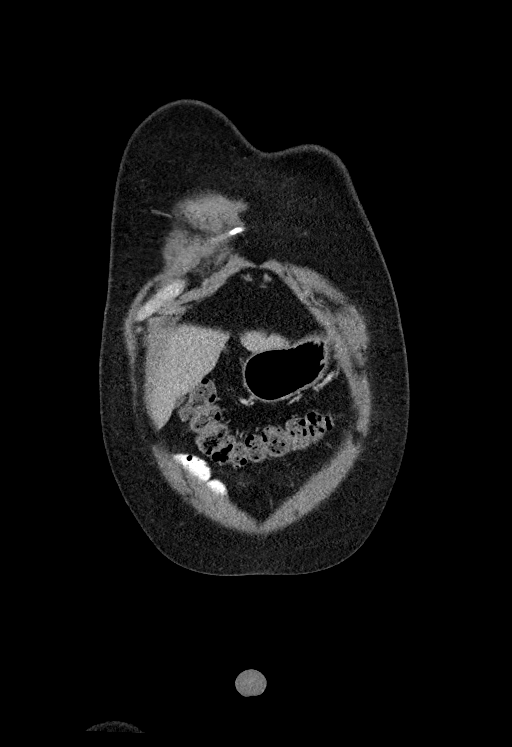
[im 65/162  mediastinal]
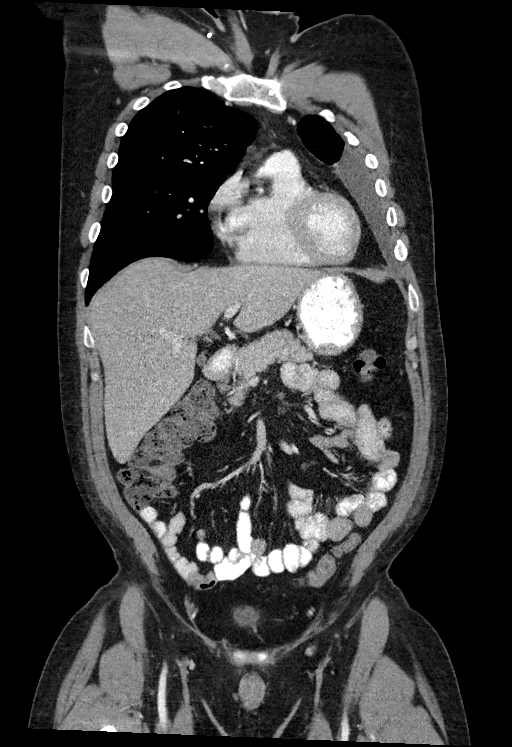
[im 97/162  mediastinal]
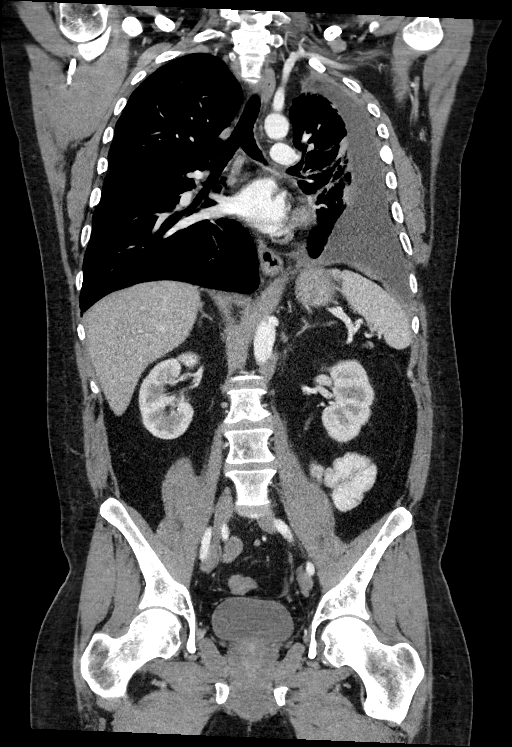

[11 of 36 positions shown; findings below may reference images not displayed]

FINDINGS: CT CHEST FINDINGS

Cardiovascular: Normal heart size. No pericardial effusion. No
significant coronary artery calcifications. No atherosclerotic
disease of the thoracic aorta. Pulmonary embolus of the distal left
pulmonary artery extending into the left lower lobe lobar artery.
Right sided port with tip in the right atrium.

Mediastinum/Nodes: Esophagus is unremarkable. Decreased size of
mediastinal lymph nodes. Reference right upper paratracheal lymph
node measures 5 mm in short axis on series 2, image 23, previously
1.1 cm. Left supraclavicular lymph nodes are no longer visualized.

Lungs/Pleura: Moderate left pleural effusion, decreased in size
compared to prior PET/CT. Decreased size of masslike opacity in the
left lower lobe which measures approximately 2.2 x 1.9 cm,
previously 7.0 x 5.8 cm. Adjacent linear opacities likely represent
posttreatment change. Interval resolution of upper lobe predominant
micro nodules. Larger solid nodule of the right middle lobe is also
no longer apparent. New peribronchovascular and perifissural
irregular ground-glass opacities are seen predominantly in the left
upper lobe.

Musculoskeletal: Increased of sclerotic lesions seen in the thoracic
spine, ribs and bilateral scapula.

CT ABDOMEN PELVIS FINDINGS

Hepatobiliary: No focal liver abnormality is seen. No gallstones,
gallbladder wall thickening, or biliary dilatation.

Pancreas: Unremarkable. No pancreatic ductal dilatation or
surrounding inflammatory changes.

Spleen: Normal in size without focal abnormality. Soft tissue
density at the splenic hilum is no longer present.

Adrenals/Urinary Tract: Adrenal glands are unremarkable. Kidneys are
normal, without renal calculi, focal lesion, or hydronephrosis.
Bladder is unremarkable.

Stomach/Bowel: Stomach is within normal limits. Appendix appears
normal. No evidence of bowel wall thickening, distention, or
inflammatory changes.

Vascular/Lymphatic: No significant vascular findings are present. No
enlarged abdominal or pelvic lymph nodes.

Reproductive: Prostatomegaly, measuring up to 1.6 cm.

Other: Interval resolution of soft tissue nodularity of the omentum.

Musculoskeletal: Increased number of sclerotic lesions seen in the
thoracolumbar spine.
IMPRESSION: Pulmonary embolus of the distal left pulmonary artery extending into
the left lower lobe lobar pulmonary artery.

Decreased size of left lower lobe masslike opacity and mediastinal
left supraclavicular lymph nodes, and resolution of
omental/peritoneal nodularity.

New peribronchovascular predominant irregular ground-glass opacities
seen in the left upper lobe, likely infectious or inflammatory,
including drug toxicity.

Interval resolution of upper lobe predominant micro nodules which
were possibly metastatic versus infectious or inflammatory in
etiology.

Moderate left pleural effusion, decreased in size compared to prior
exam.

Increased number of sclerotic bone metastases when compared with
prior exam, likely due to treatment response.

ADDENDUM:
Critical Value/emergent results were called by telephone at the time
of interpretation on [DATE] at [DATE] to provider LEANG
LEANG , who verbally acknowledged these results.

*** End of Addendum ***
FINDINGS: CT CHEST FINDINGS

Cardiovascular: Normal heart size. No pericardial effusion. No
significant coronary artery calcifications. No atherosclerotic
disease of the thoracic aorta. Pulmonary embolus of the distal left
pulmonary artery extending into the left lower lobe lobar artery.
Right sided port with tip in the right atrium.

Mediastinum/Nodes: Esophagus is unremarkable. Decreased size of
mediastinal lymph nodes. Reference right upper paratracheal lymph
node measures 5 mm in short axis on series 2, image 23, previously
1.1 cm. Left supraclavicular lymph nodes are no longer visualized.

Lungs/Pleura: Moderate left pleural effusion, decreased in size
compared to prior PET/CT. Decreased size of masslike opacity in the
left lower lobe which measures approximately 2.2 x 1.9 cm,
previously 7.0 x 5.8 cm. Adjacent linear opacities likely represent
posttreatment change. Interval resolution of upper lobe predominant
micro nodules. Larger solid nodule of the right middle lobe is also
no longer apparent. New peribronchovascular and perifissural
irregular ground-glass opacities are seen predominantly in the left
upper lobe.

Musculoskeletal: Increased of sclerotic lesions seen in the thoracic
spine, ribs and bilateral scapula.

CT ABDOMEN PELVIS FINDINGS

Hepatobiliary: No focal liver abnormality is seen. No gallstones,
gallbladder wall thickening, or biliary dilatation.

Pancreas: Unremarkable. No pancreatic ductal dilatation or
surrounding inflammatory changes.

Spleen: Normal in size without focal abnormality. Soft tissue
density at the splenic hilum is no longer present.

Adrenals/Urinary Tract: Adrenal glands are unremarkable. Kidneys are
normal, without renal calculi, focal lesion, or hydronephrosis.
Bladder is unremarkable.

Stomach/Bowel: Stomach is within normal limits. Appendix appears
normal. No evidence of bowel wall thickening, distention, or
inflammatory changes.

Vascular/Lymphatic: No significant vascular findings are present. No
enlarged abdominal or pelvic lymph nodes.

Reproductive: Prostatomegaly, measuring up to 1.6 cm.

Other: Interval resolution of soft tissue nodularity of the omentum.

Musculoskeletal: Increased number of sclerotic lesions seen in the
thoracolumbar spine.
IMPRESSION: Pulmonary embolus of the distal left pulmonary artery extending into
the left lower lobe lobar pulmonary artery.

Decreased size of left lower lobe masslike opacity and mediastinal
left supraclavicular lymph nodes, and resolution of
omental/peritoneal nodularity.

New peribronchovascular predominant irregular ground-glass opacities
seen in the left upper lobe, likely infectious or inflammatory,
including drug toxicity.

Interval resolution of upper lobe predominant micro nodules which
were possibly metastatic versus infectious or inflammatory in
etiology.

Moderate left pleural effusion, decreased in size compared to prior
exam.

Increased number of sclerotic bone metastases when compared with
prior exam, likely due to treatment response.

## 2021-06-27 MED ORDER — IOHEXOL 350 MG/ML SOLN
100.0000 mL | Freq: Once | INTRAVENOUS | Status: AC | PRN
  Administered 2021-06-27: 100 mL via INTRAVENOUS

## 2021-06-28 ENCOUNTER — Ambulatory Visit: Payer: Managed Care, Other (non HMO)

## 2021-06-28 ENCOUNTER — Encounter: Payer: Self-pay | Admitting: Internal Medicine

## 2021-06-28 NOTE — Progress Notes (Signed)
I left a voicemail for the patient discussing the improved response noted on the repeat imaging. However Incidentally noted to have left lung blood Blood clot. however patient is already on blood thinners. Follow up as planned on this Friday if no symptoms noted. Recommend call is back if any questions.

## 2021-06-30 ENCOUNTER — Telehealth: Payer: Self-pay

## 2021-06-30 ENCOUNTER — Inpatient Hospital Stay: Payer: Managed Care, Other (non HMO)

## 2021-06-30 ENCOUNTER — Inpatient Hospital Stay: Payer: Managed Care, Other (non HMO) | Attending: Internal Medicine

## 2021-06-30 ENCOUNTER — Encounter: Payer: Self-pay | Admitting: Internal Medicine

## 2021-06-30 ENCOUNTER — Inpatient Hospital Stay (HOSPITAL_BASED_OUTPATIENT_CLINIC_OR_DEPARTMENT_OTHER): Payer: Managed Care, Other (non HMO) | Admitting: Internal Medicine

## 2021-06-30 DIAGNOSIS — C3432 Malignant neoplasm of lower lobe, left bronchus or lung: Secondary | ICD-10-CM

## 2021-06-30 DIAGNOSIS — Z79899 Other long term (current) drug therapy: Secondary | ICD-10-CM | POA: Diagnosis not present

## 2021-06-30 DIAGNOSIS — Z9221 Personal history of antineoplastic chemotherapy: Secondary | ICD-10-CM | POA: Diagnosis not present

## 2021-06-30 DIAGNOSIS — I499 Cardiac arrhythmia, unspecified: Secondary | ICD-10-CM | POA: Diagnosis not present

## 2021-06-30 DIAGNOSIS — C7951 Secondary malignant neoplasm of bone: Secondary | ICD-10-CM | POA: Diagnosis present

## 2021-06-30 DIAGNOSIS — C3492 Malignant neoplasm of unspecified part of left bronchus or lung: Secondary | ICD-10-CM

## 2021-06-30 DIAGNOSIS — E538 Deficiency of other specified B group vitamins: Secondary | ICD-10-CM | POA: Insufficient documentation

## 2021-06-30 DIAGNOSIS — C7931 Secondary malignant neoplasm of brain: Secondary | ICD-10-CM | POA: Diagnosis present

## 2021-06-30 DIAGNOSIS — Z7901 Long term (current) use of anticoagulants: Secondary | ICD-10-CM | POA: Insufficient documentation

## 2021-06-30 LAB — CBC WITH DIFFERENTIAL/PLATELET
Abs Immature Granulocytes: 0.35 10*3/uL — ABNORMAL HIGH (ref 0.00–0.07)
Basophils Absolute: 0.1 10*3/uL (ref 0.0–0.1)
Basophils Relative: 1 %
Eosinophils Absolute: 0.1 10*3/uL (ref 0.0–0.5)
Eosinophils Relative: 0 %
HCT: 44 % (ref 39.0–52.0)
Hemoglobin: 14.2 g/dL (ref 13.0–17.0)
Immature Granulocytes: 3 %
Lymphocytes Relative: 14 %
Lymphs Abs: 1.9 10*3/uL (ref 0.7–4.0)
MCH: 31.3 pg (ref 26.0–34.0)
MCHC: 32.3 g/dL (ref 30.0–36.0)
MCV: 96.9 fL (ref 80.0–100.0)
Monocytes Absolute: 0.8 10*3/uL (ref 0.1–1.0)
Monocytes Relative: 6 %
Neutro Abs: 10.5 10*3/uL — ABNORMAL HIGH (ref 1.7–7.7)
Neutrophils Relative %: 76 %
Platelets: 213 10*3/uL (ref 150–400)
RBC: 4.54 MIL/uL (ref 4.22–5.81)
RDW: 17.9 % — ABNORMAL HIGH (ref 11.5–15.5)
WBC: 13.6 10*3/uL — ABNORMAL HIGH (ref 4.0–10.5)
nRBC: 0 % (ref 0.0–0.2)

## 2021-06-30 LAB — COMPREHENSIVE METABOLIC PANEL
ALT: 34 U/L (ref 0–44)
AST: 25 U/L (ref 15–41)
Albumin: 3.5 g/dL (ref 3.5–5.0)
Alkaline Phosphatase: 79 U/L (ref 38–126)
Anion gap: 8 (ref 5–15)
BUN: 14 mg/dL (ref 6–20)
CO2: 25 mmol/L (ref 22–32)
Calcium: 8.4 mg/dL — ABNORMAL LOW (ref 8.9–10.3)
Chloride: 102 mmol/L (ref 98–111)
Creatinine, Ser: 0.98 mg/dL (ref 0.61–1.24)
GFR, Estimated: >60 mL/min (ref >60)
Glucose, Bld: 129 mg/dL — ABNORMAL HIGH (ref 70–99)
Potassium: 3.5 mmol/L (ref 3.5–5.1)
Sodium: 135 mmol/L (ref 135–145)
Total Bilirubin: 0.6 mg/dL (ref 0.3–1.2)
Total Protein: 6.9 g/dL (ref 6.5–8.1)

## 2021-06-30 MED ORDER — HEPARIN SOD (PORK) LOCK FLUSH 100 UNIT/ML IV SOLN
INTRAVENOUS | Status: AC
  Administered 2021-06-30: 500 [IU]
  Filled 2021-06-30: qty 5

## 2021-06-30 NOTE — Telephone Encounter (Signed)
Nutrition  Patient wanting to speak with RD regarding ways to increase calcium level as can't get it to 8.9 with diet and current supplement use.    Called and spoke with patient via phone.    Each morning eating yogurt with 250mg  calcium 9am has biscuit and gravy 11am takes 2 citracal tablets of 400mg  calcium (calcium citrate) 7pm takes 2 tricalcium phosphate tab with 500-600mg  calcium Evening has either ice cream of almond milk/oat milk with 450 mg calcium  RD to research calcium supplements and follow back up with patient for further recommendations. Discussed could add glass milk with biscuit and gravy.   Phaedra Colgate B. Zenia Resides, [place], [place] Registered Dietitian [phone] (mobile)

## 2021-06-30 NOTE — Progress Notes (Signed)
[facility] CONSULT NOTE  Patient Care Team: Pcp, No as PCP - General [name], RN as Oncology Nurse Navigator  CHIEF COMPLAINTS/PURPOSE OF CONSULTATION: Lung cancer  #  Oncology History Overview Note  IMPRESSION: 1. Patchy nodular fat stranding throughout the anterior left upper quadrant peritoneal fat, nonspecific, cannot exclude peritoneal carcinomatosis. Dedicated CT abdomen/pelvis with oral and IV contrast recommended for further evaluation. 2. Dense patchy consolidation replacing much of the left lower lung lobe, appearing masslike in the superior segment left lower lobe, with associated bulging of the left major fissure and associated left lower lobe volume loss. Fine nodularity throughout both lungs with an upper lobe predominance. Asymmetric left upper lobe interlobular septal thickening. These findings are indeterminate, with differential including multilobar pneumonia, sarcoidosis or a neoplastic process. The persistence on radiographs back to 01/20/2021 despite antibiotic therapy make sarcoidosis or a neoplastic process more likely. Pulmonology consultation suggested for consideration of bronchoscopic evaluation. 3. Small dependent left pleural effusion. 4. Mild mediastinal lymphadenopathy, nonspecific. 5. Subacute healing lateral right sixth rib fracture.   DIAGNOSIS:  A. LUNG, LEFT LOWER LOBE; ENB-ASSISTED BIOPSY:  - NON-SMALL CELL CARCINOMA, FAVOR ADENOCARCINOMA.  - FOREIGN MATERIAL SUGGESTIVE OF ASPIRATION.   # EGFR MUTATED: 48 del- June 28th, 2022- [name].    Cancer of lower lobe of left lung ([place])  03/23/2021 Initial Diagnosis   Cancer of lower lobe of left lung ([place])   03/29/2021 Cancer Staging   Staging form: Lung, AJCC 8th Edition - Clinical: Stage IVB (cT3, cN3, pM1c) - Signed by [name], MD on 03/29/2021   03/30/2021 -  Chemotherapy    Patient is on Treatment Plan: LUNG NSCLC PEMETREXED (ALIMTA) /  CARBOPLATIN Q21D X 1 CYCLES          HISTORY OF PRESENTING ILLNESS: Ambulating independently.  With his wife  [name] 40 y.o.  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  Patient denies any nausea vomiting.  Denies any headaches.  Appetite is good.  No worsening shortness of breath or chest pain.   Review of Systems  Constitutional:  Positive for malaise/fatigue and weight loss. Negative for chills, diaphoresis and fever.  HENT:  Negative for nosebleeds and sore throat.   Eyes:  Negative for double vision.  Respiratory:  Positive for shortness of breath. Negative for wheezing.   Cardiovascular:  Negative for chest pain, palpitations, orthopnea and leg swelling.  Gastrointestinal:  Negative for abdominal pain, blood in stool, diarrhea, heartburn, melena, nausea and vomiting.  Genitourinary:  Negative for dysuria, frequency and urgency.  Musculoskeletal:  Negative for back pain and joint pain.  Skin:  Positive for rash. Negative for itching.  Neurological:  Negative for dizziness, tingling, focal weakness, weakness and headaches.  Endo/Heme/Allergies:  Does not bruise/bleed easily.  Psychiatric/Behavioral:  Negative for depression. The patient is not nervous/anxious and does not have insomnia.     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    SURGICAL HISTORY: Past Surgical History:  Procedure Laterality Date   FRACTURE SURGERY     broke femur when he was 6 years   PORTA CATH INSERTION N/A 03/28/2021   Procedure: PORTA CATH INSERTION;  Surgeon: Katha Cabal, MD;  Location: [facility];  Service: Cardiovascular;  Laterality: N/A;   VIDEO BRONCHOSCOPY WITH ENDOBRONCHIAL NAVIGATION N/A 03/15/2021   Procedure:  VIDEO BRONCHOSCOPY WITH ENDOBRONCHIAL NAVIGATION;  Surgeon: Ottie Glazier, MD;  Location: [facility];  Service: Thoracic;  Laterality: N/A;   VIDEO BRONCHOSCOPY WITH ENDOBRONCHIAL ULTRASOUND N/A 03/15/2021   Procedure: VIDEO BRONCHOSCOPY WITH ENDOBRONCHIAL ULTRASOUND;  Surgeon: Ottie Glazier, MD;  Location: [facility];  Service: Thoracic;  Laterality: N/A;    SOCIAL HISTORY: Social History   Socioeconomic History   Marital status: Married    Spouse name: [name]   Number of children: 2   Years of education: Not on file   Highest education level: Not on file  Occupational History   Not on file  Tobacco Use   Smoking status: Never   Smokeless tobacco: Current    Types: Snuff, Chew  Vaping Use   Vaping Use: Never used  Substance and Sexual Activity   Alcohol use: Never   Drug use: Never   Sexual activity: Not on file  Other Topics Concern   Not on file  Social History Narrative   [place]; with wife; 2 daughters[12 and 22]; never smoked; rare alcohol. Work in saw [employer]    Social Determinants of Health   Financial Resource Strain: Not on file  Food Insecurity: Not on file  Transportation Needs: Not on file  Physical Activity: Not on file  Stress: Not on file  Social Connections: Not on file  Intimate Partner Violence: Not on file    FAMILY HISTORY: Family History  Problem Relation Age of Onset   High Cholesterol Mother    Hypertension Father    Cancer Father    Lung cancer Father    Skin cancer Father     ALLERGIES:  is allergic to codeine.  MEDICATIONS:  Current Outpatient Medications  Medication Sig Dispense Refill   apixaban (ELIQUIS) 5 MG TABS tablet Take 1 tablet (57m) twice daily 60 tablet 2   clindamycin (CLINDAGEL) 1 % gel Apply topically 2 (two) times daily. Apply to affected areas twice a day. 30 g 0   dexamethasone (DECADRON) 1 MG tablet Take 1 tablet (1 mg total) by mouth 2 (two) times daily with a meal. 60 tablet 1   folic acid (FOLVITE) 1  MG tablet Take 1 tablet (1 mg total) by mouth daily. 90 tablet 1   lidocaine-prilocaine (EMLA) cream Apply 1 application topically as needed. 30 g 0   LORazepam (ATIVAN) 0.5 MG tablet Take 1 tablet (0.5 mg total) by mouth every 8 (eight) hours. 30 tablet 0   metoprolol succinate (TOPROL XL) 25 MG 24 hr tablet Take 1 tablet (25 mg total) by mouth daily. (Patient taking differently: Take 50 mg by mouth daily.) 30 tablet 1   osimertinib mesylate (TAGRISSO) 80 MG tablet Take 1 tablet (80 mg total) by mouth daily. 30 tablet 4   sertraline (ZOLOFT) 50 MG tablet Take 0.5 tablets (25 mg total) by mouth daily. 30 tablet 2   chlorpheniramine-HYDROcodone (TUSSIONEX) 10-8 MG/5ML SUER Take 5 mLs by mouth every 12 (twelve) hours as needed for cough. (Patient not taking: No sig reported) 140 mL 0   ondansetron (ZOFRAN) 8 MG tablet One pill every 8 hours as needed for nausea/vomitting. (Patient not taking: No sig reported) 40 tablet 1   prochlorperazine (COMPAZINE) 10 MG tablet Take 1 tablet (10 mg total) by mouth every 6 (six) hours as needed for nausea or vomiting. (Patient not taking: No sig  reported) 40 tablet 1   No current facility-administered medications for this visit.      Marland Kitchen  PHYSICAL EXAMINATION: ECOG PERFORMANCE STATUS: 1 - Symptomatic but completely ambulatory  Vitals:   06/30/21 1105  BP: 126/86  Pulse: 66  Resp: 20  Temp: (!) 96.2 F (35.7 C)  SpO2: 100%   Filed Weights   06/30/21 1105  Weight: 231 lb (104.8 kg)    Physical Exam HENT:     Head: Normocephalic and atraumatic.     Mouth/Throat:     Pharynx: No oropharyngeal exudate.  Eyes:     Pupils: Pupils are equal, round, and reactive to light.  Cardiovascular:     Rate and Rhythm: Normal rate and regular rhythm.  Pulmonary:     Effort: No respiratory distress.     Breath sounds: No wheezing.     Comments: Decreased breath sound on the left side compared to right. Abdominal:     General: Bowel sounds are normal. There  is no distension.     Palpations: Abdomen is soft. There is no mass.     Tenderness: There is no abdominal tenderness. There is no guarding or rebound.  Musculoskeletal:        General: No tenderness. Normal range of motion.     Cervical back: Normal range of motion and neck supple.  Skin:    General: Skin is warm.     Comments: Acne-like rash noted in the forehead.  Petechial rash on the bilateral lower extremities.  Neurological:     Mental Status: He is alert and oriented to person, place, and time.  Psychiatric:        Mood and Affect: Affect normal.     LABORATORY DATA:  I have reviewed the data as listed Lab Results  Component Value Date   WBC 13.6 (H) 06/30/2021   HGB 14.2 06/30/2021   HCT 44.0 06/30/2021   MCV 96.9 06/30/2021   PLT 213 06/30/2021   Recent Labs    05/19/21 0909 06/02/21 1038 06/30/21 1044  NA 138 137 135  K 3.7 3.3* 3.5  CL 109 106 102  CO2 '24 25 25  ' GLUCOSE 83 108* 129*  BUN 24* 9 14  CREATININE 0.91 0.88 0.98  CALCIUM 8.2* 8.0* 8.4*  GFRNONAA >60 >60 >60  PROT 6.2* 6.0* 6.9  ALBUMIN 3.2* 3.0* 3.5  AST '25 23 25  ' ALT 46* 41 34  ALKPHOS 91 86 79  BILITOT 0.2* 0.4 0.6    RADIOGRAPHIC STUDIES: I have personally reviewed the radiological images as listed and agreed with the findings in the report. CT CHEST ABDOMEN PELVIS W CONTRAST  Addendum Date: 06/27/2021   ADDENDUM REPORT: 06/27/2021 13:14 ADDENDUM: Critical Value/emergent results were called by telephone at the time of interpretation on 06/27/2021 at 1:11 pm to provider [facility] , who verbally acknowledged these results. Electronically Signed   By: Yetta Glassman M.D.   On: 06/27/2021 13:14   Result Date: 06/27/2021 CLINICAL DATA:  Non-small-cell lung cancer EXAM: CT CHEST, ABDOMEN, AND PELVIS WITH CONTRAST TECHNIQUE: Multidetector CT imaging of the chest, abdomen and pelvis was performed following the standard protocol during bolus administration of intravenous contrast.  CONTRAST:  156m OMNIPAQUE IOHEXOL 350 MG/ML SOLN COMPARISON:  PET-CT dated March 27, 2021; CT chest dated March 15, 2021 FINDINGS: CT CHEST FINDINGS Cardiovascular: Normal heart size. No pericardial effusion. No significant coronary artery calcifications. No atherosclerotic disease of the thoracic aorta. Pulmonary embolus of the distal left pulmonary artery extending  into the left lower lobe lobar artery. Right sided port with tip in the right atrium. Mediastinum/Nodes: Esophagus is unremarkable. Decreased size of mediastinal lymph nodes. Reference right upper paratracheal lymph node measures 5 mm in short axis on series 2, image 23, previously 1.1 cm. Left supraclavicular lymph nodes are no longer visualized. Lungs/Pleura: Moderate left pleural effusion, decreased in size compared to prior PET/CT. Decreased size of masslike opacity in the left lower lobe which measures approximately 2.2 x 1.9 cm, previously 7.0 x 5.8 cm. Adjacent linear opacities likely represent posttreatment change. Interval resolution of upper lobe predominant micro nodules. Larger solid nodule of the right middle lobe is also no longer apparent. New peribronchovascular and perifissural irregular ground-glass opacities are seen predominantly in the left upper lobe. Musculoskeletal: Increased of sclerotic lesions seen in the thoracic spine, ribs and bilateral scapula. CT ABDOMEN PELVIS FINDINGS Hepatobiliary: No focal liver abnormality is seen. No gallstones, gallbladder wall thickening, or biliary dilatation. Pancreas: Unremarkable. No pancreatic ductal dilatation or surrounding inflammatory changes. Spleen: Normal in size without focal abnormality. Soft tissue density at the splenic hilum is no longer present. Adrenals/Urinary Tract: Adrenal glands are unremarkable. Kidneys are normal, without renal calculi, focal lesion, or hydronephrosis. Bladder is unremarkable. Stomach/Bowel: Stomach is within normal limits. Appendix appears normal. No  evidence of bowel wall thickening, distention, or inflammatory changes. Vascular/Lymphatic: No significant vascular findings are present. No enlarged abdominal or pelvic lymph nodes. Reproductive: Prostatomegaly, measuring up to 1.6 cm. Other: Interval resolution of soft tissue nodularity of the omentum. Musculoskeletal: Increased number of sclerotic lesions seen in the thoracolumbar spine. IMPRESSION: Pulmonary embolus of the distal left pulmonary artery extending into the left lower lobe lobar pulmonary artery. Decreased size of left lower lobe masslike opacity and mediastinal left supraclavicular lymph nodes, and resolution of omental/peritoneal nodularity. New peribronchovascular predominant irregular ground-glass opacities seen in the left upper lobe, likely infectious or inflammatory, including drug toxicity. Interval resolution of upper lobe predominant micro nodules which were possibly metastatic versus infectious or inflammatory in etiology. Moderate left pleural effusion, decreased in size compared to prior exam. Increased number of sclerotic bone metastases when compared with prior exam, likely due to treatment response. Electronically Signed: By: Yetta Glassman M.D. On: 06/27/2021 12:56    ASSESSMENT & PLAN:   Cancer of lower lobe of left lung ([place]) # STAGE IV- Left upper lobe lung cancer-adenocarcinoma the lung;  EGFR- 19 del POSITIVE. 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  Currently on osimertinib 80 mg a day [osimertinib [since June 28th, 2022].  September 13th- CT scan chest and pelvis-decrease size of the left lower lobe mass/mediastinal adenopathy supraclavicular lymph nodes/omental/peritoneal nodularity.  Improvement of the contralateral lung nodules. Moderate left pleural effusion, decreased in size compared to prior exam.  #Skin rash acne-G-. Likely osimertinib-improved with steroid ointment.  Petechial rash bilateral lower extremities unclear etiology-platelets  normal.  Question steroids-see below.  #Multiple brain mets subcentimeter asymptomatic [NO RT]- . MRI brain AUG 16th-significant response noted. DEXAMETHASONE  0.5 mg once a day for 1 week; and 0.84m every other day x 1 week- and then STOP   #Left lung PE [incidental on CT SEP 13th,2022-]?Symptomatic DVT of the right calf-on Eliquis pain improved.  Continue monitoring.  #Bone metastases-currently on hold Zometa because of low calcium 8.4; ?  Osimertinib.  September 13-CT scan shows "increasing sclerotic lesions"/likely secondary to healing process.  Dental evaluation on 9/21.  Continue calcium vitamin D intake.  # Cardiac arrhythmia-SVT s/p adenosine post thoracentesis [  June 2022]; Hx of WPW-stable on metoprolol. STABLE.  # DISPOSITION: # HOLD zometa; de-access # follow up in 4 week-MD; labs- cbc/cmp; possible zometa;Dr.B                 -----------------------------------------------------------------------   DEXAMETHASONE  0.5 mg once a day for 1 week; and 0.87m every other day x 1 week- and then STOP   # I reviewed the blood work- with the patient in detail; also reviewed the imaging independently [as summarized above]; and with the patient in detail.        All questions were answered. The patient knows to call the clinic with any problems, questions or concerns.    GCammie Sickle MD 07/01/2021 1:00 AM

## 2021-06-30 NOTE — Assessment & Plan Note (Addendum)
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  Currently on osimertinib 80 mg a day [osimertinib [since June 28th, 2022].  September 13th- CT scan chest and pelvis-decrease size of the left lower lobe mass/mediastinal adenopathy supraclavicular lymph nodes/omental/peritoneal nodularity.  Improvement of the contralateral lung nodules. Moderate left pleural effusion, decreased in size compared to prior exam.  #Skin rash acne-G-. Likely osimertinib-improved with steroid ointment.  Petechial rash bilateral lower extremities unclear etiology-platelets normal.  Question steroids-see below.  #Multiple brain mets subcentimeter asymptomatic [NO RT]- . MRI brain AUG 16th-significant response noted. DEXAMETHASONE  0.5 mg once a day for 1 week; and 0.$RemoveBef'5mg'[id number]$  every other day x 1 week- and then STOP   #Left lung PE [incidental on CT SEP 13th,2022-]?Symptomatic DVT of the right calf-on Eliquis pain improved.  Continue monitoring.  #Bone metastases-currently on hold Zometa because of low calcium 8.4; ?  Osimertinib.  September 13-CT scan shows "increasing sclerotic lesions"/likely secondary to healing process.  Dental evaluation on 9/21.  Continue calcium vitamin D intake.  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 STABLE.  # DISPOSITION: # HOLD zometa; de-access # follow up in 4 week-MD; labs- cbc/cmp; possible zometa;Dr.B                 -----------------------------------------------------------------------   DEXAMETHASONE  0.5 mg once a day for 1 week; and 0.$RemoveBef'5mg'EzoTsWganH$  every other day x 1 week- and then STOP   # I reviewed the blood work- with the patient in detail; also reviewed the imaging independently [as summarized above]; and with the patient in detail.

## 2021-06-30 NOTE — Patient Instructions (Signed)
DEXAMETHASONE  0.5 mg once a day for 1 week; and 0.5mg  every other day x 1 week- and then STOP

## 2021-07-01 ENCOUNTER — Encounter: Payer: Self-pay | Admitting: Internal Medicine

## 2021-07-01 ENCOUNTER — Encounter: Payer: Self-pay | Admitting: Nurse Practitioner

## 2021-07-04 ENCOUNTER — Telehealth: Payer: Self-pay

## 2021-07-04 ENCOUNTER — Telehealth: Payer: Self-pay | Admitting: *Deleted

## 2021-07-04 DIAGNOSIS — C3432 Malignant neoplasm of lower lobe, left bronchus or lung: Secondary | ICD-10-CM

## 2021-07-04 NOTE — Telephone Encounter (Signed)
Per v/o Dr/ Rogue Bussing- added  vitD-25 [place] levels; and mag at next visit.

## 2021-07-04 NOTE — Telephone Encounter (Addendum)
Nutrition  Spoke with patient via phone regarding calcium supplementation.   Continue oral intake of calcium rich foods.  Recommend switching oral supplements to calcium carbonate (higher elemental calcium content) and take 500-600 mg tablet 3 times per day with food. Options available on the shelf at [facility] (equate calcium 600mg , caltrate 600mg , [place] 600mg , Nature Made 500mg  or 600mg  tab on [employer]. Recommend checking magnesium level and Vit D and supplementing as needed.  Discussed above with Dr B and agreeable with plan.  Patient aware and verbalized understanding.    Rileigh Kawashima B. Zenia Resides, [place], [place] Registered Dietitian [phone] (mobile)

## 2021-07-10 ENCOUNTER — Encounter: Payer: Self-pay | Admitting: Internal Medicine

## 2021-07-13 ENCOUNTER — Other Ambulatory Visit: Payer: Self-pay | Admitting: Oncology

## 2021-07-13 ENCOUNTER — Inpatient Hospital Stay (HOSPITAL_BASED_OUTPATIENT_CLINIC_OR_DEPARTMENT_OTHER): Payer: Managed Care, Other (non HMO) | Admitting: Oncology

## 2021-07-13 ENCOUNTER — Other Ambulatory Visit: Payer: Self-pay

## 2021-07-13 ENCOUNTER — Encounter: Payer: Self-pay | Admitting: Internal Medicine

## 2021-07-13 DIAGNOSIS — R509 Fever, unspecified: Secondary | ICD-10-CM | POA: Diagnosis not present

## 2021-07-13 DIAGNOSIS — R059 Cough, unspecified: Secondary | ICD-10-CM

## 2021-07-13 DIAGNOSIS — C3492 Malignant neoplasm of unspecified part of left bronchus or lung: Secondary | ICD-10-CM | POA: Diagnosis not present

## 2021-07-13 MED ORDER — APIXABAN 5 MG PO TABS: ORAL_TABLET | ORAL | 2 refills | Status: DC

## 2021-07-13 MED ORDER — AZITHROMYCIN 250 MG PO TABS: ORAL_TABLET | ORAL | 0 refills | Status: DC

## 2021-07-13 MED ORDER — SERTRALINE HCL 50 MG PO TABS: 25.0000 mg | ORAL_TABLET | Freq: Every day | ORAL | 2 refills | Status: DC

## 2021-07-13 NOTE — Progress Notes (Signed)
Symptom Management Consult note [facility]  [phone] [phone]  Patient Care Team: Pcp, No as PCP - General [name], RN as Oncology Nurse Navigator   Name of the patient: [name]  [id number]  1980/10/21   Date of visit: 07/13/2021   Diagnosis- EGFR lung cancer   Chief complaint/ Reason for visit- Cold like symptoms  I connected with Staci Righter on 07/13/21 at 11:00 AM EDT by video enabled telemedicine visit and verified that I am speaking with the correct person using two identifiers.   I discussed the limitations, risks, security and privacy concerns of performing an evaluation and management service by telemedicine and the availability of in-person appointments. I also discussed with the patient that there may be a patient responsible charge related to this service. The patient expressed understanding and agreed to proceed.   Other persons participating in the visit and their role in the encounter: NP, Patient    Patient's location: Home  Provider's location: Clinic    Heme/Onc history: Mr. 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  He is followed by Dr. Rogue Bussing and is status post 1 cycle of carbo and Alimta chemotherapy which was switched to [place] for EGFR deletion 19 mutation.  He started his medication on 04/11/2021 and has been tolerating well.  Interval history-patient presents virtually today for acute onset fever, cough, sore throat and runny nose that started approximately 12 hours ago.  Denies any exposure to COVID-19.  Denies any other sick contacts.  Reports a T-max of 101.5 this morning around 7 AM.  He has not tried anything over-the-counter because he was scared to.  Reports 2 negative COVID test this morning.  ECOG FS:1 - Symptomatic but completely  ambulatory  Review of systems- Review of Systems  Constitutional:  Positive for fever and malaise/fatigue.  HENT:  Positive for sore throat.   Respiratory:  Positive for cough, sputum production and shortness of breath.   Neurological:  Positive for seizures.  Psychiatric/Behavioral:  The patient is nervous/anxious.     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     Past Surgical History:  Procedure Laterality Date   FRACTURE SURGERY     broke femur when he was 6 years   PORTA CATH INSERTION N/A 03/28/2021   Procedure: PORTA CATH INSERTION;  Surgeon: Katha Cabal, MD;  Location: [facility];  Service: Cardiovascular;  Laterality: N/A;   VIDEO BRONCHOSCOPY WITH ENDOBRONCHIAL NAVIGATION N/A 03/15/2021   Procedure: VIDEO BRONCHOSCOPY WITH ENDOBRONCHIAL NAVIGATION;  Surgeon: Ottie Glazier, MD;  Location: [facility];  Service: Thoracic;  Laterality: N/A;   VIDEO BRONCHOSCOPY WITH ENDOBRONCHIAL ULTRASOUND N/A 03/15/2021   Procedure: VIDEO BRONCHOSCOPY WITH ENDOBRONCHIAL ULTRASOUND;  Surgeon: Ottie Glazier, MD;  Location: [facility];  Service: Thoracic;  Laterality: N/A;    Social History   Socioeconomic History   Marital status: Married    Spouse name: [name]   Number of children: 2   Years of education: Not on file   Highest education level: Not on file  Occupational History  Not on file  Tobacco Use   Smoking status: Never   Smokeless tobacco: Current    Types: Snuff, Chew  Vaping Use   Vaping Use: Never used  Substance and Sexual Activity   Alcohol use: Never   Drug use: Never   Sexual activity: Not on file  Other Topics Concern   Not on file  Social History Narrative   [place]; with wife; 2 daughters[12 and 22]; never smoked; rare alcohol.  Work in [employer]    Social Determinants of Health   Financial Resource Strain: Not on file  Food Insecurity: Not on file  Transportation Needs: Not on file  Physical Activity: Not on file  Stress: Not on file  Social Connections: Not on file  Intimate Partner Violence: Not on file    Family History  Problem Relation Age of Onset   High Cholesterol Mother    Hypertension Father    Cancer Father    Lung cancer Father    Skin cancer Father      Current Outpatient Medications:    azithromycin (ZITHROMAX) 250 MG tablet, Take 2 tablets (500 mg) on day 1 and 1 tablet (250 mg) days 2-5., Disp: 6 each, Rfl: 0   apixaban (ELIQUIS) 5 MG TABS tablet, Take 1 tablet (63m) twice daily, Disp: 60 tablet, Rfl: 2   chlorpheniramine-HYDROcodone (TUSSIONEX) 10-8 MG/5ML SUER, Take 5 mLs by mouth every 12 (twelve) hours as needed for cough. (Patient not taking: No sig reported), Disp: 140 mL, Rfl: 0   clindamycin (CLINDAGEL) 1 % gel, Apply topically 2 (two) times daily. Apply to affected areas twice a day., Disp: 30 g, Rfl: 0   dexamethasone (DECADRON) 1 MG tablet, Take 1 tablet (1 mg total) by mouth 2 (two) times daily with a meal., Disp: 60 tablet, Rfl: 1   folic acid (FOLVITE) 1 MG tablet, Take 1 tablet (1 mg total) by mouth daily., Disp: 90 tablet, Rfl: 1   lidocaine-prilocaine (EMLA) cream, Apply 1 application topically as needed., Disp: 30 g, Rfl: 0   LORazepam (ATIVAN) 0.5 MG tablet, Take 1 tablet (0.5 mg total) by mouth every 8 (eight) hours., Disp: 30 tablet, Rfl: 0   metoprolol succinate (TOPROL XL) 25 MG 24 hr tablet, Take 1 tablet (25 mg total) by mouth daily. (Patient taking differently: Take 50 mg by mouth daily.), Disp: 30 tablet, Rfl: 1   ondansetron (ZOFRAN) 8 MG tablet, One pill every 8 hours as needed for nausea/vomitting. (Patient not taking: No sig reported), Disp: 40 tablet, Rfl: 1   osimertinib mesylate (TAGRISSO) 80 MG tablet, Take 1 tablet (80 mg total) by mouth daily., Disp: 30  tablet, Rfl: 4   prochlorperazine (COMPAZINE) 10 MG tablet, Take 1 tablet (10 mg total) by mouth every 6 (six) hours as needed for nausea or vomiting. (Patient not taking: No sig reported), Disp: 40 tablet, Rfl: 1   sertraline (ZOLOFT) 50 MG tablet, Take 0.5 tablets (25 mg total) by mouth daily., Disp: 30 tablet, Rfl: 2  Physical exam: There were no vitals filed for this visit. Physical Exam Constitutional:      General: He is not in acute distress.    Appearance: Normal appearance. He is not ill-appearing or toxic-appearing.  Pulmonary:     Effort: Pulmonary effort is normal.  Neurological:     Mental Status: He is alert and oriented to person, place, and time.     CMP Latest Ref Rng & Units 06/30/2021  Glucose 70 - 99  mg/dL 129(H)  BUN 6 - 20 mg/dL 14  Creatinine 0.61 - 1.24 mg/dL 0.98  Sodium 135 - 145 mmol/L 135  Potassium 3.5 - 5.1 mmol/L 3.5  Chloride 98 - 111 mmol/L 102  CO2 22 - 32 mmol/L 25  Calcium 8.9 - 10.3 mg/dL 8.4(L)  Total Protein 6.5 - 8.1 g/dL 6.9  Total Bilirubin 0.3 - 1.2 mg/dL 0.6  Alkaline Phos 38 - 126 U/L 79  AST 15 - 41 U/L 25  ALT 0 - 44 U/L 34   CBC Latest Ref Rng & Units 06/30/2021  WBC 4.0 - 10.5 K/uL 13.6(H)  Hemoglobin 13.0 - 17.0 g/dL 14.2  Hematocrit 39.0 - 52.0 % 44.0  Platelets 150 - 400 K/uL 213    No images are attached to the encounter.  CT CHEST ABDOMEN PELVIS W CONTRAST  Addendum Date: 06/27/2021   ADDENDUM REPORT: 06/27/2021 13:14 ADDENDUM: Critical Value/emergent results were called by telephone at the time of interpretation on 06/27/2021 at 1:11 pm to provider [facility] , who verbally acknowledged these results. Electronically Signed   By: Yetta Glassman M.D.   On: 06/27/2021 13:14   Result Date: 06/27/2021 CLINICAL DATA:  Non-small-cell lung cancer EXAM: CT CHEST, ABDOMEN, AND PELVIS WITH CONTRAST TECHNIQUE: Multidetector CT imaging of the chest, abdomen and pelvis was performed following the standard protocol during bolus  administration of intravenous contrast. CONTRAST:  147m OMNIPAQUE IOHEXOL 350 MG/ML SOLN COMPARISON:  PET-CT dated March 27, 2021; CT chest dated March 15, 2021 FINDINGS: CT CHEST FINDINGS Cardiovascular: Normal heart size. No pericardial effusion. No significant coronary artery calcifications. No atherosclerotic disease of the thoracic aorta. Pulmonary embolus of the distal left pulmonary artery extending into the left lower lobe lobar artery. Right sided port with tip in the right atrium. Mediastinum/Nodes: Esophagus is unremarkable. Decreased size of mediastinal lymph nodes. Reference right upper paratracheal lymph node measures 5 mm in short axis on series 2, image 23, previously 1.1 cm. Left supraclavicular lymph nodes are no longer visualized. Lungs/Pleura: Moderate left pleural effusion, decreased in size compared to prior PET/CT. Decreased size of masslike opacity in the left lower lobe which measures approximately 2.2 x 1.9 cm, previously 7.0 x 5.8 cm. Adjacent linear opacities likely represent posttreatment change. Interval resolution of upper lobe predominant micro nodules. Larger solid nodule of the right middle lobe is also no longer apparent. New peribronchovascular and perifissural irregular ground-glass opacities are seen predominantly in the left upper lobe. Musculoskeletal: Increased of sclerotic lesions seen in the thoracic spine, ribs and bilateral scapula. CT ABDOMEN PELVIS FINDINGS Hepatobiliary: No focal liver abnormality is seen. No gallstones, gallbladder wall thickening, or biliary dilatation. Pancreas: Unremarkable. No pancreatic ductal dilatation or surrounding inflammatory changes. Spleen: Normal in size without focal abnormality. Soft tissue density at the splenic hilum is no longer present. Adrenals/Urinary Tract: Adrenal glands are unremarkable. Kidneys are normal, without renal calculi, focal lesion, or hydronephrosis. Bladder is unremarkable. Stomach/Bowel: Stomach is within normal  limits. Appendix appears normal. No evidence of bowel wall thickening, distention, or inflammatory changes. Vascular/Lymphatic: No significant vascular findings are present. No enlarged abdominal or pelvic lymph nodes. Reproductive: Prostatomegaly, measuring up to 1.6 cm. Other: Interval resolution of soft tissue nodularity of the omentum. Musculoskeletal: Increased number of sclerotic lesions seen in the thoracolumbar spine. IMPRESSION: Pulmonary embolus of the distal left pulmonary artery extending into the left lower lobe lobar pulmonary artery. Decreased size of left lower lobe masslike opacity and mediastinal left supraclavicular lymph nodes, and resolution of omental/peritoneal nodularity.  New peribronchovascular predominant irregular ground-glass opacities seen in the left upper lobe, likely infectious or inflammatory, including drug toxicity. Interval resolution of upper lobe predominant micro nodules which were possibly metastatic versus infectious or inflammatory in etiology. Moderate left pleural effusion, decreased in size compared to prior exam. Increased number of sclerotic bone metastases when compared with prior exam, likely due to treatment response. Electronically Signed: By: Yetta Glassman M.D. On: 06/27/2021 12:56     Assessment and plan- Patient is a 40 y.o. male who presents for fever, cough, sore throat and nasal congestion for the past 12 hours.    Mr. Sermeno sounds and looks stable.  He recently had labs and imaging that were stable.  Lab work showed white blood cell count of 13.6 but otherwise was unremarkable.  CT CAP showed significant improvement of burden of disease.  Incidentally noted PE and distal left pulmonary artery.  He is still on Tagrisso which makes him immunocompromised.  He is not been neutropenic while on Tagrisso so we will treat outpatient.  Patient knows if his symptoms worsens to call the clinic or be seen in the emergency room.  Recommend he recheck his COVID  testing first thing in the morning.  Will send in [place]. If he does turn out to be COVID-positive, recommend he stop the antibiotics and we can start him on antiviral.  Patient will call clinic in the morning.  For his cough, recommend over-the-counter Delsym or Robitussin.  He can take Tylenol every 6-8 hours for fever.   Disposition- Start Z-Pak today.  Recheck COVID testing in the morning.  RTC as scheduled on 07/28/2021 for follow-up with Dr. Rogue Bussing.   Visit Diagnosis 1. Cough   2. Non-small cell cancer of left lung ([place])   3. Fever, unspecified fever cause     Patient expressed understanding and was in agreement with this plan. He also understands that He can call clinic at any time with any questions, concerns, or complaints.   I spent 20 minutes dedicated to the care of this patient (face-to-face and non-face-to-face) on the date of the encounter to include what is described in the assessment and plan. .   Thank you for allowing me to participate in the care of this very pleasant patient.    Jacquelin Hawking, NP [place] at [facility] Cell - [phone] Pager- [phone] 07/13/2021 3:02 PM

## 2021-07-13 NOTE — Progress Notes (Signed)
Patient scheduled for chest x-ray and additional lab work.  Will call with results.  Faythe Casa, NP 07/13/2021 3:19 PM

## 2021-07-14 ENCOUNTER — Ambulatory Visit
Admission: RE | Admit: 2021-07-14 | Discharge: 2021-07-14 | Disposition: A | Discharge status: Home / Self Care | Payer: Managed Care, Other (non HMO) | Attending: Oncology | Admitting: Oncology

## 2021-07-14 ENCOUNTER — Inpatient Hospital Stay: Payer: Managed Care, Other (non HMO)

## 2021-07-14 ENCOUNTER — Encounter: Payer: Self-pay | Admitting: Internal Medicine

## 2021-07-14 ENCOUNTER — Ambulatory Visit
Admission: RE | Admit: 2021-07-14 | Discharge: 2021-07-14 | Disposition: A | Discharge status: Home / Self Care | Payer: Managed Care, Other (non HMO) | Source: Ambulatory Visit | Attending: Oncology | Admitting: Oncology

## 2021-07-14 DIAGNOSIS — R509 Fever, unspecified: Secondary | ICD-10-CM | POA: Insufficient documentation

## 2021-07-14 DIAGNOSIS — R059 Cough, unspecified: Secondary | ICD-10-CM | POA: Diagnosis present

## 2021-07-14 LAB — CBC WITH DIFFERENTIAL/PLATELET
Abs Immature Granulocytes: 0.13 10*3/uL — ABNORMAL HIGH (ref 0.00–0.07)
Basophils Absolute: 0.1 10*3/uL (ref 0.0–0.1)
Basophils Relative: 1 %
Eosinophils Absolute: 0.2 10*3/uL (ref 0.0–0.5)
Eosinophils Relative: 3 %
HCT: 45.9 % (ref 39.0–52.0)
Hemoglobin: 15.4 g/dL (ref 13.0–17.0)
Immature Granulocytes: 2 %
Lymphocytes Relative: 13 %
Lymphs Abs: 1.1 10*3/uL (ref 0.7–4.0)
MCH: 32 pg (ref 26.0–34.0)
MCHC: 33.6 g/dL (ref 30.0–36.0)
MCV: 95.4 fL (ref 80.0–100.0)
Monocytes Absolute: 0.8 10*3/uL (ref 0.1–1.0)
Monocytes Relative: 9 %
Neutro Abs: 6 10*3/uL (ref 1.7–7.7)
Neutrophils Relative %: 72 %
Platelets: 178 10*3/uL (ref 150–400)
RBC: 4.81 MIL/uL (ref 4.22–5.81)
RDW: 16.1 % — ABNORMAL HIGH (ref 11.5–15.5)
WBC: 8.3 10*3/uL (ref 4.0–10.5)
nRBC: 0 % (ref 0.0–0.2)

## 2021-07-14 LAB — COMPREHENSIVE METABOLIC PANEL
ALT: 35 U/L (ref 0–44)
AST: 22 U/L (ref 15–41)
Albumin: 3.7 g/dL (ref 3.5–5.0)
Alkaline Phosphatase: 87 U/L (ref 38–126)
Anion gap: 8 (ref 5–15)
BUN: 12 mg/dL (ref 6–20)
CO2: 22 mmol/L (ref 22–32)
Calcium: 8.5 mg/dL — ABNORMAL LOW (ref 8.9–10.3)
Chloride: 102 mmol/L (ref 98–111)
Creatinine, Ser: 1.14 mg/dL (ref 0.61–1.24)
GFR, Estimated: >60 mL/min (ref >60)
Glucose, Bld: 141 mg/dL — ABNORMAL HIGH (ref 70–99)
Potassium: 3.4 mmol/L — ABNORMAL LOW (ref 3.5–5.1)
Sodium: 132 mmol/L — ABNORMAL LOW (ref 135–145)
Total Bilirubin: 0.8 mg/dL (ref 0.3–1.2)
Total Protein: 7.8 g/dL (ref 6.5–8.1)

## 2021-07-14 LAB — MAGNESIUM: Magnesium: 1.9 mg/dL (ref 1.7–2.4)

## 2021-07-14 IMAGING — CR DG CHEST 2V
1 series · 2 of 2 positions shown · non-contrast
Comparison: Multiple priors, most recently chest x-ray [DATE]
in CT of the chest, abdomen and pelvis [DATE].

CLINICAL DATA: 39-year-old male with history of cough, congestion
and fever. Left lower lobe lung cancer.

EXAM:
CHEST - 2 VIEW

[Series 1: dg chest 2 view · 0.14mm/px · 2 of 2 slices shown]
[im 1/2]
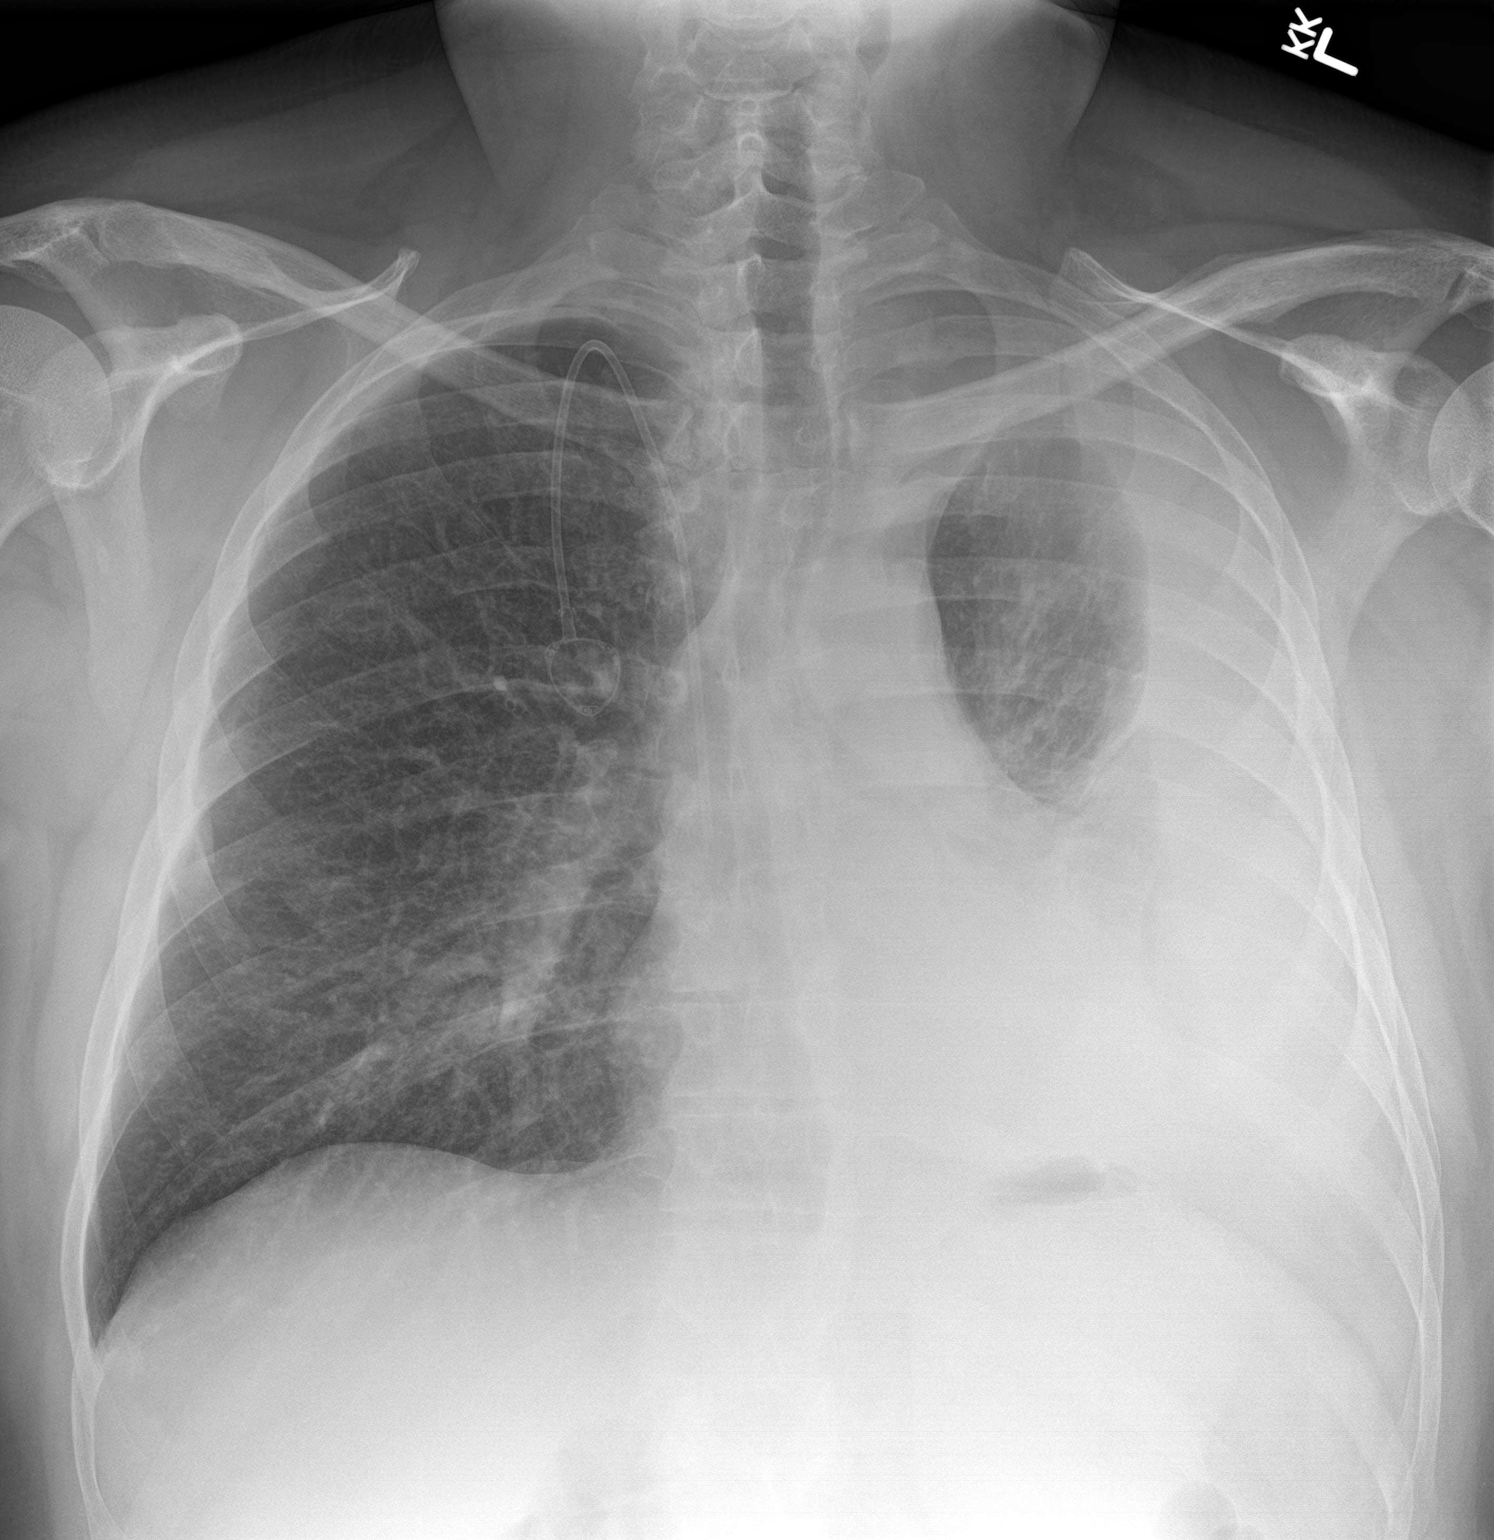
[im 2/2]
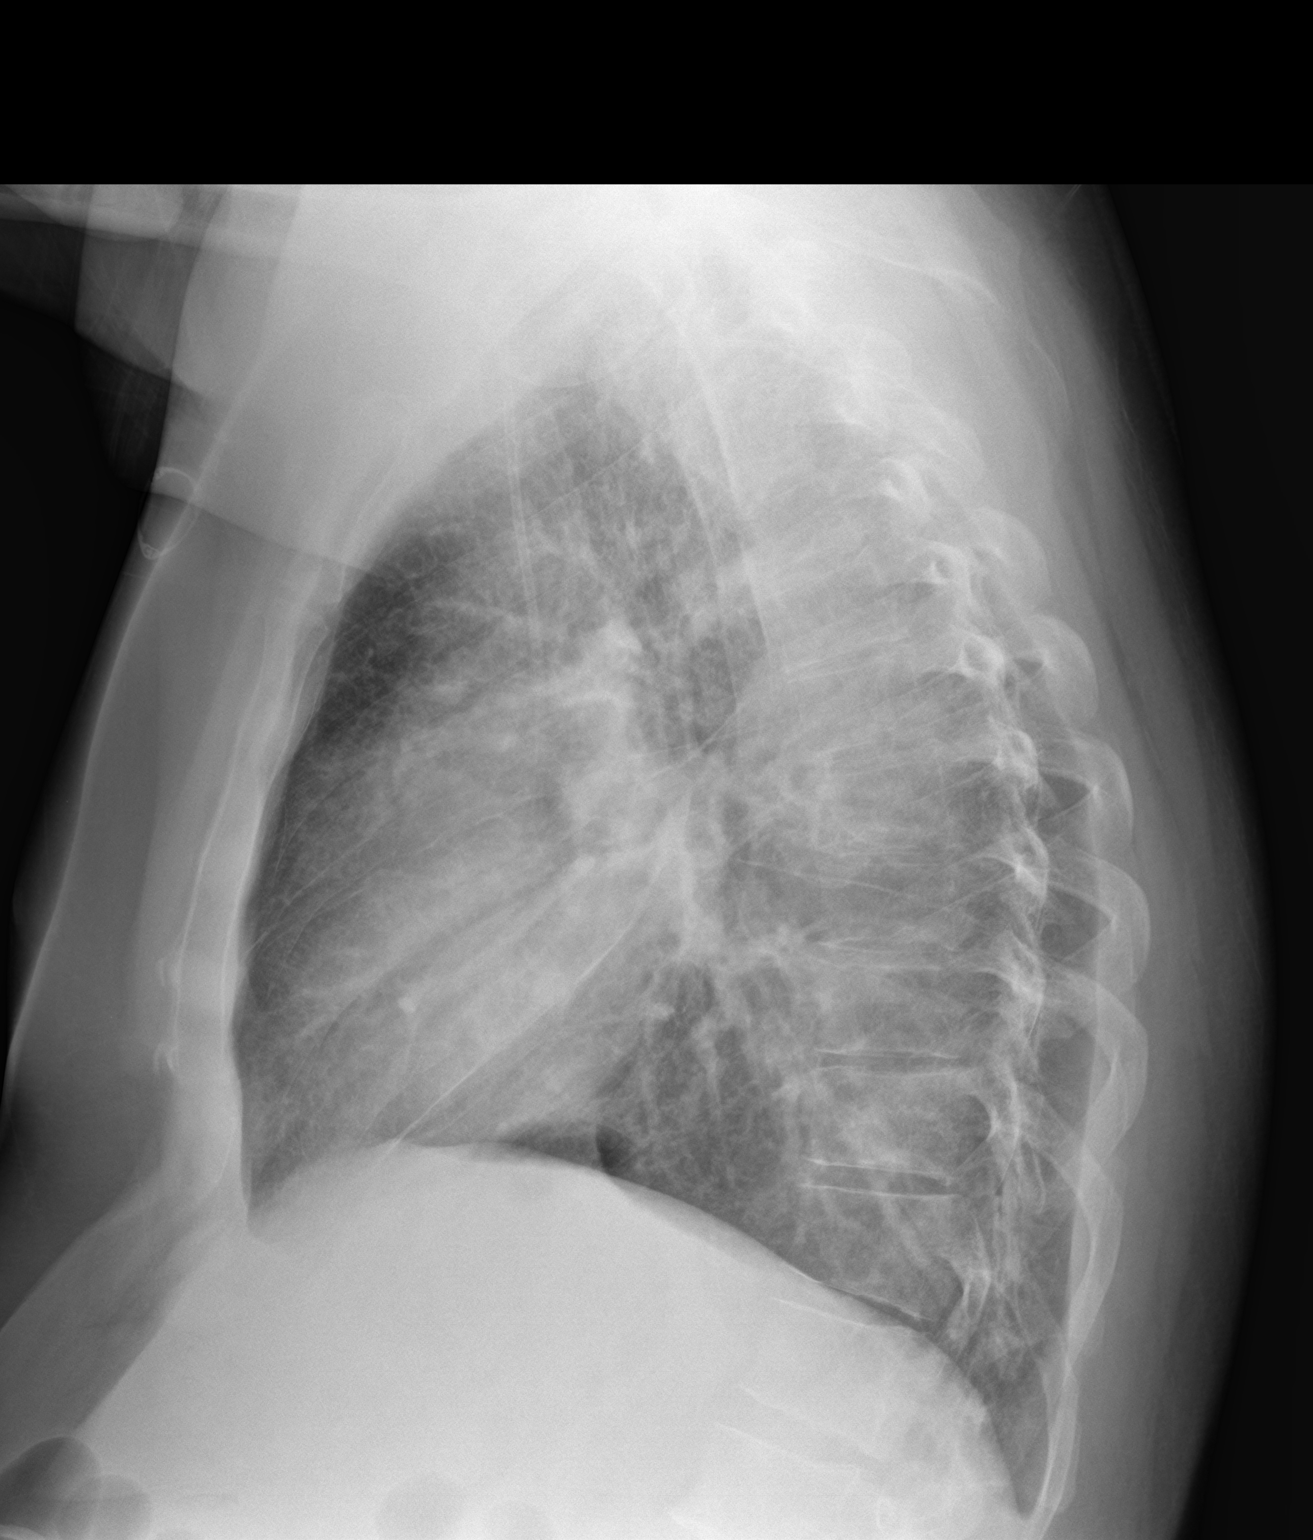

[2 of 2 positions shown; findings below may reference images not displayed]

FINDINGS: Right internal jugular single-lumen porta cath with tip terminating
in the superior cavoatrial junction. Chronic large left pleural
effusion with areas of atelectasis and/or consolidation in the left
mid to lower lung, similar to the prior examination. Diffuse
peribronchial cuffing, increased compared to the prior study right
lung is otherwise clear. No right pleural effusion. No pneumothorax.
Heart size appears normal. Upper mediastinal contours are
unremarkable.
IMPRESSION: 1. Increasing diffuse peribronchial cuffing compared to the prior
study, which may suggest an acute bronchitis.
2. Radiographic appearance of the chest is otherwise stable, as
above.

## 2021-07-14 NOTE — Progress Notes (Signed)
Chest x-ray looks like acute bronchitis. Labs are stable. I am going to call him and see how he is doing. I think he takes steroids but I am going to double check.   Faythe Casa, NP 07/14/2021 9:47 AM

## 2021-07-18 ENCOUNTER — Encounter: Payer: Self-pay | Admitting: Internal Medicine

## 2021-07-18 ENCOUNTER — Encounter: Payer: Self-pay | Admitting: Nurse Practitioner

## 2021-07-18 NOTE — Telephone Encounter (Signed)
Called patient to follow-up. He states that he is feeling much better, but still has a cough and runny nose. Denies fever. Offered visit in [facility] but he declined. Encouraged to call us back if symptoms worsen or persist.

## 2021-07-19 ENCOUNTER — Other Ambulatory Visit: Payer: Self-pay | Admitting: *Deleted

## 2021-07-19 ENCOUNTER — Other Ambulatory Visit: Payer: Self-pay | Admitting: Oncology

## 2021-07-19 DIAGNOSIS — C3492 Malignant neoplasm of unspecified part of left bronchus or lung: Secondary | ICD-10-CM

## 2021-07-19 MED ORDER — PREDNISONE 10 MG (21) PO TBPK: ORAL_TABLET | ORAL | 0 refills | Status: DC

## 2021-07-19 MED ORDER — SERTRALINE HCL 50 MG PO TABS: 25.0000 mg | ORAL_TABLET | Freq: Every day | ORAL | 2 refills | Status: DC

## 2021-07-28 ENCOUNTER — Encounter: Payer: Self-pay | Admitting: Internal Medicine

## 2021-07-28 ENCOUNTER — Inpatient Hospital Stay: Payer: Managed Care, Other (non HMO)

## 2021-07-28 ENCOUNTER — Other Ambulatory Visit: Payer: Self-pay

## 2021-07-28 ENCOUNTER — Inpatient Hospital Stay (HOSPITAL_BASED_OUTPATIENT_CLINIC_OR_DEPARTMENT_OTHER): Payer: Managed Care, Other (non HMO) | Admitting: Internal Medicine

## 2021-07-28 ENCOUNTER — Inpatient Hospital Stay: Payer: Managed Care, Other (non HMO) | Attending: Internal Medicine

## 2021-07-28 VITALS — BP 124/87 | HR 77 | Temp 97.6°F | Resp 16 | Wt 234.6 lb

## 2021-07-28 DIAGNOSIS — R21 Rash and other nonspecific skin eruption: Secondary | ICD-10-CM | POA: Diagnosis not present

## 2021-07-28 DIAGNOSIS — Z86718 Personal history of other venous thrombosis and embolism: Secondary | ICD-10-CM | POA: Insufficient documentation

## 2021-07-28 DIAGNOSIS — C3492 Malignant neoplasm of unspecified part of left bronchus or lung: Secondary | ICD-10-CM

## 2021-07-28 DIAGNOSIS — C3432 Malignant neoplasm of lower lobe, left bronchus or lung: Secondary | ICD-10-CM | POA: Insufficient documentation

## 2021-07-28 DIAGNOSIS — Z86711 Personal history of pulmonary embolism: Secondary | ICD-10-CM | POA: Diagnosis not present

## 2021-07-28 DIAGNOSIS — C7931 Secondary malignant neoplasm of brain: Secondary | ICD-10-CM | POA: Diagnosis not present

## 2021-07-28 DIAGNOSIS — Z7901 Long term (current) use of anticoagulants: Secondary | ICD-10-CM | POA: Diagnosis not present

## 2021-07-28 DIAGNOSIS — Z79899 Other long term (current) drug therapy: Secondary | ICD-10-CM | POA: Insufficient documentation

## 2021-07-28 DIAGNOSIS — I2699 Other pulmonary embolism without acute cor pulmonale: Secondary | ICD-10-CM | POA: Insufficient documentation

## 2021-07-28 DIAGNOSIS — C7951 Secondary malignant neoplasm of bone: Secondary | ICD-10-CM | POA: Insufficient documentation

## 2021-07-28 DIAGNOSIS — Z452 Encounter for adjustment and management of vascular access device: Secondary | ICD-10-CM | POA: Diagnosis not present

## 2021-07-28 DIAGNOSIS — I471 Supraventricular tachycardia: Secondary | ICD-10-CM | POA: Diagnosis not present

## 2021-07-28 LAB — COMPREHENSIVE METABOLIC PANEL
ALT: 24 U/L (ref 0–44)
AST: 20 U/L (ref 15–41)
Albumin: 3.3 g/dL — ABNORMAL LOW (ref 3.5–5.0)
Alkaline Phosphatase: 82 U/L (ref 38–126)
Anion gap: 5 (ref 5–15)
BUN: 16 mg/dL (ref 6–20)
CO2: 26 mmol/L (ref 22–32)
Calcium: 8.3 mg/dL — ABNORMAL LOW (ref 8.9–10.3)
Chloride: 105 mmol/L (ref 98–111)
Creatinine, Ser: 1.19 mg/dL (ref 0.61–1.24)
GFR, Estimated: >60 mL/min (ref >60)
Glucose, Bld: 103 mg/dL — ABNORMAL HIGH (ref 70–99)
Potassium: 3.8 mmol/L (ref 3.5–5.1)
Sodium: 136 mmol/L (ref 135–145)
Total Bilirubin: 0.5 mg/dL (ref 0.3–1.2)
Total Protein: 6.6 g/dL (ref 6.5–8.1)

## 2021-07-28 LAB — CBC WITH DIFFERENTIAL/PLATELET
Abs Immature Granulocytes: 0.38 10*3/uL — ABNORMAL HIGH (ref 0.00–0.07)
Basophils Absolute: 0.1 10*3/uL (ref 0.0–0.1)
Basophils Relative: 1 %
Eosinophils Absolute: 0.2 10*3/uL (ref 0.0–0.5)
Eosinophils Relative: 2 %
HCT: 42.9 % (ref 39.0–52.0)
Hemoglobin: 14 g/dL (ref 13.0–17.0)
Immature Granulocytes: 4 %
Lymphocytes Relative: 24 %
Lymphs Abs: 2.2 10*3/uL (ref 0.7–4.0)
MCH: 31.3 pg (ref 26.0–34.0)
MCHC: 32.6 g/dL (ref 30.0–36.0)
MCV: 96 fL (ref 80.0–100.0)
Monocytes Absolute: 1.2 10*3/uL — ABNORMAL HIGH (ref 0.1–1.0)
Monocytes Relative: 13 %
Neutro Abs: 5 10*3/uL (ref 1.7–7.7)
Neutrophils Relative %: 56 %
Platelets: 305 10*3/uL (ref 150–400)
RBC: 4.47 MIL/uL (ref 4.22–5.81)
RDW: 14.7 % (ref 11.5–15.5)
WBC: 9 10*3/uL (ref 4.0–10.5)
nRBC: 0 % (ref 0.0–0.2)

## 2021-07-28 LAB — VITAMIN D 25 HYDROXY (VIT D DEFICIENCY, FRACTURES): Vit D, 25-Hydroxy: 51.42 ng/mL (ref 30–100)

## 2021-07-28 LAB — MAGNESIUM: Magnesium: 2.1 mg/dL (ref 1.7–2.4)

## 2021-07-28 MED ORDER — HEPARIN SOD (PORK) LOCK FLUSH 100 UNIT/ML IV SOLN
INTRAVENOUS | Status: AC
  Filled 2021-07-28: qty 5

## 2021-07-28 MED ORDER — HEPARIN SOD (PORK) LOCK FLUSH 100 UNIT/ML IV SOLN
500.0000 [IU] | Freq: Once | INTRAVENOUS | Status: AC
  Administered 2021-07-28: 500 [IU] via INTRAVENOUS
  Filled 2021-07-28: qty 5

## 2021-07-28 MED ORDER — SODIUM CHLORIDE 0.9% FLUSH
10.0000 mL | INTRAVENOUS | Status: AC | PRN
  Administered 2021-07-28: 10 mL via INTRAVENOUS
  Filled 2021-07-28: qty 10

## 2021-07-28 NOTE — Patient Instructions (Signed)
#  Recommend Lotrimin/generic ointment-twice a day on the affected area.  Keep the area dry/after shower with a towel.

## 2021-07-28 NOTE — Progress Notes (Signed)
Patient here for follow up he reports that he has rash on his buttocks that started about three weeks ago. The rash does not hurt but does itch.

## 2021-07-28 NOTE — Assessment & Plan Note (Signed)
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  Currently on osimertinib 80 mg a day [osimertinib [since June 28th, 2022].  September 13th- CT scan chest and pelvis-decrease size of the left lower lobe mass/mediastinal adenopathy supraclavicular lymph nodes/omental/peritoneal nodularity.  Improvement of the contralateral lung nodules. Moderate left pleural effusion, decreased in size compared to prior exam.  #Skin rash on buttocks- likely sec to fungal dermatitis-  Recommend Lotrimin/generic ointment-twice a day on the affected area.  Keep the area dry/after shower with a towel.   #Multiple brain mets subcentimeter asymptomatic [NO RT]- . MRI brain AUG 16th-significant response noted. DEXAMETHASONE [stopped on 10/2.].  Will repeat MRI in 1 months  #Left lung PE [incidental on CT SEP 13th,2022-]?Symptomatic DVT of the right calf-on Eliquis pain improved- STABLE.  #Bone metastases-  September 13-CT scan shows "increasing sclerotic lesions"/likely secondary to healing process.  Dental evaluation on 9/21.  Continue calcium vitamin D intake. Currently on hold Zometa because of low calcium 8.3; ?  Osimertinib. HOLD zometa.   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 STABLE.  # DISPOSITION: # HOLD zometa; de-access # follow up in 4 week-MD; labs- cbc/cmp; possible zometa;MRI brain- Dr.B

## 2021-07-28 NOTE — Progress Notes (Signed)
[facility] CONSULT NOTE  Patient Care Team: Pcp, No as PCP - General [name], RN as Oncology Nurse Navigator  CHIEF COMPLAINTS/PURPOSE OF CONSULTATION: Lung cancer  #  Oncology History Overview Note  IMPRESSION: 1. Patchy nodular fat stranding throughout the anterior left upper quadrant peritoneal fat, nonspecific, cannot exclude peritoneal carcinomatosis. Dedicated CT abdomen/pelvis with oral and IV contrast recommended for further evaluation. 2. Dense patchy consolidation replacing much of the left lower lung lobe, appearing masslike in the superior segment left lower lobe, with associated bulging of the left major fissure and associated left lower lobe volume loss. Fine nodularity throughout both lungs with an upper lobe predominance. Asymmetric left upper lobe interlobular septal thickening. These findings are indeterminate, with differential including multilobar pneumonia, sarcoidosis or a neoplastic process. The persistence on radiographs back to 01/20/2021 despite antibiotic therapy make sarcoidosis or a neoplastic process more likely. Pulmonology consultation suggested for consideration of bronchoscopic evaluation. 3. Small dependent left pleural effusion. 4. Mild mediastinal lymphadenopathy, nonspecific. 5. Subacute healing lateral right sixth rib fracture.   DIAGNOSIS:  A. LUNG, LEFT LOWER LOBE; ENB-ASSISTED BIOPSY:  - NON-SMALL CELL CARCINOMA, FAVOR ADENOCARCINOMA.  - FOREIGN MATERIAL SUGGESTIVE OF ASPIRATION.   # EGFR MUTATED: 7 del- June 28th, 2022- [name].    Cancer of lower lobe of left lung ([place])  03/23/2021 Initial Diagnosis   Cancer of lower lobe of left lung ([place])   03/29/2021 Cancer Staging   Staging form: Lung, AJCC 8th Edition - Clinical: Stage IVB (cT3, cN3, pM1c) - Signed by [name], MD on 03/29/2021   03/30/2021 -  Chemotherapy    Patient is on Treatment Plan: LUNG NSCLC PEMETREXED (ALIMTA) /  CARBOPLATIN Q21D X 1 CYCLES          HISTORY OF PRESENTING ILLNESS: Ambulating independently.  With his wife  DEMECO DUCKSWORTH 40 y.o.  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  Patient complains of rash on his lower extremities/buttocks.  Itchy no pain.  In the interim patient was evaluated for cough-started on prednisone taper/antibiotic resolved.   Patient denies any nausea vomiting.  Denies any headaches.  Appetite is good.  No worsening shortness of breath or chest pain.  Review of Systems  Constitutional:  Positive for malaise/fatigue and weight loss. Negative for chills, diaphoresis and fever.  HENT:  Negative for nosebleeds and sore throat.   Eyes:  Negative for double vision.  Respiratory:  Positive for shortness of breath. Negative for wheezing.   Cardiovascular:  Negative for chest pain, palpitations, orthopnea and leg swelling.  Gastrointestinal:  Negative for abdominal pain, blood in stool, diarrhea, heartburn, melena, nausea and vomiting.  Genitourinary:  Negative for dysuria, frequency and urgency.  Musculoskeletal:  Negative for back pain and joint pain.  Skin:  Positive for rash. Negative for itching.  Neurological:  Negative for dizziness, tingling, focal weakness, weakness and headaches.  Endo/Heme/Allergies:  Does not bruise/bleed easily.  Psychiatric/Behavioral:  Negative for depression. The patient is not nervous/anxious and does not have insomnia.     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    SURGICAL HISTORY: Past Surgical History:  Procedure Laterality Date   FRACTURE SURGERY     broke femur when he was  6 years   PORTA CATH INSERTION N/A 03/28/2021   Procedure: PORTA CATH INSERTION;  Surgeon: Katha Cabal, MD;  Location:  [facility];  Service: Cardiovascular;  Laterality: N/A;   VIDEO BRONCHOSCOPY WITH ENDOBRONCHIAL NAVIGATION N/A 03/15/2021   Procedure: VIDEO BRONCHOSCOPY WITH ENDOBRONCHIAL NAVIGATION;  Surgeon: Ottie Glazier, MD;  Location: [facility];  Service: Thoracic;  Laterality: N/A;   VIDEO BRONCHOSCOPY WITH ENDOBRONCHIAL ULTRASOUND N/A 03/15/2021   Procedure: VIDEO BRONCHOSCOPY WITH ENDOBRONCHIAL ULTRASOUND;  Surgeon: Ottie Glazier, MD;  Location: [facility];  Service: Thoracic;  Laterality: N/A;    SOCIAL HISTORY: Social History   Socioeconomic History   Marital status: Married    Spouse name: [name]   Number of children: 2   Years of education: Not on file   Highest education level: Not on file  Occupational History   Not on file  Tobacco Use   Smoking status: Never   Smokeless tobacco: Current    Types: Snuff, Chew  Vaping Use   Vaping Use: Never used  Substance and Sexual Activity   Alcohol use: Never   Drug use: Never   Sexual activity: Not on file  Other Topics Concern   Not on file  Social History Narrative   [place]; with wife; 2 daughters[12 and 22]; never smoked; rare alcohol. Work in [employer]    Social Determinants of Health   Financial Resource Strain: Not on file  Food Insecurity: Not on file  Transportation Needs: Not on file  Physical Activity: Not on file  Stress: Not on file  Social Connections: Not on file  Intimate Partner Violence: Not on file    FAMILY HISTORY: Family History  Problem Relation Age of Onset   High Cholesterol Mother    Hypertension Father    Cancer Father    Lung cancer Father    Skin cancer Father     ALLERGIES:  is allergic to codeine.  MEDICATIONS:  Current Outpatient Medications  Medication Sig Dispense Refill   apixaban (ELIQUIS) 5 MG TABS tablet Take 1 tablet (58m) twice daily 60 tablet 2   clindamycin (CLINDAGEL) 1 % gel Apply topically 2 (two) times daily. Apply to affected areas twice a day. 30 g 0    folic acid (FOLVITE) 1 MG tablet Take 1 tablet (1 mg total) by mouth daily. 90 tablet 1   lidocaine-prilocaine (EMLA) cream Apply 1 application topically as needed. 30 g 0   metoprolol succinate (TOPROL XL) 25 MG 24 hr tablet Take 1 tablet (25 mg total) by mouth daily. (Patient taking differently: Take 50 mg by mouth daily.) 30 tablet 1   osimertinib mesylate (TAGRISSO) 80 MG tablet Take 1 tablet (80 mg total) by mouth daily. 30 tablet 4   sertraline (ZOLOFT) 50 MG tablet Take 0.5 tablets (25 mg total) by mouth daily. 30 tablet 2   dexamethasone (DECADRON) 1 MG tablet Take 1 tablet (1 mg total) by mouth 2 (two) times daily with a meal. (Patient not taking: Reported on 07/28/2021) 60 tablet 1   ondansetron (ZOFRAN) 8 MG tablet One pill every 8 hours as needed for nausea/vomitting. (Patient not taking: No sig reported) 40 tablet 1   prochlorperazine (COMPAZINE) 10 MG tablet Take 1 tablet (10 mg total) by mouth every 6 (six) hours as needed for nausea or vomiting. (Patient not taking: No sig reported) 40 tablet 1   No current facility-administered medications for this visit.   Facility-Administered Medications Ordered in Other Visits  Medication  Dose Route Frequency Provider Last Rate Last Admin   heparin lock flush 100 UNIT/ML injection            sodium chloride flush (NS) 0.9 % injection 10 mL  10 mL Intravenous PRN [name], MD   10 mL at 07/28/21 0852      .  PHYSICAL EXAMINATION: ECOG PERFORMANCE STATUS: 1 - Symptomatic but completely ambulatory  Vitals:   07/28/21 0912  BP: 124/87  Pulse: 77  Resp: 16  Temp: 97.6 F (36.4 C)  SpO2: 98%   Filed Weights   07/28/21 0912  Weight: 234 lb 9.6 oz (106.4 kg)     Physical Exam HENT:     Head: Normocephalic and atraumatic.     Mouth/Throat:     Pharynx: No oropharyngeal exudate.  Eyes:     Pupils: Pupils are equal, round, and reactive to light.  Cardiovascular:     Rate and Rhythm: Normal rate and regular rhythm.   Pulmonary:     Effort: No respiratory distress.     Breath sounds: No wheezing.     Comments: Decreased breath sound on the left side compared to right. Abdominal:     General: Bowel sounds are normal. There is no distension.     Palpations: Abdomen is soft. There is no mass.     Tenderness: There is no abdominal tenderness. There is no guarding or rebound.  Musculoskeletal:        General: No tenderness. Normal range of motion.     Cervical back: Normal range of motion and neck supple.  Skin:    General: Skin is warm.  Neurological:     Mental Status: He is alert and oriented to person, place, and time.  Psychiatric:        Mood and Affect: Affect normal.   Fungal dermatitis bilateral buttocks groin/underneath abdominal pannus  LABORATORY DATA:  I have reviewed the data as listed Lab Results  Component Value Date   WBC 9.0 07/28/2021   HGB 14.0 07/28/2021   HCT 42.9 07/28/2021   MCV 96.0 07/28/2021   PLT 305 07/28/2021   Recent Labs    06/30/21 1044 07/14/21 0811 07/28/21 0843  NA 135 132* 136  K 3.5 3.4* 3.8  CL 102 102 105  CO2 '25 22 26  ' GLUCOSE 129* 141* 103*  BUN '14 12 16  ' CREATININE 0.98 1.14 1.19  CALCIUM 8.4* 8.5* 8.3*  GFRNONAA >60 >60 >60  PROT 6.9 7.8 6.6  ALBUMIN 3.5 3.7 3.3*  AST '25 22 20  ' ALT 34 35 24  ALKPHOS 79 87 82  BILITOT 0.6 0.8 0.5    RADIOGRAPHIC STUDIES: I have personally reviewed the radiological images as listed and agreed with the findings in the report. DG Chest 2 View  Result Date: 07/14/2021 CLINICAL DATA:  40 year old male with history of cough, congestion and fever. Left lower lobe lung cancer. EXAM: CHEST - 2 VIEW COMPARISON:  Multiple priors, most recently chest x-ray 05/19/2021 in CT of the chest, abdomen and pelvis 06/27/2021. FINDINGS: Right internal jugular single-lumen porta cath with tip terminating in the superior cavoatrial junction. Chronic large left pleural effusion with areas of atelectasis and/or consolidation in  the left mid to lower lung, similar to the prior examination. Diffuse peribronchial cuffing, increased compared to the prior study right lung is otherwise clear. No right pleural effusion. No pneumothorax. Heart size appears normal. Upper mediastinal contours are unremarkable. IMPRESSION: 1. Increasing diffuse peribronchial cuffing compared to the prior  study, which may suggest an acute bronchitis. 2. Radiographic appearance of the chest is otherwise stable, as above. Electronically Signed   By: Vinnie Langton M.D.   On: 07/14/2021 09:13    ASSESSMENT & PLAN:   Cancer of lower lobe of left lung ([place]) # STAGE IV- Left upper lobe lung cancer-adenocarcinoma the lung;  EGFR- 19 del POSITIVE. 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  Currently on osimertinib 80 mg a day [osimertinib [since June 28th, 2022].  September 13th- CT scan chest and pelvis-decrease size of the left lower lobe mass/mediastinal adenopathy supraclavicular lymph nodes/omental/peritoneal nodularity.  Improvement of the contralateral lung nodules. Moderate left pleural effusion, decreased in size compared to prior exam.  #Skin rash on buttocks- likely sec to fungal dermatitis-  Recommend Lotrimin/generic ointment-twice a day on the affected area.  Keep the area dry/after shower with a towel.   #Multiple brain mets subcentimeter asymptomatic [NO RT]- . MRI brain AUG 16th-significant response noted. DEXAMETHASONE [stopped on 10/2.].  Will repeat MRI in 1 months  #Left lung PE [incidental on CT SEP 13th,2022-]?Symptomatic DVT of the right calf-on Eliquis pain improved- STABLE.  #Bone metastases-  September 13-CT scan shows "increasing sclerotic lesions"/likely secondary to healing process.  Dental evaluation on 9/21.  Continue calcium vitamin D intake. Currently on hold Zometa because of low calcium 8.3; ?  Osimertinib. HOLD zometa.   # Cardiac arrhythmia-SVT s/p adenosine post thoracentesis [June 2022]; Hx of  WPW-stable on metoprolol. STABLE.  # DISPOSITION: # HOLD zometa; de-access # follow up in 4 week-MD; labs- cbc/cmp; possible zometa;MRI brain- Dr.B                         All questions were answered. The patient knows to call the clinic with any problems, questions or concerns.    [name], MD 07/28/2021 10:16 AM

## 2021-08-13 ENCOUNTER — Encounter: Payer: Self-pay | Admitting: Internal Medicine

## 2021-08-14 ENCOUNTER — Encounter: Payer: Self-pay | Admitting: Internal Medicine

## 2021-08-14 ENCOUNTER — Encounter: Payer: Self-pay | Admitting: Nurse Practitioner

## 2021-08-14 NOTE — Telephone Encounter (Signed)
Pt returned call and agreed to smc visit on Wednesday if rash had not improved. He preferred to wait a couple days to see if it improves or goes away, but will call us to cancel if rash resolves.

## 2021-08-15 ENCOUNTER — Other Ambulatory Visit: Payer: Self-pay | Admitting: Internal Medicine

## 2021-08-15 DIAGNOSIS — C3492 Malignant neoplasm of unspecified part of left bronchus or lung: Secondary | ICD-10-CM

## 2021-08-16 ENCOUNTER — Other Ambulatory Visit: Payer: Self-pay

## 2021-08-16 ENCOUNTER — Inpatient Hospital Stay
Payer: Managed Care, Other (non HMO) | Attending: Hospice and Palliative Medicine | Admitting: Hospice and Palliative Medicine

## 2021-08-16 VITALS — BP 120/76 | HR 77 | Temp 98.6°F | Resp 18

## 2021-08-16 DIAGNOSIS — G720 Drug-induced myopathy: Secondary | ICD-10-CM | POA: Insufficient documentation

## 2021-08-16 DIAGNOSIS — Z452 Encounter for adjustment and management of vascular access device: Secondary | ICD-10-CM | POA: Insufficient documentation

## 2021-08-16 DIAGNOSIS — C3432 Malignant neoplasm of lower lobe, left bronchus or lung: Secondary | ICD-10-CM | POA: Diagnosis not present

## 2021-08-16 DIAGNOSIS — R21 Rash and other nonspecific skin eruption: Secondary | ICD-10-CM | POA: Insufficient documentation

## 2021-08-16 DIAGNOSIS — C7951 Secondary malignant neoplasm of bone: Secondary | ICD-10-CM | POA: Insufficient documentation

## 2021-08-16 DIAGNOSIS — C7931 Secondary malignant neoplasm of brain: Secondary | ICD-10-CM | POA: Insufficient documentation

## 2021-08-16 MED ORDER — TRIAMCINOLONE ACETONIDE 0.5 % EX OINT: 1.0000 "application " | TOPICAL_OINTMENT | Freq: Two times a day (BID) | CUTANEOUS | 0 refills | Status: DC

## 2021-08-16 NOTE — Progress Notes (Signed)
Symptom Management [place]  [phone] [phone]  Patient Care Team: Pcp, No as PCP - General [name], RN as Oncology Nurse Navigator   Name of the patient: [name]  [id number]  1981-01-25   Date of visit: 08/16/21  Reason for Consult: [name] is a 40 y.o. male with multiple medical problems including including WPW syndrome and stage IV non-small cell lung cancer widely metastatic to bone/brain/omental and peritoneal carcinomatosis/malignant pleural effusion on treatment with osimertinib.  Patient saw Nathan Abide, NP, on 07/13/2021 for viral illness treated symptomatically.  Patient last saw Dr. Rogue [name] on 07/28/2021 at which time he had a skin rash to buttocks it was felt likely with fungal dermatitis.  Lotrimin was recommended.  Patient presents to the [facility] today for evaluation and management of a rash.  He describes a pruritic erythematous rash to his chest and back developing last week.  Of note, he recently weaned off dexamethasone and says that he has felt somewhat fatigued after discontinuing steroids.  He denies fever or chills.  No viral syndromes.  No nausea or vomiting.  He has occasional GERD.  Patient has been using OTC hydrocortisone and reports some improvement in the rash over the past several days.  He is not currently on an antihistamine.  Denies any neurologic complaints. Denies recent fevers or illnesses. Denies any easy bleeding or bruising. Reports adequate appetite without weight loss. Denies chest pain. Denies any nausea, vomiting or diarrhea.  Denies urinary complaints. Patient offers no further specific complaints today.  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    PAST SURGICAL HISTORY:  Past Surgical History:  Procedure Laterality Date   FRACTURE  SURGERY     broke femur when he was 6 years   PORTA CATH INSERTION N/A 03/28/2021   Procedure: PORTA CATH INSERTION;  Surgeon: Katha Cabal, MD;  Location: [facility];  Service: Cardiovascular;  Laterality: N/A;   VIDEO BRONCHOSCOPY WITH ENDOBRONCHIAL NAVIGATION N/A 03/15/2021   Procedure: VIDEO BRONCHOSCOPY WITH ENDOBRONCHIAL NAVIGATION;  Surgeon: Ottie Glazier, MD;  Location: [facility];  Service: Thoracic;  Laterality: N/A;   VIDEO BRONCHOSCOPY WITH ENDOBRONCHIAL ULTRASOUND N/A 03/15/2021   Procedure: VIDEO BRONCHOSCOPY WITH ENDOBRONCHIAL ULTRASOUND;  Surgeon: Ottie Glazier, MD;  Location: [facility];  Service: Thoracic;  Laterality: N/A;    HEMATOLOGY/ONCOLOGY HISTORY:  Oncology History Overview Note  IMPRESSION: 1. Patchy nodular fat stranding throughout the anterior left upper quadrant peritoneal fat, nonspecific, cannot exclude peritoneal carcinomatosis. Dedicated CT abdomen/pelvis with oral and IV contrast recommended for further evaluation. 2. Dense patchy consolidation replacing much of the left lower lung lobe, appearing masslike in the superior segment left lower lobe, with associated bulging of the left major fissure and associated left lower lobe volume loss. Fine nodularity throughout both lungs with an upper lobe predominance. Asymmetric left upper lobe interlobular septal thickening. These findings are indeterminate, with differential including multilobar pneumonia, sarcoidosis or a neoplastic process. The persistence on radiographs back to 01/20/2021 despite antibiotic therapy make sarcoidosis or a neoplastic process more likely. Pulmonology consultation suggested for consideration of bronchoscopic evaluation. 3. Small dependent left pleural effusion. 4. Mild mediastinal lymphadenopathy, nonspecific. 5. Subacute healing lateral right sixth rib fracture.   DIAGNOSIS:  A. LUNG, LEFT LOWER LOBE; ENB-ASSISTED BIOPSY:  - NON-SMALL CELL CARCINOMA,  FAVOR  ADENOCARCINOMA.  - FOREIGN MATERIAL SUGGESTIVE OF ASPIRATION.   # EGFR MUTATED: 108 del- June 28th, 2022- [name].    Cancer of lower lobe of left lung ([place])  03/23/2021 Initial Diagnosis   Cancer of lower lobe of left lung ([place])   03/29/2021 Cancer Staging   Staging form: Lung, AJCC 8th Edition - Clinical: Stage IVB (cT3, cN3, pM1c) - Signed by [name], MD on 03/29/2021   03/30/2021 -  Chemotherapy    Patient is on Treatment Plan: LUNG NSCLC PEMETREXED (ALIMTA) / CARBOPLATIN Q21D X 1 CYCLES        ALLERGIES:  is allergic to codeine.  MEDICATIONS:  Current Outpatient Medications  Medication Sig Dispense Refill   apixaban (ELIQUIS) 5 MG TABS tablet Take 1 tablet (41m) twice daily 60 tablet 2   clindamycin (CLINDAGEL) 1 % gel Apply topically 2 (two) times daily. Apply to affected areas twice a day. 30 g 0   dexamethasone (DECADRON) 1 MG tablet Take 1 tablet (1 mg total) by mouth 2 (two) times daily with a meal. (Patient not taking: Reported on 07/28/2021) 60 tablet 1   folic acid (FOLVITE) 1 MG tablet Take 1 tablet (1 mg total) by mouth daily. 90 tablet 1   lidocaine-prilocaine (EMLA) cream Apply 1 application topically as needed. 30 g 0   metoprolol succinate (TOPROL XL) 25 MG 24 hr tablet Take 1 tablet (25 mg total) by mouth daily. (Patient taking differently: Take 50 mg by mouth daily.) 30 tablet 1   ondansetron (ZOFRAN) 8 MG tablet One pill every 8 hours as needed for nausea/vomitting. (Patient not taking: No sig reported) 40 tablet 1   prochlorperazine (COMPAZINE) 10 MG tablet Take 1 tablet (10 mg total) by mouth every 6 (six) hours as needed for nausea or vomiting. (Patient not taking: No sig reported) 40 tablet 1   sertraline (ZOLOFT) 50 MG tablet Take 0.5 tablets (25 mg total) by mouth daily. 30 tablet 2   TAGRISSO 80 MG tablet TAKE 1 TABLET (80 MG TOTAL) DAILY 30 tablet 4   No current facility-administered medications for this visit.   Facility-Administered  Medications Ordered in Other Visits  Medication Dose Route Frequency Provider Last Rate Last Admin   heparin lock flush 100 UNIT/ML injection            sodium chloride flush (NS) 0.9 % injection 10 mL  10 mL Intravenous PRN BCammie Sickle MD   10 mL at 07/28/21 0852    VITAL SIGNS: There were no vitals taken for this visit. There were no vitals filed for this visit.  Estimated body mass index is 33.66 kg/m as calculated from the following:   Height as of 06/02/21: '5\' 10"'  (1.778 m).   Weight as of 07/28/21: 234 lb 9.6 oz (106.4 kg).  LABS: CBC:    Component Value Date/Time   WBC 9.0 07/28/2021 0843   HGB 14.0 07/28/2021 0843   HCT 42.9 07/28/2021 0843   PLT 305 07/28/2021 0843   MCV 96.0 07/28/2021 0843   NEUTROABS 5.0 07/28/2021 0843   LYMPHSABS 2.2 07/28/2021 0843   MONOABS 1.2 (H) 07/28/2021 0843   EOSABS 0.2 07/28/2021 0843   BASOSABS 0.1 07/28/2021 0843   Comprehensive Metabolic Panel:    Component Value Date/Time   NA 136 07/28/2021 0843   K 3.8 07/28/2021 0843   CL 105 07/28/2021 0843   CO2 26 07/28/2021 0843   BUN 16 07/28/2021 0843   CREATININE 1.19 07/28/2021 0843  GLUCOSE 103 (H) 07/28/2021 0843   CALCIUM 8.3 (L) 07/28/2021 0843   AST 20 07/28/2021 0843   ALT 24 07/28/2021 0843   ALKPHOS 82 07/28/2021 0843   BILITOT 0.5 07/28/2021 0843   PROT 6.6 07/28/2021 0843   ALBUMIN 3.3 (L) 07/28/2021 0843    RADIOGRAPHIC STUDIES: No results found.  PERFORMANCE STATUS (ECOG) : 1 - Symptomatic but completely ambulatory  Review of Systems Unless otherwise noted, a complete review of systems is negative.  Physical Exam General: NAD Pulmonary: Unlabored Extremities: no edema, no joint deformities Skin: Maculopapular rash to back and chest Neurological: Weakness but otherwise nonfocal     Assessment and Plan- Patient is a 40 y.o. male with stage IV non-small cell lung cancer widely metastatic to bone/brain/omental and peritoneal  carcinomatosis/malignant pleural effusion on treatment with Tagrisso who presents to [facility] for evaluation of rash  Rash-suspect that this is due to [place].  Discussed with Dr. Rogue [name] who originally recommended that patient hold the [place] but patient was not comfortable with this plan.  Patient would prefer to continue the [place] for now.  We will start him on Kenalog cream.  Recommended daily antihistamine with loratadine.  Case and plan discussed with Dr. Rogue [name].  Patient to follow-up with Dr. Rogue [name] on 11/11   Patient expressed understanding and was in agreement with this plan. He also understands that He can call clinic at any time with any questions, concerns, or complaints.   Thank you for allowing me to participate in the care of this very pleasant patient.   Time Total: 15 minutes  Visit consisted of counseling and education dealing with the complex and emotionally intense issues of symptom management and palliative care in the setting of serious and potentially life-threatening illness.Greater than 50%  of this time was spent counseling and coordinating care related to the above assessment and plan.  Signed by: Altha Harm, PhD, NP-C

## 2021-08-16 NOTE — Progress Notes (Signed)
Pt presents to [facility] for rash since last Friday. Also reports excessive tiredness since coming off steroids. Diffuse maculopapular rash noted over back and upper chest. States that does itch. Has tried benadryl and cortisone cream.

## 2021-08-21 ENCOUNTER — Ambulatory Visit
Admission: RE | Admit: 2021-08-21 | Discharge: 2021-08-21 | Disposition: A | Discharge status: Home / Self Care | Payer: Managed Care, Other (non HMO) | Source: Ambulatory Visit | Attending: Internal Medicine | Admitting: Internal Medicine

## 2021-08-21 DIAGNOSIS — C7931 Secondary malignant neoplasm of brain: Secondary | ICD-10-CM | POA: Diagnosis present

## 2021-08-21 IMAGING — MR MR HEAD WO/W CM
16 series · 48 of 48 positions shown · IV contrast (10ml Gadavist)
Comparison: MRI of the brain [DATE].

CLINICAL DATA: Brain metastasis; brain/CNS neoplasm, staging.

EXAM:
MRI HEAD WITHOUT AND WITH CONTRAST
TECHNIQUE: Multiplanar, multiecho pulse sequences of the brain and surrounding
structures were obtained without and with intravenous contrast.
CONTRAST:  10mL GADAVIST GADOBUTROL 1 MMOL/ML IV SOLN

[Series 5: ax dwi_tracew · axial · 3.0mm · 0.65mm/px · z∈[-30,+120]mm · 3 of 48 slices shown]
[im 1/48]
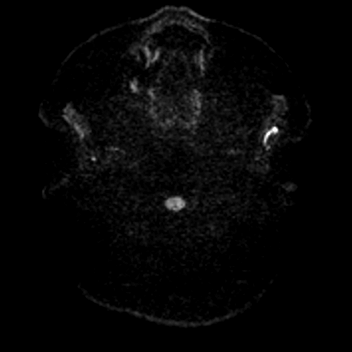
[im 24/48]
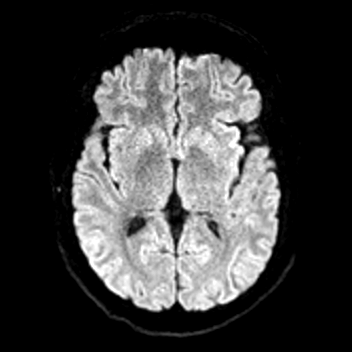
[im 48/48]
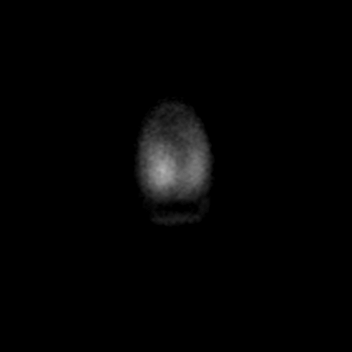

[Series 6: ax dwi_adc · axial · 3.0mm · 0.65mm/px · z∈[-30,+120]mm · 3 of 46 slices shown]
[im 1/46]
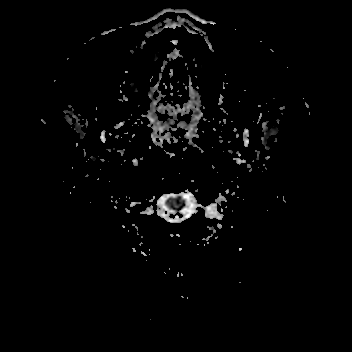
[im 23/46]
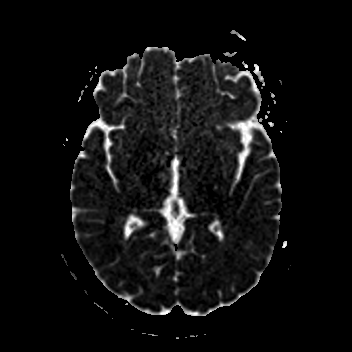
[im 46/46]
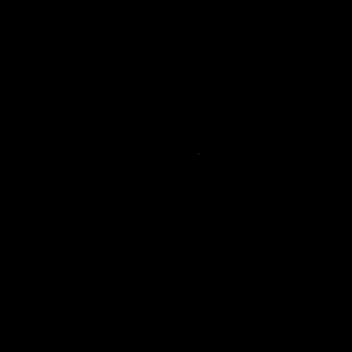

[Series 7: cor dwi_tracew · coronal · 5.0mm · 1.80mm/px · 2 of 38 slices shown]
[im 1/38]
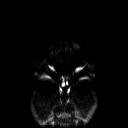
[im 38/38]
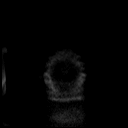

[Series 8: cor dwi_adc · coronal · 5.0mm · 1.80mm/px · 2 of 38 slices shown]
[im 1/38]
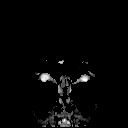
[im 38/38]
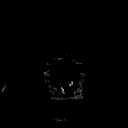

[Series 9: T1 · sagittal · 5.0mm · 0.62mm/px · 1 of 25 slices shown (1 of 2)]
[im 1/25]
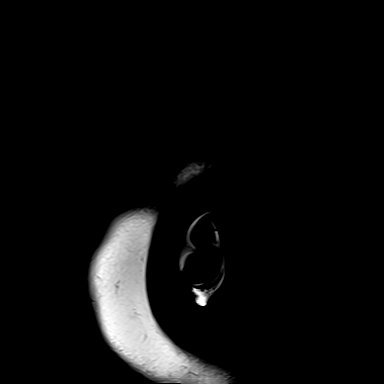

[Series 10: T2 · axial · 5.0mm · 0.53mm/px · 1 of 25 slices shown]
[im 1/25]
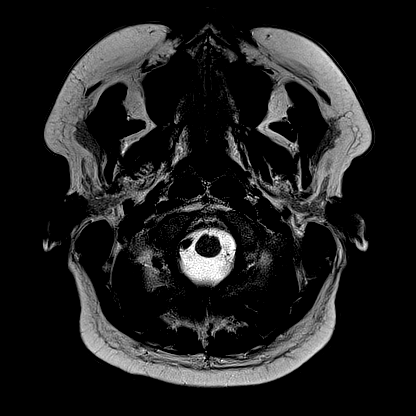

[Series 11: mag_images · axial · 3.0mm · 0.90mm/px · z∈[-37,+134]mm · 3 of 60 slices shown]
[im 1/60]
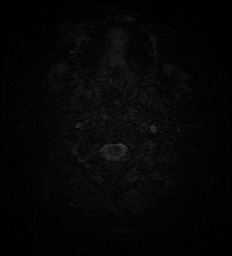
[im 30/60]
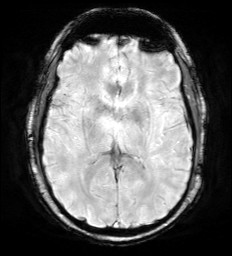
[im 60/60]
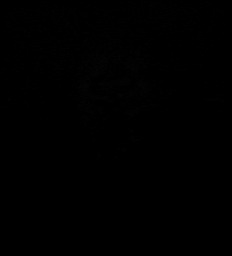

[Series 12: pha_images · axial · 3.0mm · 0.90mm/px · z∈[-37,+131]mm · 3 of 59 slices shown]
[im 1/59]
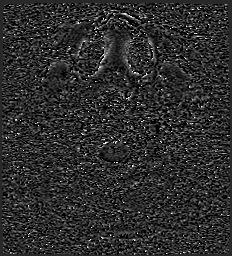
[im 30/59]
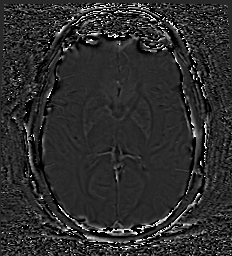
[im 59/59]
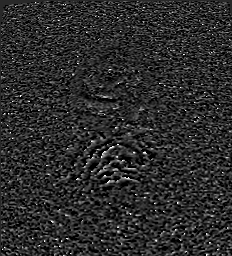

[Series 13: swi_images · axial · 3.0mm · 0.90mm/px · z∈[-37,+134]mm · 3 of 60 slices shown]
[im 1/60]
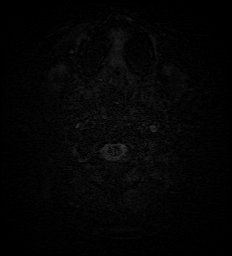
[im 30/60]
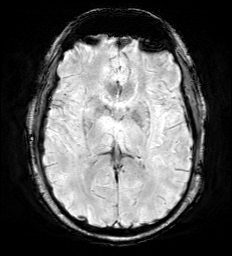
[im 60/60]
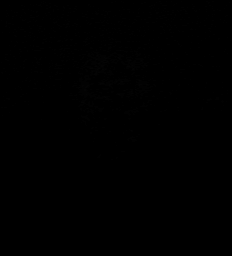

[Series 14: mip_images(sw) · axial · 24.0mm · 0.90mm/px · z∈[-27,+124]mm · 3 of 53 slices shown]
[im 1/53]
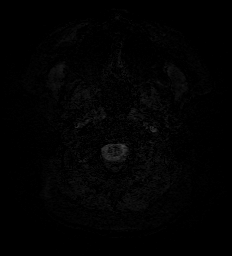
[im 27/53]
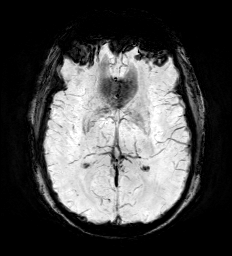
[im 53/53]
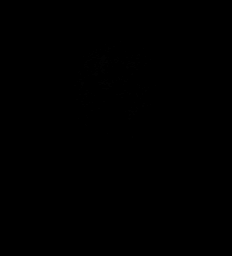

[Series 15: FLAIR · axial · 3.0mm · 0.53mm/px · z∈[-29,+128]mm · 3 of 55 slices shown]
[im 1/55]
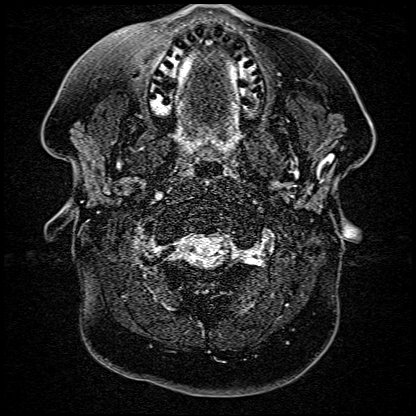
[im 28/55]
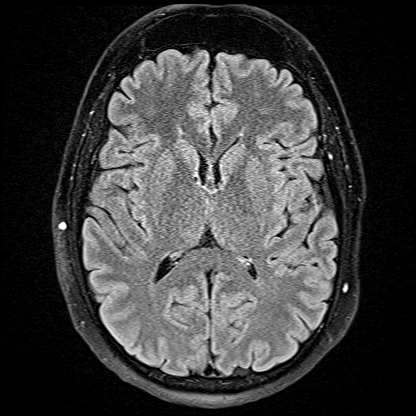
[im 55/55]
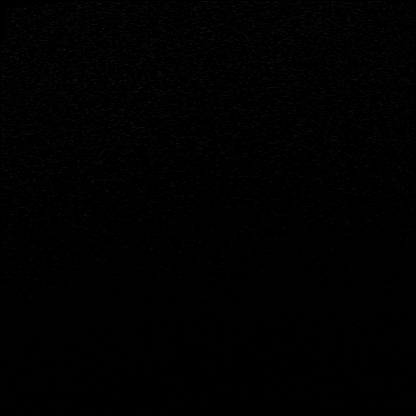

[Series 16: T1 · axial · 1.0mm · 0.98mm/px · z∈[-26,+146]mm · 9 of 173 slices shown (2 of 2)]
[im 1/173]
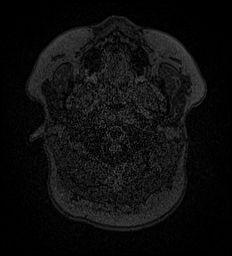
[im 22/173]
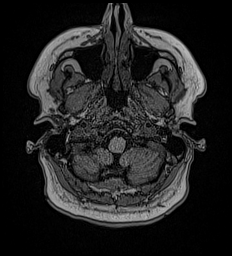
[im 44/173]
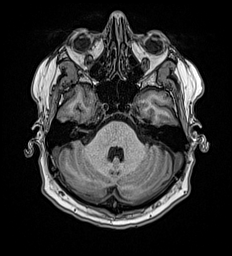
[im 65/173]
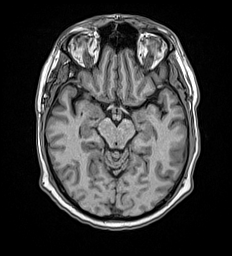
[im 87/173]
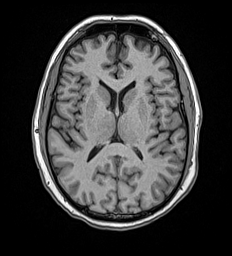
[im 108/173]
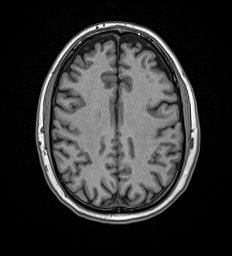
[im 130/173]
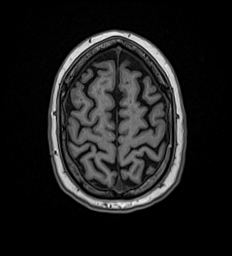
[im 151/173]
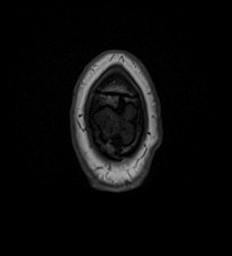
[im 173/173]
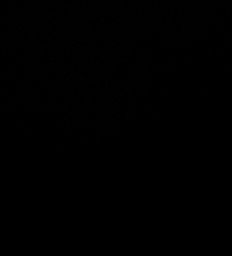

[Series 17: T2 post-contrast · coronal · 5.0mm · 0.57mm/px · 1 of 29 slices shown]
[im 1/29]
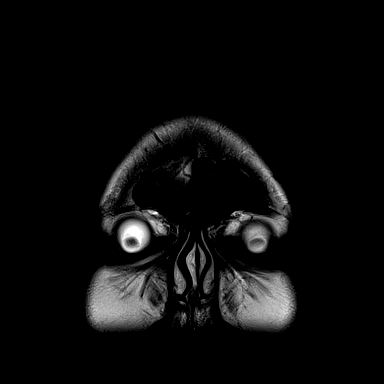

[Series 18: T1 post-contrast · axial · 1.0mm · 0.98mm/px · z∈[-26,+144]mm · 9 of 168 slices shown (1 of 3)]
[im 1/168]
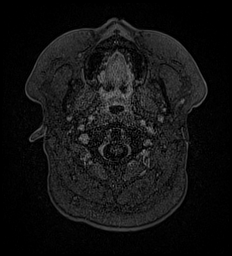
[im 21/168]
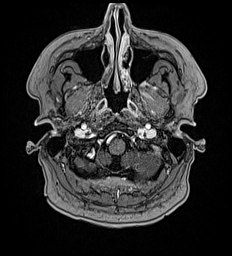
[im 42/168]
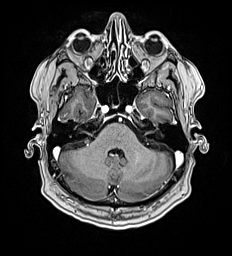
[im 63/168]
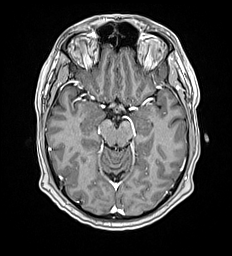
[im 84/168]
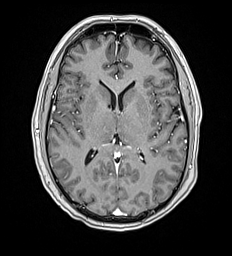
[im 105/168]
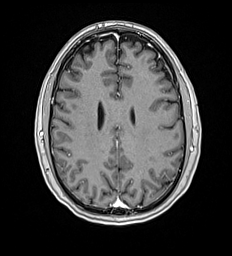
[im 126/168]
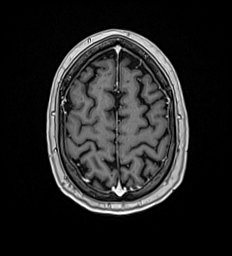
[im 147/168]
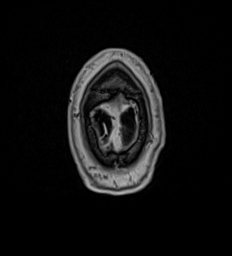
[im 168/168]
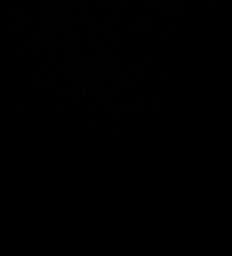

[Series 19: T1 post-contrast · coronal · 5.0mm · 0.57mm/px · 1 of 29 slices shown (2 of 3)]
[im 1/29]
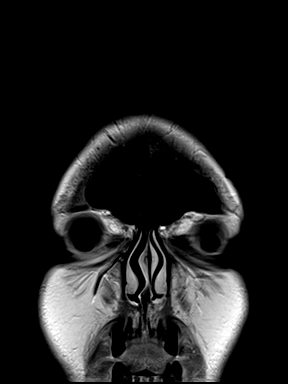

[Series 20: T1 post-contrast · sagittal · 5.0mm · 0.62mm/px · 1 of 24 slices shown (3 of 3)]
[im 1/24]
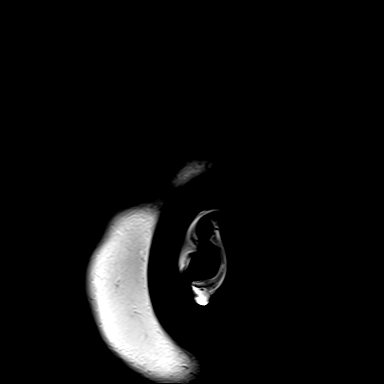

[48 of 48 positions shown; findings below may reference images not displayed]

FINDINGS: Brain: No acute infarction, hemorrhage, hydrocephalus or extra-axial
collection.

Multiple enhancing lesions are again seen in the cerebral
hemispheres, as described below:

* Right parietal lobe, 3 mm now, 4 mm on prior, series 18, image
117.

* Right frontal lobe, 2 mm, unchanged, series 18, image 122.

* Posterior left frontal lobe, 4 mm, unchanged, series 18, image
109.

* Right posterior parietal lobe, 4 mm, unchanged, series 18, image
97.

* Posterior left frontal lobe, 3 mm now, 2 mm on prior, series 18,
image 113.

* Right frontal lobe, 2 mm now, not seen on last prior study but
present on MRI performed [DATE].

No significant mass effect or edema.

Vascular: Normal flow voids.

Skull and upper cervical spine: Normal marrow signal.

Sinuses/Orbits: Negative.

Other: None.
IMPRESSION: Interval mixed treatment response with decreasing size of 1 lesion,
unchanged size of 3 lesions and increased size of 2 lesions, as
described above.

## 2021-08-21 MED ORDER — GADOBUTROL 1 MMOL/ML IV SOLN
10.0000 mL | Freq: Once | INTRAVENOUS | Status: AC | PRN
  Administered 2021-08-21: 10 mL via INTRAVENOUS

## 2021-08-22 ENCOUNTER — Telehealth: Payer: Self-pay | Admitting: Internal Medicine

## 2021-08-22 NOTE — Telephone Encounter (Signed)
Spoke to regarding the mixed results of the MRI brain; will discuss further at next visit.  We will also check with radiation oncology.   GB

## 2021-08-25 ENCOUNTER — Inpatient Hospital Stay: Payer: Managed Care, Other (non HMO)

## 2021-08-25 ENCOUNTER — Inpatient Hospital Stay (HOSPITAL_BASED_OUTPATIENT_CLINIC_OR_DEPARTMENT_OTHER): Payer: Managed Care, Other (non HMO) | Admitting: Internal Medicine

## 2021-08-25 ENCOUNTER — Encounter: Payer: Self-pay | Admitting: Internal Medicine

## 2021-08-25 ENCOUNTER — Other Ambulatory Visit: Payer: Self-pay

## 2021-08-25 DIAGNOSIS — C7951 Secondary malignant neoplasm of bone: Secondary | ICD-10-CM | POA: Diagnosis not present

## 2021-08-25 DIAGNOSIS — R21 Rash and other nonspecific skin eruption: Secondary | ICD-10-CM | POA: Diagnosis not present

## 2021-08-25 DIAGNOSIS — C7931 Secondary malignant neoplasm of brain: Secondary | ICD-10-CM | POA: Diagnosis not present

## 2021-08-25 DIAGNOSIS — G720 Drug-induced myopathy: Secondary | ICD-10-CM | POA: Diagnosis not present

## 2021-08-25 DIAGNOSIS — C3432 Malignant neoplasm of lower lobe, left bronchus or lung: Secondary | ICD-10-CM | POA: Diagnosis present

## 2021-08-25 DIAGNOSIS — Z452 Encounter for adjustment and management of vascular access device: Secondary | ICD-10-CM | POA: Diagnosis not present

## 2021-08-25 DIAGNOSIS — C3492 Malignant neoplasm of unspecified part of left bronchus or lung: Secondary | ICD-10-CM

## 2021-08-25 LAB — CBC WITH DIFFERENTIAL/PLATELET
Abs Immature Granulocytes: 0.05 10*3/uL (ref 0.00–0.07)
Basophils Absolute: 0.1 10*3/uL (ref 0.0–0.1)
Basophils Relative: 1 %
Eosinophils Absolute: 0.1 10*3/uL (ref 0.0–0.5)
Eosinophils Relative: 2 %
HCT: 40.1 % (ref 39.0–52.0)
Hemoglobin: 13.4 g/dL (ref 13.0–17.0)
Immature Granulocytes: 1 %
Lymphocytes Relative: 22 %
Lymphs Abs: 1.6 10*3/uL (ref 0.7–4.0)
MCH: 31 pg (ref 26.0–34.0)
MCHC: 33.4 g/dL (ref 30.0–36.0)
MCV: 92.8 fL (ref 80.0–100.0)
Monocytes Absolute: 0.7 10*3/uL (ref 0.1–1.0)
Monocytes Relative: 10 %
Neutro Abs: 4.5 10*3/uL (ref 1.7–7.7)
Neutrophils Relative %: 64 %
Platelets: 275 10*3/uL (ref 150–400)
RBC: 4.32 MIL/uL (ref 4.22–5.81)
RDW: 12.7 % (ref 11.5–15.5)
WBC: 7 10*3/uL (ref 4.0–10.5)
nRBC: 0 % (ref 0.0–0.2)

## 2021-08-25 LAB — COMPREHENSIVE METABOLIC PANEL
ALT: 22 U/L (ref 0–44)
AST: 29 U/L (ref 15–41)
Albumin: 3.4 g/dL — ABNORMAL LOW (ref 3.5–5.0)
Alkaline Phosphatase: 91 U/L (ref 38–126)
Anion gap: 9 (ref 5–15)
BUN: 9 mg/dL (ref 6–20)
CO2: 24 mmol/L (ref 22–32)
Calcium: 8.4 mg/dL — ABNORMAL LOW (ref 8.9–10.3)
Chloride: 104 mmol/L (ref 98–111)
Creatinine, Ser: 1.18 mg/dL (ref 0.61–1.24)
GFR, Estimated: >60 mL/min (ref >60)
Glucose, Bld: 133 mg/dL — ABNORMAL HIGH (ref 70–99)
Potassium: 3.7 mmol/L (ref 3.5–5.1)
Sodium: 137 mmol/L (ref 135–145)
Total Bilirubin: 0.4 mg/dL (ref 0.3–1.2)
Total Protein: 6.8 g/dL (ref 6.5–8.1)

## 2021-08-25 MED ORDER — SODIUM CHLORIDE 0.9% FLUSH
10.0000 mL | INTRAVENOUS | Status: DC | PRN
  Administered 2021-08-25: 10 mL via INTRAVENOUS
  Filled 2021-08-25: qty 10

## 2021-08-25 MED ORDER — HEPARIN SOD (PORK) LOCK FLUSH 100 UNIT/ML IV SOLN
500.0000 [IU] | Freq: Once | INTRAVENOUS | Status: AC
Start: 2021-08-25 — End: 2021-08-25
  Administered 2021-08-25: 500 [IU] via INTRAVENOUS
  Filled 2021-08-25: qty 5

## 2021-08-25 NOTE — Assessment & Plan Note (Addendum)
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  Currently on osimertinib 80 mg a day [osimertinib [since June 28th, 2022].  September 13th- CT scan chest and pelvis-decrease size of the left lower lobe mass/mediastinal adenopathy supraclavicular lymph nodes/omental/peritoneal nodularity.  Improvement of the contralateral lung nodules. Moderate left pleural effusion, decreased in size compared to prior exam.  We will repeat a CT scan in 1 month.  #Skin rash on the torso-likely secondary to osimertinib.  Improved with Kenalog ointment.  # Multiple brain mets subcentimeter asymptomatic [NO RT]-NOV 8th, 2022- Interval mixed treatment response with decreasing size of 1 lesion, unchanged size of 3 lesions and increased size of 2 lesions. I discussed with radiation oncology.-Given the subcentimeter size asymptomatic nature I think is reasonable to continue monitoring.  We will plan to get a scan again in January 2023.  Again discussed the pros and cons of pulmonary radiation; and other modalities of radiation like SBRT in detail.  #Left lung PE [incidental on CT SEP 13th,2022-]?Symptomatic DVT of the right calf-on Eliquis pain improved-stable  #Bone metastases-  September 13-CT scan shows "increasing sclerotic lesions"/likely secondary to healing process.  Dental evaluation on 9/21.  Continue calcium vitamin D intake. Currently on hold Zometa because of low calcium 8.3; ?  Osimertinib. HOLD zometa.   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 STABLE.  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  Recommend evaluation with Gwenette Greet occupational therapist.  # DISPOSITION: # refer Gwenette Greet re: steroid myopathy r # HOLD zometa; de-access # follow up in 4 week-MD; labs- cbc/cmp; possible zometa; CT [facility] - Dr.B    # I reviewed the  blood work- with the patient in detail; also reviewed the imaging independently [as summarized above]; and with the patient in detail.

## 2021-08-25 NOTE — Progress Notes (Signed)
[facility] CONSULT NOTE  Patient Care Team: Pcp, No as PCP - General [name], RN as Oncology Nurse Navigator  CHIEF COMPLAINTS/PURPOSE OF CONSULTATION: Lung cancer  #  Oncology History Overview Note  IMPRESSION: 1. Patchy nodular fat stranding throughout the anterior left upper quadrant peritoneal fat, nonspecific, cannot exclude peritoneal carcinomatosis. Dedicated CT abdomen/pelvis with oral and IV contrast recommended for further evaluation. 2. Dense patchy consolidation replacing much of the left lower lung lobe, appearing masslike in the superior segment left lower lobe, with associated bulging of the left major fissure and associated left lower lobe volume loss. Fine nodularity throughout both lungs with an upper lobe predominance. Asymmetric left upper lobe interlobular septal thickening. These findings are indeterminate, with differential including multilobar pneumonia, sarcoidosis or a neoplastic process. The persistence on radiographs back to 01/20/2021 despite antibiotic therapy make sarcoidosis or a neoplastic process more likely. Pulmonology consultation suggested for consideration of bronchoscopic evaluation. 3. Small dependent left pleural effusion. 4. Mild mediastinal lymphadenopathy, nonspecific. 5. Subacute healing lateral right sixth rib fracture.   DIAGNOSIS:  A. LUNG, LEFT LOWER LOBE; ENB-ASSISTED BIOPSY:  - NON-SMALL CELL CARCINOMA, FAVOR ADENOCARCINOMA.  - FOREIGN MATERIAL SUGGESTIVE OF ASPIRATION.   # EGFR MUTATED: 25 del- June 28th, 2022- [name].    Cancer of lower lobe of left lung ([place])  03/23/2021 Initial Diagnosis   Cancer of lower lobe of left lung ([place])   03/29/2021 Cancer Staging   Staging form: Lung, AJCC 8th Edition - Clinical: Stage IVB (cT3, cN3, pM1c) - Signed by [name], MD on 03/29/2021    03/30/2021 -  Chemotherapy    Patient is on Treatment Plan: LUNG NSCLC PEMETREXED (ALIMTA) /  CARBOPLATIN Q21D X 1 CYCLES          HISTORY OF PRESENTING ILLNESS: Ambulating independently.  With his wife  [name] 40 y.o.  male patient with stage IV lung cancer adenocarcinoma  EGFR mutated on osimertinib; metastatic to brain [no WBRT] is here for follow-up/review results of the MRI brain.  In the interim patient was evaluated by symptom management clinic for diffuse rash on the torso.  Improved with Kenalog ointment.  Complains of fatigue.  Also complains of difficulty getting up and moving around especially from squatting position.  Chronic mild shortness of breath or any worse..  Review of Systems  Constitutional:  Positive for malaise/fatigue. Negative for chills, diaphoresis and fever.  HENT:  Negative for nosebleeds and sore throat.   Eyes:  Negative for double vision.  Respiratory:  Positive for shortness of breath. Negative for wheezing.   Cardiovascular:  Negative for chest pain, palpitations, orthopnea and leg swelling.  Gastrointestinal:  Negative for abdominal pain, blood in stool, diarrhea, heartburn, melena, nausea and vomiting.  Genitourinary:  Negative for dysuria, frequency and urgency.  Musculoskeletal:  Negative for back pain and joint pain.  Skin:  Negative for itching.  Neurological:  Positive for weakness. Negative for dizziness, tingling, focal weakness and headaches.  Endo/Heme/Allergies:  Does not bruise/bleed easily.  Psychiatric/Behavioral:  Negative for depression. The patient is not nervous/anxious and does not have insomnia.     MEDICAL HISTORY:  Past Medical History:  Diagnosis Date   Cancer ([place])    Dyspnea    Heartburn    Pneumonia    Wolff-Parkinson-White (WPW) 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    SURGICAL HISTORY: Past Surgical History:  Procedure Laterality Date   FRACTURE SURGERY     broke femur when he  was 6 years   PORTA CATH INSERTION N/A 03/28/2021   Procedure: PORTA CATH INSERTION;  Surgeon: Katha Cabal, MD;   Location: [facility];  Service: Cardiovascular;  Laterality: N/A;   VIDEO BRONCHOSCOPY WITH ENDOBRONCHIAL NAVIGATION N/A 03/15/2021   Procedure: VIDEO BRONCHOSCOPY WITH ENDOBRONCHIAL NAVIGATION;  Surgeon: Ottie Glazier, MD;  Location: [facility];  Service: Thoracic;  Laterality: N/A;   VIDEO BRONCHOSCOPY WITH ENDOBRONCHIAL ULTRASOUND N/A 03/15/2021   Procedure: VIDEO BRONCHOSCOPY WITH ENDOBRONCHIAL ULTRASOUND;  Surgeon: Ottie Glazier, MD;  Location: [facility];  Service: Thoracic;  Laterality: N/A;    SOCIAL HISTORY: Social History   Socioeconomic History   Marital status: Married    Spouse name: [name]   Number of children: 2   Years of education: Not on file   Highest education level: Not on file  Occupational History   Not on file  Tobacco Use   Smoking status: Never   Smokeless tobacco: Current    Types: Snuff, Chew  Vaping Use   Vaping Use: Never used  Substance and Sexual Activity   Alcohol use: Never   Drug use: Never   Sexual activity: Not on file  Other Topics Concern   Not on file  Social History Narrative   [place]; with wife; 2 daughters[12 and 22]; never smoked; rare alcohol. Work in [employer]    Social Determinants of Health   Financial Resource Strain: Not on file  Food Insecurity: Not on file  Transportation Needs: Not on file  Physical Activity: Not on file  Stress: Not on file  Social Connections: Not on file  Intimate Partner Violence: Not on file    FAMILY HISTORY: Family History  Problem Relation Age of Onset   High Cholesterol Mother    Hypertension Father    Cancer Father    Lung cancer Father    Skin cancer Father     ALLERGIES:  is allergic to codeine.  MEDICATIONS:  Current Outpatient Medications  Medication Sig Dispense Refill   apixaban (ELIQUIS) 5 MG TABS tablet Take 1 tablet (28m) twice daily 60 tablet 2   clindamycin (CLINDAGEL) 1 % gel Apply topically 2 (two) times daily. Apply to affected areas twice a day.  30 g 0   folic acid (FOLVITE) 1 MG tablet Take 1 tablet (1 mg total) by mouth daily. 90 tablet 1   lidocaine-prilocaine (EMLA) cream Apply 1 application topically as needed. 30 g 0   metoprolol succinate (TOPROL XL) 25 MG 24 hr tablet Take 1 tablet (25 mg total) by mouth daily. (Patient taking differently: Take 50 mg by mouth daily.) 30 tablet 1   sertraline (ZOLOFT) 50 MG tablet Take 0.5 tablets (25 mg total) by mouth daily. 30 tablet 2   TAGRISSO 80 MG tablet TAKE 1 TABLET (80 MG TOTAL) DAILY 30 tablet 4   triamcinolone ointment (KENALOG) 0.5 % Apply 1 application topically 2 (two) times daily. 30 g 0   dexamethasone (DECADRON) 1 MG tablet Take 1 tablet (1 mg total) by mouth 2 (two) times daily with a meal. (Patient not taking: No sig reported) 60 tablet 1   ondansetron (ZOFRAN) 8 MG tablet One pill every 8 hours as needed for nausea/vomitting. (Patient not taking: No sig reported) 40 tablet 1   prochlorperazine (COMPAZINE) 10 MG tablet Take 1 tablet (10 mg total) by mouth every 6 (six) hours as needed for nausea or vomiting. (Patient not taking: No sig reported) 40 tablet 1   No current  facility-administered medications for this visit.   Facility-Administered Medications Ordered in Other Visits  Medication Dose Route Frequency Provider Last Rate Last Admin   heparin lock flush 100 UNIT/ML injection            sodium chloride flush (NS) 0.9 % injection 10 mL  10 mL Intravenous PRN [name], MD   10 mL at 07/28/21 0852      .  PHYSICAL EXAMINATION: ECOG PERFORMANCE STATUS: 1 - Symptomatic but completely ambulatory  Vitals:   08/25/21 0918  BP: 117/81  Pulse: 71  Resp: 20  Temp: 97.8 F (36.6 C)  SpO2: 97%   Filed Weights   08/25/21 0918  Weight: 233 lb (105.7 kg)     Physical Exam HENT:     Head: Normocephalic and atraumatic.     Mouth/Throat:     Pharynx: No oropharyngeal exudate.  Eyes:     Pupils: Pupils are equal, round, and reactive to light.   Cardiovascular:     Rate and Rhythm: Normal rate and regular rhythm.  Pulmonary:     Effort: No respiratory distress.     Breath sounds: No wheezing.     Comments: Decreased breath sound on the left side compared to right. Abdominal:     General: Bowel sounds are normal. There is no distension.     Palpations: Abdomen is soft. There is no mass.     Tenderness: There is no abdominal tenderness. There is no guarding or rebound.  Musculoskeletal:        General: No tenderness. Normal range of motion.     Cervical back: Normal range of motion and neck supple.  Skin:    General: Skin is warm.  Neurological:     Mental Status: He is alert and oriented to person, place, and time.  Psychiatric:        Mood and Affect: Affect normal.   Fungal dermatitis bilateral buttocks groin/underneath abdominal pannus  LABORATORY DATA:  I have reviewed the data as listed Lab Results  Component Value Date   WBC 7.0 08/25/2021   HGB 13.4 08/25/2021   HCT 40.1 08/25/2021   MCV 92.8 08/25/2021   PLT 275 08/25/2021   Recent Labs    07/14/21 0811 07/28/21 0843 08/25/21 0846  NA 132* 136 137  K 3.4* 3.8 3.7  CL 102 105 104  CO2 _0 GLUCOSE 141* 103* 133*  BUN _1 CREATININE 1.14 1.19 1.18  CALCIUM 8.5* 8.3* 8.4*  GFRNONAA >60 >60 >60  PROT 7.8 6.6 6.8  ALBUMIN 3.7 3.3* 3.4*  AST _2 ALT 35 24 22  ALKPHOS 87 82 91  BILITOT 0.8 0.5 0.4    RADIOGRAPHIC STUDIES: I have personally reviewed the radiological images as listed and agreed with the findings in the report. MR BRAIN W WO CONTRAST  Result Date: 08/22/2021 CLINICAL DATA:  Brain metastasis; brain/CNS neoplasm, staging. EXAM: MRI HEAD WITHOUT AND WITH CONTRAST TECHNIQUE: Multiplanar, multiecho pulse sequences of the brain and surrounding structures were obtained without and with intravenous contrast. CONTRAST:  47m GADAVIST GADOBUTROL 1 MMOL/ML IV SOLN COMPARISON:  MRI of the brain May 20, 2021. FINDINGS: Brain: No  acute infarction, hemorrhage, hydrocephalus or extra-axial collection. Multiple enhancing lesions are again seen in the cerebral hemispheres, as described below: * Right parietal lobe, 3 mm now, 4 mm on prior, series 18, image 117. * Right frontal lobe, 2 mm, unchanged, series 18, image 122. * Posterior left  frontal lobe, 4 mm, unchanged, series 18, image 109. * Right posterior parietal lobe, 4 mm, unchanged, series 18, image 97. * Posterior left frontal lobe, 3 mm now, 2 mm on prior, series 18, image 113. * Right frontal lobe, 2 mm now, not seen on last prior study but present on MRI performed March 28, 2021. No significant mass effect or edema. Vascular: Normal flow voids. Skull and upper cervical spine: Normal marrow signal. Sinuses/Orbits: Negative. Other: None. IMPRESSION: Interval mixed treatment response with decreasing size of 1 lesion, unchanged size of 3 lesions and increased size of 2 lesions, as described above. Electronically Signed   By: Pedro Earls M.D.   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  Currently on osimertinib 80 mg a day [osimertinib [since June 28th, 2022].  September 13th- CT scan chest and pelvis-decrease size of the left lower lobe mass/mediastinal adenopathy supraclavicular lymph nodes/omental/peritoneal nodularity.  Improvement of the contralateral lung nodules. Moderate left pleural effusion, decreased in size compared to prior exam.  We will repeat a CT scan in 1 month.  #Skin rash on the torso-likely secondary to osimertinib.  Improved with Kenalog ointment.  # Multiple brain mets subcentimeter asymptomatic [NO RT]-NOV 8th, 2022- Interval mixed treatment response with decreasing size of 1 lesion, unchanged size of 3 lesions and increased size of 2 lesions.  I  discussed with radiation oncology.-Given the subcentimeter size asymptomatic nature I think is reasonable to continue monitoring.  We will plan to get a scan again in January 2023.  Again discussed the pros and cons of pulmonary radiation; and other modalities of radiation like SBRT in detail.  #Left lung PE [incidental on CT SEP 13th,2022-]?Symptomatic DVT of the right calf-on Eliquis pain improved-stable  #Bone metastases-  September 13-CT scan shows "increasing sclerotic lesions"/likely secondary to healing process.  Dental evaluation on 9/21.  Continue calcium vitamin D intake. Currently on hold Zometa because of low calcium 8.3; ?  Osimertinib. HOLD zometa.   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 STABLE.  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  Recommend evaluation with Gwenette Greet occupational therapist.  # DISPOSITION: # refer Gwenette Greet re: steroid myopathy r # HOLD zometa; de-access # follow up in 4 week-MD; labs- cbc/cmp; possible zometa; CT [facility] - Dr.B    # I reviewed the blood work- with the patient in detail; also reviewed the imaging independently [as summarized above]; and with the patient in detail.                          All questions were answered. The patient knows to call the clinic with any problems, questions or concerns.    [name], MD 08/25/2021 10:41 PM

## 2021-08-30 ENCOUNTER — Inpatient Hospital Stay: Payer: Managed Care, Other (non HMO) | Admitting: Occupational Therapy

## 2021-08-30 ENCOUNTER — Other Ambulatory Visit: Payer: Self-pay

## 2021-08-30 DIAGNOSIS — M6281 Muscle weakness (generalized): Secondary | ICD-10-CM

## 2021-08-30 NOTE — Therapy (Signed)
[facility] [address] 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    Past Surgical History:  Procedure Laterality Date   FRACTURE SURGERY     broke femur when he was 6 years   PORTA CATH INSERTION N/A 03/28/2021   Procedure: PORTA CATH INSERTION;  Surgeon: Katha Cabal, MD;  Location: [facility];  Service: Cardiovascular;  Laterality: N/A;   VIDEO BRONCHOSCOPY WITH ENDOBRONCHIAL NAVIGATION N/A 03/15/2021   Procedure: VIDEO BRONCHOSCOPY WITH ENDOBRONCHIAL NAVIGATION;  Surgeon: Ottie Glazier, MD;  Location: [facility];  Service: Thoracic;  Laterality: N/A;   VIDEO BRONCHOSCOPY WITH ENDOBRONCHIAL ULTRASOUND N/A 03/15/2021   Procedure: VIDEO BRONCHOSCOPY WITH ENDOBRONCHIAL ULTRASOUND;  Surgeon: Ottie Glazier, MD;  Location: [facility];  Service: Thoracic;  Laterality: N/A;    There were no vitals filed for this visit.   Subjective Assessment - 08/30/21 1044     Subjective  I work 12 hrs day at the [employer] and then Friday 4-6 hrs and some Saturdays- but I told Dr B I having harder time getting up when I kneel down and my bigger muscles at my shoulders are weaker    Currently in Pain? 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  Currently on osimertinib 80 mg a day [osimertinib  [since June 28th, 2022].  September 13th- CT scan chest and pelvis-decrease size of the left lower lobe mass/mediastinal adenopathy supraclavicular lymph nodes/omental/peritoneal nodularity.  Improvement of the contralateral lung nodules. Moderate left pleural effusion, decreased in size compared to prior exam.  We will repeat a CT scan in 1 month.   #Skin rash on the torso-likely secondary to osimertinib.  Improved with Kenalog ointment.   # Multiple brain mets subcentimeter asymptomatic [NO RT]-NOV 8th, 2022- Interval mixed treatment response with decreasing size of 1 lesion, unchanged size of 3 lesions and increased size of 2 lesions.  I discussed with radiation oncology.-Given the subcentimeter size asymptomatic nature I think is reasonable to continue monitoring.  We will plan to get a scan again in January 2023.  Again discussed the pros and cons of pulmonary radiation; and other modalities of radiation like SBRT in detail.   #Left lung PE [incidental on CT SEP 13th,2022-]?Symptomatic DVT of the right calf-on Eliquis pain improved-stable   #Bone metastases-  September 13-CT scan shows "increasing sclerotic lesions"/likely secondary to healing process.  Dental evaluation on 9/21.  Continue calcium vitamin D intake. Currently on hold Zometa because of low calcium 8.3; ?  Osimertinib. HOLD zometa.    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 STABLE.   #Fatigue/steroid myopathy-recommend continued weaning of  steroids.  Recommend evaluation with Gwenette Greet occupational therapist.   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 Has to push up with his hands when kneeling down. Harder time getting up. 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 Likes to hunt in free time -but don't have to much time. Has wife and 26 yrs old daughter and 49 yrs old daughter at [place] and hip ABD and ADD weaker. Pt provided with HEP :  -Sit <> stand from firm chair with RTB around thighs - 2 x 12 rep -Hip ABD or squads with RTB - but not locking knees and not squad to low 1-2 min -Lunges for 1-2 min - but do not bend knees over toes   Arms- GTB for scapula retraction 12 reps GTB shoulder extention 12 reps Lat Pull downs from top of door- 12 reps Pt to see how he feels on his long days - do what he can -and keep pain free- some soreness normal And effort level should be medium Alternate maybe leg days and arms days  Contact me if need follow up  .                                        Visit Diagnosis: Muscle weakness (generalized)    Problem List Patient Active Problem List   Diagnosis Date Noted   Brain metastasis [facility])    Pleural effusion, left    Palliative care encounter    SVT (supraventricular tachycardia) ([place]) 03/28/2021   WPW (Wolff-Parkinson-White syndrome) 03/28/2021   Hyponatremia 03/28/2021   Transaminitis 03/28/2021   B12 deficiency 03/24/2021   Cancer of lower lobe of left lung ([place]) 03/23/2021    [name], OTR/L,CLT 08/30/2021, 10:47 AM  [facility] [address] [place], [place], [address] Phone: [phone]   Fax:  [phone]  Name: [name] MRN: [id number] Date of Birth: 11-15-1980

## 2021-09-19 ENCOUNTER — Other Ambulatory Visit: Payer: Self-pay

## 2021-09-19 ENCOUNTER — Ambulatory Visit
Admission: RE | Admit: 2021-09-19 | Discharge: 2021-09-19 | Disposition: A | Discharge status: Home / Self Care | Payer: Managed Care, Other (non HMO) | Source: Ambulatory Visit | Attending: Internal Medicine | Admitting: Internal Medicine

## 2021-09-19 DIAGNOSIS — C3432 Malignant neoplasm of lower lobe, left bronchus or lung: Secondary | ICD-10-CM | POA: Insufficient documentation

## 2021-09-19 MED ORDER — IOHEXOL 350 MG/ML SOLN
100.0000 mL | Freq: Once | INTRAVENOUS | Status: AC | PRN
  Administered 2021-09-19: 100 mL via INTRAVENOUS

## 2021-09-21 ENCOUNTER — Other Ambulatory Visit: Payer: Self-pay | Admitting: Internal Medicine

## 2021-09-22 ENCOUNTER — Encounter: Payer: Self-pay | Admitting: Internal Medicine

## 2021-09-22 ENCOUNTER — Inpatient Hospital Stay: Payer: Managed Care, Other (non HMO) | Attending: Internal Medicine

## 2021-09-22 ENCOUNTER — Other Ambulatory Visit: Payer: Self-pay

## 2021-09-22 ENCOUNTER — Inpatient Hospital Stay (HOSPITAL_BASED_OUTPATIENT_CLINIC_OR_DEPARTMENT_OTHER): Payer: Managed Care, Other (non HMO) | Admitting: Internal Medicine

## 2021-09-22 ENCOUNTER — Inpatient Hospital Stay: Payer: Managed Care, Other (non HMO)

## 2021-09-22 VITALS — BP 114/72 | HR 71 | Temp 97.8°F | Resp 16 | Wt 235.0 lb

## 2021-09-22 DIAGNOSIS — Z452 Encounter for adjustment and management of vascular access device: Secondary | ICD-10-CM | POA: Diagnosis not present

## 2021-09-22 DIAGNOSIS — C3432 Malignant neoplasm of lower lobe, left bronchus or lung: Secondary | ICD-10-CM

## 2021-09-22 DIAGNOSIS — C7931 Secondary malignant neoplasm of brain: Secondary | ICD-10-CM | POA: Insufficient documentation

## 2021-09-22 DIAGNOSIS — Z7901 Long term (current) use of anticoagulants: Secondary | ICD-10-CM | POA: Diagnosis not present

## 2021-09-22 DIAGNOSIS — C7951 Secondary malignant neoplasm of bone: Secondary | ICD-10-CM | POA: Diagnosis not present

## 2021-09-22 DIAGNOSIS — C3492 Malignant neoplasm of unspecified part of left bronchus or lung: Secondary | ICD-10-CM

## 2021-09-22 DIAGNOSIS — I2699 Other pulmonary embolism without acute cor pulmonale: Secondary | ICD-10-CM | POA: Diagnosis not present

## 2021-09-22 LAB — COMPREHENSIVE METABOLIC PANEL
ALT: 22 U/L (ref 0–44)
AST: 26 U/L (ref 15–41)
Albumin: 3.2 g/dL — ABNORMAL LOW (ref 3.5–5.0)
Alkaline Phosphatase: 116 U/L (ref 38–126)
Anion gap: 8 (ref 5–15)
BUN: 12 mg/dL (ref 6–20)
CO2: 23 mmol/L (ref 22–32)
Calcium: 8.3 mg/dL — ABNORMAL LOW (ref 8.9–10.3)
Chloride: 104 mmol/L (ref 98–111)
Creatinine, Ser: 1.14 mg/dL (ref 0.61–1.24)
GFR, Estimated: >60 mL/min (ref >60)
Glucose, Bld: 110 mg/dL — ABNORMAL HIGH (ref 70–99)
Potassium: 3.8 mmol/L (ref 3.5–5.1)
Sodium: 135 mmol/L (ref 135–145)
Total Bilirubin: 0.4 mg/dL (ref 0.3–1.2)
Total Protein: 6.8 g/dL (ref 6.5–8.1)

## 2021-09-22 LAB — CBC WITH DIFFERENTIAL/PLATELET
Abs Immature Granulocytes: 0.03 10*3/uL (ref 0.00–0.07)
Basophils Absolute: 0.1 10*3/uL (ref 0.0–0.1)
Basophils Relative: 1 %
Eosinophils Absolute: 0.2 10*3/uL (ref 0.0–0.5)
Eosinophils Relative: 3 %
HCT: 40.7 % (ref 39.0–52.0)
Hemoglobin: 13.4 g/dL (ref 13.0–17.0)
Immature Granulocytes: 1 %
Lymphocytes Relative: 21 %
Lymphs Abs: 1.3 10*3/uL (ref 0.7–4.0)
MCH: 29.8 pg (ref 26.0–34.0)
MCHC: 32.9 g/dL (ref 30.0–36.0)
MCV: 90.6 fL (ref 80.0–100.0)
Monocytes Absolute: 0.7 10*3/uL (ref 0.1–1.0)
Monocytes Relative: 11 %
Neutro Abs: 3.9 10*3/uL (ref 1.7–7.7)
Neutrophils Relative %: 63 %
Platelets: 238 10*3/uL (ref 150–400)
RBC: 4.49 MIL/uL (ref 4.22–5.81)
RDW: 12.1 % (ref 11.5–15.5)
WBC: 6.1 10*3/uL (ref 4.0–10.5)
nRBC: 0 % (ref 0.0–0.2)

## 2021-09-22 MED ORDER — HEPARIN SOD (PORK) LOCK FLUSH 100 UNIT/ML IV SOLN
INTRAVENOUS | Status: AC
  Filled 2021-09-22: qty 5

## 2021-09-22 MED ORDER — HEPARIN SOD (PORK) LOCK FLUSH 100 UNIT/ML IV SOLN
500.0000 [IU] | Freq: Once | INTRAVENOUS | Status: AC
  Administered 2021-09-22: 500 [IU] via INTRAVENOUS
  Filled 2021-09-22: qty 5

## 2021-09-22 NOTE — Assessment & Plan Note (Addendum)
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 Currently on osimertinib 80 mg a day [osimertinib [since June 28th, 2022]. Dec 7th, 2022-stable left lower lobe mass/moderate to large left pleural effusion.  Multiple solid/groundglass nodules in the right lung-slightly increased in size concerning for slightly worsening disease [see below-largest nodule up to 1 cm in size]; otherwise no evidence of any metastatic disease in the abdomen pelvis.  Stable bone lesions.  #Given the overall stability of disease/and the fact the patient is tolerating treatment well-would not recommend making any changes at this time.  Continues Osimetrinib.   #Discussed that we will plan reimaging of the chest in approximately 2 months; and plan biopsy if feasible.  Otherwise liquid biopsy-will be considered for mutational evaluation.  #Skin rash on the torso-likely secondary to osimertinib.  Improved with Kenalog ointment- STABLE.  # Multiple brain mets subcentimeter asymptomatic [NO RT]-NOV 8th, 2022- Interval mixed treatment response with decreasing size of 1 lesion, unchanged size of 3 lesions and increased size of 2 lesions. Given the subcentimeter size asymptomatic nature I think is reasonable to continue monitoring.  We will plan to get a scan again in January 2023.  Again discussed the pros and cons of pulmonary radiation; and other modalities of radiation like SBRT in detail.  #Left lung PE [incidental on CT SEP 13th,2022-]?Symptomatic DVT of the right calf-on Eliquis-STABLE.  #Bone metastases- CT DEc 7th, 2022 to healing process.  Dental evaluation on 9/21.  Continue calcium vitamin D intake. calcium 8.3; ?  Osimertinib. HOLD zometa.   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 STABLE.  # DISPOSITION: # HOLD zometa; de-access # follow up in 4 week-MD; labs- cbc/cmp; possible zometa; MRI Brain prior - Dr.B    # I  reviewed the blood work- with the patient in detail; also reviewed the imaging independently [as summarized above]; and with the patient in detail.

## 2021-09-22 NOTE — Progress Notes (Signed)
Patient denies new problems/concerns today.   °

## 2021-09-22 NOTE — Progress Notes (Signed)
[facility] CONSULT NOTE  Patient Care Team: Pcp, No as PCP - General [name], RN as Oncology Nurse Navigator [name], MD as Consulting Physician (Internal Medicine)  CHIEF COMPLAINTS/PURPOSE OF CONSULTATION: Lung cancer  #  Oncology History Overview Note  IMPRESSION: 1. Patchy nodular fat stranding throughout the anterior left upper quadrant peritoneal fat, nonspecific, cannot exclude peritoneal carcinomatosis. Dedicated CT abdomen/pelvis with oral and IV contrast recommended for further evaluation. 2. Dense patchy consolidation replacing much of the left lower lung lobe, appearing masslike in the superior segment left lower lobe, with associated bulging of the left major fissure and associated left lower lobe volume loss. Fine nodularity throughout both lungs with an upper lobe predominance. Asymmetric left upper lobe interlobular septal thickening. These findings are indeterminate, with differential including multilobar pneumonia, sarcoidosis or a neoplastic process. The persistence on radiographs back to 01/20/2021 despite antibiotic therapy make sarcoidosis or a neoplastic process more likely. Pulmonology consultation suggested for consideration of bronchoscopic evaluation. 3. Small dependent left pleural effusion. 4. Mild mediastinal lymphadenopathy, nonspecific. 5. Subacute healing lateral right sixth rib fracture.   DIAGNOSIS:  A. LUNG, LEFT LOWER LOBE; ENB-ASSISTED BIOPSY:  - NON-SMALL CELL CARCINOMA, FAVOR ADENOCARCINOMA.  - FOREIGN MATERIAL SUGGESTIVE OF ASPIRATION.   # EGFR MUTATED: 69 del- June 28th, 2022- [name].    Cancer of lower lobe of left lung ([place])  03/23/2021 Initial Diagnosis   Cancer of lower lobe of left lung ([place])   03/29/2021 Cancer Staging   Staging form: Lung, AJCC 8th Edition - Clinical: Stage IVB (cT3, cN3, pM1c) - Signed by [name], MD on 03/29/2021    03/30/2021 -  Chemotherapy     Patient is on Treatment Plan: LUNG NSCLC PEMETREXED (ALIMTA) / CARBOPLATIN Q21D X 1 CYCLES          HISTORY OF PRESENTING ILLNESS: Ambulating independently.  With his wife  [name] 40 y.o.  male patient with stage IV lung cancer adenocarcinoma  EGFR mutated on osimertinib; metastatic to brain [no WBRT] is here for follow-up/review results of the CT scan today.  In the interim, patient evaluated by Occupational Therapy for his muscle weakness.  He has not been able to do exercises at home.  However he continues to work quite long hours.  Patient denies any new symptoms.  Complains of mild to moderate fatigue.   Review of Systems  Constitutional:  Positive for malaise/fatigue. Negative for chills, diaphoresis and fever.  HENT:  Negative for nosebleeds and sore throat.   Eyes:  Negative for double vision.  Respiratory:  Negative for wheezing.   Cardiovascular:  Negative for chest pain, palpitations, orthopnea and leg swelling.  Gastrointestinal:  Negative for abdominal pain, blood in stool, diarrhea, heartburn, melena, nausea and vomiting.  Genitourinary:  Negative for dysuria, frequency and urgency.  Musculoskeletal:  Negative for back pain and joint pain.  Skin:  Negative for itching.  Neurological:  Positive for weakness. Negative for dizziness, tingling, focal weakness and headaches.  Endo/Heme/Allergies:  Does not bruise/bleed easily.  Psychiatric/Behavioral:  Negative for depression. The patient is not nervous/anxious and does not have insomnia.     MEDICAL HISTORY:  Past Medical History:  Diagnosis Date   Cancer ([place])    Dyspnea    Heartburn    Pneumonia    Wolff-Parkinson-White (WPW) 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    SURGICAL HISTORY: Past Surgical History:  Procedure Laterality Date   FRACTURE SURGERY     broke  femur when he was 6 years   PORTA CATH INSERTION N/A 03/28/2021   Procedure: PORTA CATH INSERTION;  Surgeon: Katha Cabal, MD;  Location: [facility];  Service: Cardiovascular;  Laterality: N/A;   VIDEO BRONCHOSCOPY WITH ENDOBRONCHIAL NAVIGATION N/A 03/15/2021   Procedure: VIDEO BRONCHOSCOPY WITH ENDOBRONCHIAL NAVIGATION;  Surgeon: Ottie Glazier, MD;  Location: [facility];  Service: Thoracic;  Laterality: N/A;   VIDEO BRONCHOSCOPY WITH ENDOBRONCHIAL ULTRASOUND N/A 03/15/2021   Procedure: VIDEO BRONCHOSCOPY WITH ENDOBRONCHIAL ULTRASOUND;  Surgeon: Ottie Glazier, MD;  Location: [facility];  Service: Thoracic;  Laterality: N/A;    SOCIAL HISTORY: Social History   Socioeconomic History   Marital status: Married    Spouse name: [name]   Number of children: 2   Years of education: Not on file   Highest education level: Not on file  Occupational History   Not on file  Tobacco Use   Smoking status: Never   Smokeless tobacco: Current    Types: Snuff, Chew  Vaping Use   Vaping Use: Never used  Substance and Sexual Activity   Alcohol use: Never   Drug use: Never   Sexual activity: Not on file  Other Topics Concern   Not on file  Social History Narrative   [place]; with wife; 2 daughters[12 and 22]; never smoked; rare alcohol. Work in saw [employer]    Social Determinants of Health   Financial Resource Strain: Not on file  Food Insecurity: Not on file  Transportation Needs: Not on file  Physical Activity: Not on file  Stress: Not on file  Social Connections: Not on file  Intimate Partner Violence: Not on file    FAMILY HISTORY: Family History  Problem Relation Age of Onset   High Cholesterol Mother    Hypertension Father    Cancer Father    Lung cancer Father    Skin cancer Father     ALLERGIES:  is allergic to codeine.  MEDICATIONS:  Current Outpatient Medications  Medication Sig Dispense Refill   apixaban (ELIQUIS) 5 MG TABS tablet Take 1 tablet (29m) twice daily 60 tablet 2   clindamycin (CLINDAGEL) 1 % gel Apply topically 2 (two) times daily. Apply to affected areas twice a day. 30 g 0    folic acid (FOLVITE) 1 MG tablet Take 1 tablet (1 mg total) by mouth daily. 90 tablet 1   lidocaine-prilocaine (EMLA) cream Apply 1 application topically as needed. 30 g 0   metoprolol succinate (TOPROL XL) 25 MG 24 hr tablet Take 1 tablet (25 mg total) by mouth daily. (Patient taking differently: Take 50 mg by mouth daily.) 30 tablet 1   sertraline (ZOLOFT) 50 MG tablet Take 0.5 tablets (25 mg total) by mouth daily. 30 tablet 2   TAGRISSO 80 MG tablet TAKE 1 TABLET (80 MG TOTAL) DAILY 30 tablet 4   triamcinolone ointment (KENALOG) 0.5 % Apply 1 application topically 2 (two) times daily. 30 g 0   dexamethasone (DECADRON) 1 MG tablet Take 1 tablet (1 mg total) by mouth 2 (two) times daily with a meal. (Patient not taking: Reported on 07/28/2021) 60 tablet 1   ondansetron (ZOFRAN) 8 MG tablet One pill every 8 hours as needed for nausea/vomitting. (Patient not taking: Reported on 05/15/2021) 40 tablet 1   prochlorperazine (COMPAZINE) 10 MG tablet Take 1 tablet (10 mg total) by mouth every 6 (six) hours as needed for nausea or vomiting. (Patient not taking: Reported on 05/15/2021) 40 tablet 1  No current facility-administered medications for this visit.   Facility-Administered Medications Ordered in Other Visits  Medication Dose Route Frequency Provider Last Rate Last Admin   heparin lock flush 100 UNIT/ML injection            heparin lock flush 100 UNIT/ML injection            sodium chloride flush (NS) 0.9 % injection 10 mL  10 mL Intravenous PRN [name], MD   10 mL at 07/28/21 0852      .  PHYSICAL EXAMINATION: ECOG PERFORMANCE STATUS: 1 - Symptomatic but completely ambulatory  Vitals:   09/22/21 0932  BP: 114/72  Pulse: 71  Resp: 16  Temp: 97.8 F (36.6 C)   Filed Weights   09/22/21 0932  Weight: 235 lb (106.6 kg)     Physical Exam HENT:     Head: Normocephalic and atraumatic.     Mouth/Throat:     Pharynx: No oropharyngeal exudate.  Eyes:     Pupils: Pupils  are equal, round, and reactive to light.  Cardiovascular:     Rate and Rhythm: Normal rate and regular rhythm.  Pulmonary:     Effort: No respiratory distress.     Breath sounds: No wheezing.     Comments: Decreased breath sound on the left side compared to right. Abdominal:     General: Bowel sounds are normal. There is no distension.     Palpations: Abdomen is soft. There is no mass.     Tenderness: There is no abdominal tenderness. There is no guarding or rebound.  Musculoskeletal:        General: No tenderness. Normal range of motion.     Cervical back: Normal range of motion and neck supple.  Skin:    General: Skin is warm.  Neurological:     Mental Status: He is alert and oriented to person, place, and time.  Psychiatric:        Mood and Affect: Affect normal.   Fungal dermatitis bilateral buttocks groin/underneath abdominal pannus  LABORATORY DATA:  I have reviewed the data as listed Lab Results  Component Value Date   WBC 6.1 09/22/2021   HGB 13.4 09/22/2021   HCT 40.7 09/22/2021   MCV 90.6 09/22/2021   PLT 238 09/22/2021   Recent Labs    07/28/21 0843 08/25/21 0846 09/22/21 0903  NA 136 137 135  K 3.8 3.7 3.8  CL 105 104 104  CO2 '26 24 23  ' GLUCOSE 103* 133* 110*  BUN '16 9 12  ' CREATININE 1.19 1.18 1.14  CALCIUM 8.3* 8.4* 8.3*  GFRNONAA >60 >60 >60  PROT 6.6 6.8 6.8  ALBUMIN 3.3* 3.4* 3.2*  AST '20 29 26  ' ALT '24 22 22  ' ALKPHOS 82 91 116  BILITOT 0.5 0.4 0.4    RADIOGRAPHIC STUDIES: I have personally reviewed the radiological images as listed and agreed with the findings in the report. CT CHEST ABDOMEN PELVIS W CONTRAST  Result Date: 09/20/2021 CLINICAL DATA:  Metastatic left lower lobe lung cancer restaging, ongoing chemotherapy EXAM: CT CHEST, ABDOMEN, AND PELVIS WITH CONTRAST TECHNIQUE: Multidetector CT imaging of the chest, abdomen and pelvis was performed following the standard protocol during bolus administration of intravenous contrast. CONTRAST:   17m OMNIPAQUE IOHEXOL 350 MG/ML SOLN, additional oral enteric contrast COMPARISON:  06/27/2021 FINDINGS: CT CHEST FINDINGS Cardiovascular: Right chest port catheter. Normal heart size. No pericardial effusion. Mediastinum/Nodes: No persistently enlarged mediastinal, hilar, or axillary lymph nodes. Thyroid gland, trachea, and  esophagus demonstrate no significant findings. Lungs/Pleura: Unchanged moderate to large left pleural effusion with associated smooth pleural thickening and atelectasis or consolidation. No significant change in post treatment appearance of the dependent left lower lobe mass (series 2, image 38). No significant change in background of innumerable tiny pulmonary nodules, most concentrated in the lung apices. There are multiple mixed solid and ground-glass pulmonary nodules throughout the right lung, which are generally increased in size and solid character, index nodule of the posterior right middle lobe measuring 1.0 x 0.8 cm, previously 0.7 x 0.7 cm (series 3, image 114), additional index nodule of the medial segment right middle lobe measuring 0.8 x 0.6 cm, previously 0.4 x 0.4 cm (series 3, image 121). Musculoskeletal: No chest wall mass. CT ABDOMEN PELVIS FINDINGS Hepatobiliary: No solid liver abnormality is seen. No gallstones, gallbladder wall thickening, or biliary dilatation. Pancreas: Unremarkable. No pancreatic ductal dilatation or surrounding inflammatory changes. Spleen: Normal in size without significant abnormality. Adrenals/Urinary Tract: Adrenal glands are unremarkable. Kidneys are normal, without renal calculi, solid lesion, or hydronephrosis. Bladder is unremarkable. Stomach/Bowel: Stomach is within normal limits. Appendix appears normal. No evidence of bowel wall thickening, distention, or inflammatory changes. Vascular/Lymphatic: No significant vascular findings are present. No enlarged abdominal or pelvic lymph nodes. Reproductive: No mass or other abnormality. Other: No  abdominal wall hernia or abnormality. No abdominopelvic ascites. Musculoskeletal: Numerous scattered small sclerotic lesions throughout the included osseous structures are not significantly changed. IMPRESSION: 1. No significant change in post treatment appearance of a dependent left lower lobe mass. 2. Unchanged moderate to large left pleural effusion with associated smooth pleural thickening and atelectasis or consolidation, highly suspicious for malignant pleural effusion. 3. There are multiple mixed solid and ground-glass pulmonary nodules throughout the right lung, which are generally increased in size and solid character, consistent with worsened pulmonary metastatic disease. 4. No significant change in background of innumerable tiny pulmonary nodules, most concentrated in the lung apices. These may reflect tiny pulmonary metastases or alternately smoking-related respiratory bronchiolitis. 5. Numerous scattered small sclerotic lesions throughout the included osseous structures are not significantly changed consistent with treated osseous metastases. 6. No evidence of soft tissue metastatic disease in the abdomen or pelvis. Electronically Signed   By: Delanna Ahmadi M.D.   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 Currently on osimertinib 80 mg a day [osimertinib [since June 28th, 2022]. Dec 7th, 2022-stable left lower lobe mass/moderate to large left pleural effusion.  Multiple solid/groundglass nodules in the right lung-slightly increased in size concerning for slightly worsening disease [see below-largest nodule up to 1 cm in size]; otherwise no evidence of any metastatic disease in the abdomen pelvis.  Stable bone lesions.  #Given the overall stability of disease/and the fact the patient is tolerating treatment well-would not recommend making any changes at this time.   Continues Osimetrinib.   #Discussed that we will plan reimaging of the chest in approximately 2 months; and plan biopsy if feasible.  Otherwise liquid biopsy-will be considered for mutational evaluation.  #Skin rash on the torso-likely secondary to osimertinib.  Improved with Kenalog ointment- STABLE.  # Multiple brain mets subcentimeter asymptomatic [NO RT]-NOV 8th, 2022- Interval mixed treatment response with decreasing size of 1 lesion, unchanged size of 3 lesions and increased size of 2 lesions. Given the subcentimeter size asymptomatic nature I think is reasonable  to continue monitoring.  We will plan to get a scan again in January 2023.  Again discussed the pros and cons of pulmonary radiation; and other modalities of radiation like SBRT in detail.  #Left lung PE [incidental on CT SEP 13th,2022-]?Symptomatic DVT of the right calf-on Eliquis-STABLE.  #Bone metastases- CT DEc 7th, 2022 to healing process.  Dental evaluation on 9/21.  Continue calcium vitamin D intake. calcium 8.3; ?  Osimertinib. HOLD zometa.   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 STABLE.  # DISPOSITION: # HOLD zometa; de-access # follow up in 4 week-MD; labs- cbc/cmp; possible zometa; MRI Brain prior - Dr.B    # I reviewed the blood work- with the patient in detail; also reviewed the imaging independently [as summarized above]; and with the patient in detail.                              All questions were answered. The patient knows to call the clinic with any problems, questions or concerns.    [name], MD 09/22/2021 12:17 PM

## 2021-09-24 ENCOUNTER — Encounter: Payer: Self-pay | Admitting: Internal Medicine

## 2021-09-25 ENCOUNTER — Other Ambulatory Visit: Payer: Self-pay

## 2021-09-25 ENCOUNTER — Other Ambulatory Visit: Payer: Self-pay | Admitting: *Deleted

## 2021-09-25 MED ORDER — FOLIC ACID 1 MG PO TABS: 1.0000 mg | ORAL_TABLET | Freq: Every day | ORAL | 1 refills | Status: DC

## 2021-10-10 ENCOUNTER — Other Ambulatory Visit: Payer: Managed Care, Other (non HMO)

## 2021-10-11 ENCOUNTER — Other Ambulatory Visit: Payer: Self-pay | Admitting: Oncology

## 2021-10-13 ENCOUNTER — Ambulatory Visit: Payer: Managed Care, Other (non HMO)

## 2021-10-13 ENCOUNTER — Ambulatory Visit: Payer: Managed Care, Other (non HMO) | Admitting: Internal Medicine

## 2021-10-13 ENCOUNTER — Other Ambulatory Visit: Payer: Managed Care, Other (non HMO)

## 2021-10-18 ENCOUNTER — Ambulatory Visit
Admission: RE | Admit: 2021-10-18 | Discharge: 2021-10-18 | Disposition: A | Discharge status: Home / Self Care | Payer: Managed Care, Other (non HMO) | Source: Ambulatory Visit | Attending: Internal Medicine | Admitting: Internal Medicine

## 2021-10-18 DIAGNOSIS — C7931 Secondary malignant neoplasm of brain: Secondary | ICD-10-CM | POA: Diagnosis present

## 2021-10-18 DIAGNOSIS — C3432 Malignant neoplasm of lower lobe, left bronchus or lung: Secondary | ICD-10-CM | POA: Insufficient documentation

## 2021-10-18 IMAGING — MR MR HEAD WO/W CM
15 series · 48 of 48 positions shown · IV contrast (gadavist)
Comparison: Head MRI [DATE]

CLINICAL DATA: Lung cancer with brain metastases.

EXAM:
MRI HEAD WITHOUT AND WITH CONTRAST
TECHNIQUE: Multiplanar, multiecho pulse sequences of the brain and surrounding
structures were obtained without and with intravenous contrast.
CONTRAST:  10mL GADAVIST GADOBUTROL 1 MMOL/ML IV SOLN

[Series 5: ax dwi_tracew · axial · 3.0mm · 0.65mm/px · z∈[-91,+64]mm · 4 of 48 slices shown]
[im 1/48]
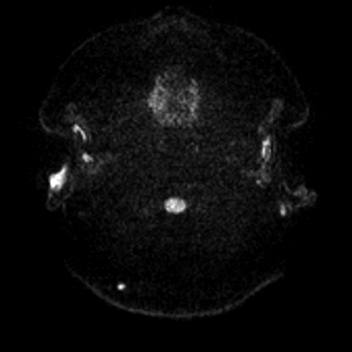
[im 16/48]
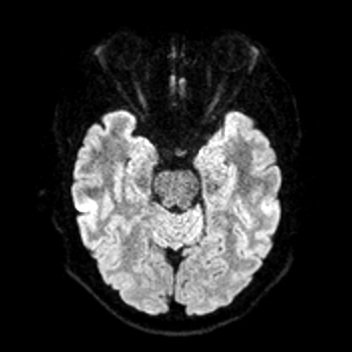
[im 32/48]
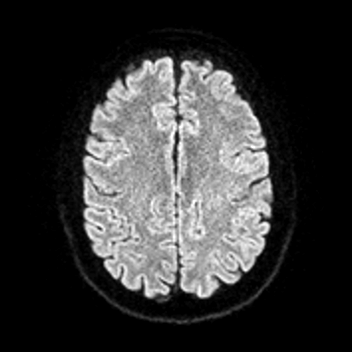
[im 48/48]
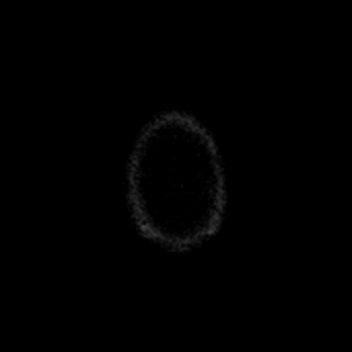

[Series 6: ax dwi_adc · axial · 3.0mm · 0.65mm/px · z∈[-91,+64]mm · 3 of 48 slices shown]
[im 1/48]
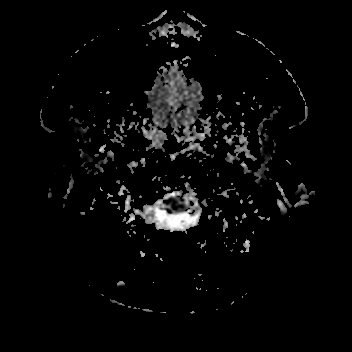
[im 24/48]
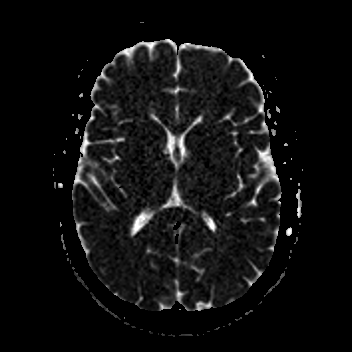
[im 48/48]
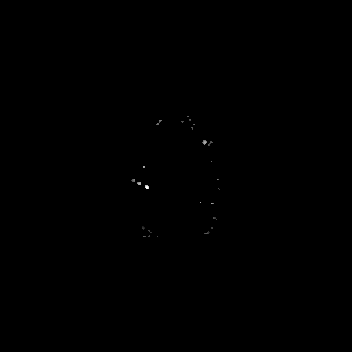

[Series 7: cor dwi_tracew · coronal · 5.0mm · 0.65mm/px · 2 of 40 slices shown]
[im 1/40]
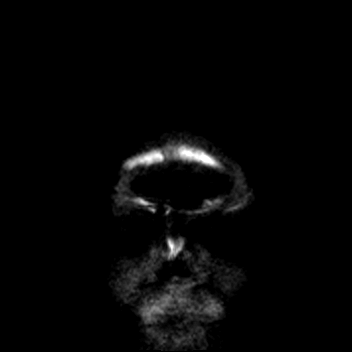
[im 40/40]
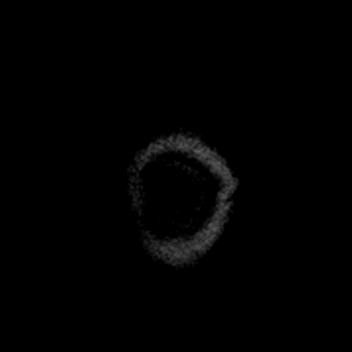

[Series 8: cor dwi_adc · coronal · 5.0mm · 0.65mm/px · 2 of 40 slices shown]
[im 1/40]
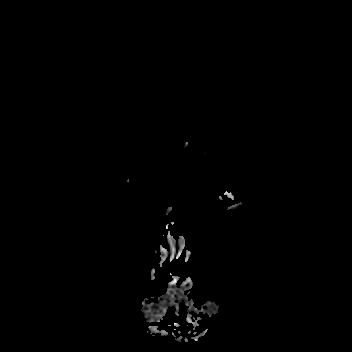
[im 40/40]
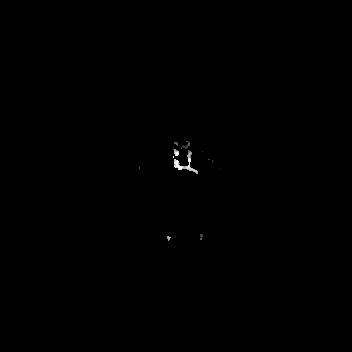

[Series 9: T1 · sagittal · 5.0mm · 0.62mm/px · 1 of 25 slices shown (1 of 2)]
[im 1/25]
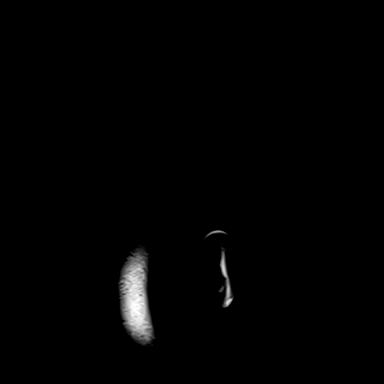

[Series 10: T2 · axial · 5.0mm · 0.53mm/px · 1 of 25 slices shown]
[im 1/25]
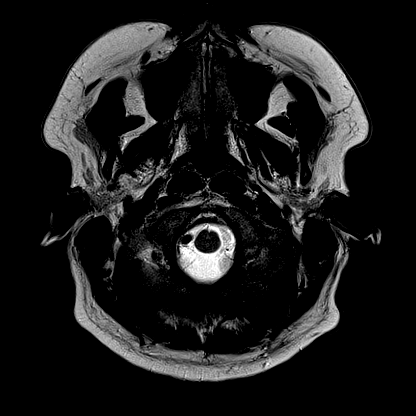

[Series 11: mag_images · axial · 3.0mm · 0.90mm/px · z∈[-102,+75]mm · 3 of 60 slices shown]
[im 1/60]
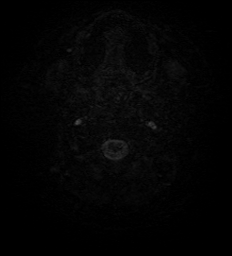
[im 30/60]
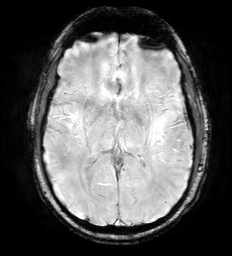
[im 60/60]
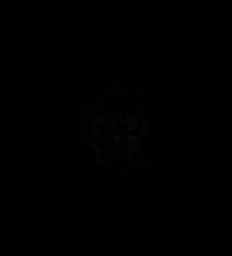

[Series 12: pha_images · axial · 3.0mm · 0.90mm/px · z∈[-102,+75]mm · 3 of 60 slices shown]
[im 1/60]
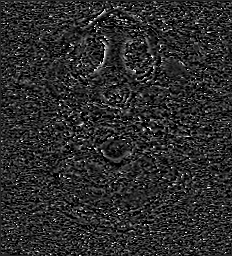
[im 30/60]
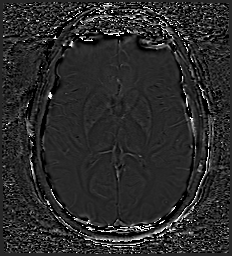
[im 60/60]
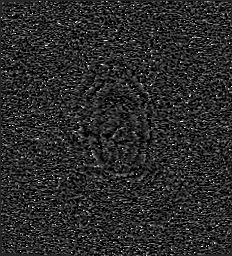

[Series 13: swi_images · axial · 3.0mm · 0.90mm/px · z∈[-102,+75]mm · 3 of 60 slices shown]
[im 1/60]
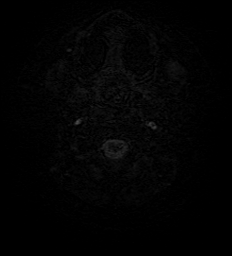
[im 30/60]
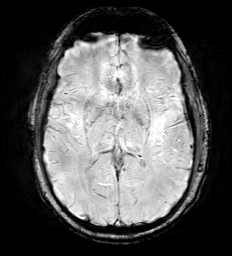
[im 60/60]
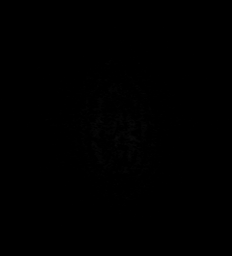

[Series 15: FLAIR · axial · 3.0mm · 0.53mm/px · z∈[-94,+68]mm · 3 of 55 slices shown]
[im 1/55]
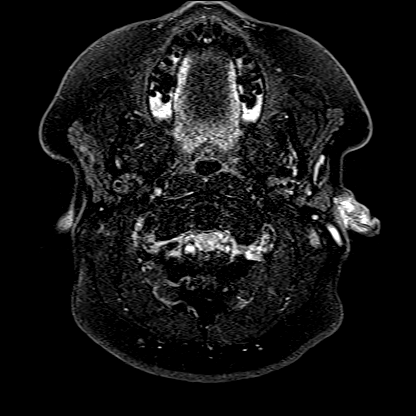
[im 28/55]
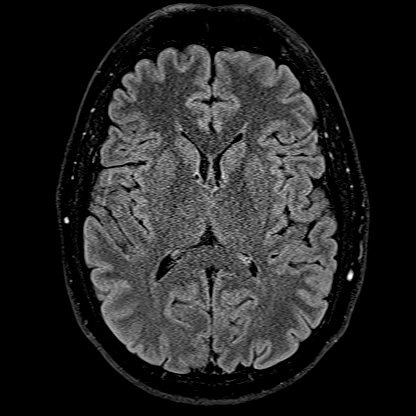
[im 55/55]
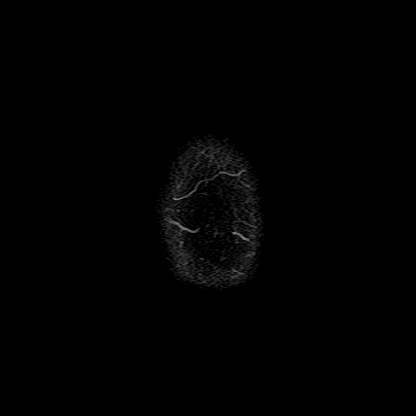

[Series 16: T1 · axial · 1.0mm · 0.98mm/px · z∈[-101,+74]mm · 9 of 176 slices shown (2 of 2)]
[im 1/176]
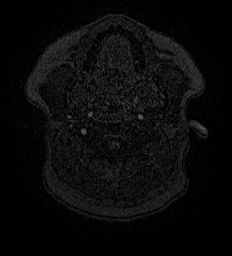
[im 22/176]
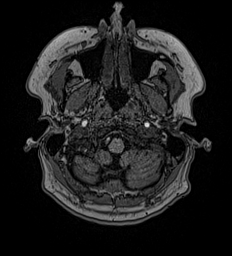
[im 44/176]
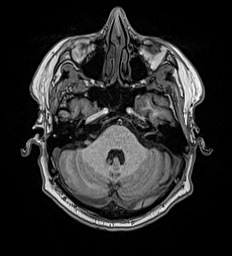
[im 66/176]
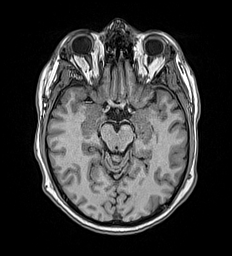
[im 88/176]
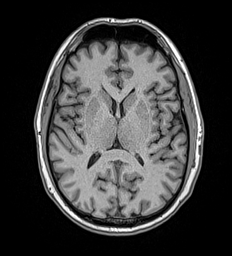
[im 110/176]
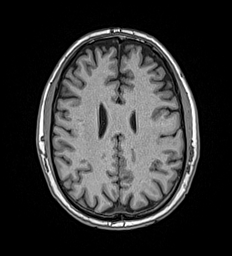
[im 132/176]
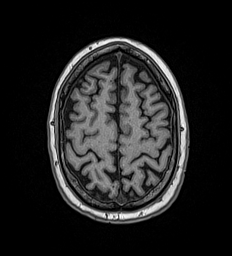
[im 154/176]
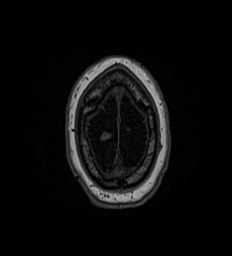
[im 176/176]
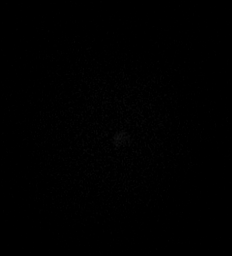

[Series 17: T2 post-contrast · coronal · 5.0mm · 0.57mm/px · 2 of 29 slices shown]
[im 1/29]
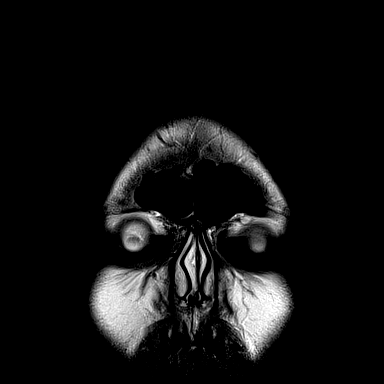
[im 29/29]
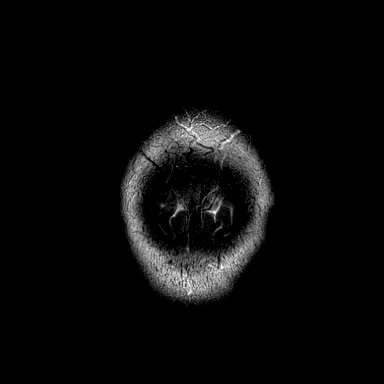

[Series 18: T1 post-contrast · axial · 1.0mm · 0.98mm/px · z∈[-101,+74]mm · 9 of 176 slices shown (1 of 3)]
[im 1/176]
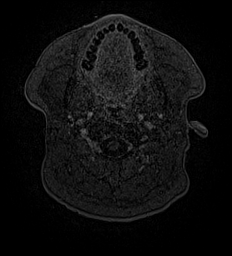
[im 22/176]
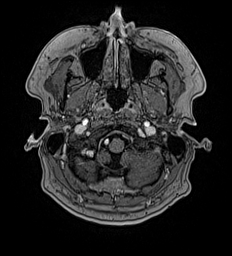
[im 44/176]
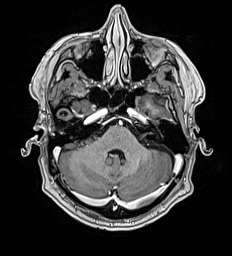
[im 66/176]
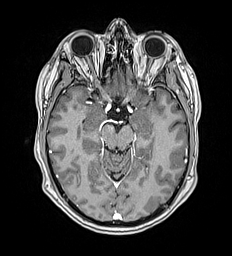
[im 88/176]
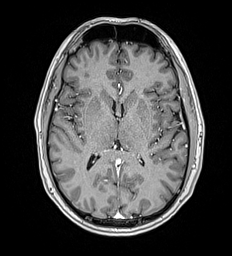
[im 110/176]
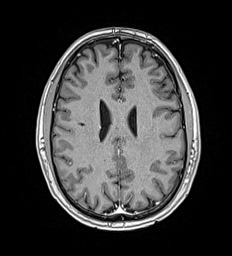
[im 132/176]
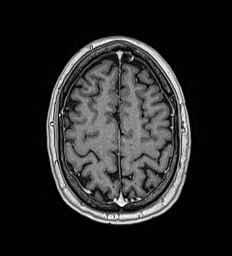
[im 154/176]
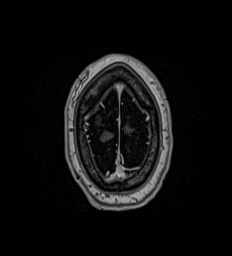
[im 176/176]
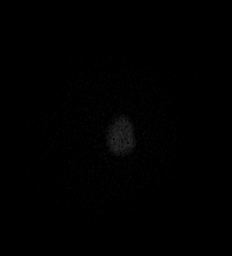

[Series 19: T1 post-contrast · coronal · 5.0mm · 0.57mm/px · 2 of 29 slices shown (2 of 3)]
[im 1/29]
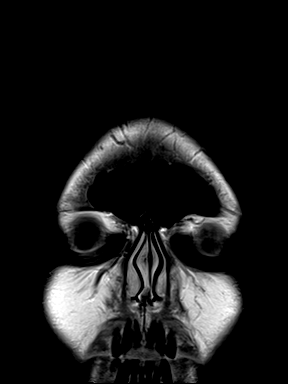
[im 29/29]
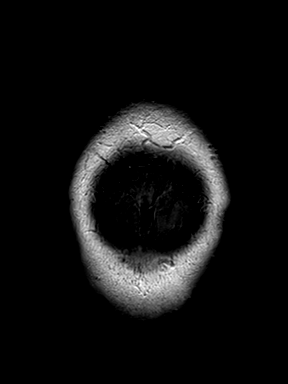

[Series 20: T1 post-contrast · sagittal · 5.0mm · 0.62mm/px · 1 of 25 slices shown (3 of 3)]
[im 1/25]
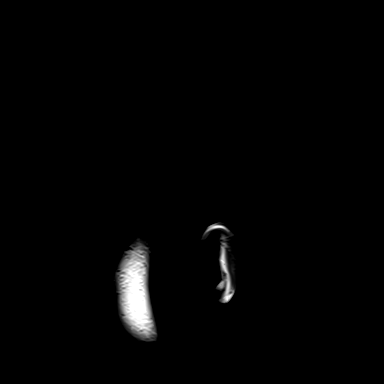

[48 of 48 positions shown; findings below may reference images not displayed]

FINDINGS: Brain: There is no evidence of an acute infarct, midline shift, or
extra-axial fluid collection. The ventricles and sulci are normal.

Enhancing brain lesions are all stable to slightly smaller in size
as follows:

1. 3 mm in the right parietal lobe (series 18, image 95).
2. 3 mm in the posterior left frontal lobe (series 18, image 116).
3. 2 mm in the posterior left frontal lobe (series 18, image 118).
4. 2 mm in the right parietal lobe (series 18, image 119).
5. 2 mm in the lateral right frontal lobe (series 18, image 121).
6. 2 mm in the medial right frontal lobe (series 18, image 133).

There are chronic blood products associated with some of these
lesions. There is no edema. No new enhancing lesions are identified.

Vascular: Major intracranial vascular flow voids are preserved.

Skull and upper cervical spine: Unremarkable bone marrow signal.

Sinuses/Orbits: Unremarkable orbits. Small mucous retention cyst in
the left maxillary sinus. Clear mastoid air cells.

Other: None.
IMPRESSION: Stable to slightly decreased size of six enhancing brain metastases.
No evidence of new or progressive disease.

## 2021-10-18 MED ORDER — GADOBUTROL 1 MMOL/ML IV SOLN
10.0000 mL | Freq: Once | INTRAVENOUS | Status: AC | PRN
  Administered 2021-10-18: 10 mL via INTRAVENOUS

## 2021-10-20 ENCOUNTER — Inpatient Hospital Stay (HOSPITAL_BASED_OUTPATIENT_CLINIC_OR_DEPARTMENT_OTHER): Payer: Managed Care, Other (non HMO) | Admitting: Internal Medicine

## 2021-10-20 ENCOUNTER — Inpatient Hospital Stay: Payer: Managed Care, Other (non HMO)

## 2021-10-20 ENCOUNTER — Encounter: Payer: Self-pay | Admitting: Internal Medicine

## 2021-10-20 ENCOUNTER — Other Ambulatory Visit: Payer: Self-pay

## 2021-10-20 ENCOUNTER — Inpatient Hospital Stay: Payer: Managed Care, Other (non HMO) | Attending: Internal Medicine

## 2021-10-20 DIAGNOSIS — Z452 Encounter for adjustment and management of vascular access device: Secondary | ICD-10-CM | POA: Insufficient documentation

## 2021-10-20 DIAGNOSIS — C7951 Secondary malignant neoplasm of bone: Secondary | ICD-10-CM | POA: Diagnosis not present

## 2021-10-20 DIAGNOSIS — J90 Pleural effusion, not elsewhere classified: Secondary | ICD-10-CM | POA: Diagnosis not present

## 2021-10-20 DIAGNOSIS — C7931 Secondary malignant neoplasm of brain: Secondary | ICD-10-CM | POA: Diagnosis not present

## 2021-10-20 DIAGNOSIS — C3432 Malignant neoplasm of lower lobe, left bronchus or lung: Secondary | ICD-10-CM

## 2021-10-20 DIAGNOSIS — Z95828 Presence of other vascular implants and grafts: Secondary | ICD-10-CM

## 2021-10-20 DIAGNOSIS — C3492 Malignant neoplasm of unspecified part of left bronchus or lung: Secondary | ICD-10-CM

## 2021-10-20 LAB — COMPREHENSIVE METABOLIC PANEL
ALT: 34 U/L (ref 0–44)
AST: 29 U/L (ref 15–41)
Albumin: 3.4 g/dL — ABNORMAL LOW (ref 3.5–5.0)
Alkaline Phosphatase: 141 U/L — ABNORMAL HIGH (ref 38–126)
Anion gap: 5 (ref 5–15)
BUN: 10 mg/dL (ref 6–20)
CO2: 25 mmol/L (ref 22–32)
Calcium: 8.3 mg/dL — ABNORMAL LOW (ref 8.9–10.3)
Chloride: 105 mmol/L (ref 98–111)
Creatinine, Ser: 1.24 mg/dL (ref 0.61–1.24)
GFR, Estimated: >60 mL/min (ref >60)
Glucose, Bld: 108 mg/dL — ABNORMAL HIGH (ref 70–99)
Potassium: 3.6 mmol/L (ref 3.5–5.1)
Sodium: 135 mmol/L (ref 135–145)
Total Bilirubin: 0.4 mg/dL (ref 0.3–1.2)
Total Protein: 7.5 g/dL (ref 6.5–8.1)

## 2021-10-20 LAB — CBC WITH DIFFERENTIAL/PLATELET
Abs Immature Granulocytes: 0.03 10*3/uL (ref 0.00–0.07)
Basophils Absolute: 0 10*3/uL (ref 0.0–0.1)
Basophils Relative: 1 %
Eosinophils Absolute: 0.2 10*3/uL (ref 0.0–0.5)
Eosinophils Relative: 3 %
HCT: 42.2 % (ref 39.0–52.0)
Hemoglobin: 13.8 g/dL (ref 13.0–17.0)
Immature Granulocytes: 0 %
Lymphocytes Relative: 19 %
Lymphs Abs: 1.3 10*3/uL (ref 0.7–4.0)
MCH: 28.3 pg (ref 26.0–34.0)
MCHC: 32.7 g/dL (ref 30.0–36.0)
MCV: 86.5 fL (ref 80.0–100.0)
Monocytes Absolute: 0.7 10*3/uL (ref 0.1–1.0)
Monocytes Relative: 10 %
Neutro Abs: 4.6 10*3/uL (ref 1.7–7.7)
Neutrophils Relative %: 67 %
Platelets: 280 10*3/uL (ref 150–400)
RBC: 4.88 MIL/uL (ref 4.22–5.81)
RDW: 12.5 % (ref 11.5–15.5)
WBC: 6.9 10*3/uL (ref 4.0–10.5)
nRBC: 0 % (ref 0.0–0.2)

## 2021-10-20 MED ORDER — APIXABAN 5 MG PO TABS: 5.0000 mg | ORAL_TABLET | Freq: Two times a day (BID) | ORAL | 2 refills | Status: DC

## 2021-10-20 MED ORDER — SODIUM CHLORIDE 0.9% FLUSH
10.0000 mL | INTRAVENOUS | Status: DC | PRN
  Administered 2021-10-20: 10 mL via INTRAVENOUS
  Filled 2021-10-20: qty 10

## 2021-10-20 MED ORDER — HEPARIN SOD (PORK) LOCK FLUSH 100 UNIT/ML IV SOLN
500.0000 [IU] | Freq: Once | INTRAVENOUS | Status: AC
  Administered 2021-10-20: 500 [IU] via INTRAVENOUS
  Filled 2021-10-20: qty 5

## 2021-10-20 NOTE — Progress Notes (Signed)
Needs refill on eliquis. Message sent to pharm. To see if we have a coupon for this rx?

## 2021-10-20 NOTE — Assessment & Plan Note (Addendum)
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 Currently on osimertinib 80 mg a day [osimertinib [since June 28th, 2022]. Dec 7th, 2022-stable left lower lobe mass/moderate to large left pleural effusion.  Multiple solid/groundglass nodules in the right lung-slightly increased in size concerning for slightly worsening disease [see below-largest nodule up to 1 cm in size]; otherwise no evidence of any metastatic disease in the abdomen pelvis.  Stable bone lesions.  #Given the overall stability of disease/and the fact the patient is tolerating treatment well-would not recommend making any changes at this time.  Continues Osimetrinib.  We will plan the imaging in March/April.  #Skin rash on the torso-likely secondary to osimertinib.  Improved with Kenalog ointment- STABLE.  # Multiple brain mets subcentimeter asymptomatic [NO RT]-January 2023 brain MRI shows stable/improved brain lesions.  Continue surveillance imaging without any radiation.  #Left lung PE [incidental on CT SEP 13th,2022-]?Symptomatic DVT of the right calf-on Eliquis-STABLE.  #Bone metastases- CT DEc 7th, 2022 to healing process.  Dental evaluation on 9/21.  Continue calcium vitamin D intake. calcium 8.5; ?  Osimertinib. HOLD zometa.   #Bilateral hip pain/groin pain-intermittent not persistent-suspect musculoskeletal rather than malignancy or/others.  If continues to get worse would recommend further imaging.  Recommend exercises as per OT/maureen.   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 STABLE.  # DISPOSITION: # HOLD zometa; de-access # follow up in 4 week-MD; labs- cbc/cmp; possible zometa; - Dr.B    # I reviewed the blood work- with the patient in detail; also reviewed the imaging independently [as summarized above]; and with the patient in detail.

## 2021-10-20 NOTE — Progress Notes (Signed)
[facility] CONSULT NOTE  Patient Care Team: Pcp, No as PCP - General [name], RN as Oncology Nurse Navigator [name], MD as Consulting Physician (Internal Medicine)  CHIEF COMPLAINTS/PURPOSE OF CONSULTATION: Lung cancer  #  Oncology History Overview Note  IMPRESSION: 1. Patchy nodular fat stranding throughout the anterior left upper quadrant peritoneal fat, nonspecific, cannot exclude peritoneal carcinomatosis. Dedicated CT abdomen/pelvis with oral and IV contrast recommended for further evaluation. 2. Dense patchy consolidation replacing much of the left lower lung lobe, appearing masslike in the superior segment left lower lobe, with associated bulging of the left major fissure and associated left lower lobe volume loss. Fine nodularity throughout both lungs with an upper lobe predominance. Asymmetric left upper lobe interlobular septal thickening. These findings are indeterminate, with differential including multilobar pneumonia, sarcoidosis or a neoplastic process. The persistence on radiographs back to 01/20/2021 despite antibiotic therapy make sarcoidosis or a neoplastic process more likely. Pulmonology consultation suggested for consideration of bronchoscopic evaluation. 3. Small dependent left pleural effusion. 4. Mild mediastinal lymphadenopathy, nonspecific. 5. Subacute healing lateral right sixth rib fracture.   DIAGNOSIS:  A. LUNG, LEFT LOWER LOBE; ENB-ASSISTED BIOPSY:  - NON-SMALL CELL CARCINOMA, FAVOR ADENOCARCINOMA.  - FOREIGN MATERIAL SUGGESTIVE OF ASPIRATION.   # EGFR MUTATED: 64 del- June 28th, 2022- [name].    Cancer of lower lobe of left lung ([place])  03/23/2021 Initial Diagnosis   Cancer of lower lobe of left lung ([place])   03/29/2021 Cancer Staging   Staging form: Lung, AJCC 8th Edition - Clinical: Stage IVB (cT3, cN3, pM1c) - Signed by [name], MD on 03/29/2021    03/30/2021 -  Chemotherapy     Patient is on Treatment Plan: LUNG NSCLC PEMETREXED (ALIMTA) / CARBOPLATIN Q21D X 1 CYCLES          HISTORY OF PRESENTING ILLNESS: Ambulating independently.  With his wife  [name] 41 y.o.  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  Patient denies any headaches but denies any nausea vomiting.  Patient complains of intermittent pain in his bilateral groin especially when standing intermittently.  It gets better with movement.  Pain does not get any worse during nighttime.   Review of Systems  Constitutional:  Positive for malaise/fatigue. Negative for chills, diaphoresis and fever.  HENT:  Negative for nosebleeds and sore throat.   Eyes:  Negative for double vision.  Respiratory:  Negative for wheezing.   Cardiovascular:  Negative for chest pain, palpitations, orthopnea and leg swelling.  Gastrointestinal:  Negative for abdominal pain, blood in stool, diarrhea, heartburn, melena, nausea and vomiting.  Genitourinary:  Negative for dysuria, frequency and urgency.  Musculoskeletal:  Negative for back pain and joint pain.  Skin:  Negative for itching.  Neurological:  Positive for weakness. Negative for dizziness, tingling, focal weakness and headaches.  Endo/Heme/Allergies:  Does not bruise/bleed easily.  Psychiatric/Behavioral:  Negative for depression. The patient is not nervous/anxious and does not have insomnia.     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    SURGICAL HISTORY: Past Surgical History:  Procedure Laterality Date   FRACTURE SURGERY     broke femur when he was 6 years  PORTA CATH INSERTION N/A 03/28/2021   Procedure: PORTA CATH INSERTION;  Surgeon: Katha Cabal, MD;  Location: [facility];  Service: Cardiovascular;  Laterality: N/A;   VIDEO BRONCHOSCOPY WITH ENDOBRONCHIAL NAVIGATION N/A 03/15/2021   Procedure: VIDEO BRONCHOSCOPY WITH ENDOBRONCHIAL NAVIGATION;  Surgeon: Ottie Glazier, MD;  Location: [facility];  Service: Thoracic;  Laterality: N/A;   VIDEO BRONCHOSCOPY WITH ENDOBRONCHIAL ULTRASOUND N/A 03/15/2021   Procedure: VIDEO BRONCHOSCOPY WITH ENDOBRONCHIAL ULTRASOUND;  Surgeon: Ottie Glazier, MD;  Location: [facility];  Service: Thoracic;  Laterality: N/A;    SOCIAL HISTORY: Social History   Socioeconomic History   Marital status: Married    Spouse name: [name]   Number of children: 2   Years of education: Not on file   Highest education level: Not on file  Occupational History   Not on file  Tobacco Use   Smoking status: Never   Smokeless tobacco: Current    Types: Snuff, Chew  Vaping Use   Vaping Use: Never used  Substance and Sexual Activity   Alcohol use: Never   Drug use: Never   Sexual activity: Not on file  Other Topics Concern   Not on file  Social History Narrative   [place]; with wife; 2 daughters[12 and 22]; never smoked; rare alcohol. Work in saw [employer]    Social Determinants of Health   Financial Resource Strain: Not on file  Food Insecurity: Not on file  Transportation Needs: Not on file  Physical Activity: Not on file  Stress: Not on file  Social Connections: Not on file  Intimate Partner Violence: Not on file    FAMILY HISTORY: Family History  Problem Relation Age of Onset   High Cholesterol Mother    Hypertension Father    Cancer Father    Lung cancer Father    Skin cancer Father     ALLERGIES:  is allergic to codeine.  MEDICATIONS:  Current Outpatient Medications  Medication Sig Dispense Refill   folic acid (FOLVITE) 1 MG tablet Take 1 tablet (1 mg total) by mouth daily. 90 tablet 1   lidocaine-prilocaine (EMLA) cream Apply 1 application topically as needed. 30 g 0   metoprolol succinate (TOPROL XL) 25 MG  24 hr tablet Take 1 tablet (25 mg total) by mouth daily. (Patient taking differently: Take 50 mg by mouth daily.) 30 tablet 1   sertraline (ZOLOFT) 50 MG tablet Take 0.5 tablets (25 mg total) by mouth daily. 30 tablet 2   TAGRISSO 80 MG tablet TAKE 1 TABLET (80 MG TOTAL) DAILY 30 tablet 4   apixaban (ELIQUIS) 5 MG TABS tablet Take 1 tablet (5 mg total) by mouth 2 (two) times daily. 60 tablet 2   clindamycin (CLINDAGEL) 1 % gel Apply topically 2 (two) times daily. Apply to affected areas twice a day. (Patient not taking: Reported on 10/20/2021) 30 g 0   dexamethasone (DECADRON) 1 MG tablet Take 1 tablet (1 mg total) by mouth 2 (two) times daily with a meal. (Patient not taking: Reported on 07/28/2021) 60 tablet 1   ondansetron (ZOFRAN) 8 MG tablet One pill every 8 hours as needed for nausea/vomitting. (Patient not taking: Reported on 10/20/2021) 40 tablet 1   prochlorperazine (COMPAZINE) 10 MG tablet Take 1 tablet (10 mg total) by mouth every 6 (six) hours as needed for nausea or vomiting. (Patient not taking: Reported on 05/15/2021) 40 tablet 1   triamcinolone ointment (KENALOG) 0.5 % Apply 1 application topically 2 (two) times daily. (  Patient not taking: Reported on 10/20/2021) 30 g 0   No current facility-administered medications for this visit.   Facility-Administered Medications Ordered in Other Visits  Medication Dose Route Frequency Provider Last Rate Last Admin   heparin lock flush 100 UNIT/ML injection            heparin lock flush 100 UNIT/ML injection            sodium chloride flush (NS) 0.9 % injection 10 mL  10 mL Intravenous PRN [name], MD   10 mL at 07/28/21 0852      .  PHYSICAL EXAMINATION: ECOG PERFORMANCE STATUS: 1 - Symptomatic but completely ambulatory  Vitals:   10/20/21 0948  BP: 113/80  Pulse: 85  Temp: 97.6 F (36.4 C)  SpO2: 97%   Filed Weights   10/20/21 0948  Weight: 233 lb 6.4 oz (105.9 kg)     Physical Exam HENT:     Head: Normocephalic  and atraumatic.     Mouth/Throat:     Pharynx: No oropharyngeal exudate.  Eyes:     Pupils: Pupils are equal, round, and reactive to light.  Cardiovascular:     Rate and Rhythm: Normal rate and regular rhythm.  Pulmonary:     Effort: No respiratory distress.     Breath sounds: No wheezing.     Comments: Decreased breath sound on the left side compared to right. Abdominal:     General: Bowel sounds are normal. There is no distension.     Palpations: Abdomen is soft. There is no mass.     Tenderness: There is no abdominal tenderness. There is no guarding or rebound.  Musculoskeletal:        General: No tenderness. Normal range of motion.     Cervical back: Normal range of motion and neck supple.  Skin:    General: Skin is warm.  Neurological:     Mental Status: He is alert and oriented to person, place, and time.  Psychiatric:        Mood and Affect: Affect normal.   Fungal dermatitis bilateral buttocks groin/underneath abdominal pannus  LABORATORY DATA:  I have reviewed the data as listed Lab Results  Component Value Date   WBC 6.9 10/20/2021   HGB 13.8 10/20/2021   HCT 42.2 10/20/2021   MCV 86.5 10/20/2021   PLT 280 10/20/2021   Recent Labs    08/25/21 0846 09/22/21 0903 10/20/21 0935  NA 137 135 135  K 3.7 3.8 3.6  CL 104 104 105  CO2 '24 23 25  ' GLUCOSE 133* 110* 108*  BUN '9 12 10  ' CREATININE 1.18 1.14 1.24  CALCIUM 8.4* 8.3* 8.3*  GFRNONAA >60 >60 >60  PROT 6.8 6.8 7.5  ALBUMIN 3.4* 3.2* 3.4*  AST '29 26 29  ' ALT 22 22 34  ALKPHOS 91 116 141*  BILITOT 0.4 0.4 0.4    RADIOGRAPHIC STUDIES: I have personally reviewed the radiological images as listed and agreed with the findings in the report. MR BRAIN W WO CONTRAST  Result Date: 10/18/2021 CLINICAL DATA:  Lung cancer with brain metastases. EXAM: MRI HEAD WITHOUT AND WITH CONTRAST TECHNIQUE: Multiplanar, multiecho pulse sequences of the brain and surrounding structures were obtained without and with  intravenous contrast. CONTRAST:  25m GADAVIST GADOBUTROL 1 MMOL/ML IV SOLN COMPARISON:  Head MRI 08/21/2021 FINDINGS: Brain: There is no evidence of an acute infarct, midline shift, or extra-axial fluid collection. The ventricles and sulci are normal. Enhancing brain lesions are all  stable to slightly smaller in size as follows: 1. 3 mm in the right parietal lobe (series 18, image 95). 2. 3 mm in the posterior left frontal lobe (series 18, image 116). 3. 2 mm in the posterior left frontal lobe (series 18, image 118). 4. 2 mm in the right parietal lobe (series 18, image 119). 5. 2 mm in the lateral right frontal lobe (series 18, image 121). 6. 2 mm in the medial right frontal lobe (series 18, image 133). There are chronic blood products associated with some of these lesions. There is no edema. No new enhancing lesions are identified. Vascular: Major intracranial vascular flow voids are preserved. Skull and upper cervical spine: Unremarkable bone marrow signal. Sinuses/Orbits: Unremarkable orbits. Small mucous retention cyst in the left maxillary sinus. Clear mastoid air cells. Other: None. IMPRESSION: Stable to slightly decreased size of six enhancing brain metastases. No evidence of new or progressive disease. Electronically Signed   By: Logan Bores M.D.   On: 10/18/2021 17:19    ASSESSMENT & PLAN:   Cancer of lower lobe of left lung ([place]) # STAGE IV- Left lower lobe lung cancer-adenocarcinoma the lung;  EGFR- 19 del POSITIVE. Currently on osimertinib 80 mg a day [osimertinib [since June 28th, 2022]. Dec 7th, 2022-stable left lower lobe mass/moderate to large left pleural effusion.  Multiple solid/groundglass nodules in the right lung-slightly increased in size concerning for slightly worsening disease [see below-largest nodule up to 1 cm in size]; otherwise no evidence of any metastatic disease in the abdomen pelvis.  Stable bone lesions.  #Given the overall stability of disease/and the fact the patient is  tolerating treatment well-would not recommend making any changes at this time.  Continues Osimetrinib.  We will plan the imaging in March/April.  #Skin rash on the torso-likely secondary to osimertinib.  Improved with Kenalog ointment- STABLE.  # Multiple brain mets subcentimeter asymptomatic [NO RT]-January 2023 brain MRI shows stable/improved brain lesions.  Continue surveillance imaging without any radiation.  #Left lung PE [incidental on CT SEP 13th,2022-]?Symptomatic DVT of the right calf-on Eliquis-STABLE.  #Bone metastases- CT DEc 7th, 2022 to healing process.  Dental evaluation on 9/21.  Continue calcium vitamin D intake. calcium 8.5; ?  Osimertinib. HOLD zometa.   #Bilateral hip pain/groin pain-intermittent not persistent-suspect musculoskeletal rather than malignancy or/others.  If continues to get worse would recommend further imaging.  Recommend exercises as per OT/maureen.   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 STABLE.  # DISPOSITION: # HOLD zometa; de-access # follow up in 4 week-MD; labs- cbc/cmp; possible zometa; - Dr.B    # I reviewed the blood work- with the patient in detail; also reviewed the imaging independently [as summarized above]; and with the patient in detail.                               All questions were answered. The patient knows to call the clinic with any problems, questions or concerns.    [name], MD 10/20/2021 10:25 AM

## 2021-10-26 ENCOUNTER — Other Ambulatory Visit: Payer: Self-pay | Admitting: Specialist

## 2021-10-26 DIAGNOSIS — J90 Pleural effusion, not elsewhere classified: Secondary | ICD-10-CM

## 2021-10-27 ENCOUNTER — Ambulatory Visit: Payer: Managed Care, Other (non HMO)

## 2021-10-31 ENCOUNTER — Ambulatory Visit
Admission: RE | Admit: 2021-10-31 | Discharge: 2021-10-31 | Disposition: A | Discharge status: Home / Self Care | Payer: Managed Care, Other (non HMO) | Source: Ambulatory Visit | Attending: Specialist | Admitting: Specialist

## 2021-10-31 ENCOUNTER — Other Ambulatory Visit: Payer: Self-pay | Admitting: Specialist

## 2021-10-31 ENCOUNTER — Other Ambulatory Visit: Payer: Self-pay

## 2021-10-31 ENCOUNTER — Ambulatory Visit
Admission: RE | Admit: 2021-10-31 | Discharge: 2021-10-31 | Disposition: A | Discharge status: Home / Self Care | Payer: Managed Care, Other (non HMO) | Source: Ambulatory Visit | Attending: Radiology | Admitting: Radiology

## 2021-10-31 DIAGNOSIS — J90 Pleural effusion, not elsewhere classified: Secondary | ICD-10-CM | POA: Diagnosis present

## 2021-10-31 DIAGNOSIS — Z9889 Other specified postprocedural states: Secondary | ICD-10-CM | POA: Insufficient documentation

## 2021-10-31 LAB — GLUCOSE, PLEURAL OR PERITONEAL FLUID: Glucose, Fluid: 29 mg/dL

## 2021-10-31 LAB — BODY FLUID CELL COUNT WITH DIFFERENTIAL
Eos, Fluid: 1 %
Lymphs, Fluid: 76 %
Monocyte-Macrophage-Serous Fluid: 9 %
Neutrophil Count, Fluid: 14 %
Total Nucleated Cell Count, Fluid: 690 cu mm

## 2021-10-31 LAB — AMYLASE, PLEURAL OR PERITONEAL FLUID: Amylase, Fluid: 50 U/L

## 2021-10-31 LAB — PROTEIN, PLEURAL OR PERITONEAL FLUID: Total protein, fluid: <3 g/dL

## 2021-10-31 LAB — LACTATE DEHYDROGENASE, PLEURAL OR PERITONEAL FLUID: LD, Fluid: 289 U/L — ABNORMAL HIGH (ref 3–23)

## 2021-10-31 IMAGING — DX DG CHEST 1V PORT
1 series · 1 of 1 positions shown · non-contrast
Comparison: [DATE]

CLINICAL DATA: Status post left thoracentesis

EXAM:
PORTABLE CHEST 1 VIEW

[chest ap]
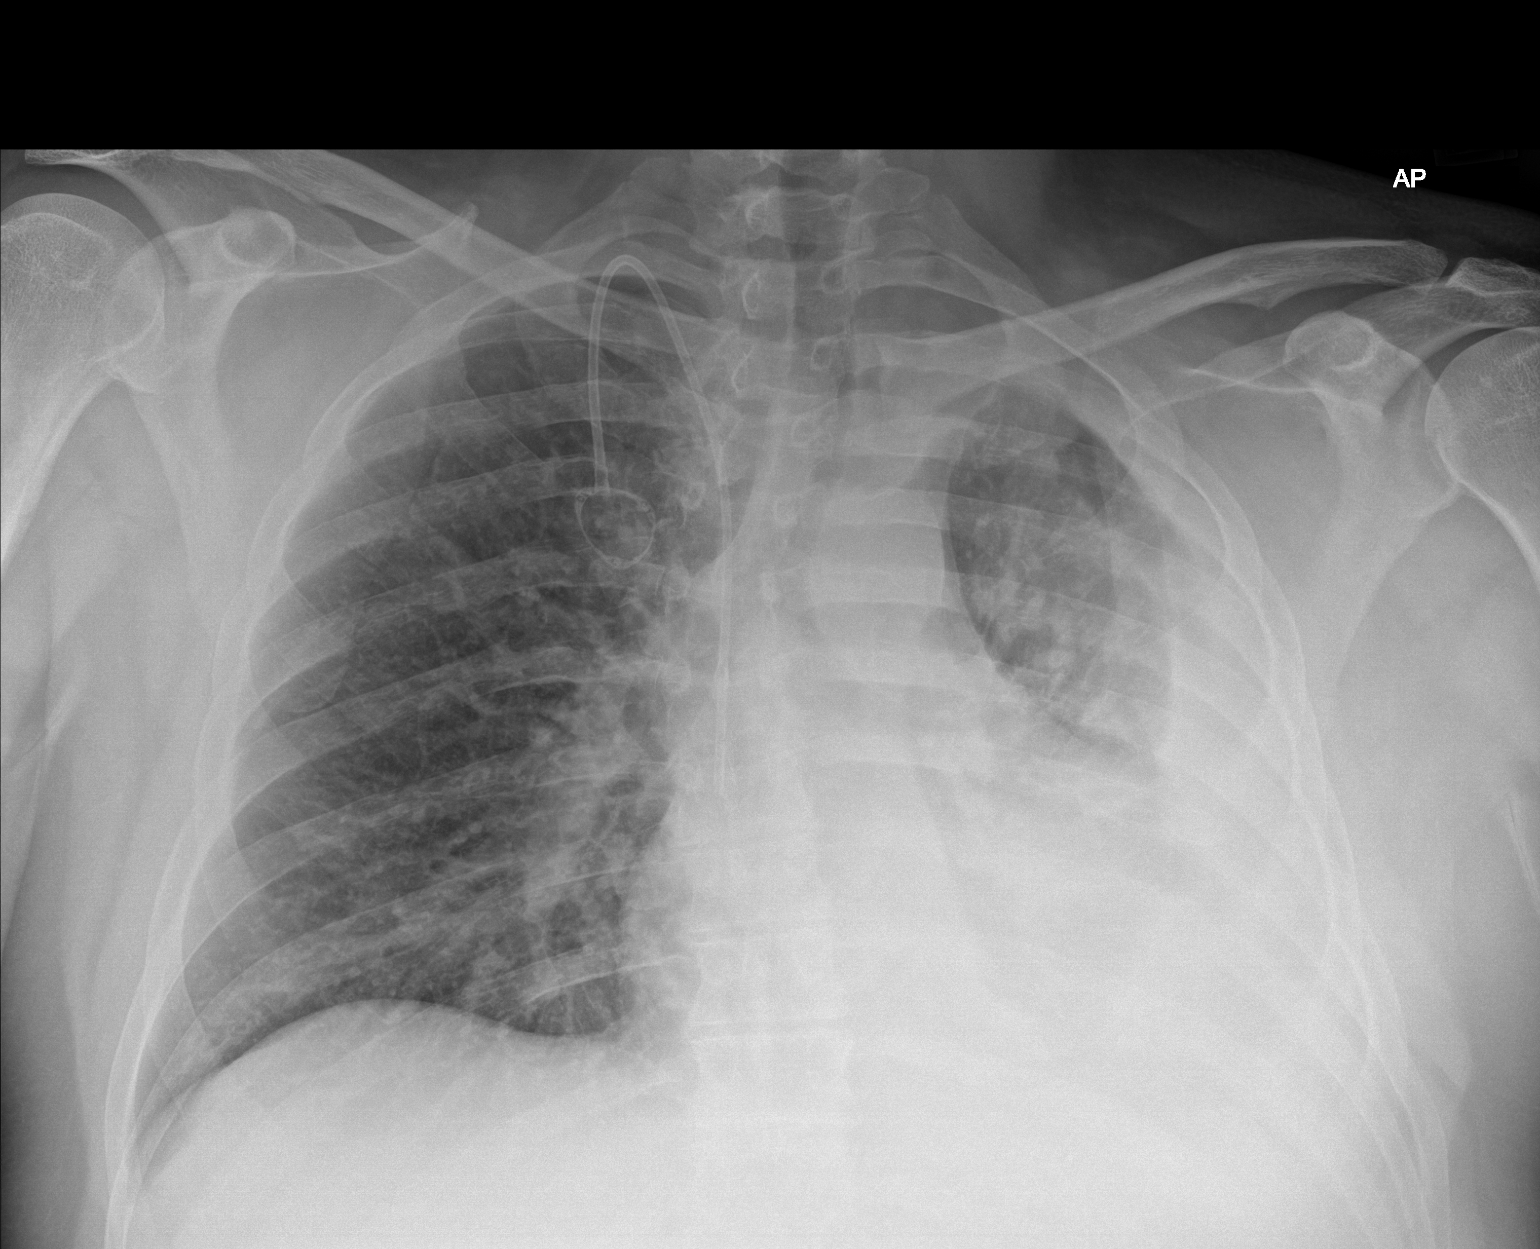

[1 of 1 positions shown; findings below may reference images not displayed]

FINDINGS: Similar appearance of the cardiomediastinal silhouette. There is a
right-sided chest port with tip terminating near the superior
cavoatrial junction. The right lung appears clear. Similar
appearance of large loculated left pleural effusion. No
pneumothorax. No acute osseous abnormality.
IMPRESSION: Similar appearance of the chest with large loculated left pleural
effusion. No pneumothorax.

## 2021-10-31 NOTE — Procedures (Signed)
PROCEDURE SUMMARY:  Successful US guided left thoracentesis. Yielded 550 mL of amber/blood tinged fluid, remaining loculated effusion is seen post procedure. Pt experienced chest pain and shortness of breath and the procedure was stopped early. No immediate complications.  Specimen was sent for labs. CXR ordered.  EBL < 1 mL  Rockney Ghee 10/31/2021 4:22 PM

## 2021-11-01 LAB — PROTEIN, BODY FLUID (OTHER): Total Protein, Body Fluid Other: 2.9 g/dL

## 2021-11-02 LAB — ACID FAST SMEAR (AFB, MYCOBACTERIA): Acid Fast Smear: NEGATIVE

## 2021-11-02 LAB — CYTOLOGY - NON PAP

## 2021-11-03 LAB — CHOLESTEROL, BODY FLUID: Cholesterol, Fluid: 50 mg/dL

## 2021-11-04 LAB — BODY FLUID CULTURE W GRAM STAIN: Culture: NO GROWTH

## 2021-11-17 ENCOUNTER — Inpatient Hospital Stay: Payer: Managed Care, Other (non HMO) | Attending: Internal Medicine

## 2021-11-17 ENCOUNTER — Other Ambulatory Visit: Payer: Self-pay

## 2021-11-17 ENCOUNTER — Inpatient Hospital Stay: Payer: Managed Care, Other (non HMO)

## 2021-11-17 ENCOUNTER — Encounter: Payer: Self-pay | Admitting: Internal Medicine

## 2021-11-17 ENCOUNTER — Inpatient Hospital Stay (HOSPITAL_BASED_OUTPATIENT_CLINIC_OR_DEPARTMENT_OTHER): Payer: Managed Care, Other (non HMO) | Admitting: Internal Medicine

## 2021-11-17 DIAGNOSIS — C7951 Secondary malignant neoplasm of bone: Secondary | ICD-10-CM | POA: Insufficient documentation

## 2021-11-17 DIAGNOSIS — J90 Pleural effusion, not elsewhere classified: Secondary | ICD-10-CM | POA: Diagnosis not present

## 2021-11-17 DIAGNOSIS — C3492 Malignant neoplasm of unspecified part of left bronchus or lung: Secondary | ICD-10-CM

## 2021-11-17 DIAGNOSIS — C3432 Malignant neoplasm of lower lobe, left bronchus or lung: Secondary | ICD-10-CM | POA: Insufficient documentation

## 2021-11-17 DIAGNOSIS — Z452 Encounter for adjustment and management of vascular access device: Secondary | ICD-10-CM | POA: Diagnosis not present

## 2021-11-17 DIAGNOSIS — Z95828 Presence of other vascular implants and grafts: Secondary | ICD-10-CM

## 2021-11-17 DIAGNOSIS — C7931 Secondary malignant neoplasm of brain: Secondary | ICD-10-CM | POA: Diagnosis not present

## 2021-11-17 LAB — CBC WITH DIFFERENTIAL/PLATELET
Abs Immature Granulocytes: 0.03 10*3/uL (ref 0.00–0.07)
Basophils Absolute: 0 10*3/uL (ref 0.0–0.1)
Basophils Relative: 1 %
Eosinophils Absolute: 0.1 10*3/uL (ref 0.0–0.5)
Eosinophils Relative: 2 %
HCT: 41.1 % (ref 39.0–52.0)
Hemoglobin: 13.3 g/dL (ref 13.0–17.0)
Immature Granulocytes: 1 %
Lymphocytes Relative: 20 %
Lymphs Abs: 1.2 10*3/uL (ref 0.7–4.0)
MCH: 27.3 pg (ref 26.0–34.0)
MCHC: 32.4 g/dL (ref 30.0–36.0)
MCV: 84.2 fL (ref 80.0–100.0)
Monocytes Absolute: 0.6 10*3/uL (ref 0.1–1.0)
Monocytes Relative: 10 %
Neutro Abs: 3.8 10*3/uL (ref 1.7–7.7)
Neutrophils Relative %: 66 %
Platelets: 259 10*3/uL (ref 150–400)
RBC: 4.88 MIL/uL (ref 4.22–5.81)
RDW: 13.2 % (ref 11.5–15.5)
WBC: 5.6 10*3/uL (ref 4.0–10.5)
nRBC: 0 % (ref 0.0–0.2)

## 2021-11-17 LAB — COMPREHENSIVE METABOLIC PANEL
ALT: 32 U/L (ref 0–44)
AST: 33 U/L (ref 15–41)
Albumin: 3.3 g/dL — ABNORMAL LOW (ref 3.5–5.0)
Alkaline Phosphatase: 138 U/L — ABNORMAL HIGH (ref 38–126)
Anion gap: 4 — ABNORMAL LOW (ref 5–15)
BUN: 11 mg/dL (ref 6–20)
CO2: 24 mmol/L (ref 22–32)
Calcium: 7.9 mg/dL — ABNORMAL LOW (ref 8.9–10.3)
Chloride: 105 mmol/L (ref 98–111)
Creatinine, Ser: 1.16 mg/dL (ref 0.61–1.24)
GFR, Estimated: >60 mL/min (ref >60)
Glucose, Bld: 125 mg/dL — ABNORMAL HIGH (ref 70–99)
Potassium: 3.5 mmol/L (ref 3.5–5.1)
Sodium: 133 mmol/L — ABNORMAL LOW (ref 135–145)
Total Bilirubin: 0.3 mg/dL (ref 0.3–1.2)
Total Protein: 7.1 g/dL (ref 6.5–8.1)

## 2021-11-17 MED ORDER — HEPARIN SOD (PORK) LOCK FLUSH 10 UNIT/ML IV SOLN
10.0000 [IU] | Freq: Once | INTRAVENOUS | Status: AC
  Administered 2021-11-17: 10 [IU]
  Filled 2021-11-17: qty 1

## 2021-11-17 NOTE — Assessment & Plan Note (Addendum)
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 Currently on osimertinib 80 mg a day [osimertinib [since June 28th, 2022]. Dec 7th, 2022-stable left lower lobe mass/moderate to large left pleural effusion.  Multiple solid/groundglass nodules in the right lung-slightly increased in size concerning for slightly worsening disease [see below-largest nodule up to 1 cm in size]; otherwise no evidence of any metastatic disease in the abdomen pelvis.  Marland Kitchen STABLE. bone lesions.  #Given the overall stability of disease/and the fact the patient is tolerating treatment well-would not recommend making any changes at this time.  Continues Osimetrinib.  We will plan to get imaging-imaging ordered today.  #Left-sided pleural effusion-question reactive [July 2023]-cytology reviewed-negative for malignancy.   #Skin rash on the torso-likely secondary to osimertinib.  Improved with Kenalog ointment- . STABLE.  # Multiple brain mets subcentimeter asymptomatic [NO RT]-January 2023 brain MRI shows stable/improved brain lesions.  Continue surveillance imaging without any radiation.  We will repeat imaging in 3 months or so.  #Left lung PE [incidental on CT SEP 13th,2022-]?Symptomatic DVT of the right calf-on Eliquis-STABLE.  #Bone metastases- CT DEc 7th, 2022 to healing process.  Dental evaluation on 9/21.  Continue calcium vitamin D intake. calcium 8.5; ?  Osimertinib. HOLD zometa.   #Bilateral hip pain/groin pain-intermittent not persistent-suspect musculoskeletal rather than malignancy or/others.  If continues to get worse would recommend further imaging-stable.  Await above imaging.  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 STABLE.  # DISPOSITION: # HOLD zometa; de-access # follow up on MARCH 17th -MD; labs- cbc/cmp; possible zometa; CT scan CAP - Dr.B

## 2021-11-17 NOTE — Progress Notes (Signed)
[facility] CONSULT NOTE  Patient Care Team: Pcp, No as PCP - General [name], RN as Oncology Nurse Navigator [name], MD as Consulting Physician (Internal Medicine)  CHIEF COMPLAINTS/PURPOSE OF CONSULTATION: Lung cancer  #  Oncology History Overview Note  IMPRESSION: 1. Patchy nodular fat stranding throughout the anterior left upper quadrant peritoneal fat, nonspecific, cannot exclude peritoneal carcinomatosis. Dedicated CT abdomen/pelvis with oral and IV contrast recommended for further evaluation. 2. Dense patchy consolidation replacing much of the left lower lung lobe, appearing masslike in the superior segment left lower lobe, with associated bulging of the left major fissure and associated left lower lobe volume loss. Fine nodularity throughout both lungs with an upper lobe predominance. Asymmetric left upper lobe interlobular septal thickening. These findings are indeterminate, with differential including multilobar pneumonia, sarcoidosis or a neoplastic process. The persistence on radiographs back to 01/20/2021 despite antibiotic therapy make sarcoidosis or a neoplastic process more likely. Pulmonology consultation suggested for consideration of bronchoscopic evaluation. 3. Small dependent left pleural effusion. 4. Mild mediastinal lymphadenopathy, nonspecific. 5. Subacute healing lateral right sixth rib fracture.   DIAGNOSIS:  A. LUNG, LEFT LOWER LOBE; ENB-ASSISTED BIOPSY:  - NON-SMALL CELL CARCINOMA, FAVOR ADENOCARCINOMA.  - FOREIGN MATERIAL SUGGESTIVE OF ASPIRATION.   # EGFR MUTATED: 64 del- June 28th, 2022- [name].    Cancer of lower lobe of left lung ([place])  03/23/2021 Initial Diagnosis   Cancer of lower lobe of left lung ([place])   03/29/2021 Cancer Staging   Staging form: Lung, AJCC 8th Edition - Clinical: Stage IVB (cT3, cN3, pM1c) - Signed by [name], MD on 03/29/2021    03/30/2021 -  Chemotherapy     Patient is on Treatment Plan: LUNG NSCLC PEMETREXED (ALIMTA) / CARBOPLATIN Q21D X 1 CYCLES          HISTORY OF PRESENTING ILLNESS: Ambulating independently.  With his wife  [name] 41 y.o.  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  In the interim patient was evaluated by pulmonary, had left-sided thoracentesis.  Patient denies any headaches denies any nausea vomiting.  Continues to have intermittent bilateral groin pain is not any worse.  Denies any worsening skin rash.   Review of Systems  Constitutional:  Positive for malaise/fatigue. Negative for chills, diaphoresis and fever.  HENT:  Negative for nosebleeds and sore throat.   Eyes:  Negative for double vision.  Respiratory:  Negative for wheezing.   Cardiovascular:  Negative for chest pain, palpitations, orthopnea and leg swelling.  Gastrointestinal:  Negative for abdominal pain, blood in stool, diarrhea, heartburn, melena, nausea and vomiting.  Genitourinary:  Negative for dysuria, frequency and urgency.  Musculoskeletal:  Negative for back pain and joint pain.  Skin:  Negative for itching.  Neurological:  Positive for weakness. Negative for dizziness, tingling, focal weakness and headaches.  Endo/Heme/Allergies:  Does not bruise/bleed easily.  Psychiatric/Behavioral:  Negative for depression. The patient is not nervous/anxious and does not have insomnia.     MEDICAL HISTORY:  Past Medical History:  Diagnosis Date   Cancer (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    SURGICAL HISTORY: Past Surgical History:  Procedure Laterality Date   FRACTURE SURGERY     broke femur when he was 6 years   PORTA CATH INSERTION N/A 03/28/2021  Procedure: PORTA CATH INSERTION;  Surgeon: Katha Cabal, MD;  Location: [facility];  Service: Cardiovascular;   Laterality: N/A;   VIDEO BRONCHOSCOPY WITH ENDOBRONCHIAL NAVIGATION N/A 03/15/2021   Procedure: VIDEO BRONCHOSCOPY WITH ENDOBRONCHIAL NAVIGATION;  Surgeon: Ottie Glazier, MD;  Location: [facility];  Service: Thoracic;  Laterality: N/A;   VIDEO BRONCHOSCOPY WITH ENDOBRONCHIAL ULTRASOUND N/A 03/15/2021   Procedure: VIDEO BRONCHOSCOPY WITH ENDOBRONCHIAL ULTRASOUND;  Surgeon: Ottie Glazier, MD;  Location: [facility];  Service: Thoracic;  Laterality: N/A;    SOCIAL HISTORY: Social History   Socioeconomic History   Marital status: Married    Spouse name: [name]   Number of children: 2   Years of education: Not on file   Highest education level: Not on file  Occupational History   Not on file  Tobacco Use   Smoking status: Never   Smokeless tobacco: Current    Types: Snuff, Chew  Vaping Use   Vaping Use: Never used  Substance and Sexual Activity   Alcohol use: Never   Drug use: Never   Sexual activity: Not on file  Other Topics Concern   Not on file  Social History Narrative   [place]; with wife; 2 daughters[12 and 22]; never smoked; rare alcohol. Work in [employer]    Social Determinants of Health   Financial Resource Strain: Not on file  Food Insecurity: Not on file  Transportation Needs: Not on file  Physical Activity: Not on file  Stress: Not on file  Social Connections: Not on file  Intimate Partner Violence: Not on file    FAMILY HISTORY: Family History  Problem Relation Age of Onset   High Cholesterol Mother    Hypertension Father    Cancer Father    Lung cancer Father    Skin cancer Father     ALLERGIES:  is allergic to codeine.  MEDICATIONS:  Current Outpatient Medications  Medication Sig Dispense Refill   apixaban (ELIQUIS) 5 MG TABS tablet Take 1 tablet (5 mg total) by mouth 2 (two) times daily. 60 tablet 2   folic acid (FOLVITE) 1 MG tablet Take 1 tablet (1 mg total) by mouth daily. 90 tablet 1   lidocaine-prilocaine (EMLA) cream Apply 1  application topically as needed. 30 g 0   metoprolol succinate (TOPROL XL) 25 MG 24 hr tablet Take 1 tablet (25 mg total) by mouth daily. (Patient taking differently: Take 50 mg by mouth daily.) 30 tablet 1   TAGRISSO 80 MG tablet TAKE 1 TABLET (80 MG TOTAL) DAILY 30 tablet 4   clindamycin (CLINDAGEL) 1 % gel Apply topically 2 (two) times daily. Apply to affected areas twice a day. (Patient not taking: Reported on 10/20/2021) 30 g 0   dexamethasone (DECADRON) 1 MG tablet Take 1 tablet (1 mg total) by mouth 2 (two) times daily with a meal. (Patient not taking: Reported on 07/28/2021) 60 tablet 1   ondansetron (ZOFRAN) 8 MG tablet One pill every 8 hours as needed for nausea/vomitting. (Patient not taking: Reported on 10/20/2021) 40 tablet 1   prochlorperazine (COMPAZINE) 10 MG tablet Take 1 tablet (10 mg total) by mouth every 6 (six) hours as needed for nausea or vomiting. (Patient not taking: Reported on 05/15/2021) 40 tablet 1   sertraline (ZOLOFT) 50 MG tablet Take 0.5 tablets (25 mg total) by mouth daily. (Patient not taking: Reported on 11/17/2021) 30 tablet 2   triamcinolone ointment (KENALOG) 0.5 % Apply 1 application topically 2 (two) times daily. (Patient  not taking: Reported on 10/20/2021) 30 g 0   No current facility-administered medications for this visit.   Facility-Administered Medications Ordered in Other Visits  Medication Dose Route Frequency Provider Last Rate Last Admin   heparin lock flush 100 UNIT/ML injection            heparin lock flush 100 UNIT/ML injection            sodium chloride flush (NS) 0.9 % injection 10 mL  10 mL Intravenous PRN [name], MD   10 mL at 07/28/21 0852      .  PHYSICAL EXAMINATION: ECOG PERFORMANCE STATUS: 1 - Symptomatic but completely ambulatory  Vitals:   11/17/21 0952  BP: 114/74  Pulse: 73  Resp: 18  Temp: 97.8 F (36.6 C)   Filed Weights   11/17/21 0952  Weight: 235 lb (106.6 kg)     Physical Exam HENT:     Head:  Normocephalic and atraumatic.     Mouth/Throat:     Pharynx: No oropharyngeal exudate.  Eyes:     Pupils: Pupils are equal, round, and reactive to light.  Cardiovascular:     Rate and Rhythm: Normal rate and regular rhythm.  Pulmonary:     Effort: No respiratory distress.     Breath sounds: No wheezing.     Comments: Decreased breath sound on the left side compared to right. Abdominal:     General: Bowel sounds are normal. There is no distension.     Palpations: Abdomen is soft. There is no mass.     Tenderness: There is no abdominal tenderness. There is no guarding or rebound.  Musculoskeletal:        General: No tenderness. Normal range of motion.     Cervical back: Normal range of motion and neck supple.  Skin:    General: Skin is warm.  Neurological:     Mental Status: He is alert and oriented to person, place, and time.  Psychiatric:        Mood and Affect: Affect normal.   Fungal dermatitis bilateral buttocks groin/underneath abdominal pannus  LABORATORY DATA:  I have reviewed the data as listed Lab Results  Component Value Date   WBC 5.6 11/17/2021   HGB 13.3 11/17/2021   HCT 41.1 11/17/2021   MCV 84.2 11/17/2021   PLT 259 11/17/2021   Recent Labs    09/22/21 0903 10/20/21 0935 11/17/21 0916  NA 135 135 133*  K 3.8 3.6 3.5  CL 104 105 105  CO2 '23 25 24  ' GLUCOSE 110* 108* 125*  BUN '12 10 11  ' CREATININE 1.14 1.24 1.16  CALCIUM 8.3* 8.3* 7.9*  GFRNONAA >60 >60 >60  PROT 6.8 7.5 7.1  ALBUMIN 3.2* 3.4* 3.3*  AST 26 29 33  ALT 22 34 32  ALKPHOS 116 141* 138*  BILITOT 0.4 0.4 0.3    RADIOGRAPHIC STUDIES: I have personally reviewed the radiological images as listed and agreed with the findings in the report. MR BRAIN W WO CONTRAST  Result Date: 10/18/2021 CLINICAL DATA:  Lung cancer with brain metastases. EXAM: MRI HEAD WITHOUT AND WITH CONTRAST TECHNIQUE: Multiplanar, multiecho pulse sequences of the brain and surrounding structures were obtained without  and with intravenous contrast. CONTRAST:  52m GADAVIST GADOBUTROL 1 MMOL/ML IV SOLN COMPARISON:  Head MRI 08/21/2021 FINDINGS: Brain: There is no evidence of an acute infarct, midline shift, or extra-axial fluid collection. The ventricles and sulci are normal. Enhancing brain lesions are all stable to slightly  smaller in size as follows: 1. 3 mm in the right parietal lobe (series 18, image 95). 2. 3 mm in the posterior left frontal lobe (series 18, image 116). 3. 2 mm in the posterior left frontal lobe (series 18, image 118). 4. 2 mm in the right parietal lobe (series 18, image 119). 5. 2 mm in the lateral right frontal lobe (series 18, image 121). 6. 2 mm in the medial right frontal lobe (series 18, image 133). There are chronic blood products associated with some of these lesions. There is no edema. No new enhancing lesions are identified. Vascular: Major intracranial vascular flow voids are preserved. Skull and upper cervical spine: Unremarkable bone marrow signal. Sinuses/Orbits: Unremarkable orbits. Small mucous retention cyst in the left maxillary sinus. Clear mastoid air cells. Other: None. IMPRESSION: Stable to slightly decreased size of six enhancing brain metastases. No evidence of new or progressive disease. Electronically Signed   By: Logan Bores M.D.   On: 10/18/2021 17:19   DG Chest Port 1 View  Result Date: 10/31/2021 CLINICAL DATA:  Status post left thoracentesis EXAM: PORTABLE CHEST 1 VIEW COMPARISON:  September 2022 FINDINGS: Similar appearance of the cardiomediastinal silhouette. There is a right-sided chest port with tip terminating near the superior cavoatrial junction. The right lung appears clear. Similar appearance of large loculated left pleural effusion. No pneumothorax. No acute osseous abnormality. IMPRESSION: Similar appearance of the chest with large loculated left pleural effusion. No pneumothorax. Electronically Signed   By: Albin Felling M.D.   On: 10/31/2021 14:40   US  THORACENTESIS ASP PLEURAL SPACE W/IMG GUIDE  Result Date: 10/31/2021 INDICATION: Patient with recurrent malignant left effusion request received for diagnostic and therapeutic thoracentesis EXAM: ULTRASOUND GUIDED LEFT THORACENTESIS MEDICATIONS: Local 1% lidocaine only. COMPLICATIONS: None immediate. PROCEDURE: An ultrasound guided thoracentesis was thoroughly discussed with the patient and questions answered. The benefits, risks, alternatives and complications were also discussed. The patient understands and wishes to proceed with the procedure. Written consent was obtained. Ultrasound was performed to localize and mark an adequate pocket of fluid in the left chest. The area was then prepped and draped in the normal sterile fashion. 1% Lidocaine was used for local anesthesia. Under ultrasound guidance a 19 gauge, 7-cm, Yueh catheter was introduced. A total of 200 mL was removed and due to loculations the fluid stopped, the catheter was removed in its entirety. A new area superior to the previous access area was then prepped and draped in the normal sterile fashion 1% lidocaine was used for local anesthesia and under ultrasound guidance a 19 gauge, 7 cm Yueh catheter was introduced. A total of 350 mL was removed and the procedure was stopped secondary to patient's chest pain and shortness of breath. Thoracentesis was performed. The catheter was removed and a dressing applied. FINDINGS: A total of approximately 550 mL of dark amber colored/blood-tinged fluid was removed. Samples were sent to the laboratory as requested by the clinical team. IMPRESSION: Successful ultrasound guided left thoracentesis yielding 550 mL of pleural fluid. This exam was performed by Tsosie Billing PA-C, and was supervised and interpreted by Dr. Denna Haggard. Electronically Signed   By: Albin Felling M.D.   On: 10/31/2021 16:39    ASSESSMENT & PLAN:   Cancer of lower lobe of left lung ([place]) # STAGE IV- Left lower lobe lung  cancer-adenocarcinoma the lung;  EGFR- 19 del POSITIVE. Currently on osimertinib 80 mg a day [osimertinib [since June 28th, 2022]. Dec 7th, 2022-stable left lower lobe mass/moderate to  large left pleural effusion.  Multiple solid/groundglass nodules in the right lung-slightly increased in size concerning for slightly worsening disease [see below-largest nodule up to 1 cm in size]; otherwise no evidence of any metastatic disease in the abdomen pelvis.  Marland Kitchen STABLE. bone lesions.  #Given the overall stability of disease/and the fact the patient is tolerating treatment well-would not recommend making any changes at this time.  Continues Osimetrinib.  We will plan to get imaging-imaging ordered today.  #Left-sided pleural effusion-question reactive [July 2023]-cytology reviewed-negative for malignancy.   #Skin rash on the torso-likely secondary to osimertinib.  Improved with Kenalog ointment- . STABLE.  # Multiple brain mets subcentimeter asymptomatic [NO RT]-January 2023 brain MRI shows stable/improved brain lesions.  Continue surveillance imaging without any radiation.  We will repeat imaging in 3 months or so.  #Left lung PE [incidental on CT SEP 13th,2022-]?Symptomatic DVT of the right calf-on Eliquis-STABLE.  #Bone metastases- CT DEc 7th, 2022 to healing process.  Dental evaluation on 9/21.  Continue calcium vitamin D intake. calcium 8.5; ?  Osimertinib. HOLD zometa.   #Bilateral hip pain/groin pain-intermittent not persistent-suspect musculoskeletal rather than malignancy or/others.  If continues to get worse would recommend further imaging-stable.  Await above imaging.  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 STABLE.  # DISPOSITION: # HOLD zometa; de-access # follow up on MARCH 17th -MD; labs- cbc/cmp; possible zometa; CT scan CAP - Dr.B                                 All questions were answered. The patient knows to  call the clinic with any problems, questions or concerns.    [name], MD 11/17/2021 11:27 AM

## 2021-11-17 NOTE — Progress Notes (Signed)
Patient denies new problems/concerns today.   °

## 2021-11-22 LAB — MISC LABCORP TEST (SEND OUT): Labcorp test code: 5367

## 2021-12-05 LAB — FUNGAL ORGANISM REFLEX

## 2021-12-05 LAB — FUNGUS CULTURE WITH STAIN

## 2021-12-05 LAB — FUNGUS CULTURE RESULT

## 2021-12-14 LAB — ACID FAST CULTURE WITH REFLEXED SENSITIVITIES (MYCOBACTERIA): Acid Fast Culture: NEGATIVE

## 2021-12-18 ENCOUNTER — Inpatient Hospital Stay: Admission: RE | Admit: 2021-12-18 | Payer: Managed Care, Other (non HMO) | Source: Ambulatory Visit

## 2021-12-22 ENCOUNTER — Ambulatory Visit
Admission: RE | Admit: 2021-12-22 | Discharge: 2021-12-22 | Disposition: A | Discharge status: Home / Self Care | Payer: Managed Care, Other (non HMO) | Source: Ambulatory Visit | Attending: Internal Medicine | Admitting: Internal Medicine

## 2021-12-22 ENCOUNTER — Other Ambulatory Visit: Payer: Self-pay

## 2021-12-22 DIAGNOSIS — C3432 Malignant neoplasm of lower lobe, left bronchus or lung: Secondary | ICD-10-CM

## 2021-12-22 IMAGING — CT CT CHEST-ABD-PELV W/ CM
3 of 5 series · 10 of 36 positions shown, 11 images · IV contrast (agent unspecified)
Comparison: Multiple priors including most recent CT [DATE]
COMPARISON: Multiple priors including most recent CT [DATE]

Addendum:
CLINICAL DATA: History of metastatic non-small cell left lower lobe
lung cancer, restaging ongoing chemotherapy.

EXAM:
CT CHEST, ABDOMEN, AND PELVIS WITH CONTRAST
TECHNIQUE: Multidetector CT imaging of the chest, abdomen and pelvis was
performed following the standard protocol during bolus
administration of intravenous contrast.

[Series 2: axials cap 5.00 · axial · 0.81mm/px · z∈[-1502,-1062]mm · 5 of 134 slices shown, 6 images]
[im 23/134  mediastinal]
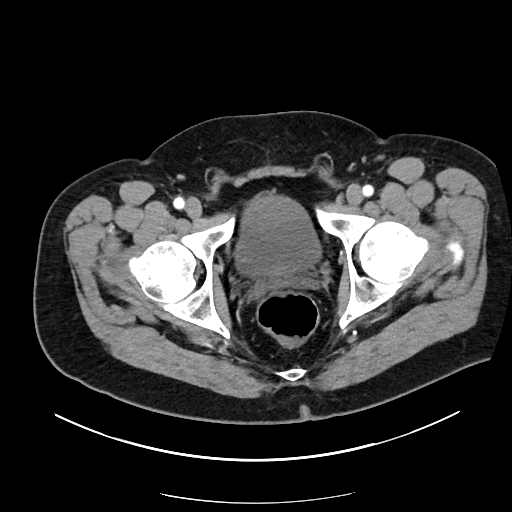
[im 23/134  bone]
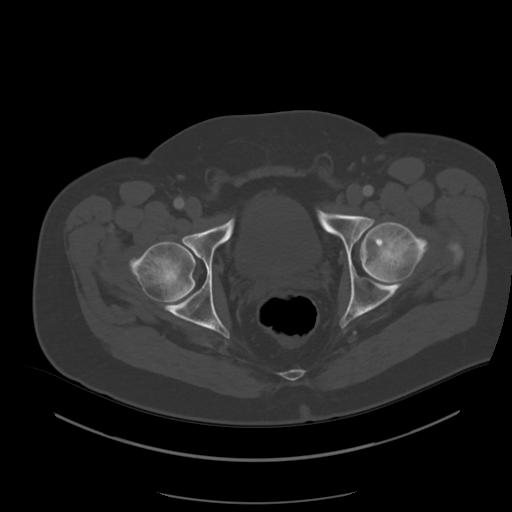
[im 45/134  mediastinal]
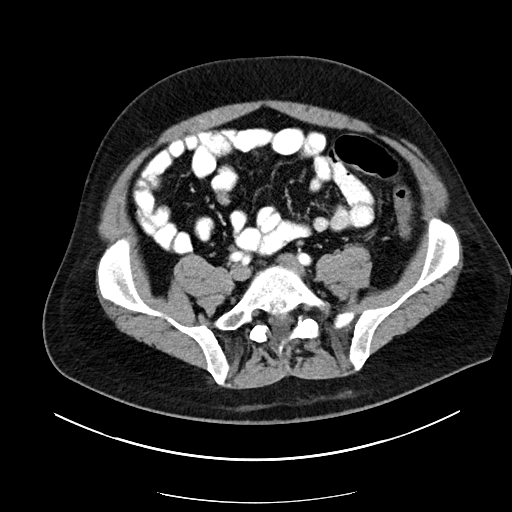
[im 67/134  mediastinal]
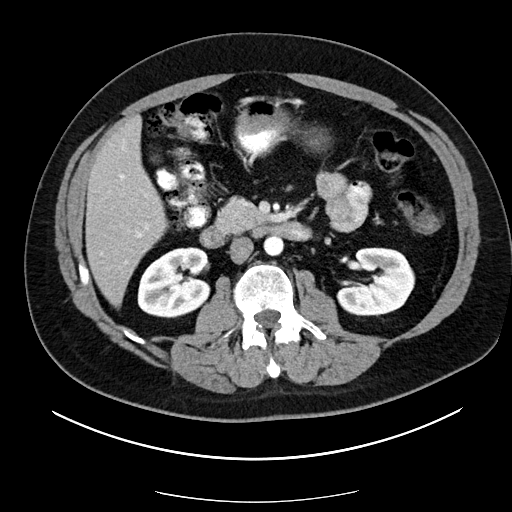
[im 89/134  mediastinal]
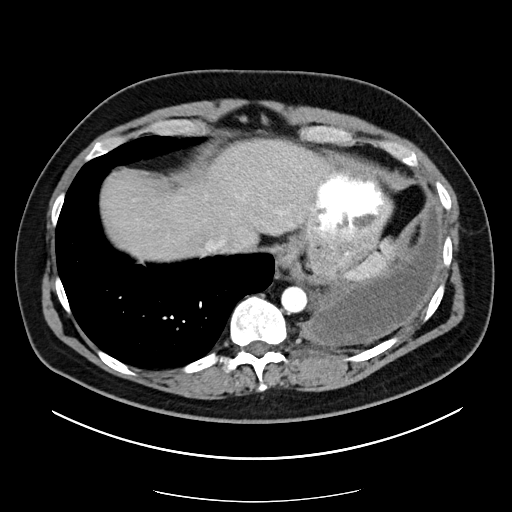
[im 111/134  mediastinal]
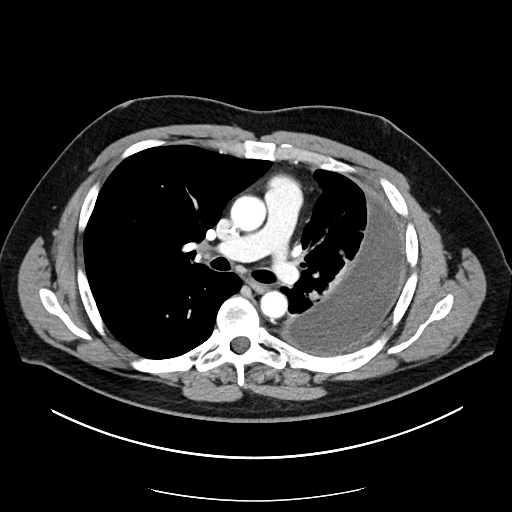

[Series 3: lungs cap 2.00 · axial · 0.81mm/px · z∈[-1541,-1467]mm · 2 of 336 slices shown]
[im 38/336  mediastinal]
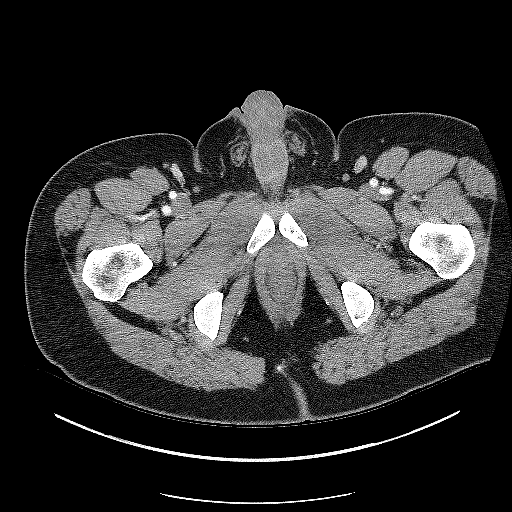
[im 75/336  mediastinal]
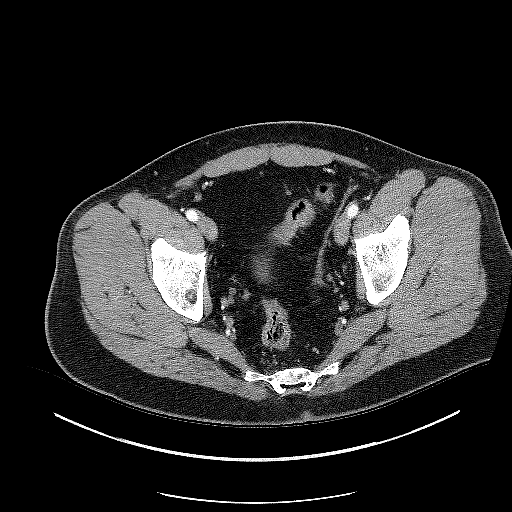

[Series 4: coronals cap 2.00 cor · coronal · 0.80mm/px · 3 of 160 slices shown]
[im 32/160  mediastinal]
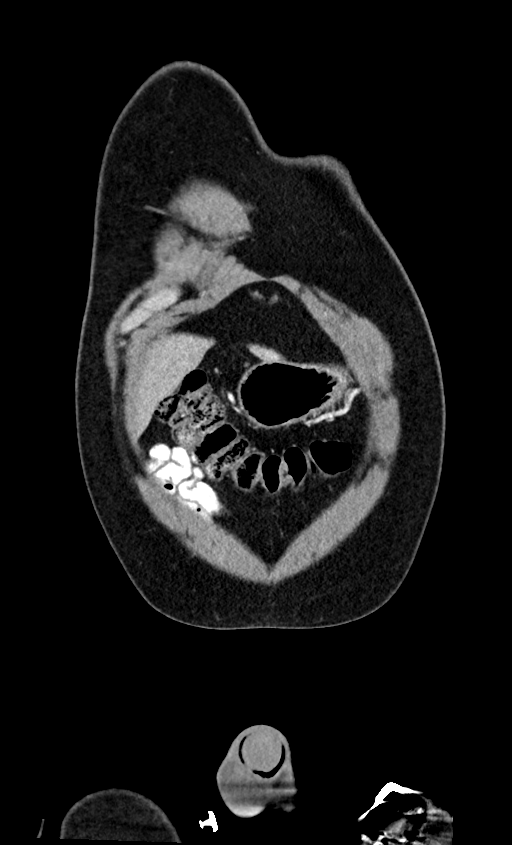
[im 64/160  mediastinal]
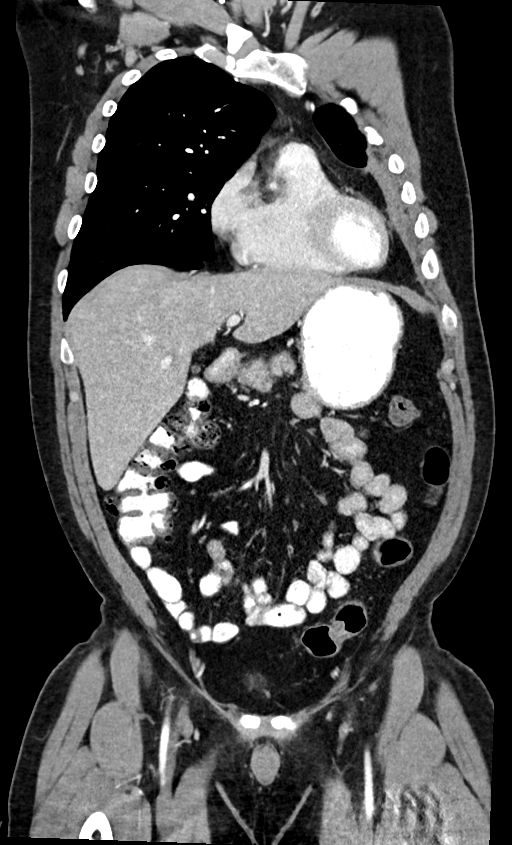
[im 96/160  mediastinal]
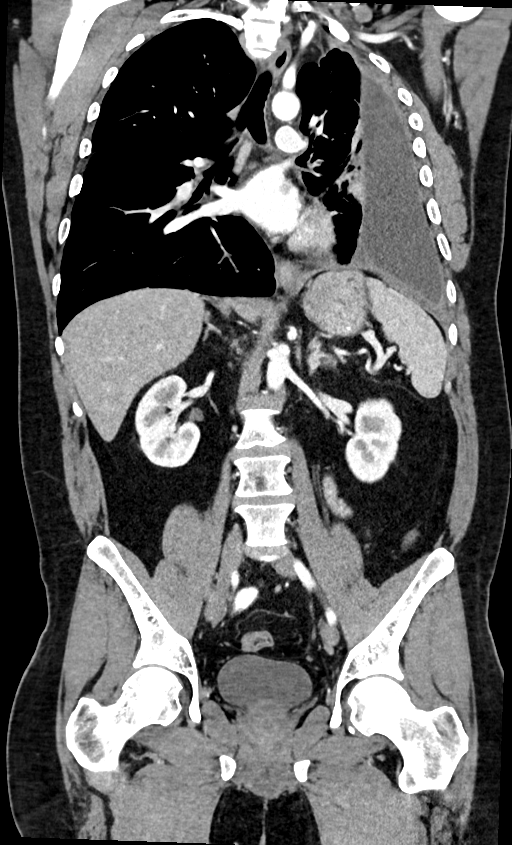

[10 of 36 positions shown; findings below may reference images not displayed]

RADIATION DOSE REDUCTION: This exam was performed according to the
departmental dose-optimization program which includes automated
exposure control, adjustment of the mA and/or kV according to
patient size and/or use of iterative reconstruction technique.

CONTRAST:  100mL OMNIPAQUE IOHEXOL 300 MG/ML  SOLN
FINDINGS: CT CHEST FINDINGS

Cardiovascular: Right chest wall Port-A-Cath with tip at the
superior cavoatrial junction. Normal caliber thoracic aorta. No
central pulmonary embolus on this nondedicated study. Normal size
heart. No significant pericardial effusion/thickening.

Mediastinum/Nodes: No pathologically enlarged mediastinal, hilar or
axillary lymph nodes. No discrete thyroid nodule. The esophagus is
unremarkable.

Lungs/Pleura: No significant interval change in the moderate to
large left pleural effusion with associated smooth pleural
thickening and atelectasis or consolidation.

No significant interval change in post treatment appearance of the
dependent left lower lobe mass which is difficult to accurately
characterize given the adjacent atelectasis/consolidation but
measures approximately 4.4 x 18 cm on image [DATE] previously 4.5 x
1.9 cm.

Again seen are numerous ground-glass and mixed solid and
ground-glass pulmonary nodules throughout the right lung. Previously
index nodules are as follows:

-posterior right middle lobe pulmonary nodule was predominantly a
solid nodule and now is ground-glass in appearance measuring 10 x 8
mm on image 100/3, unchanged in size.

-right middle lobe pulmonary nodule in the medial segment now
measures 5 x 4 mm on image 106/3 previously 8 x 6 mm.

There are few new pulmonary nodules predominantly in the right lower
lobe for instance a new mixed solid and ground-glass 3 mm pulmonary
nodule in the posterior peripheral right lower lobe on image 102/3.

Musculoskeletal: No suspicious chest wall mass.

CT ABDOMEN PELVIS FINDINGS

Hepatobiliary: No suspicious hepatic lesion. Gallbladder is
unremarkable. No biliary ductal dilation.

Pancreas: No pancreatic ductal dilation or evidence of acute
inflammation.

Spleen: No splenomegaly or focal splenic lesion.

Adrenals/Urinary Tract: Adrenal glands are unremarkable. Kidneys are
normal, without renal calculi, solid enhancing lesion, or
hydronephrosis. Bladder is unremarkable for degree of distension.

Stomach/Bowel: Radiopaque enteric contrast material traverses the
ascending colon. Stomach is within normal limits. Appendix appears
normal. No evidence of bowel wall thickening, distention, or
inflammatory changes.

Vascular/Lymphatic: Normal caliber abdominal aorta. No
pathologically enlarged abdominal or pelvic lymph nodes.

Reproductive: Prostate is unremarkable.

Other: No significant abdominopelvic free fluid.

Musculoskeletal: No significant interval change in the numerous
scattered small sclerotic lesions throughout the included osseous
structures. No convincing new aggressive lytic or blastic lesions of
bone.
IMPRESSION: 1. No significant interval change in post treatment appearance of
the dependent left lower lobe mass.
2. No significant interval change in the moderate to large left
pleural effusion with associated smooth pleural thickening and
atelectasis/consolidation, again highly suspicious for malignant
pleural effusion.
3. Right lung pulmonary nodules show a mixed response with overall
decreased or stable size of the index/non indexed nodules previously
visualized but with a few new pulmonary nodules in the right lower
lobe.
4. No significant interval change in the numerous scattered small
sclerotic lesions throughout the included osseous structures,
consistent with treated osseous metastases.
5. No evidence of soft tissue metastatic disease within the abdomen
or pelvis.

ADDENDUM:
Dictation error in the findings section of the report stating that
the left lower lobe mass measures 4.4 x 18 cm this should state of
the mass measures 4.4 x 1.8 cm.

*** End of Addendum ***
RADIATION DOSE REDUCTION: This exam was performed according to the
departmental dose-optimization program which includes automated
exposure control, adjustment of the mA and/or kV according to
patient size and/or use of iterative reconstruction technique.

CONTRAST:  100mL OMNIPAQUE IOHEXOL 300 MG/ML  SOLN
FINDINGS: CT CHEST FINDINGS

Cardiovascular: Right chest wall Port-A-Cath with tip at the
superior cavoatrial junction. Normal caliber thoracic aorta. No
central pulmonary embolus on this nondedicated study. Normal size
heart. No significant pericardial effusion/thickening.

Mediastinum/Nodes: No pathologically enlarged mediastinal, hilar or
axillary lymph nodes. No discrete thyroid nodule. The esophagus is
unremarkable.

Lungs/Pleura: No significant interval change in the moderate to
large left pleural effusion with associated smooth pleural
thickening and atelectasis or consolidation.

No significant interval change in post treatment appearance of the
dependent left lower lobe mass which is difficult to accurately
characterize given the adjacent atelectasis/consolidation but
measures approximately 4.4 x 18 cm on image [DATE] previously 4.5 x
1.9 cm.

Again seen are numerous ground-glass and mixed solid and
ground-glass pulmonary nodules throughout the right lung. Previously
index nodules are as follows:

-posterior right middle lobe pulmonary nodule was predominantly a
solid nodule and now is ground-glass in appearance measuring 10 x 8
mm on image 100/3, unchanged in size.

-right middle lobe pulmonary nodule in the medial segment now
measures 5 x 4 mm on image 106/3 previously 8 x 6 mm.

There are few new pulmonary nodules predominantly in the right lower
lobe for instance a new mixed solid and ground-glass 3 mm pulmonary
nodule in the posterior peripheral right lower lobe on image 102/3.

Musculoskeletal: No suspicious chest wall mass.

CT ABDOMEN PELVIS FINDINGS

Hepatobiliary: No suspicious hepatic lesion. Gallbladder is
unremarkable. No biliary ductal dilation.

Pancreas: No pancreatic ductal dilation or evidence of acute
inflammation.

Spleen: No splenomegaly or focal splenic lesion.

Adrenals/Urinary Tract: Adrenal glands are unremarkable. Kidneys are
normal, without renal calculi, solid enhancing lesion, or
hydronephrosis. Bladder is unremarkable for degree of distension.

Stomach/Bowel: Radiopaque enteric contrast material traverses the
ascending colon. Stomach is within normal limits. Appendix appears
normal. No evidence of bowel wall thickening, distention, or
inflammatory changes.

Vascular/Lymphatic: Normal caliber abdominal aorta. No
pathologically enlarged abdominal or pelvic lymph nodes.

Reproductive: Prostate is unremarkable.

Other: No significant abdominopelvic free fluid.

Musculoskeletal: No significant interval change in the numerous
scattered small sclerotic lesions throughout the included osseous
structures. No convincing new aggressive lytic or blastic lesions of
bone.
IMPRESSION: 1. No significant interval change in post treatment appearance of
the dependent left lower lobe mass.
2. No significant interval change in the moderate to large left
pleural effusion with associated smooth pleural thickening and
atelectasis/consolidation, again highly suspicious for malignant
pleural effusion.
3. Right lung pulmonary nodules show a mixed response with overall
decreased or stable size of the index/non indexed nodules previously
visualized but with a few new pulmonary nodules in the right lower
lobe.
4. No significant interval change in the numerous scattered small
sclerotic lesions throughout the included osseous structures,
consistent with treated osseous metastases.
5. No evidence of soft tissue metastatic disease within the abdomen
or pelvis.

## 2021-12-22 MED ORDER — IOHEXOL 300 MG/ML  SOLN
100.0000 mL | Freq: Once | INTRAMUSCULAR | Status: AC | PRN
  Administered 2021-12-22: 100 mL via INTRAVENOUS

## 2021-12-26 NOTE — Progress Notes (Signed)
Unable to reach the patient. I left a voicemail detailing that imaging is fairly stable. Also await correction of the report from Radiology. Will go in more detail at the next visit.

## 2021-12-29 ENCOUNTER — Encounter: Payer: Self-pay | Admitting: Internal Medicine

## 2021-12-29 ENCOUNTER — Inpatient Hospital Stay: Payer: Managed Care, Other (non HMO) | Attending: Internal Medicine

## 2021-12-29 ENCOUNTER — Inpatient Hospital Stay: Payer: Managed Care, Other (non HMO)

## 2021-12-29 ENCOUNTER — Inpatient Hospital Stay (HOSPITAL_BASED_OUTPATIENT_CLINIC_OR_DEPARTMENT_OTHER): Payer: Managed Care, Other (non HMO) | Admitting: Internal Medicine

## 2021-12-29 ENCOUNTER — Other Ambulatory Visit: Payer: Self-pay

## 2021-12-29 DIAGNOSIS — C7931 Secondary malignant neoplasm of brain: Secondary | ICD-10-CM | POA: Diagnosis not present

## 2021-12-29 DIAGNOSIS — C3432 Malignant neoplasm of lower lobe, left bronchus or lung: Secondary | ICD-10-CM | POA: Insufficient documentation

## 2021-12-29 DIAGNOSIS — Z452 Encounter for adjustment and management of vascular access device: Secondary | ICD-10-CM | POA: Insufficient documentation

## 2021-12-29 DIAGNOSIS — C7951 Secondary malignant neoplasm of bone: Secondary | ICD-10-CM | POA: Insufficient documentation

## 2021-12-29 DIAGNOSIS — C3492 Malignant neoplasm of unspecified part of left bronchus or lung: Secondary | ICD-10-CM

## 2021-12-29 DIAGNOSIS — R21 Rash and other nonspecific skin eruption: Secondary | ICD-10-CM | POA: Insufficient documentation

## 2021-12-29 LAB — COMPREHENSIVE METABOLIC PANEL
ALT: 34 U/L (ref 0–44)
AST: 34 U/L (ref 15–41)
Albumin: 3.3 g/dL — ABNORMAL LOW (ref 3.5–5.0)
Alkaline Phosphatase: 143 U/L — ABNORMAL HIGH (ref 38–126)
Anion gap: 5 (ref 5–15)
BUN: 13 mg/dL (ref 6–20)
CO2: 25 mmol/L (ref 22–32)
Calcium: 8.1 mg/dL — ABNORMAL LOW (ref 8.9–10.3)
Chloride: 103 mmol/L (ref 98–111)
Creatinine, Ser: 1.13 mg/dL (ref 0.61–1.24)
GFR, Estimated: >60 mL/min (ref >60)
Glucose, Bld: 118 mg/dL — ABNORMAL HIGH (ref 70–99)
Potassium: 3.7 mmol/L (ref 3.5–5.1)
Sodium: 133 mmol/L — ABNORMAL LOW (ref 135–145)
Total Bilirubin: 0.2 mg/dL — ABNORMAL LOW (ref 0.3–1.2)
Total Protein: 7.1 g/dL (ref 6.5–8.1)

## 2021-12-29 LAB — CBC WITH DIFFERENTIAL/PLATELET
Abs Immature Granulocytes: 0.03 10*3/uL (ref 0.00–0.07)
Basophils Absolute: 0 10*3/uL (ref 0.0–0.1)
Basophils Relative: 1 %
Eosinophils Absolute: 0.1 10*3/uL (ref 0.0–0.5)
Eosinophils Relative: 2 %
HCT: 41.5 % (ref 39.0–52.0)
Hemoglobin: 13.4 g/dL (ref 13.0–17.0)
Immature Granulocytes: 1 %
Lymphocytes Relative: 22 %
Lymphs Abs: 1.2 10*3/uL (ref 0.7–4.0)
MCH: 27.1 pg (ref 26.0–34.0)
MCHC: 32.3 g/dL (ref 30.0–36.0)
MCV: 84 fL (ref 80.0–100.0)
Monocytes Absolute: 0.7 10*3/uL (ref 0.1–1.0)
Monocytes Relative: 12 %
Neutro Abs: 3.5 10*3/uL (ref 1.7–7.7)
Neutrophils Relative %: 62 %
Platelets: 243 10*3/uL (ref 150–400)
RBC: 4.94 MIL/uL (ref 4.22–5.81)
RDW: 14.6 % (ref 11.5–15.5)
WBC: 5.5 10*3/uL (ref 4.0–10.5)
nRBC: 0 % (ref 0.0–0.2)

## 2021-12-29 MED ORDER — HEPARIN SOD (PORK) LOCK FLUSH 100 UNIT/ML IV SOLN
500.0000 [IU] | Freq: Once | INTRAVENOUS | Status: AC
  Administered 2021-12-29: 500 [IU] via INTRAVENOUS
  Filled 2021-12-29: qty 5

## 2021-12-29 NOTE — Assessment & Plan Note (Addendum)
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 Currently on osimertinib 80 mg a day [osimertinib [since June 28th, 2022]. MARCH, 11th 2023-  No significant interval change in post treatment appearance of ?the dependent left lower lobe mass; /large pleural effusion; Right middle lobe-waxing/waning sub-cm lung nodules; bone-patient stable [see below].  Continue osimertinib for now. ? ?#  Elevated Alkaline phosphatase-/Bilateral hip pain/groin pain-intermittent not persistent-suspect musculoskeletal vs-progressive bone metastasis.  Recommend a PET scan for further evaluation/bone scan.  I have informed nurse practitioner to follow-up when available.  If oligometastatic/progression-consider radiation; continue osimertinib.  ? ?#Left-sided pleural effusion-question reactive [July 2022]-vs malignancy; follows up with Dr. Raul Del; monitor for now. ? ?#Skin rash on the torso-likely secondary to osimertinib.  Improved with Kenalog ointment- STABLE. ? ?# Multiple brain mets subcentimeter asymptomatic [NO RT]-January 2023 brain MRI shows stable/improved brain lesions.  Continue surveillance imaging without any radiation.  We will repeat imaging in 1 month/next visit.  ? ?#Left lung PE [incidental on CT SEP 13th,2022-]?Symptomatic DVT of the right calf-on Eliquis-STABLE. ? ?#Bone metastases- CT DEc 7th, 2022 to healing process.  Dental evaluation on 9/21.  Continue calcium vitamin D intake. calcium 8.1; ?  Osimertinib. HOLD zometa.  ? 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 STABLE. ? ?# DISPOSITION: ?# HOLD zometa;  ?# PET scan- ?# follow up in 5 weeks -MD; labs- cbc/cmp; possible zometa;  - Dr.B   ? ?# I reviewed the blood work- with the patient in detail; also reviewed the imaging independently [as summarized above]; and with the patient in detail.   ?                ? ? ? ?

## 2021-12-29 NOTE — Progress Notes (Signed)
[name] ?CONSULT NOTE ? ?Patient Care Team: ?Pcp, No as PCP - General ?[name], RN as Oncology Nurse Navigator ?[name], MD as Consulting Physician (Internal Medicine) ? ?CHIEF COMPLAINTS/PURPOSE OF CONSULTATION: Lung cancer ? ?#  ?Oncology History Overview Note  ?IMPRESSION: ?1. Patchy nodular fat stranding throughout the anterior left upper ?quadrant peritoneal fat, nonspecific, cannot exclude peritoneal ?carcinomatosis. Dedicated CT abdomen/pelvis with oral and IV ?contrast recommended for further evaluation. ?2. Dense patchy consolidation replacing much of the left lower lung ?lobe, appearing masslike in the superior segment left lower lobe, ?with associated bulging of the left major fissure and associated ?left lower lobe volume loss. Fine nodularity throughout both lungs ?with an upper lobe predominance. Asymmetric left upper lobe ?interlobular septal thickening. These findings are indeterminate, ?with differential including multilobar pneumonia, sarcoidosis or a ?neoplastic process. The persistence on radiographs back to ?01/20/2021 despite antibiotic therapy make sarcoidosis or a ?neoplastic process more likely. Pulmonology consultation suggested ?for consideration of bronchoscopic evaluation. ?3. Small dependent left pleural effusion. ?4. Mild mediastinal lymphadenopathy, nonspecific. ?5. Subacute healing lateral right sixth rib fracture. ?  ?DIAGNOSIS:  ?A. LUNG, LEFT LOWER LOBE; ENB-ASSISTED BIOPSY:  ?- NON-SMALL CELL CARCINOMA, FAVOR ADENOCARCINOMA.  ?- FOREIGN MATERIAL SUGGESTIVE OF ASPIRATION.  ? ?# EGFR MUTATED: 16 del- June 28th, 2022- [name].  ?  ?Cancer of lower lobe of left lung ([place])  ?03/23/2021 Initial Diagnosis  ? Cancer of lower lobe of left lung [facility]) ?  ?03/29/2021 Cancer Staging  ? Staging form: Lung, AJCC 8th Edition ?- Clinical: Stage IVB (cT3, cN3, pM1c) - Signed by [name], MD on 03/29/2021 ? ?  ?03/30/2021 -  Chemotherapy  ?   Patient is on Treatment Plan: LUNG NSCLC PEMETREXED (ALIMTA) / CARBOPLATIN Q21D X 1 CYCLES ? ?  ? ?  ? ? ? ?HISTORY OF PRESENTING ILLNESS: Ambulating independently.  With his wife ? ?[name] 41 y.o.  male patient with stage IV lung cancer adenocarcinoma-brain mets [no WBRT]/bone mets EGFR mutated on osimertinib; metastatic to brain [no WBRT] is here for follow-up/review results of the CT scan. ? ?Continues to have intermittent bilateral groin pain is not any worse. Patient denies any headaches denies any nausea vomiting.   Denies any worsening skin rash. ? ? ?Review of Systems  ?Constitutional:  Positive for malaise/fatigue. Negative for chills, diaphoresis and fever.  ?HENT:  Negative for nosebleeds and sore throat.   ?Eyes:  Negative for double vision.  ?Respiratory:  Negative for wheezing.   ?Cardiovascular:  Negative for chest pain, palpitations, orthopnea and leg swelling.  ?Gastrointestinal:  Negative for abdominal pain, blood in stool, diarrhea, heartburn, melena, nausea and vomiting.  ?Genitourinary:  Negative for dysuria, frequency and urgency.  ?Musculoskeletal:  Negative for back pain and joint pain.  ?Skin:  Negative for itching.  ?Neurological:  Positive for weakness. Negative for dizziness, tingling, focal weakness and headaches.  ?Endo/Heme/Allergies:  Does not bruise/bleed easily.  ?Psychiatric/Behavioral:  Negative for depression. The patient is not nervous/anxious and does not have insomnia.    ? ?MEDICAL HISTORY:  ?Past Medical History:  ?Diagnosis Date  ? Cancer [facility])   ? Dyspnea   ? Heartburn   ? Pneumonia   ? Wolff-Parkinson-White (WPW) syndrome   ? 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 ? ?SURGICAL HISTORY: ?Past Surgical History:  ?Procedure Laterality Date  ? FRACTURE SURGERY    ? broke femur when he was 6 years  ? PORTA CATH INSERTION N/A 03/28/2021  ? Procedure: PORTA  CATH INSERTION;  Surgeon: Katha Cabal, MD;  Location: [place] [facility];  Service: Cardiovascular;  Laterality: N/A;  ? VIDEO  BRONCHOSCOPY WITH ENDOBRONCHIAL NAVIGATION N/A 03/15/2021  ? Procedure: VIDEO BRONCHOSCOPY WITH ENDOBRONCHIAL NAVIGATION;  Surgeon: Ottie Glazier, MD;  Location: [facility];  Service: Thoracic;  Laterality: N/A;  ? VIDEO BRONCHOSCOPY WITH ENDOBRONCHIAL ULTRASOUND N/A 03/15/2021  ? Procedure: VIDEO BRONCHOSCOPY WITH ENDOBRONCHIAL ULTRASOUND;  Surgeon: Ottie Glazier, MD;  Location: [facility] OR[facility];  Service: Thoracic;  Laterality: N/A;  ? ? ?SOCIAL HISTORY: ?Social History  ? ?Socioeconomic History  ? Marital status: Married  ?  Spouse name: [name]  ? Number of children: 2  ? Years of education: Not on file  ? Highest education level: Not on file  ?Occupational History  ? Not on file  ?Tobacco Use  ? Smoking status: Never  ? Smokeless tobacco: Current  ?  Types: Snuff, Chew  ?Vaping Use  ? Vaping Use: Never used  ?Substance and Sexual Activity  ? Alcohol use: Never  ? Drug use: Never  ? Sexual activity: Not on file  ?Other Topics Concern  ? Not on file  ?Social History Narrative  ? [place]; with wife; 2 daughters[12 and 22]; never smoked; rare alcohol. Work in saw [employer]   ? ?Social Determinants of Health  ? ?Financial Resource Strain: Not on file  ?Food Insecurity: Not on file  ?Transportation Needs: Not on file  ?Physical Activity: Not on file  ?Stress: Not on file  ?Social Connections: Not on file  ?Intimate Partner Violence: Not on file  ? ? ?FAMILY HISTORY: ?Family History  ?Problem Relation Age of Onset  ? High Cholesterol Mother   ? Hypertension Father   ? Cancer Father   ? Lung cancer Father   ? Skin cancer Father   ? ? ?ALLERGIES:  is allergic to codeine. ? ?MEDICATIONS:  ?Current Outpatient Medications  ?Medication Sig Dispense Refill  ? apixaban (ELIQUIS) 5 MG TABS tablet Take 1 tablet (5 mg total) by mouth 2 (two) times daily. 60 tablet 2  ? folic acid (FOLVITE) 1 MG tablet Take 1 tablet (1 mg total) by mouth daily. 90 tablet 1  ? lidocaine-prilocaine (EMLA) cream Apply 1 application topically as needed.  30 g 0  ? metoprolol succinate (TOPROL XL) 25 MG 24 hr tablet Take 1 tablet (25 mg total) by mouth daily. (Patient taking differently: Take 50 mg by mouth daily.) 30 tablet 1  ? sertraline (ZOLOFT) 50 MG tablet Take 0.5 tablets (25 mg total) by mouth daily. 30 tablet 2  ? TAGRISSO 80 MG tablet TAKE 1 TABLET (80 MG TOTAL) DAILY 30 tablet 4  ? ondansetron (ZOFRAN) 8 MG tablet One pill every 8 hours as needed for nausea/vomitting. (Patient not taking: Reported on 10/20/2021) 40 tablet 1  ? prochlorperazine (COMPAZINE) 10 MG tablet Take 1 tablet (10 mg total) by mouth every 6 (six) hours as needed for nausea or vomiting. (Patient not taking: Reported on 05/15/2021) 40 tablet 1  ? ?No current facility-administered medications for this visit.  ? ?Facility-Administered Medications Ordered in Other Visits  ?Medication Dose Route Frequency Provider Last Rate Last Admin  ? heparin lock flush 100 UNIT/ML injection           ? heparin lock flush 100 UNIT/ML injection           ? sodium chloride flush (NS) 0.9 % injection 10 mL  10 mL Intravenous PRN [name], MD   10  mL at 07/28/21 0852  ? ? ?  ?. ? ?PHYSICAL EXAMINATION: ?ECOG PERFORMANCE STATUS: 1 - Symptomatic but completely ambulatory ? ?Vitals:  ? 12/29/21 1310  ?BP: 119/83  ?Pulse: 64  ?Temp: (!) 97.1 ?F (36.2 ?C)  ?SpO2: 99%  ? ?Filed Weights  ? 12/29/21 1310  ?Weight: 229 lb 11.2 oz (104.2 kg)  ? ? ? ?Physical Exam ?HENT:  ?   Head: Normocephalic and atraumatic.  ?   Mouth/Throat:  ?   Pharynx: No oropharyngeal exudate.  ?Eyes:  ?   Pupils: Pupils are equal, round, and reactive to light.  ?Cardiovascular:  ?   Rate and Rhythm: Normal rate and regular rhythm.  ?Pulmonary:  ?   Effort: No respiratory distress.  ?   Breath sounds: No wheezing.  ?   Comments: Decreased breath sound on the left side compared to right. ?Abdominal:  ?   General: Bowel sounds are normal. There is no distension.  ?   Palpations: Abdomen is soft. There is no mass.  ?   Tenderness: There  is no abdominal tenderness. There is no guarding or rebound.  ?Musculoskeletal:     ?   General: No tenderness. Normal range of motion.  ?   Cervical back: Normal range of motion and neck supple.  ?

## 2022-01-02 ENCOUNTER — Other Ambulatory Visit: Payer: Self-pay | Admitting: *Deleted

## 2022-01-02 DIAGNOSIS — C3492 Malignant neoplasm of unspecified part of left bronchus or lung: Secondary | ICD-10-CM

## 2022-01-02 NOTE — Telephone Encounter (Signed)
CBC with Differential ?Order: [id number] ?Status: Final result    ?Visible to patient: Yes (seen)    ?Next appt: 01/17/2022 at 10:00 AM in Radiology [facility] PET)    ?Dx: Non-small cell cancer of left lung ([place])    ?0 Result Notes ?          ?Component Ref Range & Units 4 d ago ?(12/29/21) 1 mo ago ?(11/17/21) 2 mo ago ?(10/20/21) 3 mo ago ?(09/22/21) 4 mo ago ?(08/25/21) 5 mo ago ?(07/28/21) 5 mo ago ?(07/14/21)  ?WBC 4.0 - 10.5 K/uL 5.5  5.6  6.9  6.1  7.0  9.0  8.3   ?RBC 4.22 - 5.81 MIL/uL 4.94  4.88  4.88  4.49  4.32  4.47  4.81   ?Hemoglobin 13.0 - 17.0 g/dL 13.4  13.3  13.8  13.4  13.4  14.0  15.4   ?HCT 39.0 - 52.0 % 41.5  41.1  42.2  40.7  40.1  42.9  45.9   ?MCV 80.0 - 100.0 fL 84.0  84.2  86.5  90.6  92.8  96.0  95.4   ?MCH 26.0 - 34.0 pg 27.1  27.3  28.3  29.8  31.0  31.3  32.0   ?MCHC 30.0 - 36.0 g/dL 32.3  32.4  32.7  32.9  33.4  32.6  33.6   ?RDW 11.5 - 15.5 % 14.6  13.2  12.5  12.1  12.7  14.7  16.1 High    ?Platelets 150 - 400 K/uL 243  259  280  238  275  305  178   ?nRBC 0.0 - 0.2 % 0.0  0.0  0.0  0.0  0.0  0.0  0.0   ?Neutrophils Relative % % 62  66  67  63  64  56  72   ?Neutro Abs 1.7 - 7.7 K/uL 3.5  3.8  4.6  3.9  4.5  5.0  6.0   ?Lymphocytes Relative % 22  20  19  21  22  24  13    ?Lymphs Abs 0.7 - 4.0 K/uL 1.2  1.2  1.3  1.3  1.6  2.2  1.1   ?Monocytes Relative % 12  10  10  11  10  13  9    ?Monocytes Absolute 0.1 - 1.0 K/uL 0.7  0.6  0.7  0.7  0.7  1.2 High   0.8   ?Eosinophils Relative % 2  2  3  3  2  2  3    ?Eosinophils Absolute 0.0 - 0.5 K/uL 0.1  0.1  0.2  0.2  0.1  0.2  0.2   ?Basophils Relative % 1  1  1  1  1  1  1    ?Basophils Absolute 0.0 - 0.1 K/uL 0.0  0.0  0.0  0.1  0.1  0.1  0.1   ?Immature Granulocytes % 1  1  0  1  1  4  2    ?Abs Immature Granulocytes 0.00 - 0.07 K/uL 0.03  0.03 CM  0.03 CM  0.03 CM  0.05 CM  0.38 High  CM  0.13 High  CM   ?Comment: Performed at [facility], [address] [place], [place] [address]  ?Resulting Agency  [place] [facility] [place] [facility] [place] [facility] [place]  [facility] [place] [facility] [place] [facility] [place] [facility]  ?  ? ?  ?  ?Specimen Collected: 12/29/21 12:46 Last Resulted: 12/29/21 13:10  ?  ?  Lab Flowsheet   ? Order Details   ? View Encounter   ? Lab and Collection Details   ? Routing   ? Result History    ?View Encounter Conversation    ?  ?CM=Additional comments    ?  ?Result Care Coordination ? ? ?Patient Communication ? ? Add Comments   Seen Back to Top  ?  ?  ? ?Other Results from 12/29/2021 ? ? Contains abnormal data Comprehensive metabolic panel ?Order: [id number] ?Status: Final result    ?Visible to patient: Yes (seen)    ?Next appt: 01/17/2022 at 10:00 AM in Radiology [facility] PET)    ?Dx: Non-small cell cancer of left lung ([place])    ?0 Result Notes ?          ?Component Ref Range & Units 4 d ago ?(12/29/21) 1 mo ago ?(11/17/21) 2 mo ago ?(10/20/21) 3 mo ago ?(09/22/21) 4 mo ago ?(08/25/21) 5 mo ago ?(07/28/21) 5 mo ago ?(07/14/21)  ?Sodium 135 - 145 mmol/L 133 Low   133 Low   135  135  137  136  132 Low    ?Potassium 3.5 - 5.1 mmol/L 3.7  3.5  3.6  3.8  3.7  3.8  3.4 Low    ?Chloride 98 - 111 mmol/L 103  105  105  104  104  105  102   ?CO2 22 - 32 mmol/L 25  24  25  23  24  26  22    ?Glucose, Bld 70 - 99 mg/dL 118 High   125 High  CM  108 High  CM  110 High  CM  133 High  CM  103 High  CM  141 High  CM   ?Comment: Glucose reference range applies only to samples taken after fasting for at least 8 hours.  ?BUN 6 - 20 mg/dL 13  11  10  12  9  16  12    ?Creatinine, Ser 0.61 - 1.24 mg/dL 1.13  1.16  1.24  1.14  1.18  1.19  1.14   ?Calcium 8.9 - 10.3 mg/dL 8.1 Low   7.9 Low   8.3 Low   8.3 Low   8.4 Low   8.3 Low   8.5 Low    ?Total Protein 6.5 - 8.1 g/dL 7.1  7.1  7.5  6.8  6.8  6.6  7.8   ?Albumin 3.5 - 5.0 g/dL 3.3 Low   3.3 Low   3.4 Low   3.2 Low   3.4 Low   3.3 Low   3.7   ?AST 15 - 41 U/L 34  33  29  26  29  20  22    ?ALT 0 - 44 U/L 34  32  34  22  22  24   35   ?Alkaline Phosphatase 38 - 126 U/L 143 High   138 High   141 High   116  91  82  87   ?Total Bilirubin 0.3 - 1.2 mg/dL  0.2 Low   0.3  0.4  0.4  0.4  0.5  0.8   ?GFR, Estimated >60 mL/min >60  >60 CM  >60 CM  >60 CM  >60 CM  >60 CM  >60 CM   ?Comment: (NOTE)  ?Calculated using the CKD-EPI Creatinine Equation (2021)   ?Anion gap 5 - 15 5  4  Low  CM  5 CM  8 CM  9 CM  5 CM  8 CM   ?Comment: Performed  at [facility], [address], [place], [place] [address]  ?Resulting Agency  [place] [facility] [place] [facility] [place] [facility] [place] [facility] [place] [facility] [place] [facility] [place] [facility]  ?  ? ?  ?  ?Specimen Collected: 12/29/21 12:46 Last Resulted: 12/29/21 13:19  ?  ?  ?  ? ?

## 2022-01-03 ENCOUNTER — Encounter: Payer: Self-pay | Admitting: Nurse Practitioner

## 2022-01-03 ENCOUNTER — Encounter: Payer: Self-pay | Admitting: Internal Medicine

## 2022-01-03 MED ORDER — OSIMERTINIB MESYLATE 80 MG PO TABS: ORAL_TABLET | ORAL | 4 refills | Status: DC

## 2022-01-17 ENCOUNTER — Encounter (HOSPITAL_COMMUNITY): Payer: Self-pay

## 2022-01-17 ENCOUNTER — Ambulatory Visit (HOSPITAL_COMMUNITY): Payer: Managed Care, Other (non HMO)

## 2022-01-31 ENCOUNTER — Encounter (HOSPITAL_COMMUNITY)
Admission: RE | Admit: 2022-01-31 | Discharge: 2022-01-31 | Disposition: A | Discharge status: Home / Self Care | Payer: Managed Care, Other (non HMO) | Source: Ambulatory Visit | Attending: Internal Medicine | Admitting: Internal Medicine

## 2022-01-31 DIAGNOSIS — C3432 Malignant neoplasm of lower lobe, left bronchus or lung: Secondary | ICD-10-CM | POA: Diagnosis present

## 2022-01-31 LAB — GLUCOSE, CAPILLARY: Glucose-Capillary: 94 mg/dL (ref 70–99)

## 2022-01-31 IMAGING — CT NM PET TUM IMG RESTAG (PS) SKULL BASE T - THIGH
7 of 8 series · 18 of 25 positions shown · non-contrast
Comparison: PET-CT [DATE]. Multiple intervening CT scans. The
most recent is [DATE].

CLINICAL DATA: Subsequent treatment strategy for non-small cell
lung cancer.

EXAM:
NUCLEAR MEDICINE PET SKULL BASE TO THIGH
TECHNIQUE: 11.38 mCi F-18 FDG was injected intravenously. Full-ring PET imaging
was performed from the skull base to thigh after the radiotracer. CT
data was obtained and used for attenuation correction and anatomic
localization.
Fasting blood glucose: 94 mg/dl

[Series 3: pet sk_thigh ac · axial · 5.0mm · 4.07mm/px · z∈[-982,-18]mm · 4 of 242 slices shown]
[im 1/242]
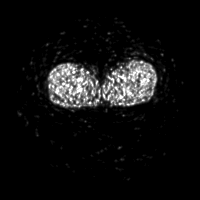
[im 121/242]
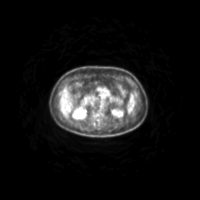
[im 181/242]
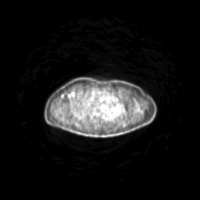
[im 242/242]
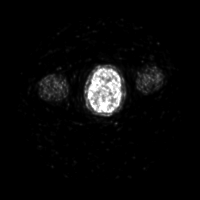

[Series 4: ct sk_thigh 5.0 br38 · axial · 5.0mm · 0.98mm/px · z∈[-742,-18]mm · 3 of 242 slices shown]
[im 61/242]
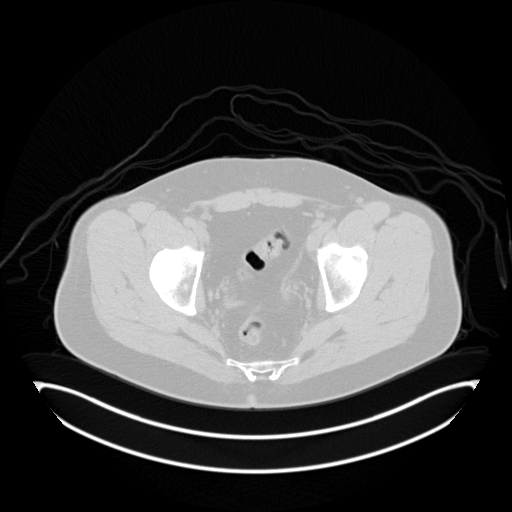
[im 121/242]
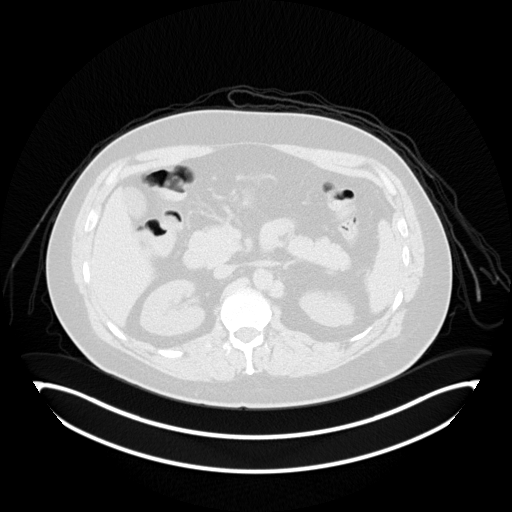
[im 242/242  brain]
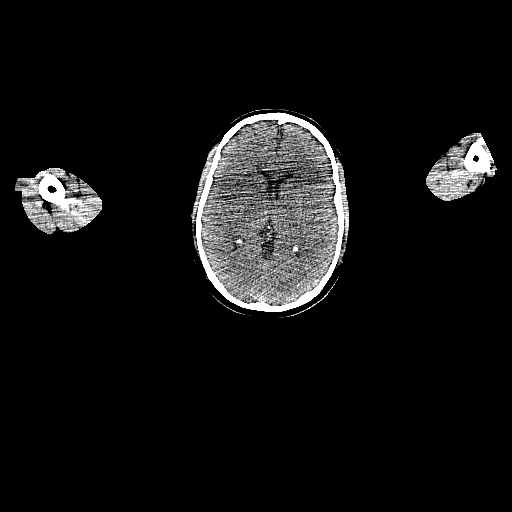

[Series 5: pet sk_thigh nac · axial · 5.0mm · 4.07mm/px · z∈[-982,-18]mm · 4 of 242 slices shown]
[im 1/242]
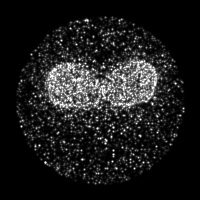
[im 61/242]
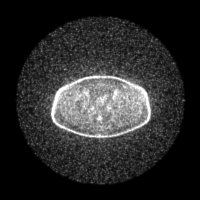
[im 181/242]
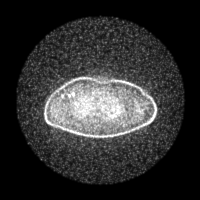
[im 242/242]
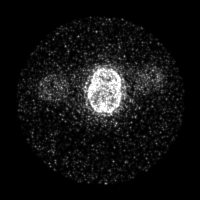

[Series 7: ct 5.0 bl57 lung_bone · axial · 5.0mm · 0.76mm/px · 1 of 70 slices shown]
[im 1/70]
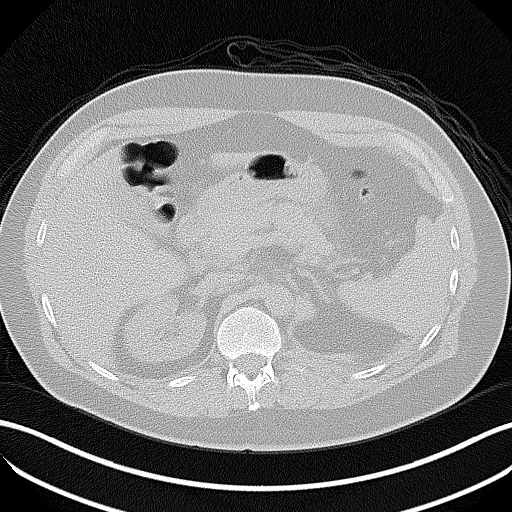

[Series 603: fused tra · 4 of 240 slices shown]
[im 1/240]
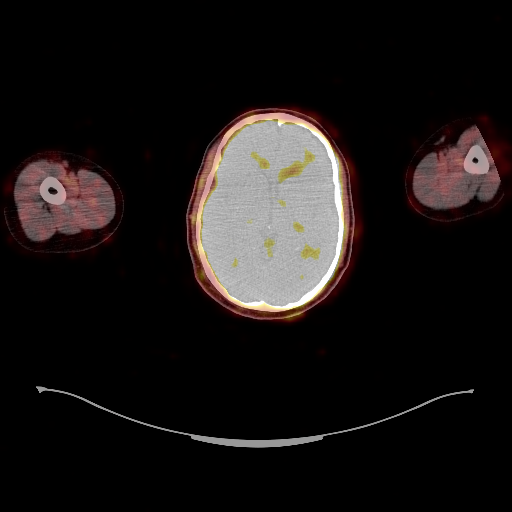
[im 60/240]
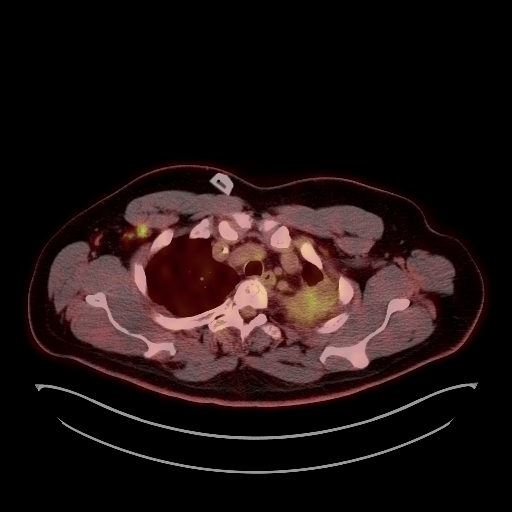
[im 180/240]
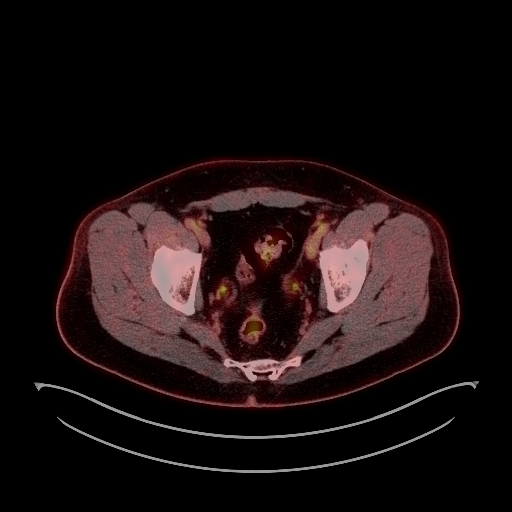
[im 240/240]
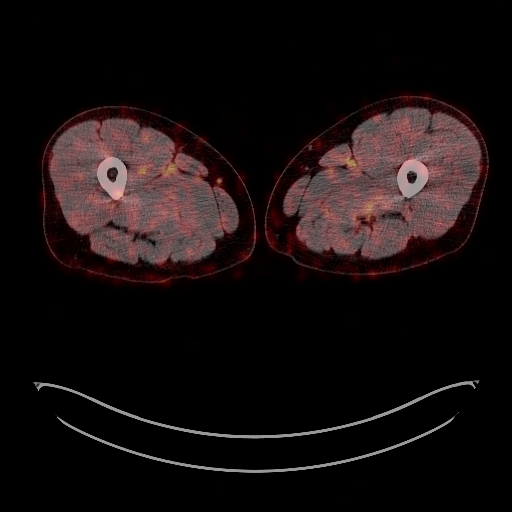

[Series 604: fused cor · 1 of 58 slices shown]
[im 1/58]
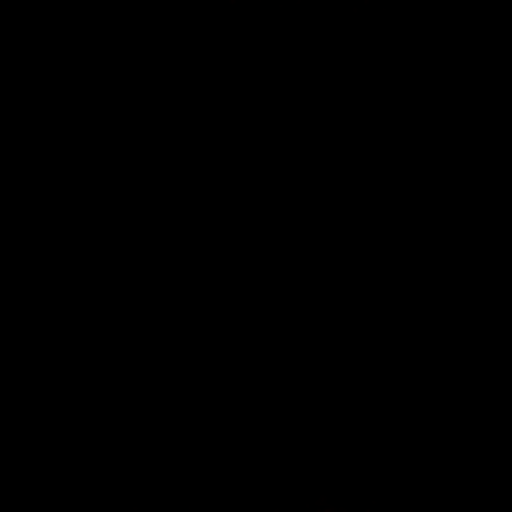

[Series 1233: results mm oncology reading · 5.0mm · 0.50mm/px · 1 of 7 slices shown]
[im 1/7]
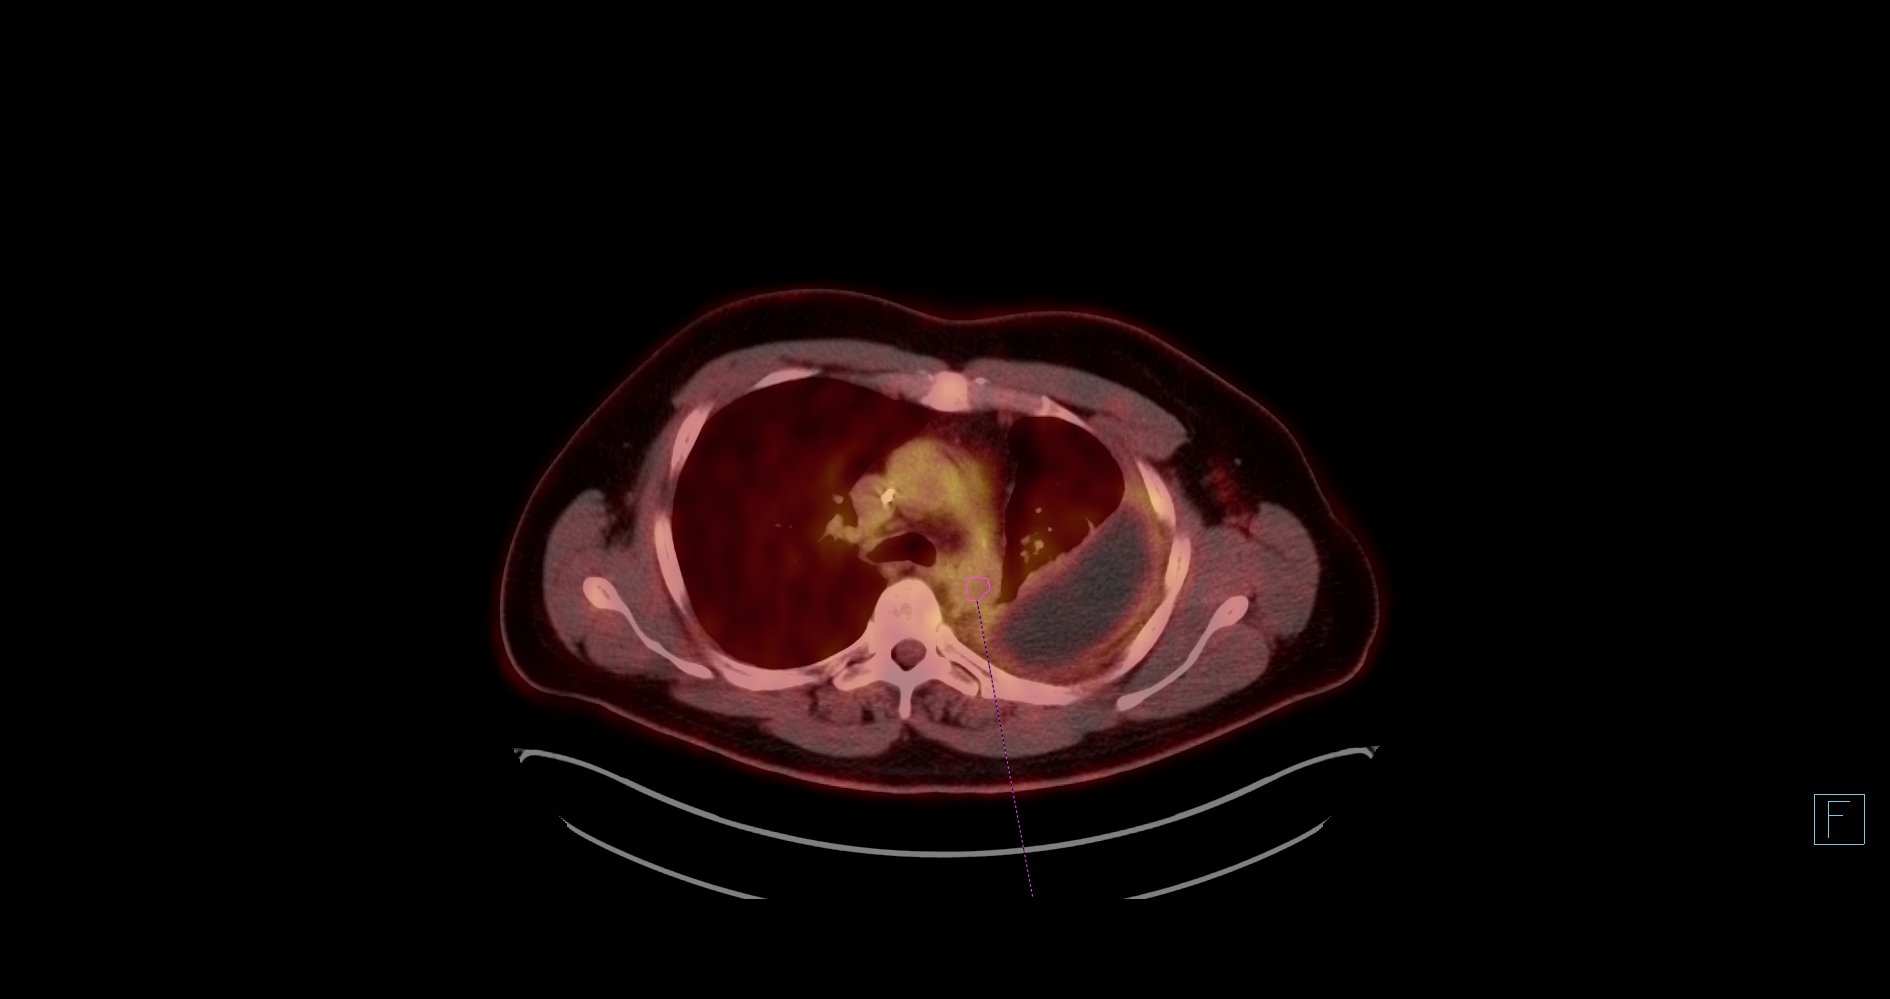

[18 of 25 positions shown; findings below may reference images not displayed]

FINDINGS: Mediastinal blood pool activity: SUV max

Liver activity: SUV max NA

NECK: No enlarged or hypermetabolic neck nodes.

Incidental CT findings: none

CHEST: The large left lower lobe mass has near completely resolved.
Minimal residual matted soft tissue density but no residual
hypermetabolism. The area of hypermetabolism in the left lung apex
is much smaller and shows less hypermetabolism. SUV max was 5.63 and
is now 4.38. There is a persistent large left pleural effusion but
it is smaller when compared to the prior PET-CT. There is irregular
nodular hypermetabolism surrounding the pleural fluid collections
suggesting a malignant effusion with pleural disease. SUV max is
3.26.

Bilateral axillary nodes demonstrate hypermetabolism which is a new
finding. 7 mm left axillary node on image 71/4 has an SUV max of
3.61. 9 mm right subpectoral node on image 62/4 has an SUV max of
0.86.

No hypermetabolic mediastinal or hilar lymph nodes.

Incidental CT findings: Compared to prior CT scans there are stable
sub solid nodules in the right lung.

ABDOMEN/PELVIS: Much improved appearance of the abdomen/pelvis. The
extensive hypermetabolic omental disease is seen on the prior PET-CT
has resolved. The hypermetabolic lesion near the splenic hilum has
resolved. No findings for a patent or adrenal gland metastasis. No
new or progressive findings in the abdomen/pelvis.

Incidental CT findings: none

SKELETON: Interval resolution of diffuse hypermetabolic metastatic
bone disease. Above foot there are still scattered lytic and
sclerotic bone lesions on the CT scan. No new or progressive
findings.

Incidental CT findings: none
IMPRESSION: 1. Overall findings suggest a good response to treatment. The large
left lung mass has near completely resolved. The omental and
peritoneal carcinomatosis has resolved. No residual hypermetabolic
bone metastasis.
2. Persistent left pleural effusion with nodular rim like areas of
hypermetabolism suggesting persistent pleural metastatic disease and
malignant pleural effusion.
3. New small hypermetabolic axillary nodes bilaterally could be
inflammatory but are suspicious for metastatic disease. Attention on
future studies is suggested.

## 2022-01-31 MED ORDER — FLUDEOXYGLUCOSE F - 18 (FDG) INJECTION
11.4000 | Freq: Once | INTRAVENOUS | Status: AC | PRN
  Administered 2022-01-31: 11.38 via INTRAVENOUS

## 2022-02-02 ENCOUNTER — Inpatient Hospital Stay: Payer: Managed Care, Other (non HMO) | Attending: Internal Medicine

## 2022-02-02 ENCOUNTER — Inpatient Hospital Stay (HOSPITAL_BASED_OUTPATIENT_CLINIC_OR_DEPARTMENT_OTHER): Payer: Managed Care, Other (non HMO) | Admitting: Internal Medicine

## 2022-02-02 ENCOUNTER — Inpatient Hospital Stay: Payer: Managed Care, Other (non HMO)

## 2022-02-02 DIAGNOSIS — Z79899 Other long term (current) drug therapy: Secondary | ICD-10-CM | POA: Diagnosis not present

## 2022-02-02 DIAGNOSIS — C7931 Secondary malignant neoplasm of brain: Secondary | ICD-10-CM | POA: Diagnosis not present

## 2022-02-02 DIAGNOSIS — C7951 Secondary malignant neoplasm of bone: Secondary | ICD-10-CM | POA: Insufficient documentation

## 2022-02-02 DIAGNOSIS — R748 Abnormal levels of other serum enzymes: Secondary | ICD-10-CM | POA: Diagnosis not present

## 2022-02-02 DIAGNOSIS — Z7901 Long term (current) use of anticoagulants: Secondary | ICD-10-CM | POA: Diagnosis not present

## 2022-02-02 DIAGNOSIS — Z86711 Personal history of pulmonary embolism: Secondary | ICD-10-CM | POA: Diagnosis not present

## 2022-02-02 DIAGNOSIS — C3492 Malignant neoplasm of unspecified part of left bronchus or lung: Secondary | ICD-10-CM

## 2022-02-02 DIAGNOSIS — C3432 Malignant neoplasm of lower lobe, left bronchus or lung: Secondary | ICD-10-CM | POA: Insufficient documentation

## 2022-02-02 DIAGNOSIS — Z95828 Presence of other vascular implants and grafts: Secondary | ICD-10-CM

## 2022-02-02 LAB — COMPREHENSIVE METABOLIC PANEL
ALT: 42 U/L (ref 0–44)
AST: 35 U/L (ref 15–41)
Albumin: 3.6 g/dL (ref 3.5–5.0)
Alkaline Phosphatase: 132 U/L — ABNORMAL HIGH (ref 38–126)
Anion gap: 6 (ref 5–15)
BUN: 16 mg/dL (ref 6–20)
CO2: 21 mmol/L — ABNORMAL LOW (ref 22–32)
Calcium: 8.2 mg/dL — ABNORMAL LOW (ref 8.9–10.3)
Chloride: 108 mmol/L (ref 98–111)
Creatinine, Ser: 1.17 mg/dL (ref 0.61–1.24)
GFR, Estimated: >60 mL/min (ref >60)
Glucose, Bld: 108 mg/dL — ABNORMAL HIGH (ref 70–99)
Potassium: 3.9 mmol/L (ref 3.5–5.1)
Sodium: 135 mmol/L (ref 135–145)
Total Bilirubin: 0.4 mg/dL (ref 0.3–1.2)
Total Protein: 7.2 g/dL (ref 6.5–8.1)

## 2022-02-02 LAB — CBC WITH DIFFERENTIAL/PLATELET
Abs Immature Granulocytes: 0.02 10*3/uL (ref 0.00–0.07)
Basophils Absolute: 0 10*3/uL (ref 0.0–0.1)
Basophils Relative: 0 %
Eosinophils Absolute: 0.1 10*3/uL (ref 0.0–0.5)
Eosinophils Relative: 2 %
HCT: 43.5 % (ref 39.0–52.0)
Hemoglobin: 14 g/dL (ref 13.0–17.0)
Immature Granulocytes: 0 %
Lymphocytes Relative: 17 %
Lymphs Abs: 1.1 10*3/uL (ref 0.7–4.0)
MCH: 26.9 pg (ref 26.0–34.0)
MCHC: 32.2 g/dL (ref 30.0–36.0)
MCV: 83.7 fL (ref 80.0–100.0)
Monocytes Absolute: 0.7 10*3/uL (ref 0.1–1.0)
Monocytes Relative: 10 %
Neutro Abs: 4.8 10*3/uL (ref 1.7–7.7)
Neutrophils Relative %: 71 %
Platelets: 237 10*3/uL (ref 150–400)
RBC: 5.2 MIL/uL (ref 4.22–5.81)
RDW: 15.3 % (ref 11.5–15.5)
WBC: 6.7 10*3/uL (ref 4.0–10.5)
nRBC: 0 % (ref 0.0–0.2)

## 2022-02-02 MED ORDER — HEPARIN SOD (PORK) LOCK FLUSH 100 UNIT/ML IV SOLN
500.0000 [IU] | Freq: Once | INTRAVENOUS | Status: AC
  Administered 2022-02-02: 500 [IU] via INTRAVENOUS
  Filled 2022-02-02: qty 5

## 2022-02-02 NOTE — Progress Notes (Signed)
Scan results. ? ?

## 2022-02-02 NOTE — Progress Notes (Signed)
[place] ?CONSULT NOTE ? ?Patient Care Team: ?Pcp, No as PCP - General ?[name], RN as Oncology Nurse Navigator ?[name], MD as Consulting Physician (Internal Medicine) ? ?CHIEF COMPLAINTS/PURPOSE OF CONSULTATION: Lung cancer ? ?#  ?Oncology History Overview Note  ?IMPRESSION: ?1. Patchy nodular fat stranding throughout the anterior left upper ?quadrant peritoneal fat, nonspecific, cannot exclude peritoneal ?carcinomatosis. Dedicated CT abdomen/pelvis with oral and IV ?contrast recommended for further evaluation. ?2. Dense patchy consolidation replacing much of the left lower lung ?lobe, appearing masslike in the superior segment left lower lobe, ?with associated bulging of the left major fissure and associated ?left lower lobe volume loss. Fine nodularity throughout both lungs ?with an upper lobe predominance. Asymmetric left upper lobe ?interlobular septal thickening. These findings are indeterminate, ?with differential including multilobar pneumonia, sarcoidosis or a ?neoplastic process. The persistence on radiographs back to ?01/20/2021 despite antibiotic therapy make sarcoidosis or a ?neoplastic process more likely. Pulmonology consultation suggested ?for consideration of bronchoscopic evaluation. ?3. Small dependent left pleural effusion. ?4. Mild mediastinal lymphadenopathy, nonspecific. ?5. Subacute healing lateral right sixth rib fracture. ?  ?DIAGNOSIS:  ?A. LUNG, LEFT LOWER LOBE; ENB-ASSISTED BIOPSY:  ?- NON-SMALL CELL CARCINOMA, FAVOR ADENOCARCINOMA.  ?- FOREIGN MATERIAL SUGGESTIVE OF ASPIRATION.  ? ?# EGFR MUTATED: 61 del- June 28th, 2022- [name].  ?  ?Cancer of lower lobe of left lung ([place])  ?03/23/2021 Initial Diagnosis  ? Cancer of lower lobe of left lung [facility]) ?  ?03/29/2021 Cancer Staging  ? Staging form: Lung, AJCC 8th Edition ?- Clinical: Stage IVB (cT3, cN3, pM1c) - Signed by [name], MD on 03/29/2021 ? ?  ?03/30/2021 -  Chemotherapy  ?   Patient is on Treatment Plan: LUNG NSCLC PEMETREXED (ALIMTA) / CARBOPLATIN Q21D X 1 CYCLES ? ?  ? ?  ? ? ? ?HISTORY OF PRESENTING ILLNESS: Ambulating independently.  With his wife ? ?Staci Righter 41 y.o.  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 ? ?Patient continues to have intermittent groin pain especially with ambulation.  Otherwise no dyspnea no nausea no vomiting.  No worsening skin rash. ? ? ?Review of Systems  ?Constitutional:  Positive for malaise/fatigue. Negative for chills, diaphoresis and fever.  ?HENT:  Negative for nosebleeds and sore throat.   ?Eyes:  Negative for double vision.  ?Respiratory:  Negative for wheezing.   ?Cardiovascular:  Negative for chest pain, palpitations, orthopnea and leg swelling.  ?Gastrointestinal:  Negative for abdominal pain, blood in stool, diarrhea, heartburn, melena, nausea and vomiting.  ?Genitourinary:  Negative for dysuria, frequency and urgency.  ?Musculoskeletal:  Negative for back pain and joint pain.  ?Skin:  Negative for itching.  ?Neurological:  Positive for weakness. Negative for dizziness, tingling, focal weakness and headaches.  ?Endo/Heme/Allergies:  Does not bruise/bleed easily.  ?Psychiatric/Behavioral:  Negative for depression. The patient is not nervous/anxious and does not have insomnia.    ? ?MEDICAL HISTORY:  ?Past Medical History:  ?Diagnosis Date  ? Cancer [facility])   ? Dyspnea   ? Heartburn   ? Pneumonia   ? Wolff-Parkinson-White (WPW) syndrome   ? 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 ? ?SURGICAL HISTORY: ?Past Surgical History:  ?Procedure Laterality Date  ? FRACTURE SURGERY    ? broke femur when he was 6 years  ? PORTA CATH INSERTION N/A 03/28/2021  ? Procedure: PORTA CATH INSERTION;  Surgeon: Katha Cabal, MD;  Location: [facility];  Service: Cardiovascular;  Laterality: N/A;  ? VIDEO BRONCHOSCOPY  WITH ENDOBRONCHIAL NAVIGATION N/A 03/15/2021  ? Procedure: VIDEO BRONCHOSCOPY WITH ENDOBRONCHIAL NAVIGATION;  Surgeon: Ottie Glazier, MD;  Location: [facility];  Service: Thoracic;  Laterality: N/A;  ? VIDEO BRONCHOSCOPY WITH ENDOBRONCHIAL ULTRASOUND N/A 03/15/2021  ? Procedure: VIDEO BRONCHOSCOPY WITH ENDOBRONCHIAL ULTRASOUND;  Surgeon: Ottie Glazier, MD;  Location: [facility];  Service: Thoracic;  Laterality: N/A;  ? ? ?SOCIAL HISTORY: ?Social History  ? ?Socioeconomic History  ? Marital status: Married  ?  Spouse name: [name]  ? Number of children: 2  ? Years of education: Not on file  ? Highest education level: Not on file  ?Occupational History  ? Not on file  ?Tobacco Use  ? Smoking status: Never  ? Smokeless tobacco: Current  ?  Types: Snuff, Chew  ?Vaping Use  ? Vaping Use: Never used  ?Substance and Sexual Activity  ? Alcohol use: Never  ? Drug use: Never  ? Sexual activity: Not on file  ?Other Topics Concern  ? Not on file  ?Social History Narrative  ? [place]; with wife; 2 daughters[12 and 22]; never smoked; rare alcohol. Work in saw [employer]   ? ?Social Determinants of Health  ? ?Financial Resource Strain: Not on file  ?Food Insecurity: Not on file  ?Transportation Needs: Not on file  ?Physical Activity: Not on file  ?Stress: Not on file  ?Social Connections: Not on file  ?Intimate Partner Violence: Not on file  ? ? ?FAMILY HISTORY: ?Family History  ?Problem Relation Age of Onset  ? High Cholesterol Mother   ? Hypertension Father   ? Cancer Father   ? Lung cancer Father   ? Skin cancer Father   ? ? ?ALLERGIES:  is allergic to codeine. ? ?MEDICATIONS:  ?Current Outpatient Medications  ?Medication Sig Dispense Refill  ? apixaban (ELIQUIS) 5 MG TABS tablet Take 1 tablet (5 mg total) by mouth 2 (two) times daily. 60 tablet 2  ? folic acid (FOLVITE) 1 MG tablet Take 1 tablet (1 mg total) by mouth daily. 90 tablet 1  ? lidocaine-prilocaine (EMLA) cream Apply 1 application topically as needed. 30 g 0  ?  metoprolol succinate (TOPROL XL) 25 MG 24 hr tablet Take 1 tablet (25 mg total) by mouth daily. (Patient taking differently: Take 50 mg by mouth daily.) 30 tablet 1  ? osimertinib mesylate (TAGRISSO) 80 MG tablet TAKE 1 TABLET (80 MG TOTAL) DAILY 30 tablet 4  ? sertraline (ZOLOFT) 50 MG tablet Take 0.5 tablets (25 mg total) by mouth daily. 30 tablet 2  ? ondansetron (ZOFRAN) 8 MG tablet One pill every 8 hours as needed for nausea/vomitting. (Patient not taking: Reported on 10/20/2021) 40 tablet 1  ? prochlorperazine (COMPAZINE) 10 MG tablet Take 1 tablet (10 mg total) by mouth every 6 (six) hours as needed for nausea or vomiting. (Patient not taking: Reported on 05/15/2021) 40 tablet 1  ? ?No current facility-administered medications for this visit.  ? ?Facility-Administered Medications Ordered in Other Visits  ?Medication Dose Route Frequency Provider Last Rate Last Admin  ? heparin lock flush 100 UNIT/ML injection           ? heparin lock flush 100 UNIT/ML injection           ? sodium chloride flush (NS) 0.9 % injection 10 mL  10 mL Intravenous PRN [name], MD   10 mL  at 07/28/21 0852  ? ? ?  ?. ? ?PHYSICAL EXAMINATION: ?ECOG PERFORMANCE STATUS: 1 - Symptomatic but completely ambulatory ? ?Vitals:  ? 02/02/22 0956  ?BP: 119/81  ?Pulse: 71  ?Temp: 97.6 ?F (36.4 ?C)  ?SpO2: 98%  ? ?Filed Weights  ? 02/02/22 0956  ?Weight: 232 lb 14.4 oz (105.6 kg)  ? ? ? ?Physical Exam ?HENT:  ?   Head: Normocephalic and atraumatic.  ?   Mouth/Throat:  ?   Pharynx: No oropharyngeal exudate.  ?Eyes:  ?   Pupils: Pupils are equal, round, and reactive to light.  ?Cardiovascular:  ?   Rate and Rhythm: Normal rate and regular rhythm.  ?Pulmonary:  ?   Effort: No respiratory distress.  ?   Breath sounds: No wheezing.  ?   Comments: Decreased breath sound on the left side compared to right. ?Abdominal:  ?   General: Bowel sounds are normal. There is no distension.  ?   Palpations: Abdomen is soft. There is no mass.  ?    Tenderness: There is no abdominal tenderness. There is no guarding or rebound.  ?Musculoskeletal:     ?   General: No tenderness. Normal range of motion.  ?   Cervical back: Normal range of motion and neck sup

## 2022-02-02 NOTE — Assessment & Plan Note (Addendum)
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 Currently on osimertinib 80 mg a day [osimertinib [since June 28th, 2022]. April 20th, 2023- PET scan- Overall findings suggest a good response to treatment. The large ?left lung mass has near completely resolved. The omental and peritoneal carcinomatosis has resolved. No residual hypermetabolic ?bone metastasis. 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  New small hypermetabolic axillary nodes bilaterally could be inflammatory but are suspicious for metastatic disease. ? ?#Discussed with the patient and wife regarding overall improved results of the PET scan-except for bilateral axilla lymph nodes which will be monitored.  Would recommend imaging CT scan in 3 months or so.  Continue osimertinib. ?? ?#  Elevated Alkaline phosphatase  April 2023-given the absence of any progressive bone disease-suspect elevated alkaline phosphatase likely from osimertinib rather than bone disease. ? ?#Left-sided pleural effusion-question reactive [July 2022]-vs malignancy; follows up with Dr. Raul Del; monitor for now. ? ?#Skin rash on the torso-likely secondary to osimertinib.  Improved with Kenalog ointment- STABLE. ? ?# Multiple brain mets subcentimeter asymptomatic [NO RT]-January 2023 brain MRI shows stable/improved brain lesions.  Continue surveillance imaging without any radiation. STABLE  We will repeat imaging in 1 month/next visit.  ? ?#Left lung PE [incidental on CT SEP 13th,2022-]?Symptomatic DVT of the right calf-on Eliquis-STABLE. ? ?#Bone metastases- CT DEc 7th, 2022 to healing process.  Dental evaluation on 9/21.  Continue calcium vitamin D intake. calcium 8.1; ?  Osimertinib. HOLD zometa.  ? 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 STABLE. ? ?#  DISPOSITION: ?# HOLD zometa;  ?# follow up in 6 weeks -MD; labs- cbc/cmp;port flush - Dr.B   ? ?# I reviewed the blood work- with the patient in detail; also reviewed the imaging independently [as summarized above]; and with the patient in detail.  ? ? ? ? ? ?

## 2022-03-16 ENCOUNTER — Inpatient Hospital Stay (HOSPITAL_BASED_OUTPATIENT_CLINIC_OR_DEPARTMENT_OTHER): Payer: Managed Care, Other (non HMO) | Admitting: Internal Medicine

## 2022-03-16 ENCOUNTER — Other Ambulatory Visit: Payer: Self-pay

## 2022-03-16 ENCOUNTER — Inpatient Hospital Stay: Payer: Managed Care, Other (non HMO) | Attending: Internal Medicine

## 2022-03-16 VITALS — BP 114/79 | HR 69 | Temp 98.1°F | Resp 16 | Wt 233.6 lb

## 2022-03-16 DIAGNOSIS — C3432 Malignant neoplasm of lower lobe, left bronchus or lung: Secondary | ICD-10-CM

## 2022-03-16 DIAGNOSIS — C7931 Secondary malignant neoplasm of brain: Secondary | ICD-10-CM | POA: Insufficient documentation

## 2022-03-16 DIAGNOSIS — Z79899 Other long term (current) drug therapy: Secondary | ICD-10-CM | POA: Insufficient documentation

## 2022-03-16 DIAGNOSIS — F17290 Nicotine dependence, other tobacco product, uncomplicated: Secondary | ICD-10-CM | POA: Diagnosis not present

## 2022-03-16 DIAGNOSIS — C7951 Secondary malignant neoplasm of bone: Secondary | ICD-10-CM | POA: Diagnosis not present

## 2022-03-16 DIAGNOSIS — Z95828 Presence of other vascular implants and grafts: Secondary | ICD-10-CM

## 2022-03-16 DIAGNOSIS — C3492 Malignant neoplasm of unspecified part of left bronchus or lung: Secondary | ICD-10-CM

## 2022-03-16 LAB — CBC WITH DIFFERENTIAL/PLATELET
Abs Immature Granulocytes: 0.04 10*3/uL (ref 0.00–0.07)
Basophils Absolute: 0 10*3/uL (ref 0.0–0.1)
Basophils Relative: 1 %
Eosinophils Absolute: 0.2 10*3/uL (ref 0.0–0.5)
Eosinophils Relative: 2 %
HCT: 42.3 % (ref 39.0–52.0)
Hemoglobin: 13.9 g/dL (ref 13.0–17.0)
Immature Granulocytes: 1 %
Lymphocytes Relative: 17 %
Lymphs Abs: 1.1 10*3/uL (ref 0.7–4.0)
MCH: 28.1 pg (ref 26.0–34.0)
MCHC: 32.9 g/dL (ref 30.0–36.0)
MCV: 85.5 fL (ref 80.0–100.0)
Monocytes Absolute: 0.7 10*3/uL (ref 0.1–1.0)
Monocytes Relative: 10 %
Neutro Abs: 4.7 10*3/uL (ref 1.7–7.7)
Neutrophils Relative %: 69 %
Platelets: 230 10*3/uL (ref 150–400)
RBC: 4.95 MIL/uL (ref 4.22–5.81)
RDW: 15.6 % — ABNORMAL HIGH (ref 11.5–15.5)
WBC: 6.8 10*3/uL (ref 4.0–10.5)
nRBC: 0 % (ref 0.0–0.2)

## 2022-03-16 LAB — COMPREHENSIVE METABOLIC PANEL
ALT: 47 U/L — ABNORMAL HIGH (ref 0–44)
AST: 46 U/L — ABNORMAL HIGH (ref 15–41)
Albumin: 3.7 g/dL (ref 3.5–5.0)
Alkaline Phosphatase: 123 U/L (ref 38–126)
Anion gap: 8 (ref 5–15)
BUN: 11 mg/dL (ref 6–20)
CO2: 24 mmol/L (ref 22–32)
Calcium: 8.1 mg/dL — ABNORMAL LOW (ref 8.9–10.3)
Chloride: 106 mmol/L (ref 98–111)
Creatinine, Ser: 1.26 mg/dL — ABNORMAL HIGH (ref 0.61–1.24)
GFR, Estimated: >60 mL/min (ref >60)
Glucose, Bld: 133 mg/dL — ABNORMAL HIGH (ref 70–99)
Potassium: 3.8 mmol/L (ref 3.5–5.1)
Sodium: 138 mmol/L (ref 135–145)
Total Bilirubin: 0.3 mg/dL (ref 0.3–1.2)
Total Protein: 7.5 g/dL (ref 6.5–8.1)

## 2022-03-16 LAB — CK: Total CK: 420 U/L — ABNORMAL HIGH (ref 49–397)

## 2022-03-16 LAB — MAGNESIUM: Magnesium: 2.2 mg/dL (ref 1.7–2.4)

## 2022-03-16 MED ORDER — SODIUM CHLORIDE 0.9% FLUSH
10.0000 mL | Freq: Once | INTRAVENOUS | Status: AC
  Administered 2022-03-16: 10 mL via INTRAVENOUS
  Filled 2022-03-16: qty 10

## 2022-03-16 MED ORDER — HEPARIN SOD (PORK) LOCK FLUSH 100 UNIT/ML IV SOLN
500.0000 [IU] | Freq: Once | INTRAVENOUS | Status: AC
  Administered 2022-03-16: 500 [IU] via INTRAVENOUS
  Filled 2022-03-16: qty 5

## 2022-03-16 NOTE — Progress Notes (Signed)
Patient having muscle cramps in legs that are worse at night.  This has happened twice this week and still having leg soreness.

## 2022-03-16 NOTE — Assessment & Plan Note (Addendum)
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 Currently on osimertinib 80 mg a day [osimertinib [since June 28th, 2022]. April 20th, 2023- PET scan- Overall findings suggest a good response to treatment. The large left lung mass has near completely resolved. The omental and peritoneal carcinomatosis has resolved. No residual hypermetabolic bone metastasis. 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    #PET scan April 2023- new small hypermetabolic axillary nodes bilaterally could be inflammatory but are suspicious for metastatic disease.r bilateral axilla lymph nodes which will be monitored.  Would recommend imaging CT scan in 3 months or so.  Order imaging at next visit consider biopsy if continues to grow.  For now continue osimertinib.  #  Elevated Alkaline phosphatase  April 2023-given the absence of any progressive bone disease-suspect elevated alkaline phosphatase likely from osimertinib rather than bone disease.  Monitor for now.  #Left-sided pleural effusion-question reactive [July 2022]-vs malignancy; follows up with Dr. Raul Del; monitor for now.  Stable  #Skin rash on the torso-likely secondary to osimertinib.  Improved with Kenalog ointment- STABLE.  # Multiple brain mets subcentimeter asymptomatic [NO RT]-January 2023 brain MRI shows stable/improved brain lesions.  Continue surveillance imaging without any radiation. STABLE  We will repeat imaging in 1 month/prior to next visit.  #Left lung PE [incidental on CT SEP 13th,2022-]?Symptomatic DVT of the right calf-on Eliquis-STABLE.  #Bone metastases- CT DEc 7th, 2022 to healing process.  Dental evaluation on 9/21.  Continue calcium vitamin D intake. calcium 8.1; ?  Osimertinib. HOLD zometa.   # Cardiac arrhythmia-SVT s/p adenosine post thoracentesis [June 2022]; Hx of WPW-stable on  metoprolol-. STABLE.  #Continued hypocalcemia- ?  Symptomatic cramps.  Vitamin D levels normal.  Discussed with pharmacy.  We will contact patient regarding supplementation.  Check CK levels.  # IV access/poor access-: port flush.   # DISPOSITION: # follow up in 5  weeks -MD; port Reva Bores- cbc/cmp;; MRI brain prior - Dr.B    Addendum: CK level slightly elevated 420.  No renal dysfunction.  The etiology is unclear.  We will repeat CK levels/aldolase at next visit in 1 month.

## 2022-03-16 NOTE — Progress Notes (Signed)
[place] NOTE  Patient Care Team: Pcp, No as PCP - General [name], RN as Oncology Nurse Navigator [name], MD as Consulting Physician (Internal Medicine)  CHIEF COMPLAINTS/PURPOSE OF CONSULTATION: Lung cancer  #  Oncology History Overview Note  IMPRESSION: 1. Patchy nodular fat stranding throughout the anterior left upper quadrant peritoneal fat, nonspecific, cannot exclude peritoneal carcinomatosis. Dedicated CT abdomen/pelvis with oral and IV contrast recommended for further evaluation. 2. Dense patchy consolidation replacing much of the left lower lung lobe, appearing masslike in the superior segment left lower lobe, with associated bulging of the left major fissure and associated left lower lobe volume loss. Fine nodularity throughout both lungs with an upper lobe predominance. Asymmetric left upper lobe interlobular septal thickening. These findings are indeterminate, with differential including multilobar pneumonia, sarcoidosis or a neoplastic process. The persistence on radiographs back to 01/20/2021 despite antibiotic therapy make sarcoidosis or a neoplastic process more likely. Pulmonology consultation suggested for consideration of bronchoscopic evaluation. 3. Small dependent left pleural effusion. 4. Mild mediastinal lymphadenopathy, nonspecific. 5. Subacute healing lateral right sixth rib fracture.   DIAGNOSIS:  A. LUNG, LEFT LOWER LOBE; ENB-ASSISTED BIOPSY:  - NON-SMALL CELL CARCINOMA, FAVOR ADENOCARCINOMA.  - FOREIGN MATERIAL SUGGESTIVE OF ASPIRATION.   # EGFR MUTATED: 18 del- June 28th, 2022- [name]. [limited]   Cancer of lower lobe of left lung ([place])  03/23/2021 Initial Diagnosis   Cancer of lower lobe of left lung ([place])   03/29/2021 Cancer Staging   Staging form: Lung, AJCC 8th Edition - Clinical: Stage IVB (cT3, cN3, pM1c) - Signed by [name], MD on 03/29/2021    03/30/2021 -  Chemotherapy     Patient is on Treatment Plan: LUNG NSCLC PEMETREXED (ALIMTA) / CARBOPLATIN Q21D X 1 CYCLES          HISTORY OF PRESENTING ILLNESS: Ambulating independently.  Alone.  [name] 41 y.o.  male patient with stage IV lung cancer adenocarcinoma-brain mets [no WBRT]/bone mets EGFR mutated on osimertinib; asymptomatic metastatic disease to brain [no WBRT] is here for follow-up.  Denies any worsening pain. Patient continues to have intermittent groin pain especially with ambulation.  Otherwise no dyspnea no nausea no vomiting.  No worsening skin rash.  Complains of intermittent cramps in his legs.  Especially at nighttime.   Review of Systems  Constitutional:  Positive for malaise/fatigue. Negative for chills, diaphoresis and fever.  HENT:  Negative for nosebleeds and sore throat.   Eyes:  Negative for double vision.  Respiratory:  Negative for wheezing.   Cardiovascular:  Negative for chest pain, palpitations, orthopnea and leg swelling.  Gastrointestinal:  Negative for abdominal pain, blood in stool, diarrhea, heartburn, melena, nausea and vomiting.  Genitourinary:  Negative for dysuria, frequency and urgency.  Musculoskeletal:  Negative for back pain and joint pain.  Skin:  Negative for itching.  Neurological:  Positive for weakness. Negative for dizziness, tingling, focal weakness and headaches.  Endo/Heme/Allergies:  Does not bruise/bleed easily.  Psychiatric/Behavioral:  Negative for depression. The patient is not nervous/anxious and does not have insomnia.     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    SURGICAL HISTORY: Past Surgical History:  Procedure Laterality Date   FRACTURE SURGERY     broke femur when he was 6 years   PORTA CATH  INSERTION N/A 03/28/2021   Procedure: PORTA CATH INSERTION;  Surgeon: Katha Cabal, MD;  Location:  [facility];  Service: Cardiovascular;  Laterality: N/A;   VIDEO BRONCHOSCOPY WITH ENDOBRONCHIAL NAVIGATION N/A 03/15/2021   Procedure: VIDEO BRONCHOSCOPY WITH ENDOBRONCHIAL NAVIGATION;  Surgeon: Ottie Glazier, MD;  Location: [facility];  Service: Thoracic;  Laterality: N/A;   VIDEO BRONCHOSCOPY WITH ENDOBRONCHIAL ULTRASOUND N/A 03/15/2021   Procedure: VIDEO BRONCHOSCOPY WITH ENDOBRONCHIAL ULTRASOUND;  Surgeon: Ottie Glazier, MD;  Location: [facility];  Service: Thoracic;  Laterality: N/A;    SOCIAL HISTORY: Social History   Socioeconomic History   Marital status: Married    Spouse name: [name]   Number of children: 2   Years of education: Not on file   Highest education level: Not on file  Occupational History   Not on file  Tobacco Use   Smoking status: Never   Smokeless tobacco: Current    Types: Snuff, Chew  Vaping Use   Vaping Use: Never used  Substance and Sexual Activity   Alcohol use: Never   Drug use: Never   Sexual activity: Not on file  Other Topics Concern   Not on file  Social History Narrative   [place]; with wife; 2 daughters[12 and 22]; never smoked; rare alcohol. Work in [employer]    Social Determinants of Health   Financial Resource Strain: Not on file  Food Insecurity: Not on file  Transportation Needs: No Transportation Needs   Lack of Transportation (Medical): No   Lack of Transportation (Non-Medical): No  Physical Activity: Not on file  Stress: Not on file  Social Connections: Not on file  Intimate Partner Violence: Not on file    FAMILY HISTORY: Family History  Problem Relation Age of Onset   High Cholesterol Mother    Hypertension Father    Cancer Father    Lung cancer Father    Skin cancer Father     ALLERGIES:  is allergic to codeine.  MEDICATIONS:  Current Outpatient Medications  Medication Sig Dispense Refill   apixaban (ELIQUIS) 5 MG TABS tablet Take 1 tablet (5 mg total) by mouth 2 (two) times daily. 60 tablet  2   folic acid (FOLVITE) 1 MG tablet Take 1 tablet (1 mg total) by mouth daily. 90 tablet 1   lidocaine-prilocaine (EMLA) cream Apply 1 application topically as needed. 30 g 0   metoprolol succinate (TOPROL XL) 25 MG 24 hr tablet Take 1 tablet (25 mg total) by mouth daily. (Patient taking differently: Take 50 mg by mouth daily.) 30 tablet 1   osimertinib mesylate (TAGRISSO) 80 MG tablet TAKE 1 TABLET (80 MG TOTAL) DAILY 30 tablet 4   sertraline (ZOLOFT) 50 MG tablet Take 0.5 tablets (25 mg total) by mouth daily. 30 tablet 2   ondansetron (ZOFRAN) 8 MG tablet One pill every 8 hours as needed for nausea/vomitting. (Patient not taking: Reported on 10/20/2021) 40 tablet 1   prochlorperazine (COMPAZINE) 10 MG tablet Take 1 tablet (10 mg total) by mouth every 6 (six) hours as needed for nausea or vomiting. (Patient not taking: Reported on 05/15/2021) 40 tablet 1   No current facility-administered medications for this visit.   Facility-Administered Medications Ordered in Other Visits  Medication Dose Route Frequency Provider Last Rate Last Admin   heparin lock flush 100 UNIT/ML injection            heparin lock flush 100 UNIT/ML injection  sodium chloride flush (NS) 0.9 % injection 10 mL  10 mL Intravenous PRN [name], MD   10 mL at 07/28/21 0852      .  PHYSICAL EXAMINATION: ECOG PERFORMANCE STATUS: 1 - Symptomatic but completely ambulatory  Vitals:   03/16/22 1500  BP: 114/79  Pulse: 69  Resp: 16  Temp: 98.1 F (36.7 C)    Filed Weights   03/16/22 1500  Weight: 233 lb 9.6 oz (106 kg)      Physical Exam HENT:     Head: Normocephalic and atraumatic.     Mouth/Throat:     Pharynx: No oropharyngeal exudate.  Eyes:     Pupils: Pupils are equal, round, and reactive to light.  Cardiovascular:     Rate and Rhythm: Normal rate and regular rhythm.  Pulmonary:     Effort: No respiratory distress.     Breath sounds: No wheezing.     Comments: Decreased breath  sound on the left side compared to right. Abdominal:     General: Bowel sounds are normal. There is no distension.     Palpations: Abdomen is soft. There is no mass.     Tenderness: There is no abdominal tenderness. There is no guarding or rebound.  Musculoskeletal:        General: No tenderness. Normal range of motion.     Cervical back: Normal range of motion and neck supple.  Skin:    General: Skin is warm.  Neurological:     Mental Status: He is alert and oriented to person, place, and time.  Psychiatric:        Mood and Affect: Affect normal.   Fungal dermatitis bilateral buttocks groin/underneath abdominal pannus  LABORATORY DATA:  I have reviewed the data as listed Lab Results  Component Value Date   WBC 6.8 03/16/2022   HGB 13.9 03/16/2022   HCT 42.3 03/16/2022   MCV 85.5 03/16/2022   PLT 230 03/16/2022   Recent Labs    12/29/21 1246 02/02/22 0950 03/16/22 1527  NA 133* 135 138  K 3.7 3.9 3.8  CL 103 108 106  CO2 25 21* 24  GLUCOSE 118* 108* 133*  BUN '13 16 11  ' CREATININE 1.13 1.17 1.26*  CALCIUM 8.1* 8.2* 8.1*  GFRNONAA >60 >60 >60  PROT 7.1 7.2 7.5  ALBUMIN 3.3* 3.6 3.7  AST 34 35 46*  ALT 34 42 47*  ALKPHOS 143* 132* 123  BILITOT 0.2* 0.4 0.3    RADIOGRAPHIC STUDIES: I have personally reviewed the radiological images as listed and agreed with the findings in the report. No results found.  ASSESSMENT & PLAN:   Cancer of lower lobe of left lung ([place]) # STAGE IV- Left lower lobe lung cancer-adenocarcinoma the lung;  EGFR- 19 del POSITIVE. Currently on osimertinib 80 mg a day [osimertinib [since June 28th, 2022]. April 20th, 2023- PET scan- Overall findings suggest a good response to treatment. The large left lung mass has near completely resolved. The omental and peritoneal carcinomatosis has resolved. No residual hypermetabolic bone metastasis. 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    #PET scan April 2023- new small hypermetabolic axillary nodes bilaterally could be inflammatory but are suspicious for metastatic disease.r bilateral axilla lymph nodes which will be monitored.  Would recommend imaging CT scan in 3 months or so.  Order imaging at next visit consider biopsy if continues to grow.  For  now continue osimertinib.   #  Elevated Alkaline phosphatase  April 2023-given the absence of any progressive bone disease-suspect elevated alkaline phosphatase likely from osimertinib rather than bone disease.  Monitor for now.  #Left-sided pleural effusion-question reactive [July 2022]-vs malignancy; follows up with Dr. Raul Del; monitor for now.  Stable  #Skin rash on the torso-likely secondary to osimertinib.  Improved with Kenalog ointment- STABLE.  # Multiple brain mets subcentimeter asymptomatic [NO RT]-January 2023 brain MRI shows stable/improved brain lesions.  Continue surveillance imaging without any radiation. STABLE  We will repeat imaging in 1 month/prior to next visit.  #Left lung PE [incidental on CT SEP 13th,2022-]?Symptomatic DVT of the right calf-on Eliquis-STABLE.  #Bone metastases- CT DEc 7th, 2022 to healing process.  Dental evaluation on 9/21.  Continue calcium vitamin D intake. calcium 8.1; ?  Osimertinib. HOLD zometa.   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 STABLE.  #Continued hypocalcemia- ?  Symptomatic cramps.  Vitamin D levels normal.  Discussed with pharmacy.  We will contact patient regarding supplementation.  Check CK levels.  # IV access/poor access-: port flush.   # DISPOSITION: # follow up in 5  weeks -MD; port Reva Bores- cbc/cmp;; MRI brain prior - Dr.B                All questions were answered. The patient knows to call the clinic with any problems, questions or concerns.    [name], MD 03/18/2022 9:47  PM

## 2022-03-18 ENCOUNTER — Encounter: Payer: Self-pay | Admitting: Nurse Practitioner

## 2022-03-18 ENCOUNTER — Encounter: Payer: Self-pay | Admitting: Internal Medicine

## 2022-03-18 ENCOUNTER — Other Ambulatory Visit: Payer: Self-pay | Admitting: Internal Medicine

## 2022-03-18 DIAGNOSIS — M791 Myalgia, unspecified site: Secondary | ICD-10-CM

## 2022-03-18 DIAGNOSIS — C3432 Malignant neoplasm of lower lobe, left bronchus or lung: Secondary | ICD-10-CM

## 2022-03-20 ENCOUNTER — Telehealth: Payer: Self-pay | Admitting: Pharmacist

## 2022-03-20 DIAGNOSIS — C3432 Malignant neoplasm of lower lobe, left bronchus or lung: Secondary | ICD-10-CM

## 2022-03-20 NOTE — Telephone Encounter (Signed)
Oral Chemotherapy Pharmacist Encounter   Called patient to ask about his home calcium supplement. He reports taking Calcium 1200mg  +58mcg VitD once or twice a day. It sounds like he has trouble remembering to take the calcium supplement. Discussed the importance of the supplement and taking it more regularly with Ysidro Evert.  Medication added to med list.    Darl Pikes, PharmD, BCPS, BCOP, CPP Hematology/Oncology Clinical Pharmacist [place]/DB/AP [facility] [phone]  03/20/2022 10:21 AM

## 2022-03-21 ENCOUNTER — Other Ambulatory Visit: Payer: Self-pay | Admitting: Nurse Practitioner

## 2022-04-05 ENCOUNTER — Encounter: Payer: Self-pay | Admitting: Internal Medicine

## 2022-04-05 ENCOUNTER — Other Ambulatory Visit: Payer: Self-pay

## 2022-04-05 DIAGNOSIS — C3432 Malignant neoplasm of lower lobe, left bronchus or lung: Secondary | ICD-10-CM

## 2022-04-05 MED ORDER — FOLIC ACID 1 MG PO TABS: 1.0000 mg | ORAL_TABLET | Freq: Every day | ORAL | 1 refills | Status: DC

## 2022-04-12 ENCOUNTER — Ambulatory Visit
Admission: RE | Admit: 2022-04-12 | Discharge: 2022-04-12 | Disposition: A | Discharge status: Home / Self Care | Payer: Managed Care, Other (non HMO) | Source: Ambulatory Visit | Attending: Internal Medicine | Admitting: Internal Medicine

## 2022-04-12 DIAGNOSIS — C7931 Secondary malignant neoplasm of brain: Secondary | ICD-10-CM | POA: Insufficient documentation

## 2022-04-12 DIAGNOSIS — C3432 Malignant neoplasm of lower lobe, left bronchus or lung: Secondary | ICD-10-CM | POA: Diagnosis present

## 2022-04-12 MED ORDER — HEPARIN SOD (PORK) LOCK FLUSH 100 UNIT/ML IV SOLN
INTRAVENOUS | Status: AC
  Filled 2022-04-12: qty 5

## 2022-04-12 MED ORDER — GADOBUTROL 1 MMOL/ML IV SOLN
9.0000 mL | Freq: Once | INTRAVENOUS | Status: AC | PRN
  Administered 2022-04-12: 9 mL via INTRAVENOUS

## 2022-04-12 MED ORDER — HEPARIN SOD (PORK) LOCK FLUSH 100 UNIT/ML IV SOLN
500.0000 [IU] | Freq: Once | INTRAVENOUS | Status: AC
  Administered 2022-04-12: 500 [IU] via INTRAVENOUS
  Filled 2022-04-12: qty 5

## 2022-04-18 ENCOUNTER — Encounter: Payer: Self-pay | Admitting: Medical Oncology

## 2022-04-18 ENCOUNTER — Encounter: Payer: Self-pay | Admitting: Internal Medicine

## 2022-04-18 ENCOUNTER — Inpatient Hospital Stay (HOSPITAL_BASED_OUTPATIENT_CLINIC_OR_DEPARTMENT_OTHER): Payer: Managed Care, Other (non HMO) | Admitting: Medical Oncology

## 2022-04-18 ENCOUNTER — Ambulatory Visit
Admission: RE | Admit: 2022-04-18 | Discharge: 2022-04-18 | Disposition: A | Discharge status: Home / Self Care | Payer: Managed Care, Other (non HMO) | Source: Ambulatory Visit | Attending: Medical Oncology | Admitting: Medical Oncology

## 2022-04-18 ENCOUNTER — Ambulatory Visit
Admission: RE | Admit: 2022-04-18 | Discharge: 2022-04-18 | Disposition: A | Discharge status: Home / Self Care | Payer: Managed Care, Other (non HMO) | Attending: Medical Oncology | Admitting: Medical Oncology

## 2022-04-18 ENCOUNTER — Other Ambulatory Visit: Payer: Self-pay

## 2022-04-18 ENCOUNTER — Inpatient Hospital Stay: Payer: Managed Care, Other (non HMO) | Attending: Internal Medicine

## 2022-04-18 VITALS — BP 132/87 | HR 73 | Temp 97.0°F | Resp 22 | Ht 70.0 in | Wt 237.5 lb

## 2022-04-18 DIAGNOSIS — C7931 Secondary malignant neoplasm of brain: Secondary | ICD-10-CM | POA: Diagnosis present

## 2022-04-18 DIAGNOSIS — R051 Acute cough: Secondary | ICD-10-CM

## 2022-04-18 DIAGNOSIS — I456 Pre-excitation syndrome: Secondary | ICD-10-CM | POA: Insufficient documentation

## 2022-04-18 DIAGNOSIS — C3432 Malignant neoplasm of lower lobe, left bronchus or lung: Secondary | ICD-10-CM

## 2022-04-18 DIAGNOSIS — Z7901 Long term (current) use of anticoagulants: Secondary | ICD-10-CM | POA: Diagnosis not present

## 2022-04-18 DIAGNOSIS — J90 Pleural effusion, not elsewhere classified: Secondary | ICD-10-CM | POA: Insufficient documentation

## 2022-04-18 DIAGNOSIS — Z79899 Other long term (current) drug therapy: Secondary | ICD-10-CM | POA: Insufficient documentation

## 2022-04-18 LAB — CBC WITH DIFFERENTIAL/PLATELET
Abs Immature Granulocytes: 0.05 10*3/uL (ref 0.00–0.07)
Basophils Absolute: 0 10*3/uL (ref 0.0–0.1)
Basophils Relative: 1 %
Eosinophils Absolute: 0.2 10*3/uL (ref 0.0–0.5)
Eosinophils Relative: 3 %
HCT: 43.6 % (ref 39.0–52.0)
Hemoglobin: 14.1 g/dL (ref 13.0–17.0)
Immature Granulocytes: 1 %
Lymphocytes Relative: 13 %
Lymphs Abs: 1 10*3/uL (ref 0.7–4.0)
MCH: 28.1 pg (ref 26.0–34.0)
MCHC: 32.3 g/dL (ref 30.0–36.0)
MCV: 86.9 fL (ref 80.0–100.0)
Monocytes Absolute: 0.7 10*3/uL (ref 0.1–1.0)
Monocytes Relative: 9 %
Neutro Abs: 5.7 10*3/uL (ref 1.7–7.7)
Neutrophils Relative %: 73 %
Platelets: 239 10*3/uL (ref 150–400)
RBC: 5.02 MIL/uL (ref 4.22–5.81)
RDW: 15 % (ref 11.5–15.5)
WBC: 7.7 10*3/uL (ref 4.0–10.5)
nRBC: 0 % (ref 0.0–0.2)

## 2022-04-18 LAB — BASIC METABOLIC PANEL
Anion gap: 5 (ref 5–15)
BUN: 9 mg/dL (ref 6–20)
CO2: 26 mmol/L (ref 22–32)
Calcium: 8.5 mg/dL — ABNORMAL LOW (ref 8.9–10.3)
Chloride: 106 mmol/L (ref 98–111)
Creatinine, Ser: 1.11 mg/dL (ref 0.61–1.24)
GFR, Estimated: >60 mL/min (ref >60)
Glucose, Bld: 92 mg/dL (ref 70–99)
Potassium: 4.3 mmol/L (ref 3.5–5.1)
Sodium: 137 mmol/L (ref 135–145)

## 2022-04-18 MED ORDER — ALBUTEROL SULFATE HFA 108 (90 BASE) MCG/ACT IN AERS: 2.0000 | INHALATION_SPRAY | Freq: Four times a day (QID) | RESPIRATORY_TRACT | 2 refills | Status: DC | PRN

## 2022-04-18 MED ORDER — BENZONATATE 200 MG PO CAPS: 200.0000 mg | ORAL_CAPSULE | Freq: Three times a day (TID) | ORAL | 0 refills | Status: DC | PRN

## 2022-04-18 MED ORDER — PREDNISONE 10 MG PO TABS: ORAL_TABLET | ORAL | 0 refills | Status: DC

## 2022-04-18 NOTE — Telephone Encounter (Signed)
I spoke with [name], [place]. Will order a Stat chest xray, cbc, metb- obtain xray prior to [facility] visit.

## 2022-04-18 NOTE — Progress Notes (Signed)
I spoke to patient regarding MRI brain slightly more visible lesions in the brain.  However the lesions  are still quite small less than 1 cm in size.  We will discuss further at next visit./In 2 days.  Patient never had radiation.  With regards to evaluation in symptom management chest x-ray overall stable chronic left-sided pleural effusion not any worse.  Agree with steroids/inhalers/cough suppressant.

## 2022-04-18 NOTE — Progress Notes (Signed)
Symptom Management [place] at [facility] Telephone:([phone]) [phone] Fax:([phone]) [phone]  Patient Care Team: Pcp, No as PCP - General [name], RN as Oncology Nurse Navigator [name], MD as Consulting Physician (Internal Medicine)   Name of the patient: [name]  [id number]  1981/02/12   Date of visit: 04/18/22  Reason for Consult: [name] is a 41 y.o. male who presents today for:  Cough: Pt reports that last week he had a standard cough which he attributed to a common cold. Last night he began having a wheeze like cough. Coughing often. Not SOB and no chest pain but he reports that his abdominal muscles and ribs are a bit sore from coughing. Coughing kept him up last night so he is tried today. No fever, vomiting, diarrhea. No known sick contacts. No hemoptysis. No recent hospitalizations, surgeries or procedures. On chemotherapy.    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    PAST SURGICAL HISTORY:  Past Surgical History:  Procedure Laterality Date   FRACTURE SURGERY     broke femur when he was 6 years   PORTA CATH INSERTION N/A 03/28/2021   Procedure: PORTA CATH INSERTION;  Surgeon: Katha Cabal, MD;  Location: [facility];  Service: Cardiovascular;  Laterality: N/A;   VIDEO BRONCHOSCOPY WITH ENDOBRONCHIAL NAVIGATION N/A 03/15/2021   Procedure: VIDEO BRONCHOSCOPY WITH ENDOBRONCHIAL NAVIGATION;  Surgeon: Ottie Glazier, MD;  Location: [facility];  Service: Thoracic;  Laterality: N/A;   VIDEO BRONCHOSCOPY WITH ENDOBRONCHIAL ULTRASOUND N/A 03/15/2021   Procedure: VIDEO BRONCHOSCOPY WITH ENDOBRONCHIAL ULTRASOUND;  Surgeon: Ottie Glazier, MD;  Location: [facility];  Service: Thoracic;  Laterality: N/A;    HEMATOLOGY/ONCOLOGY HISTORY:  Oncology History Overview Note   IMPRESSION: 1. Patchy nodular fat stranding throughout the anterior left upper quadrant peritoneal fat, nonspecific, cannot exclude peritoneal carcinomatosis. Dedicated CT abdomen/pelvis with oral and IV contrast recommended for further evaluation. 2. Dense patchy consolidation replacing much of the left lower lung lobe, appearing masslike in the superior segment left lower lobe, with associated bulging of the left major fissure and associated left lower lobe volume loss. Fine nodularity throughout both lungs with an upper lobe predominance. Asymmetric left upper lobe interlobular septal thickening. These findings are indeterminate, with differential including multilobar pneumonia, sarcoidosis or a neoplastic process. The persistence on radiographs back to 01/20/2021 despite antibiotic therapy make sarcoidosis or a neoplastic process more likely. Pulmonology consultation suggested for consideration of bronchoscopic evaluation. 3. Small dependent left pleural effusion. 4. Mild mediastinal lymphadenopathy, nonspecific. 5. Subacute healing lateral right sixth rib fracture.   DIAGNOSIS:  A. LUNG, LEFT LOWER LOBE; ENB-ASSISTED BIOPSY:  - NON-SMALL CELL CARCINOMA, FAVOR ADENOCARCINOMA.  - FOREIGN MATERIAL SUGGESTIVE OF ASPIRATION.   # EGFR MUTATED: 77 del- June 28th, 2022- [name]. 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  MEDICATIONS:  Current Outpatient Medications  Medication Sig Dispense Refill   albuterol (VENTOLIN HFA) 108 (90 Base) MCG/ACT inhaler Inhale 2 puffs into  the lungs every 6 (six) hours as needed for wheezing or shortness of breath. 8 g 2   apixaban (ELIQUIS) 5 MG TABS tablet Take 1 tablet (5 mg total) by mouth 2 (two) times daily. 60 tablet 2   benzonatate (TESSALON) 200 MG capsule Take 1 capsule (200 mg total) by mouth 3 (three) times daily as needed for cough. 20 capsule 0   Calcium-Magnesium-Vitamin D (CALCIUM 1200+D3 PO) Take 1,200 mg by mouth 2 (two) times daily.     folic acid (FOLVITE) 1 MG tablet Take 1 tablet (1 mg total) by mouth daily. 90 tablet 1   lidocaine-prilocaine (EMLA) cream Apply 1 application topically as needed. 30 g 0   metoprolol succinate (TOPROL XL) 25 MG 24 hr tablet Take 1 tablet (25 mg total) by mouth daily. (Patient taking differently: Take 50 mg by mouth daily.) 30 tablet 1   osimertinib mesylate (TAGRISSO) 80 MG tablet TAKE 1 TABLET (80 MG TOTAL) DAILY 30 tablet 4   predniSONE (DELTASONE) 10 MG tablet Take 4 tablets by mouth with breakfast for 2 days, 2 tablets by mouth for 2 days and 1 tablet by mouth for 2 days. 14 tablet 0   sertraline (ZOLOFT) 50 MG tablet Take 0.5 tablets (25 mg total) by mouth daily. 30 tablet 2   ondansetron (ZOFRAN) 8 MG tablet One pill every 8 hours as needed for nausea/vomitting. (Patient not taking: Reported on 04/18/2022) 40 tablet 1   prochlorperazine (COMPAZINE) 10 MG tablet Take 1 tablet (10 mg total) by mouth every 6 (six) hours as needed for nausea or vomiting. (Patient not taking: Reported on 04/18/2022) 40 tablet 1   No current facility-administered medications for this visit.   Facility-Administered Medications Ordered in Other Visits  Medication Dose Route Frequency Provider Last Rate Last Admin   heparin lock flush 100 UNIT/ML injection            heparin lock flush 100 UNIT/ML injection            sodium chloride flush (NS) 0.9 % injection 10 mL  10 mL Intravenous PRN [name], MD   10 mL at 07/28/21 0852    VITAL SIGNS: BP 132/87   Pulse 73   Temp (!) 97 F (36.1  C) (Tympanic)   Resp (!) 22   Ht '5\' 10"'  (1.778 m)   Wt 237 lb 8 oz (107.7 kg)   SpO2 100%   BMI 34.08 kg/m  Filed Weights   04/18/22 1330  Weight: 237 lb 8 oz (107.7 kg)    Estimated body mass index is 34.08 kg/m as calculated from the following:   Height as of this encounter: '5\' 10"'  (1.778 m).   Weight as of this encounter: 237 lb 8 oz (107.7 kg).  LABS: CBC:    Component Value Date/Time   WBC 7.7 04/18/2022 1312   HGB 14.1 04/18/2022 1312   HCT 43.6 04/18/2022 1312   PLT 239 04/18/2022 1312   MCV 86.9 04/18/2022 1312   NEUTROABS 5.7 04/18/2022 1312   LYMPHSABS 1.0 04/18/2022 1312   MONOABS 0.7 04/18/2022 1312   EOSABS 0.2 04/18/2022 1312   BASOSABS 0.0 04/18/2022 1312   Comprehensive Metabolic Panel:    Component Value Date/Time   NA 137 04/18/2022 1312   K 4.3 04/18/2022 1312   CL 106 04/18/2022 1312  CO2 26 04/18/2022 1312   BUN 9 04/18/2022 1312   CREATININE 1.11 04/18/2022 1312   GLUCOSE 92 04/18/2022 1312   CALCIUM 8.5 (L) 04/18/2022 1312   AST 46 (H) 03/16/2022 1527   ALT 47 (H) 03/16/2022 1527   ALKPHOS 123 03/16/2022 1527   BILITOT 0.3 03/16/2022 1527   PROT 7.5 03/16/2022 1527   ALBUMIN 3.7 03/16/2022 1527    RADIOGRAPHIC STUDIES: DG Chest 2 View  Result Date: 04/18/2022 CLINICAL DATA:  Cough.  History of metastatic lung cancer. EXAM: CHEST - 2 VIEW COMPARISON:  Chest radiograph 10/31/2021, chest CT 12/22/2021, and PET-CT 01/31/2022. FINDINGS: A right jugular Port-A-Cath remains in place, terminating over the lower SVC. The cardiomediastinal silhouette is unchanged. A moderate to large, loculated left pleural effusion is unchanged. Underlying left parenchymal lung opacities have also not significantly changed in the setting of treated left lower lobe malignancy. The right lung is clear. No pneumothorax is identified. No acute osseous abnormality is seen. IMPRESSION: Unchanged appearance of the chest, including a moderate to large chronic loculated  pleural effusion. Electronically Signed   By: Logan Bores M.D.   On: 04/18/2022 11:37   MR Brain W Wo Contrast  Result Date: 04/12/2022 CLINICAL DATA:  History of lung cancer, evaluate for metastatic disease EXAM: MRI HEAD WITHOUT AND WITH CONTRAST TECHNIQUE: Multiplanar, multiecho pulse sequences of the brain and surrounding structures were obtained without and with intravenous contrast. CONTRAST:  1m GADAVIST GADOBUTROL 1 MMOL/ML IV SOLN COMPARISON:  Brain MRI 10/18/2018 FINDINGS: Brain: There is no acute intracranial hemorrhage, extra-axial fluid collection, or acute infarct. Parenchymal volume is normal. The ventricles are normal in size. Gray-white differentiation is preserved. *Two enhancing lesions in the left frontal lobe best seen on the coronal postcontrast sequence measuring 6 mm x 5 mm and 3 mm x 3 mm (19-16, 19-15) are increased in size and conspicuity since 10/18/2018. *2-3 mm lesion in the right parietal lobe is slightly increased in size and conspicuity (18-116). *2 mm lesion in the right frontal lobe is slightly increased in size and conspicuity (18-116). *3 mm lesion in the medial right frontal lobe is not significantly changed (18-127). *4 mm lesion more inferolaterally in the right parietal lobe is stable (18-93). *Punctate lesion in the left occipital lobe is unchanged (21-18, 18-53). *2 mm lesion in the left parietal lobe in the precuneus was not definitely seen on the prior study (21-15). *No other definite new lesions are identified. Some of the lesions demonstrate SWI signal dropout consistent with intralesional blood products similar to the prior study. There is no significant vasogenic edema and no mass effect or midline shift. Vascular: Normal flow voids. Skull and upper cervical spine: Normal marrow signal. Sinuses/Orbits: The paranasal sinuses are clear. The globes and orbits are unremarkable. Other: None. 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 While some of the differences in conspicuity may be related to contrast timing/technique, the largest lesions in the left frontal lobe are noticeably increased in size. Electronically Signed   By: PValetta MoleM.D.   On: 04/12/2022 11:59    PERFORMANCE STATUS (ECOG) : 1 - Symptomatic but completely ambulatory  Review of Systems Unless otherwise noted, a complete review of systems is negative.  Physical Exam General: NAD Cardiovascular: regular rate and rhythm Pulmonary: clear ant fields without wheezes, rhonchi or rales  Abdomen: soft, nontender, + bowel sounds Extremities: no edema, no joint deformities Skin: no rashes or cyanosis  Neurological: Weakness but otherwise nonfocal  Assessment and Plan- Patient is a 41 y.o. male  Encounter Diagnoses  Name Primary?   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 Chest x ray is stable and does not show any pneumonia. He is afebrile with 100% o2 saturation, no chest pain and no hemoptysis. No sick contacts with influenza/COVID-19. Likely viral bronchitis. Treating with tessalon, prednisone, albuterol. Discussed how to use along with common potential side effects. Discussed red flag signs and symptoms. Follow up in 2 days at his regularly scheduled visit.    Patient expressed understanding and was in agreement with this plan. He also understands that He can call clinic at any time with any questions, concerns, or complaints.   Thank you for allowing me to participate in the care of this very pleasant patient.   Time Total: 25  Visit consisted of counseling and education dealing with the complex and emotionally intense issues of symptom management in the setting of serious illness.Greater than 50%  of this time was spent  counseling and coordinating care related to the above assessment and plan.  Signed by: Nelwyn Salisbury, PA-C

## 2022-04-19 ENCOUNTER — Inpatient Hospital Stay (HOSPITAL_BASED_OUTPATIENT_CLINIC_OR_DEPARTMENT_OTHER): Payer: Managed Care, Other (non HMO) | Admitting: Nurse Practitioner

## 2022-04-19 DIAGNOSIS — U071 COVID-19: Secondary | ICD-10-CM | POA: Diagnosis not present

## 2022-04-19 DIAGNOSIS — C3432 Malignant neoplasm of lower lobe, left bronchus or lung: Secondary | ICD-10-CM | POA: Diagnosis not present

## 2022-04-19 MED ORDER — HYDROCOD POLI-CHLORPHE POLI ER 10-8 MG/5ML PO SUER: 5.0000 mL | Freq: Two times a day (BID) | ORAL | 0 refills | Status: DC | PRN

## 2022-04-19 MED ORDER — NIRMATRELVIR/RITONAVIR (PAXLOVID)TABLET: 3.0000 | ORAL_TABLET | Freq: Two times a day (BID) | ORAL | 0 refills | Status: AC

## 2022-04-19 NOTE — Progress Notes (Signed)
Virtual Visit Progress Note  Sympt[facility] [place] [facility] [place]. [facility] [address], [place], [place] [address] [phone] (phone) [phone] (fax)  I connected with HERSEY MACLELLAN on 04/19/22 at  2:00 PM EDT by telephone visit and verified that I am speaking with the correct person using two identifiers.   I discussed the limitations, risks, security and privacy concerns of performing an evaluation and management service by telemedicine and the availability of in-person appointments. I also discussed with the patient that there may be a patient responsible charge related to this service. The patient expressed understanding and agreed to proceed.   Other persons participating in the visit and their role in the encounter: none   Patient's location: home  Provider's location: clinic   Chief Complaint: Covid    Patient Care Team: Pcp, No as PCP - General [name], RN as Oncology Nurse Navigator [name], MD as Consulting Physician (Internal Medicine)   Name of the patient: [name]  [id number]  March 28, 1981   Date of visit: 04/19/22  Diagnosis- Lung Cancer  Heme/Onc history:  Oncology History Overview Note  IMPRESSION: 1. Patchy nodular fat stranding throughout the anterior left upper quadrant peritoneal fat, nonspecific, cannot exclude peritoneal carcinomatosis. Dedicated CT abdomen/pelvis with oral and IV contrast recommended for further evaluation. 2. Dense patchy consolidation replacing much of the left lower lung lobe, appearing masslike in the superior segment left lower lobe, with associated bulging of the left major fissure and associated left lower lobe volume loss. Fine nodularity throughout both lungs with an upper lobe predominance. Asymmetric left upper lobe interlobular septal thickening. These findings are indeterminate, with  differential including multilobar pneumonia, sarcoidosis or a neoplastic process. The persistence on radiographs back to 01/20/2021 despite antibiotic therapy make sarcoidosis or a neoplastic process more likely. Pulmonology consultation suggested for consideration of bronchoscopic evaluation. 3. Small dependent left pleural effusion. 4. Mild mediastinal lymphadenopathy, nonspecific. 5. Subacute healing lateral right sixth rib fracture.   DIAGNOSIS:  A. LUNG, LEFT LOWER LOBE; ENB-ASSISTED BIOPSY:  - NON-SMALL CELL CARCINOMA, FAVOR ADENOCARCINOMA.  - FOREIGN MATERIAL SUGGESTIVE OF ASPIRATION.   # EGFR MUTATED: 66 del- June 28th, 2022- [name]. [limited]   Cancer of lower lobe of left lung ([place])  03/23/2021 Initial Diagnosis   Cancer of lower lobe of left lung ([place])   03/29/2021 Cancer Staging   Staging form: Lung, AJCC 8th Edition - Clinical: Stage IVB (cT3, cN3, pM1c) - Signed by [name], MD on 03/29/2021   03/30/2021 -  Chemotherapy    Patient is on Treatment Plan: LUNG NSCLC PEMETREXED (ALIMTA) / CARBOPLATIN Q21D X 1 CYCLES        Interval history- Started coughing Tuesday night. Had headache last night and cough that was worse. Was seen in [facility] yesterday. Continues to feel bad overall. Was instructed to hold tagrisso. Was vaccinated, 2 doses, last January 2022. Cough is most bothersome. Chills yesterday. No fever.   Review of systems- Review of Systems  Constitutional:  Positive for malaise/fatigue. Negative for chills, diaphoresis, fever and weight loss.  HENT:  Negative for congestion, ear discharge, ear pain, nosebleeds, sinus pain and sore throat.   Eyes:  Negative for pain and redness.  Respiratory:  Positive for cough. Negative for hemoptysis, sputum production, shortness of breath, wheezing and stridor.   Cardiovascular:  Negative for chest pain, palpitations and leg swelling.  Gastrointestinal:  Negative for abdominal pain,  constipation, diarrhea, nausea and vomiting.  Musculoskeletal:  Negative for falls, joint pain and myalgias.  Skin:  Negative for rash.  Neurological:  Positive for weakness. Negative for dizziness, loss of consciousness and headaches.  Psychiatric/Behavioral:  Negative for depression. The patient is not nervous/anxious and does not have insomnia.   All other systems reviewed and are negative.     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    Past Surgical History:  Procedure Laterality Date   FRACTURE SURGERY     broke femur when he was 6 years   PORTA CATH INSERTION N/A 03/28/2021   Procedure: PORTA CATH INSERTION;  Surgeon: Katha Cabal, MD;  Location: [facility];  Service: Cardiovascular;  Laterality: N/A;   VIDEO BRONCHOSCOPY WITH ENDOBRONCHIAL NAVIGATION N/A 03/15/2021   Procedure: VIDEO BRONCHOSCOPY WITH ENDOBRONCHIAL NAVIGATION;  Surgeon: Ottie Glazier, MD;  Location: [facility];  Service: Thoracic;  Laterality: N/A;   VIDEO BRONCHOSCOPY WITH ENDOBRONCHIAL ULTRASOUND N/A 03/15/2021   Procedure: VIDEO BRONCHOSCOPY WITH ENDOBRONCHIAL ULTRASOUND;  Surgeon: Ottie Glazier, MD;  Location: [facility];  Service: Thoracic;  Laterality: N/A;    Social History   Socioeconomic History   Marital status: Married    Spouse name: [name]   Number of children: 2   Years of education: Not on file   Highest education level: Not on file  Occupational History   Not on file  Tobacco Use   Smoking status: Never   Smokeless tobacco: Current    Types: Snuff, Chew  Vaping Use   Vaping Use: Never used  Substance and Sexual Activity   Alcohol use: Never   Drug use: Never   Sexual activity: Not on file  Other Topics Concern   Not on file  Social History Narrative   [place]; with wife; 2  daughters[12 and 22]; never smoked; rare alcohol. Work in [employer]    Social Determinants of Health   Financial Resource Strain: Not on file  Food Insecurity: Not on file  Transportation Needs: No Transportation Needs (03/16/2022)   PRAPARE - Hydrologist (Medical): No    Lack of Transportation (Non-Medical): No  Physical Activity: Not on file  Stress: Not on file  Social Connections: Not on file  Intimate Partner Violence: Not on file    Family History  Problem Relation Age of Onset   High Cholesterol Mother    Hypertension Father    Cancer Father    Lung cancer Father    Skin cancer Father      Current Outpatient Medications:    albuterol (VENTOLIN HFA) 108 (90 Base) MCG/ACT inhaler, Inhale 2 puffs into the lungs every 6 (six) hours as needed for wheezing or shortness of breath., Disp: 8 g, Rfl: 2   apixaban (ELIQUIS) 5 MG TABS tablet, Take 1 tablet (5 mg total) by mouth 2 (two) times daily., Disp: 60 tablet, Rfl: 2   benzonatate (TESSALON) 200 MG capsule, Take 1 capsule (200 mg total) by mouth 3 (three) times daily as needed for cough., Disp: 20 capsule, Rfl: 0   Calcium-Magnesium-Vitamin D (CALCIUM 1200+D3 PO), Take 1,200 mg by mouth 2 (two) times daily., Disp: , Rfl:  folic acid (FOLVITE) 1 MG tablet, Take 1 tablet (1 mg total) by mouth daily., Disp: 90 tablet, Rfl: 1   lidocaine-prilocaine (EMLA) cream, Apply 1 application topically as needed., Disp: 30 g, Rfl: 0   metoprolol succinate (TOPROL XL) 25 MG 24 hr tablet, Take 1 tablet (25 mg total) by mouth daily. (Patient taking differently: Take 50 mg by mouth daily.), Disp: 30 tablet, Rfl: 1   ondansetron (ZOFRAN) 8 MG tablet, One pill every 8 hours as needed for nausea/vomitting. (Patient not taking: Reported on 04/18/2022), Disp: 40 tablet, Rfl: 1   osimertinib mesylate (TAGRISSO) 80 MG tablet, TAKE 1 TABLET (80 MG TOTAL) DAILY, Disp: 30 tablet, Rfl: 4   predniSONE (DELTASONE) 10 MG tablet, Take 4  tablets by mouth with breakfast for 2 days, 2 tablets by mouth for 2 days and 1 tablet by mouth for 2 days., Disp: 14 tablet, Rfl: 0   prochlorperazine (COMPAZINE) 10 MG tablet, Take 1 tablet (10 mg total) by mouth every 6 (six) hours as needed for nausea or vomiting. (Patient not taking: Reported on 04/18/2022), Disp: 40 tablet, Rfl: 1   sertraline (ZOLOFT) 50 MG tablet, Take 0.5 tablets (25 mg total) by mouth daily., Disp: 30 tablet, Rfl: 2 No current facility-administered medications for this visit.  Facility-Administered Medications Ordered in Other Visits:    heparin lock flush 100 UNIT/ML injection, , , ,    heparin lock flush 100 UNIT/ML injection, , , ,    sodium chloride flush (NS) 0.9 % injection 10 mL, 10 mL, Intravenous, PRN, [name], MD, 10 mL at 07/28/21 [id number]  Physical exam: Exam limited due to telemedicine  There were no vitals filed for this visit. Physical Exam Pulmonary:     Comments: Strong cough. Full sentences. No audible wheezing or dyspnea.  Neurological:     Mental Status: He is alert and oriented to person, place, and time.         Latest Ref Rng & Units 04/18/2022    1:12 PM  CMP  Glucose 70 - 99 mg/dL 92   BUN 6 - 20 mg/dL 9   Creatinine 0.61 - 1.24 mg/dL 1.11   Sodium 135 - 145 mmol/L 137   Potassium 3.5 - 5.1 mmol/L 4.3   Chloride 98 - 111 mmol/L 106   CO2 22 - 32 mmol/L 26   Calcium 8.9 - 10.3 mg/dL 8.5       Latest Ref Rng & Units 04/18/2022    1:12 PM  CBC  WBC 4.0 - 10.5 K/uL 7.7   Hemoglobin 13.0 - 17.0 g/dL 14.1   Hematocrit 39.0 - 52.0 % 43.6   Platelets 150 - 400 K/uL 239     No images are attached to the encounter.  DG Chest 2 View  Result Date: 04/18/2022 CLINICAL DATA:  Cough.  History of metastatic lung cancer. EXAM: CHEST - 2 VIEW COMPARISON:  Chest radiograph 10/31/2021, chest CT 12/22/2021, and PET-CT 01/31/2022. FINDINGS: A right jugular Port-A-Cath remains in place, terminating over the lower SVC. The  cardiomediastinal silhouette is unchanged. A moderate to large, loculated left pleural effusion is unchanged. Underlying left parenchymal lung opacities have also not significantly changed in the setting of treated left lower lobe malignancy. The right lung is clear. No pneumothorax is identified. No acute osseous abnormality is seen. IMPRESSION: Unchanged appearance of the chest, including a moderate to large chronic loculated pleural effusion. Electronically Signed   By: Logan Bores M.D.   On: 04/18/2022 11:37  MR Brain W Wo Contrast  Result Date: 04/12/2022 CLINICAL DATA:  History of lung cancer, evaluate for metastatic disease EXAM: MRI HEAD WITHOUT AND WITH CONTRAST TECHNIQUE: Multiplanar, multiecho pulse sequences of the brain and surrounding structures were obtained without and with intravenous contrast. CONTRAST:  109m GADAVIST GADOBUTROL 1 MMOL/ML IV SOLN COMPARISON:  Brain MRI 10/18/2018 FINDINGS: Brain: There is no acute intracranial hemorrhage, extra-axial fluid collection, or acute infarct. Parenchymal volume is normal. The ventricles are normal in size. Gray-white differentiation is preserved. *Two enhancing lesions in the left frontal lobe best seen on the coronal postcontrast sequence measuring 6 mm x 5 mm and 3 mm x 3 mm (19-16, 19-15) are increased in size and conspicuity since 10/18/2018. *2-3 mm lesion in the right parietal lobe is slightly increased in size and conspicuity (18-116). *2 mm lesion in the right frontal lobe is slightly increased in size and conspicuity (18-116). *3 mm lesion in the medial right frontal lobe is not significantly changed (18-127). *4 mm lesion more inferolaterally in the right parietal lobe is stable (18-93). *Punctate lesion in the left occipital lobe is unchanged (21-18, 18-53). *2 mm lesion in the left parietal lobe in the precuneus was not definitely seen on the prior study (21-15). *No other definite new lesions are identified. Some of the lesions  demonstrate SWI signal dropout consistent with intralesional blood products similar to the prior study. There is no significant vasogenic edema and no mass effect or midline shift. Vascular: Normal flow voids. Skull and upper cervical spine: Normal marrow signal. Sinuses/Orbits: The paranasal sinuses are clear. The globes and orbits are unremarkable. Other: None. IMPRESSION: Numerous metastatic lesions in the supratentorial brain as above, one of which in the left parietal lobe was not definitely present on the prior studies and others have slightly increased/conspicuity in size since 10/18/2021. While some of the differences in conspicuity may be related to contrast timing/technique, the largest lesions in the left frontal lobe are noticeably increased in size. Electronically Signed   By: PValetta MoleM.D.   On: 04/12/2022 11:59    Assessment and plan- Patient is a 41y.o. male currently on tagrisso for metastatic lung cancer who has had positive home covid test.    1) Covid-19 Virus Infection - I advised patient to stay well hydrated - Cough that is persistent, interferes with sleep, or causes discomfort can be managed with an over-the-counter cough medication (eg, dextromethorphan) or prescription benzonatate, 100 to 200 mg orally three times daily as needed. Prescription sent. If symptoms persist, notify clinic for prescription cough syrup.  - I recommend rest as needed during the acute illness and ambulation is encouraged.  - take mucinex to thin secretions - take tylenol and/or ibuprofen per label as needed for pain/fever/ body aches. Do not exceed 3000 mg in 24 hour period of tylenol - Reviewed symptoms that would warrant ER or hospital care - encouraged patient to monitor oxygen levels with pulse oximeter, reviewed troubleshooting, and to seek ER for SpO2 < 94% - Start paxlovid. Labs reviewed. GFR > 60. No indication for renal dosing. 3 tablets BID x 5 days.  - Medications reviewed. Given  interaction with eliquis, he will reduce eliquis to 2.5 mg (half a tablet) every 12 hours while taking paxlovid. After completing paxlovid, he will resume normal dosing.  - Prescription for tussionex sent to pharmacy for cough. Reviewed narcotic precautions.  - Reviewed home isolation guidelines.   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 No WBRT,  bone mets, egfr mutated, on osimertinib. Hold tagrisso x 10 days.   Disposition:  See Dr. Rogue Bussing around July 17th with labs- la  Visit Diagnosis 1. COVID-19 virus infection   2. Cancer of lower lobe of left lung [facility])     Patient expressed understanding and was in agreement with this plan. He also understands that He can call clinic at any time with any questions, concerns, or complaints.   I discussed the assessment and treatment plan with the patient. The patient was provided an opportunity to ask questions and all were answered. The patient agreed with the plan and demonstrated an understanding of the instructions.   The patient was advised to call back or seek an in-person evaluation if the symptoms worsen or if the condition fails to improve as anticipated.  I spent 20 minutes on this telephone encounter.   Thank you for allowing me to participate in the care of this very pleasant patient.   Beckey Rutter, DNP, AGNP-C [facility]: Dr. Rogue Bussing

## 2022-04-19 NOTE — Addendum Note (Signed)
Addended by: Nelwyn Salisbury on: 04/19/2022 03:22 PM   Modules accepted: Orders

## 2022-04-19 NOTE — Telephone Encounter (Signed)
Please schedule patient with APP to discuss positive COVID and treatment.

## 2022-04-20 ENCOUNTER — Other Ambulatory Visit: Payer: Managed Care, Other (non HMO)

## 2022-04-20 ENCOUNTER — Ambulatory Visit: Payer: Managed Care, Other (non HMO) | Admitting: Internal Medicine

## 2022-04-20 ENCOUNTER — Telehealth: Payer: Self-pay

## 2022-04-20 NOTE — Telephone Encounter (Signed)
Fax received from [facility] that [facility] is needing PA.  The PA request was submitted via Cover My Meds (Key: [id number]) with determination: Drug is covered by current benefit plan. No further PA activity needed.  Spoke to pharmacy and confirmed that no PA is required and prescription did go thru.

## 2022-04-26 ENCOUNTER — Telehealth: Payer: Self-pay | Admitting: Internal Medicine

## 2022-04-26 ENCOUNTER — Other Ambulatory Visit: Payer: Self-pay | Admitting: Radiation Therapy

## 2022-04-26 ENCOUNTER — Encounter: Payer: Self-pay | Admitting: Internal Medicine

## 2022-04-26 DIAGNOSIS — C3432 Malignant neoplasm of lower lobe, left bronchus or lung: Secondary | ICD-10-CM

## 2022-04-26 NOTE — Telephone Encounter (Signed)
Unable to reach pt; left a voicemail.   Spoke to patient's wife-patient doing better from [place].  Also reviewed the results of the MRI brain. Discussed with Dr.vaslow   Discussed the evaluation with Dr.Vaslow. please schedule appt with Dr.Vaslow on 7/14.   Keep appointment with me as planned- GB

## 2022-04-27 ENCOUNTER — Telehealth: Payer: Self-pay | Admitting: Internal Medicine

## 2022-04-27 NOTE — Telephone Encounter (Signed)
Referral entered  

## 2022-04-27 NOTE — Addendum Note (Signed)
Addended by: Vanice Sarah on: 04/27/2022 08:46 AM   Modules accepted: Orders

## 2022-04-27 NOTE — Telephone Encounter (Signed)
Scheduled appt per 7/14 referral. Pt is aware of appt date and time. Pt is aware to arrive 15 mins prior to appt time and to bring and updated insurance card. Pt is aware of appt location.

## 2022-04-30 ENCOUNTER — Inpatient Hospital Stay: Payer: Managed Care, Other (non HMO)

## 2022-04-30 ENCOUNTER — Encounter: Payer: Self-pay | Admitting: Internal Medicine

## 2022-04-30 ENCOUNTER — Telehealth: Payer: Self-pay | Admitting: Radiation Therapy

## 2022-04-30 ENCOUNTER — Inpatient Hospital Stay (HOSPITAL_BASED_OUTPATIENT_CLINIC_OR_DEPARTMENT_OTHER): Payer: Managed Care, Other (non HMO) | Admitting: Internal Medicine

## 2022-04-30 ENCOUNTER — Other Ambulatory Visit: Payer: Self-pay | Admitting: Radiation Therapy

## 2022-04-30 DIAGNOSIS — M791 Myalgia, unspecified site: Secondary | ICD-10-CM

## 2022-04-30 DIAGNOSIS — C7931 Secondary malignant neoplasm of brain: Secondary | ICD-10-CM

## 2022-04-30 DIAGNOSIS — C3432 Malignant neoplasm of lower lobe, left bronchus or lung: Secondary | ICD-10-CM

## 2022-04-30 LAB — CBC WITH DIFFERENTIAL/PLATELET
Abs Immature Granulocytes: 0.11 10*3/uL — ABNORMAL HIGH (ref 0.00–0.07)
Basophils Absolute: 0.1 10*3/uL (ref 0.0–0.1)
Basophils Relative: 1 %
Eosinophils Absolute: 0.2 10*3/uL (ref 0.0–0.5)
Eosinophils Relative: 2 %
HCT: 43.7 % (ref 39.0–52.0)
Hemoglobin: 14.3 g/dL (ref 13.0–17.0)
Immature Granulocytes: 1 %
Lymphocytes Relative: 13 %
Lymphs Abs: 1.1 10*3/uL (ref 0.7–4.0)
MCH: 28.2 pg (ref 26.0–34.0)
MCHC: 32.7 g/dL (ref 30.0–36.0)
MCV: 86.2 fL (ref 80.0–100.0)
Monocytes Absolute: 0.8 10*3/uL (ref 0.1–1.0)
Monocytes Relative: 9 %
Neutro Abs: 6.5 10*3/uL (ref 1.7–7.7)
Neutrophils Relative %: 74 %
Platelets: 193 10*3/uL (ref 150–400)
RBC: 5.07 MIL/uL (ref 4.22–5.81)
RDW: 14.7 % (ref 11.5–15.5)
WBC: 8.8 10*3/uL (ref 4.0–10.5)
nRBC: 0 % (ref 0.0–0.2)

## 2022-04-30 LAB — COMPREHENSIVE METABOLIC PANEL
ALT: 44 U/L (ref 0–44)
AST: 39 U/L (ref 15–41)
Albumin: 3.8 g/dL (ref 3.5–5.0)
Alkaline Phosphatase: 106 U/L (ref 38–126)
Anion gap: 8 (ref 5–15)
BUN: 11 mg/dL (ref 6–20)
CO2: 19 mmol/L — ABNORMAL LOW (ref 22–32)
Calcium: 8.6 mg/dL — ABNORMAL LOW (ref 8.9–10.3)
Chloride: 106 mmol/L (ref 98–111)
Creatinine, Ser: 1.33 mg/dL — ABNORMAL HIGH (ref 0.61–1.24)
GFR, Estimated: >60 mL/min (ref >60)
Glucose, Bld: 86 mg/dL (ref 70–99)
Potassium: 4.1 mmol/L (ref 3.5–5.1)
Sodium: 133 mmol/L — ABNORMAL LOW (ref 135–145)
Total Bilirubin: 0.3 mg/dL (ref 0.3–1.2)
Total Protein: 7.3 g/dL (ref 6.5–8.1)

## 2022-04-30 LAB — CK: Total CK: 294 U/L (ref 49–397)

## 2022-04-30 NOTE — Progress Notes (Signed)
[place] NOTE  Patient Care Team: Pcp, No as PCP - General [name], RN as Oncology Nurse Navigator [name], MD as Consulting Physician (Internal Medicine)  CHIEF COMPLAINTS/PURPOSE OF CONSULTATION: Lung cancer  #  Oncology History Overview Note  IMPRESSION: 1. Patchy nodular fat stranding throughout the anterior left upper quadrant peritoneal fat, nonspecific, cannot exclude peritoneal carcinomatosis. Dedicated CT abdomen/pelvis with oral and IV contrast recommended for further evaluation. 2. Dense patchy consolidation replacing much of the left lower lung lobe, appearing masslike in the superior segment left lower lobe, with associated bulging of the left major fissure and associated left lower lobe volume loss. Fine nodularity throughout both lungs with an upper lobe predominance. Asymmetric left upper lobe interlobular septal thickening. These findings are indeterminate, with differential including multilobar pneumonia, sarcoidosis or a neoplastic process. The persistence on radiographs back to 01/20/2021 despite antibiotic therapy make sarcoidosis or a neoplastic process more likely. Pulmonology consultation suggested for consideration of bronchoscopic evaluation. 3. Small dependent left pleural effusion. 4. Mild mediastinal lymphadenopathy, nonspecific. 5. Subacute healing lateral right sixth rib fracture.   DIAGNOSIS:  A. LUNG, LEFT LOWER LOBE; ENB-ASSISTED BIOPSY:  - NON-SMALL CELL CARCINOMA, FAVOR ADENOCARCINOMA.  - FOREIGN MATERIAL SUGGESTIVE OF ASPIRATION.   # EGFR MUTATED: 18 del- June 28th, 2022- [name]. [limited]   Cancer of lower lobe of left lung ([place])  03/23/2021 Initial Diagnosis   Cancer of lower lobe of left lung ([place])   03/29/2021 Cancer Staging   Staging form: Lung, AJCC 8th Edition - Clinical: Stage IVB (cT3, cN3, pM1c) - Signed by [name], MD on 03/29/2021   03/30/2021 -  Chemotherapy     Patient is on Treatment Plan: LUNG NSCLC PEMETREXED (ALIMTA) / CARBOPLATIN Q21D X 1 CYCLES         HISTORY OF PRESENTING ILLNESS: Ambulating independently.  Accompanied by his wife.   Staci Righter 41 y.o.  male patient with stage IV lung cancer adenocarcinoma-brain mets [no WBRT]/bone mets EGFR mutated on osimertinib; asymptomatic metastatic disease to brain [no WBRT] is here for follow-up/review results of the MRI of the brain.  In the interim patient been diagnosed with COVID.  Patient had episode of cough fever joint aches body aches resolved without any immediate complications.  Patient s/p Paxlovid.  Denies any worsening pain. Patient continues to have intermittent groin pain especially with ambulation.  Otherwise no dyspnea no nausea no vomiting.  No worsening skin rash.  Complains of intermittent cramps in his legs.  Especially at nighttime.   Review of Systems  Constitutional:  Positive for malaise/fatigue. Negative for chills, diaphoresis and fever.  HENT:  Negative for nosebleeds and sore throat.   Eyes:  Negative for double vision.  Respiratory:  Negative for wheezing.   Cardiovascular:  Negative for chest pain, palpitations, orthopnea and leg swelling.  Gastrointestinal:  Negative for abdominal pain, blood in stool, diarrhea, heartburn, melena, nausea and vomiting.  Genitourinary:  Negative for dysuria, frequency and urgency.  Musculoskeletal:  Negative for back pain and joint pain.  Skin:  Negative for itching.  Neurological:  Positive for weakness. Negative for dizziness, tingling, focal weakness and headaches.  Endo/Heme/Allergies:  Does not bruise/bleed easily.  Psychiatric/Behavioral:  Negative for depression. The patient is not nervous/anxious and does not have insomnia.      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    SURGICAL  HISTORY: Past Surgical History:  Procedure Laterality Date   FRACTURE SURGERY     broke femur when he was 6 years   PORTA CATH INSERTION N/A 03/28/2021   Procedure: PORTA CATH INSERTION;  Surgeon: Katha Cabal, MD;  Location: [facility];  Service: Cardiovascular;  Laterality: N/A;   VIDEO BRONCHOSCOPY WITH ENDOBRONCHIAL NAVIGATION N/A 03/15/2021   Procedure: VIDEO BRONCHOSCOPY WITH ENDOBRONCHIAL NAVIGATION;  Surgeon: Ottie Glazier, MD;  Location: [facility];  Service: Thoracic;  Laterality: N/A;   VIDEO BRONCHOSCOPY WITH ENDOBRONCHIAL ULTRASOUND N/A 03/15/2021   Procedure: VIDEO BRONCHOSCOPY WITH ENDOBRONCHIAL ULTRASOUND;  Surgeon: Ottie Glazier, MD;  Location: [facility];  Service: Thoracic;  Laterality: N/A;    SOCIAL HISTORY: Social History   Socioeconomic History   Marital status: Married    Spouse name: [name]   Number of children: 2   Years of education: Not on file   Highest education level: Not on file  Occupational History   Not on file  Tobacco Use   Smoking status: Never   Smokeless tobacco: Current    Types: Snuff, Chew  Vaping Use   Vaping Use: Never used  Substance and Sexual Activity   Alcohol use: Never   Drug use: Never   Sexual activity: Not on file  Other Topics Concern   Not on file  Social History Narrative   [place]; with wife; 2 daughters[12 and 22]; never smoked; rare alcohol. Work in [employer]    Social Determinants of Health   Financial Resource Strain: Not on file  Food Insecurity: Not on file  Transportation Needs: No Transportation Needs (03/16/2022)   PRAPARE - Hydrologist (Medical): No    Lack of Transportation (Non-Medical): No  Physical Activity: Not on file  Stress: Not on file  Social Connections: Not on file  Intimate Partner Violence: Not on file    FAMILY HISTORY: Family History  Problem Relation Age of Onset   High Cholesterol Mother    Hypertension Father    Cancer  Father    Lung cancer Father    Skin cancer Father     ALLERGIES:  is allergic to codeine.  MEDICATIONS:  Current Outpatient Medications  Medication Sig Dispense Refill   albuterol (VENTOLIN HFA) 108 (90 Base) MCG/ACT inhaler Inhale 2 puffs into the lungs every 6 (six) hours as needed for wheezing or shortness of breath. 8 g 2   apixaban (ELIQUIS) 5 MG TABS tablet Take 1 tablet (5 mg total) by mouth 2 (two) times daily. 60 tablet 2   benzonatate (TESSALON) 200 MG capsule Take 1 capsule (200 mg total) by mouth 3 (three) times daily as needed for cough. 20 capsule 0   Calcium-Magnesium-Vitamin D (CALCIUM 1200+D3 PO) Take 1,200 mg by mouth 2 (two) times daily.     chlorpheniramine-HYDROcodone 10-8 MG/5ML Take 5 mLs by mouth every 12 (twelve) hours as needed for cough. 696 mL 0   folic acid (FOLVITE) 1 MG tablet Take 1 tablet (1 mg total) by mouth daily. 90 tablet 1   lidocaine-prilocaine (EMLA) cream Apply 1 application topically as needed. 30 g 0   ondansetron (ZOFRAN) 8 MG tablet One pill every 8 hours as needed for nausea/vomitting. 40 tablet 1   osimertinib mesylate (TAGRISSO) 80 MG tablet TAKE 1 TABLET (80 MG TOTAL) DAILY 30 tablet 4   predniSONE (DELTASONE) 10 MG tablet Take 4  tablets by mouth with breakfast for 2 days, 2 tablets by mouth for 2 days and 1 tablet by mouth for 2 days. 14 tablet 0   prochlorperazine (COMPAZINE) 10 MG tablet Take 1 tablet (10 mg total) by mouth every 6 (six) hours as needed for nausea or vomiting. 40 tablet 1   sertraline (ZOLOFT) 50 MG tablet Take 0.5 tablets (25 mg total) by mouth daily. 30 tablet 2   metoprolol succinate (TOPROL XL) 25 MG 24 hr tablet Take 1 tablet (25 mg total) by mouth daily. (Patient taking differently: Take 50 mg by mouth daily.) 30 tablet 1   No current facility-administered medications for this visit.   Facility-Administered Medications Ordered in Other Visits  Medication Dose Route Frequency Provider Last Rate Last Admin    heparin lock flush 100 UNIT/ML injection            heparin lock flush 100 UNIT/ML injection            sodium chloride flush (NS) 0.9 % injection 10 mL  10 mL Intravenous PRN [name], MD   10 mL at 07/28/21 0852      .  PHYSICAL EXAMINATION: ECOG PERFORMANCE STATUS: 1 - Symptomatic but completely ambulatory  Vitals:   04/30/22 1518  BP: 117/84  Pulse: 87  Temp: 98.2 F (36.8 C)  SpO2: 98%    Filed Weights   04/30/22 1518  Weight: 232 lb 6.4 oz (105.4 kg)      Physical Exam HENT:     Head: Normocephalic and atraumatic.     Mouth/Throat:     Pharynx: No oropharyngeal exudate.  Eyes:     Pupils: Pupils are equal, round, and reactive to light.  Cardiovascular:     Rate and Rhythm: Normal rate and regular rhythm.  Pulmonary:     Effort: No respiratory distress.     Breath sounds: No wheezing.     Comments: Decreased breath sound on the left side compared to right. Abdominal:     General: Bowel sounds are normal. There is no distension.     Palpations: Abdomen is soft. There is no mass.     Tenderness: There is no abdominal tenderness. There is no guarding or rebound.  Musculoskeletal:        General: No tenderness. Normal range of motion.     Cervical back: Normal range of motion and neck supple.  Skin:    General: Skin is warm.  Neurological:     Mental Status: He is alert and oriented to person, place, and time.  Psychiatric:        Mood and Affect: Affect normal.    Fungal dermatitis bilateral buttocks groin/underneath abdominal pannus  LABORATORY DATA:  I have reviewed the data as listed Lab Results  Component Value Date   WBC 8.8 04/30/2022   HGB 14.3 04/30/2022   HCT 43.7 04/30/2022   MCV 86.2 04/30/2022   PLT 193 04/30/2022   Recent Labs    02/02/22 0950 03/16/22 1527 04/18/22 1312 04/30/22 1511  NA 135 138 137 133*  K 3.9 3.8 4.3 4.1  CL 108 106 106 106  CO2 21* 24 26 19*  GLUCOSE 108* 133* 92 86  BUN '16 11 9 11   ' CREATININE 1.17 1.26* 1.11 1.33*  CALCIUM 8.2* 8.1* 8.5* 8.6*  GFRNONAA >60 >60 >60 >60  PROT 7.2 7.5  --  7.3  ALBUMIN 3.6 3.7  --  3.8  AST 35 46*  --  39  ALT 42 47*  --  44  ALKPHOS 132* 123  --  106  BILITOT 0.4 0.3  --  0.3    RADIOGRAPHIC STUDIES: I have personally reviewed the radiological images as listed and agreed with the findings in the report. DG Chest 2 View  Result Date: 04/18/2022 CLINICAL DATA:  Cough.  History of metastatic lung cancer. EXAM: CHEST - 2 VIEW COMPARISON:  Chest radiograph 10/31/2021, chest CT 12/22/2021, and PET-CT 01/31/2022. FINDINGS: A right jugular Port-A-Cath remains in place, terminating over the lower SVC. The cardiomediastinal silhouette is unchanged. A moderate to large, loculated left pleural effusion is unchanged. Underlying left parenchymal lung opacities have also not significantly changed in the setting of treated left lower lobe malignancy. The right lung is clear. No pneumothorax is identified. No acute osseous abnormality is seen. IMPRESSION: Unchanged appearance of the chest, including a moderate to large chronic loculated pleural effusion. Electronically Signed   By: Logan Bores M.D.   On: 04/18/2022 11:37   MR Brain W Wo Contrast  Result Date: 04/12/2022 CLINICAL DATA:  History of lung cancer, evaluate for metastatic disease EXAM: MRI HEAD WITHOUT AND WITH CONTRAST TECHNIQUE: Multiplanar, multiecho pulse sequences of the brain and surrounding structures were obtained without and with intravenous contrast. CONTRAST:  85m GADAVIST GADOBUTROL 1 MMOL/ML IV SOLN COMPARISON:  Brain MRI 10/18/2018 FINDINGS: Brain: There is no acute intracranial hemorrhage, extra-axial fluid collection, or acute infarct. Parenchymal volume is normal. The ventricles are normal in size. Gray-white differentiation is preserved. *Two enhancing lesions in the left frontal lobe best seen on the coronal postcontrast sequence measuring 6 mm x 5 mm and 3 mm x 3 mm (19-16,  19-15) are increased in size and conspicuity since 10/18/2018. *2-3 mm lesion in the right parietal lobe is slightly increased in size and conspicuity (18-116). *2 mm lesion in the right frontal lobe is slightly increased in size and conspicuity (18-116). *3 mm lesion in the medial right frontal lobe is not significantly changed (18-127). *4 mm lesion more inferolaterally in the right parietal lobe is stable (18-93). *Punctate lesion in the left occipital lobe is unchanged (21-18, 18-53). *2 mm lesion in the left parietal lobe in the precuneus was not definitely seen on the prior study (21-15). *No other definite new lesions are identified. Some of the lesions demonstrate SWI signal dropout consistent with intralesional blood products similar to the prior study. There is no significant vasogenic edema and no mass effect or midline shift. Vascular: Normal flow voids. Skull and upper cervical spine: Normal marrow signal. Sinuses/Orbits: The paranasal sinuses are clear. The globes and orbits are unremarkable. Other: None. IMPRESSION: Numerous metastatic lesions in the supratentorial brain as above, one of which in the left parietal lobe was not definitely present on the prior studies and others have slightly increased/conspicuity in size since 10/18/2021. While some of the differences in conspicuity may be related to contrast timing/technique, the largest lesions in the left frontal lobe are noticeably increased in size. Electronically Signed   By: PValetta MoleM.D.   On: 04/12/2022 11:59    ASSESSMENT & PLAN:   Can[place] of [place] lobe of left lung ([place] # STAGE IV- Left lower lobe lung cancer-adenocarcinoma the lung;  EGFR- 19 del POSITIVE. Currently on osimertinib 80 mg a day [osimertinib [since June 28th, 2022]. April 20th, 2023- PET scan- Overall findings suggest a good response to treatment. The large left lung mass has near completely resolved. The omental and peritoneal carcinomatosis has resolved. No residual  hypermetabolic bone metastasis. 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    #PET scan April 2023- new small hypermetabolic axillary nodes bilaterally could be inflammatory but are suspicious for metastatic disease. Would recommend imaging CT scan in next month. Order imaging at next visit consider biopsy if continues to grow.  For now continue osimertinib.  #Asymptomatic multiple brain mets subcentimeter asymptomatic [NO RT]-JUNE 29th, 2023- Numerous metastatic lesions in the supratentorial brain-increase in number also conspicuity compared to January 2023.  Discussed with Dr. Mickeal Skinner; currently awaiting appt with Dr.Vaslow- 7/20; rad-Onc on 7/21.   # COVIDCarlean Purl 4th]- s/p paxlovid- resolved currently.   #Left-sided pleural effusion-question reactive [July 2022]-vs malignancy; follows up with Dr. Raul Del; monitor for now.  Stable  #Skin rash on the torso-likely secondary to osimertinib.  Improved with Kenalog ointment- STABLE.  #Left lung PE [incidental on CT SEP 13th,2022-]?Symptomatic DVT of the right calf-on Eliquis-STABLE.  #Bone metastases- CT DEc 7th, 2022 to healing process.  Dental evaluation on 9/21.  Continue calcium vitamin D intake. calcium 8.1; ?  Osimertinib. HOLD zometa.   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 STABLE.  #Continued hypocalcemia- ?  Symptomatic cramps.  Vitamin D levels normal.  CK420-June; July 2023 with repeat CK-within normal limits.  Monitor for now  # IV access/poor access-: port flush.   # DISPOSITION: # follow up in 4 weeks- MD; port Reva Bores- cbc/cmp; CK total- Dr.B                 All questions were answered. The patient knows to call the clinic with any problems, questions or concerns.    [name], MD 04/30/2022 9:35 PM

## 2022-04-30 NOTE — Assessment & Plan Note (Addendum)
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 Currently on osimertinib 80 mg a day [osimertinib [since June 28th, 2022]. April 20th, 2023- PET scan- Overall findings suggest a good response to treatment. The large left lung mass has near completely resolved. The omental and peritoneal carcinomatosis has resolved. No residual hypermetabolic bone metastasis. 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    #PET scan April 2023- new small hypermetabolic axillary nodes bilaterally could be inflammatory but are suspicious for metastatic disease. Would recommend imaging CT scan in next month. Order imaging at next visit consider biopsy if continues to grow.  For now continue osimertinib.  #Asymptomatic multiple brain mets subcentimeter asymptomatic [NO RT]-JUNE 29th, 2023- Numerous metastatic lesions in the supratentorial brain-increase in number also conspicuity compared to January 2023.  Discussed with Dr. Mickeal Skinner; currently awaiting appt with Dr.Vaslow- 7/20; rad-Onc on 7/21.   # COVIDCarlean Purl 4th]- s/p paxlovid- resolved currently.   #Left-sided pleural effusion-question reactive [July 2022]-vs malignancy; follows up with Dr. Raul Del; monitor for now.  Stable  #Skin rash on the torso-likely secondary to osimertinib.  Improved with Kenalog ointment- STABLE.  #Left lung PE [incidental on CT SEP 13th,2022-]?Symptomatic DVT of the right calf-on Eliquis-STABLE.  #Bone metastases- CT DEc 7th, 2022 to healing process.  Dental evaluation on 9/21.  Continue calcium vitamin D intake. calcium 8.1; ?  Osimertinib. HOLD zometa.   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 STABLE.  #Continued hypocalcemia- ?  Symptomatic cramps.  Vitamin D levels normal.  CK420-June; July 2023 with repeat CK-within  normal limits.  Monitor for now  # IV access/poor access-: port flush.   # DISPOSITION: # follow up in 4 weeks- MD; port Reva Bores- cbc/cmp; CK total- Dr.B

## 2022-04-30 NOTE — Telephone Encounter (Signed)
Left a voice message with requesting a call back to discuss his upcoming oncology appointments here in [place].   Mont Dutton R.T.(R)(T) Radiation Special Procedures Navigator

## 2022-04-30 NOTE — Progress Notes (Signed)
Order entered for port to be accessed the day of his brain MRI at [facility].   Mont Dutton R.T(R)T) Radiation Special Procedures Navigator

## 2022-05-01 ENCOUNTER — Telehealth: Payer: Self-pay | Admitting: Pharmacy Technician

## 2022-05-01 LAB — ALDOLASE: Aldolase: 8.7 U/L (ref 3.3–10.3)

## 2022-05-01 NOTE — Telephone Encounter (Signed)
Oral Oncology Patient Advocate Encounter   Received notification that prior authorization for Tagrisso is due for renewal.   PA submitted on 05/01/2022 vis phone to [employer] phone number [phone] [place] [id number] Status is pending     [name], [place] Patient Advocate Specialist [place] Patient Advocate Team Direct Number: [phone]  Fax: [phone]

## 2022-05-02 ENCOUNTER — Other Ambulatory Visit (HOSPITAL_COMMUNITY): Payer: Self-pay

## 2022-05-02 NOTE — Progress Notes (Signed)
Radiation Oncology         [phone]) [phone] ________________________________  Initial Outpatient Consultation  Name: [name] MRN: [id number]  Date: 05/04/2022  DOB: [birth year]  CC:Pcp, No  [name], *   REFERRING PHYSICIAN: Charlaine Dalton R, *  DIAGNOSIS:    ICD-10-CM   1. Metastasis to brain [facility])  C79.31     2. 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 male who was diagnosed with left lower lobe lung cancer in June of 2022 (biopsy on 03/15/21 showed non-small cell carcinoma  favoring adenocarcinoma with lymph node metastases); s/p 1 cycle of chemotherapy consisting of almita/carboplatin followed by osimertinib. The patient was also found to have numerous scattered intraparenchymal metastases on MRI of the brain dated 03/28/2021, as well as diffuse bone metastases on PET dated 03/27/2021 (involving the bilateral scapulae, bilateral ribs, thoracic and lumbosacral spine, and pelvis).   Following systemic treatment, thoracentesis performed on 05/03/21 showed no evidence of malignancy.   Follow up MRI of the brain on 05/30/21 showed a response to systemic treatment, demonstrated by interval resolution - significant decrease in the size of the previously noted contrast-enhancing lesions in the brain. No new metastatic lesions were appreciated.  However, MRI of the brain on 08/21/21 showed an interval mixed treatment response, defined by a decrease  in size of 1 lesion, no change in size of 3 lesions, and an increase ins ize of 2 lesions.   MRI of the brain on 10/18/21 showed a stable to slight decrease in size of 6 enhancing brain metastases. No new or progressive disease was appreciated.   Left sided thoracentesis performed on 10/31/21 showed no evidence of malignancy.   CT of the chest abdomen and pelvis performed on 12/22/21 showed: no significant interval change in post treatment appearance of the dependent left lower lobe mass; no significant interval change in the moderate to large left pleural effusion with associated smooth pleural thickening and atelectasis/consolidation (again highly suspicious for malignant pleural effusion); a mixed response with overall decreased or stable size of the index/non indexed right lung pulmonary nodules; a few new pulmonary nodules in the right lower lobe; and no significant interval change in numerous scattered small sclerotic lesions throughout the included osseous structures consistent with treated osseous metastatic disease. Otherwise, CT showed no evidence of soft tissue metastatic disease within the abdomen or pelvis  Restaging PET scan performed on 01/31/22 showed findings suggestive of a good response to treatment, demonstrated by near complete resolution of the left lung mass, and no residual hypermetabolic bone metastases. 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 Lastly, PET showed new small hypermetabolic axillary nodes bilaterally, possibly suspicious for metastatic disease.  His most recent brain MRI on 04/12/22 showed numerous metastatic lesions in the supratentorial brain, with one new left parietal lobe lesion (not definitely present  on prior studies) as well as other lesions displaying a slight increase / conspicuity in size since 10/18/2021. The  largest lesions in the left frontal lobe were notably increased in size.   Accordingly, the patient followed up with Dr. Mickeal Skinner recently on 05/03/21. During which time, the patient was noted to be asymptomatic from a disease standpoint. In terms of treatment options, the patient expressed interest in proceeding with radiosurgery in management of his metastatic disease to the brain.    I have personally reviewed his images, appreciating the waxing and waning of his brain metastases over time while on [place].  He worked 6-7 days a week and lives in [place].  Pain on a scale of 0-10 is: Patient denies   Dose of Decadron, if applicable: Per Dr. Mickeal Skinner, not indicated at this time  Recent neurologic symptoms, if any:  Seizures: None Headaches: Denies Nausea: Denies Dizziness/ataxia: Denies Difficulty with hand coordination: None Focal numbness/weakness: Denies Visual deficits/changes: Denies Confusion/Memory deficits: Denies  Ambulatory status? Walker? Wheelchair?: Ambulatory without assistance   SAFETY ISSUES: Prior radiation? No Pacemaker/ICD? No Possible current pregnancy? N/A Is the patient on methotrexate? No  Additional Complaints / other details:  Nothing else of note - here w/ his wife who is very supportive    PREVIOUS RADIATION THERAPY: No  PAST MEDICAL HISTORY:  has a past medical history of Cancer ([place]), Dyspnea, Heartburn, Pneumonia, and Wolff-Parkinson-White (WPW) syndrome.    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   PORTA CATH INSERTION N/A 03/28/2021   Procedure: PORTA CATH INSERTION;  Surgeon: Katha Cabal, MD;  Location: [facility];  Service: Cardiovascular;  Laterality: N/A;   VIDEO BRONCHOSCOPY WITH ENDOBRONCHIAL NAVIGATION N/A 03/15/2021   Procedure: VIDEO BRONCHOSCOPY WITH ENDOBRONCHIAL NAVIGATION;  Surgeon: Ottie Glazier, MD;  Location: [facility];   Service: Thoracic;  Laterality: N/A;   VIDEO BRONCHOSCOPY WITH ENDOBRONCHIAL ULTRASOUND N/A 03/15/2021   Procedure: VIDEO BRONCHOSCOPY WITH ENDOBRONCHIAL ULTRASOUND;  Surgeon: Ottie Glazier, MD;  Location: [facility];  Service: Thoracic;  Laterality: N/A;    FAMILY HISTORY: family history includes Cancer in his father; High Cholesterol in his mother; Hypertension in his father; Lung cancer in his father; Skin cancer in his father.  SOCIAL HISTORY:  reports that he has never smoked. His smokeless tobacco use includes snuff and chew. He reports that he does not drink alcohol and does not use drugs.  ALLERGIES: Codeine  MEDICATIONS:  Current Outpatient Medications  Medication Sig Dispense Refill   apixaban (ELIQUIS) 5 MG TABS tablet Take 1 tablet (5 mg total) by mouth 2 (two) times daily. 60 tablet 2   folic acid (FOLVITE) 1 MG tablet Take 1 tablet (1 mg total) by mouth daily. 90 tablet 1   osimertinib mesylate (TAGRISSO) 80 MG tablet TAKE 1 TABLET (80 MG TOTAL) DAILY 30 tablet 4   sertraline (ZOLOFT) 50 MG tablet Take 0.5 tablets (25 mg total) by mouth daily. 30 tablet 2   albuterol (VENTOLIN HFA) 108 (90 Base) MCG/ACT inhaler Inhale 2 puffs into the lungs every 6 (six) hours as needed for wheezing or shortness of breath. (Patient not taking: Reported on 05/03/2022) 8 g 2   benzonatate (TESSALON) 200 MG capsule Take 1 capsule (200 mg total) by mouth 3 (three) times daily as needed for cough. (Patient not taking: Reported on 05/03/2022) 20 capsule 0   Calcium-Magnesium-Vitamin D (CALCIUM 1200+D3 PO)  Take 1,200 mg by mouth 2 (two) times daily. (Patient not taking: Reported on 05/03/2022)     chlorpheniramine-HYDROcodone 10-8 MG/5ML Take 5 mLs by mouth every 12 (twelve) hours as needed for cough. (Patient not taking: Reported on 05/03/2022) 115 mL 0   lidocaine-prilocaine (EMLA) cream Apply 1 application topically as needed. (Patient not taking: Reported on 05/03/2022) 30 g 0   metoprolol succinate  (TOPROL XL) 25 MG 24 hr tablet Take 1 tablet (25 mg total) by mouth daily. (Patient taking differently: Take 50 mg by mouth daily.) 30 tablet 1   ondansetron (ZOFRAN) 8 MG tablet One pill every 8 hours as needed for nausea/vomitting. (Patient not taking: Reported on 05/03/2022) 40 tablet 1   prochlorperazine (COMPAZINE) 10 MG tablet Take 1 tablet (10 mg total) by mouth every 6 (six) hours as needed for nausea or vomiting. (Patient not taking: Reported on 05/03/2022) 40 tablet 1   No current facility-administered medications for this encounter.   Facility-Administered Medications Ordered in Other Encounters  Medication Dose Route Frequency Provider Last Rate Last Admin   heparin lock flush 100 UNIT/ML injection            heparin lock flush 100 UNIT/ML injection            sodium chloride flush (NS) 0.9 % injection 10 mL  10 mL Intravenous PRN [name], MD   10 mL at 07/28/21 0852    REVIEW OF SYSTEMS:  Notable for that above.   PHYSICAL EXAM:  weight is 233 lb (105.7 kg). His oral temperature is 98.1 F (36.7 C). His blood pressure is 136/92 (abnormal) and his pulse is 78. His respiration is 18 and oxygen saturation is 98%.   General: Alert and oriented, in no acute distress  HEENT: Head is normocephalic. Extraocular movements are intact. Oropharynx is clear. Neck: Neck is supple, no palpable cervical or supraclavicular lymphadenopathy. Heart: Regular in rate and rhythm with no murmurs, rubs, or gallops. Chest: Clear to auscultation bilaterally, with no rhonchi, wheezes, or rales. Abdomen: Soft, nontender, nondistended, with no rigidity or guarding. Extremities: No cyanosis or edema. Lymphatics: see Neck Exam Skin: No concerning lesions. Musculoskeletal: symmetric strength and muscle tone throughout. Neurologic: Object recall 3/3 at 5 min. Cranial nerves II through XII are grossly intact. No obvious focalities. Speech is fluent. Coordination is intact. Psychiatric: Judgment and  insight are intact. Affect is appropriate.  KPS = 90  100 - Normal; no complaints; no evidence of disease. 90   - Able to carry on normal activity; minor signs or symptoms of disease. 80   - Normal activity with effort; some signs or symptoms of disease. 61   - Cares for self; unable to carry on normal activity or to do active work. 60   - Requires occasional assistance, but is able to care for most of his personal needs. 50   - Requires considerable assistance and frequent medical care. 84   - Disabled; requires special care and assistance. 54   - Severely disabled; hospital admission is indicated although death not imminent. 96   - Very sick; hospital admission necessary; active supportive treatment necessary. 10   - Moribund; fatal processes progressing rapidly. 0     - Dead  Karnofsky DA, Abelmann WH, Craver LS and Burchenal [facility] [phone]) The use of the nitrogen mustards in the palliative treatment of carcinoma: with particular reference to bronchogenic carcinoma Cancer 1 634-56   LABORATORY DATA:  Lab Results  Component Value Date  WBC 8.8 04/30/2022   HGB 14.3 04/30/2022   HCT 43.7 04/30/2022   MCV 86.2 04/30/2022   PLT 193 04/30/2022   CMP     Component Value Date/Time   NA 133 (L) 04/30/2022 1511   K 4.1 04/30/2022 1511   CL 106 04/30/2022 1511   CO2 19 (L) 04/30/2022 1511   GLUCOSE 86 04/30/2022 1511   BUN 11 04/30/2022 1511   CREATININE 1.33 (H) 04/30/2022 1511   CALCIUM 8.6 (L) 04/30/2022 1511   PROT 7.3 04/30/2022 1511   ALBUMIN 3.8 04/30/2022 1511   AST 39 04/30/2022 1511   ALT 44 04/30/2022 1511   ALKPHOS 106 04/30/2022 1511   BILITOT 0.3 04/30/2022 1511   GFRNONAA >60 04/30/2022 1511         RADIOGRAPHY: DG Chest 2 View  Result Date: 04/18/2022 CLINICAL DATA:  Cough.  History of metastatic lung cancer. EXAM: CHEST - 2 VIEW COMPARISON:  Chest radiograph 10/31/2021, chest CT 12/22/2021, and PET-CT 01/31/2022. FINDINGS: A right jugular Port-A-Cath remains  in place, terminating over the lower SVC. The cardiomediastinal silhouette is unchanged. A moderate to large, loculated left pleural effusion is unchanged. Underlying left parenchymal lung opacities have also not significantly changed in the setting of treated left lower lobe malignancy. The right lung is clear. No pneumothorax is identified. No acute osseous abnormality is seen. IMPRESSION: Unchanged appearance of the chest, including a moderate to large chronic loculated pleural effusion. Electronically Signed   By: Logan Bores M.D.   On: 04/18/2022 11:37   MR Brain W Wo Contrast  Result Date: 04/12/2022 CLINICAL DATA:  History of lung cancer, evaluate for metastatic disease EXAM: MRI HEAD WITHOUT AND WITH CONTRAST TECHNIQUE: Multiplanar, multiecho pulse sequences of the brain and surrounding structures were obtained without and with intravenous contrast. CONTRAST:  71mL GADAVIST GADOBUTROL 1 MMOL/ML IV SOLN COMPARISON:  Brain MRI 10/18/2018 FINDINGS: Brain: There is no acute intracranial hemorrhage, extra-axial fluid collection, or acute infarct. Parenchymal volume is normal. The ventricles are normal in size. Gray-white differentiation is preserved. *Two enhancing lesions in the left frontal lobe best seen on the coronal postcontrast sequence measuring 6 mm x 5 mm and 3 mm x 3 mm (19-16, 19-15) are increased in size and conspicuity since 10/18/2018. *2-3 mm lesion in the right parietal lobe is slightly increased in size and conspicuity (18-116). *2 mm lesion in the right frontal lobe is slightly increased in size and conspicuity (18-116). *3 mm lesion in the medial right frontal lobe is not significantly changed (18-127). *4 mm lesion more inferolaterally in the right parietal lobe is stable (18-93). *Punctate lesion in the left occipital lobe is unchanged (21-18, 18-53). *2 mm lesion in the left parietal lobe in the precuneus was not definitely seen on the prior study (21-15). *No other definite new lesions  are identified. Some of the lesions demonstrate SWI signal dropout consistent with intralesional blood products similar to the prior study. There is no significant vasogenic edema and no mass effect or midline shift. Vascular: Normal flow voids. Skull and upper cervical spine: Normal marrow signal. Sinuses/Orbits: The paranasal sinuses are clear. The globes and orbits are unremarkable. Other: None. IMPRESSION: Numerous metastatic lesions in the supratentorial brain as above, one of which in the left parietal lobe was not definitely present on the prior studies and others have slightly increased/conspicuity in size since 10/18/2021. While some of the differences in conspicuity may be related to contrast timing/technique, the largest lesions in the left frontal lobe are  noticeably increased in size. Electronically Signed   By: Valetta Mole M.D.   On: 04/12/2022 11:59      IMPRESSION/PLAN: Brain Metastases  Today, I talked to the patient about the findings and work-up thus far.  We discussed the patient's diagnosis of multiple brain metastases with partial response to systemic therapy and general treatment for this, highlighting the role of radiotherapy in the management.  We discussed the available radiation techniques, and focused on the details of logistics and delivery.     We discussed the pros and cons of SRS vs WBRT. We discussed the CE.7 trial that he is eligible for. He is adamant to avoid WBRT, even if it's given with hippocampal sparing and Memantine.  Therefore, I recommend SRS off-trial. We discussed the risks, benefits, and side effects of radiosurgery to the brain. Side effects may include but not necessarily be limited to: HA, seizure, fatigue, brain injury, inflammation or necrosis of the brain, possible need for surgical removal of tissue.  No guarantees of treatment were given. A consent form was signed and placed in the patient's medical record. The patient was encouraged to ask questions that  I answered to the best of my ability.    He has a vacation soon and wishes to delay treatment until he is a back. I think this is fine. Will push back 3T MRI and CT simulation for the week of August 14th.    On date of service, in total, I spent 60 minutes on this encounter. Patient was seen in person.   __________________________________________   Eppie Gibson, MD  This document serves as a record of services personally performed by Eppie Gibson, MD. It was created on her behalf by Roney Mans, a trained medical scribe. The creation of this record is based on the scribe's personal observations and the provider's statements to them. This document has been checked and approved by the attending provider.

## 2022-05-03 ENCOUNTER — Inpatient Hospital Stay (HOSPITAL_BASED_OUTPATIENT_CLINIC_OR_DEPARTMENT_OTHER): Payer: Managed Care, Other (non HMO) | Admitting: Internal Medicine

## 2022-05-03 ENCOUNTER — Other Ambulatory Visit (HOSPITAL_COMMUNITY): Payer: Self-pay

## 2022-05-03 ENCOUNTER — Other Ambulatory Visit: Payer: Self-pay

## 2022-05-03 VITALS — BP 121/85 | HR 70 | Temp 97.9°F | Resp 17 | Ht 70.0 in | Wt 233.0 lb

## 2022-05-03 DIAGNOSIS — C7931 Secondary malignant neoplasm of brain: Secondary | ICD-10-CM

## 2022-05-03 DIAGNOSIS — C3432 Malignant neoplasm of lower lobe, left bronchus or lung: Secondary | ICD-10-CM | POA: Diagnosis not present

## 2022-05-03 NOTE — Telephone Encounter (Signed)
Oral Oncology Patient Advocate Encounter  Prior Authorization for [name] has been approved.    PA# [id number] Effective dates: 05/01/2022 through 05/03/2023  Patient may continue to fill at [facility].    [name], CPhT-Adv Pharmacy Patient Advocate Specialist [place] Patient Advocate Team Direct Number: [phone]  Fax: [phone]

## 2022-05-03 NOTE — Progress Notes (Signed)
[facility] [place], [place] [address] [phone]   New Patient Evaluation  Date of Service: 05/03/22 Patient Name: [name] Patient MRN: [id number] Patient DOB: Feb 18, 1981 Provider: Ventura Sellers, MD  Identifying Statement:  [name] is a 41 y.o. male with Malignant neoplasm metastatic to brain [name]) who presents for initial consultation and evaluation regarding cancer associated neurologic deficits.    Referring Provider: [name], MD [address] [place],  [place] [address]  Primary Cancer:  Oncologic History: Oncology History Overview Note  IMPRESSION: 1. Patchy nodular fat stranding throughout the anterior left upper quadrant peritoneal fat, nonspecific, cannot exclude peritoneal carcinomatosis. Dedicated CT abdomen/pelvis with oral and IV contrast recommended for further evaluation. 2. Dense patchy consolidation replacing much of the left lower lung lobe, appearing masslike in the superior segment left lower lobe, with associated bulging of the left major fissure and associated left lower lobe volume loss. Fine nodularity throughout both lungs with an upper lobe predominance. Asymmetric left upper lobe interlobular septal thickening. These findings are indeterminate, with differential including multilobar pneumonia, sarcoidosis or a neoplastic process. The persistence on radiographs back to 01/20/2021 despite antibiotic therapy make sarcoidosis or a neoplastic process more likely. Pulmonology consultation suggested for consideration of bronchoscopic evaluation. 3. Small dependent left pleural effusion. 4. Mild mediastinal lymphadenopathy, nonspecific. 5. Subacute healing lateral right sixth rib fracture.   DIAGNOSIS:  A. LUNG, LEFT LOWER LOBE; ENB-ASSISTED BIOPSY:  - NON-SMALL CELL CARCINOMA, FAVOR ADENOCARCINOMA.  - FOREIGN MATERIAL SUGGESTIVE OF ASPIRATION.   # EGFR  MUTATED: 53 del- June 28th, 2022- [name]. [limited]   Cancer of lower lobe of left lung ([place])  03/23/2021 Initial Diagnosis   Cancer of lower lobe of left lung ([place])   03/29/2021 Cancer Staging   Staging form: Lung, AJCC 8th Edition - Clinical: Stage IVB (cT3, cN3, pM1c) - Signed by [name], MD on 03/29/2021   03/30/2021 -  Chemotherapy    Patient is on Treatment Plan: LUNG NSCLC PEMETREXED (ALIMTA) / CARBOPLATIN Q21D X 1 CYCLES        History of Present Illness: The patient's records from the referring physician were obtained and reviewed and the patient interviewed to confirm this HPI.  [name] presents today to review recent finding of progressive brain metastases from lung cancer.  Mets were originally identified in June 2022, he was asymptomatic at that time.  Decision was made to monitor with serial imaging given EGFR status and utilization of tagrisso through Dr. Rogue Bussing.  Recent MRI demonstrates progression of disease in the brain, but he remains asymptomatic.    Medications: Current Outpatient Medications on File Prior to Visit  Medication Sig Dispense Refill   albuterol (VENTOLIN HFA) 108 (90 Base) MCG/ACT inhaler Inhale 2 puffs into the lungs every 6 (six) hours as needed for wheezing or shortness of breath. 8 g 2   apixaban (ELIQUIS) 5 MG TABS tablet Take 1 tablet (5 mg total) by mouth 2 (two) times daily. 60 tablet 2   benzonatate (TESSALON) 200 MG capsule Take 1 capsule (200 mg total) by mouth 3 (three) times daily as needed for cough. 20 capsule 0   Calcium-Magnesium-Vitamin D (CALCIUM 1200+D3 PO) Take 1,200 mg by mouth 2 (two) times daily.     chlorpheniramine-HYDROcodone 10-8 MG/5ML Take 5 mLs by mouth every 12 (twelve) hours as needed for cough. 244 mL 0   folic acid (FOLVITE) 1 MG tablet Take  1 tablet (1 mg total) by mouth daily. 90 tablet 1   lidocaine-prilocaine (EMLA) cream Apply 1 application topically as needed. 30 g 0   metoprolol  succinate (TOPROL XL) 25 MG 24 hr tablet Take 1 tablet (25 mg total) by mouth daily. (Patient taking differently: Take 50 mg by mouth daily.) 30 tablet 1   ondansetron (ZOFRAN) 8 MG tablet One pill every 8 hours as needed for nausea/vomitting. 40 tablet 1   osimertinib mesylate (TAGRISSO) 80 MG tablet TAKE 1 TABLET (80 MG TOTAL) DAILY 30 tablet 4   predniSONE (DELTASONE) 10 MG tablet Take 4 tablets by mouth with breakfast for 2 days, 2 tablets by mouth for 2 days and 1 tablet by mouth for 2 days. 14 tablet 0   prochlorperazine (COMPAZINE) 10 MG tablet Take 1 tablet (10 mg total) by mouth every 6 (six) hours as needed for nausea or vomiting. 40 tablet 1   sertraline (ZOLOFT) 50 MG tablet Take 0.5 tablets (25 mg total) by mouth daily. 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   Past Surgical History:  Past Surgical History:  Procedure Laterality Date   FRACTURE SURGERY     broke femur when he was 6 years   PORTA CATH INSERTION N/A 03/28/2021   Procedure: PORTA CATH INSERTION;  Surgeon: Katha Cabal, MD;  Location: [facility];  Service: Cardiovascular;  Laterality: N/A;   VIDEO BRONCHOSCOPY WITH ENDOBRONCHIAL NAVIGATION N/A 03/15/2021   Procedure: VIDEO BRONCHOSCOPY WITH ENDOBRONCHIAL NAVIGATION;  Surgeon: Ottie Glazier, MD;  Location: [facility];  Service: Thoracic;  Laterality: N/A;   VIDEO  BRONCHOSCOPY WITH ENDOBRONCHIAL ULTRASOUND N/A 03/15/2021   Procedure: VIDEO BRONCHOSCOPY WITH ENDOBRONCHIAL ULTRASOUND;  Surgeon: Ottie Glazier, MD;  Location: [facility];  Service: Thoracic;  Laterality: N/A;   Social History:  Social History   Socioeconomic History   Marital status: Married    Spouse name: [name]   Number of children: 2   Years of education: Not on file   Highest education level: Not on file  Occupational History   Not on file  Tobacco Use   Smoking status: Never   Smokeless tobacco: Current    Types: Snuff, Chew  Vaping Use   Vaping Use: Never used  Substance and Sexual Activity   Alcohol use: Never   Drug use: Never   Sexual activity: Not on file  Other Topics Concern   Not on file  Social History Narrative   [place]; with wife; 2 daughters[12 and 22]; never smoked; rare alcohol. Work in [employer]    Social Determinants of Health   Financial Resource Strain: Not on file  Food Insecurity: Not on file  Transportation Needs:  No Transportation Needs (03/16/2022)   PRAPARE - Hydrologist (Medical): No    Lack of Transportation (Non-Medical): No  Physical Activity: Not on file  Stress: Not on file  Social Connections: Not on file  Intimate Partner Violence: Not on file   Family History:  Family History  Problem Relation Age of Onset   High Cholesterol Mother    Hypertension Father    Cancer Father    Lung cancer Father    Skin cancer Father     Review of Systems: Constitutional: Doesn't report fevers, chills or abnormal weight loss Eyes: Doesn't report blurriness of vision Ears, nose, mouth, throat, and face: Doesn't report sore throat Respiratory: Doesn't report cough, dyspnea or wheezes Cardiovascular: Doesn't report palpitation, chest discomfort  Gastrointestinal:  Doesn't report nausea, constipation, diarrhea GU: Doesn't report incontinence Skin: Doesn't report skin rashes Neurological: Per  HPI Musculoskeletal: Doesn't report joint pain Behavioral/Psych: Doesn't report anxiety  Physical Exam: Vitals:   05/03/22 1432  BP: 121/85  Pulse: 70  Resp: 17  Temp: 97.9 F (36.6 C)  SpO2: 99%   KPS: 90. General: Alert, cooperative, pleasant, in no acute distress Head: Normal EENT: No conjunctival injection or scleral icterus.  Lungs: Resp effort normal Cardiac: Regular rate Abdomen: Non-distended abdomen Skin: No rashes cyanosis or petechiae. Extremities: No clubbing or edema  Neurologic Exam: Mental Status: Awake, alert, attentive to examiner. Oriented to self and environment. Language is fluent with intact comprehension.  Cranial Nerves: Visual acuity is grossly normal. Visual fields are full. Extra-ocular movements intact. No ptosis. Face is symmetric Motor: Tone and bulk are normal. Power is full in both arms and legs. Reflexes are symmetric, no pathologic reflexes present.  Sensory: Intact to light touch Gait: Normal.   Labs: I have reviewed the data as listed    Component Value Date/Time   NA 133 (L) 04/30/2022 1511   K 4.1 04/30/2022 1511   CL 106 04/30/2022 1511   CO2 19 (L) 04/30/2022 1511   GLUCOSE 86 04/30/2022 1511   BUN 11 04/30/2022 1511   CREATININE 1.33 (H) 04/30/2022 1511   CALCIUM 8.6 (L) 04/30/2022 1511   PROT 7.3 04/30/2022 1511   ALBUMIN 3.8 04/30/2022 1511   AST 39 04/30/2022 1511   ALT 44 04/30/2022 1511   ALKPHOS 106 04/30/2022 1511   BILITOT 0.3 04/30/2022 1511   GFRNONAA >60 04/30/2022 1511   Lab Results  Component Value Date   WBC 8.8 04/30/2022   NEUTROABS 6.5 04/30/2022   HGB 14.3 04/30/2022   HCT 43.7 04/30/2022   MCV 86.2 04/30/2022   PLT 193 04/30/2022    Imaging:  DG Chest 2 View  Result Date: 04/18/2022 CLINICAL DATA:  Cough.  History of metastatic lung cancer. EXAM: CHEST - 2 VIEW COMPARISON:  Chest radiograph 10/31/2021, chest CT 12/22/2021, and PET-CT 01/31/2022. FINDINGS: A right jugular Port-A-Cath remains in  place, terminating over the lower SVC. The cardiomediastinal silhouette is unchanged. A moderate to large, loculated left pleural effusion is unchanged. Underlying left parenchymal lung opacities have also not significantly changed in the setting of treated left lower lobe malignancy. The right lung is clear. No pneumothorax is identified. No acute osseous abnormality is seen. IMPRESSION: Unchanged appearance of the chest, including a moderate to large chronic loculated pleural effusion. Electronically Signed   By: Logan Bores M.D.   On: 04/18/2022 11:37   MR Brain W Wo Contrast  Result Date: 04/12/2022 CLINICAL DATA:  History of lung cancer,  evaluate for metastatic disease EXAM: MRI HEAD WITHOUT AND WITH CONTRAST TECHNIQUE: Multiplanar, multiecho pulse sequences of the brain and surrounding structures were obtained without and with intravenous contrast. CONTRAST:  26m GADAVIST GADOBUTROL 1 MMOL/ML IV SOLN COMPARISON:  Brain MRI 10/18/2018 FINDINGS: Brain: There is no acute intracranial hemorrhage, extra-axial fluid collection, or acute infarct. Parenchymal volume is normal. The ventricles are normal in size. Gray-white differentiation is preserved. *Two enhancing lesions in the left frontal lobe best seen on the coronal postcontrast sequence measuring 6 mm x 5 mm and 3 mm x 3 mm (19-16, 19-15) are increased in size and conspicuity since 10/18/2018. *2-3 mm lesion in the right parietal lobe is slightly increased in size and conspicuity (18-116). *2 mm lesion in the right frontal lobe is slightly increased in size and conspicuity (18-116). *3 mm lesion in the medial right frontal lobe is not significantly changed (18-127). *4 mm lesion more inferolaterally in the right parietal lobe is stable (18-93). *Punctate lesion in the left occipital lobe is unchanged (21-18, 18-53). *2 mm lesion in the left parietal lobe in the precuneus was not definitely seen on the prior study (21-15). *No other definite new lesions  are identified. Some of the lesions demonstrate SWI signal dropout consistent with intralesional blood products similar to the prior study. There is no significant vasogenic edema and no mass effect or midline shift. Vascular: Normal flow voids. Skull and upper cervical spine: Normal marrow signal. Sinuses/Orbits: The paranasal sinuses are clear. The globes and orbits are unremarkable. Other: None. IMPRESSION: Numerous metastatic lesions in the supratentorial brain as above, one of which in the left parietal lobe was not definitely present on the prior studies and others have slightly increased/conspicuity in size since 10/18/2021. While some of the differences in conspicuity may be related to contrast timing/technique, the largest lesions in the left frontal lobe are noticeably increased in size. Electronically Signed   By: PValetta MoleM.D.   On: 04/12/2022 11:59    [facility] Clinician Interpretation: I have personally reviewed the radiological images as listed.  My interpretation, in the context of the patient's clinical presentation, is progressive disease   Assessment/Plan Malignant neoplasm metastatic to brain ([facility]  Nathan Righterpresents with clinical and radiographic syndrome consistent with progressive burden of metastatic neoplasm secondary to EGFR mutant lung adenocarcinoma.    Metastases were initially uncovered 1 year prior, but had been controlled with Tagrisso alone.  Overall tumor burden/volume remains small.  We discussed treatment options, including radiosurgery, whole brain radiation, and clinical trial which would randomize to [name] hippocampal sparing WBRT.  His preference would be for radiosurgery either on or off study.  No need for steroids or AEDs at this time.  We spent twenty additional minutes teaching regarding the natural history, biology, and historical experience in the treatment of neurologic complications of cancer.   We appreciate the opportunity to participate in  the care of [name]  [facility]  All questions were answered. The patient knows to call the clinic with any problems, questions or concerns. No barriers to learning were detected.  The total time spent in the encounter was 45 minutes and more than 50% was on counseling and review of test results   ZVentura Sellers MD Medical Director of Neuro-Oncology [name]at [facility]07/20/23 2:22 PM

## 2022-05-03 NOTE — Progress Notes (Signed)
Histology and Location of Primary Cancer:  Left lower lobe adenocarcinoma of the lung;  EGFR-Del19 (+)  Location(s) of Symptomatic tumor(s):  MRI Brain w/ & w/o Contrast 04/12/2022 --IMPRESSION: Numerous metastatic lesions in the supratentorial brain as above, one of which in the left parietal lobe was not definitely present on the prior studies and others have slightly increased/conspicuity in size since 10/18/2021. While some of the differences in conspicuity may be related to contrast timing/technique, the largest lesions in the left frontal lobe are noticeably increased in size.  Past/Anticipated chemotherapy by medical oncology, if any:  05/03/2022 --Dr. Cecil Cobbs Metastases were initially uncovered 1 year prior, but had been controlled with Tagrisso alone.   Overall tumor burden/volume remains small. We discussed treatment options, including radiosurgery, whole brain radiation, and clinical trial which would randomize to [facility] or hippocampal sparing WBRT.   His preference would be for radiosurgery either on or off study. No need for steroids or AEDs at this time. We can follow up with him in 3 months following RT at [facility]  04/30/2022 --Dr. Charlaine Dalton Currently on osimertinib 80 mg a day [since June 28th, 2022].  April 20th, 2023- PET scan Overall findings suggest a good response to treatment.  The large left lung mass has near completely resolved.  The omental and peritoneal carcinomatosis has resolved.  No residual hypermetabolic bone metastasis.  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   New small hypermetabolic axillary nodes bilaterally could be inflammatory but are suspicious for metastatic disease.  Would recommend imaging CT scan in next month.  Order imaging at next visit consider biopsy if continues to grow.  For now continue  osimertinib. Asymptomatic multiple brain mets subcentimeter asymptomatic [NO RT]-JUNE 29th, 2023 Numerous metastatic lesions in the supratentorial brain-increase in number also conspicuity compared to January 2023.   Discussed with Dr. Mickeal Skinner; currently awaiting appt with Dr.Vaslow- 7/20; rad-Onc on 7/21.  Bone metastases- CT DEc 7th, 2022 to healing process.  Dental evaluation on 9/21.   Continue calcium vitamin D intake. calcium 8.1; ?  Osimertinib.  HOLD zometa.  --Follow up in 4 weeks- MD; port Reva Bores- cbc/cmp; CK total- Dr.B  Pain on a scale of 0-10 is: Patient denies   Dose of Decadron, if applicable: Per Dr. Mickeal Skinner, not indicated at this time  Recent neurologic symptoms, if any:  Seizures: None Headaches: Denies Nausea: Denies Dizziness/ataxia: Denies Difficulty with hand coordination: None Focal numbness/weakness: Denies Visual deficits/changes: Denies Confusion/Memory deficits: Denies  Ambulatory status? Walker? Wheelchair?: Ambulatory without assistance   SAFETY ISSUES: Prior radiation? No Pacemaker/ICD? No Possible current pregnancy? N/A Is the patient on methotrexate? No  Additional Complaints / other details:  Nothing else of note

## 2022-05-04 ENCOUNTER — Ambulatory Visit
Admission: RE | Admit: 2022-05-04 | Discharge: 2022-05-04 | Disposition: A | Discharge status: Home / Self Care | Payer: Managed Care, Other (non HMO) | Source: Ambulatory Visit | Attending: Radiation Oncology | Admitting: Radiation Oncology

## 2022-05-04 ENCOUNTER — Encounter: Payer: Self-pay | Admitting: Radiation Oncology

## 2022-05-04 VITALS — BP 136/92 | HR 78 | Temp 98.1°F | Resp 18 | Wt 233.0 lb

## 2022-05-04 DIAGNOSIS — C7931 Secondary malignant neoplasm of brain: Secondary | ICD-10-CM

## 2022-05-04 DIAGNOSIS — C3432 Malignant neoplasm of lower lobe, left bronchus or lung: Secondary | ICD-10-CM | POA: Insufficient documentation

## 2022-05-04 DIAGNOSIS — Z79899 Other long term (current) drug therapy: Secondary | ICD-10-CM | POA: Diagnosis not present

## 2022-05-04 DIAGNOSIS — Z7901 Long term (current) use of anticoagulants: Secondary | ICD-10-CM | POA: Diagnosis not present

## 2022-05-04 DIAGNOSIS — Z801 Family history of malignant neoplasm of trachea, bronchus and lung: Secondary | ICD-10-CM | POA: Insufficient documentation

## 2022-05-04 DIAGNOSIS — J90 Pleural effusion, not elsewhere classified: Secondary | ICD-10-CM | POA: Insufficient documentation

## 2022-05-04 DIAGNOSIS — C7951 Secondary malignant neoplasm of bone: Secondary | ICD-10-CM | POA: Diagnosis not present

## 2022-05-08 ENCOUNTER — Other Ambulatory Visit: Payer: Managed Care, Other (non HMO)

## 2022-05-10 ENCOUNTER — Inpatient Hospital Stay: Payer: Managed Care, Other (non HMO)

## 2022-05-10 NOTE — Progress Notes (Signed)
National Work  Clinical Social Work contacted patient to assess psychosocial needs.  Clinical Social Worker contacted patient by phone  to offer support and assess for needs.  Discussed resources and support groups.  Patient continues to work.  He expressed no needs at this time, but will contact CSW should circumstances change.  Margaree Mackintosh, LCSW  Clinical Social Worker [facility]

## 2022-05-11 ENCOUNTER — Ambulatory Visit: Payer: Managed Care, Other (non HMO)

## 2022-05-11 ENCOUNTER — Ambulatory Visit: Payer: Managed Care, Other (non HMO) | Admitting: Radiation Oncology

## 2022-05-14 ENCOUNTER — Other Ambulatory Visit: Payer: Self-pay | Admitting: Internal Medicine

## 2022-05-15 ENCOUNTER — Other Ambulatory Visit: Payer: Self-pay | Admitting: Oncology

## 2022-05-15 ENCOUNTER — Encounter: Payer: Self-pay | Admitting: Internal Medicine

## 2022-05-15 ENCOUNTER — Encounter: Payer: Self-pay | Admitting: Nurse Practitioner

## 2022-05-15 ENCOUNTER — Other Ambulatory Visit: Payer: Self-pay

## 2022-05-15 DIAGNOSIS — C3492 Malignant neoplasm of unspecified part of left bronchus or lung: Secondary | ICD-10-CM

## 2022-05-15 MED ORDER — APIXABAN 5 MG PO TABS: 5.0000 mg | ORAL_TABLET | Freq: Two times a day (BID) | ORAL | 2 refills | Status: DC

## 2022-05-15 MED ORDER — SERTRALINE HCL 50 MG PO TABS: 25.0000 mg | ORAL_TABLET | Freq: Every day | ORAL | 2 refills | Status: DC

## 2022-05-22 ENCOUNTER — Other Ambulatory Visit: Payer: Self-pay | Admitting: Oncology

## 2022-05-22 DIAGNOSIS — C3492 Malignant neoplasm of unspecified part of left bronchus or lung: Secondary | ICD-10-CM

## 2022-05-22 NOTE — Telephone Encounter (Signed)
CBC with Differential/Platelet Order: [id number] Status: Final result    Visible to patient: Yes (not seen)    Next appt: 05/29/2022 at 11:00 AM in Radiology ([facility])    Dx: Metastatic cancer to brain [facility]); Can...    0 Result Notes           Component Ref Range & Units 3 wk ago 1 mo ago 2 mo ago 3 mo ago 4 mo ago 6 mo ago 7 mo ago  WBC 4.0 - 10.5 K/uL 8.8  7.7  6.8  6.7  5.5  5.6  6.9   RBC 4.22 - 5.81 MIL/uL 5.07  5.02  4.95  5.20  4.94  4.88  4.88   Hemoglobin 13.0 - 17.0 g/dL 14.3  14.1  13.9  14.0  13.4  13.3  13.8   HCT 39.0 - 52.0 % 43.7  43.6  42.3  43.5  41.5  41.1  42.2   MCV 80.0 - 100.0 fL 86.2  86.9  85.5  83.7  84.0  84.2  86.5   MCH 26.0 - 34.0 pg 28.2  28.1  28.1  26.9  27.1  27.3  28.3   MCHC 30.0 - 36.0 g/dL 32.7  32.3  32.9  32.2  32.3  32.4  32.7   RDW 11.5 - 15.5 % 14.7  15.0  15.6 High   15.3  14.6  13.2  12.5   Platelets 150 - 400 K/uL 193  239  230  237  243  259  280   nRBC 0.0 - 0.2 % 0.0  0.0  0.0  0.0  0.0  0.0  0.0   Neutrophils Relative % % 74  73  69  71  62  66  67   Neutro Abs 1.7 - 7.7 K/uL 6.5  5.7  4.7  4.8  3.5  3.8  4.6   Lymphocytes Relative % 13  13  17  17  22  20  19    Lymphs Abs 0.7 - 4.0 K/uL 1.1  1.0  1.1  1.1  1.2  1.2  1.3   Monocytes Relative % 9  9  10  10  12  10  10    Monocytes Absolute 0.1 - 1.0 K/uL 0.8  0.7  0.7  0.7  0.7  0.6  0.7   Eosinophils Relative % 2  3  2  2  2  2  3    Eosinophils Absolute 0.0 - 0.5 K/uL 0.2  0.2  0.2  0.1  0.1  0.1  0.2   Basophils Relative % 1  1  1   0  1  1  1    Basophils Absolute 0.0 - 0.1 K/uL 0.1  0.0  0.0  0.0  0.0  0.0  0.0   Immature Granulocytes % 1  1  1   0  1  1  0   Abs Immature Granulocytes 0.00 - 0.07 K/uL 0.11 High   0.05 CM  0.04 CM  0.02 CM  0.03 CM  0.03 CM  0.03 CM   Comment: Performed at [facility], [address] [place], [place] [address]  Resulting Agency  [facility] CLIN LAB [place] [facility] LAB [place] CL[facility] LAB [place] [facility] [place] [facility] [place] [facility] CL[facility] LAB         Specimen  Collected: 04/30/22 15:11 Last Resulted: 04/30/22 15:25      Lab Flowsheet    Order Details  Engineer, civil (consulting) History    View All Conversations on this Encounter      CM=Additional comments      Result Care Coordination   Patient Communication   Add Comments   Add Notifications  Back to Top       Other Results from 04/30/2022  Aldolase Order: [id number] Status: Final result    Visible to patient: Yes (not seen)    Next appt: 05/29/2022 at 11:00 AM in Radiology ([facility] [place] [facility])    Dx: Myalgia; Cancer of lower lobe of left...    0 Result Notes     Component Ref Range & Units 3 wk ago  Aldolase 3.3 - 10.3 U/L 8.7   Comment: (NOTE)  Performed At: [facility]  [place], [place] [id number]  Rush Farmer MD IH:[id number]   Resulting Agency  [facility] CLIN LAB         Specimen Collected: 04/30/22 15:11 Last Resulted: 05/01/22 11:36      Lab Flowsheet    Order Details    View Encounter    Lab and Collection Details    Routing    Result History    View All Conversations on this Encounter        Result Care Coordination   Patient Communication   Add Comments   Add Notifications  Back to Top         CK Order: [id number] Status: Final result    Visible to patient: Yes (not seen)    Next appt: 05/29/2022 at 11:00 AM in Radiology ([facility] [place] [facility])    Dx: Myalgia; Cancer of lower lobe of left...    0 Result Notes      Component Ref Range & Units 3 wk ago 2 mo ago  Total CK 49 - 397 U/L 294  420 High  CM   Comment: Performed at [facility], [address] [place], [place] [address]  Resulting Agency  [facility] CLIN LAB [facility] CLIN LAB         Specimen Collected: 04/30/22 15:11 Last Resulted: 04/30/22 16:17      Lab Flowsheet    Order Details    View Encounter    Lab and Collection Details    Routing    Result History    View All Conversations on this Encounter       CM=Additional comments      Result Care Coordination   Patient Communication   Add Comments   Add Notifications  Back to Top          Contains abnormal data Comprehensive metabolic panel Order: [id number] Status: Final result    Visible to patient: Yes (not seen)    Next appt: 05/29/2022 at 11:00 AM in Radiology ([facility] [place] [facility])    Dx: Metastatic cancer to brain ([place]); Can...    0 Result Notes           Component Ref Range & Units 3 wk ago 1 mo ago 2 mo ago 3 mo ago 4 mo ago 6 mo ago 7 mo ago  Sodium 135 - 145 mmol/L 133 Low   137  138  135  133 Low   133 Low   135   Potassium 3.5 - 5.1 mmol/L 4.1  4.3  3.8  3.9  3.7  3.5  3.6   Chloride 98 - 111 mmol/L 106  106  106  108  103  105  105   CO2 22 - 32 mmol/L 19 Low   26  24  21  Low   25  24  25    Glucose, Bld 70 - 99 mg/dL 86  92 CM  133 High  CM  108 High  CM  118 High  CM  125 High  CM  108 High  CM   Comment: Glucose reference range applies only to samples taken after fasting for at least 8 hours.  BUN 6 - 20 mg/dL 11  9  11  16  13  11  10    Creatinine, Ser 0.61 - 1.24 mg/dL 1.33 High   1.11  1.26 High   1.17  1.13  1.16  1.24   Calcium 8.9 - 10.3 mg/dL 8.6 Low   8.5 Low   8.1 Low   8.2 Low   8.1 Low   7.9 Low   8.3 Low    Total Protein 6.5 - 8.1 g/dL 7.3   7.5  7.2  7.1  7.1  7.5   Albumin 3.5 - 5.0 g/dL 3.8   3.7  3.6  3.3 Low   3.3 Low   3.4 Low    AST 15 - 41 U/L 39   46 High   35  34  33  29   ALT 0 - 44 U/L 44   47 High   42  34  32  34   Alkaline Phosphatase 38 - 126 U/L 106   123  132 High   143 High   138 High   141 High    Total Bilirubin 0.3 - 1.2 mg/dL 0.3   0.3  0.4  0.2 Low   0.3  0.4   GFR, Estimated >60 mL/min >60  >60 CM  >60 CM  >60 CM  >60 CM  >60 CM  >60 CM   Comment: (NOTE)  Calculated using the CKD-EPI Creatinine Equation (2021)   Anion gap 5 - 15 8  5  CM  8 CM  6 CM  5 CM  4 Low  CM  5 CM   Comment: Performed at [facility], [address] [place], [place] [address]  Resulting Agency   [facility] CLIN LAB [place] CLIN LAB [place] CLIN LAB [place] CL[facility] LAB [place] CLIN LAB [place] CLIN LAB [place] CLIN LAB         Specimen Collected: 04/30/22 15:11 Last Resulted: 04/30/22 15:39

## 2022-05-29 ENCOUNTER — Ambulatory Visit: Payer: Managed Care, Other (non HMO)

## 2022-05-29 ENCOUNTER — Ambulatory Visit: Payer: Managed Care, Other (non HMO) | Admitting: Radiation Oncology

## 2022-05-29 ENCOUNTER — Ambulatory Visit
Admission: RE | Admit: 2022-05-29 | Discharge: 2022-05-29 | Disposition: A | Discharge status: Home / Self Care | Payer: Managed Care, Other (non HMO) | Source: Ambulatory Visit | Attending: Internal Medicine | Admitting: Internal Medicine

## 2022-05-29 DIAGNOSIS — C7931 Secondary malignant neoplasm of brain: Secondary | ICD-10-CM

## 2022-05-29 MED ORDER — GADOBENATE DIMEGLUMINE 529 MG/ML IV SOLN
20.0000 mL | Freq: Once | INTRAVENOUS | Status: AC | PRN
  Administered 2022-05-29: 20 mL via INTRAVENOUS

## 2022-05-30 ENCOUNTER — Ambulatory Visit
Admission: RE | Admit: 2022-05-30 | Discharge: 2022-05-30 | Disposition: A | Discharge status: Home / Self Care | Payer: Managed Care, Other (non HMO) | Source: Ambulatory Visit | Attending: Radiation Oncology | Admitting: Radiation Oncology

## 2022-05-30 ENCOUNTER — Ambulatory Visit
Admission: RE | Admit: 2022-05-30 | Discharge: 2022-05-30 | Disposition: A | Discharge status: Home / Self Care | Payer: Managed Care, Other (non HMO) | Source: Ambulatory Visit | Attending: Radiation Oncology

## 2022-05-30 ENCOUNTER — Other Ambulatory Visit: Payer: Self-pay

## 2022-05-30 VITALS — BP 127/82 | HR 72 | Temp 98.1°F | Resp 18 | Ht 70.0 in | Wt 239.1 lb

## 2022-05-30 DIAGNOSIS — C7931 Secondary malignant neoplasm of brain: Secondary | ICD-10-CM | POA: Insufficient documentation

## 2022-05-30 MED ORDER — SODIUM CHLORIDE 0.9% FLUSH
10.0000 mL | Freq: Once | INTRAVENOUS | Status: AC
  Administered 2022-05-30: 10 mL via INTRAVENOUS

## 2022-05-30 MED ORDER — HEPARIN SOD (PORK) LOCK FLUSH 100 UNIT/ML IV SOLN
500.0000 [IU] | Freq: Once | INTRAVENOUS | Status: AC
  Administered 2022-05-30: 500 [IU] via INTRAVENOUS

## 2022-05-30 NOTE — Progress Notes (Signed)
Has armband been applied?  Yes.    Does patient have an allergy to IV contrast dye?: No.   Has patient ever received premedication for IV contrast dye?: No.   Date of lab work: April 30, 2022 BUN: 11 CR: 1.33 eGFR: >60  Does patient take metformin?: No.  IV site: subclavian right, condition patent and no redness  Has IV site been added to flowsheet?  Yes.    BP 127/82 (BP Location: Left Arm, Patient Position: Sitting)   Pulse 72   Temp 98.1 F (36.7 C) (Oral)   Resp 18   Ht '5\' 10"'  (1.778 m)   Wt 239 lb 2 oz (108.5 kg)   SpO2 97%   BMI 34.31 kg/m

## 2022-05-30 NOTE — Research (Cosign Needed)
DCP-001: Use of a Clinical Trial Screening Tool to Address Cancer Health Disparities in the [place] [facility] [place])  Patient [name] was identified by this research nurse as a potential candidate for the above listed study. This Clinical Research Nurse met with ELIAM SNAPP, [id number], on 05/30/22 in a manner and location that ensures patient privacy to discuss participation in the above listed research study. Patient is Accompanied by his wife . A copy of the informed consent document and separate HIPAA Authorization was provided to the patient. Patient reads, speaks, and understands [place].    Patient was provided with the business card of this Nurse and encouraged to contact the research team with any questions. Patient was provided the option of taking informed consent documents home to review and was encouraged to review at their convenience with their support network, including other care providers. Patient is comfortable with making a decision regarding study participation today.  As outlined in the informed consent form, this Nurse and Staci Righter discussed the purpose of the research study, the investigational nature of the study, study procedures and requirements for study participation, potential risks and benefits of study participation, as well as alternatives to participation. This study is not blinded. The patient understands participation is voluntary and they may withdraw from study participation at any time.  This study does not involve randomization.  This study does not involve an investigational drug or device. This study does not involve a placebo. Patient understands enrollment is pending full eligibility review.   Confidentiality and how the patient's information will be used as part of study participation were discussed. Patient was informed there is not reimbursement provided for their time and effort spent on trial participation. The  patient is encouraged to discuss research study participation with their insurance provider to determine what costs they may incur as part of study participation, including research related injury.    All questions were answered to patient's satisfaction. The informed consent and separate HIPAA Authorization was reviewed page by page. The patient's mental and emotional status is appropriate to provide informed consent, and the patient verbalizes an understanding of study participation. Patient has agreed to participate in the above listed research study and has voluntarily signed the informed consent ([place] Date 02/13/22) and separate HIPAA Authorization, version 15  on 05/30/22 at 930AM. The patient was provided with a copy of the signed informed consent form and separate HIPAA Authorization for their reference. No study specific procedures were obtained prior to the signing of the informed consent document. Approximately 20 minutes were spent with the patient reviewing the informed consent documents. Patient was not requested to complete a Release of Information form.  Data collected after consent obtained.  Vickii Penna, RN, BSN, CPN Clinical Research Nurse I [phone]  05/30/2022 9:49 AM

## 2022-06-03 NOTE — Assessment & Plan Note (Signed)
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 Currently on osimertinib 80 mg a day [osimertinib [since June 28th, 2022]. April 20th, 2023- PET scan- Overall findings suggest a good response to treatment. The large left lung mass has near completely resolved. The omental and peritoneal carcinomatosis has resolved. No residual hypermetabolic bone metastasis. 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    #PET scan April 2023- new small hypermetabolic axillary nodes bilaterally could be inflammatory but are suspicious for metastatic disease. Would recommend imaging CT scan in next month/ordered today.  Would consider order/biopsy if continues to grow.  For now continue osimertinib.  #Asymptomatic multiple brain mets subcentimeter asymptomatic [NO RT]-JUNE 29th, 2023- Numerous metastatic lesions in the supratentorial brain-increase in number also conspicuity compared to January 2023.  Pre-SBRT MRI brain AUG 18th, 2023-  Eighteen small enhancing brain metastases (punctate to 11 mm); were all present on 03/28/2021 and have regressed since that time.   #Left-sided pleural effusion-question reactive [July 2022]-vs malignancy; follows up with Dr. Raul Del; monitor for now.  Stable  #Skin rash on the torso-likely secondary to osimertinib.  Improved with Kenalog ointment- STABLE.  #Left lung PE [incidental on CT SEP 13th,2022-]?Symptomatic DVT of the right calf-on Eliquis-STABLE.  #Bone metastases- CT DEc 7th, 2022 to healing process.  Dental evaluation on 9/21.  Continue calcium vitamin D intake. calcium 8.1; ?  Osimertinib. HOLD zometa.   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  Will refill- STABLE.  # Continued hypocalcemia- ?  Symptomatic cramps.  Vitamin D levels normal.  CK420-June; July 2023  with repeat CK-within normal limits.  Monitor for now  # IV access/poor access-: port flush.   # DISPOSITION: # follow up in 6  weeks- MD; port Reva Bores- cbc/cmp; CT chest prior- Dr.B

## 2022-06-03 NOTE — Progress Notes (Unsigned)
[facility] NOTE  Patient Care Team: Pcp, No as PCP - General [name], RN as Oncology Nurse Navigator [name], MD as Consulting Physician (Internal Medicine)  CHIEF COMPLAINTS/PURPOSE OF CONSULTATION: Lung cancer  #  Oncology History Overview Note  IMPRESSION: 1. Patchy nodular fat stranding throughout the anterior left upper quadrant peritoneal fat, nonspecific, cannot exclude peritoneal carcinomatosis. Dedicated CT abdomen/pelvis with oral and IV contrast recommended for further evaluation. 2. Dense patchy consolidation replacing much of the left lower lung lobe, appearing masslike in the superior segment left lower lobe, with associated bulging of the left major fissure and associated left lower lobe volume loss. Fine nodularity throughout both lungs with an upper lobe predominance. Asymmetric left upper lobe interlobular septal thickening. These findings are indeterminate, with differential including multilobar pneumonia, sarcoidosis or a neoplastic process. The persistence on radiographs back to 01/20/2021 despite antibiotic therapy make sarcoidosis or a neoplastic process more likely. Pulmonology consultation suggested for consideration of bronchoscopic evaluation. 3. Small dependent left pleural effusion. 4. Mild mediastinal lymphadenopathy, nonspecific. 5. Subacute healing lateral right sixth rib fracture.   DIAGNOSIS:  A. LUNG, LEFT LOWER LOBE; ENB-ASSISTED BIOPSY:  - NON-SMALL CELL CARCINOMA, FAVOR ADENOCARCINOMA.  - FOREIGN MATERIAL SUGGESTIVE OF ASPIRATION.   # EGFR MUTATED: 36 del- June 28th, 2022- [name]. [limited]   Cancer of lower lobe of left lung ([place])  03/23/2021 Initial Diagnosis   Cancer of lower lobe of left lung ([place])   03/29/2021 Cancer Staging   Staging form: Lung, AJCC 8th Edition - Clinical: Stage IVB (cT3, cN3, pM1c) - Signed by [name], MD on 03/29/2021   03/30/2021 - 03/30/2021  Chemotherapy   Patient is on Treatment Plan : LUNG NSCLC Pemetrexed ([place]) / Carboplatin q21d x 1 cycles        HISTORY OF PRESENTING ILLNESS: Ambulating independently.  Alone.  [name] 41 y.o.  male patient with stage IV lung cancer adenocarcinoma-brain mets [synchronous] bone mets EGFR mutated on osimertinib is here for follow-up/review results of the MRI.  In the interim patient was evaluated by radiation oncology and also neurooncology in [place]-given his progressive asymptomatic brain metastasis.  Patient is currently awaiting SBRT of his brain lesions.  He continues to be on Osimertinib.  He admits to compliance.  Denies any worsening pain. Patient continues to have intermittent groin pain especially with ambulation.  Otherwise no dyspnea no nausea no vomiting.  No worsening skin rash.   Review of Systems  Constitutional:  Positive for malaise/fatigue. Negative for chills, diaphoresis and fever.  HENT:  Negative for nosebleeds and sore throat.   Eyes:  Negative for double vision.  Respiratory:  Negative for wheezing.   Cardiovascular:  Negative for chest pain, palpitations, orthopnea and leg swelling.  Gastrointestinal:  Negative for abdominal pain, blood in stool, diarrhea, heartburn, melena, nausea and vomiting.  Genitourinary:  Negative for dysuria, frequency and urgency.  Musculoskeletal:  Negative for back pain and joint pain.  Skin:  Negative for itching.  Neurological:  Positive for weakness. Negative for dizziness, tingling, focal weakness and headaches.  Endo/Heme/Allergies:  Does not bruise/bleed easily.  Psychiatric/Behavioral:  Negative for depression. The patient is not nervous/anxious and does not have insomnia.      MEDICAL HISTORY:  Past Medical History:  Diagnosis Date   Cancer ([place])    Dyspnea    Heartburn    Pneumonia    Wolff-Parkinson-White (WPW) 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    SURGICAL HISTORY: Past  Surgical History:  Procedure  Laterality Date   FRACTURE SURGERY     broke femur when he was 6 years   PORTA CATH INSERTION N/A 03/28/2021   Procedure: PORTA CATH INSERTION;  Surgeon: Katha Cabal, MD;  Location: [facility];  Service: Cardiovascular;  Laterality: N/A;   VIDEO BRONCHOSCOPY WITH ENDOBRONCHIAL NAVIGATION N/A 03/15/2021   Procedure: VIDEO BRONCHOSCOPY WITH ENDOBRONCHIAL NAVIGATION;  Surgeon: Ottie Glazier, MD;  Location: [facility];  Service: Thoracic;  Laterality: N/A;   VIDEO BRONCHOSCOPY WITH ENDOBRONCHIAL ULTRASOUND N/A 03/15/2021   Procedure: VIDEO BRONCHOSCOPY WITH ENDOBRONCHIAL ULTRASOUND;  Surgeon: Ottie Glazier, MD;  Location: [facility];  Service: Thoracic;  Laterality: N/A;    SOCIAL HISTORY: Social History   Socioeconomic History   Marital status: Married    Spouse name: [name]   Number of children: 2   Years of education: Not on file   Highest education level: Not on file  Occupational History   Not on file  Tobacco Use   Smoking status: Never   Smokeless tobacco: Current    Types: Snuff, Chew  Vaping Use   Vaping Use: Never used  Substance and Sexual Activity   Alcohol use: Never   Drug use: Never   Sexual activity: Not on file  Other Topics Concern   Not on file  Social History Narrative   [place]; with wife; 2 daughters[12 and 22]; never smoked; rare alcohol. Work in saw [employer]    Social Determinants of Health   Financial Resource Strain: Low Risk  (05/10/2022)   Overall Financial Resource Strain (CARDIA)    Difficulty of Paying Living Expenses: Not hard at all  Food Insecurity: No Food Insecurity (05/10/2022)   Hunger Vital Sign    Worried About Running Out of Food in the Last Year: Never true    Ran Out of Food in the Last Year: Never true  Transportation Needs: No Transportation Needs (03/16/2022)   PRAPARE - Hydrologist (Medical): No    Lack of Transportation (Non-Medical): No  Physical Activity: Not on file  Stress:  Not on file  Social Connections: Socially Integrated (05/10/2022)   Social Connection and Isolation Panel [NHANES]    Frequency of Communication with Friends and Family: More than three times a week    Frequency of Social Gatherings with Friends and Family: More than three times a week    Attends Religious Services: More than 4 times per year    Active Member of [employer] or Organizations: Yes    Attends Music therapist: More than 4 times per year    Marital Status: Married  Human resources officer Violence: Not on file    FAMILY HISTORY: Family History  Problem Relation Age of Onset   High Cholesterol Mother    Hypertension Father    Cancer Father    Lung cancer Father    Skin cancer Father     ALLERGIES:  is allergic to codeine.  MEDICATIONS:  Current Outpatient Medications  Medication Sig Dispense Refill   apixaban (ELIQUIS) 5 MG TABS tablet Take 1 tablet (5 mg total) by mouth 2 (two) times daily. 60 tablet 2   folic acid (FOLVITE) 1 MG tablet Take 1 tablet (1 mg total) by mouth daily. 90 tablet 1   lidocaine-prilocaine (EMLA) cream Apply 1 application topically as needed. 30 g 0   ondansetron (ZOFRAN) 8 MG tablet One pill every 8 hours as needed for nausea/vomitting. 40 tablet 1  prochlorperazine (COMPAZINE) 10 MG tablet Take 1 tablet (10 mg total) by mouth every 6 (six) hours as needed for nausea or vomiting. 40 tablet 1   sertraline (ZOLOFT) 50 MG tablet TAKE 1/2 TABLET BY MOUTH DAILY 30 tablet 2   TAGRISSO 80 MG tablet TAKE 1 TABLET DAILY 30 tablet 4   metoprolol succinate (TOPROL XL) 25 MG 24 hr tablet Take 1 tablet (25 mg total) by mouth daily. (Patient taking differently: Take 50 mg by mouth daily.) 30 tablet 1   No current facility-administered medications for this visit.   Facility-Administered Medications Ordered in Other Visits  Medication Dose Route Frequency Provider Last Rate Last Admin   heparin lock flush 100 UNIT/ML injection            heparin lock  flush 100 UNIT/ML injection            sodium chloride flush (NS) 0.9 % injection 10 mL  10 mL Intravenous PRN [name], MD   10 mL at 07/28/21 0852      .  PHYSICAL EXAMINATION: ECOG PERFORMANCE STATUS: 1 - Symptomatic but completely ambulatory  Vitals:   06/04/22 1513  BP: 123/79  Pulse: 72  Temp: 98.7 F (37.1 C)  SpO2: 98%    Filed Weights   06/04/22 1513  Weight: 238 lb 12.8 oz (108.3 kg)      Physical Exam HENT:     Head: Normocephalic and atraumatic.     Mouth/Throat:     Pharynx: No oropharyngeal exudate.  Eyes:     Pupils: Pupils are equal, round, and reactive to light.  Cardiovascular:     Rate and Rhythm: Normal rate and regular rhythm.  Pulmonary:     Effort: No respiratory distress.     Breath sounds: No wheezing.     Comments: Decreased breath sound on the left side compared to right. Abdominal:     General: Bowel sounds are normal. There is no distension.     Palpations: Abdomen is soft. There is no mass.     Tenderness: There is no abdominal tenderness. There is no guarding or rebound.  Musculoskeletal:        General: No tenderness. Normal range of motion.     Cervical back: Normal range of motion and neck supple.  Skin:    General: Skin is warm.  Neurological:     Mental Status: He is alert and oriented to person, place, and time.  Psychiatric:        Mood and Affect: Affect normal.    Fungal dermatitis bilateral buttocks groin/underneath abdominal pannus  LABORATORY DATA:  I have reviewed the data as listed Lab Results  Component Value Date   WBC 8.3 06/04/2022   HGB 13.8 06/04/2022   HCT 41.9 06/04/2022   MCV 87.3 06/04/2022   PLT 246 06/04/2022   Recent Labs    03/16/22 1527 04/18/22 1312 04/30/22 1511 06/04/22 1501  NA 138 137 133* 136  K 3.8 4.3 4.1 3.7  CL 106 106 106 107  CO2 24 26 19* 24  GLUCOSE 133* 92 86 86  BUN '11 9 11 13  ' CREATININE 1.26* 1.11 1.33* 1.10  CALCIUM 8.1* 8.5* 8.6* 8.2*  GFRNONAA  >60 >60 >60 >60  PROT 7.5  --  7.3 7.3  ALBUMIN 3.7  --  3.8 3.7  AST 46*  --  39 36  ALT 47*  --  44 52*  ALKPHOS 123  --  106 123  BILITOT 0.3  --  0.3 0.5    RADIOGRAPHIC STUDIES: I have personally reviewed the radiological images as listed and agreed with the findings in the report. MR Brain W Wo Contrast  Result Date: 05/31/2022 CLINICAL DATA:  41 year old male with metastatic non-small cell lung cancer. Numerous brain metastases diagnosed in June 2022, with regression following systemic treatment, but later mixed response and ultimately progression on follow-up. Planned SRS. EXAM: MRI HEAD WITHOUT AND WITH CONTRAST TECHNIQUE: Multiplanar, multiecho pulse sequences of the brain and surrounding structures were obtained without and with intravenous contrast. CONTRAST:  83m MULTIHANCE GADOBENATE DIMEGLUMINE 529 MG/ML IV SOLN COMPARISON:  04/12/2022 and earlier. FINDINGS: BRAIN Black blood postcontrast MRI technique today series 13, but some vascular artifact on that sequence. Postcontrast MPRAGE (series 14) also performed). New Lesions: No definite; 2 punctate areas of enhancement which were not present on the 03/28/2021 initial staging exam on series 13, image 119 (right parietal lobe) and series 13, image 70 (right occipital lobe) are not definitely correlated on in PE rage today and favored to be vascular artifact. Larger lesions: No definite. Stable or Smaller lesions: Approximally 18 small enhancing metastases are generally decreased compared to the initial brain MRI 03/28/2021, all were present on that exam. These range from punctate to 11 mm (left middle frontal gyrus series 13, image 121). These are annotated with single arrows on series 13. No dural thickening or leptomeningeal enhancement identified. Other Brain findings: No restricted diffusion suggestive of acute infarction. No midline shift, significant mass effect, ventriculomegaly, extra-axial collection or acute intracranial  hemorrhage. Cervicomedullary junction and pituitary are within normal limits. No vasogenic edema. Hemosiderin associated with some of the chronic metastases. Deep gray nuclei and brainstem signal remains normal. Vascular: Major intracranial vascular flow voids are stable. Dominant appearing right and diminutive or absent distal left vertebral arteries. Skull and upper cervical spine: Stable bone marrow signal. No destructive osseous lesion identified. Negative visible cervical spinal cord. Sinuses/Orbits: Stable, negative. Other: Mastoids remain clear. Visible internal auditory structures appear normal. Negative visible scalp and face. IMPRESSION: 1. Eighteen small enhancing brain metastases (punctate to 11 mm) were all present on 03/28/2021 and have regressed since that time. 2. No definite brand new or progressed metastatic disease; assuming punctate vascular artifact in the right occipital lobe on series 13, image 70 and in the right parietal lobe image 19. 3. No cerebral edema or mass effect. Electronically Signed   By: HGenevie AnnM.D.   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 Currently on osimertinib 80 mg a day [osimertinib [since June 28th, 2022]. April 20th, 2023- PET scan- Overall findings suggest a good response to treatment. The large left lung mass has near completely resolved. The omental and peritoneal carcinomatosis has resolved. No residual hypermetabolic bone metastasis. 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    #PET scan April 2023- new small hypermetabolic axillary nodes bilaterally could be inflammatory but are suspicious for metastatic disease. Would recommend imaging CT scan in next month/ordered today.  Would consider order/biopsy if  continues to grow.  For now continue osimertinib.  #Asymptomatic multiple brain mets subcentimeter asymptomatic [NO RT]-JUNE 29th, 2023- Numerous metastatic lesions in the supratentorial brain-increase in number also conspicuity compared to January 2023.  Pre-SBRT MRI brain AUG 18th, 2023-  Eighteen small enhancing  brain metastases (punctate to 11 mm); were all present on 03/28/2021 and have regressed since that time.   #Left-sided pleural effusion-question reactive [July 2022]-vs malignancy; follows up with Dr. Raul Del; monitor for now.  Stable  #Skin rash on the torso-likely secondary to osimertinib.  Improved with Kenalog ointment- STABLE.  #Left lung PE [incidental on CT SEP 13th,2022-]?Symptomatic DVT of the right calf-on Eliquis-STABLE.  #Bone metastases- CT DEc 7th, 2022 to healing process.  Dental evaluation on 9/21.  Continue calcium vitamin D intake. calcium 8.1; ?  Osimertinib. HOLD zometa.   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  Will refill- STABLE.  # Continued hypocalcemia- ?  Symptomatic cramps.  Vitamin D levels normal.  CK420-June; July 2023 with repeat CK-within normal limits.  Monitor for now  # IV access/poor access-: port flush.   # DISPOSITION: # follow up in 6  weeks- MD; port Reva Bores- cbc/cmp; CT chest prior- Dr.B                 All questions were answered. The patient knows to call the clinic with any problems, questions or concerns.    [name], MD 06/05/2022 7:06 PM

## 2022-06-04 ENCOUNTER — Ambulatory Visit: Payer: Managed Care, Other (non HMO)

## 2022-06-04 ENCOUNTER — Inpatient Hospital Stay: Payer: Managed Care, Other (non HMO) | Attending: Internal Medicine | Admitting: Internal Medicine

## 2022-06-04 ENCOUNTER — Encounter: Payer: Self-pay | Admitting: Internal Medicine

## 2022-06-04 ENCOUNTER — Inpatient Hospital Stay: Payer: Managed Care, Other (non HMO)

## 2022-06-04 DIAGNOSIS — Z452 Encounter for adjustment and management of vascular access device: Secondary | ICD-10-CM | POA: Diagnosis not present

## 2022-06-04 DIAGNOSIS — J90 Pleural effusion, not elsewhere classified: Secondary | ICD-10-CM | POA: Insufficient documentation

## 2022-06-04 DIAGNOSIS — C3432 Malignant neoplasm of lower lobe, left bronchus or lung: Secondary | ICD-10-CM

## 2022-06-04 DIAGNOSIS — Z86711 Personal history of pulmonary embolism: Secondary | ICD-10-CM | POA: Diagnosis not present

## 2022-06-04 DIAGNOSIS — Z7901 Long term (current) use of anticoagulants: Secondary | ICD-10-CM | POA: Diagnosis not present

## 2022-06-04 DIAGNOSIS — C7951 Secondary malignant neoplasm of bone: Secondary | ICD-10-CM | POA: Insufficient documentation

## 2022-06-04 DIAGNOSIS — C7931 Secondary malignant neoplasm of brain: Secondary | ICD-10-CM | POA: Insufficient documentation

## 2022-06-04 LAB — CBC WITH DIFFERENTIAL/PLATELET
Abs Immature Granulocytes: 0.1 10*3/uL — ABNORMAL HIGH (ref 0.00–0.07)
Basophils Absolute: 0.1 10*3/uL (ref 0.0–0.1)
Basophils Relative: 1 %
Eosinophils Absolute: 0.2 10*3/uL (ref 0.0–0.5)
Eosinophils Relative: 3 %
HCT: 41.9 % (ref 39.0–52.0)
Hemoglobin: 13.8 g/dL (ref 13.0–17.0)
Immature Granulocytes: 1 %
Lymphocytes Relative: 15 %
Lymphs Abs: 1.2 10*3/uL (ref 0.7–4.0)
MCH: 28.8 pg (ref 26.0–34.0)
MCHC: 32.9 g/dL (ref 30.0–36.0)
MCV: 87.3 fL (ref 80.0–100.0)
Monocytes Absolute: 0.8 10*3/uL (ref 0.1–1.0)
Monocytes Relative: 10 %
Neutro Abs: 5.9 10*3/uL (ref 1.7–7.7)
Neutrophils Relative %: 70 %
Platelets: 246 10*3/uL (ref 150–400)
RBC: 4.8 MIL/uL (ref 4.22–5.81)
RDW: 14.9 % (ref 11.5–15.5)
WBC: 8.3 10*3/uL (ref 4.0–10.5)
nRBC: 0 % (ref 0.0–0.2)

## 2022-06-04 LAB — COMPREHENSIVE METABOLIC PANEL
ALT: 52 U/L — ABNORMAL HIGH (ref 0–44)
AST: 36 U/L (ref 15–41)
Albumin: 3.7 g/dL (ref 3.5–5.0)
Alkaline Phosphatase: 123 U/L (ref 38–126)
Anion gap: 5 (ref 5–15)
BUN: 13 mg/dL (ref 6–20)
CO2: 24 mmol/L (ref 22–32)
Calcium: 8.2 mg/dL — ABNORMAL LOW (ref 8.9–10.3)
Chloride: 107 mmol/L (ref 98–111)
Creatinine, Ser: 1.1 mg/dL (ref 0.61–1.24)
GFR, Estimated: >60 mL/min (ref >60)
Glucose, Bld: 86 mg/dL (ref 70–99)
Potassium: 3.7 mmol/L (ref 3.5–5.1)
Sodium: 136 mmol/L (ref 135–145)
Total Bilirubin: 0.5 mg/dL (ref 0.3–1.2)
Total Protein: 7.3 g/dL (ref 6.5–8.1)

## 2022-06-04 LAB — CK: Total CK: 160 U/L (ref 49–397)

## 2022-06-04 MED ORDER — SODIUM CHLORIDE 0.9% FLUSH
10.0000 mL | Freq: Once | INTRAVENOUS | Status: AC
  Administered 2022-06-04: 10 mL via INTRAVENOUS
  Filled 2022-06-04: qty 10

## 2022-06-04 MED ORDER — HEPARIN SOD (PORK) LOCK FLUSH 100 UNIT/ML IV SOLN
500.0000 [IU] | Freq: Once | INTRAVENOUS | Status: AC
  Administered 2022-06-04: 500 [IU] via INTRAVENOUS
  Filled 2022-06-04: qty 5

## 2022-06-05 ENCOUNTER — Encounter: Payer: Self-pay | Admitting: Nurse Practitioner

## 2022-06-05 ENCOUNTER — Other Ambulatory Visit: Payer: Self-pay

## 2022-06-05 ENCOUNTER — Encounter: Payer: Self-pay | Admitting: Radiation Oncology

## 2022-06-05 ENCOUNTER — Ambulatory Visit
Admission: RE | Admit: 2022-06-05 | Discharge: 2022-06-05 | Disposition: A | Discharge status: Home / Self Care | Payer: Managed Care, Other (non HMO) | Source: Ambulatory Visit | Attending: Radiation Oncology | Admitting: Radiation Oncology

## 2022-06-05 DIAGNOSIS — C7931 Secondary malignant neoplasm of brain: Secondary | ICD-10-CM | POA: Diagnosis not present

## 2022-06-05 LAB — RAD ONC ARIA SESSION SUMMARY
Course Elapsed Days: 0
Plan Fractions Treated to Date: 1
Plan Prescribed Dose Per Fraction: 20 Gy
Plan Total Fractions Prescribed: 1
Plan Total Prescribed Dose: 20 Gy
Reference Point Dosage Given to Date: 20 Gy
Reference Point Session Dosage Given: 20 Gy
Session Number: 1

## 2022-06-05 NOTE — Progress Notes (Addendum)
Patient vital are stable 124/88 pulse 66 rr 18 oxygen 98 % rm air. Patient denies any blurred vision headaches. States that he is feeling different  . Notified Dr. Isidore Moos she is currently in the room with the patient. Patient was given a beverage. Patient was advised to call [phone] if he has any issues. Patient was advised not to do any strenuous activity for the next 24 hours.

## 2022-06-05 NOTE — Op Note (Signed)
  Name: [name]  MRN: [id number]  Date: 06/05/2022   DOB: [birth year]  Stereotactic Radiosurgery Operative Note  PRE-OPERATIVE DIAGNOSIS:  Multiple Brain Metastases  POST-OPERATIVE DIAGNOSIS:  Multiple Brain Metastases  PROCEDURE:  Stereotactic Radiosurgery  SURGEON:  Charlie Pitter, MD  NARRATIVE: The patient underwent a radiation treatment planning session in the radiation oncology simulation suite under the care of the radiation oncology physician and physicist.  I participated closely in the radiation treatment planning afterwards. The patient underwent planning CT which was fused to 3T high resolution MRI with 1 mm axial slices.  These images were fused on the planning system.  We contoured the gross target volumes and subsequently expanded this to yield the Planning Target Volume. I actively participated in the planning process.  I helped to define and review the target contours and also the contours of the optic pathway, eyes, brainstem and selected nearby organs at risk.  All the dose constraints for critical structures were reviewed and compared to AAPM Task Group 101.  The prescription dose conformity was reviewed.  I approved the plan electronically.    Accordingly, [name] was brought to the TrueBeam stereotactic radiation treatment linac and placed in the custom immobilization mask.  The patient was aligned according to the IR fiducial markers with BrainLab Exactrac, then orthogonal x-rays were used in ExacTrac with the 6DOF robotic table and the shifts were made to align the patient  [name] received stereotactic radiosurgery uneventfully.    Lesions treated:   18    Complex lesions treated:  0 (>3.5 cm, <22mm of optic path, or within the brainstem)   The detailed description of the procedure is recorded in the radiation oncology procedure note.  I was present for the duration of the procedure.  DISPOSITION:  Following delivery, the patient was transported to  nursing in stable condition and monitored for possible acute effects to be discharged to home in stable condition with follow-up in one month.  Charlie Pitter, MD 06/05/2022 2:18 PM

## 2022-06-06 ENCOUNTER — Telehealth: Payer: Self-pay | Admitting: Radiation Therapy

## 2022-06-06 NOTE — Telephone Encounter (Signed)
I called to check in with [name], he completed his SRS course yesterday and complained of feeling "off or different" after the procedure. [name] reported that he is feeling much better today. He went home and took a long nap after the procedure, got up to eat supper and then went back to bed and slept through the night. [name] went to work today and did not have any issues. His only complaint now is that he is really tired. I have encouraged him to get some rest and listen to his body when he needs to sit down. He has my contact information to reach out if he has any other issues.   Mont Dutton R.T.(R)(T) Radiation Special Procedures Navigator

## 2022-06-26 ENCOUNTER — Encounter: Payer: Self-pay | Admitting: Nurse Practitioner

## 2022-06-26 NOTE — Progress Notes (Signed)
                                                                                                                                                             Patient Name: [name] MRN: [id number] DOB: [birth year] Referring Physician: Charlaine Dalton (Profile Not Attached) Date of Service: 06/05/2022 [facility] [place], [place]                                                        End Of Treatment Note  Diagnoses: C79.31-Secondary malignant neoplasm of brain  Cancer Staging: STAGE IV  Intent: Palliative  Radiation Treatment Dates: 06/05/2022 through 06/05/2022 (18 metastases treated)  Site Technique Total Dose (Gy) Dose per Fx (Gy) Completed Fx Beam Energies  Brain: Brain_SRS IMRT 20/20 20 1/1 [id number]   Narrative: The patient tolerated radiation therapy relatively well.   Plan: The patient will follow-up with radiation oncology in 69mo . -----------------------------------  Eppie Gibson, MD

## 2022-07-13 ENCOUNTER — Ambulatory Visit
Admission: RE | Admit: 2022-07-13 | Discharge: 2022-07-13 | Disposition: A | Discharge status: Home / Self Care | Payer: Managed Care, Other (non HMO) | Source: Ambulatory Visit | Attending: Radiation Oncology | Admitting: Radiation Oncology

## 2022-07-13 ENCOUNTER — Encounter: Payer: Self-pay | Admitting: Radiation Oncology

## 2022-07-13 VITALS — BP 122/80 | HR 65 | Temp 97.9°F | Resp 17 | Ht 70.0 in | Wt 235.4 lb

## 2022-07-13 DIAGNOSIS — C7931 Secondary malignant neoplasm of brain: Secondary | ICD-10-CM

## 2022-07-13 NOTE — Progress Notes (Signed)
Radiation Oncology         [phone] ________________________________  Name: [name] MRN: [id number]  Date: 07/13/2022  DOB: 1981-07-11  Follow-Up Visit Note  Outpatient  CC: Pcp, No  Brahmanday, Govinda R, *  Diagnosis and Prior Radiotherapy:    ICD-10-CM   1. Malignant neoplasm metastatic to brain ([place])  C79.31       CHIEF COMPLAINT: Here for follow-up and surveillance of brain cancer  Narrative:  The patient returns today for routine follow-up.  Doing well from a neurologic standpoint. He is concerned about thoracic progression - more SOB at times, with new cough. CT chest is pending for next week.                           ALLERGIES:  is allergic to codeine.  Meds: Current Outpatient Medications  Medication Sig Dispense Refill   apixaban (ELIQUIS) 5 MG TABS tablet Take 1 tablet (5 mg total) by mouth 2 (two) times daily. 60 tablet 2   folic acid (FOLVITE) 1 MG tablet Take 1 tablet (1 mg total) by mouth daily. 90 tablet 1   lidocaine-prilocaine (EMLA) cream Apply 1 application topically as needed. 30 g 0   metoprolol succinate (TOPROL XL) 25 MG 24 hr tablet Take 1 tablet (25 mg total) by mouth daily. (Patient taking differently: Take 50 mg by mouth daily.) 30 tablet 1   ondansetron (ZOFRAN) 8 MG tablet One pill every 8 hours as needed for nausea/vomitting. 40 tablet 1   prochlorperazine (COMPAZINE) 10 MG tablet Take 1 tablet (10 mg total) by mouth every 6 (six) hours as needed for nausea or vomiting. 40 tablet 1   sertraline (ZOLOFT) 50 MG tablet TAKE 1/2 TABLET BY MOUTH DAILY 30 tablet 2   TAGRISSO 80 MG tablet TAKE 1 TABLET DAILY 30 tablet 4   No current facility-administered medications for this encounter.   Facility-Administered Medications Ordered in Other Encounters  Medication Dose Route Frequency Provider Last Rate Last Admin   heparin lock flush 100 UNIT/ML injection            heparin lock flush 100 UNIT/ML injection            sodium chloride flush  (NS) 0.9 % injection 10 mL  10 mL Intravenous PRN [name], MD   10 mL at 07/28/21 7829    Physical Findings: The patient is in no acute distress. Patient is alert and oriented.  height is 5\' 10"  (1.778 m) and weight is 235 lb 6 oz (106.8 kg). His oral temperature is 97.9 F (36.6 C). His blood pressure is 122/80 and his pulse is 65. His respiration is 17 and oxygen saturation is 99%. .    General: Alert and oriented, in no acute distress HEENT: Head is normocephalic. Extraocular movements are intact.    Patchy alopecia over posterior scalp Heart: Regular in rate and rhythm with no murmurs, rubs, or gallops. Chest: Clear to auscultation bilaterally, with no rhonchi, wheezes, or rales.  Extremities: No cyanosis or edema. Lymphatics: see Neck Exam Skin: No concerning lesions. Musculoskeletal: symmetric strength and muscle tone throughout. Neurologic: Cranial nerves II through XII are grossly intact. No obvious focalities. Speech is fluent. Coordination is intact. Psychiatric: Judgment and insight are intact. Affect is appropriate.  KPS = 90  100 - Normal; no complaints; no evidence of disease. 90   - Able to carry on normal activity; minor signs or symptoms of disease.  80   - Normal activity with effort; some signs or symptoms of disease. 64   - Cares for self; unable to carry on normal activity or to do active work. 60   - Requires occasional assistance, but is able to care for most of his personal needs. 50   - Requires considerable assistance and frequent medical care. 48   - Disabled; requires special care and assistance. 64   - Severely disabled; hospital admission is indicated although death not imminent. 38   - Very sick; hospital admission necessary; active supportive treatment necessary. 10   - Moribund; fatal processes progressing rapidly. 0     - Dead  Karnofsky DA, Abelmann [place], Craver LS and Burchenal [facility] [phone]) The use of the nitrogen mustards in the palliative  treatment of carcinoma: with particular reference to bronchogenic carcinoma Cancer 1 [id number]    Lab Findings: Lab Results  Component Value Date   WBC 8.3 06/04/2022   HGB 13.8 06/04/2022   HCT 41.9 06/04/2022   MCV 87.3 06/04/2022   PLT 246 06/04/2022    Radiographic Findings: No results found.  Impression/Plan:  Recovering well from [facility] to brain   I wished him the best for next week (CT chest pending, med/onc f/u pending)  We will see him back in 68mo after restaging BRAIN MRI.  He is pleased with this plan.   On date of service, in total, I spent 20 minutes on this encounter. Patient was seen in person.  _____________________________________   Eppie Gibson, MD

## 2022-07-13 NOTE — Progress Notes (Signed)
Mr. Uphoff presents today for follow-up after completing single SRS treatment on 06/05/2022  Dose of Decadron, if applicable: not currently prescribed  Recent neurologic symptoms, if any:  Seizures: None Headaches: Reports he expereinced a headache for the week he received his SRS treatment, but since then denies any occurances Nausea: None Dizziness/ataxia: None Difficulty with hand coordination: Denies Focal numbness/weakness: Denies Visual deficits/changes: Denies Confusion/Memory deficits: None  Additional Complaints / other details: Overall reports he's doing well and feels good. He is scheduled to have chest CT on 07/16/2022 and then to see his medical oncologist on 07/18/2022

## 2022-07-16 ENCOUNTER — Ambulatory Visit
Admission: RE | Admit: 2022-07-16 | Discharge: 2022-07-16 | Disposition: A | Discharge status: Home / Self Care | Payer: Managed Care, Other (non HMO) | Source: Ambulatory Visit | Attending: Internal Medicine | Admitting: Internal Medicine

## 2022-07-16 ENCOUNTER — Other Ambulatory Visit: Payer: Self-pay | Admitting: Radiation Therapy

## 2022-07-16 DIAGNOSIS — C3432 Malignant neoplasm of lower lobe, left bronchus or lung: Secondary | ICD-10-CM | POA: Diagnosis present

## 2022-07-16 DIAGNOSIS — C7931 Secondary malignant neoplasm of brain: Secondary | ICD-10-CM

## 2022-07-16 MED ORDER — IOHEXOL 300 MG/ML  SOLN
75.0000 mL | Freq: Once | INTRAMUSCULAR | Status: AC | PRN
  Administered 2022-07-16: 75 mL via INTRAVENOUS

## 2022-07-18 ENCOUNTER — Inpatient Hospital Stay: Payer: Managed Care, Other (non HMO) | Attending: Internal Medicine

## 2022-07-18 ENCOUNTER — Inpatient Hospital Stay (HOSPITAL_BASED_OUTPATIENT_CLINIC_OR_DEPARTMENT_OTHER): Payer: Managed Care, Other (non HMO) | Admitting: Internal Medicine

## 2022-07-18 VITALS — BP 117/79 | HR 66 | Temp 96.9°F | Resp 16 | Wt 234.0 lb

## 2022-07-18 DIAGNOSIS — C3432 Malignant neoplasm of lower lobe, left bronchus or lung: Secondary | ICD-10-CM | POA: Insufficient documentation

## 2022-07-18 DIAGNOSIS — J90 Pleural effusion, not elsewhere classified: Secondary | ICD-10-CM

## 2022-07-18 DIAGNOSIS — C7931 Secondary malignant neoplasm of brain: Secondary | ICD-10-CM | POA: Insufficient documentation

## 2022-07-18 DIAGNOSIS — Z7901 Long term (current) use of anticoagulants: Secondary | ICD-10-CM | POA: Insufficient documentation

## 2022-07-18 DIAGNOSIS — Z86711 Personal history of pulmonary embolism: Secondary | ICD-10-CM | POA: Insufficient documentation

## 2022-07-18 DIAGNOSIS — R059 Cough, unspecified: Secondary | ICD-10-CM | POA: Insufficient documentation

## 2022-07-18 DIAGNOSIS — C7951 Secondary malignant neoplasm of bone: Secondary | ICD-10-CM | POA: Insufficient documentation

## 2022-07-18 DIAGNOSIS — Z452 Encounter for adjustment and management of vascular access device: Secondary | ICD-10-CM | POA: Diagnosis not present

## 2022-07-18 DIAGNOSIS — Z95828 Presence of other vascular implants and grafts: Secondary | ICD-10-CM

## 2022-07-18 LAB — COMPREHENSIVE METABOLIC PANEL
ALT: 42 U/L (ref 0–44)
AST: 38 U/L (ref 15–41)
Albumin: 3.6 g/dL (ref 3.5–5.0)
Alkaline Phosphatase: 162 U/L — ABNORMAL HIGH (ref 38–126)
Anion gap: 4 — ABNORMAL LOW (ref 5–15)
BUN: 9 mg/dL (ref 6–20)
CO2: 26 mmol/L (ref 22–32)
Calcium: 8 mg/dL — ABNORMAL LOW (ref 8.9–10.3)
Chloride: 106 mmol/L (ref 98–111)
Creatinine, Ser: 1.17 mg/dL (ref 0.61–1.24)
GFR, Estimated: >60 mL/min (ref >60)
Glucose, Bld: 88 mg/dL (ref 70–99)
Potassium: 3.7 mmol/L (ref 3.5–5.1)
Sodium: 136 mmol/L (ref 135–145)
Total Bilirubin: 0.6 mg/dL (ref 0.3–1.2)
Total Protein: 7.3 g/dL (ref 6.5–8.1)

## 2022-07-18 LAB — CBC WITH DIFFERENTIAL/PLATELET
Abs Immature Granulocytes: 0.04 10*3/uL (ref 0.00–0.07)
Basophils Absolute: 0.1 10*3/uL (ref 0.0–0.1)
Basophils Relative: 1 %
Eosinophils Absolute: 0.2 10*3/uL (ref 0.0–0.5)
Eosinophils Relative: 3 %
HCT: 40.4 % (ref 39.0–52.0)
Hemoglobin: 13.4 g/dL (ref 13.0–17.0)
Immature Granulocytes: 1 %
Lymphocytes Relative: 15 %
Lymphs Abs: 1.1 10*3/uL (ref 0.7–4.0)
MCH: 28.8 pg (ref 26.0–34.0)
MCHC: 33.2 g/dL (ref 30.0–36.0)
MCV: 86.9 fL (ref 80.0–100.0)
Monocytes Absolute: 0.9 10*3/uL (ref 0.1–1.0)
Monocytes Relative: 12 %
Neutro Abs: 5.2 10*3/uL (ref 1.7–7.7)
Neutrophils Relative %: 68 %
Platelets: 252 10*3/uL (ref 150–400)
RBC: 4.65 MIL/uL (ref 4.22–5.81)
RDW: 13.5 % (ref 11.5–15.5)
WBC: 7.5 10*3/uL (ref 4.0–10.5)
nRBC: 0 % (ref 0.0–0.2)

## 2022-07-18 MED ORDER — HEPARIN SOD (PORK) LOCK FLUSH 100 UNIT/ML IV SOLN
500.0000 [IU] | Freq: Once | INTRAVENOUS | Status: AC
  Administered 2022-07-18: 500 [IU] via INTRAVENOUS
  Filled 2022-07-18: qty 5

## 2022-07-18 MED ORDER — SODIUM CHLORIDE 0.9% FLUSH
10.0000 mL | Freq: Once | INTRAVENOUS | Status: AC
  Administered 2022-07-18: 10 mL via INTRAVENOUS
  Filled 2022-07-18: qty 10

## 2022-07-18 NOTE — Progress Notes (Signed)
[place] NOTE  Patient Care Team: Pcp, No as PCP - General [name], RN as Oncology Nurse Navigator [name], MD as Consulting Physician (Internal Medicine)  CHIEF COMPLAINTS/PURPOSE OF CONSULTATION: Lung cancer  #  Oncology History Overview Note  IMPRESSION: 1. Patchy nodular fat stranding throughout the anterior left upper quadrant peritoneal fat, nonspecific, cannot exclude peritoneal carcinomatosis. Dedicated CT abdomen/pelvis with oral and IV contrast recommended for further evaluation. 2. Dense patchy consolidation replacing much of the left lower lung lobe, appearing masslike in the superior segment left lower lobe, with associated bulging of the left major fissure and associated left lower lobe volume loss. Fine nodularity throughout both lungs with an upper lobe predominance. Asymmetric left upper lobe interlobular septal thickening. These findings are indeterminate, with differential including multilobar pneumonia, sarcoidosis or a neoplastic process. The persistence on radiographs back to 01/20/2021 despite antibiotic therapy make sarcoidosis or a neoplastic process more likely. Pulmonology consultation suggested for consideration of bronchoscopic evaluation. 3. Small dependent left pleural effusion. 4. Mild mediastinal lymphadenopathy, nonspecific. 5. Subacute healing lateral right sixth rib fracture.   DIAGNOSIS:  A. LUNG, LEFT LOWER LOBE; ENB-ASSISTED BIOPSY:  - NON-SMALL CELL CARCINOMA, FAVOR ADENOCARCINOMA.  - FOREIGN MATERIAL SUGGESTIVE OF ASPIRATION.   # EGFR MUTATED: 73 del- June 28th, 2022- [name]. [limited]  # Asymptomatic multiple brain mets subcentimeter asymptomatic -JUNE 29th, 2023- Numerous metastatic lesions in the supratentorial brain-increase in number also conspicuity compared to January 2023.  Pre-SBRT MRI brain AUG 18th, 2023-  Eighteen small enhancing brain metastases (punctate to 11 mm); were all  present on 03/28/2021-s/p SBRT SEP 2023- [[place]]   Cancer of lower lobe of left lung ([place])  03/23/2021 Initial Diagnosis   Cancer of lower lobe of left lung ([place])   03/29/2021 Cancer Staging   Staging form: Lung, AJCC 8th Edition - Clinical: Stage IVB (cT3, cN3, pM1c) - Signed by [name], MD on 03/29/2021   03/30/2021 - 03/30/2021 Chemotherapy   Patient is on Treatment Plan : LUNG NSCLC Pemetrexed ([place]) / Carboplatin q21d x 1 cycles        HISTORY OF PRESENTING ILLNESS: Ambulating independently.  With wife.   [name] 41 y.o.  male patient with stage IV lung cancer adenocarcinoma-brain mets [synchronous] bone mets EGFR mutated on osimertinib is here for follow-up/review results of the chest CT scan.  In the interim patient was evaluated by radiation oncology and also neurooncology in [place]-given his progressive asymptomatic brain metastasis.  He is currently status post SBRT to his brain lesion.  Complains of worsening shortness of breath.  Mild to moderate cough. He continues to be on Osimertinib.  He admits to compliance.  Denies any worsening pain. Patient continues to have intermittent groin pain especially with ambulation.  Denies any nausea no vomiting.  No worsening skin rash.   Review of Systems  Constitutional:  Positive for malaise/fatigue. Negative for chills, diaphoresis and fever.  HENT:  Negative for nosebleeds and sore throat.   Eyes:  Negative for double vision.  Respiratory:  Negative for wheezing.   Cardiovascular:  Negative for chest pain, palpitations, orthopnea and leg swelling.  Gastrointestinal:  Negative for abdominal pain, blood in stool, diarrhea, heartburn, melena, nausea and vomiting.  Genitourinary:  Negative for dysuria, frequency and urgency.  Musculoskeletal:  Negative for back pain and joint pain.  Skin:  Negative for itching.  Neurological:  Positive for weakness. Negative for dizziness, tingling, focal weakness and  headaches.  Endo/Heme/Allergies:  Does  not bruise/bleed easily.  Psychiatric/Behavioral:  Negative for depression. The patient is not nervous/anxious and does not have insomnia.      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    SURGICAL HISTORY: Past Surgical History:  Procedure Laterality Date   FRACTURE SURGERY     broke femur when he was 6 years   PORTA CATH INSERTION N/A 03/28/2021   Procedure: PORTA CATH INSERTION;  Surgeon: Katha Cabal, MD;  Location: [facility];  Service: Cardiovascular;  Laterality: N/A;   VIDEO BRONCHOSCOPY WITH ENDOBRONCHIAL NAVIGATION N/A 03/15/2021   Procedure: VIDEO BRONCHOSCOPY WITH ENDOBRONCHIAL NAVIGATION;  Surgeon: Ottie Glazier, MD;  Location: [facility];  Service: Thoracic;  Laterality: N/A;   VIDEO BRONCHOSCOPY WITH ENDOBRONCHIAL ULTRASOUND N/A 03/15/2021   Procedure: VIDEO BRONCHOSCOPY WITH ENDOBRONCHIAL ULTRASOUND;  Surgeon: Ottie Glazier, MD;  Location: [facility];  Service: Thoracic;  Laterality: N/A;    SOCIAL HISTORY: Social History   Socioeconomic History   Marital status: Married    Spouse name: [name]   Number of children: 2   Years of education: Not on file   Highest education level: Not on file  Occupational History   Not on file  Tobacco Use   Smoking status: Never   Smokeless tobacco: Current    Types: Snuff, Chew  Vaping Use   Vaping Use: Never used  Substance and Sexual Activity   Alcohol use: Never   Drug use: Never   Sexual activity: Not on file  Other Topics Concern   Not on file  Social History Narrative   [place]; with wife; 2 daughters[12 and 22]; never smoked; rare alcohol. Work in saw [employer]    Social Determinants of Health   Financial Resource Strain: Low Risk  (05/10/2022)   Overall Financial Resource Strain (CARDIA)    Difficulty of Paying Living  Expenses: Not hard at all  Food Insecurity: No Food Insecurity (05/10/2022)   Hunger Vital Sign    Worried About Running Out of Food in the Last Year: Never true    Ran Out of Food in the Last Year: Never true  Transportation Needs: No Transportation Needs (03/16/2022)   PRAPARE - Hydrologist (Medical): No    Lack of Transportation (Non-Medical): No  Physical Activity: Not on file  Stress: Not on file  Social Connections: Socially Integrated (05/10/2022)   Social Connection and Isolation Panel [NHANES]    Frequency of Communication with Friends and Family: More than three times a week    Frequency of Social Gatherings with Friends and Family: More than three times a week    Attends Religious Services: More than 4 times per year    Active Member of [employer] or Organizations: Yes    Attends Music therapist: More than 4 times per year    Marital Status: Married  Human resources officer Violence: Not on file    FAMILY HISTORY: Family History  Problem Relation Age of Onset   High Cholesterol Mother    Hypertension Father    Cancer Father    Lung cancer Father    Skin cancer Father     ALLERGIES:  is allergic to codeine.  MEDICATIONS:  Current Outpatient Medications  Medication Sig Dispense Refill   apixaban (ELIQUIS) 5 MG TABS tablet Take 1  tablet (5 mg total) by mouth 2 (two) times daily. 60 tablet 2   folic acid (FOLVITE) 1 MG tablet Take 1 tablet (1 mg total) by mouth daily. 90 tablet 1   lidocaine-prilocaine (EMLA) cream Apply 1 application topically as needed. 30 g 0   metoprolol succinate (TOPROL-XL) 50 MG 24 hr tablet Take 50 mg by mouth daily. Take with or immediately following a meal.     sertraline (ZOLOFT) 50 MG tablet TAKE 1/2 TABLET BY MOUTH DAILY 30 tablet 2   TAGRISSO 80 MG tablet TAKE 1 TABLET DAILY 30 tablet 4   ondansetron (ZOFRAN) 8 MG tablet One pill every 8 hours as needed for nausea/vomitting. (Patient not taking: Reported on  07/18/2022) 40 tablet 1   prochlorperazine (COMPAZINE) 10 MG tablet Take 1 tablet (10 mg total) by mouth every 6 (six) hours as needed for nausea or vomiting. (Patient not taking: Reported on 07/18/2022) 40 tablet 1   No current facility-administered medications for this visit.   Facility-Administered Medications Ordered in Other Visits  Medication Dose Route Frequency Provider Last Rate Last Admin   heparin lock flush 100 UNIT/ML injection            heparin lock flush 100 UNIT/ML injection            sodium chloride flush (NS) 0.9 % injection 10 mL  10 mL Intravenous PRN [name], MD   10 mL at 07/28/21 0852      .  PHYSICAL EXAMINATION: ECOG PERFORMANCE STATUS: 1 - Symptomatic but completely ambulatory  Vitals:   07/18/22 1551  BP: 117/79  Pulse: 66  Resp: 16  Temp: (!) 96.9 F (36.1 C)  SpO2: 98%     Filed Weights   07/18/22 1551  Weight: 234 lb (106.1 kg)       Physical Exam HENT:     Head: Normocephalic and atraumatic.     Mouth/Throat:     Pharynx: No oropharyngeal exudate.  Eyes:     Pupils: Pupils are equal, round, and reactive to light.  Cardiovascular:     Rate and Rhythm: Normal rate and regular rhythm.  Pulmonary:     Effort: No respiratory distress.     Breath sounds: No wheezing.     Comments: Decreased breath sound on the left side compared to right. Abdominal:     General: Bowel sounds are normal. There is no distension.     Palpations: Abdomen is soft. There is no mass.     Tenderness: There is no abdominal tenderness. There is no guarding or rebound.  Musculoskeletal:        General: No tenderness. Normal range of motion.     Cervical back: Normal range of motion and neck supple.  Skin:    General: Skin is warm.  Neurological:     Mental Status: He is alert and oriented to person, place, and time.  Psychiatric:        Mood and Affect: Affect normal.    Fungal dermatitis bilateral buttocks groin/underneath abdominal  pannus  LABORATORY DATA:  I have reviewed the data as listed Lab Results  Component Value Date   WBC 7.5 07/18/2022   HGB 13.4 07/18/2022   HCT 40.4 07/18/2022   MCV 86.9 07/18/2022   PLT 252 07/18/2022   Recent Labs    04/30/22 1511 06/04/22 1501 07/18/22 1526  NA 133* 136 136  K 4.1 3.7 3.7  CL 106 107 106  CO2 19* 24 26  GLUCOSE  86 86 88  BUN '11 13 9  ' CREATININE 1.33* 1.10 1.17  CALCIUM 8.6* 8.2* 8.0*  GFRNONAA >60 >60 >60  PROT 7.3 7.3 7.3  ALBUMIN 3.8 3.7 3.6  AST 39 36 38  ALT 44 52* 42  ALKPHOS 106 123 162*  BILITOT 0.3 0.5 0.6    RADIOGRAPHIC STUDIES: I have personally reviewed the radiological images as listed and agreed with the findings in the report. CT CHEST W CONTRAST  Result Date: 07/18/2022 CLINICAL DATA:  41 year old male with history of non-small cell lung cancer. Evaluate for treatment response. * Tracking Code: BO * EXAM: CT CHEST WITH CONTRAST TECHNIQUE: Multidetector CT imaging of the chest was performed during intravenous contrast administration. RADIATION DOSE REDUCTION: This exam was performed according to the departmental dose-optimization program which includes automated exposure control, adjustment of the mA and/or kV according to patient size and/or use of iterative reconstruction technique. CONTRAST:  75m OMNIPAQUE IOHEXOL 300 MG/ML  SOLN COMPARISON:  PET-CT 01/31/2022. CT of the chest, abdomen and pelvis 12/22/2021. FINDINGS: Cardiovascular: Heart size is normal. There is no significant pericardial fluid, thickening or pericardial calcification. No atherosclerotic calcifications are noted in the thoracic aorta or the coronary arteries. Right internal jugular single-lumen Port-A-Cath with tip terminating at the superior cavoatrial junction. Mediastinum/Nodes: No pathologically enlarged mediastinal or hilar lymph nodes. Esophagus is unremarkable in appearance. No axillary lymphadenopathy. Lungs/Pleura: There is again a chronic mass-like area of  architectural distortion and substantial volume loss in the left lower lobe at the site of the treated neoplasm, unchanged compared to prior examinations, presumably reflective of chronic postradiation mass-like fibrosis. Substantial volume loss and architectural distortion is also noted in the left upper lobe. Large chronic thick-walled left pleural effusion with complex internal architecture, including what appear to be several internal septations and variable degrees of attenuation. This effusion has increased slightly in size compared to the prior examination. No definite focal mass confidently identified within this left-sided pleural fluid collection. Previously noted right middle lobe ground-glass attenuation nodule has resolved, now with a 4 mm subpleural nodule in its wake (axial image 99 of series 3) abutting the major fissure, nonspecific, but statistically likely benign. No other definite suspicious appearing pulmonary nodules or masses are noted. 6 mm ground-glass attenuation nodule in the right lower lobe (axial image 99 of series 3), new compared to the prior examination, but nonspecific. Upper Abdomen: Unremarkable. Musculoskeletal: There are no aggressive appearing lytic or blastic lesions noted in the visualized portions of the skeleton. IMPRESSION: 1. Enlarging large complex thick walled left-sided pleural fluid collection. Given the hypermetabolism noted on the prior PET-CT, residual pleural disease is suspected, and sampling of the pleural fluid should be considered if clinically appropriate at this time. 2. Stable appearance of chronic postradiation mass-like fibrosis in the left lower lobe. 3. Small nonspecific right-sided pulmonary nodules, as above, likely benign. Attention on routine follow-up imaging is recommended. Electronically Signed   By: DVinnie LangtonM.D.   On: 07/18/2022 09:02    ASSESSMENT & PLAN:   [place] ST[place]- Left lower lobe lung  cancer-adenocarcinoma the lung;  EGFR- 19 del POSITIVE. Currently on osimertinib 80 mg a day [osimertinib [since June 28th, 2022].  OCT 2nd, 2023- CT scan- Enlarging large complex thick walled left-sided pleural fluid collection. Given the hypermetabolism noted on the prior PET-CT, residual pleural disease is suspected, and sampling of the pleural fluid should be considered if clinically appropriate at this time.  Stable  appearance of chronic postradiation mass-like fibrosis in lhe left lower lobe.  No worsening axillary adenopathy noted.   #Recommend thoracentesis for further evaluation of the worsening pleural effusion; last thora in July 2023.  Given the concerns for pain during the procedure-IR regarding possible sedation.  Patient declines any antianxiety medications.   #Asymptomatic multiple brain mets subcentimeter asymptomatic -JUNE 29th, 2023- Numerous metastatic lesions in the supratentorial brain-increase in number also conspicuity compared to January 2023.  Pre-SBRT MRI brain AUG 18th, 2023-  Eighteen small enhancing brain metastases (punctate to 11 mm); were all present on 03/28/2021-s/p SBRT SEP 2023- [[place]]. Will repeat imaging in ? 3 months.   #Left lung PE [incidental on CT SEP 13th,2022-]?Symptomatic DVT of the right calf-on Eliquis-STABLE.  #Bone metastases- CT DEc 7th, 2022 to healing process.  Dental evaluation on 9/21.  Continue calcium vitamin D intake. calcium 8.1; ?  Osimertinib. HOLD zometa.   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  Will refill- STABLE.  # Continued hypocalcemia- ?  Symptomatic cramps.  Vitamin D levels normal.  CK420-June; July 2023 with repeat CK-within normal limits.  Monitor for now  # IV access/poor access-: port flush.   # DISPOSITION: # left thoracentesis  # follow up in the week of 23rd-  MD; NO labs-.. Dr.B     # I reviewed the blood work- with the patient in detail; also reviewed the  imaging independently [as summarized above]; and with the patient in detail.                 All questions were answered. The patient knows to call the clinic with any problems, questions or concerns.    [name], MD 07/20/2022 12:38 PM

## 2022-07-18 NOTE — Assessment & Plan Note (Addendum)
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 Currently on osimertinib 80 mg a day [osimertinib [since June 28th, 2022].  OCT 2nd, 2023- CT scan- Enlarging large complex thick walled left-sided pleural fluid collection. Given the hypermetabolism noted on the prior PET-CT, residual pleural disease is suspected, and sampling of the pleural fluid should be considered if clinically appropriate at this time.  Stable appearance of chronic postradiation mass-like fibrosis in lhe left lower lobe.  No worsening axillary adenopathy noted.   #Recommend thoracentesis for further evaluation of the worsening pleural effusion; last thora in July 2023.  Given the concerns for pain during the procedure-IR regarding possible sedation.  Patient declines any antianxiety medications.   #Asymptomatic multiple brain mets subcentimeter asymptomatic -JUNE 29th, 2023- Numerous metastatic lesions in the supratentorial brain-increase in number also conspicuity compared to January 2023.  Pre-SBRT MRI brain AUG 18th, 2023-  Eighteen small enhancing brain metastases (punctate to 11 mm); were all present on 03/28/2021-s/p SBRT SEP 2023- [[place]]. Will repeat imaging in ? 3 months.   #Left lung PE [incidental on CT SEP 13th,2022-]?Symptomatic DVT of the right calf-on Eliquis-STABLE.  #Bone metastases- CT DEc 7th, 2022 to healing process.  Dental evaluation on 9/21.  Continue calcium vitamin D intake. calcium 8.1; ?  Osimertinib. HOLD zometa.   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  Will refill- STABLE.  # Continued hypocalcemia- ?  Symptomatic cramps.  Vitamin D levels normal.  CK420-June; July 2023 with repeat CK-within normal limits.  Monitor for now  # IV access/poor access-: port flush.   # DISPOSITION: # left thoracentesis  # follow up in the week of 23rd-  MD; NO labs-.. Dr.B     # I reviewed the blood work- with the patient  in detail; also reviewed the imaging independently [as summarized above]; and with the patient in detail.

## 2022-07-18 NOTE — Progress Notes (Signed)
Pt in for follow up, reports increased episodes of cough at night for the past 3 weeks.

## 2022-07-19 ENCOUNTER — Telehealth: Payer: Self-pay | Admitting: *Deleted

## 2022-07-19 NOTE — Telephone Encounter (Signed)
Patient returned call regarding appointment for thoracentesis for next week. Nurse explained that per IR procedure cannot be done with sedation but more lidocaine may be used to help with discomfort. Patient verbalized understanding and is agreeable to appointment for thoracentesis on Thursday 10/12-arrive at 12:30 pm for 1:00 procedure. Team updated in secure chat. Patient encouraged to call with any questions or concerns.

## 2022-07-19 NOTE — Telephone Encounter (Signed)
Call placed to patients wife, not able to reach patient or leave vm at this time. Advised that after discussion with IR sedation is not possible with thoracentesis. IR can use more lidocaine to help with discomfort associated with procedure. IR has tentatively scheduled patient for next Thursday but will await call back from patient or wife prior to confirming.

## 2022-07-20 ENCOUNTER — Encounter: Payer: Self-pay | Admitting: Nurse Practitioner

## 2022-07-20 ENCOUNTER — Other Ambulatory Visit: Payer: Self-pay | Admitting: Radiation Therapy

## 2022-07-26 ENCOUNTER — Ambulatory Visit
Admission: RE | Admit: 2022-07-26 | Discharge: 2022-07-26 | Disposition: A | Discharge status: Home / Self Care | Payer: Managed Care, Other (non HMO) | Source: Ambulatory Visit | Attending: Internal Medicine | Admitting: Internal Medicine

## 2022-07-26 ENCOUNTER — Ambulatory Visit
Admission: RE | Admit: 2022-07-26 | Discharge: 2022-07-26 | Disposition: A | Discharge status: Home / Self Care | Payer: Managed Care, Other (non HMO) | Source: Ambulatory Visit | Attending: Student | Admitting: Student

## 2022-07-26 ENCOUNTER — Other Ambulatory Visit: Payer: Self-pay | Admitting: Student

## 2022-07-26 DIAGNOSIS — C3432 Malignant neoplasm of lower lobe, left bronchus or lung: Secondary | ICD-10-CM | POA: Insufficient documentation

## 2022-07-26 DIAGNOSIS — Z9889 Other specified postprocedural states: Secondary | ICD-10-CM | POA: Diagnosis present

## 2022-07-26 DIAGNOSIS — J90 Pleural effusion, not elsewhere classified: Secondary | ICD-10-CM | POA: Diagnosis not present

## 2022-07-26 LAB — BODY FLUID CELL COUNT WITH DIFFERENTIAL
Eos, Fluid: 5 %
Lymphs, Fluid: 9 %
Monocyte-Macrophage-Serous Fluid: 11 %
Neutrophil Count, Fluid: 75 %
Total Nucleated Cell Count, Fluid: 1567 cu mm

## 2022-07-26 LAB — LACTATE DEHYDROGENASE, PLEURAL OR PERITONEAL FLUID: LD, Fluid: 2651 U/L — ABNORMAL HIGH (ref 3–23)

## 2022-07-26 LAB — GLUCOSE, PLEURAL OR PERITONEAL FLUID: Glucose, Fluid: <20 mg/dL

## 2022-07-26 LAB — PROTEIN, PLEURAL OR PERITONEAL FLUID: Total protein, fluid: 3.1 g/dL

## 2022-07-26 NOTE — Procedures (Signed)
PROCEDURE SUMMARY:  Successful, yet difficult US guided left diagnostic thoracentesis requiring attempts with 2 different types of catheters and incomplete evacuation of fluid due to complexity/consistency. Dr. Anselm Pancoast able to aspirate 200 mL of dark, bloody fluid. Pt tolerated procedure well.   No immediate complications.  Specimen was sent for labs. CXR ordered.  EBL < 5 mL  Docia Barrier PA-C 07/26/2022 2:25 PM

## 2022-07-30 LAB — BODY FLUID CULTURE W GRAM STAIN: Culture: NO GROWTH

## 2022-07-30 LAB — CYTOLOGY - NON PAP

## 2022-08-10 ENCOUNTER — Telehealth: Payer: Self-pay

## 2022-08-10 ENCOUNTER — Encounter: Payer: Self-pay | Admitting: Internal Medicine

## 2022-08-10 ENCOUNTER — Inpatient Hospital Stay: Payer: Managed Care, Other (non HMO)

## 2022-08-10 ENCOUNTER — Inpatient Hospital Stay (HOSPITAL_BASED_OUTPATIENT_CLINIC_OR_DEPARTMENT_OTHER): Payer: Managed Care, Other (non HMO) | Admitting: Internal Medicine

## 2022-08-10 VITALS — BP 120/79 | HR 73 | Temp 98.8°F | Resp 17 | Wt 230.0 lb

## 2022-08-10 DIAGNOSIS — C3432 Malignant neoplasm of lower lobe, left bronchus or lung: Secondary | ICD-10-CM

## 2022-08-10 DIAGNOSIS — R7989 Other specified abnormal findings of blood chemistry: Secondary | ICD-10-CM | POA: Diagnosis not present

## 2022-08-10 LAB — CBC WITH DIFFERENTIAL/PLATELET
Abs Immature Granulocytes: 0.03 10*3/uL (ref 0.00–0.07)
Basophils Absolute: 0 10*3/uL (ref 0.0–0.1)
Basophils Relative: 1 %
Eosinophils Absolute: 0.2 10*3/uL (ref 0.0–0.5)
Eosinophils Relative: 3 %
HCT: 44.2 % (ref 39.0–52.0)
Hemoglobin: 14.4 g/dL (ref 13.0–17.0)
Immature Granulocytes: 1 %
Lymphocytes Relative: 14 %
Lymphs Abs: 0.9 10*3/uL (ref 0.7–4.0)
MCH: 28.6 pg (ref 26.0–34.0)
MCHC: 32.6 g/dL (ref 30.0–36.0)
MCV: 87.7 fL (ref 80.0–100.0)
Monocytes Absolute: 0.9 10*3/uL (ref 0.1–1.0)
Monocytes Relative: 15 %
Neutro Abs: 4 10*3/uL (ref 1.7–7.7)
Neutrophils Relative %: 66 %
Platelets: 213 10*3/uL (ref 150–400)
RBC: 5.04 MIL/uL (ref 4.22–5.81)
RDW: 13.3 % (ref 11.5–15.5)
WBC: 6 10*3/uL (ref 4.0–10.5)
nRBC: 0 % (ref 0.0–0.2)

## 2022-08-10 LAB — COMPREHENSIVE METABOLIC PANEL
ALT: 110 U/L — ABNORMAL HIGH (ref 0–44)
AST: 100 U/L — ABNORMAL HIGH (ref 15–41)
Albumin: 3.6 g/dL (ref 3.5–5.0)
Alkaline Phosphatase: 266 U/L — ABNORMAL HIGH (ref 38–126)
Anion gap: 7 (ref 5–15)
BUN: 11 mg/dL (ref 6–20)
CO2: 26 mmol/L (ref 22–32)
Calcium: 8.5 mg/dL — ABNORMAL LOW (ref 8.9–10.3)
Chloride: 101 mmol/L (ref 98–111)
Creatinine, Ser: 1.21 mg/dL (ref 0.61–1.24)
GFR, Estimated: >60 mL/min (ref >60)
Glucose, Bld: 94 mg/dL (ref 70–99)
Potassium: 3.8 mmol/L (ref 3.5–5.1)
Sodium: 134 mmol/L — ABNORMAL LOW (ref 135–145)
Total Bilirubin: 0.8 mg/dL (ref 0.3–1.2)
Total Protein: 7.6 g/dL (ref 6.5–8.1)

## 2022-08-10 LAB — LACTATE DEHYDROGENASE: LDH: 283 U/L — ABNORMAL HIGH (ref 98–192)

## 2022-08-10 MED ORDER — ONDANSETRON HCL 8 MG PO TABS: ORAL_TABLET | ORAL | 1 refills | Status: DC

## 2022-08-10 MED ORDER — PROCHLORPERAZINE MALEATE 10 MG PO TABS: 10.0000 mg | ORAL_TABLET | Freq: Four times a day (QID) | ORAL | 1 refills | Status: DC | PRN

## 2022-08-10 NOTE — Progress Notes (Signed)
Patient here for oncology follow-up appointment,  concerns of fatigue, headaches, and chest discomfort

## 2022-08-10 NOTE — Progress Notes (Signed)
[facility] NOTE  Patient Care Team: Pcp, No as PCP - General Nathan Nab, RN as Oncology Nurse Navigator Nathan Sickle, MD as Consulting Physician (Internal Medicine)  CHIEF COMPLAINTS/PURPOSE OF CONSULTATION: Lung cancer  #  Oncology History Overview Note  IMPRESSION: 1. Patchy nodular fat stranding throughout the anterior left upper quadrant peritoneal fat, nonspecific, cannot exclude peritoneal carcinomatosis. Dedicated CT abdomen/pelvis with oral and IV contrast recommended for further evaluation. 2. Dense patchy consolidation replacing much of the left lower lung lobe, appearing masslike in the superior segment left lower lobe, with associated bulging of the left major fissure and associated left lower lobe volume loss. Fine nodularity throughout both lungs with an upper lobe predominance. Asymmetric left upper lobe interlobular septal thickening. These findings are indeterminate, with differential including multilobar pneumonia, sarcoidosis or a neoplastic process. The persistence on radiographs back to 01/20/2021 despite antibiotic therapy make sarcoidosis or a neoplastic process more likely. Pulmonology consultation suggested for consideration of bronchoscopic evaluation. 3. Small dependent left pleural effusion. 4. Mild mediastinal lymphadenopathy, nonspecific. 5. Subacute healing lateral right sixth rib fracture.   DIAGNOSIS:  A. LUNG, LEFT LOWER LOBE; ENB-ASSISTED BIOPSY:  - NON-SMALL CELL CARCINOMA, FAVOR ADENOCARCINOMA.  - FOREIGN MATERIAL SUGGESTIVE OF ASPIRATION.   # EGFR MUTATED: 62 del- June 28th, 2022- [name]. [limited]  # Asymptomatic multiple brain mets subcentimeter asymptomatic -JUNE 29th, 2023- Numerous metastatic lesions in the supratentorial brain-increase in number also conspicuity compared to January 2023.  Pre-SBRT MRI brain AUG 18th, 2023-  Eighteen small enhancing brain metastases (punctate to 11 mm); were all  present on 03/28/2021-s/p SBRT SEP 2023- [[place]]  # SEP-OCT-worsening left-sided pleural effusion-status post paracentesis; exudative; Cytology negative- ?  Progressive disease..   Cancer of lower lobe of left lung ([place])  03/23/2021 Initial Diagnosis   Cancer of lower lobe of left lung ([place])   03/29/2021 Cancer Staging   Staging form: Lung, AJCC 8th Edition - Clinical: Stage IVB (cT3, cN3, pM1c) - Signed by Nathan Sickle, MD on 03/29/2021   03/30/2021 - 03/30/2021 Chemotherapy   Patient is on Treatment Plan : LUNG NSCLC Pemetrexed ([place]) / Carboplatin q21d x 1 cycles     08/16/2022 -  Chemotherapy   Patient is on Treatment Plan : LUNG Atezolizumab + Bevacizumab + Carboplatin + Paclitaxel q21d Induction x 4 cycles / Atezolizumab + Bevacizumab q21d Maintenance        HISTORY OF PRESENTING ILLNESS: Ambulating independently.  With wife.   [name] 41 y.o.  male patient with stage IV lung cancer adenocarcinoma-brain mets [synchronous] bone mets EGFR mutated on osimertinib is here for follow-up/review results of the thoracentesis.Patient's thoracentesis was uncomplicated  Patient went on a hunting trip last week. Felt poorly during the trip. Complains of headaches; come and go; intermittent.  No nausea or vomiting.  Reflux like symptoms. Complains of  "uncomfortable feeling around the chest" . Mild to moderate cough. He continues to be on Osimertinib.  He admits to compliance.   Denies any worsening pain.  No worsening skin rash.  Review of Systems  Constitutional:  Positive for malaise/fatigue. Negative for chills, diaphoresis and fever.  HENT:  Negative for nosebleeds and sore throat.   Eyes:  Negative for double vision.  Respiratory:  Positive for cough. Negative for wheezing.   Cardiovascular:  Negative for chest pain, palpitations, orthopnea and leg swelling.  Gastrointestinal:  Positive for heartburn and nausea. Negative for abdominal pain, blood in stool, diarrhea and  melena.  Genitourinary:  Negative for dysuria, frequency and urgency.  Musculoskeletal:  Negative for back pain and joint pain.  Skin:  Negative for itching.  Neurological:  Positive for dizziness, weakness and headaches. Negative for tingling and focal weakness.  Endo/Heme/Allergies:  Does not bruise/bleed easily.  Psychiatric/Behavioral:  Negative for depression. The patient is not nervous/anxious and does not have insomnia.      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    SURGICAL HISTORY: Past Surgical History:  Procedure Laterality Date   FRACTURE SURGERY     broke femur when he was 6 years   PORTA CATH INSERTION N/A 03/28/2021   Procedure: PORTA CATH INSERTION;  Surgeon: Nathan Cabal, MD;  Location: [facility];  Service: Cardiovascular;  Laterality: N/A;   VIDEO BRONCHOSCOPY WITH ENDOBRONCHIAL NAVIGATION N/A 03/15/2021   Procedure: VIDEO BRONCHOSCOPY WITH ENDOBRONCHIAL NAVIGATION;  Surgeon: Nathan Glazier, MD;  Location: [facility];  Service: Thoracic;  Laterality: N/A;   VIDEO BRONCHOSCOPY WITH ENDOBRONCHIAL ULTRASOUND N/A 03/15/2021   Procedure: VIDEO BRONCHOSCOPY WITH ENDOBRONCHIAL ULTRASOUND;  Surgeon: Nathan Glazier, MD;  Location: [facility];  Service: Thoracic;  Laterality: N/A;    SOCIAL HISTORY: Social History   Socioeconomic History   Marital status: Married    Spouse name: [name]   Number of children: 2   Years of education: Not on file   Highest education level: Not on file  Occupational History   Not on file  Tobacco Use   Smoking status: Never   Smokeless tobacco: Current    Types: Snuff, Chew  Vaping Use   Vaping Use: Never used  Substance and Sexual Activity   Alcohol use: Never   Drug use: Never   Sexual activity: Not on file  Other Topics Concern   Not on file  Social History Narrative   [place] with wife; 2 daughters[12 and 22]; never smoked; rare alcohol. Work in saw [employer]    Social Determinants of Health   Financial Resource Strain: Low Risk  (05/10/2022)   Overall Financial Resource Strain (CARDIA)    Difficulty of Paying Living Expenses: Not hard at all  Food Insecurity: No Food Insecurity (05/10/2022)   Hunger Vital Sign    Worried About Running Out of Food in the Last Year: Never true    Ran Out of Food in the Last Year: Never true  Transportation Needs: No Transportation Needs (03/16/2022)   PRAPARE - Hydrologist (Medical): No    Lack of Transportation (Non-Medical): No  Physical Activity: Not on file  Stress: Not on file  Social Connections: Socially Integrated (05/10/2022)   Social Connection and Isolation Panel [NHANES]    Frequency of Communication with Friends and Family: More than three times a week    Frequency of Social Gatherings with Friends and Family: More than three times a week    Attends Religious Services: More than 4 times per year    Active Member of [employer] or Organizations: Yes    Attends Music therapist: More than 4 times per year    Marital Status: Married  Human resources officer Violence: Not on file    FAMILY HISTORY: Family History  Problem Relation Age of Onset   High Cholesterol Mother    Hypertension Father    Cancer Father  Lung cancer Father    Skin cancer Father     ALLERGIES:  is allergic to codeine.  MEDICATIONS:  Current Outpatient Medications  Medication Sig Dispense Refill   apixaban (ELIQUIS) 5 MG TABS tablet Take 1 tablet (5 mg total) by mouth 2 (two) times daily. 60 tablet 2   calcium carbonate (TUMS EX) 750 MG chewable tablet Chew 1 tablet by mouth daily.     folic acid (FOLVITE) 1 MG tablet Take 1 tablet (1 mg total) by mouth daily. 90 tablet 1   lidocaine-prilocaine (EMLA) cream Apply 1 application topically as needed. 30 g 0   metoprolol succinate (TOPROL-XL) 50 MG 24  hr tablet Take 50 mg by mouth daily. Take with or immediately following a meal.     sertraline (ZOLOFT) 50 MG tablet TAKE 1/2 TABLET BY MOUTH DAILY 30 tablet 2   TAGRISSO 80 MG tablet TAKE 1 TABLET DAILY 30 tablet 4   ondansetron (ZOFRAN) 8 MG tablet One pill every 8 hours as needed for nausea/vomitting. 40 tablet 1   prochlorperazine (COMPAZINE) 10 MG tablet Take 1 tablet (10 mg total) by mouth every 6 (six) hours as needed for nausea or vomiting. 40 tablet 1   No current facility-administered medications for this visit.   Facility-Administered Medications Ordered in Other Visits  Medication Dose Route Frequency Provider Last Rate Last Admin   heparin lock flush 100 UNIT/ML injection            heparin lock flush 100 UNIT/ML injection            sodium chloride flush (NS) 0.9 % injection 10 mL  10 mL Intravenous PRN Nathan Sickle, MD   10 mL at 07/28/21 0852      .  PHYSICAL EXAMINATION: ECOG PERFORMANCE STATUS: 1 - Symptomatic but completely ambulatory  Vitals:   08/10/22 1027  BP: 120/79  Pulse: 73  Resp: 17  Temp: 98.8 F (37.1 C)  SpO2: 97%     Filed Weights   08/10/22 1027  Weight: 230 lb (104.3 kg)       Physical Exam HENT:     Head: Normocephalic and atraumatic.     Mouth/Throat:     Pharynx: No oropharyngeal exudate.  Eyes:     Pupils: Pupils are equal, round, and reactive to light.  Cardiovascular:     Rate and Rhythm: Normal rate and regular rhythm.  Pulmonary:     Effort: No respiratory distress.     Breath sounds: No wheezing.     Comments: Decreased breath sound on the left side compared to right. Abdominal:     General: Bowel sounds are normal. There is no distension.     Palpations: Abdomen is soft. There is no mass.     Tenderness: There is no abdominal tenderness. There is no guarding or rebound.  Musculoskeletal:        General: No tenderness. Normal range of motion.     Cervical back: Normal range of motion and neck supple.   Skin:    General: Skin is warm.  Neurological:     Mental Status: He is alert and oriented to person, place, and time.  Psychiatric:        Mood and Affect: Affect normal.    Fungal dermatitis bilateral buttocks groin/underneath abdominal pannus  LABORATORY DATA:  I have reviewed the data as listed Lab Results  Component Value Date   WBC 6.0 08/10/2022   HGB 14.4 08/10/2022   HCT 44.2 08/10/2022  MCV 87.7 08/10/2022   PLT 213 08/10/2022   Recent Labs    06/04/22 1501 07/18/22 1526 08/10/22 1124  NA 136 136 134*  K 3.7 3.7 3.8  CL 107 106 101  CO2 _0 GLUCOSE 86 88 94  BUN _1 CREATININE 1.10 1.17 1.21  CALCIUM 8.2* 8.0* 8.5*  GFRNONAA >60 >60 >60  PROT 7.3 7.3 7.6  ALBUMIN 3.7 3.6 3.6  AST 36 38 100*  ALT 52* 42 110*  ALKPHOS 123 162* 266*  BILITOT 0.5 0.6 0.8    RADIOGRAPHIC STUDIES: I have personally reviewed the radiological images as listed and agreed with the findings in the report. US THORACENTESIS ASP PLEURAL SPACE W/IMG GUIDE  Result Date: 07/26/2022 INDICATION: Patient with history of left lower lung cancer, recurrent left pleural effusion. Request is made for diagnostic and therapeutic thoracentesis. EXAM: ULTRASOUND GUIDED DIAGNOSTIC THORACENTESIS MEDICATIONS: 15 mL 1% lidocaine COMPLICATIONS: None immediate. PROCEDURE: An ultrasound guided thoracentesis was thoroughly discussed with the patient and questions answered. The benefits, risks, alternatives and complications were also discussed. The patient understands and wishes to proceed with the procedure. Written consent was obtained. Ultrasound was performed to localize and mark an adequate pocket of fluid in the left chest. Fluid appeared thick and complex by ultrasound. The area was then prepped and draped in the normal sterile fashion. 1% Lidocaine was used for local anesthesia. Under ultrasound guidance a 6 Fr Safe-T-Centesis catheter was first introduced. Despite confirmation by imaging of  being within the fluid collection a minimal amount of fluid was able to be aspirated. Dr. Anselm Pancoast to bedside and was able to access fluid pocket with a 7 cm Yueh. Thoracentesis was performed. The catheter was removed and a dressing applied. FINDINGS: A total of approximately 200 mL of dark, bloody fluid was removed. Samples were sent to the laboratory as requested by the clinical team. IMPRESSION: Successful ultrasound guided left thoracentesis yielding 200 mL of pleural fluid. Read by: Brynda Greathouse PA-C Electronically Signed   By: Markus Daft M.D.   On: 07/26/2022 15:06   DG Chest Port 1 View  Result Date: 07/26/2022 CLINICAL DATA:  Status post left thoracentesis EXAM: PORTABLE CHEST 1 VIEW COMPARISON:  04/18/2022.  CT 07/16/2022. FINDINGS: Large loculated left pleural effusion is again noted. No pneumothorax following thoracentesis. Airspace disease in the left lung likely reflects atelectasis. Right lung clear. Heart is normal size. No acute bony abnormality. IMPRESSION: Continued loculated large left pleural effusion.  No pneumothorax. Electronically Signed   By: Rolm Baptise M.D.   On: 07/26/2022 14:23   CT CHEST W CONTRAST  Result Date: 07/18/2022 CLINICAL DATA:  41 year old male with history of non-small cell lung cancer. Evaluate for treatment response. * Tracking Code: BO * EXAM: CT CHEST WITH CONTRAST TECHNIQUE: Multidetector CT imaging of the chest was performed during intravenous contrast administration. RADIATION DOSE REDUCTION: This exam was performed according to the departmental dose-optimization program which includes automated exposure control, adjustment of the mA and/or kV according to patient size and/or use of iterative reconstruction technique. CONTRAST:  14m OMNIPAQUE IOHEXOL 300 MG/ML  SOLN COMPARISON:  PET-CT 01/31/2022. CT of the chest, abdomen and pelvis 12/22/2021. FINDINGS: Cardiovascular: Heart size is normal. There is no significant pericardial fluid, thickening or pericardial  calcification. No atherosclerotic calcifications are noted in the thoracic aorta or the coronary arteries. Right internal jugular single-lumen Port-A-Cath with tip terminating at the superior cavoatrial junction. Mediastinum/Nodes: No pathologically enlarged mediastinal or hilar lymph nodes. Esophagus  is unremarkable in appearance. No axillary lymphadenopathy. Lungs/Pleura: There is again a chronic mass-like area of architectural distortion and substantial volume loss in the left lower lobe at the site of the treated neoplasm, unchanged compared to prior examinations, presumably reflective of chronic postradiation mass-like fibrosis. Substantial volume loss and architectural distortion is also noted in the left upper lobe. Large chronic thick-walled left pleural effusion with complex internal architecture, including what appear to be several internal septations and variable degrees of attenuation. This effusion has increased slightly in size compared to the prior examination. No definite focal mass confidently identified within this left-sided pleural fluid collection. Previously noted right middle lobe ground-glass attenuation nodule has resolved, now with a 4 mm subpleural nodule in its wake (axial image 99 of series 3) abutting the major fissure, nonspecific, but statistically likely benign. No other definite suspicious appearing pulmonary nodules or masses are noted. 6 mm ground-glass attenuation nodule in the right lower lobe (axial image 99 of series 3), new compared to the prior examination, but nonspecific. Upper Abdomen: Unremarkable. Musculoskeletal: There are no aggressive appearing lytic or blastic lesions noted in the visualized portions of the skeleton. IMPRESSION: 1. Enlarging large complex thick walled left-sided pleural fluid collection. Given the hypermetabolism noted on the prior PET-CT, residual pleural disease is suspected, and sampling of the pleural fluid should be considered if clinically  appropriate at this time. 2. Stable appearance of chronic postradiation mass-like fibrosis in the left lower lobe. 3. Small nonspecific right-sided pulmonary nodules, as above, likely benign. Attention on routine follow-up imaging is recommended. Electronically Signed   By: Vinnie Langton M.D.   On: 07/18/2022 09:02    ASSESSMENT & PLAN:   Cancer of lower lobe of left lung ([place]) # STAGE IV- Left lower lobe lung cancer-adenocarcinoma the lung;  EGFR- 19 del POSITIVE. Currently on osimertinib 80 mg a day [osimertinib [since June 28th, 2022].  OCT 2nd, 2023- CT scan- Enlarging large complex thick walled left-sided pleural fluid collection. Given the hypermetabolism noted on the prior PET-CT, residual pleural disease is suspected, and sampling of the pleural fluid should be considered if clinically appropriate at this time.  Stable appearance of chronic postradiation mass-like fibrosis in lhe left lower lobe.  No worsening axillary adenopathy noted.   #Worsening symptoms of vague chest discomfort ; shortness of breath on exertion poor appetite I am concerned for progressive local disease [see below re: CNS progression]-based on exudative fluid; elevated LDH.  However negative on cytology. Will discuss with pulmonary re: Biopsy planning. Order liquid Biopsy/Guardant testing.  We will get a PET scan ASAP; for consideration of repeat biopsy.   #Recommend chemo-immunotherapy-carbo+ Taxol plus Avastin plus Atezo.  Hopefully plan next week.  # #  I reviewed at length the individual components with chemotherapy; and the schedule in detail.  I also discussed the potential side effects including but not limited to-increasing fatigue, nausea vomiting, diarrhea, hair loss, sores in the mouth, increase risk of infection and also neuropathy.  Also reviewed the multiple strategies to avoid/mitigate similar side effects including preemptive medications-for nausea vomiting.  Sent new prescription for antiemetics.   I  discussed the mechanism of action; The goal of therapy is palliative; and length of treatments are likely ongoing/based upon the results of the scans. Discussed the potential side effects of immunotherapy including but not limited to diarrhea; skin rash; elevated LFTs/endocrine abnormalities etc.  #Asymptomatic multiple brain mets subcentimeter asymptomatic -JUNE 29th, 2023- Numerous metastatic lesions in the supratentorial brain-increase in number also  conspicuity compared to January 2023.  Pre-SBRT MRI brain AUG 18th, 2023-  Eighteen small enhancing brain metastases (punctate to 11 mm); were all present on 03/28/2021-s/p SBRT SEP 2023- [[place]].  Likely to be causing any worsening headaches.  Await MRI brain as ordered in December.  If headaches get worse would recommend imaging sooner.  #Left lung PE [incidental on CT SEP 13th,2022-]?Symptomatic DVT of the right calf-on Eliquis-STABLE.  #Bone metastases- CT DEc 7th, 2022 to healing process.  Dental evaluation on 9/21.  Continue calcium vitamin D intake. calcium 8.1; ?  Osimertinib. HOLD zometa.   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  Will refill- STABLE.  # Continued hypocalcemia- ?  Symptomatic cramps.  Vitamin D levels normal.  CK420-June; July 2023 with repeat CK-within normal limits.  Monitor for now  # IV access/poor access-: port flush.   # DISPOSITION:  # labs today- cmc;CMP;LDH; CEA; Guardant testing re: Lung cancer  # PET ASAP # follow up TBD- Dr.B   # 40 minutes face-to-face with the patient discussing the above plan of care; more than 50% of time spent on prognosis/ natural history; counseling and coordination.                All questions were answered. The patient knows to call the clinic with any problems, questions or concerns.    Nathan Sickle, MD 08/10/2022 8:26 PM

## 2022-08-10 NOTE — Telephone Encounter (Signed)
Test was sent.

## 2022-08-10 NOTE — Assessment & Plan Note (Addendum)
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 Currently on osimertinib 80 mg a day [osimertinib [since June 28th, 2022].  OCT 2nd, 2023- CT scan- Enlarging large complex thick walled left-sided pleural fluid collection. Given the hypermetabolism noted on the prior PET-CT, residual pleural disease is suspected, and sampling of the pleural fluid should be considered if clinically appropriate at this time.  Stable appearance of chronic postradiation mass-like fibrosis in lhe left lower lobe.  No worsening axillary adenopathy noted.   #Worsening symptoms of vague chest discomfort ; shortness of breath on exertion poor appetite I am concerned for progressive local disease [see below re: CNS progression]-based on exudative fluid; elevated LDH.  However negative on cytology. Will discuss with pulmonary re: Biopsy planning. Order liquid Biopsy/Guardant testing.  We will get a PET scan ASAP; for consideration of repeat biopsy.  We will also discuss imaging at tumor conference.  #Recommend chemo-immunotherapy-carbo+ Taxol plus Avastin plus Atezo.  Hopefully plan next week.  # #  I reviewed at length the individual components with chemotherapy; and the schedule in detail.  I also discussed the potential side effects including but not limited to-increasing fatigue, nausea vomiting, diarrhea, hair loss, sores in the mouth, increase risk of infection and also neuropathy.  Also reviewed the multiple strategies to avoid/mitigate similar side effects including preemptive medications-for nausea vomiting.  Sent new prescription for antiemetics.   I discussed the mechanism of action; The goal of therapy is palliative; and length of treatments are likely ongoing/based upon the results of the scans. Discussed the potential side effects of immunotherapy including but not limited to diarrhea; skin rash; elevated LFTs/endocrine abnormalities etc.  #Asymptomatic multiple brain mets  subcentimeter asymptomatic -JUNE 29th, 2023- Numerous metastatic lesions in the supratentorial brain-increase in number also conspicuity compared to January 2023.  Pre-SBRT MRI brain AUG 18th, 2023-  Eighteen small enhancing brain metastases (punctate to 11 mm); were all present on 03/28/2021-s/p SBRT SEP 2023- [[place]].  Likely to be causing any worsening headaches.  Await MRI brain as ordered in December.  If headaches get worse would recommend imaging sooner.  #Left lung PE [incidental on CT SEP 13th,2022-]?Symptomatic DVT of the right calf-on Eliquis-STABLE.  #Bone metastases- CT DEc 7th, 2022 to healing process.  Dental evaluation on 9/21.  Continue calcium vitamin D intake. calcium 8.1; ?  Osimertinib. HOLD zometa.   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  Will refill- STABLE.  # Continued hypocalcemia- ?  Symptomatic cramps.  Vitamin D levels normal.  CK420-June; July 2023 with repeat CK-within normal limits.  Monitor for now  # IV access/poor access-: port flush.   # DISPOSITION:  # labs today- cmc;CMP;LDH; CEA; Guardant testing re: Lung cancer  # PET ASAP # follow up TBD- Dr.B   # 40 minutes face-to-face with the patient discussing the above plan of care; more than 50% of time spent on prognosis/ natural history; counseling and coordination.    Addendum: LFTs AST ALT 100 range; elevated alkaline phosphatase normal bilirubin-question related to [place].  Elevated LDH.  Will order hepatitis panel; TSH at next visit. Recommend HOLDING [place].  Discussed with the patient; and his wife.  They are in agreement.  GB

## 2022-08-10 NOTE — Progress Notes (Signed)
DISCONTINUE ON PATHWAY REGIMEN - Non-Small Cell Lung     A cycle is every 21 days:     Pemetrexed      Carboplatin   **Always confirm dose/schedule in your pharmacy ordering system**  REASON: Change In Patient Status PRIOR TREATMENT: LOS400: Carboplatin AUC=5 + Pemetrexed 500 mg/m2 q21 Days x 1 Cycle  START ON PATHWAY REGIMEN - Non-Small Cell Lung     A cycle is every 21 days:     Atezolizumab      Bevacizumab-xxxx      Paclitaxel      Carboplatin   **Always confirm dose/schedule in your pharmacy ordering system**  Patient Characteristics: Stage IV Metastatic, Nonsquamous, Molecular Analysis Completed, Molecular Alteration Present and Targeted Therapy Exhausted OR EGFR Exon 20+ or KRAS G12C+ or HER2+ Present and No Prior Chemo/Immunotherapy OR No Alteration Present, Initial  Chemotherapy/Immunotherapy, PS = 0, 1, EGFR Mutation Positive Therapeutic Status: Stage IV Metastatic Histology: Nonsquamous Cell Broad Molecular Profiling Status: Molecular Analysis Completed Molecular Analysis Results: Alteration Present and Targeted Therapy Exhausted ECOG Performance Status: 1 Chemotherapy/Immunotherapy Line of Therapy: Initial Chemotherapy/Immunotherapy Intent of Therapy: Non-Curative / Palliative Intent, Discussed with Patient

## 2022-08-12 LAB — CEA: CEA: 9.5 ng/mL — ABNORMAL HIGH (ref 0.0–4.7)

## 2022-08-13 ENCOUNTER — Other Ambulatory Visit: Payer: Self-pay | Admitting: *Deleted

## 2022-08-13 ENCOUNTER — Telehealth: Payer: Self-pay | Admitting: *Deleted

## 2022-08-13 ENCOUNTER — Encounter (INDEPENDENT_AMBULATORY_CARE_PROVIDER_SITE_OTHER): Payer: Self-pay

## 2022-08-13 DIAGNOSIS — C3432 Malignant neoplasm of lower lobe, left bronchus or lung: Secondary | ICD-10-CM

## 2022-08-13 NOTE — Telephone Encounter (Signed)
Received message from pt's wife that pt has been progressively getting weaker over the weekend and not eating/drinking very well. States that pt had to leave work early today and pt's wife is concerned. Per Merrily Pew, will schedule pt in [facility] tomorrow to recheck labs and evaluate if needs possible IV fluids. Appt scheduled and pt made aware.

## 2022-08-14 ENCOUNTER — Inpatient Hospital Stay: Payer: Managed Care, Other (non HMO)

## 2022-08-14 ENCOUNTER — Ambulatory Visit
Admission: RE | Admit: 2022-08-14 | Discharge: 2022-08-14 | Disposition: A | Discharge status: Home / Self Care | Payer: Managed Care, Other (non HMO) | Source: Ambulatory Visit | Attending: Hospice and Palliative Medicine | Admitting: Hospice and Palliative Medicine

## 2022-08-14 ENCOUNTER — Other Ambulatory Visit: Payer: Self-pay | Admitting: Internal Medicine

## 2022-08-14 ENCOUNTER — Encounter: Payer: Self-pay | Admitting: Hospice and Palliative Medicine

## 2022-08-14 ENCOUNTER — Other Ambulatory Visit: Payer: Self-pay

## 2022-08-14 ENCOUNTER — Inpatient Hospital Stay (HOSPITAL_BASED_OUTPATIENT_CLINIC_OR_DEPARTMENT_OTHER): Payer: Managed Care, Other (non HMO) | Admitting: Hospice and Palliative Medicine

## 2022-08-14 VITALS — BP 127/79 | HR 65 | Temp 97.9°F | Resp 18 | Ht 70.0 in | Wt 227.9 lb

## 2022-08-14 DIAGNOSIS — C7931 Secondary malignant neoplasm of brain: Secondary | ICD-10-CM

## 2022-08-14 DIAGNOSIS — C3432 Malignant neoplasm of lower lobe, left bronchus or lung: Secondary | ICD-10-CM

## 2022-08-14 DIAGNOSIS — E860 Dehydration: Secondary | ICD-10-CM

## 2022-08-14 DIAGNOSIS — R7989 Other specified abnormal findings of blood chemistry: Secondary | ICD-10-CM

## 2022-08-14 DIAGNOSIS — Z95828 Presence of other vascular implants and grafts: Secondary | ICD-10-CM

## 2022-08-14 DIAGNOSIS — R110 Nausea: Secondary | ICD-10-CM

## 2022-08-14 DIAGNOSIS — R197 Diarrhea, unspecified: Secondary | ICD-10-CM

## 2022-08-14 DIAGNOSIS — C7951 Secondary malignant neoplasm of bone: Secondary | ICD-10-CM

## 2022-08-14 DIAGNOSIS — R112 Nausea with vomiting, unspecified: Secondary | ICD-10-CM

## 2022-08-14 LAB — CBC WITH DIFFERENTIAL/PLATELET
Abs Immature Granulocytes: 0.07 10*3/uL (ref 0.00–0.07)
Basophils Absolute: 0 10*3/uL (ref 0.0–0.1)
Basophils Relative: 1 %
Eosinophils Absolute: 0.2 10*3/uL (ref 0.0–0.5)
Eosinophils Relative: 2 %
HCT: 46.7 % (ref 39.0–52.0)
Hemoglobin: 15.1 g/dL (ref 13.0–17.0)
Immature Granulocytes: 1 %
Lymphocytes Relative: 12 %
Lymphs Abs: 0.9 10*3/uL (ref 0.7–4.0)
MCH: 28.3 pg (ref 26.0–34.0)
MCHC: 32.3 g/dL (ref 30.0–36.0)
MCV: 87.6 fL (ref 80.0–100.0)
Monocytes Absolute: 0.8 10*3/uL (ref 0.1–1.0)
Monocytes Relative: 11 %
Neutro Abs: 5.5 10*3/uL (ref 1.7–7.7)
Neutrophils Relative %: 73 %
Platelets: 208 10*3/uL (ref 150–400)
RBC: 5.33 MIL/uL (ref 4.22–5.81)
RDW: 13.4 % (ref 11.5–15.5)
WBC: 7.5 10*3/uL (ref 4.0–10.5)
nRBC: 0 % (ref 0.0–0.2)

## 2022-08-14 LAB — COMPREHENSIVE METABOLIC PANEL
ALT: 148 U/L — ABNORMAL HIGH (ref 0–44)
AST: 143 U/L — ABNORMAL HIGH (ref 15–41)
Albumin: 3.7 g/dL (ref 3.5–5.0)
Alkaline Phosphatase: 334 U/L — ABNORMAL HIGH (ref 38–126)
Anion gap: 6 (ref 5–15)
BUN: 11 mg/dL (ref 6–20)
CO2: 28 mmol/L (ref 22–32)
Calcium: 8.6 mg/dL — ABNORMAL LOW (ref 8.9–10.3)
Chloride: 101 mmol/L (ref 98–111)
Creatinine, Ser: 1.22 mg/dL (ref 0.61–1.24)
GFR, Estimated: >60 mL/min (ref >60)
Glucose, Bld: 84 mg/dL (ref 70–99)
Potassium: 3.9 mmol/L (ref 3.5–5.1)
Sodium: 135 mmol/L (ref 135–145)
Total Bilirubin: 0.6 mg/dL (ref 0.3–1.2)
Total Protein: 7.7 g/dL (ref 6.5–8.1)

## 2022-08-14 LAB — TSH: TSH: 1.957 u[IU]/mL (ref 0.350–4.500)

## 2022-08-14 LAB — HEPATITIS PANEL, ACUTE
HCV Ab: NONREACTIVE
Hep A IgM: NONREACTIVE
Hep B C IgM: NONREACTIVE
Hepatitis B Surface Ag: NONREACTIVE

## 2022-08-14 LAB — MAGNESIUM: Magnesium: 2.2 mg/dL (ref 1.7–2.4)

## 2022-08-14 MED ORDER — OMEPRAZOLE 20 MG PO CPDR: 20.0000 mg | DELAYED_RELEASE_CAPSULE | Freq: Every day | ORAL | 2 refills | Status: DC

## 2022-08-14 MED ORDER — DRONABINOL 2.5 MG PO CAPS: 2.5000 mg | ORAL_CAPSULE | Freq: Two times a day (BID) | ORAL | 0 refills | Status: DC

## 2022-08-14 MED ORDER — ONDANSETRON HCL 4 MG/2ML IJ SOLN
8.0000 mg | Freq: Once | INTRAMUSCULAR | Status: AC
  Administered 2022-08-14: 8 mg via INTRAVENOUS
  Filled 2022-08-14: qty 4

## 2022-08-14 MED ORDER — SODIUM CHLORIDE 0.9% FLUSH
10.0000 mL | Freq: Once | INTRAVENOUS | Status: AC
  Administered 2022-08-14: 10 mL via INTRAVENOUS
  Filled 2022-08-14: qty 10

## 2022-08-14 MED ORDER — GADOBUTROL 1 MMOL/ML IV SOLN
10.0000 mL | Freq: Once | INTRAVENOUS | Status: AC | PRN
  Administered 2022-08-14: 10 mL via INTRAVENOUS

## 2022-08-14 MED ORDER — SODIUM CHLORIDE 0.9 % IV SOLN
INTRAVENOUS | Status: DC
  Filled 2022-08-14 (×2): qty 250

## 2022-08-14 MED ORDER — HEPARIN SOD (PORK) LOCK FLUSH 100 UNIT/ML IV SOLN
500.0000 [IU] | Freq: Once | INTRAVENOUS | Status: AC
  Administered 2022-08-14: 500 [IU]
  Filled 2022-08-14: qty 5

## 2022-08-14 NOTE — Progress Notes (Signed)
Symptom Management [place] at [facility] [phone] [phone]  Patient Care Team: Pcp, No as PCP - General [name], RN as Oncology Nurse Navigator [name], MD as Consulting Physician (Internal Medicine)   NAME OF PATIENT: [name]  [id number]  April 03, 1981   DATE OF VISIT: 08/14/22  REASON FOR CONSULT: [name] is a 41 y.o. 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  Most recently on treatment with osimertinib.  INTERVAL HISTORY: Patient last saw Dr. Rogue Bussing on 08/10/2022 at which time he had worsening symptoms of chest discomfort and shortness of breath and poor appetite.  There was concern for progressive local disease and possible CNS progression.  Plan was to rotate to chemoimmunotherapy with carbo/Taxol plus Avastin and atezo.   Patient was scheduled for [facility] visit today due to progressive decline with increasing weakness and poor oral intake.  Patient reports several days of nausea and intermittent vomiting.  He kept down some chicken noodle soup last night but vomited after eating a bacon biscuit this morning.  He is also had some loose stools.  Patient has had some mild epigastric burning but denies overt pain.  He has been taking Tums for that.  No fever or chills.  He denies dizziness or active headaches.  He did have a headache a couple weeks ago which is unusual for him.  No visual changes or focal weakness.   Denies recent fevers or illnesses. Denies any easy bleeding or bruising. Denies chest pain. Denies urinary complaints. Patient offers no further specific complaints today.   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   PORTA CATH INSERTION N/A 03/28/2021   Procedure: PORTA CATH INSERTION;  Surgeon: Katha Cabal, MD;  Location: [place] [facility];  Service: Cardiovascular;  Laterality: N/A;   VIDEO BRONCHOSCOPY WITH ENDOBRONCHIAL NAVIGATION N/A 03/15/2021   Procedure: VIDEO BRONCHOSCOPY WITH ENDOBRONCHIAL NAVIGATION;  Surgeon: Ottie Glazier, MD;  Location: [facility] OR[facility];  Service: Thoracic;  Laterality: N/A;   VIDEO BRONCHOSCOPY WITH ENDOBRONCHIAL ULTRASOUND N/A 03/15/2021   Procedure: VIDEO BRONCHOSCOPY WITH ENDOBRONCHIAL ULTRASOUND;  Surgeon: Ottie Glazier, MD;  Location: [facility] OR[facility];  Service: Thoracic;  Laterality: N/A;    HEMATOLOGY/ONCOLOGY HISTORY:  Oncology History Overview Note  IMPRESSION: 1. Patchy nodular fat stranding throughout the anterior left upper quadrant peritoneal fat, nonspecific, cannot exclude peritoneal carcinomatosis. Dedicated CT abdomen/pelvis with oral and IV contrast recommended for further evaluation. 2. Dense patchy consolidation replacing much of the left lower lung lobe, appearing masslike in the superior segment left lower lobe, with associated bulging of the left major fissure and associated left lower lobe volume loss. Fine nodularity throughout both lungs with an upper lobe predominance. Asymmetric left upper lobe interlobular septal thickening. These findings are indeterminate, with differential including multilobar pneumonia, sarcoidosis or a neoplastic process. The persistence on radiographs back to 01/20/2021 despite antibiotic therapy make sarcoidosis or a neoplastic process more likely. Pulmonology consultation suggested for consideration of bronchoscopic evaluation. 3. Small dependent left pleural effusion. 4. Mild mediastinal lymphadenopathy, nonspecific. 5. Subacute healing lateral right sixth rib fracture.  DIAGNOSIS:  A. LUNG, LEFT LOWER LOBE; ENB-ASSISTED BIOPSY:   - NON-SMALL CELL CARCINOMA, FAVOR ADENOCARCINOMA.  - FOREIGN MATERIAL SUGGESTIVE OF ASPIRATION.   # EGFR MUTATED: 30 del- June 28th, 2022- [name]. [limited]  # Asymptomatic multiple brain mets subcentimeter asymptomatic -JUNE 29th, 2023- Numerous metastatic lesions in the supratentorial brain-increase in number also conspicuity compared to January 2023.  Pre-SBRT MRI brain AUG 18th, 2023-  Eighteen small enhancing brain metastases (punctate to 11 mm); were all present on 03/28/2021-s/p SBRT SEP 2023- [[place]]  # SEP-OCT-worsening left-sided pleural effusion-status post paracentesis; exudative; Cytology negative- ?  Progressive disease..   Cancer of lower lobe of left lung ([place])  03/23/2021 Initial Diagnosis   Cancer of lower lobe of left lung ([place])   03/29/2021 Cancer Staging   Staging form: Lung, AJCC 8th Edition - Clinical: Stage IVB (cT3, cN3, pM1c) - Signed by [name], MD on 03/29/2021   03/30/2021 - 03/30/2021 Chemotherapy   Patient is on Treatment Plan : LUNG NSCLC Pemetrexed ([place]) / Carboplatin q21d x 1 cycles     08/17/2022 -  Chemotherapy   Patient is on Treatment Plan : LUNG Atezolizumab + Bevacizumab + Carboplatin + Paclitaxel q21d Induction x 4 cycles / Atezolizumab + Bevacizumab q21d Maintenance       ALLERGIES:  is allergic to codeine.  MEDICATIONS:  Current Outpatient Medications  Medication Sig Dispense Refill   apixaban (ELIQUIS) 5 MG TABS tablet Take 1 tablet (5 mg total) by mouth 2 (two) times daily. 60 tablet 2   calcium carbonate (TUMS EX) 750 MG chewable tablet Chew 1 tablet by mouth daily.     folic acid (FOLVITE) 1 MG tablet Take 1 tablet (1 mg total) by mouth daily. 90 tablet 1   lidocaine-prilocaine (EMLA) cream Apply 1 application topically as needed. 30 g 0   metoprolol succinate (TOPROL-XL) 50 MG 24 hr tablet Take 50 mg by mouth daily. Take with or immediately following a meal.     ondansetron (ZOFRAN) 8 MG tablet One pill every 8 hours as  needed for nausea/vomitting. 40 tablet 1   prochlorperazine (COMPAZINE) 10 MG tablet Take 1 tablet (10 mg total) by mouth every 6 (six) hours as needed for nausea or vomiting. 40 tablet 1   sertraline (ZOLOFT) 50 MG tablet TAKE 1/2 TABLET BY MOUTH DAILY 30 tablet 2   TAGRISSO 80 MG tablet TAKE 1 TABLET DAILY 30 tablet 4   No current facility-administered medications for this visit.   Facility-Administered Medications Ordered in Other Visits  Medication Dose Route Frequency Provider Last Rate Last Admin   heparin lock flush 100 UNIT/ML injection            heparin lock flush 100 UNIT/ML injection            sodium chloride flush (NS) 0.9 % injection 10 mL  10 mL Intravenous PRN [name], MD   10 mL at 07/28/21 0852    VITAL SIGNS: There were no vitals taken for this visit. There were no vitals filed for this visit.  Estimated body mass index is 33 kg/m as calculated from the following:   Height as of 07/13/22: 5' 10" (1.778 m).   Weight as of 08/10/22: 230 lb (104.3 kg).  LABS: CBC:    Component Value Date/Time   WBC 6.0 08/10/2022 1124   HGB 14.4 08/10/2022 1124   HCT 44.2 08/10/2022 1124   PLT 213 08/10/2022 1124   MCV 87.7 08/10/2022 1124   NEUTROABS 4.0  08/10/2022 1124   LYMPHSABS 0.9 08/10/2022 1124   MONOABS 0.9 08/10/2022 1124   EOSABS 0.2 08/10/2022 1124   BASOSABS 0.0 08/10/2022 1124   Comprehensive Metabolic Panel:    Component Value Date/Time   NA 134 (L) 08/10/2022 1124   K 3.8 08/10/2022 1124   CL 101 08/10/2022 1124   CO2 26 08/10/2022 1124   BUN 11 08/10/2022 1124   CREATININE 1.21 08/10/2022 1124   GLUCOSE 94 08/10/2022 1124   CALCIUM 8.5 (L) 08/10/2022 1124   AST 100 (H) 08/10/2022 1124   ALT 110 (H) 08/10/2022 1124   ALKPHOS 266 (H) 08/10/2022 1124   BILITOT 0.8 08/10/2022 1124   PROT 7.6 08/10/2022 1124   ALBUMIN 3.6 08/10/2022 1124    RADIOGRAPHIC STUDIES: US THORACENTESIS ASP PLEURAL SPACE W/IMG GUIDE  Result Date:  07/26/2022 INDICATION: Patient with history of left lower lung cancer, recurrent left pleural effusion. Request is made for diagnostic and therapeutic thoracentesis. EXAM: ULTRASOUND GUIDED DIAGNOSTIC THORACENTESIS MEDICATIONS: 15 mL 1% lidocaine COMPLICATIONS: None immediate. PROCEDURE: An ultrasound guided thoracentesis was thoroughly discussed with the patient and questions answered. The benefits, risks, alternatives and complications were also discussed. The patient understands and wishes to proceed with the procedure. Written consent was obtained. Ultrasound was performed to localize and mark an adequate pocket of fluid in the left chest. Fluid appeared thick and complex by ultrasound. The area was then prepped and draped in the normal sterile fashion. 1% Lidocaine was used for local anesthesia. Under ultrasound guidance a 6 Fr Safe-T-Centesis catheter was first introduced. Despite confirmation by imaging of being within the fluid collection a minimal amount of fluid was able to be aspirated. Dr. Anselm Pancoast to bedside and was able to access fluid pocket with a 7 cm Yueh. Thoracentesis was performed. The catheter was removed and a dressing applied. FINDINGS: A total of approximately 200 mL of dark, bloody fluid was removed. Samples were sent to the laboratory as requested by the clinical team. IMPRESSION: Successful ultrasound guided left thoracentesis yielding 200 mL of pleural fluid. Read by: Brynda Greathouse PA-C Electronically Signed   By: Markus Daft M.D.   On: 07/26/2022 15:06   DG Chest Port 1 View  Result Date: 07/26/2022 CLINICAL DATA:  Status post left thoracentesis EXAM: PORTABLE CHEST 1 VIEW COMPARISON:  04/18/2022.  CT 07/16/2022. FINDINGS: Large loculated left pleural effusion is again noted. No pneumothorax following thoracentesis. Airspace disease in the left lung likely reflects atelectasis. Right lung clear. Heart is normal size. No acute bony abnormality. IMPRESSION: Continued loculated large  left pleural effusion.  No pneumothorax. Electronically Signed   By: Rolm Baptise M.D.   On: 07/26/2022 14:23   CT CHEST W CONTRAST  Result Date: 07/18/2022 CLINICAL DATA:  41 year old male with history of non-small cell lung cancer. Evaluate for treatment response. * Tracking Code: BO * EXAM: CT CHEST WITH CONTRAST TECHNIQUE: Multidetector CT imaging of the chest was performed during intravenous contrast administration. RADIATION DOSE REDUCTION: This exam was performed according to the departmental dose-optimization program which includes automated exposure control, adjustment of the mA and/or kV according to patient size and/or use of iterative reconstruction technique. CONTRAST:  7m OMNIPAQUE IOHEXOL 300 MG/ML  SOLN COMPARISON:  PET-CT 01/31/2022. CT of the chest, abdomen and pelvis 12/22/2021. FINDINGS: Cardiovascular: Heart size is normal. There is no significant pericardial fluid, thickening or pericardial calcification. No atherosclerotic calcifications are noted in the thoracic aorta or the coronary arteries. Right internal jugular single-lumen Port-A-Cath with tip terminating at the  superior cavoatrial junction. Mediastinum/Nodes: No pathologically enlarged mediastinal or hilar lymph nodes. Esophagus is unremarkable in appearance. No axillary lymphadenopathy. Lungs/Pleura: There is again a chronic mass-like area of architectural distortion and substantial volume loss in the left lower lobe at the site of the treated neoplasm, unchanged compared to prior examinations, presumably reflective of chronic postradiation mass-like fibrosis. Substantial volume loss and architectural distortion is also noted in the left upper lobe. Large chronic thick-walled left pleural effusion with complex internal architecture, including what appear to be several internal septations and variable degrees of attenuation. This effusion has increased slightly in size compared to the prior examination. No definite focal mass  confidently identified within this left-sided pleural fluid collection. Previously noted right middle lobe ground-glass attenuation nodule has resolved, now with a 4 mm subpleural nodule in its wake (axial image 99 of series 3) abutting the major fissure, nonspecific, but statistically likely benign. No other definite suspicious appearing pulmonary nodules or masses are noted. 6 mm ground-glass attenuation nodule in the right lower lobe (axial image 99 of series 3), new compared to the prior examination, but nonspecific. Upper Abdomen: Unremarkable. Musculoskeletal: There are no aggressive appearing lytic or blastic lesions noted in the visualized portions of the skeleton. IMPRESSION: 1. Enlarging large complex thick walled left-sided pleural fluid collection. Given the hypermetabolism noted on the prior PET-CT, residual pleural disease is suspected, and sampling of the pleural fluid should be considered if clinically appropriate at this time. 2. Stable appearance of chronic postradiation mass-like fibrosis in the left lower lobe. 3. Small nonspecific right-sided pulmonary nodules, as above, likely benign. Attention on routine follow-up imaging is recommended. Electronically Signed   By: Vinnie Langton M.D.   On: 07/18/2022 09:02    PERFORMANCE STATUS (ECOG) : 1 - Symptomatic but completely ambulatory  Review of Systems Unless otherwise noted, a complete review of systems is negative.  Physical Exam General: NAD Cardiovascular: regular rate and rhythm Pulmonary: clear ant fields Abdomen: soft, nontender, + bowel sounds GU: no suprapubic tenderness Extremities: no edema, no joint deformities Skin: no rashes Neurological: Weakness but otherwise nonfocal  IMPRESSION/PLAN: Nausea/vomiting-patient pending PET scan tomorrow with concern for disease progression.  We will add on MRI of the brain.  We will proceed with IV fluids today and antiemetics.  Patient is not taking antiemetics currently and  recommended that he restart those in addition to ongoing symptomatic/supportive care.  We will start omeprazole daily for GERD symptoms.  Rising transaminases -most concerning for possible hepatic involvement with progressive cancer.  Again, patient is pending PET scan tomorrow for further evaluation.  Poor oral intake -we will start appetite stimulant with Marinol, which may also help with his nausea.  Patient will RTC to see Dr. Jacinto Reap on 11/2  Patient expressed understanding and was in agreement with this plan. He also understands that He can call clinic at any time with any questions, concerns, or complaints.   Thank you for allowing me to participate in the care of this very pleasant patient.   Time Total: 25 minutes  Visit consisted of counseling and education dealing with the complex and emotionally intense issues of symptom management in the setting of serious illness.Greater than 50%  of this time was spent counseling and coordinating care related to the above assessment and plan.  Signed by: Altha Harm, PhD, NP-C

## 2022-08-14 NOTE — Patient Instructions (Signed)
Please call [phone] give the insurance the case# [id number] for your BRAIN MRI

## 2022-08-15 ENCOUNTER — Ambulatory Visit
Admission: RE | Admit: 2022-08-15 | Discharge: 2022-08-15 | Disposition: A | Discharge status: Home / Self Care | Payer: Managed Care, Other (non HMO) | Source: Ambulatory Visit | Attending: Internal Medicine | Admitting: Internal Medicine

## 2022-08-15 VITALS — Wt 226.0 lb

## 2022-08-15 DIAGNOSIS — Z01818 Encounter for other preprocedural examination: Secondary | ICD-10-CM | POA: Diagnosis not present

## 2022-08-15 DIAGNOSIS — R188 Other ascites: Secondary | ICD-10-CM | POA: Diagnosis not present

## 2022-08-15 DIAGNOSIS — C3432 Malignant neoplasm of lower lobe, left bronchus or lung: Secondary | ICD-10-CM | POA: Diagnosis present

## 2022-08-15 DIAGNOSIS — K769 Liver disease, unspecified: Secondary | ICD-10-CM | POA: Insufficient documentation

## 2022-08-15 LAB — THYROID PANEL WITH TSH
Free Thyroxine Index: 2.3 (ref 1.2–4.9)
T3 Uptake Ratio: 24 % (ref 24–39)
T4, Total: 9.4 ug/dL (ref 4.5–12.0)
TSH: 1.75 u[IU]/mL (ref 0.450–4.500)

## 2022-08-15 LAB — GLUCOSE, CAPILLARY: Glucose-Capillary: 90 mg/dL (ref 70–99)

## 2022-08-15 LAB — T4: T4, Total: 7.9 ug/dL (ref 4.5–12.0)

## 2022-08-15 MED ORDER — FLUDEOXYGLUCOSE F - 18 (FDG) INJECTION
11.9000 | Freq: Once | INTRAVENOUS | Status: AC | PRN
  Administered 2022-08-15: 12.26 via INTRAVENOUS

## 2022-08-16 ENCOUNTER — Other Ambulatory Visit: Payer: Self-pay

## 2022-08-16 ENCOUNTER — Telehealth: Payer: Self-pay

## 2022-08-16 ENCOUNTER — Other Ambulatory Visit: Payer: Managed Care, Other (non HMO)

## 2022-08-16 ENCOUNTER — Encounter: Payer: Self-pay | Admitting: Internal Medicine

## 2022-08-16 ENCOUNTER — Inpatient Hospital Stay: Payer: Managed Care, Other (non HMO) | Attending: Internal Medicine | Admitting: Internal Medicine

## 2022-08-16 ENCOUNTER — Inpatient Hospital Stay: Payer: Managed Care, Other (non HMO)

## 2022-08-16 VITALS — BP 120/90 | HR 89 | Temp 98.7°F | Resp 20 | Wt 224.3 lb

## 2022-08-16 DIAGNOSIS — Z452 Encounter for adjustment and management of vascular access device: Secondary | ICD-10-CM | POA: Insufficient documentation

## 2022-08-16 DIAGNOSIS — K769 Liver disease, unspecified: Secondary | ICD-10-CM | POA: Diagnosis not present

## 2022-08-16 DIAGNOSIS — Z7901 Long term (current) use of anticoagulants: Secondary | ICD-10-CM | POA: Diagnosis not present

## 2022-08-16 DIAGNOSIS — C7931 Secondary malignant neoplasm of brain: Secondary | ICD-10-CM | POA: Diagnosis not present

## 2022-08-16 DIAGNOSIS — I81 Portal vein thrombosis: Secondary | ICD-10-CM | POA: Insufficient documentation

## 2022-08-16 DIAGNOSIS — C787 Secondary malignant neoplasm of liver and intrahepatic bile duct: Secondary | ICD-10-CM | POA: Insufficient documentation

## 2022-08-16 DIAGNOSIS — C3432 Malignant neoplasm of lower lobe, left bronchus or lung: Secondary | ICD-10-CM

## 2022-08-16 DIAGNOSIS — C7951 Secondary malignant neoplasm of bone: Secondary | ICD-10-CM | POA: Diagnosis not present

## 2022-08-16 DIAGNOSIS — J90 Pleural effusion, not elsewhere classified: Secondary | ICD-10-CM | POA: Insufficient documentation

## 2022-08-16 DIAGNOSIS — Z5111 Encounter for antineoplastic chemotherapy: Secondary | ICD-10-CM | POA: Diagnosis present

## 2022-08-16 DIAGNOSIS — Z5112 Encounter for antineoplastic immunotherapy: Secondary | ICD-10-CM | POA: Diagnosis present

## 2022-08-16 DIAGNOSIS — Z5189 Encounter for other specified aftercare: Secondary | ICD-10-CM | POA: Diagnosis not present

## 2022-08-16 LAB — URINALYSIS, DIPSTICK ONLY
Bilirubin Urine: NEGATIVE
Glucose, UA: NEGATIVE mg/dL
Hgb urine dipstick: NEGATIVE
Ketones, ur: NEGATIVE mg/dL
Leukocytes,Ua: NEGATIVE
Nitrite: NEGATIVE
Protein, ur: 30 mg/dL — AB
Specific Gravity, Urine: 1.026 (ref 1.005–1.030)
pH: 5 (ref 5.0–8.0)

## 2022-08-16 MED ORDER — ALTEPLASE 2 MG IJ SOLR
2.0000 mg | Freq: Once | INTRAMUSCULAR | Status: AC | PRN
  Administered 2022-08-16: 2 mg
  Filled 2022-08-16: qty 2

## 2022-08-16 MED ORDER — PEGFILGRASTIM INJECTION 6 MG/0.6ML ~~LOC~~: 6.0000 mg | PREFILLED_SYRINGE | Freq: Once | SUBCUTANEOUS | 2 refills | Status: AC

## 2022-08-16 NOTE — Telephone Encounter (Signed)
Liver biopsy to be scheduled STAT/ASAP request faxed to specialty scheduling.

## 2022-08-16 NOTE — Assessment & Plan Note (Addendum)
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 Currently on osimertinib 80 mg a day [osimertinib [since June 28th, 2022].  OCT 2nd, 2023- CT scan- Enlarging large complex thick walled left-sided pleural fluid collection.  Recommend biopsy liver for further evaluation./Repeat NGS testing. Liquid Biopsy/Guardant testing-Pending.  Discussed at tumor conference. Also discussed imaging at tumor conference. NOV 1st, 2023- PET scan- shows progressive disease in liver.  New tracer avid upper abdominal lymph nodes compatible with nodal metastasis; New tracer avid nodule within the central left midlung compatible with concerning for locally recurrent tumor or metastatic disease.; Similar appearance of large loculated left pleural effusion with mild overlying tracer uptake; New small volume of ascites within the right iliac fossa and central pelvis with mild adjacent thickening of the peritoneal reflections. Progressive interlobular septal thickening within the right middle lobe and right lower lobe. Findings are nonspecific and may reflect asymmetric pulmonary edema. Lymphangitic spread of tumor is not excluded.  Recommend liver biopsy to rule out transformation.  #Recommend chemo-immunotherapy-carbo+ Taxol plus Avastin plus Atezo. D-2 neulasta.  Pending- repeat Biopsy. Also discussed re: second opinion at [facility] if interested.   # I reviewed at length the individual components with chemotherapy; and the schedule in detail.  I also discussed the potential side effects including but not limited to-increasing fatigue, nausea vomiting, diarrhea, hair loss, sores in the mouth, increase risk of infection and also neuropathy.  Also reviewed the multiple strategies to avoid/mitigate similar side effects including preemptive medications-for nausea vomiting.  Sent new prescription for antiemetics.   I discussed the mechanism of action; The goal of therapy is palliative; and length of  treatments are likely ongoing/based upon the results of the scans. Discussed the potential side effects of immunotherapy including but not limited to diarrhea; skin rash; elevated LFTs/endocrine abnormalities etc.  #Asymptomatic multiple brain mets subcentimeter asymptomatic -JUNE 29th, 2023- Numerous metastatic lesions in the supratentorial brain-increase in number also conspicuity compared to January 2023.  Pre-SBRT MRI brain AUG 18th, 2023-  Eighteen small enhancing brain metastases (punctate to 11 mm); were all present on 03/28/2021-s/p SBRT SEP 2023- [[place]].  Likely to be causing any worsening headaches.  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  #Left lung PE [incidental on CT SEP 13th,2022-]?Symptomatic DVT of the right calf-on Eliquis-STABLE.  #Bone metastases- CT DEc 7th, 2022 to healing process.  Dental evaluation on 9/21.  Continue calcium vitamin D intake. calcium 8.1; ?  Osimertinib. HOLD zometa.   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  Will refill- STABLE.  # Continued hypocalcemia- ?  Symptomatic cramps.  Vitamin D levels normal.  June; July 2023 with repeat CK-within normal limits.  Monitor for now  # IV access/poor access-: port flush.   # [*home- neulasta- with cycle #2.  # DISPOSITION:   # Liver Biopsy STAT/ASAP # Chemo [carbo-Taxol-Atezo+Bev] as planned tomorrow;   # schedule neulasta on 11/06.  # follow up in 1 week- APP- labs- cbc/cmp;possible IVFs over 1 hour  # Follow up on 11/27- MD; labs- cbc/cmp; CEA; UA; D-2 carbo-Taxol-Atezo+Bev- Dr.B  Addendum: Ordered stat ultrasound-guided liver biopsy-reviewed at the tumor conference.   # 40 minutes face-to-face with the patient discussing the above plan of care; more than 50% of time spent on prognosis/ natural history;  counseling and coordination.  Addendm: discussed at  tumor conference- recommend Liver biopsy- ordered.  I have reached out to radiation oncology/neurooncology regarding MRI brain.

## 2022-08-16 NOTE — Progress Notes (Signed)
[place] NOTE  Patient Care Team: Pcp, No as PCP - General [name], RN as Oncology Nurse Navigator [name], MD as Consulting Physician (Internal Medicine)  CHIEF COMPLAINTS/PURPOSE OF CONSULTATION: Lung cancer  #  Oncology History Overview Note  IMPRESSION: 1. Patchy nodular fat stranding throughout the anterior left upper quadrant peritoneal fat, nonspecific, cannot exclude peritoneal carcinomatosis. Dedicated CT abdomen/pelvis with oral and IV contrast recommended for further evaluation. 2. Dense patchy consolidation replacing much of the left lower lung lobe, appearing masslike in the superior segment left lower lobe, with associated bulging of the left major fissure and associated left lower lobe volume loss. Fine nodularity throughout both lungs with an upper lobe predominance. Asymmetric left upper lobe interlobular septal thickening. These findings are indeterminate, with differential including multilobar pneumonia, sarcoidosis or a neoplastic process. The persistence on radiographs back to 01/20/2021 despite antibiotic therapy make sarcoidosis or a neoplastic process more likely. Pulmonology consultation suggested for consideration of bronchoscopic evaluation. 3. Small dependent left pleural effusion. 4. Mild mediastinal lymphadenopathy, nonspecific. 5. Subacute healing lateral right sixth rib fracture.   DIAGNOSIS:  A. LUNG, LEFT LOWER LOBE; ENB-ASSISTED BIOPSY:  - NON-SMALL CELL CARCINOMA, FAVOR ADENOCARCINOMA.  - FOREIGN MATERIAL SUGGESTIVE OF ASPIRATION.   # EGFR MUTATED: 36 del- June 28th, 2022- [name]. [limited]  # Asymptomatic multiple brain mets subcentimeter asymptomatic -JUNE 29th, 2023- Numerous metastatic lesions in the supratentorial brain-increase in number also conspicuity compared to January 2023.  Pre-SBRT MRI brain AUG 18th, 2023-  Eighteen small enhancing brain metastases (punctate to 11 mm); were all  present on 03/28/2021-s/p SBRT SEP 2023- [[place]]  # SEP-OCT-worsening left-sided pleural effusion-status post paracentesis; exudative; Cytology negative- ?  Progressive disease..   Cancer of lower lobe of left lung ([place])  03/23/2021 Initial Diagnosis   Cancer of lower lobe of left lung ([place])   03/29/2021 Cancer Staging   Staging form: Lung, AJCC 8th Edition - Clinical: Stage IVB (cT3, cN3, pM1c) - Signed by [name], MD on 03/29/2021   03/30/2021 - 03/30/2021 Chemotherapy   Patient is on Treatment Plan : LUNG NSCLC Pemetrexed ([place]) / Carboplatin q21d x 1 cycles     08/17/2022 -  Chemotherapy   Patient is on Treatment Plan : LUNG Atezolizumab + Bevacizumab + Carboplatin + Paclitaxel q21d Induction x 4 cycles / Atezolizumab + Bevacizumab q21d Maintenance      HISTORY OF PRESENTING ILLNESS: Ambulating independently.  With wife.   [name] 41 y.o.  male patient with stage IV lung cancer adenocarcinoma-brain mets [synchronous] bone mets EGFR mutated on osimertinib is here for follow-up/review results of the PET scan/Brain MRI.  Osimertinib was discontinued approximately 1 week ago because of elevated LFTs.  In the interim patient was evaluated in the symptom management clinic for feeling poorly.  Complains of abdominal pain.  Complains of headaches; come and go; intermittent.  No nausea or vomiting.  Reflux like symptoms. Complains of  "uncomfortable feeling around the chest" . Mild to moderate cough.   Denies any worsening pain.  No worsening skin rash.  Review of Systems  Constitutional:  Positive for malaise/fatigue. Negative for chills, diaphoresis and fever.  HENT:  Negative for nosebleeds and sore throat.   Eyes:  Negative for double vision.  Respiratory:  Positive for cough. Negative for wheezing.   Cardiovascular:  Negative for chest pain, palpitations, orthopnea and leg swelling.  Gastrointestinal:  Positive for heartburn and nausea. Negative for abdominal pain,  blood in stool, diarrhea  and melena.  Genitourinary:  Negative for dysuria, frequency and urgency.  Musculoskeletal:  Negative for back pain and joint pain.  Skin:  Negative for itching.  Neurological:  Positive for dizziness, weakness and headaches. Negative for tingling and focal weakness.  Endo/Heme/Allergies:  Does not bruise/bleed easily.  Psychiatric/Behavioral:  Negative for depression. The patient is not nervous/anxious and does not have insomnia.      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    SURGICAL HISTORY: Past Surgical History:  Procedure Laterality Date   FRACTURE SURGERY     broke femur when he was 6 years   PORTA CATH INSERTION N/A 03/28/2021   Procedure: PORTA CATH INSERTION;  Surgeon: Katha Cabal, MD;  Location: [facility];  Service: Cardiovascular;  Laterality: N/A;   VIDEO BRONCHOSCOPY WITH ENDOBRONCHIAL NAVIGATION N/A 03/15/2021   Procedure: VIDEO BRONCHOSCOPY WITH ENDOBRONCHIAL NAVIGATION;  Surgeon: Ottie Glazier, MD;  Location: [facility];  Service: Thoracic;  Laterality: N/A;   VIDEO BRONCHOSCOPY WITH ENDOBRONCHIAL ULTRASOUND N/A 03/15/2021   Procedure: VIDEO BRONCHOSCOPY WITH ENDOBRONCHIAL ULTRASOUND;  Surgeon: Ottie Glazier, MD;  Location: [facility];  Service: Thoracic;  Laterality: N/A;    SOCIAL HISTORY: Social History   Socioeconomic History   Marital status: Married    Spouse name: [name]   Number of children: 2   Years of education: Not on file   Highest education level: Not on file  Occupational History   Not on file  Tobacco Use   Smoking status: Never   Smokeless tobacco: Current    Types: Snuff, Chew  Vaping Use   Vaping Use: Never used  Substance and Sexual Activity   Alcohol use: Never   Drug use: Never   Sexual activity: Not on file  Other Topics Concern   Not on file  Social History  Narrative   [place]; with wife; 2 daughters[12 and 22]; never smoked; rare alcohol. Work in saw [employer]    Social Determinants of Health   Financial Resource Strain: Low Risk  (05/10/2022)   Overall Financial Resource Strain (CARDIA)    Difficulty of Paying Living Expenses: Not hard at all  Food Insecurity: No Food Insecurity (05/10/2022)   Hunger Vital Sign    Worried About Running Out of Food in the Last Year: Never true    Ran Out of Food in the Last Year: Never true  Transportation Needs: No Transportation Needs (03/16/2022)   PRAPARE - Hydrologist (Medical): No    Lack of Transportation (Non-Medical): No  Physical Activity: Not on file  Stress: Not on file  Social Connections: Socially Integrated (05/10/2022)   Social Connection and Isolation Panel [NHANES]    Frequency of Communication with Friends and Family: More than three times a week    Frequency of Social Gatherings with Friends and Family: More than three times a week    Attends Religious Services: More than 4 times per year    Active Member of [employer] or Organizations: Yes    Attends Music therapist: More than 4 times per year    Marital Status: Married  Human resources officer Violence: Not on file    FAMILY HISTORY: Family History  Problem Relation Age of Onset   High Cholesterol Mother    Hypertension Father  Cancer Father    Lung cancer Father    Skin cancer Father     ALLERGIES:  is allergic to codeine.  MEDICATIONS:  Current Outpatient Medications  Medication Sig Dispense Refill   apixaban (ELIQUIS) 5 MG TABS tablet Take 1 tablet (5 mg total) by mouth 2 (two) times daily. 60 tablet 2   calcium carbonate (TUMS EX) 750 MG chewable tablet Chew 1 tablet by mouth daily.     dronabinol (MARINOL) 2.5 MG capsule Take 1 capsule (2.5 mg total) by mouth 2 (two) times daily before lunch and supper. 60 capsule 0   folic acid (FOLVITE) 1 MG tablet Take 1 tablet (1 mg  total) by mouth daily. 90 tablet 1   lidocaine-prilocaine (EMLA) cream Apply 1 application topically as needed. 30 g 0   metoprolol succinate (TOPROL-XL) 50 MG 24 hr tablet Take 50 mg by mouth daily. Take with or immediately following a meal.     omeprazole (PRILOSEC) 20 MG capsule Take 1 capsule (20 mg total) by mouth daily. 30 capsule 2   ondansetron (ZOFRAN) 8 MG tablet One pill every 8 hours as needed for nausea/vomitting. 40 tablet 1   prochlorperazine (COMPAZINE) 10 MG tablet Take 1 tablet (10 mg total) by mouth every 6 (six) hours as needed for nausea or vomiting. 40 tablet 1   sertraline (ZOLOFT) 50 MG tablet TAKE 1/2 TABLET BY MOUTH DAILY 30 tablet 2   TAGRISSO 80 MG tablet TAKE 1 TABLET DAILY 30 tablet 4   No current facility-administered medications for this visit.   Facility-Administered Medications Ordered in Other Visits  Medication Dose Route Frequency Provider Last Rate Last Admin   heparin lock flush 100 UNIT/ML injection            heparin lock flush 100 UNIT/ML injection            sodium chloride flush (NS) 0.9 % injection 10 mL  10 mL Intravenous PRN [name], MD   10 mL at 07/28/21 0852      .  PHYSICAL EXAMINATION: ECOG PERFORMANCE STATUS: 1 - Symptomatic but completely ambulatory  Vitals:   08/16/22 1142  BP: (!) 120/90  Pulse: 89  Resp: 20  Temp: 98.7 F (37.1 C)  SpO2: 100%      Filed Weights   08/16/22 1142  Weight: 224 lb 4.8 oz (101.7 kg)        Physical Exam HENT:     Head: Normocephalic and atraumatic.     Mouth/Throat:     Pharynx: No oropharyngeal exudate.  Eyes:     Pupils: Pupils are equal, round, and reactive to light.  Cardiovascular:     Rate and Rhythm: Normal rate and regular rhythm.  Pulmonary:     Effort: No respiratory distress.     Breath sounds: No wheezing.     Comments: Decreased breath sound on the left side compared to right. Abdominal:     General: Bowel sounds are normal. There is no distension.      Palpations: Abdomen is soft. There is no mass.     Tenderness: There is no abdominal tenderness. There is no guarding or rebound.  Musculoskeletal:        General: No tenderness. Normal range of motion.     Cervical back: Normal range of motion and neck supple.  Skin:    General: Skin is warm.  Neurological:     Mental Status: He is alert and oriented to person, place, and time.  Psychiatric:        Mood and Affect: Affect normal.    Fungal dermatitis bilateral buttocks groin/underneath abdominal pannus  LABORATORY DATA:  I have reviewed the data as listed Lab Results  Component Value Date   WBC 7.5 08/14/2022   HGB 15.1 08/14/2022   HCT 46.7 08/14/2022   MCV 87.6 08/14/2022   PLT 208 08/14/2022   Recent Labs    07/18/22 1526 08/10/22 1124 08/14/22 1332  NA 136 134* 135  K 3.7 3.8 3.9  CL 106 101 101  CO2 _0 GLUCOSE 88 94 84  BUN _1 CREATININE 1.17 1.21 1.22  CALCIUM 8.0* 8.5* 8.6*  GFRNONAA >60 >60 >60  PROT 7.3 7.6 7.7  ALBUMIN 3.6 3.6 3.7  AST 38 100* 143*  ALT 42 110* 148*  ALKPHOS 162* 266* 334*  BILITOT 0.6 0.8 0.6    RADIOGRAPHIC STUDIES: I have personally reviewed the radiological images as listed and agreed with the findings in the report. NM PET Image Restage (PS) Skull Base to Thigh (F-18 FDG)  Result Date: 08/16/2022 CLINICAL DATA:  Subsequent treatment strategy for recurrent non-small cell lung cancer. EXAM: NUCLEAR MEDICINE PET SKULL BASE TO THIGH TECHNIQUE: 12.26 mCi F-18 FDG was injected intravenously. Full-ring PET imaging was performed from the skull base to thigh after the radiotracer. CT data was obtained and used for attenuation correction and anatomic localization. Fasting blood glucose: 90 mg/dl COMPARISON:  PET-CT from 01/31/2022 FINDINGS: Mediastinal blood pool activity: SUV max 1.95 Liver activity: SUV max NA NECK: No hypermetabolic lymph nodes in the neck. Incidental CT findings: None. CHEST: No tracer avid axillary,  supraclavicular, or mediastinal adenopathy. There is a large loculated left pleural effusion which appears similar in volume to the previous exam. There is overlying mild increased tracer uptake within the overlying pleural with SUV max of 1.89 on today's study versus 3.25 previously. Central left midlung tracer avid lung nodule is identified just lateral to the descending aorta measuring 1.1 cm with SUV max of 7.37. There is a tiny nodule in the right upper lobe measuring 6 mm which is too small to reliably characterize, image 82/2. Unchanged subpleural nodule in the right middle lobe measuring 6 mm, also too small to characterize, image 115/2. Progressive interlobular septal thickening is identified within the right middle lobe and right lower lobe. Incidental CT findings: Mild cardiac enlargement. No pericardial effusion. ABDOMEN/PELVIS: Multiple ill-defined foci of abnormal increased radiotracer uptake identified within both lobes of liver, new compared with the previous exam: Index lesions include: Within the lateral segment of left hepatic lobe there is a focal area of low-density measuring 5 x 3.6 cm with SUV max of 7.19, image 133/2. Within the inferior right lobe of liver there is an ill-defined area of low attenuation measuring 5.9 x 3.0 cm with SUV max of 9.12, image 162/2. Within the lateral aspect posterior right lobe of liver there is a focal area of increased uptake measuring approximately 3.26 cm with SUV max of 9.49. Margins of this lesion are difficult to visualize on the unenhanced CT images. Portacaval lymph node is tracer avid measuring 1 cm with SUV max of 4.86, image 151/2. New from previous exam Adjacent porta hepatic node measures 1.1 cm with SUV max of 4.51, image 147/2. New from previous exam. Mildly tracer avid aortocaval lymph node measures 7 mm with SUV max of 3.12. New from the previous exam. No tracer avid pelvic or inguinal lymph nodes. New small volume  of ascites identified within  the right iliac fossa and central pelvis with mild adjacent thickening of the peritoneal reflections. No tracer avid peritoneal nodule identified. Incidental CT findings: None. SKELETON: No focal hypermetabolic activity to suggest skeletal metastasis. Incidental CT findings: None. IMPRESSION: 1. Interval development of multifocal tracer avid liver lesions compatible with metastatic disease. 2. New tracer avid upper abdominal lymph nodes compatible with nodal metastasis. 3. New tracer avid nodule within the central left midlung compatible with concerning for locally recurrent tumor or metastatic disease. 4. Similar appearance of large loculated left pleural effusion with mild overlying tracer uptake. 5. New small volume of ascites within the right iliac fossa and central pelvis with mild adjacent thickening of the peritoneal reflections. No tracer avid peritoneal nodule identified. Correlate for any clinical signs or symptoms of early peritoneal disease. 6. Progressive interlobular septal thickening within the right middle lobe and right lower lobe. Findings are nonspecific and may reflect asymmetric pulmonary edema. Lymphangitic spread of tumor is not excluded. Electronically Signed   By: Kerby Moors M.D.   On: 08/16/2022 09:11   MR Brain W Wo Contrast  Result Date: 08/16/2022 CLINICAL DATA:  Non-small-cell lung cancer. 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 Assess response to treatment. EXAM: MRI HEAD WITHOUT AND WITH CONTRAST TECHNIQUE: Multiplanar, multiecho pulse sequences of the brain and surrounding structures were obtained without and with intravenous contrast. CONTRAST:  56m GADAVIST GADOBUTROL 1 MMOL/ML IV SOLN COMPARISON:  MRI head 05/29/2022 FINDINGS: Brain: Numerous small enhancing lesions in the brain compatible with widespread metastatic disease. Identified lesions are stable to smaller. No progression in size and no new lesions identified. There is however increased edema in the left posterior frontal  white matter associated with metastatic disease. Mild sulcal FLAIR hyperintensity posteriorly has developed since the prior study. No abnormal enhancement. Ventricle size normal. No acute infarct. Mild hemorrhage associated with metastatic deposits in the right parietal lobe and left posterior frontal lobe and right medial frontal lobe unchanged. Vascular: Normal arterial flow voids. Skull and upper cervical spine: No skeletal metastasis. Sinuses/Orbits: Paranasal sinuses clear.  Negative orbit Other: None IMPRESSION: 1. Numerous small enhancing lesions in the brain compatible with metastatic disease. These are stable to smaller compared with the prior study. No new lesions identified. 2. There is increased edema in the left posterior frontal white matter associated with metastatic disease. 3. Mild sulcal FLAIR hyperintensity posteriorly has developed since the prior study. This could be due to subarachnoid hemorrhage, supplemental oxygen therapy, or possibly leptomeningeal carcinomatosis. Attention on follow-up. Electronically Signed   By: CFranchot GalloM.D.   On: 08/16/2022 09:10   [place]ORACENTESIS ASP PLEURAL SPACE W/IMG GUIDE  Result Date: 07/26/2022 INDICATION: Patient with history of left lower lung cancer, recurrent left pleural effusion. Request is made for diagnostic and therapeutic thoracentesis. EXAM: ULTRASOUND GUIDED DIAGNOSTIC THORACENTESIS MEDICATIONS: 15 mL 1% lidocaine COMPLICATIONS: None immediate. PROCEDURE: An ultrasound guided thoracentesis was thoroughly discussed with the patient and questions answered. The benefits, risks, alternatives and complications were also discussed. The patient understands and wishes to proceed with the procedure. Written consent was obtained. Ultrasound was performed to localize and mark an adequate pocket of fluid in the left chest. Fluid appeared thick and complex by ultrasound. The area was then prepped and draped in the normal sterile fashion. 1% Lidocaine  was used for local anesthesia. Under ultrasound guidance a 6 Fr Safe-T-Centesis catheter was first introduced. Despite confirmation by imaging of being within the fluid collection a minimal amount of fluid was  able to be aspirated. Dr. Anselm Pancoast to bedside and was able to access fluid pocket with a 7 cm Yueh. Thoracentesis was performed. The catheter was removed and a dressing applied. FINDINGS: A total of approximately 200 mL of dark, bloody fluid was removed. Samples were sent to the laboratory as requested by the clinical team. IMPRESSION: Successful ultrasound guided left thoracentesis yielding 200 mL of pleural fluid. Read by: Brynda Greathouse PA-C Electronically Signed   By: Markus Daft M.D.   On: 07/26/2022 15:06   DG Chest Port 1 View  Result Date: 07/26/2022 CLINICAL DATA:  Status post left thoracentesis EXAM: PORTABLE CHEST 1 VIEW COMPARISON:  04/18/2022.  CT 07/16/2022. FINDINGS: Large loculated left pleural effusion is again noted. No pneumothorax following thoracentesis. Airspace disease in the left lung likely reflects atelectasis. Right lung clear. Heart is normal size. No acute bony abnormality. IMPRESSION: Continued loculated large left pleural effusion.  No pneumothorax. Electronically Signed   By: Rolm Baptise M.D.   On: 07/26/2022 14:23    ASSESSMENT & PLAN:   Cancer of lower lobe of left lung ([place]) # STAGE IV- Left lower lobe lung cancer-adenocarcinoma the lung;  EGFR- 19 del POSITIVE. Currently on osimertinib 80 mg a day [osimertinib [since June 28th, 2022].  OCT 2nd, 2023- CT scan- Enlarging large complex thick walled left-sided pleural fluid collection.  Recommend biopsy liver for further evaluation./Repeat NGS testing. Liquid Biopsy/Guardant testing-Pending.  Discussed at tumor conference. Also discussed imaging at tumor conference. NOV 1st, 2023- PET scan- shows progressive disease in liver.  New tracer avid upper abdominal lymph nodes compatible with nodal metastasis; New tracer avid  nodule within the central left midlung compatible with concerning for locally recurrent tumor or metastatic disease.; Similar appearance of large loculated left pleural effusion with mild overlying tracer uptake; New small volume of ascites within the right iliac fossa and central pelvis with mild adjacent thickening of the peritoneal reflections. Progressive interlobular septal thickening within the right middle lobe and right lower lobe. Findings are nonspecific and may reflect asymmetric pulmonary edema. Lymphangitic spread of tumor is not excluded.  Recommend liver biopsy to rule out transformation.  #Recommend chemo-immunotherapy-carbo+ Taxol plus Avastin plus Atezo. D-2 neulasta.  Pending- repeat Biopsy. Also discussed re: second opinion at [facility] if interested.   # I reviewed at length the individual components with chemotherapy; and the schedule in detail.  I also discussed the potential side effects including but not limited to-increasing fatigue, nausea vomiting, diarrhea, hair loss, sores in the mouth, increase risk of infection and also neuropathy.  Also reviewed the multiple strategies to avoid/mitigate similar side effects including preemptive medications-for nausea vomiting.  Sent new prescription for antiemetics.   I discussed the mechanism of action; The goal of therapy is palliative; and length of treatments are likely ongoing/based upon the results of the scans. Discussed the potential side effects of immunotherapy including but not limited to diarrhea; skin rash; elevated LFTs/endocrine abnormalities etc.  #Asymptomatic multiple brain mets subcentimeter asymptomatic -JUNE 29th, 2023- Numerous metastatic lesions in the supratentorial brain-increase in number also conspicuity compared to January 2023.  Pre-SBRT MRI brain AUG 18th, 2023-  Eighteen small enhancing brain metastases (punctate to 11 mm); were all present on 03/28/2021-s/p SBRT SEP 2023- [[place]].  Likely to be causing any  worsening headaches.  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  #Left lung PE [incidental on CT SEP  13th,2022-]?Symptomatic DVT of the right calf-on Eliquis-STABLE.  #Bone metastases- CT DEc 7th, 2022 to healing process.  Dental evaluation on 9/21.  Continue calcium vitamin D intake. calcium 8.1; ?  Osimertinib. HOLD zometa.   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  Will refill- STABLE.  # Continued hypocalcemia- ?  Symptomatic cramps.  Vitamin D levels normal.  June; July 2023 with repeat CK-within normal limits.  Monitor for now  # IV access/poor access-: port flush.   # [*home- neulasta- with cycle #2.  # DISPOSITION:   # Liver Biopsy STAT/ASAP # Chemo [carbo-Taxol-Atezo+Bev] as planned tomorrow;   # schedule neulasta on 11/06.  # follow up in 1 week- APP- labs- cbc/cmp;possible IVFs over 1 hour  # Follow up on 11/27- MD; labs- cbc/cmp; CEA; UA; D-2 carbo-Taxol-Atezo+Bev- Dr.B  Addendum: Ordered stat ultrasound-guided liver biopsy-reviewed at the tumor conference.   # 40 minutes face-to-face with the patient discussing the above plan of care; more than 50% of time spent on prognosis/ natural history; counseling and coordination.                 All questions were answered. The patient knows to call the clinic with any problems, questions or concerns.    [name], MD 08/17/2022 7:48 AM

## 2022-08-16 NOTE — Progress Notes (Signed)
Patient states he does not feel good. Patient states his "belly hurts", has no energy.

## 2022-08-16 NOTE — Telephone Encounter (Signed)
Insurance approved patient to receive 1 Neulasta injection in office then will need to be self administered by patient at home.    Call placed to [facility] oncology line at [phone].  Verbal order for shipment of Nuelasta (rx directions provided per rx entered in chart by MD) for home administration.  Requested home teaching for self administration and that department will review request.

## 2022-08-17 ENCOUNTER — Inpatient Hospital Stay: Payer: Managed Care, Other (non HMO)

## 2022-08-17 ENCOUNTER — Other Ambulatory Visit: Payer: Self-pay | Admitting: Radiation Therapy

## 2022-08-17 ENCOUNTER — Encounter: Payer: Self-pay | Admitting: Internal Medicine

## 2022-08-17 ENCOUNTER — Encounter: Payer: Self-pay | Admitting: Nurse Practitioner

## 2022-08-17 VITALS — BP 127/73 | HR 68 | Temp 96.7°F | Resp 18

## 2022-08-17 DIAGNOSIS — C3432 Malignant neoplasm of lower lobe, left bronchus or lung: Secondary | ICD-10-CM

## 2022-08-17 MED ORDER — SODIUM CHLORIDE 0.9 % IV SOLN
175.0000 mg/m2 | Freq: Once | INTRAVENOUS | Status: AC
  Administered 2022-08-17: 396 mg via INTRAVENOUS
  Filled 2022-08-17: qty 66

## 2022-08-17 MED ORDER — FAMOTIDINE IN NACL 20-0.9 MG/50ML-% IV SOLN
20.0000 mg | Freq: Once | INTRAVENOUS | Status: AC
  Administered 2022-08-17: 20 mg via INTRAVENOUS
  Filled 2022-08-17: qty 50

## 2022-08-17 MED ORDER — SODIUM CHLORIDE 0.9 % IV SOLN
150.0000 mg | Freq: Once | INTRAVENOUS | Status: AC
  Administered 2022-08-17: 150 mg via INTRAVENOUS
  Filled 2022-08-17: qty 150

## 2022-08-17 MED ORDER — SODIUM CHLORIDE 0.9 % IV SOLN
1200.0000 mg | Freq: Once | INTRAVENOUS | Status: AC
  Administered 2022-08-17: 1200 mg via INTRAVENOUS
  Filled 2022-08-17: qty 20

## 2022-08-17 MED ORDER — HEPARIN SOD (PORK) LOCK FLUSH 100 UNIT/ML IV SOLN
500.0000 [IU] | Freq: Once | INTRAVENOUS | Status: AC | PRN
  Administered 2022-08-17: 500 [IU]
  Filled 2022-08-17: qty 5

## 2022-08-17 MED ORDER — SODIUM CHLORIDE 0.9 % IV SOLN
Freq: Once | INTRAVENOUS | Status: AC
  Filled 2022-08-17: qty 250

## 2022-08-17 MED ORDER — SODIUM CHLORIDE 0.9 % IV SOLN
10.0000 mg | Freq: Once | INTRAVENOUS | Status: AC
  Administered 2022-08-17: 10 mg via INTRAVENOUS
  Filled 2022-08-17: qty 10

## 2022-08-17 MED ORDER — DIPHENHYDRAMINE HCL 50 MG/ML IJ SOLN
50.0000 mg | Freq: Once | INTRAMUSCULAR | Status: AC
  Administered 2022-08-17: 50 mg via INTRAVENOUS
  Filled 2022-08-17: qty 1

## 2022-08-17 MED ORDER — SODIUM CHLORIDE 0.9% FLUSH
10.0000 mL | INTRAVENOUS | Status: DC | PRN
  Administered 2022-08-17: 10 mL
  Filled 2022-08-17: qty 10

## 2022-08-17 MED ORDER — PALONOSETRON HCL INJECTION 0.25 MG/5ML
0.2500 mg | Freq: Once | INTRAVENOUS | Status: AC
  Administered 2022-08-17: 0.25 mg via INTRAVENOUS
  Filled 2022-08-17: qty 5

## 2022-08-17 MED ORDER — SODIUM CHLORIDE 0.9 % IV SOLN
15.0000 mg/kg | Freq: Once | INTRAVENOUS | Status: AC
  Administered 2022-08-17: 1600 mg via INTRAVENOUS
  Filled 2022-08-17: qty 64

## 2022-08-17 MED ORDER — SODIUM CHLORIDE 0.9 % IV SOLN
701.0000 mg | Freq: Once | INTRAVENOUS | Status: AC
  Administered 2022-08-17: 700 mg via INTRAVENOUS
  Filled 2022-08-17: qty 70

## 2022-08-17 NOTE — Telephone Encounter (Signed)
Insurance PA form received from [place] for [name].  PA form has been completed and faxed back to [facility]

## 2022-08-17 NOTE — Patient Instructions (Signed)
[facility] CTR AT [place]  Discharge Instructions: Thank you for choosing [facility] to provide your oncology and hematology care.  If you have a lab appointment with the [facility], please go directly to the [facility] and check in at the registration area.  Wear comfortable clothing and clothing appropriate for easy access to any Portacath or PICC line.   We strive to give you quality time with your provider. You may need to reschedule your appointment if you arrive late (15 or more minutes).  Arriving late affects you and other patients whose appointments are after yours.  Also, if you miss three or more appointments without notifying the office, you may be dismissed from the clinic at the provider's discretion.      For prescription refill requests, have your pharmacy contact our office and allow 72 hours for refills to be completed.    Today you received the following chemotherapy and/or immunotherapy agents MVASI, TECENTRIQ, TAXOL. CARBOPLATIN      To help prevent nausea and vomiting after your treatment, we encourage you to take your nausea medication as directed.  BELOW ARE SYMPTOMS THAT SHOULD BE REPORTED IMMEDIATELY: *FEVER GREATER THAN 100.4 F (38 C) OR HIGHER *CHILLS OR SWEATING *NAUSEA AND VOMITING THAT IS NOT CONTROLLED WITH YOUR NAUSEA MEDICATION *UNUSUAL SHORTNESS OF BREATH *UNUSUAL BRUISING OR BLEEDING *URINARY PROBLEMS (pain or burning when urinating, or frequent urination) *BOWEL PROBLEMS (unusual diarrhea, constipation, pain near the anus) TENDERNESS IN MOUTH AND THROAT WITH OR WITHOUT PRESENCE OF ULCERS (sore throat, sores in mouth, or a toothache) UNUSUAL RASH, SWELLING OR PAIN  UNUSUAL VAGINAL DISCHARGE OR ITCHING   Items with * indicate a potential emergency and should be followed up as soon as possible or go to the Emergency Department if any problems should occur.  Please show the CHEMOTHERAPY ALERT CARD or IMMUNOTHERAPY  ALERT CARD at check-in to the Emergency Department and triage nurse.  Should you have questions after your visit or need to cancel or reschedule your appointment, please contact [facility] [place] AT [place]  [phone] and follow the prompts.  Office hours are 8:00 a.m. to 4:30 p.m. Monday - Friday. Please note that voicemails left after 4:00 p.m. may not be returned until the following business day.  We are closed weekends and major holidays. You have access to a nurse at all times for urgent questions. Please call the main number to the clinic [phone] and follow the prompts.  For any non-urgent questions, you may also contact your provider using MyChart. We now offer e-Visits for anyone 84 and older to request care online for non-urgent symptoms. For details visit [email or web address].   Also download the MyChart app! Go to the app store, search "MyChart", open the app, select [facility], and log in with your MyChart username and password.  Masks are optional in the cancer centers. If you would like for your care team to wear a mask while they are taking care of you, please let them know. For doctor visits, patients may have with them one support person who is at least 43 years old. At this time, visitors are not allowed in the infusion area.  Bevacizumab Injection What is this medication? BEVACIZUMAB (be va SIZ yoo mab) treats some types of cancer. It works by blocking a protein that causes cancer cells to grow and multiply. This helps to slow or stop the spread of cancer cells. It is a monoclonal antibody. This medicine may be used for  other purposes; ask your health care provider or pharmacist if you have questions. COMMON BRAND NAME(S): Alymsys, Avastin, MVASI, Noah Charon What should I tell my care team before I take this medication? They need to know if you have any of these conditions: Blood clots Coughing up blood Having or recent surgery Heart failure High blood  pressure History of a connection between 2 or more body parts that do not usually connect (fistula) History of a tear in your stomach or intestines Protein in your urine An unusual or allergic reaction to bevacizumab, other medications, foods, dyes, or preservatives Pregnant or trying to get pregnant Breast-feeding How should I use this medication? This medication is injected into a vein. It is given by your care team in a hospital or clinic setting. Talk to your care team the use of this medication in children. Special care may be needed. Overdosage: If you think you have taken too much of this medicine contact a poison control center or emergency room at once. NOTE: This medicine is only for you. Do not share this medicine with others. What if I miss a dose? Keep appointments for follow-up doses. It is important not to miss your dose. Call your care team if you are unable to keep an appointment. What may interact with this medication? Interactions are not expected. This list may not describe all possible interactions. Give your health care provider a list of all the medicines, herbs, non-prescription drugs, or dietary supplements you use. Also tell them if you smoke, drink alcohol, or use illegal drugs. Some items may interact with your medicine. What should I watch for while using this medication? Your condition will be monitored carefully while you are receiving this medication. You may need blood work while taking this medication. This medication may make you feel generally unwell. This is not uncommon as chemotherapy can affect healthy cells as well as cancer cells. Report any side effects. Continue your course of treatment even though you feel ill unless your care team tells you to stop. This medication may increase your risk to bruise or bleed. Call your care team if you notice any unusual bleeding. Before having surgery, talk to your care team to make sure it is ok. This medication can  increase the risk of poor healing of your surgical site or wound. You will need to stop this medication for 28 days before surgery. After surgery, wait at least 28 days before restarting this medication. Make sure the surgical site or wound is healed enough before restarting this medication. Talk to your care team if questions. Talk to your care team if you may be pregnant. Serious birth defects can occur if you take this medication during pregnancy and for 6 months after the last dose. Contraception is recommended while taking this medication and for 6 months after the last dose. Your care team can help you find the option that works for you. Do not breastfeed while taking this medication and for 6 months after the last dose. This medication can cause infertility. Talk to your care team if you are concerned about your fertility. What side effects may I notice from receiving this medication? Side effects that you should report to your care team as soon as possible: Allergic reactions--skin rash, itching, hives, swelling of the face, lips, tongue, or throat Bleeding--bloody or black, tar-like stools, vomiting blood or brown material that looks like coffee grounds, red or dark brown urine, small red or purple spots on skin, unusual bruising or bleeding  Blood clot--pain, swelling, or warmth in the leg, shortness of breath, chest pain Heart attack--pain or tightness in the chest, shoulders, arms, or jaw, nausea, shortness of breath, cold or clammy skin, feeling faint or lightheaded Heart failure--shortness of breath, swelling of the ankles, feet, or hands, sudden weight gain, unusual weakness or fatigue Increase in blood pressure Infection--fever, chills, cough, sore throat, wounds that don't heal, pain or trouble when passing urine, general feeling of discomfort or being unwell Infusion reactions--chest pain, shortness of breath or trouble breathing, feeling faint or lightheaded Kidney injury--decrease in  the amount of urine, swelling of the ankles, hands, or feet Stomach pain that is severe, does not go away, or gets worse Stroke--sudden numbness or weakness of the face, arm, or leg, trouble speaking, confusion, trouble walking, loss of balance or coordination, dizziness, severe headache, change in vision Sudden and severe headache, confusion, change in vision, seizures, which may be signs of posterior reversible encephalopathy syndrome (PRES) Side effects that usually do not require medical attention (report to your care team if they continue or are bothersome): Back pain Change in taste Diarrhea Dry skin Increased tears Nosebleed This list may not describe all possible side effects. Call your doctor for medical advice about side effects. You may report side effects to FDA at 1-800-FDA-1088. Where should I keep my medication? This medication is given in a hospital or clinic. It will not be stored at home. NOTE: This sheet is a summary. It may not cover all possible information. If you have questions about this medicine, talk to your doctor, pharmacist, or health care provider.  2023 Elsevier/Gold Standard (2022-02-02 00:00:00)  Atezolizumab Injection What is this medication? ATEZOLIZUMAB (a te zoe LIZ ue mab) treats some types of cancer. It works by helping your immune system slow or stop the spread of cancer cells. It is a monoclonal antibody. This medicine may be used for other purposes; ask your health care provider or pharmacist if you have questions. COMMON BRAND NAME(S): Tecentriq What should I tell my care team before I take this medication? They need to know if you have any of these conditions: Allogeneic stem cell transplant (uses someone else's stem cells) Autoimmune diseases, such as Crohn disease, ulcerative colitis, lupus History of chest radiation Nervous system problems, such as Guillain-Barre syndrome, myasthenia gravis Organ transplant An unusual or allergic reaction  to atezolizumab, other medications, foods, dyes, or preservatives Pregnant or trying to get pregnant Breast-feeding How should I use this medication? This medication is injected into a vein. It is given by your care team in a hospital or clinic setting. A special MedGuide will be given to you before each treatment. Be sure to read this information carefully each time. Talk to your care team about the use of this medication in children. While it may be prescribed for children as young as 2 years for selected conditions, precautions do apply. Overdosage: If you think you have taken too much of this medicine contact a poison control center or emergency room at once. NOTE: This medicine is only for you. Do not share this medicine with others. What if I miss a dose? Keep appointments for follow-up doses. It is important not to miss your dose. Call your care team if you are unable to keep an appointment. What may interact with this medication? Interactions have not been studied. This list may not describe all possible interactions. Give your health care provider a list of all the medicines, herbs, non-prescription drugs, or dietary supplements you  use. Also tell them if you smoke, drink alcohol, or use illegal drugs. Some items may interact with your medicine. What should I watch for while using this medication? Your condition will be monitored carefully while you are receiving this medication. You may need blood work while taking this medication. This medication may cause serious skin reactions. They can happen weeks to months after starting the medication. Contact your care team right away if you notice fevers or flu-like symptoms with a rash. The rash may be red or purple and then turn into blisters or peeling of the skin. You may also notice a red rash with swelling of the face, lips, or lymph nodes in your neck or under your arms. Tell your care team right away if you have any change in your  eyesight. Talk to your care team if you may be pregnant. Serious birth defects can occur if you take this medication during pregnancy and for 5 months after the last dose. You will need a negative pregnancy test before starting this medication. Contraception is recommended while taking this medication and for 5 months after the last dose. Your care team can help you find the option that works for you. Do not breastfeed while taking this medication and for at least 5 months after the last dose. What side effects may I notice from receiving this medication? Side effects that you should report to your doctor or health care professional as soon as possible: Allergic reactions--skin rash, itching, hives, swelling of the face, lips, tongue, or throat Dry cough, shortness of breath or trouble breathing Eye pain, redness, irritation, or discharge with blurry or decreased vision Heart muscle inflammation--unusual weakness or fatigue, shortness of breath, chest pain, fast or irregular heartbeat, dizziness, swelling of the ankles, feet, or hands Hormone gland problems--headache, sensitivity to light, unusual weakness or fatigue, dizziness, fast or irregular heartbeat, increased sensitivity to cold or heat, excessive sweating, constipation, hair loss, increased thirst or amount of urine, tremors or shaking, irritability Infusion reactions--chest pain, shortness of breath or trouble breathing, feeling faint or lightheaded Kidney injury (glomerulonephritis)--decrease in the amount of urine, red or dark brown urine, foamy or bubbly urine, swelling of the ankles, hands, or feet Liver injury--right upper belly pain, loss of appetite, nausea, light-colored stool, dark yellow or brown urine, yellowing skin or eyes, unusual weakness or fatigue Pain, tingling, or numbness in the hands or feet, muscle weakness, change in vision, confusion or trouble speaking, loss of balance or coordination, trouble walking, seizures Rash,  fever, and swollen lymph nodes Redness, blistering, peeling, or loosening of the skin, including inside the mouth Sudden or severe stomach pain, bloody diarrhea, fever, nausea, vomiting Side effects that usually do not require medical attention (report to your doctor or health care professional if they continue or are bothersome): Bone, joint, or muscle pain Diarrhea Fatigue Loss of appetite Nausea Skin rash This list may not describe all possible side effects. Call your doctor for medical advice about side effects. You may report side effects to FDA at 1-800-FDA-1088. Where should I keep my medication? This medication is given in a hospital or clinic. It will not be stored at home. NOTE: This sheet is a summary. It may not cover all possible information. If you have questions about this medicine, talk to your doctor, pharmacist, or health care provider.  2023 Elsevier/Gold Standard (2022-02-13 00:00:00)  Paclitaxel Injection What is this medication? PACLITAXEL (PAK li TAX el) treats some types of cancer. It works by slowing down  the growth of cancer cells. This medicine may be used for other purposes; ask your health care provider or pharmacist if you have questions. COMMON BRAND NAME(S): Onxol, Taxol What should I tell my care team before I take this medication? They need to know if you have any of these conditions: Heart disease Liver disease Low white blood cell levels An unusual or allergic reaction to paclitaxel, other medications, foods, dyes, or preservatives If you or your partner are pregnant or trying to get pregnant Breast-feeding How should I use this medication? This medication is injected into a vein. It is given by your care team in a hospital or clinic setting. Talk to your care team about the use of this medication in children. While it may be given to children for selected conditions, precautions do apply. Overdosage: If you think you have taken too much of this  medicine contact a poison control center or emergency room at once. NOTE: This medicine is only for you. Do not share this medicine with others. What if I miss a dose? Keep appointments for follow-up doses. It is important not to miss your dose. Call your care team if you are unable to keep an appointment. What may interact with this medication? Do not take this medication with any of the following: Live virus vaccines Other medications may affect the way this medication works. Talk with your care team about all of the medications you take. They may suggest changes to your treatment plan to lower the risk of side effects and to make sure your medications work as intended. This list may not describe all possible interactions. Give your health care provider a list of all the medicines, herbs, non-prescription drugs, or dietary supplements you use. Also tell them if you smoke, drink alcohol, or use illegal drugs. Some items may interact with your medicine. What should I watch for while using this medication? Your condition will be monitored carefully while you are receiving this medication. You may need blood work while taking this medication. This medication may make you feel generally unwell. This is not uncommon as chemotherapy can affect healthy cells as well as cancer cells. Report any side effects. Continue your course of treatment even though you feel ill unless your care team tells you to stop. This medication can cause serious allergic reactions. To reduce the risk, your care team may give you other medications to take before receiving this one. Be sure to follow the directions from your care team. This medication may increase your risk of getting an infection. Call your care team for advice if you get a fever, chills, sore throat, or other symptoms of a cold or flu. Do not treat yourself. Try to avoid being around people who are sick. This medication may increase your risk to bruise or bleed.  Call your care team if you notice any unusual bleeding. Be careful brushing or flossing your teeth or using a toothpick because you may get an infection or bleed more easily. If you have any dental work done, tell your dentist you are receiving this medication. Talk to your care team if you may be pregnant. Serious birth defects can occur if you take this medication during pregnancy. Talk to your care team before breastfeeding. Changes to your treatment plan may be needed. What side effects may I notice from receiving this medication? Side effects that you should report to your care team as soon as possible: Allergic reactions--skin rash, itching, hives, swelling of the face, lips,  tongue, or throat Heart rhythm changes--fast or irregular heartbeat, dizziness, feeling faint or lightheaded, chest pain, trouble breathing Increase in blood pressure Infection--fever, chills, cough, sore throat, wounds that don't heal, pain or trouble when passing urine, general feeling of discomfort or being unwell Low blood pressure--dizziness, feeling faint or lightheaded, blurry vision Low red blood cell level--unusual weakness or fatigue, dizziness, headache, trouble breathing Painful swelling, warmth, or redness of the skin, blisters or sores at the infusion site Pain, tingling, or numbness in the hands or feet Slow heartbeat--dizziness, feeling faint or lightheaded, confusion, trouble breathing, unusual weakness or fatigue Unusual bruising or bleeding Side effects that usually do not require medical attention (report to your care team if they continue or are bothersome): Diarrhea Hair loss Joint pain Loss of appetite Muscle pain Nausea Vomiting This list may not describe all possible side effects. Call your doctor for medical advice about side effects. You may report side effects to FDA at 1-800-FDA-1088. Where should I keep my medication? This medication is given in a hospital or clinic. It will not be  stored at home. NOTE: This sheet is a summary. It may not cover all possible information. If you have questions about this medicine, talk to your doctor, pharmacist, or health care provider.  2023 Elsevier/Gold Standard (2022-01-31 00:00:00)  Carboplatin Injection What is this medication? CARBOPLATIN (KAR boe pla tin) treats some types of cancer. It works by slowing down the growth of cancer cells. This medicine may be used for other purposes; ask your health care provider or pharmacist if you have questions. COMMON BRAND NAME(S): Paraplatin What should I tell my care team before I take this medication? They need to know if you have any of these conditions: Blood disorders Hearing problems Kidney disease Recent or ongoing radiation therapy An unusual or allergic reaction to carboplatin, cisplatin, other medications, foods, dyes, or preservatives Pregnant or trying to get pregnant Breast-feeding How should I use this medication? This medication is injected into a vein. It is given by your care team in a hospital or clinic setting. Talk to your care team about the use of this medication in children. Special care may be needed. Overdosage: If you think you have taken too much of this medicine contact a poison control center or emergency room at once. NOTE: This medicine is only for you. Do not share this medicine with others. What if I miss a dose? Keep appointments for follow-up doses. It is important not to miss your dose. Call your care team if you are unable to keep an appointment. What may interact with this medication? Medications for seizures Some antibiotics, such as amikacin, gentamicin, neomycin, streptomycin, tobramycin Vaccines This list may not describe all possible interactions. Give your health care provider a list of all the medicines, herbs, non-prescription drugs, or dietary supplements you use. Also tell them if you smoke, drink alcohol, or use illegal drugs. Some items  may interact with your medicine. What should I watch for while using this medication? Your condition will be monitored carefully while you are receiving this medication. You may need blood work while taking this medication. This medication may make you feel generally unwell. This is not uncommon, as chemotherapy can affect healthy cells as well as cancer cells. Report any side effects. Continue your course of treatment even though you feel ill unless your care team tells you to stop. In some cases, you may be given additional medications to help with side effects. Follow all directions for their use.  This medication may increase your risk of getting an infection. Call your care team for advice if you get a fever, chills, sore throat, or other symptoms of a cold or flu. Do not treat yourself. Try to avoid being around people who are sick. Avoid taking medications that contain aspirin, acetaminophen, ibuprofen, naproxen, or ketoprofen unless instructed by your care team. These medications may hide a fever. Be careful brushing or flossing your teeth or using a toothpick because you may get an infection or bleed more easily. If you have any dental work done, tell your dentist you are receiving this medication. Talk to your care team if you wish to become pregnant or think you might be pregnant. This medication can cause serious birth defects. Talk to your care team about effective forms of contraception. Do not breast-feed while taking this medication. What side effects may I notice from receiving this medication? Side effects that you should report to your care team as soon as possible: Allergic reactions--skin rash, itching, hives, swelling of the face, lips, tongue, or throat Infection--fever, chills, cough, sore throat, wounds that don't heal, pain or trouble when passing urine, general feeling of discomfort or being unwell Low red blood cell level--unusual weakness or fatigue, dizziness, headache,  trouble breathing Pain, tingling, or numbness in the hands or feet, muscle weakness, change in vision, confusion or trouble speaking, loss of balance or coordination, trouble walking, seizures Unusual bruising or bleeding Side effects that usually do not require medical attention (report to your care team if they continue or are bothersome): Hair loss Nausea Unusual weakness or fatigue Vomiting This list may not describe all possible side effects. Call your doctor for medical advice about side effects. You may report side effects to FDA at 1-800-FDA-1088. Where should I keep my medication? This medication is given in a hospital or clinic. It will not be stored at home. NOTE: This sheet is a summary. It may not cover all possible information. If you have questions about this medicine, talk to your doctor, pharmacist, or health care provider.  2023 [employer] (2022-01-15 00:00:00)

## 2022-08-17 NOTE — Telephone Encounter (Signed)
Liver biopsy scheduled for 08/23/2022 at 10 :00 am with arrival time of 9:00.  Patient notified of bx appt and that IR nurse will be contacting him to review specific instructions.  Dr. Jacinto Reap was gives medical clearance for patient to hold Eliquis for 3 days which would be 11/7, 11/8, 11/9 and resume on 11/10.  Written instructions given to patient with Eliquis hold plan.

## 2022-08-20 ENCOUNTER — Inpatient Hospital Stay: Payer: Managed Care, Other (non HMO)

## 2022-08-20 ENCOUNTER — Ambulatory Visit: Payer: Managed Care, Other (non HMO)

## 2022-08-20 ENCOUNTER — Encounter: Payer: Self-pay | Admitting: *Deleted

## 2022-08-20 ENCOUNTER — Encounter: Payer: Self-pay | Admitting: Internal Medicine

## 2022-08-20 DIAGNOSIS — C3432 Malignant neoplasm of lower lobe, left bronchus or lung: Secondary | ICD-10-CM | POA: Diagnosis not present

## 2022-08-20 MED ORDER — PEGFILGRASTIM INJECTION 6 MG/0.6ML ~~LOC~~
6.0000 mg | PREFILLED_SYRINGE | Freq: Once | SUBCUTANEOUS | Status: AC
  Administered 2022-08-20: 6 mg via SUBCUTANEOUS
  Filled 2022-08-20: qty 0.6

## 2022-08-20 NOTE — Progress Notes (Signed)
Patient and his wife presents to clinic for teaching on self admin for neulasta injection. Demonstrated procedure with wife with current order for neulasta. Teach back process performed. Patient stated that he would be reciving his neulasta via mail in the days ahead.

## 2022-08-20 NOTE — Patient Instructions (Signed)
The loratadine dose to prevent bone pain from Neulasta. To prevent the boney aches, please take  1 loratadine 10mg  tablet daily, in the morning for 5 days, starting on the day Neulasta is given.

## 2022-08-20 NOTE — Progress Notes (Signed)
Patient for US guided Liver Biopsy on Thurs 08/23/2022, I called and spoke with the patient on the phone and gave pre-procedure instructions. Pt was made aware to be here at [place] at the new entrance, the last dose of Eliquis is Mon 08/20/22, NPO after MN prior to procedure as well as driver post procedure/recovery/discharge. Pt stated understanding.  Called Monday 08/20/2022

## 2022-08-20 NOTE — Telephone Encounter (Signed)
Neulasta PA approved effective 08/17/22 through 08/20/23 (authorization scanned in chart)

## 2022-08-21 ENCOUNTER — Encounter: Payer: Self-pay | Admitting: Internal Medicine

## 2022-08-21 ENCOUNTER — Encounter: Payer: Self-pay | Admitting: Nurse Practitioner

## 2022-08-21 ENCOUNTER — Inpatient Hospital Stay: Payer: Managed Care, Other (non HMO)

## 2022-08-22 ENCOUNTER — Inpatient Hospital Stay (HOSPITAL_BASED_OUTPATIENT_CLINIC_OR_DEPARTMENT_OTHER): Payer: Managed Care, Other (non HMO) | Admitting: Medical Oncology

## 2022-08-22 ENCOUNTER — Other Ambulatory Visit: Payer: Self-pay | Admitting: Student

## 2022-08-22 ENCOUNTER — Inpatient Hospital Stay: Payer: Managed Care, Other (non HMO)

## 2022-08-22 ENCOUNTER — Telehealth: Payer: Self-pay | Admitting: *Deleted

## 2022-08-22 VITALS — BP 119/77 | HR 76 | Resp 16 | Wt 219.4 lb

## 2022-08-22 VITALS — BP 128/85 | HR 83 | Temp 98.5°F | Wt 219.4 lb

## 2022-08-22 DIAGNOSIS — C7951 Secondary malignant neoplasm of bone: Secondary | ICD-10-CM

## 2022-08-22 DIAGNOSIS — R7989 Other specified abnormal findings of blood chemistry: Secondary | ICD-10-CM | POA: Diagnosis not present

## 2022-08-22 DIAGNOSIS — C3432 Malignant neoplasm of lower lobe, left bronchus or lung: Secondary | ICD-10-CM | POA: Diagnosis not present

## 2022-08-22 DIAGNOSIS — C7931 Secondary malignant neoplasm of brain: Secondary | ICD-10-CM

## 2022-08-22 DIAGNOSIS — I82441 Acute embolism and thrombosis of right tibial vein: Secondary | ICD-10-CM | POA: Diagnosis not present

## 2022-08-22 DIAGNOSIS — C787 Secondary malignant neoplasm of liver and intrahepatic bile duct: Secondary | ICD-10-CM

## 2022-08-22 DIAGNOSIS — E871 Hypo-osmolality and hyponatremia: Secondary | ICD-10-CM

## 2022-08-22 DIAGNOSIS — Z95828 Presence of other vascular implants and grafts: Secondary | ICD-10-CM

## 2022-08-22 LAB — COMPREHENSIVE METABOLIC PANEL
ALT: 121 U/L — ABNORMAL HIGH (ref 0–44)
AST: 97 U/L — ABNORMAL HIGH (ref 15–41)
Albumin: 3.3 g/dL — ABNORMAL LOW (ref 3.5–5.0)
Alkaline Phosphatase: 300 U/L — ABNORMAL HIGH (ref 38–126)
Anion gap: 6 (ref 5–15)
BUN: 14 mg/dL (ref 6–20)
CO2: 24 mmol/L (ref 22–32)
Calcium: 8.5 mg/dL — ABNORMAL LOW (ref 8.9–10.3)
Chloride: 101 mmol/L (ref 98–111)
Creatinine, Ser: 0.89 mg/dL (ref 0.61–1.24)
GFR, Estimated: >60 mL/min (ref >60)
Glucose, Bld: 123 mg/dL — ABNORMAL HIGH (ref 70–99)
Potassium: 4.2 mmol/L (ref 3.5–5.1)
Sodium: 131 mmol/L — ABNORMAL LOW (ref 135–145)
Total Bilirubin: 1.2 mg/dL (ref 0.3–1.2)
Total Protein: 6.9 g/dL (ref 6.5–8.1)

## 2022-08-22 LAB — CBC WITH DIFFERENTIAL/PLATELET
Abs Immature Granulocytes: 0.1 10*3/uL — ABNORMAL HIGH (ref 0.00–0.07)
Basophils Absolute: 0 10*3/uL (ref 0.0–0.1)
Basophils Relative: 1 %
Eosinophils Absolute: 0.1 10*3/uL (ref 0.0–0.5)
Eosinophils Relative: 4 %
HCT: 43.3 % (ref 39.0–52.0)
Hemoglobin: 14.3 g/dL (ref 13.0–17.0)
Immature Granulocytes: 5 %
Lymphocytes Relative: 13 %
Lymphs Abs: 0.3 10*3/uL — ABNORMAL LOW (ref 0.7–4.0)
MCH: 28.4 pg (ref 26.0–34.0)
MCHC: 33 g/dL (ref 30.0–36.0)
MCV: 85.9 fL (ref 80.0–100.0)
Monocytes Absolute: 0.1 10*3/uL (ref 0.1–1.0)
Monocytes Relative: 3 %
Neutro Abs: 1.7 10*3/uL (ref 1.7–7.7)
Neutrophils Relative %: 74 %
Platelets: 62 10*3/uL — ABNORMAL LOW (ref 150–400)
RBC: 5.04 MIL/uL (ref 4.22–5.81)
RDW: 13.1 % (ref 11.5–15.5)
WBC: 2.2 10*3/uL — ABNORMAL LOW (ref 4.0–10.5)
nRBC: 0 % (ref 0.0–0.2)

## 2022-08-22 MED ORDER — TRAMADOL HCL 50 MG PO TABS: 50.0000 mg | ORAL_TABLET | Freq: Four times a day (QID) | ORAL | 0 refills | Status: DC | PRN

## 2022-08-22 MED ORDER — HEPARIN SOD (PORK) LOCK FLUSH 100 UNIT/ML IV SOLN
500.0000 [IU] | Freq: Once | INTRAVENOUS | Status: AC
  Filled 2022-08-22: qty 5

## 2022-08-22 MED ORDER — HEPARIN SOD (PORK) LOCK FLUSH 100 UNIT/ML IV SOLN
500.0000 [IU] | Freq: Once | INTRAVENOUS | Status: AC
  Administered 2022-08-22: 500 [IU] via INTRAVENOUS
  Filled 2022-08-22: qty 5

## 2022-08-22 MED ORDER — SODIUM CHLORIDE 0.9% FLUSH
10.0000 mL | Freq: Once | INTRAVENOUS | Status: AC
  Filled 2022-08-22: qty 10

## 2022-08-22 MED ORDER — SODIUM CHLORIDE 0.9 % IV SOLN
Freq: Once | INTRAVENOUS | Status: AC
  Filled 2022-08-22: qty 250

## 2022-08-22 MED ORDER — SODIUM CHLORIDE 0.9% FLUSH
10.0000 mL | Freq: Once | INTRAVENOUS | Status: AC
  Administered 2022-08-22: 10 mL via INTRAVENOUS
  Filled 2022-08-22: qty 10

## 2022-08-22 NOTE — Progress Notes (Signed)
[place] NOTE  Patient Care Team: Pcp, No as PCP - General [name], RN as Oncology Nurse Navigator [name], MD as Consulting Physician (Internal Medicine)  CHIEF COMPLAINTS/PURPOSE OF CONSULTATION: Lung cancer  #  Oncology History Overview Note  IMPRESSION: 1. Patchy nodular fat stranding throughout the anterior left upper quadrant peritoneal fat, nonspecific, cannot exclude peritoneal carcinomatosis. Dedicated CT abdomen/pelvis with oral and IV contrast recommended for further evaluation. 2. Dense patchy consolidation replacing much of the left lower lung lobe, appearing masslike in the superior segment left lower lobe, with associated bulging of the left major fissure and associated left lower lobe volume loss. Fine nodularity throughout both lungs with an upper lobe predominance. Asymmetric left upper lobe interlobular septal thickening. These findings are indeterminate, with differential including multilobar pneumonia, sarcoidosis or a neoplastic process. The persistence on radiographs back to 01/20/2021 despite antibiotic therapy make sarcoidosis or a neoplastic process more likely. Pulmonology consultation suggested for consideration of bronchoscopic evaluation. 3. Small dependent left pleural effusion. 4. Mild mediastinal lymphadenopathy, nonspecific. 5. Subacute healing lateral right sixth rib fracture.   DIAGNOSIS:  A. LUNG, LEFT LOWER LOBE; ENB-ASSISTED BIOPSY:  - NON-SMALL CELL CARCINOMA, FAVOR ADENOCARCINOMA.  - FOREIGN MATERIAL SUGGESTIVE OF ASPIRATION.   # EGFR MUTATED: 40 del- June 28th, 2022- [name]. [limited]  # Asymptomatic multiple brain mets subcentimeter asymptomatic -JUNE 29th, 2023- Numerous metastatic lesions in the supratentorial brain-increase in number also conspicuity compared to January 2023.  Pre-SBRT MRI brain AUG 18th, 2023-  Eighteen small enhancing brain metastases (punctate to 11 mm); were all  present on 03/28/2021-s/p SBRT SEP 2023- [[place]]  # SEP-OCT-worsening left-sided pleural effusion-status post paracentesis; exudative; Cytology negative- ?  Progressive disease..   Cancer of lower lobe of left lung ([place])  03/23/2021 Initial Diagnosis   Cancer of lower lobe of left lung ([place])   03/29/2021 Cancer Staging   Staging form: Lung, AJCC 8th Edition - Clinical: Stage IVB (cT3, cN3, pM1c) - Signed by [name], MD on 03/29/2021   03/30/2021 - 03/30/2021 Chemotherapy   Patient is on Treatment Plan : LUNG NSCLC Pemetrexed ([place]) / Carboplatin q21d x 1 cycles     08/17/2022 -  Chemotherapy   Patient is on Treatment Plan : LUNG Atezolizumab + Bevacizumab + Carboplatin + Paclitaxel q21d Induction x 4 cycles / Atezolizumab + Bevacizumab q21d Maintenance      HISTORY OF PRESENTING ILLNESS: Ambulating independently.  With wife.   [name] 41 y.o.  male patient with stage IV lung cancer adenocarcinoma-brain mets [synchronous] bone mets EGFR mutated on osimertinib is here for follow-up/review results of the PET scan/Brain MRI.  Osimertinib was discontinued approximately 1 week ago because of elevated LFTs. Had one cycle of  Chemo [carbo-Taxol-Atezo+Bev] on 08/17/2022 with Neulasta on 08/20/2022.   Today he reports stating that he has had fatigue and ankle pain since Monday following his Neulasta. No known injury. Tried tylenol last night due to severity of pain and pain has fully resolved. Fatigue has continues. No fevers, history of gout, weakness. No cough. No dysuria or exposure to COVID-19.   No headaches, nausea, vomiting, constipation.   Wt Readings from Last 3 Encounters:  08/22/22 219 lb 6.4 oz (99.5 kg)  08/22/22 219 lb 6.4 oz (99.5 kg)  08/16/22 224 lb 4.8 oz (101.7 kg)    Review of Systems  Constitutional:  Positive for malaise/fatigue. Negative for chills, diaphoresis and fever.  HENT:  Negative for nosebleeds and sore throat.  Eyes:  Negative for double  vision.  Respiratory:  Negative for cough and wheezing.   Cardiovascular:  Negative for chest pain, palpitations, orthopnea and leg swelling.  Gastrointestinal:  Negative for abdominal pain, blood in stool, diarrhea, heartburn, melena and nausea.  Genitourinary:  Negative for dysuria, frequency and urgency.  Musculoskeletal:  Negative for back pain and joint pain.  Skin:  Negative for itching.  Neurological:  Positive for weakness. Negative for dizziness, tingling, focal weakness and headaches.  Endo/Heme/Allergies:  Does not bruise/bleed easily.  Psychiatric/Behavioral:  Negative for depression. The patient is not nervous/anxious and does not have insomnia.      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    SURGICAL HISTORY: Past Surgical History:  Procedure Laterality Date   FRACTURE SURGERY     broke femur when he was 6 years   PORTA CATH INSERTION N/A 03/28/2021   Procedure: PORTA CATH INSERTION;  Surgeon: Katha Cabal, MD;  Location: [facility];  Service: Cardiovascular;  Laterality: N/A;   VIDEO BRONCHOSCOPY WITH ENDOBRONCHIAL NAVIGATION N/A 03/15/2021   Procedure: VIDEO BRONCHOSCOPY WITH ENDOBRONCHIAL NAVIGATION;  Surgeon: Ottie Glazier, MD;  Location: [facility];  Service: Thoracic;  Laterality: N/A;   VIDEO BRONCHOSCOPY WITH ENDOBRONCHIAL ULTRASOUND N/A 03/15/2021   Procedure: VIDEO BRONCHOSCOPY WITH ENDOBRONCHIAL ULTRASOUND;  Surgeon: Ottie Glazier, MD;  Location: [facility];  Service: Thoracic;  Laterality: N/A;    SOCIAL HISTORY: Social History   Socioeconomic History   Marital status: Married    Spouse name: [name]   Number of children: 2   Years of education: Not on file   Highest education level: Not on file  Occupational History   Not on file  Tobacco Use   Smoking status: Never   Smokeless tobacco: Current    Types:  Snuff, Chew  Vaping Use   Vaping Use: Never used  Substance and Sexual Activity   Alcohol use: Never   Drug use: Never   Sexual activity: Not on file  Other Topics Concern   Not on file  Social History Narrative   [place]; with wife; 2 daughters[12 and 22]; never smoked; rare alcohol. Work in saw [employer]    Social Determinants of Health   Financial Resource Strain: Low Risk  (05/10/2022)   Overall Financial Resource Strain (CARDIA)    Difficulty of Paying Living Expenses: Not hard at all  Food Insecurity: No Food Insecurity (05/10/2022)   Hunger Vital Sign    Worried About Running Out of Food in the Last Year: Never true    Ran Out of Food in the Last Year: Never true  Transportation Needs: No Transportation Needs (03/16/2022)   PRAPARE - Hydrologist (Medical): No    Lack of Transportation (Non-Medical): No  Physical Activity: Not on file  Stress: Not on file  Social Connections: Socially Integrated (05/10/2022)   Social Connection and Isolation Panel [NHANES]    Frequency of Communication with Friends and Family: More than three times a week    Frequency of Social Gatherings with Friends and Family: More than three times a week    Attends Religious Services: More than 4 times per year    Active Member of [employer] or Organizations: Yes    Attends Archivist Meetings: More than 4 times  per year    Marital Status: Married  Human resources officer Violence: Not on file    FAMILY HISTORY: Family History  Problem Relation Age of Onset   High Cholesterol Mother    Hypertension Father    Cancer Father    Lung cancer Father    Skin cancer Father     ALLERGIES:  is allergic to codeine.  MEDICATIONS:  Current Outpatient Medications  Medication Sig Dispense Refill   apixaban (ELIQUIS) 5 MG TABS tablet Take 1 tablet (5 mg total) by mouth 2 (two) times daily. 60 tablet 2   calcium carbonate (TUMS EX) 750 MG chewable tablet Chew 1 tablet by  mouth daily.     dronabinol (MARINOL) 2.5 MG capsule Take 1 capsule (2.5 mg total) by mouth 2 (two) times daily before lunch and supper. 60 capsule 0   folic acid (FOLVITE) 1 MG tablet Take 1 tablet (1 mg total) by mouth daily. 90 tablet 1   lidocaine-prilocaine (EMLA) cream Apply 1 application topically as needed. 30 g 0   metoprolol succinate (TOPROL-XL) 50 MG 24 hr tablet Take 50 mg by mouth daily. Take with or immediately following a meal.     omeprazole (PRILOSEC) 20 MG capsule Take 1 capsule (20 mg total) by mouth daily. 30 capsule 2   ondansetron (ZOFRAN) 8 MG tablet One pill every 8 hours as needed for nausea/vomitting. 40 tablet 1   prochlorperazine (COMPAZINE) 10 MG tablet Take 1 tablet (10 mg total) by mouth every 6 (six) hours as needed for nausea or vomiting. 40 tablet 1   sertraline (ZOLOFT) 50 MG tablet TAKE 1/2 TABLET BY MOUTH DAILY 30 tablet 2   TAGRISSO 80 MG tablet TAKE 1 TABLET DAILY 30 tablet 4   traMADol (ULTRAM) 50 MG tablet Take 1 tablet (50 mg total) by mouth every 6 (six) hours as needed for up to 7 days for severe pain. 28 tablet 0   No current facility-administered medications for this visit.   Facility-Administered Medications Ordered in Other Visits  Medication Dose Route Frequency Provider Last Rate Last Admin   heparin lock flush 100 UNIT/ML injection            heparin lock flush 100 UNIT/ML injection            sodium chloride flush (NS) 0.9 % injection 10 mL  10 mL Intravenous PRN [name], MD   10 mL at 07/28/21 0852      .  PHYSICAL EXAMINATION: ECOG PERFORMANCE STATUS: 1 - Symptomatic but completely ambulatory  Vitals:   08/22/22 1041  BP: 128/85  Pulse: 83  Temp: 98.5 F (36.9 C)      Filed Weights   08/22/22 1041  Weight: 219 lb 6.4 oz (99.5 kg)    Physical Exam Vitals and nursing note reviewed.  Constitutional:      General: He is not in acute distress.    Appearance: He is not toxic-appearing or diaphoretic.      Comments: Laying on examination table  HENT:     Head: Normocephalic and atraumatic.     Mouth/Throat:     Pharynx: No oropharyngeal exudate.  Eyes:     Pupils: Pupils are equal, round, and reactive to light.  Cardiovascular:     Rate and Rhythm: Normal rate and regular rhythm.  Pulmonary:     Effort: No respiratory distress.     Breath sounds: No wheezing.     Comments: Decreased breath sound on the left side compared  to right. Abdominal:     General: Bowel sounds are normal. There is no distension.     Palpations: Abdomen is soft. There is no mass.     Tenderness: There is no abdominal tenderness. There is no guarding or rebound.  Musculoskeletal:        General: No tenderness. Normal range of motion.     Cervical back: Normal range of motion and neck supple.     Comments: No erythema, increased warmth or tenderness of bilateral ankles   Skin:    General: Skin is warm.     Coloration: Skin is not jaundiced or pale.  Neurological:     Mental Status: He is alert and oriented to person, place, and time.  Psychiatric:        Mood and Affect: Affect normal.    Fungal dermatitis bilateral buttocks groin/underneath abdominal pannus  LABORATORY DATA:  I have reviewed the data as listed Lab Results  Component Value Date   WBC 2.2 (L) 08/22/2022   HGB 14.3 08/22/2022   HCT 43.3 08/22/2022   MCV 85.9 08/22/2022   PLT 62 (L) 08/22/2022   Recent Labs    08/10/22 1124 08/14/22 1332 08/22/22 1012  NA 134* 135 131*  K 3.8 3.9 4.2  CL 101 101 101  CO2 _0 GLUCOSE 94 84 123*  BUN _1 CREATININE 1.21 1.22 0.89  CALCIUM 8.5* 8.6* 8.5*  GFRNONAA >60 >60 >60  PROT 7.6 7.7 6.9  ALBUMIN 3.6 3.7 3.3*  AST 100* 143* 97*  ALT 110* 148* 121*  ALKPHOS 266* 334* 300*  BILITOT 0.8 0.6 1.2    RADIOGRAPHIC STUDIES: I have personally reviewed the radiological images as listed and agreed with the findings in the report. NM PET Image Restage (PS) Skull Base to Thigh (F-18  FDG)  Result Date: 08/16/2022 CLINICAL DATA:  Subsequent treatment strategy for recurrent non-small cell lung cancer. EXAM: NUCLEAR MEDICINE PET SKULL BASE TO THIGH TECHNIQUE: 12.26 mCi F-18 FDG was injected intravenously. Full-ring PET imaging was performed from the skull base to thigh after the radiotracer. CT data was obtained and used for attenuation correction and anatomic localization. Fasting blood glucose: 90 mg/dl COMPARISON:  PET-CT from 01/31/2022 FINDINGS: Mediastinal blood pool activity: SUV max 1.95 Liver activity: SUV max NA NECK: No hypermetabolic lymph nodes in the neck. Incidental CT findings: None. CHEST: No tracer avid axillary, supraclavicular, or mediastinal adenopathy. There is a large loculated left pleural effusion which appears similar in volume to the previous exam. There is overlying mild increased tracer uptake within the overlying pleural with SUV max of 1.89 on today's study versus 3.25 previously. Central left midlung tracer avid lung nodule is identified just lateral to the descending aorta measuring 1.1 cm with SUV max of 7.37. There is a tiny nodule in the right upper lobe measuring 6 mm which is too small to reliably characterize, image 82/2. Unchanged subpleural nodule in the right middle lobe measuring 6 mm, also too small to characterize, image 115/2. Progressive interlobular septal thickening is identified within the right middle lobe and right lower lobe. Incidental CT findings: Mild cardiac enlargement. No pericardial effusion. ABDOMEN/PELVIS: Multiple ill-defined foci of abnormal increased radiotracer uptake identified within both lobes of liver, new compared with the previous exam: Index lesions include: Within the lateral segment of left hepatic lobe there is a focal area of low-density measuring 5 x 3.6 cm with SUV max of 7.19, image 133/2. Within the inferior right lobe  of liver there is an ill-defined area of low attenuation measuring 5.9 x 3.0 cm with SUV max of  9.12, image 162/2. Within the lateral aspect posterior right lobe of liver there is a focal area of increased uptake measuring approximately 3.26 cm with SUV max of 9.49. Margins of this lesion are difficult to visualize on the unenhanced CT images. Portacaval lymph node is tracer avid measuring 1 cm with SUV max of 4.86, image 151/2. New from previous exam Adjacent porta hepatic node measures 1.1 cm with SUV max of 4.51, image 147/2. New from previous exam. Mildly tracer avid aortocaval lymph node measures 7 mm with SUV max of 3.12. New from the previous exam. No tracer avid pelvic or inguinal lymph nodes. New small volume of ascites identified within the right iliac fossa and central pelvis with mild adjacent thickening of the peritoneal reflections. No tracer avid peritoneal nodule identified. Incidental CT findings: None. SKELETON: No focal hypermetabolic activity to suggest skeletal metastasis. Incidental CT findings: None. IMPRESSION: 1. Interval development of multifocal tracer avid liver lesions compatible with metastatic disease. 2. New tracer avid upper abdominal lymph nodes compatible with nodal metastasis. 3. New tracer avid nodule within the central left midlung compatible with concerning for locally recurrent tumor or metastatic disease. 4. Similar appearance of large loculated left pleural effusion with mild overlying tracer uptake. 5. New small volume of ascites within the right iliac fossa and central pelvis with mild adjacent thickening of the peritoneal reflections. No tracer avid peritoneal nodule identified. Correlate for any clinical signs or symptoms of early peritoneal disease. 6. Progressive interlobular septal thickening within the right middle lobe and right lower lobe. Findings are nonspecific and may reflect asymmetric pulmonary edema. Lymphangitic spread of tumor is not excluded. Electronically Signed   By: Kerby Moors M.D.   On: 08/16/2022 09:11   MR Brain W Wo Contrast  Result  Date: 08/16/2022 CLINICAL DATA:  Non-small-cell lung cancer. 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 Assess response to treatment. EXAM: MRI HEAD WITHOUT AND WITH CONTRAST TECHNIQUE: Multiplanar, multiecho pulse sequences of the brain and surrounding structures were obtained without and with intravenous contrast. CONTRAST:  69m GADAVIST GADOBUTROL 1 MMOL/ML IV SOLN COMPARISON:  MRI head 05/29/2022 FINDINGS: Brain: Numerous small enhancing lesions in the brain compatible with widespread metastatic disease. Identified lesions are stable to smaller. No progression in size and no new lesions identified. There is however increased edema in the left posterior frontal white matter associated with metastatic disease. Mild sulcal FLAIR hyperintensity posteriorly has developed since the prior study. No abnormal enhancement. Ventricle size normal. No acute infarct. Mild hemorrhage associated with metastatic deposits in the right parietal lobe and left posterior frontal lobe and right medial frontal lobe unchanged. Vascular: Normal arterial flow voids. Skull and upper cervical spine: No skeletal metastasis. Sinuses/Orbits: Paranasal sinuses clear.  Negative orbit Other: None IMPRESSION: 1. Numerous small enhancing lesions in the brain compatible with metastatic disease. These are stable to smaller compared with the prior study. No new lesions identified. 2. There is increased edema in the left posterior frontal white matter associated with metastatic disease. 3. Mild sulcal FLAIR hyperintensity posteriorly has developed since the prior study. This could be due to subarachnoid hemorrhage, supplemental oxygen therapy, or possibly leptomeningeal carcinomatosis. Attention on follow-up. Electronically Signed   By: CFranchot GalloM.D.   On: 08/16/2022 09:10   [place]ORACENTESIS ASP PLEURAL SPACE W/IMG GUIDE  Result Date: 07/26/2022 INDICATION: Patient with history of left lower lung cancer, recurrent left pleural  effusion. Request is made  for diagnostic and therapeutic thoracentesis. EXAM: ULTRASOUND GUIDED DIAGNOSTIC THORACENTESIS MEDICATIONS: 15 mL 1% lidocaine COMPLICATIONS: None immediate. PROCEDURE: An ultrasound guided thoracentesis was thoroughly discussed with the patient and questions answered. The benefits, risks, alternatives and complications were also discussed. The patient understands and wishes to proceed with the procedure. Written consent was obtained. Ultrasound was performed to localize and mark an adequate pocket of fluid in the left chest. Fluid appeared thick and complex by ultrasound. The area was then prepped and draped in the normal sterile fashion. 1% Lidocaine was used for local anesthesia. Under ultrasound guidance a 6 Fr Safe-T-Centesis catheter was first introduced. Despite confirmation by imaging of being within the fluid collection a minimal amount of fluid was able to be aspirated. Dr. Anselm Pancoast to bedside and was able to access fluid pocket with a 7 cm Yueh. Thoracentesis was performed. The catheter was removed and a dressing applied. FINDINGS: A total of approximately 200 mL of dark, bloody fluid was removed. Samples were sent to the laboratory as requested by the clinical team. IMPRESSION: Successful ultrasound guided left thoracentesis yielding 200 mL of pleural fluid. Read by: Brynda Greathouse PA-C Electronically Signed   By: Markus Daft M.D.   On: 07/26/2022 15:06   DG Chest Port 1 View  Result Date: 07/26/2022 CLINICAL DATA:  Status post left thoracentesis EXAM: PORTABLE CHEST 1 VIEW COMPARISON:  04/18/2022.  CT 07/16/2022. FINDINGS: Large loculated left pleural effusion is again noted. No pneumothorax following thoracentesis. Airspace disease in the left lung likely reflects atelectasis. Right lung clear. Heart is normal size. No acute bony abnormality. IMPRESSION: Continued loculated large left pleural effusion.  No pneumothorax. Electronically Signed   By: Rolm Baptise M.D.   On: 07/26/2022 14:23     ASSESSMENT & PLAN:   Cancer of lower lobe of left lung ([place]) # STAGE IV- Left lower lobe lung cancer-adenocarcinoma the lung;  EGFR- 19 del POSITIVE. Currently on osimertinib 80 mg a day [osimertinib [since June 28th, 2022].  OCT 2nd, 2023- CT scan- Enlarging large complex thick walled left-sided pleural fluid collection.  Recommend biopsy liver for further evaluation./Repeat NGS testing. Liquid Biopsy/Guardant testing-Pending.  Discussed at tumor conference. Also discussed imaging at tumor conference. NOV 1st, 2023- PET scan- shows progressive disease in liver.  New tracer avid upper abdominal lymph nodes compatible with nodal metastasis; New tracer avid nodule within the central left midlung compatible with concerning for locally recurrent tumor or metastatic disease.; Similar appearance of large loculated left pleural effusion with mild overlying tracer uptake; New small volume of ascites within the right iliac fossa and central pelvis with mild adjacent thickening of the peritoneal reflections. Progressive interlobular septal thickening within the right middle lobe and right lower lobe. Findings are nonspecific and may reflect asymmetric pulmonary edema. Lymphangitic spread of tumor is not excluded. Liver biopsy is scheduled for tomorrow.  Chemo [carbo-Taxol-Atezo+Bev] has been beneficial in terms of reducing his abdominal pain and lowering his LFTs.    #Asymptomatic multiple brain mets subcentimeter asymptomatic -JUNE 29th, 2023- Numerous metastatic lesions in the supratentorial brain-increase in number also conspicuity compared to January 2023.  Pre-SBRT MRI brain AUG 18th, 2023-  Eighteen small enhancing brain metastases (punctate to 11 mm); were all present on 03/28/2021-s/p SBRT SEP 2023- [[place]].  Likely to be causing any worsening headaches.  Given progressive symptoms -Nov 1st- MRI brain: Today he reports no symptoms of concern in regards to this.    #Elevated LFTs:AST 97 down from 143,  ALT 121  down from 148, Alk Phos 300 from 334. Total Bilirubin is 1.2. His abdominal pain and elevated LFTs are improving on chemotherapy. Liver biopsy is planned for tomorrow. Advised on avoiding tylenol. See below regarding pain. We will continue to monitor his LFTs.    #Left lung PE [incidental on CT SEP 13th,2022-]?Symptomatic DVT of the right calf-on Eliquis-STABLE. No changes to plan today.    #Bone metastases- CT DEc 7th, 2022 to healing process.  Dental evaluation on 9/21.  Continue calcium vitamin D intake. calcium 8.1; ?  Osimertinib. HOLD zometa. No changes to this plan today.    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    # Continued hypocalcemia- Chronic in nature. Stable today.  Vitamin D levels normal.  June; July 2023 with repeat CK-within normal limits.  Monitor for now- No changes to this plan today.   #Hyponatremia- Acute on chronic. Likely secondary to disease process and decreased oral intake. IV fluids today. He will restart his appetite stimulant prescribed by Palliative care which should hopefully increase his oral intake which should improve this.    # IV access/poor access-: port flush.    # Neutropenia prevention- neulasta- with cycle #2. - Neulasta caused joint pains and fatigue. They asked for an alterative for pain as he is not able to use tylenol at this time. Discussed and reviewed tramadol for him to use PRN following Neulasta. PMPD reviewed. Rx sent. Palliative follow up recommended.    # DISPOSITION:    # Liver Biopsy scheduled for tomorrow.   #Palliative consult with [name] within 1 week. Sending in tramadol now.   # RTC Monday/Tuesday APP, labs (CBC w, CMP, mag) +-fluids   # Follow up on 11/27- MD; labs- cbc/cmp; CEA; UA; D-2 carbo-Taxol-Atezo+Bev   All questions were answered. The patient knows to call the clinic with any problems, questions or concerns.    Hughie Closs, PA-C 08/22/2022 1:16 PM

## 2022-08-22 NOTE — Telephone Encounter (Signed)
Spoke with pt's wife in the lobby and she has been made aware of results and recommendations. Pt is scheduled for follow up brain MRI 11/15/22 but instructed to call back if needs follow up imaging sooner due to any focal neurological deficits.

## 2022-08-22 NOTE — Telephone Encounter (Signed)
-----   Message from [name], MD sent at 08/21/2022  9:34 PM EST ----- Regarding: RE: recnet Brain MRI Thank you Sarah for reviewing the imaging and the update.  Melannie Metzner-please inform the patient of above impression/recommendations.  GB ----- Message ----- From: Eppie Gibson, MD Sent: 08/20/2022   8:53 AM EST To: Kirt Boys Borders, NP; Johney Maine, RN; # Subject: RE: recnet Brain MRI                           Laury Axon, we discussed him this AM after reviewing his brain MRI - we don't see any sign of obvious tumor progression in the brain, so we recommend continued surveillance w/ MRI q 3 mo.  [facility] D[facility] will arrange. If he develops focal neurologic deficits, please let us know and we can pursue imaging sooner.  Wishing you the best with your switch in systemic therapy. Sarah  ----- Message ----- From: [name], MD Sent: 08/17/2022   7:58 AM EST To: Eppie Gibson, MD; Irean Hong, NP; # Subject: recnet Brain MRI                               Good Morning Dr.Squire-I wanted to update you on the patient's recent developments.  Unfortunately patient had significant progressive disease especially in the liver.  I plan to switch systemic chemotherapy to chemo-immunotherapy which he is starting today.   Also plan to get a biopsy of the liver-, as I'm concerned re:  about possible "small cell transformation" given the rapidity/bulky disease.  I am also concerned of the same in the brain.  Appreciate if you can take a look at the brain MRI-and give me your thoughts.    It is difficult to assess patient's CNS symptoms-given his progressive disease in the liver.  However, clinically no evidence of any focal neurologic deficits at this time.   Glendy Barsanti-please update once we have the results of the liver biopsy.  Thanks GB  FYI-Dr.Vaslow

## 2022-08-23 ENCOUNTER — Ambulatory Visit
Admission: RE | Admit: 2022-08-23 | Discharge: 2022-08-23 | Disposition: A | Discharge status: Home / Self Care | Payer: Managed Care, Other (non HMO) | Source: Ambulatory Visit | Attending: Internal Medicine | Admitting: Internal Medicine

## 2022-08-23 ENCOUNTER — Telehealth: Payer: Self-pay | Admitting: *Deleted

## 2022-08-23 ENCOUNTER — Other Ambulatory Visit: Payer: Self-pay

## 2022-08-23 ENCOUNTER — Inpatient Hospital Stay: Payer: Managed Care, Other (non HMO)

## 2022-08-23 DIAGNOSIS — C787 Secondary malignant neoplasm of liver and intrahepatic bile duct: Secondary | ICD-10-CM | POA: Diagnosis present

## 2022-08-23 DIAGNOSIS — C3432 Malignant neoplasm of lower lobe, left bronchus or lung: Secondary | ICD-10-CM | POA: Diagnosis present

## 2022-08-23 DIAGNOSIS — K769 Liver disease, unspecified: Secondary | ICD-10-CM | POA: Insufficient documentation

## 2022-08-23 LAB — COMPREHENSIVE METABOLIC PANEL
ALT: 104 U/L — ABNORMAL HIGH (ref 0–44)
AST: 80 U/L — ABNORMAL HIGH (ref 15–41)
Albumin: 3.2 g/dL — ABNORMAL LOW (ref 3.5–5.0)
Alkaline Phosphatase: 319 U/L — ABNORMAL HIGH (ref 38–126)
Anion gap: 9 (ref 5–15)
BUN: 11 mg/dL (ref 6–20)
CO2: 25 mmol/L (ref 22–32)
Calcium: 8.5 mg/dL — ABNORMAL LOW (ref 8.9–10.3)
Chloride: 103 mmol/L (ref 98–111)
Creatinine, Ser: 0.76 mg/dL (ref 0.61–1.24)
GFR, Estimated: >60 mL/min (ref >60)
Glucose, Bld: 90 mg/dL (ref 70–99)
Potassium: 4.2 mmol/L (ref 3.5–5.1)
Sodium: 137 mmol/L (ref 135–145)
Total Bilirubin: 1.1 mg/dL (ref 0.3–1.2)
Total Protein: 6.7 g/dL (ref 6.5–8.1)

## 2022-08-23 LAB — CBC
HCT: 42 % (ref 39.0–52.0)
Hemoglobin: 13.9 g/dL (ref 13.0–17.0)
MCH: 28.1 pg (ref 26.0–34.0)
MCHC: 33.1 g/dL (ref 30.0–36.0)
MCV: 84.8 fL (ref 80.0–100.0)
Platelets: 43 10*3/uL — ABNORMAL LOW (ref 150–400)
RBC: 4.95 MIL/uL (ref 4.22–5.81)
RDW: 13.2 % (ref 11.5–15.5)
WBC: 0.6 10*3/uL — CL (ref 4.0–10.5)
nRBC: 0 % (ref 0.0–0.2)

## 2022-08-23 LAB — PROTIME-INR
INR: 1.2 (ref 0.8–1.2)
Prothrombin Time: 15.3 seconds — ABNORMAL HIGH (ref 11.4–15.2)

## 2022-08-23 MED ORDER — FENTANYL CITRATE (PF) 100 MCG/2ML IJ SOLN
INTRAMUSCULAR | Status: AC
  Filled 2022-08-23: qty 2

## 2022-08-23 MED ORDER — SODIUM CHLORIDE 0.9 % IV SOLN: INTRAVENOUS | Status: DC

## 2022-08-23 MED ORDER — MIDAZOLAM HCL 2 MG/2ML IJ SOLN
INTRAMUSCULAR | Status: AC
  Filled 2022-08-23: qty 2

## 2022-08-23 MED ORDER — HEPARIN SOD (PORK) LOCK FLUSH 100 UNIT/ML IV SOLN
INTRAVENOUS | Status: AC
  Filled 2022-08-23: qty 5

## 2022-08-23 NOTE — Progress Notes (Signed)
This radiology IR RN spoke with personnel in Lab who states plt ct 43, thus unable to do biopsy at this time per Dr Annamaria Boots, patient info given update per Reatha Armour prior to patient going back to his room. Will reach out to oncology clinic regarding inability to do biopsy at this time.

## 2022-08-23 NOTE — H&P (Signed)
Chief Complaint: Patient was seen in consultation today for image-guided liver biopsy of liver lesion  Referring Physician(s): [name]  Supervising Physician: Daryll Brod  Patient Status: [facility] - Out-pt  History of Present Illness: [name] is a 41 y.o. male with PMH significant for Wolff-Parkinson-White syndrome, lung cancer, and dyspnea being seen today for image-guided liver biopsy of new liver lesions. These lesions were visualized on NM PET scan on 08/15/22. The patient is being followed by oncology for cancer of lower lobe of left lung. Patient was referred to IR for biopsy of new liver lesions concerning for metastatic disease.  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   PORTA CATH INSERTION N/A 03/28/2021   Procedure: PORTA CATH INSERTION;  Surgeon: Katha Cabal, MD;  Location: [facility];  Service: Cardiovascular;  Laterality: N/A;   VIDEO BRONCHOSCOPY WITH ENDOBRONCHIAL NAVIGATION N/A 03/15/2021   Procedure: VIDEO BRONCHOSCOPY WITH ENDOBRONCHIAL NAVIGATION;  Surgeon: Ottie Glazier, MD;  Location: [facility];  Service: Thoracic;  Laterality: N/A;   VIDEO BRONCHOSCOPY WITH ENDOBRONCHIAL ULTRASOUND N/A 03/15/2021   Procedure: VIDEO BRONCHOSCOPY WITH ENDOBRONCHIAL ULTRASOUND;  Surgeon: Ottie Glazier, MD;  Location: [facility];  Service: Thoracic;  Laterality: N/A;    Allergies: Codeine  Medications: Prior to Admission medications   Medication Sig Start Date End Date Taking? Authorizing Provider  dronabinol (MARINOL) 2.5 MG capsule Take 1 capsule (2.5 mg total) by mouth 2 (two) times daily before lunch and supper. 08/14/22  Yes Borders, Kirt Boys, NP  folic acid (FOLVITE) 1 MG tablet Take 1 tablet (1 mg total) by mouth  daily. 04/05/22  Yes [name], MD  lidocaine-prilocaine (EMLA) cream Apply 1 application topically as needed. 03/23/21  Yes [name], MD  metoprolol succinate (TOPROL-XL) 50 MG 24 hr tablet Take 50 mg by mouth daily. Take with or immediately following a meal.   Yes [provider]  ondansetron (ZOFRAN) 8 MG tablet One pill every 8 hours as needed for nausea/vomitting. 08/10/22  Yes [name], MD  sertraline (ZOLOFT) 50 MG tablet TAKE 1/2 TABLET BY MOUTH DAILY 05/15/22  Yes [name], MD  traMADol (ULTRAM) 50 MG tablet Take 1 tablet (50 mg total) by mouth every 6 (six) hours as needed for up to 7 days for severe pain. 08/22/22 08/29/22 Yes Covington, Sarah M, PA-C  apixaban (ELIQUIS) 5 MG TABS tablet Take 1 tablet (5 mg total) by mouth 2 (two) times daily. 05/15/22   [name], MD  calcium carbonate (TUMS EX) 750 MG chewable tablet Chew 1 tablet by mouth daily.    [provider]  omeprazole (PRILOSEC) 20 MG capsule Take 1 capsule (20 mg total) by mouth daily. 08/14/22   Borders, Kirt Boys, NP  prochlorperazine (COMPAZINE) 10 MG tablet Take 1 tablet (10 mg total) by mouth every 6 (six) hours as needed for nausea or vomiting. 08/10/22   [name], MD  TAGRISSO 80 MG tablet TAKE 1 TABLET DAILY 05/22/22   [name], MD     Family History  Problem Relation Age of Onset   High Cholesterol Mother    Hypertension Father    Cancer Father  Lung cancer Father    Skin cancer Father     Social History   Socioeconomic History   Marital status: Married    Spouse name: [name]   Number of children: 2   Years of education: Not on file   Highest education level: Not on file  Occupational History   Not on file  Tobacco Use   Smoking status: Never   Smokeless tobacco: Former    Types: Snuff, Chew  Vaping Use   Vaping Use: Never used  Substance and Sexual Activity   Alcohol use: Never   Drug use: Never    Sexual activity: Not on file  Other Topics Concern   Not on file  Social History Narrative   [place]; with wife; 2 daughters[12 and 22]; never smoked; rare alcohol. Work in saw [employer]    Social Determinants of Health   Financial Resource Strain: Low Risk  (05/10/2022)   Overall Financial Resource Strain (CARDIA)    Difficulty of Paying Living Expenses: Not hard at all  Food Insecurity: No Food Insecurity (05/10/2022)   Hunger Vital Sign    Worried About Running Out of Food in the Last Year: Never true    Ran Out of Food in the Last Year: Never true  Transportation Needs: No Transportation Needs (03/16/2022)   PRAPARE - Hydrologist (Medical): No    Lack of Transportation (Non-Medical): No  Physical Activity: Not on file  Stress: Not on file  Social Connections: Socially Integrated (05/10/2022)   Social Connection and Isolation Panel [NHANES]    Frequency of Communication with Friends and Family: More than three times a week    Frequency of Social Gatherings with Friends and Family: More than three times a week    Attends Religious Services: More than 4 times per year    Active Member of [employer] or Organizations: Yes    Attends Music therapist: More than 4 times per year    Marital Status: Married     Review of Systems: A 12 point ROS discussed and pertinent positives are indicated in the HPI above.  All other systems are negative.  Review of Systems  Constitutional:  Positive for fatigue. Negative for chills and fever.  Respiratory:  Negative for chest tightness and shortness of breath.   Cardiovascular:  Negative for chest pain and leg swelling.  Gastrointestinal:  Negative for abdominal pain, diarrhea, nausea and vomiting.  Neurological:  Negative for dizziness and headaches.  Psychiatric/Behavioral:  Negative for confusion.     Vital Signs: BP (!) 120/95   Pulse 87   Temp 98.1 F (36.7 C) (Oral)   Resp 16   Ht 5\' 10"   (1.778 m)   Wt 219 lb 6.4 oz (99.5 kg)   SpO2 97%   BMI 31.48 kg/m     Physical Exam Vitals reviewed.  Constitutional:      General: He is not in acute distress. HENT:     Mouth/Throat:     Mouth: Mucous membranes are moist.  Cardiovascular:     Rate and Rhythm: Normal rate and regular rhythm.     Pulses: Normal pulses.     Heart sounds: Normal heart sounds.  Pulmonary:     Effort: Pulmonary effort is normal.     Breath sounds: Normal breath sounds.  Neurological:     Mental Status: He is alert.     Imaging: NM PET Image Restage (PS) Skull Base to Thigh (F-18  FDG)  Result Date: 08/16/2022 CLINICAL DATA:  Subsequent treatment strategy for recurrent non-small cell lung cancer. EXAM: NUCLEAR MEDICINE PET SKULL BASE TO THIGH TECHNIQUE: 12.26 mCi F-18 FDG was injected intravenously. Full-ring PET imaging was performed from the skull base to thigh after the radiotracer. CT data was obtained and used for attenuation correction and anatomic localization. Fasting blood glucose: 90 mg/dl COMPARISON:  PET-CT from 01/31/2022 FINDINGS: Mediastinal blood pool activity: SUV max 1.95 Liver activity: SUV max NA NECK: No hypermetabolic lymph nodes in the neck. Incidental CT findings: None. CHEST: No tracer avid axillary, supraclavicular, or mediastinal adenopathy. There is a large loculated left pleural effusion which appears similar in volume to the previous exam. There is overlying mild increased tracer uptake within the overlying pleural with SUV max of 1.89 on today's study versus 3.25 previously. Central left midlung tracer avid lung nodule is identified just lateral to the descending aorta measuring 1.1 cm with SUV max of 7.37. There is a tiny nodule in the right upper lobe measuring 6 mm which is too small to reliably characterize, image 82/2. Unchanged subpleural nodule in the right middle lobe measuring 6 mm, also too small to characterize, image 115/2. Progressive interlobular septal thickening  is identified within the right middle lobe and right lower lobe. Incidental CT findings: Mild cardiac enlargement. No pericardial effusion. ABDOMEN/PELVIS: Multiple ill-defined foci of abnormal increased radiotracer uptake identified within both lobes of liver, new compared with the previous exam: Index lesions include: Within the lateral segment of left hepatic lobe there is a focal area of low-density measuring 5 x 3.6 cm with SUV max of 7.19, image 133/2. Within the inferior right lobe of liver there is an ill-defined area of low attenuation measuring 5.9 x 3.0 cm with SUV max of 9.12, image 162/2. Within the lateral aspect posterior right lobe of liver there is a focal area of increased uptake measuring approximately 3.26 cm with SUV max of 9.49. Margins of this lesion are difficult to visualize on the unenhanced CT images. Portacaval lymph node is tracer avid measuring 1 cm with SUV max of 4.86, image 151/2. New from previous exam Adjacent porta hepatic node measures 1.1 cm with SUV max of 4.51, image 147/2. New from previous exam. Mildly tracer avid aortocaval lymph node measures 7 mm with SUV max of 3.12. New from the previous exam. No tracer avid pelvic or inguinal lymph nodes. New small volume of ascites identified within the right iliac fossa and central pelvis with mild adjacent thickening of the peritoneal reflections. No tracer avid peritoneal nodule identified. Incidental CT findings: None. SKELETON: No focal hypermetabolic activity to suggest skeletal metastasis. Incidental CT findings: None. IMPRESSION: 1. Interval development of multifocal tracer avid liver lesions compatible with metastatic disease. 2. New tracer avid upper abdominal lymph nodes compatible with nodal metastasis. 3. New tracer avid nodule within the central left midlung compatible with concerning for locally recurrent tumor or metastatic disease. 4. Similar appearance of large loculated left pleural effusion with mild overlying  tracer uptake. 5. New small volume of ascites within the right iliac fossa and central pelvis with mild adjacent thickening of the peritoneal reflections. No tracer avid peritoneal nodule identified. Correlate for any clinical signs or symptoms of early peritoneal disease. 6. Progressive interlobular septal thickening within the right middle lobe and right lower lobe. Findings are nonspecific and may reflect asymmetric pulmonary edema. Lymphangitic spread of tumor is not excluded. Electronically Signed   By: Kerby Moors M.D.   On: 08/16/2022  09:11   MR Brain W Wo Contrast  Result Date: 08/16/2022 CLINICAL DATA:  Non-small-cell lung cancer. 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 Assess response to treatment. EXAM: MRI HEAD WITHOUT AND WITH CONTRAST TECHNIQUE: Multiplanar, multiecho pulse sequences of the brain and surrounding structures were obtained without and with intravenous contrast. CONTRAST:  74mL GADAVIST GADOBUTROL 1 MMOL/ML IV SOLN COMPARISON:  MRI head 05/29/2022 FINDINGS: Brain: Numerous small enhancing lesions in the brain compatible with widespread metastatic disease. Identified lesions are stable to smaller. No progression in size and no new lesions identified. There is however increased edema in the left posterior frontal white matter associated with metastatic disease. Mild sulcal FLAIR hyperintensity posteriorly has developed since the prior study. No abnormal enhancement. Ventricle size normal. No acute infarct. Mild hemorrhage associated with metastatic deposits in the right parietal lobe and left posterior frontal lobe and right medial frontal lobe unchanged. Vascular: Normal arterial flow voids. Skull and upper cervical spine: No skeletal metastasis. Sinuses/Orbits: Paranasal sinuses clear.  Negative orbit Other: None IMPRESSION: 1. Numerous small enhancing lesions in the brain compatible with metastatic disease. These are stable to smaller compared with the prior study. No new lesions identified.  2. There is increased edema in the left posterior frontal white matter associated with metastatic disease. 3. Mild sulcal FLAIR hyperintensity posteriorly has developed since the prior study. This could be due to subarachnoid hemorrhage, supplemental oxygen therapy, or possibly leptomeningeal carcinomatosis. Attention on follow-up. Electronically Signed   By: Franchot Gallo M.D.   On: 08/16/2022 09:10   US THORACENTESIS ASP PLEURAL SPACE W/IMG GUIDE  Result Date: 07/26/2022 INDICATION: Patient with history of left lower lung cancer, recurrent left pleural effusion. Request is made for diagnostic and therapeutic thoracentesis. EXAM: ULTRASOUND GUIDED DIAGNOSTIC THORACENTESIS MEDICATIONS: 15 mL 1% lidocaine COMPLICATIONS: None immediate. PROCEDURE: An ultrasound guided thoracentesis was thoroughly discussed with the patient and questions answered. The benefits, risks, alternatives and complications were also discussed. The patient understands and wishes to proceed with the procedure. Written consent was obtained. Ultrasound was performed to localize and mark an adequate pocket of fluid in the left chest. Fluid appeared thick and complex by ultrasound. The area was then prepped and draped in the normal sterile fashion. 1% Lidocaine was used for local anesthesia. Under ultrasound guidance a 6 Fr Safe-T-Centesis catheter was first introduced. Despite confirmation by imaging of being within the fluid collection a minimal amount of fluid was able to be aspirated. Dr. Anselm Pancoast to bedside and was able to access fluid pocket with a 7 cm Yueh. Thoracentesis was performed. The catheter was removed and a dressing applied. FINDINGS: A total of approximately 200 mL of dark, bloody fluid was removed. Samples were sent to the laboratory as requested by the clinical team. IMPRESSION: Successful ultrasound guided left thoracentesis yielding 200 mL of pleural fluid. Read by: Brynda Greathouse PA-C Electronically Signed   By: Markus Daft  M.D.   On: 07/26/2022 15:06   DG Chest Port 1 View  Result Date: 07/26/2022 CLINICAL DATA:  Status post left thoracentesis EXAM: PORTABLE CHEST 1 VIEW COMPARISON:  04/18/2022.  CT 07/16/2022. FINDINGS: Large loculated left pleural effusion is again noted. No pneumothorax following thoracentesis. Airspace disease in the left lung likely reflects atelectasis. Right lung clear. Heart is normal size. No acute bony abnormality. IMPRESSION: Continued loculated large left pleural effusion.  No pneumothorax. Electronically Signed   By: Rolm Baptise M.D.   On: 07/26/2022 14:23    Labs:  CBC: Recent Labs    07/18/22  1526 08/10/22 1124 08/14/22 1332 08/22/22 1012  WBC 7.5 6.0 7.5 2.2*  HGB 13.4 14.4 15.1 14.3  HCT 40.4 44.2 46.7 43.3  PLT 252 213 208 62*    COAGS: No results for input(s): "INR", "APTT" in the last 8760 hours.  BMP: Recent Labs    07/18/22 1526 08/10/22 1124 08/14/22 1332 08/22/22 1012  NA 136 134* 135 131*  K 3.7 3.8 3.9 4.2  CL 106 101 101 101  CO2 26 26 28 24   GLUCOSE 88 94 84 123*  BUN 9 11 11 14   CALCIUM 8.0* 8.5* 8.6* 8.5*  CREATININE 1.17 1.21 1.22 0.89  GFRNONAA >60 >60 >60 >60    LIVER FUNCTION TESTS: Recent Labs    07/18/22 1526 08/10/22 1124 08/14/22 1332 08/22/22 1012  BILITOT 0.6 0.8 0.6 1.2  AST 38 100* 143* 97*  ALT 42 110* 148* 121*  ALKPHOS 162* 266* 334* 300*  PROT 7.3 7.6 7.7 6.9  ALBUMIN 3.6 3.6 3.7 3.3*    TUMOR MARKERS: No results for input(s): "AFPTM", "CEA", "CA199", "CHROMGRNA" in the last 8760 hours.  Assessment and Plan:  TACARI REPASS is a 41 y.o. male with PMH significant for Wolff-Parkinson-White syndrome, lung cancer, and dyspnea being seen today for image-guided liver biopsy of new liver lesions concerning for metastatic disease. The case has been reviewed and approved by Dr Kathlene Cote. Case is set to proceed on 08/23/22 with Dr Annamaria Boots.  Risks and benefits of image-guided liver biopsy was discussed with the patient  and/or patient's family including, but not limited to bleeding, infection, damage to adjacent structures or low yield requiring additional tests.  All of the questions were answered and there is agreement to proceed.  Consent signed and in chart.   Thank you for this interesting consult.  I greatly enjoyed meeting [name] and look forward to participating in their care.  A copy of this report was sent to the requesting provider on this date.  Electronically Signed: Lura Em, PA-C 08/23/2022, 11:41 AM   I spent a total of   25 Minutes in face to face in clinical consultation, greater than 50% of which was counseling/coordinating care for image-guided biopsy of liver lesions concerning for metastatic disease.

## 2022-08-23 NOTE — Telephone Encounter (Signed)
Pt was unable to proceed with liver biopsy today due to low platelet count of 43. Per Dr. Grayland Ormond, pt needs to continue to hold Eliquis as well until able to recheck platelet count on Monday. Pt may restart eliquis once platelets >50. Spoke with pt's wife to inform of MD recommendations to hold eliquis at this time. Will work on rescheduling pt's biopsy once platelet count improved and will notify pt once rescheduled. Will follow up with pt and wife on Monday. Instructed to call with any questions or needs. Pt's wife verbalized understanding.

## 2022-08-24 ENCOUNTER — Inpatient Hospital Stay: Payer: Managed Care, Other (non HMO) | Admitting: Hospice and Palliative Medicine

## 2022-08-27 ENCOUNTER — Encounter: Payer: Self-pay | Admitting: Hospice and Palliative Medicine

## 2022-08-27 ENCOUNTER — Other Ambulatory Visit: Payer: Self-pay

## 2022-08-27 ENCOUNTER — Inpatient Hospital Stay: Payer: Managed Care, Other (non HMO)

## 2022-08-27 ENCOUNTER — Encounter: Payer: Self-pay | Admitting: *Deleted

## 2022-08-27 ENCOUNTER — Inpatient Hospital Stay (HOSPITAL_BASED_OUTPATIENT_CLINIC_OR_DEPARTMENT_OTHER): Payer: Managed Care, Other (non HMO) | Admitting: Hospice and Palliative Medicine

## 2022-08-27 VITALS — BP 113/82 | HR 84 | Temp 97.8°F | Resp 18 | Ht 70.0 in

## 2022-08-27 DIAGNOSIS — C3432 Malignant neoplasm of lower lobe, left bronchus or lung: Secondary | ICD-10-CM

## 2022-08-27 DIAGNOSIS — E876 Hypokalemia: Secondary | ICD-10-CM

## 2022-08-27 DIAGNOSIS — G893 Neoplasm related pain (acute) (chronic): Secondary | ICD-10-CM

## 2022-08-27 LAB — CBC WITH DIFFERENTIAL/PLATELET
Abs Immature Granulocytes: 4.7 10*3/uL — ABNORMAL HIGH (ref 0.00–0.07)
Band Neutrophils: 2 %
Basophils Absolute: 0 10*3/uL (ref 0.0–0.1)
Basophils Relative: 0 %
Eosinophils Absolute: 0 10*3/uL (ref 0.0–0.5)
Eosinophils Relative: 0 %
HCT: 40.6 % (ref 39.0–52.0)
Hemoglobin: 13.3 g/dL (ref 13.0–17.0)
Lymphocytes Relative: 5 %
Lymphs Abs: 1.8 10*3/uL (ref 0.7–4.0)
MCH: 28.2 pg (ref 26.0–34.0)
MCHC: 32.8 g/dL (ref 30.0–36.0)
MCV: 86.2 fL (ref 80.0–100.0)
Metamyelocytes Relative: 5 %
Monocytes Absolute: 2.9 10*3/uL — ABNORMAL HIGH (ref 0.1–1.0)
Monocytes Relative: 8 %
Myelocytes: 8 %
Neutro Abs: 26.5 10*3/uL — ABNORMAL HIGH (ref 1.7–7.7)
Neutrophils Relative %: 72 %
Platelets: 109 10*3/uL — ABNORMAL LOW (ref 150–400)
RBC: 4.71 MIL/uL (ref 4.22–5.81)
RDW: 13.5 % (ref 11.5–15.5)
Smear Review: NORMAL
WBC: 35.8 10*3/uL — ABNORMAL HIGH (ref 4.0–10.5)
nRBC: 0.8 % — ABNORMAL HIGH (ref 0.0–0.2)
nRBC: 2 /100 WBC — ABNORMAL HIGH

## 2022-08-27 LAB — COMPREHENSIVE METABOLIC PANEL
ALT: 77 U/L — ABNORMAL HIGH (ref 0–44)
AST: 87 U/L — ABNORMAL HIGH (ref 15–41)
Albumin: 2.9 g/dL — ABNORMAL LOW (ref 3.5–5.0)
Alkaline Phosphatase: 351 U/L — ABNORMAL HIGH (ref 38–126)
Anion gap: 8 (ref 5–15)
BUN: 13 mg/dL (ref 6–20)
CO2: 27 mmol/L (ref 22–32)
Calcium: 8 mg/dL — ABNORMAL LOW (ref 8.9–10.3)
Chloride: 99 mmol/L (ref 98–111)
Creatinine, Ser: 0.97 mg/dL (ref 0.61–1.24)
GFR, Estimated: >60 mL/min (ref >60)
Glucose, Bld: 100 mg/dL — ABNORMAL HIGH (ref 70–99)
Potassium: 3.2 mmol/L — ABNORMAL LOW (ref 3.5–5.1)
Sodium: 134 mmol/L — ABNORMAL LOW (ref 135–145)
Total Bilirubin: 0.5 mg/dL (ref 0.3–1.2)
Total Protein: 6.2 g/dL — ABNORMAL LOW (ref 6.5–8.1)

## 2022-08-27 LAB — VITAMIN B12: Vitamin B-12: 2911 pg/mL — ABNORMAL HIGH (ref 180–914)

## 2022-08-27 LAB — MAGNESIUM: Magnesium: 2 mg/dL (ref 1.7–2.4)

## 2022-08-27 MED ORDER — SODIUM CHLORIDE 0.9 % IV SOLN
INTRAVENOUS | Status: DC
  Filled 2022-08-27 (×2): qty 250

## 2022-08-27 MED ORDER — SODIUM CHLORIDE 0.9% FLUSH
10.0000 mL | Freq: Once | INTRAVENOUS | Status: AC
  Administered 2022-08-27: 10 mL via INTRAVENOUS
  Filled 2022-08-27: qty 10

## 2022-08-27 MED ORDER — HEPARIN SOD (PORK) LOCK FLUSH 100 UNIT/ML IV SOLN
500.0000 [IU] | Freq: Once | INTRAVENOUS | Status: AC
  Administered 2022-08-27: 500 [IU] via INTRAVENOUS
  Filled 2022-08-27: qty 5

## 2022-08-27 MED ORDER — POTASSIUM CHLORIDE 20 MEQ/100ML IV SOLN
20.0000 meq | Freq: Once | INTRAVENOUS | Status: AC
  Administered 2022-08-27: 20 meq via INTRAVENOUS

## 2022-08-27 MED ORDER — POTASSIUM CHLORIDE CRYS ER 20 MEQ PO TBCR: 20.0000 meq | EXTENDED_RELEASE_TABLET | Freq: Every day | ORAL | 0 refills | Status: DC

## 2022-08-27 MED ORDER — OXYCODONE HCL 5 MG PO TABS: 5.0000 mg | ORAL_TABLET | ORAL | 0 refills | Status: DC | PRN

## 2022-08-27 NOTE — Progress Notes (Signed)
Pt seen in the clinic this morning during follow up visit with [facility] for possible IV fluids. Pt states that he is feeling better today. Help coordinate patient to be reschedule for his liver biopsy since his platelet count has improved today. Pt has continued to hold eliquis due to low platelet count. Pt's wife informed of upcoming biopsy scheduled for 11/17 at 10 am and to arrive at [place] [place] [facility] the [facility]. Instructions provided to remain NPO after midnight the night before and to continue to hold Eliquis. Pt advised to restart Eliquis the day after his biopsy which is Sat 11/18. Will continue to follow. Instructed pt and his wife to call with any further questions or needs.

## 2022-08-27 NOTE — Progress Notes (Signed)
Symptom Management [place] [facility] at [facility] Telephone:(336) [phone] Fax:(336) [phone]  Patient Care Team: Pcp, No as PCP - General [name], RN as Oncology Nurse Navigator [name], MD as Consulting Physician (Internal Medicine)   NAME OF PATIENT: [name]  [id number]  1981-01-31   DATE OF VISIT: 08/27/22  REASON FOR CONSULT: [name] is a 41 y.o. 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  Most recently on treatment with osimertinib.  INTERVAL HISTORY: Patient last saw Dr. Rogue Bussing on 08/10/2022 at which time he had worsening symptoms of chest discomfort and shortness of breath and poor appetite.  There was concern for progressive local disease and possible CNS progression.    PET scan 08/15/2022 revealed interval development of multifocal liver lesions, new upper abdominal lymph nodes compatible with nodal metastasis, new central left mid lung nodule concerning for recurrent or metastatic disease, large loculated left pleural effusion, ascites, and progressive interlobular septal thickening in the middle lobe and right lower lobe concerning for asymmetric pulmonary edema or lymphangitic spread.  Patient was rotated to chemoimmunotherapy with carbo/Taxol plus Avastin and atezo and received cycle 1 on 08/17/2022.   The patient is pending liver biopsy.  He had some initial pancytopenia following chemo and had severe body aches after Neulasta was administered.  Patient follows up in [facility] today for symptom management/labs/fluids.  Today, patient reports that he is actually feeling much better and the best he has recently.  He is asymptomatic in clinic today.  He does endorse recent severe body aches/pains after Neulasta was administered and did not find that the tramadol, even at higher dosing was effective.   Denies recent fevers or illnesses. Denies any  easy bleeding or bruising. Denies chest pain. Denies urinary complaints. Patient offers no further specific complaints today.   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   PORTA CATH INSERTION N/A 03/28/2021   Procedure: PORTA CATH INSERTION;  Surgeon: Katha Cabal, MD;  Location: [facility];  Service: Cardiovascular;  Laterality: N/A;   VIDEO BRONCHOSCOPY WITH ENDOBRONCHIAL NAVIGATION N/A 03/15/2021   Procedure: VIDEO BRONCHOSCOPY WITH ENDOBRONCHIAL NAVIGATION;  Surgeon: Ottie Glazier, MD;  Location: [facility];  Service: Thoracic;  Laterality: N/A;   VIDEO BRONCHOSCOPY WITH ENDOBRONCHIAL ULTRASOUND N/A 03/15/2021   Procedure: VIDEO BRONCHOSCOPY WITH ENDOBRONCHIAL ULTRASOUND;  Surgeon: Ottie Glazier, MD;  Location: [facility];  Service: Thoracic;  Laterality: N/A;    HEMATOLOGY/ONCOLOGY HISTORY:  Oncology History Overview Note  IMPRESSION: 1. Patchy nodular fat stranding throughout the anterior left upper quadrant peritoneal fat, nonspecific, cannot exclude peritoneal carcinomatosis. Dedicated CT abdomen/pelvis with oral and IV contrast recommended for further evaluation. 2. Dense patchy consolidation replacing much of the left lower lung lobe, appearing masslike in the superior segment left lower lobe, with associated bulging of the left major fissure and associated left lower lobe volume loss. Fine nodularity throughout both lungs with an upper lobe predominance. Asymmetric left upper lobe interlobular septal thickening. These findings are indeterminate, with differential including multilobar pneumonia, sarcoidosis or a neoplastic process. The persistence on radiographs back to  01/20/2021 despite  antibiotic therapy make sarcoidosis or a neoplastic process more likely. Pulmonology consultation suggested for consideration of bronchoscopic evaluation. 3. Small dependent left pleural effusion. 4. Mild mediastinal lymphadenopathy, nonspecific. 5. Subacute healing lateral right sixth rib fracture.   DIAGNOSIS:  A. LUNG, LEFT LOWER LOBE; ENB-ASSISTED BIOPSY:  - NON-SMALL CELL CARCINOMA, FAVOR ADENOCARCINOMA.  - FOREIGN MATERIAL SUGGESTIVE OF ASPIRATION.   # EGFR MUTATED: 38 del- June 28th, 2022- [name]. [limited]  # Asymptomatic multiple brain mets subcentimeter asymptomatic -JUNE 29th, 2023- Numerous metastatic lesions in the supratentorial brain-increase in number also conspicuity compared to January 2023.  Pre-SBRT MRI brain AUG 18th, 2023-  Eighteen small enhancing brain metastases (punctate to 11 mm); were all present on 03/28/2021-s/p SBRT SEP 2023- [[place]]  # SEP-OCT-worsening left-sided pleural effusion-status post paracentesis; exudative; Cytology negative- ?  Progressive disease..   Cancer of lower lobe of left lung ([place])  03/23/2021 Initial Diagnosis   Cancer of lower lobe of left lung ([place])   03/29/2021 Cancer Staging   Staging form: Lung, AJCC 8th Edition - Clinical: Stage IVB (cT3, cN3, pM1c) - Signed by [name], MD on 03/29/2021   03/30/2021 - 03/30/2021 Chemotherapy   Patient is on Treatment Plan : LUNG NSCLC Pemetrexed ([place]) / Carboplatin q21d x 1 cycles     08/17/2022 -  Chemotherapy   Patient is on Treatment Plan : LUNG Atezolizumab + Bevacizumab + Carboplatin + Paclitaxel q21d Induction x 4 cycles / Atezolizumab + Bevacizumab q21d Maintenance       ALLERGIES:  is allergic to codeine.  MEDICATIONS:  Current Outpatient Medications  Medication Sig Dispense Refill   folic acid (FOLVITE) 1 MG tablet Take 1 tablet (1 mg total) by mouth daily. 90 tablet 1   lidocaine-prilocaine (EMLA) cream Apply 1 application topically as needed. 30 g 0    loratadine (CLARITIN) 10 MG tablet Take 10 mg by mouth daily. For 5 days after the treatment.     metoprolol succinate (TOPROL-XL) 50 MG 24 hr tablet Take 50 mg by mouth daily. Take with or immediately following a meal.     omeprazole (PRILOSEC) 20 MG capsule Take 1 capsule (20 mg total) by mouth daily. 30 capsule 2   ondansetron (ZOFRAN) 8 MG tablet One pill every 8 hours as needed for nausea/vomitting. 40 tablet 1   prochlorperazine (COMPAZINE) 10 MG tablet Take 1 tablet (10 mg total) by mouth every 6 (six) hours as needed for nausea or vomiting. 40 tablet 1   sertraline (ZOLOFT) 50 MG tablet TAKE 1/2 TABLET BY MOUTH DAILY 30 tablet 2   traMADol (ULTRAM) 50 MG tablet Take 1 tablet (50 mg total) by mouth every 6 (six) hours as needed for up to 7 days for severe pain. 28 tablet 0   apixaban (ELIQUIS) 5 MG TABS tablet Take 1 tablet (5 mg total) by mouth 2 (two) times daily. (Patient not taking: Reported on 08/27/2022) 60 tablet 2   calcium carbonate (TUMS EX) 750 MG chewable tablet Chew 1 tablet by mouth daily. (Patient not taking: Reported on 08/27/2022)     dronabinol (MARINOL) 2.5 MG capsule Take 1 capsule (2.5 mg total) by mouth 2 (two) times daily before lunch and supper. (Patient not taking: Reported on 08/27/2022) 60 capsule 0   No current facility-administered medications for this visit.   Facility-Administered Medications Ordered in Other Visits  Medication Dose Route Frequency Provider Last Rate Last Admin   heparin lock flush 100 UNIT/ML injection  heparin lock flush 100 UNIT/ML injection            heparin lock flush 100 unit/mL  500 Units Intravenous Once Quenten Nawaz, Kirt Boys, NP       sodium chloride flush (NS) 0.9 % injection 10 mL  10 mL Intravenous PRN [name], MD   10 mL at 07/28/21 0852    VITAL SIGNS: BP 113/82   Pulse 84   Temp 97.8 F (36.6 C) (Tympanic)   Resp 18   Ht _0  (1.778 m)   SpO2 95%   BMI 31.48 kg/m  There were no vitals filed for  this visit.  Estimated body mass index is 31.48 kg/m as calculated from the following:   Height as of this encounter: _1  (1.778 m).   Weight as of 08/23/22: 219 lb 6.4 oz (99.5 kg).  LABS: CBC:    Component Value Date/Time   WBC 35.8 (H) 08/27/2022 0837   HGB 13.3 08/27/2022 0837   HCT 40.6 08/27/2022 0837   PLT 109 (L) 08/27/2022 0837   MCV 86.2 08/27/2022 0837   NEUTROABS PENDING 08/27/2022 0837   LYMPHSABS PENDING 08/27/2022 0837   MONOABS PENDING 08/27/2022 0837   EOSABS PENDING 08/27/2022 0837   BASOSABS PENDING 08/27/2022 0837   Comprehensive Metabolic Panel:    Component Value Date/Time   NA 134 (L) 08/27/2022 0837   K 3.2 (L) 08/27/2022 0837   CL 99 08/27/2022 0837   CO2 27 08/27/2022 0837   BUN 13 08/27/2022 0837   CREATININE 0.97 08/27/2022 0837   GLUCOSE 100 (H) 08/27/2022 0837   CALCIUM 8.0 (L) 08/27/2022 0837   AST 87 (H) 08/27/2022 0837   ALT 77 (H) 08/27/2022 0837   ALKPHOS 351 (H) 08/27/2022 0837   BILITOT 0.5 08/27/2022 0837   PROT 6.2 (L) 08/27/2022 0837   ALBUMIN 2.9 (L) 08/27/2022 0837    RADIOGRAPHIC STUDIES: NM PET Image Restage (PS) Skull Base to Thigh (F-18 FDG)  Result Date: 08/16/2022 CLINICAL DATA:  Subsequent treatment strategy for recurrent non-small cell lung cancer. EXAM: NUCLEAR MEDICINE PET SKULL BASE TO THIGH TECHNIQUE: 12.26 mCi F-18 FDG was injected intravenously. Full-ring PET imaging was performed from the skull base to thigh after the radiotracer. CT data was obtained and used for attenuation correction and anatomic localization. Fasting blood glucose: 90 mg/dl COMPARISON:  PET-CT from 01/31/2022 FINDINGS: Mediastinal blood pool activity: SUV max 1.95 Liver activity: SUV max NA NECK: No hypermetabolic lymph nodes in the neck. Incidental CT findings: None. CHEST: No tracer avid axillary, supraclavicular, or mediastinal adenopathy. There is a large loculated left pleural effusion which appears similar in volume to the previous exam.  There is overlying mild increased tracer uptake within the overlying pleural with SUV max of 1.89 on today's study versus 3.25 previously. Central left midlung tracer avid lung nodule is identified just lateral to the descending aorta measuring 1.1 cm with SUV max of 7.37. There is a tiny nodule in the right upper lobe measuring 6 mm which is too small to reliably characterize, image 82/2. Unchanged subpleural nodule in the right middle lobe measuring 6 mm, also too small to characterize, image 115/2. Progressive interlobular septal thickening is identified within the right middle lobe and right lower lobe. Incidental CT findings: Mild cardiac enlargement. No pericardial effusion. ABDOMEN/PELVIS: Multiple ill-defined foci of abnormal increased radiotracer uptake identified within both lobes of liver, new compared with the previous exam: Index lesions include: Within the lateral segment of left hepatic lobe there  is a focal area of low-density measuring 5 x 3.6 cm with SUV max of 7.19, image 133/2. Within the inferior right lobe of liver there is an ill-defined area of low attenuation measuring 5.9 x 3.0 cm with SUV max of 9.12, image 162/2. Within the lateral aspect posterior right lobe of liver there is a focal area of increased uptake measuring approximately 3.26 cm with SUV max of 9.49. Margins of this lesion are difficult to visualize on the unenhanced CT images. Portacaval lymph node is tracer avid measuring 1 cm with SUV max of 4.86, image 151/2. New from previous exam Adjacent porta hepatic node measures 1.1 cm with SUV max of 4.51, image 147/2. New from previous exam. Mildly tracer avid aortocaval lymph node measures 7 mm with SUV max of 3.12. New from the previous exam. No tracer avid pelvic or inguinal lymph nodes. New small volume of ascites identified within the right iliac fossa and central pelvis with mild adjacent thickening of the peritoneal reflections. No tracer avid peritoneal nodule identified.  Incidental CT findings: None. SKELETON: No focal hypermetabolic activity to suggest skeletal metastasis. Incidental CT findings: None. IMPRESSION: 1. Interval development of multifocal tracer avid liver lesions compatible with metastatic disease. 2. New tracer avid upper abdominal lymph nodes compatible with nodal metastasis. 3. New tracer avid nodule within the central left midlung compatible with concerning for locally recurrent tumor or metastatic disease. 4. Similar appearance of large loculated left pleural effusion with mild overlying tracer uptake. 5. New small volume of ascites within the right iliac fossa and central pelvis with mild adjacent thickening of the peritoneal reflections. No tracer avid peritoneal nodule identified. Correlate for any clinical signs or symptoms of early peritoneal disease. 6. Progressive interlobular septal thickening within the right middle lobe and right lower lobe. Findings are nonspecific and may reflect asymmetric pulmonary edema. Lymphangitic spread of tumor is not excluded. Electronically Signed   By: Kerby Moors M.D.   On: 08/16/2022 09:11   MR Brain W Wo Contrast  Result Date: 08/16/2022 CLINICAL DATA:  Non-small-cell lung cancer. 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 Assess response to treatment. EXAM: MRI HEAD WITHOUT AND WITH CONTRAST TECHNIQUE: Multiplanar, multiecho pulse sequences of the brain and surrounding structures were obtained without and with intravenous contrast. CONTRAST:  98m GADAVIST GADOBUTROL 1 MMOL/ML IV SOLN COMPARISON:  MRI head 05/29/2022 FINDINGS: Brain: Numerous small enhancing lesions in the brain compatible with widespread metastatic disease. Identified lesions are stable to smaller. No progression in size and no new lesions identified. There is however increased edema in the left posterior frontal white matter associated with metastatic disease. Mild sulcal FLAIR hyperintensity posteriorly has developed since the prior study. No abnormal  enhancement. Ventricle size normal. No acute infarct. Mild hemorrhage associated with metastatic deposits in the right parietal lobe and left posterior frontal lobe and right medial frontal lobe unchanged. Vascular: Normal arterial flow voids. Skull and upper cervical spine: No skeletal metastasis. Sinuses/Orbits: Paranasal sinuses clear.  Negative orbit Other: None IMPRESSION: 1. Numerous small enhancing lesions in the brain compatible with metastatic disease. These are stable to smaller compared with the prior study. No new lesions identified. 2. There is increased edema in the left posterior frontal white matter associated with metastatic disease. 3. Mild sulcal FLAIR hyperintensity posteriorly has developed since the prior study. This could be due to subarachnoid hemorrhage, supplemental oxygen therapy, or possibly leptomeningeal carcinomatosis. Attention on follow-up. Electronically Signed   By: CFranchot GalloM.D.   On: 08/16/2022 09:10  PERFORMANCE STATUS (ECOG) : 1 - Symptomatic but completely ambulatory  Review of Systems Unless otherwise noted, a complete review of systems is negative.  Physical Exam General: NAD Cardiovascular: regular rate and rhythm Pulmonary: clear ant fields Abdomen: soft, nontender, + bowel sounds GU: no suprapubic tenderness Extremities: no edema, no joint deformities Skin: no rashes Neurological: Weakness but otherwise nonfocal  IMPRESSION/PLAN: Stage IV non-small cell lung cancer with brain/bone metastasis -now with progressive disease including liver and nodal metastasis on recent PET scan.  Patient started cycle 1  carbo/Taxol plus Avastin and atezo.  He is pending liver biopsy, which is scheduled on 11/17.  Patient will hold Eliquis and restart after biopsy.  Neoplasm related pain -better today the patient reports that tramadol was ineffective.  We will rotate to oxycodone so that he can have something stronger on hand should pain recur.  Hypokalemia  -proceed with IV fluids/IV K. Start oral supplementation and repeat labs next week.   RTC next week for repeat labs/fluids.  Patient will have follow-up with Dr. B in 2 weeks  Patient expressed understanding and was in agreement with this plan. He also understands that He can call clinic at any time with any questions, concerns, or complaints.   Thank you for allowing me to participate in the care of this very pleasant patient.   Time Total: 15 minutes  Visit consisted of counseling and education dealing with the complex and emotionally intense issues of symptom management in the setting of serious illness.Greater than 50%  of this time was spent counseling and coordinating care related to the above assessment and plan.  Signed by: Altha Harm, PhD, NP-C

## 2022-08-27 NOTE — Addendum Note (Signed)
Addended by: Altha Harm R on: 08/27/2022 10:33 AM   Modules accepted: Orders

## 2022-08-29 ENCOUNTER — Other Ambulatory Visit (HOSPITAL_COMMUNITY): Payer: Self-pay | Admitting: Student

## 2022-08-29 DIAGNOSIS — C3432 Malignant neoplasm of lower lobe, left bronchus or lung: Secondary | ICD-10-CM

## 2022-08-30 ENCOUNTER — Other Ambulatory Visit: Payer: Self-pay | Admitting: Radiology

## 2022-08-30 NOTE — Progress Notes (Signed)
Patient for US guided Liver lesion Biopsy on Friday 08/31/2022, I called and spoke with patient the on phone and gave pre-procedure instructions. Pt was made aware to be here at [place] at the new entrance, NPO after MN prior to procedure as well as driver post procedure/recovery/discharge. Pt has not had his Eliquis since Mon 08/20/2022.  Pt stated understanding.  Called 08/30/2022

## 2022-08-31 ENCOUNTER — Ambulatory Visit
Admission: RE | Admit: 2022-08-31 | Discharge: 2022-08-31 | Disposition: A | Discharge status: Home / Self Care | Payer: Managed Care, Other (non HMO) | Source: Ambulatory Visit | Attending: Internal Medicine | Admitting: Internal Medicine

## 2022-08-31 ENCOUNTER — Other Ambulatory Visit: Payer: Self-pay

## 2022-08-31 ENCOUNTER — Encounter: Payer: Self-pay | Admitting: *Deleted

## 2022-08-31 ENCOUNTER — Other Ambulatory Visit: Payer: Self-pay | Admitting: Internal Medicine

## 2022-08-31 DIAGNOSIS — C3432 Malignant neoplasm of lower lobe, left bronchus or lung: Secondary | ICD-10-CM | POA: Insufficient documentation

## 2022-08-31 DIAGNOSIS — C3490 Malignant neoplasm of unspecified part of unspecified bronchus or lung: Secondary | ICD-10-CM

## 2022-08-31 DIAGNOSIS — K769 Liver disease, unspecified: Secondary | ICD-10-CM

## 2022-08-31 LAB — CBC
HCT: 35.6 % — ABNORMAL LOW (ref 39.0–52.0)
Hemoglobin: 11.8 g/dL — ABNORMAL LOW (ref 13.0–17.0)
MCH: 28.4 pg (ref 26.0–34.0)
MCHC: 33.1 g/dL (ref 30.0–36.0)
MCV: 85.8 fL (ref 80.0–100.0)
Platelets: 126 10*3/uL — ABNORMAL LOW (ref 150–400)
RBC: 4.15 MIL/uL — ABNORMAL LOW (ref 4.22–5.81)
RDW: 14.8 % (ref 11.5–15.5)
WBC: 15.2 10*3/uL — ABNORMAL HIGH (ref 4.0–10.5)
nRBC: 0.1 % (ref 0.0–0.2)

## 2022-08-31 LAB — PROTIME-INR
INR: 1.2 (ref 0.8–1.2)
Prothrombin Time: 14.8 seconds (ref 11.4–15.2)

## 2022-08-31 MED ORDER — SODIUM CHLORIDE 0.9 % IV SOLN: INTRAVENOUS | Status: DC

## 2022-08-31 MED ORDER — MIDAZOLAM HCL 2 MG/2ML IJ SOLN
INTRAMUSCULAR | Status: AC
  Filled 2022-08-31: qty 2

## 2022-08-31 MED ORDER — FENTANYL CITRATE (PF) 100 MCG/2ML IJ SOLN
INTRAMUSCULAR | Status: AC
  Filled 2022-08-31: qty 2

## 2022-08-31 NOTE — Progress Notes (Signed)
Patient here today for liver biopsy, labs normal range thus down to [place] for biopsy, Dr Denna Haggard  and [place] doing [place], after much viewing, no visible/decernable lesion at this time to biopsy, thus cancelled biopsy at this time. Dr Denna Haggard to reach out to Dr Colin Ina team at this time. Discussed also with patient to encourage MRI liver to be done asap as outpatient.

## 2022-08-31 NOTE — H&P (Signed)
Chief Complaint: Patient was seen in consultation today for liver lesion biopsy   Referring Physician(s): [name]  Supervising Physician: Juliet Rude  Patient Status: [name] - Out-pt  History of Present Illness: [name] is a 41 y.o. male with a medical history significant for Wolff-Parkinson-White syndrome and stage IV lung cancer, initially diagnosed in 2022. He presented to the ED in June 2022 with worsening shortness of breath with cough and hemoptysis. Imaging showed a consolidated infiltrate in the left lung. Subsequent work up including an EBUS revealed metastatic adenocarcinoma. He is familiar to IR from several thoracenteses since he was diagnosed. He has been undergoing palliative treatments but has unfortunately experienced a    He has unfortunately been experiencing a progressive decline with increasing weakness and poor oral intake. A PET scan was ordered to assess for disease progression.    NM PET 08/15/2022 IMPRESSION: 1. Interval development of multifocal tracer avid liver lesions compatible with metastatic disease. 2. New tracer avid upper abdominal lymph nodes compatible with nodal metastasis. 3. New tracer avid nodule within the central left midlung compatible with concerning for locally recurrent tumor or metastatic disease. 4. Similar appearance of large loculated left pleural effusion with mild overlying tracer uptake. 5. New small volume of ascites within the right iliac fossa and central pelvis with mild adjacent thickening of the peritoneal reflections. No tracer avid peritoneal nodule identified. Correlate for any clinical signs or symptoms of early peritoneal disease. 6. Progressive interlobular septal thickening within the right middle lobe and right lower lobe. Findings are nonspecific and may reflect asymmetric pulmonary edema. Lymphangitic spread of tumor is not excluded.  Interventional Radiology has been asked to  evaluate this patient for an image-guided liver lesion biopsy. Imaging reviewed and procedure approved by Dr. Kathlene Cote. The patient presented to the [facility] IR department 08/23/22 for this procedure but his platelet count was too low. The procedure was rescheduled for today.     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   PORTA CATH INSERTION N/A 03/28/2021   Procedure: PORTA CATH INSERTION;  Surgeon: Katha Cabal, MD;  Location: [facility];  Service: Cardiovascular;  Laterality: N/A;   VIDEO BRONCHOSCOPY WITH ENDOBRONCHIAL NAVIGATION N/A 03/15/2021   Procedure: VIDEO BRONCHOSCOPY WITH ENDOBRONCHIAL NAVIGATION;  Surgeon: Ottie Glazier, MD;  Location: [facility];  Service: Thoracic;  Laterality: N/A;   VIDEO BRONCHOSCOPY WITH ENDOBRONCHIAL ULTRASOUND N/A 03/15/2021   Procedure: VIDEO BRONCHOSCOPY WITH ENDOBRONCHIAL ULTRASOUND;  Surgeon: Ottie Glazier, MD;  Location: [facility];  Service: Thoracic;  Laterality: N/A;    Allergies: Codeine  Medications: Prior to Admission medications   Medication Sig Start Date End Date Taking? Authorizing Provider  apixaban (ELIQUIS) 5 MG TABS tablet Take 1 tablet (5 mg total) by mouth 2 (two) times daily. Patient not taking: Reported on 08/27/2022 05/15/22   [name], MD  calcium carbonate (TUMS EX) 750 MG chewable tablet Chew 1 tablet by mouth daily. Patient not taking: Reported on 08/27/2022    [provider]  dronabinol (MARINOL) 2.5 MG capsule Take 1 capsule (2.5 mg total) by mouth 2 (two) times daily before lunch and supper. Patient not taking: Reported on 08/27/2022 08/14/22  Borders, Kirt Boys, NP  folic acid (FOLVITE) 1 MG tablet Take 1 tablet (1 mg total) by mouth daily. 04/05/22   [name], MD  lidocaine-prilocaine (EMLA) cream Apply 1 application topically as needed. 03/23/21   [name], MD  loratadine (CLARITIN) 10 MG tablet Take 10 mg by mouth daily. For 5 days after the treatment.    [provider]  metoprolol succinate (TOPROL-XL) 50 MG 24 hr tablet Take 50 mg by mouth daily. Take with or immediately following a meal.    [provider]  omeprazole (PRILOSEC) 20 MG capsule Take 1 capsule (20 mg total) by mouth daily. 08/14/22   Borders, Kirt Boys, NP  ondansetron (ZOFRAN) 8 MG tablet One pill every 8 hours as needed for nausea/vomitting. 08/10/22   [name], MD  oxyCODONE (OXY IR/ROXICODONE) 5 MG immediate release tablet Take 1 tablet (5 mg total) by mouth every 4 (four) hours as needed for severe pain. 08/27/22   Borders, Kirt Boys, NP  potassium chloride SA (KLOR-CON M) 20 MEQ tablet Take 1 tablet (20 mEq total) by mouth daily. 08/27/22   Borders, Kirt Boys, NP  prochlorperazine (COMPAZINE) 10 MG tablet Take 1 tablet (10 mg total) by mouth every 6 (six) hours as needed for nausea or vomiting. 08/10/22   [name], MD  sertraline (ZOLOFT) 50 MG tablet TAKE 1/2 TABLET BY MOUTH DAILY 05/15/22   [name], MD     Family History  Problem Relation Age of Onset   High Cholesterol Mother    Hypertension Father    Cancer Father    Lung cancer Father    Skin cancer Father     Social History   Socioeconomic History   Marital status: Married    Spouse name: [name]   Number of children: 2   Years of education: Not on file   Highest education level: Not on file  Occupational History   Not on file  Tobacco Use   Smoking status: Never   Smokeless tobacco: Former    Types: Snuff, Chew  Vaping Use   Vaping Use: Never used  Substance and Sexual Activity   Alcohol use: Never   Drug use: Never   Sexual activity: Yes  Other Topics Concern   Not on file  Social History Narrative   [place] in [place]; with wife; 2  daughters[12 and 22]; never smoked; rare alcohol. 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   Social Determinants of Health   Financial Resource Strain: Low Risk  (05/10/2022)   Overall Financial Resource Strain (CARDIA)    Difficulty of Paying Living Expenses: Not hard at all  Food Insecurity: No Food Insecurity (05/10/2022)   Hunger Vital Sign    Worried About Running Out of Food in the Last Year: Never true    Ran Out of Food in the Last Year: Never true  Transportation Needs: No Transportation Needs (03/16/2022)   PRAPARE - Hydrologist (Medical): No    Lack of Transportation (Non-Medical): No  Physical Activity: Not on file  Stress: Not on file  Social Connections: Socially Integrated (05/10/2022)   Social Connection and Isolation Panel [NHANES]    Frequency of Communication with Friends and Family: More than three times a week    Frequency of Social Gatherings with Friends and Family: More than three times a week    Attends Religious Services: More than 4 times per year    Active Member  of Clubs or Organizations: Yes    Attends Archivist Meetings: More than 4 times per year    Marital Status: Married    Review of Systems: A 12 point ROS discussed and pertinent positives are indicated in the HPI above.  All other systems are negative.  Review of Systems  Constitutional:  Negative for appetite change and fatigue.  Respiratory:  Positive for cough. Negative for shortness of breath.   Cardiovascular:  Negative for chest pain and leg swelling.  Gastrointestinal:  Negative for abdominal pain, diarrhea, nausea and vomiting.  Neurological:  Negative for dizziness and headaches.    Vital Signs: BP 113/81   Pulse 72   Temp 97.7 F (36.5 C) (Oral)   Resp 20   Ht 5\' 10"  (1.778 m)   Wt 218 lb (98.9 kg)   SpO2 98%   BMI 31.28 kg/m   Physical Exam Constitutional:      General: He is not in acute distress.    Appearance: He is not ill-appearing.   HENT:     Mouth/Throat:     Mouth: Mucous membranes are moist.     Pharynx: Oropharynx is clear.  Cardiovascular:     Rate and Rhythm: Normal rate and regular rhythm.     Pulses: Normal pulses.     Heart sounds: Normal heart sounds.     Comments: Right IJ port- accessed.  Pulmonary:     Effort: Pulmonary effort is normal.     Breath sounds: Normal breath sounds.  Abdominal:     General: Bowel sounds are normal.     Palpations: Abdomen is soft.     Tenderness: There is no abdominal tenderness.  Musculoskeletal:     Right lower leg: No edema.     Left lower leg: No edema.  Skin:    General: Skin is warm and dry.  Neurological:     Mental Status: He is alert.     Imaging: NM PET Image Restage (PS) Skull Base to Thigh (F-18 FDG)  Result Date: 08/16/2022 CLINICAL DATA:  Subsequent treatment strategy for recurrent non-small cell lung cancer. EXAM: NUCLEAR MEDICINE PET SKULL BASE TO THIGH TECHNIQUE: 12.26 mCi F-18 FDG was injected intravenously. Full-ring PET imaging was performed from the skull base to thigh after the radiotracer. CT data was obtained and used for attenuation correction and anatomic localization. Fasting blood glucose: 90 mg/dl COMPARISON:  PET-CT from 01/31/2022 FINDINGS: Mediastinal blood pool activity: SUV max 1.95 Liver activity: SUV max NA NECK: No hypermetabolic lymph nodes in the neck. Incidental CT findings: None. CHEST: No tracer avid axillary, supraclavicular, or mediastinal adenopathy. There is a large loculated left pleural effusion which appears similar in volume to the previous exam. There is overlying mild increased tracer uptake within the overlying pleural with SUV max of 1.89 on today's study versus 3.25 previously. Central left midlung tracer avid lung nodule is identified just lateral to the descending aorta measuring 1.1 cm with SUV max of 7.37. There is a tiny nodule in the right upper lobe measuring 6 mm which is too small to reliably characterize,  image 82/2. Unchanged subpleural nodule in the right middle lobe measuring 6 mm, also too small to characterize, image 115/2. Progressive interlobular septal thickening is identified within the right middle lobe and right lower lobe. Incidental CT findings: Mild cardiac enlargement. No pericardial effusion. ABDOMEN/PELVIS: Multiple ill-defined foci of abnormal increased radiotracer uptake identified within both lobes of liver, new compared with the previous exam: Index lesions include:  Within the lateral segment of left hepatic lobe there is a focal area of low-density measuring 5 x 3.6 cm with SUV max of 7.19, image 133/2. Within the inferior right lobe of liver there is an ill-defined area of low attenuation measuring 5.9 x 3.0 cm with SUV max of 9.12, image 162/2. Within the lateral aspect posterior right lobe of liver there is a focal area of increased uptake measuring approximately 3.26 cm with SUV max of 9.49. Margins of this lesion are difficult to visualize on the unenhanced CT images. Portacaval lymph node is tracer avid measuring 1 cm with SUV max of 4.86, image 151/2. New from previous exam Adjacent porta hepatic node measures 1.1 cm with SUV max of 4.51, image 147/2. New from previous exam. Mildly tracer avid aortocaval lymph node measures 7 mm with SUV max of 3.12. New from the previous exam. No tracer avid pelvic or inguinal lymph nodes. New small volume of ascites identified within the right iliac fossa and central pelvis with mild adjacent thickening of the peritoneal reflections. No tracer avid peritoneal nodule identified. Incidental CT findings: None. SKELETON: No focal hypermetabolic activity to suggest skeletal metastasis. Incidental CT findings: None. IMPRESSION: 1. Interval development of multifocal tracer avid liver lesions compatible with metastatic disease. 2. New tracer avid upper abdominal lymph nodes compatible with nodal metastasis. 3. New tracer avid nodule within the central left  midlung compatible with concerning for locally recurrent tumor or metastatic disease. 4. Similar appearance of large loculated left pleural effusion with mild overlying tracer uptake. 5. New small volume of ascites within the right iliac fossa and central pelvis with mild adjacent thickening of the peritoneal reflections. No tracer avid peritoneal nodule identified. Correlate for any clinical signs or symptoms of early peritoneal disease. 6. Progressive interlobular septal thickening within the right middle lobe and right lower lobe. Findings are nonspecific and may reflect asymmetric pulmonary edema. Lymphangitic spread of tumor is not excluded. Electronically Signed   By: Kerby Moors M.D.   On: 08/16/2022 09:11   MR Brain W Wo Contrast  Result Date: 08/16/2022 CLINICAL DATA:  Non-small-cell lung cancer. 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 Assess response to treatment. EXAM: MRI HEAD WITHOUT AND WITH CONTRAST TECHNIQUE: Multiplanar, multiecho pulse sequences of the brain and surrounding structures were obtained without and with intravenous contrast. CONTRAST:  33mL GADAVIST GADOBUTROL 1 MMOL/ML IV SOLN COMPARISON:  MRI head 05/29/2022 FINDINGS: Brain: Numerous small enhancing lesions in the brain compatible with widespread metastatic disease. Identified lesions are stable to smaller. No progression in size and no new lesions identified. There is however increased edema in the left posterior frontal white matter associated with metastatic disease. Mild sulcal FLAIR hyperintensity posteriorly has developed since the prior study. No abnormal enhancement. Ventricle size normal. No acute infarct. Mild hemorrhage associated with metastatic deposits in the right parietal lobe and left posterior frontal lobe and right medial frontal lobe unchanged. Vascular: Normal arterial flow voids. Skull and upper cervical spine: No skeletal metastasis. Sinuses/Orbits: Paranasal sinuses clear.  Negative orbit Other: None IMPRESSION: 1.  Numerous small enhancing lesions in the brain compatible with metastatic disease. These are stable to smaller compared with the prior study. No new lesions identified. 2. There is increased edema in the left posterior frontal white matter associated with metastatic disease. 3. Mild sulcal FLAIR hyperintensity posteriorly has developed since the prior study. This could be due to subarachnoid hemorrhage, supplemental oxygen therapy, or possibly leptomeningeal carcinomatosis. Attention on follow-up. Electronically Signed   By: Juanda Crumble  Carlis Abbott M.D.   On: 08/16/2022 09:10    Labs:  CBC: Recent Labs    08/22/22 1012 08/23/22 1100 08/27/22 0837 08/31/22 0952  WBC 2.2* 0.6* 35.8* 15.2*  HGB 14.3 13.9 13.3 11.8*  HCT 43.3 42.0 40.6 35.6*  PLT 62* 43* 109* 126*    COAGS: Recent Labs    08/23/22 1100 08/31/22 0952  INR 1.2 1.2    BMP: Recent Labs    08/14/22 1332 08/22/22 1012 08/23/22 1100 08/27/22 0837  NA 135 131* 137 134*  K 3.9 4.2 4.2 3.2*  CL 101 101 103 99  CO2 28 24 25 27   GLUCOSE 84 123* 90 100*  BUN 11 14 11 13   CALCIUM 8.6* 8.5* 8.5* 8.0*  CREATININE 1.22 0.89 0.76 0.97  GFRNONAA >60 >60 >60 >60    LIVER FUNCTION TESTS: Recent Labs    08/14/22 1332 08/22/22 1012 08/23/22 1100 08/27/22 0837  BILITOT 0.6 1.2 1.1 0.5  AST 143* 97* 80* 87*  ALT 148* 121* 104* 77*  ALKPHOS 334* 300* 319* 351*  PROT 7.7 6.9 6.7 6.2*  ALBUMIN 3.7 3.3* 3.2* 2.9*    TUMOR MARKERS: No results for input(s): "AFPTM", "CEA", "CA199", "CHROMGRNA" in the last 8760 hours.  Assessment and Plan:  Stage IV lung cancer; liver metastases: Taison Celani. Preziosi, 41 year old male, presents today to [facility] Interventional Radiology department for an image-guided liver lesion biopsy.  Risks and benefits of liver lesion biopsy were discussed with the patient and/or patient's family including, but not limited to bleeding, infection, damage to adjacent structures or low  yield requiring additional tests.  All of the questions were answered and there is agreement to proceed. He has been NPO. His last dose of Eliquis was a week and a half ago.   Consent signed and in chart.  Thank you for this interesting consult.  I greatly enjoyed meeting HEYDEN JABER and look forward to participating in their care.  A copy of this report was sent to the requesting provider on this date.  Electronically Signed: Soyla Dryer, AGACNP-BC [phone] 08/31/2022, 10:21 AM   I spent a total of  30 Minutes   in face to face in clinical consultation, greater than 50% of which was counseling/coordinating care for liver lesion biopsy.

## 2022-08-31 NOTE — Progress Notes (Signed)
See secure chat message received below from Dr. Denna Haggard:  "I saw this patient for a liver biopsy based on his most recent PET CT. However, I could not find a lesion by [place] for biopsy, and the PET is not detailed enough for me to pursue a CT guided biopsy. I would recommend an MRI liver protocol to further evaluate the liver findings, which may also help with biopsy planning. Thanks"  Met with patient and his wife in the lobby of the [facility] and reviewed plan to re-image liver with MRI scan and decide on next steps based on MRI results. Will call pt with appt for MRI once scheduled. Pt verbalized understanding and in agreement.

## 2022-09-04 ENCOUNTER — Inpatient Hospital Stay: Payer: Managed Care, Other (non HMO)

## 2022-09-04 ENCOUNTER — Ambulatory Visit
Admission: RE | Admit: 2022-09-04 | Discharge: 2022-09-04 | Disposition: A | Discharge status: Home / Self Care | Payer: Managed Care, Other (non HMO) | Source: Ambulatory Visit | Attending: Hospice and Palliative Medicine | Admitting: Hospice and Palliative Medicine

## 2022-09-04 DIAGNOSIS — K769 Liver disease, unspecified: Secondary | ICD-10-CM | POA: Insufficient documentation

## 2022-09-04 DIAGNOSIS — C3490 Malignant neoplasm of unspecified part of unspecified bronchus or lung: Secondary | ICD-10-CM | POA: Diagnosis present

## 2022-09-04 DIAGNOSIS — C3432 Malignant neoplasm of lower lobe, left bronchus or lung: Secondary | ICD-10-CM

## 2022-09-04 LAB — COMPREHENSIVE METABOLIC PANEL
ALT: 79 U/L — ABNORMAL HIGH (ref 0–44)
AST: 87 U/L — ABNORMAL HIGH (ref 15–41)
Albumin: 2.8 g/dL — ABNORMAL LOW (ref 3.5–5.0)
Alkaline Phosphatase: 357 U/L — ABNORMAL HIGH (ref 38–126)
Anion gap: 4 — ABNORMAL LOW (ref 5–15)
BUN: 8 mg/dL (ref 6–20)
CO2: 25 mmol/L (ref 22–32)
Calcium: 7.9 mg/dL — ABNORMAL LOW (ref 8.9–10.3)
Chloride: 105 mmol/L (ref 98–111)
Creatinine, Ser: 0.93 mg/dL (ref 0.61–1.24)
GFR, Estimated: >60 mL/min (ref >60)
Glucose, Bld: 118 mg/dL — ABNORMAL HIGH (ref 70–99)
Potassium: 3.3 mmol/L — ABNORMAL LOW (ref 3.5–5.1)
Sodium: 134 mmol/L — ABNORMAL LOW (ref 135–145)
Total Bilirubin: 0.5 mg/dL (ref 0.3–1.2)
Total Protein: 6.2 g/dL — ABNORMAL LOW (ref 6.5–8.1)

## 2022-09-04 LAB — CBC WITH DIFFERENTIAL/PLATELET
Abs Immature Granulocytes: 0.2 10*3/uL — ABNORMAL HIGH (ref 0.00–0.07)
Basophils Absolute: 0.2 10*3/uL — ABNORMAL HIGH (ref 0.0–0.1)
Basophils Relative: 2 %
Eosinophils Absolute: 0 10*3/uL (ref 0.0–0.5)
Eosinophils Relative: 0 %
HCT: 38.1 % — ABNORMAL LOW (ref 39.0–52.0)
Hemoglobin: 12.5 g/dL — ABNORMAL LOW (ref 13.0–17.0)
Immature Granulocytes: 2 %
Lymphocytes Relative: 10 %
Lymphs Abs: 0.9 10*3/uL (ref 0.7–4.0)
MCH: 28.4 pg (ref 26.0–34.0)
MCHC: 32.8 g/dL (ref 30.0–36.0)
MCV: 86.6 fL (ref 80.0–100.0)
Monocytes Absolute: 0.9 10*3/uL (ref 0.1–1.0)
Monocytes Relative: 10 %
Neutro Abs: 6.8 10*3/uL (ref 1.7–7.7)
Neutrophils Relative %: 76 %
Platelets: 268 10*3/uL (ref 150–400)
RBC: 4.4 MIL/uL (ref 4.22–5.81)
RDW: 15 % (ref 11.5–15.5)
WBC: 8.9 10*3/uL (ref 4.0–10.5)
nRBC: 0 % (ref 0.0–0.2)

## 2022-09-04 MED ORDER — GADOBUTROL 1 MMOL/ML IV SOLN
10.0000 mL | Freq: Once | INTRAVENOUS | Status: AC | PRN
  Administered 2022-09-04: 10 mL via INTRAVENOUS

## 2022-09-04 MED ORDER — HEPARIN SOD (PORK) LOCK FLUSH 100 UNIT/ML IV SOLN
500.0000 [IU] | Freq: Once | INTRAVENOUS | Status: AC
  Administered 2022-09-04: 500 [IU] via INTRAVENOUS
  Filled 2022-09-04: qty 5

## 2022-09-04 MED ORDER — POTASSIUM CHLORIDE IN NACL 20-0.9 MEQ/L-% IV SOLN
Freq: Once | INTRAVENOUS | Status: AC
  Filled 2022-09-04: qty 1000

## 2022-09-10 MED FILL — Dexamethasone Sodium Phosphate Inj 100 MG/10ML: INTRAMUSCULAR | Qty: 1 | Status: AC

## 2022-09-10 MED FILL — Fosaprepitant Dimeglumine For IV Infusion 150 MG (Base Eq): INTRAVENOUS | Qty: 5 | Status: AC

## 2022-09-10 NOTE — Addendum Note (Signed)
Addended by: [name] on: 09/10/2022 09:15 AM   Modules accepted: Orders

## 2022-09-11 ENCOUNTER — Inpatient Hospital Stay: Payer: Managed Care, Other (non HMO)

## 2022-09-11 ENCOUNTER — Encounter: Payer: Self-pay | Admitting: *Deleted

## 2022-09-11 ENCOUNTER — Inpatient Hospital Stay (HOSPITAL_BASED_OUTPATIENT_CLINIC_OR_DEPARTMENT_OTHER): Payer: Managed Care, Other (non HMO) | Admitting: Internal Medicine

## 2022-09-11 ENCOUNTER — Encounter: Payer: Self-pay | Admitting: Internal Medicine

## 2022-09-11 VITALS — BP 107/68 | HR 79 | Resp 16

## 2022-09-11 VITALS — BP 111/80 | HR 75 | Temp 95.4°F | Resp 20 | Wt 215.3 lb

## 2022-09-11 DIAGNOSIS — C3432 Malignant neoplasm of lower lobe, left bronchus or lung: Secondary | ICD-10-CM

## 2022-09-11 DIAGNOSIS — K769 Liver disease, unspecified: Secondary | ICD-10-CM

## 2022-09-11 LAB — CBC WITH DIFFERENTIAL/PLATELET
Abs Immature Granulocytes: 0.08 10*3/uL — ABNORMAL HIGH (ref 0.00–0.07)
Basophils Absolute: 0.2 10*3/uL — ABNORMAL HIGH (ref 0.0–0.1)
Basophils Relative: 2 %
Eosinophils Absolute: 0.1 10*3/uL (ref 0.0–0.5)
Eosinophils Relative: 1 %
HCT: 40.1 % (ref 39.0–52.0)
Hemoglobin: 13.2 g/dL (ref 13.0–17.0)
Immature Granulocytes: 1 %
Lymphocytes Relative: 14 %
Lymphs Abs: 1.1 10*3/uL (ref 0.7–4.0)
MCH: 28.9 pg (ref 26.0–34.0)
MCHC: 32.9 g/dL (ref 30.0–36.0)
MCV: 87.7 fL (ref 80.0–100.0)
Monocytes Absolute: 0.9 10*3/uL (ref 0.1–1.0)
Monocytes Relative: 12 %
Neutro Abs: 5.1 10*3/uL (ref 1.7–7.7)
Neutrophils Relative %: 70 %
Platelets: 300 10*3/uL (ref 150–400)
RBC: 4.57 MIL/uL (ref 4.22–5.81)
RDW: 16.2 % — ABNORMAL HIGH (ref 11.5–15.5)
WBC: 7.5 10*3/uL (ref 4.0–10.5)
nRBC: 0 % (ref 0.0–0.2)

## 2022-09-11 LAB — COMPREHENSIVE METABOLIC PANEL
ALT: 100 U/L — ABNORMAL HIGH (ref 0–44)
AST: 103 U/L — ABNORMAL HIGH (ref 15–41)
Albumin: 3 g/dL — ABNORMAL LOW (ref 3.5–5.0)
Alkaline Phosphatase: 336 U/L — ABNORMAL HIGH (ref 38–126)
Anion gap: 3 — ABNORMAL LOW (ref 5–15)
BUN: 9 mg/dL (ref 6–20)
CO2: 25 mmol/L (ref 22–32)
Calcium: 8.2 mg/dL — ABNORMAL LOW (ref 8.9–10.3)
Chloride: 106 mmol/L (ref 98–111)
Creatinine, Ser: 0.8 mg/dL (ref 0.61–1.24)
GFR, Estimated: >60 mL/min (ref >60)
Glucose, Bld: 90 mg/dL (ref 70–99)
Potassium: 4.2 mmol/L (ref 3.5–5.1)
Sodium: 134 mmol/L — ABNORMAL LOW (ref 135–145)
Total Bilirubin: 0.6 mg/dL (ref 0.3–1.2)
Total Protein: 6.9 g/dL (ref 6.5–8.1)

## 2022-09-11 LAB — URINALYSIS, DIPSTICK ONLY
Bilirubin Urine: NEGATIVE
Glucose, UA: NEGATIVE mg/dL
Hgb urine dipstick: NEGATIVE
Ketones, ur: NEGATIVE mg/dL
Leukocytes,Ua: NEGATIVE
Nitrite: NEGATIVE
Protein, ur: NEGATIVE mg/dL
Specific Gravity, Urine: 1.018 (ref 1.005–1.030)
pH: 5 (ref 5.0–8.0)

## 2022-09-11 MED ORDER — SODIUM CHLORIDE 0.9 % IV SOLN
15.0000 mg/kg | Freq: Once | INTRAVENOUS | Status: AC
  Administered 2022-09-11: 1600 mg via INTRAVENOUS
  Filled 2022-09-11: qty 64

## 2022-09-11 MED ORDER — SODIUM CHLORIDE 0.9 % IV SOLN
175.0000 mg/m2 | Freq: Once | INTRAVENOUS | Status: AC
  Administered 2022-09-11: 396 mg via INTRAVENOUS
  Filled 2022-09-11: qty 66

## 2022-09-11 MED ORDER — SODIUM CHLORIDE 0.9 % IV SOLN
Freq: Once | INTRAVENOUS | Status: AC
  Filled 2022-09-11: qty 250

## 2022-09-11 MED ORDER — DIPHENHYDRAMINE HCL 50 MG/ML IJ SOLN
50.0000 mg | Freq: Once | INTRAMUSCULAR | Status: AC
  Administered 2022-09-11: 50 mg via INTRAVENOUS
  Filled 2022-09-11: qty 1

## 2022-09-11 MED ORDER — SODIUM CHLORIDE 0.9% FLUSH
10.0000 mL | INTRAVENOUS | Status: DC | PRN
  Administered 2022-09-11: 10 mL
  Filled 2022-09-11: qty 10

## 2022-09-11 MED ORDER — HEPARIN SOD (PORK) LOCK FLUSH 100 UNIT/ML IV SOLN
500.0000 [IU] | Freq: Once | INTRAVENOUS | Status: AC | PRN
  Administered 2022-09-11: 500 [IU]
  Filled 2022-09-11: qty 5

## 2022-09-11 MED ORDER — SODIUM CHLORIDE 0.9 % IV SOLN
150.0000 mg | Freq: Once | INTRAVENOUS | Status: AC
  Administered 2022-09-11: 150 mg via INTRAVENOUS
  Filled 2022-09-11: qty 150

## 2022-09-11 MED ORDER — SODIUM CHLORIDE 0.9 % IV SOLN
10.0000 mg | Freq: Once | INTRAVENOUS | Status: AC
  Administered 2022-09-11: 10 mg via INTRAVENOUS
  Filled 2022-09-11: qty 10

## 2022-09-11 MED ORDER — FAMOTIDINE IN NACL 20-0.9 MG/50ML-% IV SOLN
20.0000 mg | Freq: Once | INTRAVENOUS | Status: AC
  Administered 2022-09-11: 20 mg via INTRAVENOUS
  Filled 2022-09-11: qty 50

## 2022-09-11 MED ORDER — SODIUM CHLORIDE 0.9 % IV SOLN
1200.0000 mg | Freq: Once | INTRAVENOUS | Status: AC
  Administered 2022-09-11: 1200 mg via INTRAVENOUS
  Filled 2022-09-11: qty 20

## 2022-09-11 MED ORDER — PALONOSETRON HCL INJECTION 0.25 MG/5ML
0.2500 mg | Freq: Once | INTRAVENOUS | Status: AC
  Administered 2022-09-11: 0.25 mg via INTRAVENOUS
  Filled 2022-09-11: qty 5

## 2022-09-11 MED ORDER — SODIUM CHLORIDE 0.9 % IV SOLN
700.0000 mg | Freq: Once | INTRAVENOUS | Status: AC
  Administered 2022-09-11: 700 mg via INTRAVENOUS
  Filled 2022-09-11: qty 70

## 2022-09-11 NOTE — Assessment & Plan Note (Addendum)
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 While on osimertinib 80 mg a day [since June 28th, 2022]-noted to have progressive disease.  NOV 1st, 2023- PET scan- shows progressive disease in liver.  New tracer avid upper abdominal lymph nodes compatible with nodal metastasis; New tracer avid nodule within the central left midlung compatible with concerning for locally recurrent tumor or metastatic disease.; Similar appearance of large loculated left pleural effusion with mild overlying tracer uptake; New small volume of ascites within the right iliac fossa and central pelvis with mild adjacent thickening of the peritoneal reflections. Progressive interlobular septal thickening within the right middle lobe and right lower lobe. Findings are nonspecific and may reflect asymmetric pulmonary edema. Lymphangitic spread of tumor is not excluded.  Recommend liver biopsy to rule out transformation. Unable to perform ultrasound-guided biopsy liver- see below. Repeat NGS testing.    Liquid Biopsy/Guardant testing-positive for EGFR mutation; otherwise Negative for any novel mutations.  Patient currently chemo-immunotherapy-carbo+ Taxol plus Avastin plus Atezo. D-2 neulasta.  Pending- repeat liver Biopsy. Will refer patient for second opinion at [facility] if interested.   # Proceed with cycle #2 of carbo Taxol-Atezo+Bev today.  labs today reviewed;  acceptable for treatment today. D-2 neulasta/home.   #Asymptomatic multiple brain mets subcentimeter asymptomatic -JUNE 29th, 2023- Numerous metastatic lesions in the supratentorial brain-increase in number also conspicuity compared to January 2023.  Pre-SBRT MRI brain AUG 18th, 2023-  Eighteen small enhancing brain metastases (punctate to 11 mm); were all present on 03/28/2021-s/p SBRT SEP 2023- [[place]].  August 15, 2022 brain MRI-overall stable.  Discussed with radiation oncology at [facility].  Repeat brain MRI in February.  #Elevated  LFTs: AST ALT 100 range; elevated alkaline phosphatase normal bilirubin-likely secondary progressive disease in the liver. NOV 2023- MRI liver- . Diffuse hepatic metastatic disease with numerous lesions throughout both lobes of the liver-await CT-guided liver biopsy.  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  Will plan Lovenox bridge prior to biopsy.  #Bone metastases- CT DEc 7th, 2022 to healing process.  Dental evaluation on 9/21.  Continue calcium vitamin D intake. calcium 8.5- HOLD for now.   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  Will refill- STABLE.  # IV access/:: port flush.   # [*home- neulasta- with cycle #2.   # DISPOSITION:  # Chemo [carbo-Taxol-Atezo+Bev] today.   # follow up in 1 week- APP- labs- cbc/cmp; possible IVFs over 1 hour  # follow up in 2 week- APP- labs- cbc/cmp; possible IVFs over 1 hour  # Follow up in 3 weeks- MD; labs- cbc/cmp; CEA; UA; D-2 carbo-Taxol-Atezo+Bev- Dr.B

## 2022-09-11 NOTE — Progress Notes (Signed)
Patient states he wants to talk MD about the liver biopsy. Patient states he wants to talk about starting the eliquis again because based on his scans he saw he had two blood clots.

## 2022-09-11 NOTE — Progress Notes (Signed)
Met with patient and his wife during follow up visit with Dr. Rogue Bussing. All questions answered during visit. Pt informed of liver biopsy scheduled for 12/15 at 10am. Pt given written instructions and informed to hold Eliquis 12/13-12/15 and may resume on 12/16. Informed that Dr. Jacinto Reap recommends lovenox injections while Eliquis is in hold prior to biopsy. Will call pt with instructions for lovenox later this week once received from Dr. Jacinto Reap. Instructed pt to call with any questions or needs. Pt verbalized understanding.   Referral to Dr. Aniceto Boss at [facility] faxed to seek second opinion. Pt aware that will receive call from [place] with his appt once scheduled. Pt verbalized understanding. Nothing further needed at this time.

## 2022-09-11 NOTE — Patient Instructions (Signed)
[facility] AT [place]  Discharge Instructions: Thank you for choosing [facility] to provide your oncology and hematology care.  If you have a lab appointment with the [facility], please go directly to the [facility] and check in at the registration area.  Wear comfortable clothing and clothing appropriate for easy access to any Portacath or PICC line.   We strive to give you quality time with your provider. You may need to reschedule your appointment if you arrive late (15 or more minutes).  Arriving late affects you and other patients whose appointments are after yours.  Also, if you miss three or more appointments without notifying the office, you may be dismissed from the clinic at the provider's discretion.      For prescription refill requests, have your pharmacy contact our office and allow 72 hours for refills to be completed.    Today you received the following chemotherapy and/or immunotherapy agents MVASI, TECENTRIQ, TAXOL. CARBOPLATIN      To help prevent nausea and vomiting after your treatment, we encourage you to take your nausea medication as directed.  BELOW ARE SYMPTOMS THAT SHOULD BE REPORTED IMMEDIATELY: *FEVER GREATER THAN 100.4 F (38 C) OR HIGHER *CHILLS OR SWEATING *NAUSEA AND VOMITING THAT IS NOT CONTROLLED WITH YOUR NAUSEA MEDICATION *UNUSUAL SHORTNESS OF BREATH *UNUSUAL BRUISING OR BLEEDING *URINARY PROBLEMS (pain or burning when urinating, or frequent urination) *BOWEL PROBLEMS (unusual diarrhea, constipation, pain near the anus) TENDERNESS IN MOUTH AND THROAT WITH OR WITHOUT PRESENCE OF ULCERS (sore throat, sores in mouth, or a toothache) UNUSUAL RASH, SWELLING OR PAIN  UNUSUAL VAGINAL DISCHARGE OR ITCHING   Items with * indicate a potential emergency and should be followed up as soon as possible or go to the Emergency Department if any problems should occur.  Please show the CHEMOTHERAPY ALERT CARD or IMMUNOTHERAPY  ALERT CARD at check-in to the Emergency Department and triage nurse.  Should you have questions after your visit or need to cancel or reschedule your appointment, please contact [facility] [place] AT [place]  [phone] and follow the prompts.  Office hours are 8:00 a.m. to 4:30 p.m. Monday - Friday. Please note that voicemails left after 4:00 p.m. may not be returned until the following business day.  We are closed weekends and major holidays. You have access to a nurse at all times for urgent questions. Please call the main number to the clinic [phone] and follow the prompts.  For any non-urgent questions, you may also contact your provider using MyChart. We now offer e-Visits for anyone 22 and older to request care online for non-urgent symptoms. For details visit [email or web address].   Also download the MyChart app! Go to the app store, search "MyChart", open the app, select [facility], and log in with your MyChart username and password.  Masks are optional in the cancer centers. If you would like for your care team to wear a mask while they are taking care of you, please let them know. For doctor visits, patients may have with them one support person who is at least 43 years old. At this time, visitors are not allowed in the infusion area.  Bevacizumab Injection What is this medication? BEVACIZUMAB (be va SIZ yoo mab) treats some types of cancer. It works by blocking a protein that causes cancer cells to grow and multiply. This helps to slow or stop the spread of cancer cells. It is a monoclonal antibody. This medicine may be used for  other purposes; ask your health care provider or pharmacist if you have questions. COMMON BRAND NAME(S): Alymsys, Avastin, MVASI, Noah Charon What should I tell my care team before I take this medication? They need to know if you have any of these conditions: Blood clots Coughing up blood Having or recent surgery Heart failure High blood  pressure History of a connection between 2 or more body parts that do not usually connect (fistula) History of a tear in your stomach or intestines Protein in your urine An unusual or allergic reaction to bevacizumab, other medications, foods, dyes, or preservatives Pregnant or trying to get pregnant Breast-feeding How should I use this medication? This medication is injected into a vein. It is given by your care team in a hospital or clinic setting. Talk to your care team the use of this medication in children. Special care may be needed. Overdosage: If you think you have taken too much of this medicine contact a poison control center or emergency room at once. NOTE: This medicine is only for you. Do not share this medicine with others. What if I miss a dose? Keep appointments for follow-up doses. It is important not to miss your dose. Call your care team if you are unable to keep an appointment. What may interact with this medication? Interactions are not expected. This list may not describe all possible interactions. Give your health care provider a list of all the medicines, herbs, non-prescription drugs, or dietary supplements you use. Also tell them if you smoke, drink alcohol, or use illegal drugs. Some items may interact with your medicine. What should I watch for while using this medication? Your condition will be monitored carefully while you are receiving this medication. You may need blood work while taking this medication. This medication may make you feel generally unwell. This is not uncommon as chemotherapy can affect healthy cells as well as cancer cells. Report any side effects. Continue your course of treatment even though you feel ill unless your care team tells you to stop. This medication may increase your risk to bruise or bleed. Call your care team if you notice any unusual bleeding. Before having surgery, talk to your care team to make sure it is ok. This medication can  increase the risk of poor healing of your surgical site or wound. You will need to stop this medication for 28 days before surgery. After surgery, wait at least 28 days before restarting this medication. Make sure the surgical site or wound is healed enough before restarting this medication. Talk to your care team if questions. Talk to your care team if you may be pregnant. Serious birth defects can occur if you take this medication during pregnancy and for 6 months after the last dose. Contraception is recommended while taking this medication and for 6 months after the last dose. Your care team can help you find the option that works for you. Do not breastfeed while taking this medication and for 6 months after the last dose. This medication can cause infertility. Talk to your care team if you are concerned about your fertility. What side effects may I notice from receiving this medication? Side effects that you should report to your care team as soon as possible: Allergic reactions--skin rash, itching, hives, swelling of the face, lips, tongue, or throat Bleeding--bloody or black, tar-like stools, vomiting blood or brown material that looks like coffee grounds, red or dark brown urine, small red or purple spots on skin, unusual bruising or bleeding  Blood clot--pain, swelling, or warmth in the leg, shortness of breath, chest pain Heart attack--pain or tightness in the chest, shoulders, arms, or jaw, nausea, shortness of breath, cold or clammy skin, feeling faint or lightheaded Heart failure--shortness of breath, swelling of the ankles, feet, or hands, sudden weight gain, unusual weakness or fatigue Increase in blood pressure Infection--fever, chills, cough, sore throat, wounds that don't heal, pain or trouble when passing urine, general feeling of discomfort or being unwell Infusion reactions--chest pain, shortness of breath or trouble breathing, feeling faint or lightheaded Kidney injury--decrease in  the amount of urine, swelling of the ankles, hands, or feet Stomach pain that is severe, does not go away, or gets worse Stroke--sudden numbness or weakness of the face, arm, or leg, trouble speaking, confusion, trouble walking, loss of balance or coordination, dizziness, severe headache, change in vision Sudden and severe headache, confusion, change in vision, seizures, which may be signs of posterior reversible encephalopathy syndrome (PRES) Side effects that usually do not require medical attention (report to your care team if they continue or are bothersome): Back pain Change in taste Diarrhea Dry skin Increased tears Nosebleed This list may not describe all possible side effects. Call your doctor for medical advice about side effects. You may report side effects to FDA at 1-800-FDA-1088. Where should I keep my medication? This medication is given in a hospital or clinic. It will not be stored at home. NOTE: This sheet is a summary. It may not cover all possible information. If you have questions about this medicine, talk to your doctor, pharmacist, or health care provider.  2023 Elsevier/Gold Standard (2022-02-02 00:00:00)  Atezolizumab Injection What is this medication? ATEZOLIZUMAB (a te zoe LIZ ue mab) treats some types of cancer. It works by helping your immune system slow or stop the spread of cancer cells. It is a monoclonal antibody. This medicine may be used for other purposes; ask your health care provider or pharmacist if you have questions. COMMON BRAND NAME(S): Tecentriq What should I tell my care team before I take this medication? They need to know if you have any of these conditions: Allogeneic stem cell transplant (uses someone else's stem cells) Autoimmune diseases, such as Crohn disease, ulcerative colitis, lupus History of chest radiation Nervous system problems, such as Guillain-Barre syndrome, myasthenia gravis Organ transplant An unusual or allergic reaction  to atezolizumab, other medications, foods, dyes, or preservatives Pregnant or trying to get pregnant Breast-feeding How should I use this medication? This medication is injected into a vein. It is given by your care team in a hospital or clinic setting. A special MedGuide will be given to you before each treatment. Be sure to read this information carefully each time. Talk to your care team about the use of this medication in children. While it may be prescribed for children as young as 2 years for selected conditions, precautions do apply. Overdosage: If you think you have taken too much of this medicine contact a poison control center or emergency room at once. NOTE: This medicine is only for you. Do not share this medicine with others. What if I miss a dose? Keep appointments for follow-up doses. It is important not to miss your dose. Call your care team if you are unable to keep an appointment. What may interact with this medication? Interactions have not been studied. This list may not describe all possible interactions. Give your health care provider a list of all the medicines, herbs, non-prescription drugs, or dietary supplements you  use. Also tell them if you smoke, drink alcohol, or use illegal drugs. Some items may interact with your medicine. What should I watch for while using this medication? Your condition will be monitored carefully while you are receiving this medication. You may need blood work while taking this medication. This medication may cause serious skin reactions. They can happen weeks to months after starting the medication. Contact your care team right away if you notice fevers or flu-like symptoms with a rash. The rash may be red or purple and then turn into blisters or peeling of the skin. You may also notice a red rash with swelling of the face, lips, or lymph nodes in your neck or under your arms. Tell your care team right away if you have any change in your  eyesight. Talk to your care team if you may be pregnant. Serious birth defects can occur if you take this medication during pregnancy and for 5 months after the last dose. You will need a negative pregnancy test before starting this medication. Contraception is recommended while taking this medication and for 5 months after the last dose. Your care team can help you find the option that works for you. Do not breastfeed while taking this medication and for at least 5 months after the last dose. What side effects may I notice from receiving this medication? Side effects that you should report to your doctor or health care professional as soon as possible: Allergic reactions--skin rash, itching, hives, swelling of the face, lips, tongue, or throat Dry cough, shortness of breath or trouble breathing Eye pain, redness, irritation, or discharge with blurry or decreased vision Heart muscle inflammation--unusual weakness or fatigue, shortness of breath, chest pain, fast or irregular heartbeat, dizziness, swelling of the ankles, feet, or hands Hormone gland problems--headache, sensitivity to light, unusual weakness or fatigue, dizziness, fast or irregular heartbeat, increased sensitivity to cold or heat, excessive sweating, constipation, hair loss, increased thirst or amount of urine, tremors or shaking, irritability Infusion reactions--chest pain, shortness of breath or trouble breathing, feeling faint or lightheaded Kidney injury (glomerulonephritis)--decrease in the amount of urine, red or dark brown urine, foamy or bubbly urine, swelling of the ankles, hands, or feet Liver injury--right upper belly pain, loss of appetite, nausea, light-colored stool, dark yellow or brown urine, yellowing skin or eyes, unusual weakness or fatigue Pain, tingling, or numbness in the hands or feet, muscle weakness, change in vision, confusion or trouble speaking, loss of balance or coordination, trouble walking, seizures Rash,  fever, and swollen lymph nodes Redness, blistering, peeling, or loosening of the skin, including inside the mouth Sudden or severe stomach pain, bloody diarrhea, fever, nausea, vomiting Side effects that usually do not require medical attention (report to your doctor or health care professional if they continue or are bothersome): Bone, joint, or muscle pain Diarrhea Fatigue Loss of appetite Nausea Skin rash This list may not describe all possible side effects. Call your doctor for medical advice about side effects. You may report side effects to FDA at 1-800-FDA-1088. Where should I keep my medication? This medication is given in a hospital or clinic. It will not be stored at home. NOTE: This sheet is a summary. It may not cover all possible information. If you have questions about this medicine, talk to your doctor, pharmacist, or health care provider.  2023 Elsevier/Gold Standard (2022-02-13 00:00:00)  Paclitaxel Injection What is this medication? PACLITAXEL (PAK li TAX el) treats some types of cancer. It works by slowing down  the growth of cancer cells. This medicine may be used for other purposes; ask your health care provider or pharmacist if you have questions. COMMON BRAND NAME(S): Onxol, Taxol What should I tell my care team before I take this medication? They need to know if you have any of these conditions: Heart disease Liver disease Low white blood cell levels An unusual or allergic reaction to paclitaxel, other medications, foods, dyes, or preservatives If you or your partner are pregnant or trying to get pregnant Breast-feeding How should I use this medication? This medication is injected into a vein. It is given by your care team in a hospital or clinic setting. Talk to your care team about the use of this medication in children. While it may be given to children for selected conditions, precautions do apply. Overdosage: If you think you have taken too much of this  medicine contact a poison control center or emergency room at once. NOTE: This medicine is only for you. Do not share this medicine with others. What if I miss a dose? Keep appointments for follow-up doses. It is important not to miss your dose. Call your care team if you are unable to keep an appointment. What may interact with this medication? Do not take this medication with any of the following: Live virus vaccines Other medications may affect the way this medication works. Talk with your care team about all of the medications you take. They may suggest changes to your treatment plan to lower the risk of side effects and to make sure your medications work as intended. This list may not describe all possible interactions. Give your health care provider a list of all the medicines, herbs, non-prescription drugs, or dietary supplements you use. Also tell them if you smoke, drink alcohol, or use illegal drugs. Some items may interact with your medicine. What should I watch for while using this medication? Your condition will be monitored carefully while you are receiving this medication. You may need blood work while taking this medication. This medication may make you feel generally unwell. This is not uncommon as chemotherapy can affect healthy cells as well as cancer cells. Report any side effects. Continue your course of treatment even though you feel ill unless your care team tells you to stop. This medication can cause serious allergic reactions. To reduce the risk, your care team may give you other medications to take before receiving this one. Be sure to follow the directions from your care team. This medication may increase your risk of getting an infection. Call your care team for advice if you get a fever, chills, sore throat, or other symptoms of a cold or flu. Do not treat yourself. Try to avoid being around people who are sick. This medication may increase your risk to bruise or bleed.  Call your care team if you notice any unusual bleeding. Be careful brushing or flossing your teeth or using a toothpick because you may get an infection or bleed more easily. If you have any dental work done, tell your dentist you are receiving this medication. Talk to your care team if you may be pregnant. Serious birth defects can occur if you take this medication during pregnancy. Talk to your care team before breastfeeding. Changes to your treatment plan may be needed. What side effects may I notice from receiving this medication? Side effects that you should report to your care team as soon as possible: Allergic reactions--skin rash, itching, hives, swelling of the face, lips,  tongue, or throat Heart rhythm changes--fast or irregular heartbeat, dizziness, feeling faint or lightheaded, chest pain, trouble breathing Increase in blood pressure Infection--fever, chills, cough, sore throat, wounds that don't heal, pain or trouble when passing urine, general feeling of discomfort or being unwell Low blood pressure--dizziness, feeling faint or lightheaded, blurry vision Low red blood cell level--unusual weakness or fatigue, dizziness, headache, trouble breathing Painful swelling, warmth, or redness of the skin, blisters or sores at the infusion site Pain, tingling, or numbness in the hands or feet Slow heartbeat--dizziness, feeling faint or lightheaded, confusion, trouble breathing, unusual weakness or fatigue Unusual bruising or bleeding Side effects that usually do not require medical attention (report to your care team if they continue or are bothersome): Diarrhea Hair loss Joint pain Loss of appetite Muscle pain Nausea Vomiting This list may not describe all possible side effects. Call your doctor for medical advice about side effects. You may report side effects to FDA at 1-800-FDA-1088. Where should I keep my medication? This medication is given in a hospital or clinic. It will not be  stored at home. NOTE: This sheet is a summary. It may not cover all possible information. If you have questions about this medicine, talk to your doctor, pharmacist, or health care provider.  2023 Elsevier/Gold Standard (2022-01-31 00:00:00)  Carboplatin Injection What is this medication? CARBOPLATIN (KAR boe pla tin) treats some types of cancer. It works by slowing down the growth of cancer cells. This medicine may be used for other purposes; ask your health care provider or pharmacist if you have questions. COMMON BRAND NAME(S): Paraplatin What should I tell my care team before I take this medication? They need to know if you have any of these conditions: Blood disorders Hearing problems Kidney disease Recent or ongoing radiation therapy An unusual or allergic reaction to carboplatin, cisplatin, other medications, foods, dyes, or preservatives Pregnant or trying to get pregnant Breast-feeding How should I use this medication? This medication is injected into a vein. It is given by your care team in a hospital or clinic setting. Talk to your care team about the use of this medication in children. Special care may be needed. Overdosage: If you think you have taken too much of this medicine contact a poison control center or emergency room at once. NOTE: This medicine is only for you. Do not share this medicine with others. What if I miss a dose? Keep appointments for follow-up doses. It is important not to miss your dose. Call your care team if you are unable to keep an appointment. What may interact with this medication? Medications for seizures Some antibiotics, such as amikacin, gentamicin, neomycin, streptomycin, tobramycin Vaccines This list may not describe all possible interactions. Give your health care provider a list of all the medicines, herbs, non-prescription drugs, or dietary supplements you use. Also tell them if you smoke, drink alcohol, or use illegal drugs. Some items  may interact with your medicine. What should I watch for while using this medication? Your condition will be monitored carefully while you are receiving this medication. You may need blood work while taking this medication. This medication may make you feel generally unwell. This is not uncommon, as chemotherapy can affect healthy cells as well as cancer cells. Report any side effects. Continue your course of treatment even though you feel ill unless your care team tells you to stop. In some cases, you may be given additional medications to help with side effects. Follow all directions for their use.  This medication may increase your risk of getting an infection. Call your care team for advice if you get a fever, chills, sore throat, or other symptoms of a cold or flu. Do not treat yourself. Try to avoid being around people who are sick. Avoid taking medications that contain aspirin, acetaminophen, ibuprofen, naproxen, or ketoprofen unless instructed by your care team. These medications may hide a fever. Be careful brushing or flossing your teeth or using a toothpick because you may get an infection or bleed more easily. If you have any dental work done, tell your dentist you are receiving this medication. Talk to your care team if you wish to become pregnant or think you might be pregnant. This medication can cause serious birth defects. Talk to your care team about effective forms of contraception. Do not breast-feed while taking this medication. What side effects may I notice from receiving this medication? Side effects that you should report to your care team as soon as possible: Allergic reactions--skin rash, itching, hives, swelling of the face, lips, tongue, or throat Infection--fever, chills, cough, sore throat, wounds that don't heal, pain or trouble when passing urine, general feeling of discomfort or being unwell Low red blood cell level--unusual weakness or fatigue, dizziness, headache,  trouble breathing Pain, tingling, or numbness in the hands or feet, muscle weakness, change in vision, confusion or trouble speaking, loss of balance or coordination, trouble walking, seizures Unusual bruising or bleeding Side effects that usually do not require medical attention (report to your care team if they continue or are bothersome): Hair loss Nausea Unusual weakness or fatigue Vomiting This list may not describe all possible side effects. Call your doctor for medical advice about side effects. You may report side effects to FDA at 1-800-FDA-1088. Where should I keep my medication? This medication is given in a hospital or clinic. It will not be stored at home. NOTE: This sheet is a summary. It may not cover all possible information. If you have questions about this medicine, talk to your doctor, pharmacist, or health care provider.  2023 [employer] (2022-01-15 00:00:00)

## 2022-09-11 NOTE — Progress Notes (Signed)
[place] NOTE  Patient Care Team: Pcp, No as PCP - General [name], RN as Oncology Nurse Navigator [name], MD as Consulting Physician (Internal Medicine)  CHIEF COMPLAINTS/PURPOSE OF CONSULTATION: Lung cancer  #  Oncology History Overview Note  IMPRESSION: 1. Patchy nodular fat stranding throughout the anterior left upper quadrant peritoneal fat, nonspecific, cannot exclude peritoneal carcinomatosis. Dedicated CT abdomen/pelvis with oral and IV contrast recommended for further evaluation. 2. Dense patchy consolidation replacing much of the left lower lung lobe, appearing masslike in the superior segment left lower lobe, with associated bulging of the left major fissure and associated left lower lobe volume loss. Fine nodularity throughout both lungs with an upper lobe predominance. Asymmetric left upper lobe interlobular septal thickening. These findings are indeterminate, with differential including multilobar pneumonia, sarcoidosis or a neoplastic process. The persistence on radiographs back to 01/20/2021 despite antibiotic therapy make sarcoidosis or a neoplastic process more likely. Pulmonology consultation suggested for consideration of bronchoscopic evaluation. 3. Small dependent left pleural effusion. 4. Mild mediastinal lymphadenopathy, nonspecific. 5. Subacute healing lateral right sixth rib fracture.   DIAGNOSIS:  A. LUNG, LEFT LOWER LOBE; ENB-ASSISTED BIOPSY:  - NON-SMALL CELL CARCINOMA, FAVOR ADENOCARCINOMA.  - FOREIGN MATERIAL SUGGESTIVE OF ASPIRATION.   # EGFR MUTATED: 14 del- June 28th, 2022- [name]. [limited]  # Asymptomatic multiple brain mets subcentimeter asymptomatic -JUNE 29th, 2023- Numerous metastatic lesions in the supratentorial brain-increase in number also conspicuity compared to January 2023.  Pre-SBRT MRI brain AUG 18th, 2023-  Eighteen small enhancing brain metastases (punctate to 11 mm); were all  present on 03/28/2021-s/p SBRT SEP 2023- [[place]]  # SEP-OCT-worsening left-sided pleural effusion-status post paracentesis; exudative; Cytology negative- ?  Progressive disease..   Cancer of lower lobe of left lung ([place])  03/23/2021 Initial Diagnosis   Cancer of lower lobe of left lung ([place])   03/29/2021 Cancer Staging   Staging form: Lung, AJCC 8th Edition - Clinical: Stage IVB (cT3, cN3, pM1c) - Signed by [name], MD on 03/29/2021   03/30/2021 - 03/30/2021 Chemotherapy   Patient is on Treatment Plan : LUNG NSCLC Pemetrexed ([place]) / Carboplatin q21d x 1 cycles     08/17/2022 -  Chemotherapy   Patient is on Treatment Plan : LUNG Atezolizumab + Bevacizumab + Carboplatin + Paclitaxel q21d Induction x 4 cycles / Atezolizumab + Bevacizumab q21d Maintenance      HISTORY OF PRESENTING ILLNESS: Ambulating independently.  With wife.   [name] 41 y.o.  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  Patient received cycle #1 approximately 3 weeks ago.  Patient needed IV fluids almost weekly posttreatment-mostly for fatigue/dehydration.  Patient had joint pains after his growth factor injection.  Currently resolved.   Appetite is better.  However has lost some weight.  Abdominal pain improved.  Denies any worsening headaches. Denies any worsening pain.  No worsening skin rash.  Denies any worsening tingling or numbness.  Review of Systems  Constitutional:  Positive for malaise/fatigue. Negative for chills, diaphoresis and fever.  HENT:  Negative for nosebleeds and sore throat.   Eyes:  Negative for double vision.  Respiratory:  Positive for cough. Negative for wheezing.   Cardiovascular:  Negative for chest pain, palpitations, orthopnea and leg swelling.  Gastrointestinal:  Positive for heartburn and nausea. Negative for  abdominal pain, blood in stool, diarrhea and melena.  Genitourinary:  Negative  for dysuria, frequency and urgency.  Musculoskeletal:  Negative for back pain and joint pain.  Skin:  Negative for itching.  Neurological:  Positive for dizziness, weakness and headaches. Negative for tingling and focal weakness.  Endo/Heme/Allergies:  Does not bruise/bleed easily.  Psychiatric/Behavioral:  Negative for depression. The patient is not nervous/anxious and does not have insomnia.      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    SURGICAL HISTORY: Past Surgical History:  Procedure Laterality Date   FRACTURE SURGERY     broke femur when he was 6 years   PORTA CATH INSERTION N/A 03/28/2021   Procedure: PORTA CATH INSERTION;  Surgeon: Katha Cabal, MD;  Location: [facility];  Service: Cardiovascular;  Laterality: N/A;   VIDEO BRONCHOSCOPY WITH ENDOBRONCHIAL NAVIGATION N/A 03/15/2021   Procedure: VIDEO BRONCHOSCOPY WITH ENDOBRONCHIAL NAVIGATION;  Surgeon: Ottie Glazier, MD;  Location: [facility];  Service: Thoracic;  Laterality: N/A;   VIDEO BRONCHOSCOPY WITH ENDOBRONCHIAL ULTRASOUND N/A 03/15/2021   Procedure: VIDEO BRONCHOSCOPY WITH ENDOBRONCHIAL ULTRASOUND;  Surgeon: Ottie Glazier, MD;  Location: [facility];  Service: Thoracic;  Laterality: N/A;    SOCIAL HISTORY: Social History   Socioeconomic History   Marital status: Married    Spouse name: [name]   Number of children: 2   Years of education: Not on file   Highest education level: Not on file  Occupational History   Not on file  Tobacco Use   Smoking status: Never   Smokeless tobacco: Former    Types: Snuff, Chew  Vaping Use   Vaping Use: Never used  Substance and Sexual Activity   Alcohol use: Never   Drug use: Never   Sexual activity: Yes  Other Topics Concern   Not on file  Social  History Narrative   [place]; with wife; 2 daughters[12 and 22]; never smoked; rare alcohol. 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   Social Determinants of Health   Financial Resource Strain: Low Risk  (05/10/2022)   Overall Financial Resource Strain (CARDIA)    Difficulty of Paying Living Expenses: Not hard at all  Food Insecurity: No Food Insecurity (05/10/2022)   Hunger Vital Sign    Worried About Running Out of Food in the Last Year: Never true    Ran Out of Food in the Last Year: Never true  Transportation Needs: No Transportation Needs (03/16/2022)   PRAPARE - Hydrologist (Medical): No    Lack of Transportation (Non-Medical): No  Physical Activity: Not on file  Stress: Not on file  Social Connections: Socially Integrated (05/10/2022)   Social Connection and Isolation Panel [NHANES]    Frequency of Communication with Friends and Family: More than three times a week    Frequency of Social Gatherings with Friends and Family: More than three times a week    Attends Religious Services: More than 4 times per year    Active Member of [employer] or Organizations: Yes    Attends Music therapist: More than 4 times per year    Marital Status: Married  Human resources officer Violence: Not on file    FAMILY HISTORY: Family History  Problem Relation Age of Onset   High Cholesterol Mother    Hypertension Father    Cancer Father    Lung  cancer Father    Skin cancer Father     ALLERGIES:  is allergic to codeine.  MEDICATIONS:  Current Outpatient Medications  Medication Sig Dispense Refill   apixaban (ELIQUIS) 5 MG TABS tablet Take 1 tablet (5 mg total) by mouth 2 (two) times daily. 60 tablet 2   folic acid (FOLVITE) 1 MG tablet Take 1 tablet (1 mg total) by mouth daily. 90 tablet 1   lidocaine-prilocaine (EMLA) cream Apply 1 application topically as needed. 30 g 0   loratadine (CLARITIN) 10 MG tablet Take 10 mg by mouth daily. For 5 days after the  treatment.     metoprolol succinate (TOPROL-XL) 50 MG 24 hr tablet Take 50 mg by mouth daily. Take with or immediately following a meal.     omeprazole (PRILOSEC) 20 MG capsule Take 1 capsule (20 mg total) by mouth daily. 30 capsule 2   ondansetron (ZOFRAN) 8 MG tablet One pill every 8 hours as needed for nausea/vomitting. 40 tablet 1   oxyCODONE (OXY IR/ROXICODONE) 5 MG immediate release tablet Take 1 tablet (5 mg total) by mouth every 4 (four) hours as needed for severe pain. 30 tablet 0   potassium chloride SA (KLOR-CON M) 20 MEQ tablet Take 1 tablet (20 mEq total) by mouth daily. 14 tablet 0   prochlorperazine (COMPAZINE) 10 MG tablet Take 1 tablet (10 mg total) by mouth every 6 (six) hours as needed for nausea or vomiting. 40 tablet 1   sertraline (ZOLOFT) 50 MG tablet TAKE 1/2 TABLET BY MOUTH DAILY 30 tablet 2   calcium carbonate (TUMS EX) 750 MG chewable tablet Chew 1 tablet by mouth daily. (Patient not taking: Reported on 08/27/2022)     dronabinol (MARINOL) 2.5 MG capsule Take 1 capsule (2.5 mg total) by mouth 2 (two) times daily before lunch and supper. (Patient not taking: Reported on 08/27/2022) 60 capsule 0   No current facility-administered medications for this visit.   Facility-Administered Medications Ordered in Other Visits  Medication Dose Route Frequency Provider Last Rate Last Admin   atezolizumab (TECENTRIQ) 1,200 mg in sodium chloride 0.9 % 250 mL chemo infusion  1,200 mg Intravenous Once Charlaine Dalton R, MD       bevacizumab-awwb (MVASI) 1,600 mg in sodium chloride 0.9 % 100 mL chemo infusion  15 mg/kg (Treatment Plan Recorded) Intravenous Once [name], MD       CARBOplatin (PARAPLATIN) 700 mg in sodium chloride 0.9 % 250 mL chemo infusion  700 mg Intravenous Once [name], MD       dexamethasone (DECADRON) 10 mg in sodium chloride 0.9 % 50 mL IVPB  10 mg Intravenous Once [name], MD       diphenhydrAMINE (BENADRYL) injection 50  mg  50 mg Intravenous Once [name], MD       famotidine (PEPCID) IVPB 20 mg premix  20 mg Intravenous Once Charlaine Dalton R, MD       fosaprepitant (EMEND) 150 mg in sodium chloride 0.9 % 145 mL IVPB  150 mg Intravenous Once [name], MD 450 mL/hr at 09/11/22 0949 150 mg at 09/11/22 0949   heparin lock flush 100 UNIT/ML injection            heparin lock flush 100 UNIT/ML injection            heparin lock flush 100 unit/mL  500 Units Intracatheter Once PRN [name], MD       PACLitaxel (TAXOL) 396 mg  in sodium chloride 0.9 % 500 mL chemo infusion (> 55m/m2)  175 mg/m2 (Treatment Plan Recorded) Intravenous Once BCammie Sickle MD       palonosetron (ALOXI) injection 0.25 mg  0.25 mg Intravenous Once BCharlaine DaltonR, MD       sodium chloride flush (NS) 0.9 % injection 10 mL  10 mL Intravenous PRN BCharlaine DaltonR, MD   10 mL at 07/28/21 0852   sodium chloride flush (NS) 0.9 % injection 10 mL  10 mL Intracatheter PRN BCammie Sickle MD          .  PHYSICAL EXAMINATION: ECOG PERFORMANCE STATUS: 1 - Symptomatic but completely ambulatory  Vitals:   09/11/22 0839  BP: 111/80  Pulse: 75  Resp: 20  Temp: (!) 95.4 F (35.2 C)  SpO2: 100%      Filed Weights   09/11/22 0839  Weight: 215 lb 4.8 oz (97.7 kg)        Physical Exam HENT:     Head: Normocephalic and atraumatic.     Mouth/Throat:     Pharynx: No oropharyngeal exudate.  Eyes:     Pupils: Pupils are equal, round, and reactive to light.  Cardiovascular:     Rate and Rhythm: Normal rate and regular rhythm.  Pulmonary:     Effort: No respiratory distress.     Breath sounds: No wheezing.     Comments: Decreased breath sound on the left side compared to right. Abdominal:     General: Bowel sounds are normal. There is no distension.     Palpations: Abdomen is soft. There is no mass.     Tenderness: There is no abdominal tenderness. There is no guarding  or rebound.  Musculoskeletal:        General: No tenderness. Normal range of motion.     Cervical back: Normal range of motion and neck supple.  Skin:    General: Skin is warm.  Neurological:     Mental Status: He is alert and oriented to person, place, and time.  Psychiatric:        Mood and Affect: Affect normal.    Fungal dermatitis bilateral buttocks groin/underneath abdominal pannus  LABORATORY DATA:  I have reviewed the data as listed Lab Results  Component Value Date   WBC 7.5 09/11/2022   HGB 13.2 09/11/2022   HCT 40.1 09/11/2022   MCV 87.7 09/11/2022   PLT 300 09/11/2022   Recent Labs    08/27/22 0837 09/04/22 0900 09/11/22 0818  NA 134* 134* 134*  K 3.2* 3.3* 4.2  CL 99 105 106  CO2 _0 GLUCOSE 100* 118* 90  BUN _1 CREATININE 0.97 0.93 0.80  CALCIUM 8.0* 7.9* 8.2*  GFRNONAA >60 >60 >60  PROT 6.2* 6.2* 6.9  ALBUMIN 2.9* 2.8* 3.0*  AST 87* 87* 103*  ALT 77* 79* 100*  ALKPHOS 351* 357* 336*  BILITOT 0.5 0.5 0.6    RADIOGRAPHIC STUDIES: I have personally reviewed the radiological images as listed and agreed with the findings in the report. MR LIVER W WO CONTRAST  Result Date: 09/06/2022 CLINICAL DATA:  Metastatic non-small cell lung cancer follow-up liver lesions seen on recent PET-CT. EXAM: MRI ABDOMEN WITHOUT AND WITH CONTRAST TECHNIQUE: Multiplanar multisequence MR imaging of the abdomen was performed both before and after the administration of intravenous contrast. CONTRAST:  138mGADAVIST GADOBUTROL 1 MMOL/ML IV SOLN COMPARISON:  PET-CT 08/15/2022 FINDINGS: Lower chest: Large complex left pleural fluid collection.  No obvious right-sided pulmonary lesions. No pericardial effusion. Hepatobiliary: Diffuse hepatic metastatic disease with numerous lesions throughout both lobes of the liver. The large is lesion in the left lobe measures 4.5 x 3.7 cm. The largest lesion in the right lobe and segment 6 measures 3.7 x 2.3 cm. 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 The gallbladder is contracted and surrounded by fluid. No common bile duct dilatation. Pancreas:  No mass, inflammation or ductal dilatation. Spleen:  Normal size. No focal lesions. The splenic vein is patent. Adrenals/Urinary Tract:  Adrenal glands and kidneys are. Stomach/Bowel: The stomach, duodenum, visualized small visualized colon are grossly. Vascular/Lymphatic: The aorta and branch vessels are normal. The major venous structures are patent except for the portal vein as detailed above. Periportal adenopathy as demonstrated on the PET scan. Other:  Small volume ascites. Musculoskeletal: No significant bony findings. IMPRESSION: 1. Diffuse hepatic metastatic disease with numerous lesions throughout both lobes of the liver. 2. 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 3. Periportal adenopathy as demonstrated on the PET scan. 4. Large complex left pleural fluid collection. 5. Small volume ascites. Electronically Signed   By: Marijo Sanes M.D.   On: 09/06/2022 11:48   US Abdomen Limited  Result Date: 08/31/2022 CLINICAL DATA:  Liver mass on PET EXAM: ULTRASOUND ABDOMEN LIMITED RIGHT UPPER QUADRANT COMPARISON:  PET-CT August 15, 2022 FINDINGS: Limited ultrasound of the liver was performed for biopsy planning purposes. Sonographic images of the liver do not demonstrate a focal liver lesion amenable for percutaneous biopsy. An ultrasound correlate to the findings on recent PET-CT was not identified. No biopsy performed. IMPRESSION: No focal liver lesion identified by ultrasound correlating to recent findings on PET-CT. Further evaluation of these liver lesions with contrast-enhanced MRI is recommended. This was communicated to the referring Oncology team. Electronically Signed   By: Albin Felling M.D.   On: 08/31/2022 13:57   NM PET  Image Restage (PS) Skull Base to Thigh (F-18 FDG)  Result Date: 08/16/2022 CLINICAL DATA:  Subsequent treatment strategy for recurrent non-small cell lung cancer. EXAM: NUCLEAR MEDICINE PET SKULL BASE TO THIGH TECHNIQUE: 12.26 mCi F-18 FDG was injected intravenously. Full-ring PET imaging was performed from the skull base to thigh after the radiotracer. CT data was obtained and used for attenuation correction and anatomic localization. Fasting blood glucose: 90 mg/dl COMPARISON:  PET-CT from 01/31/2022 FINDINGS: Mediastinal blood pool activity: SUV max 1.95 Liver activity: SUV max NA NECK: No hypermetabolic lymph nodes in the neck. Incidental CT findings: None. CHEST: No tracer avid axillary, supraclavicular, or mediastinal adenopathy. There is a large loculated left pleural effusion which appears similar in volume to the previous exam. There is overlying mild increased tracer uptake within the overlying pleural with SUV max of 1.89 on today's study versus 3.25 previously. Central left midlung tracer avid lung nodule is identified just lateral to the descending aorta measuring 1.1 cm with SUV max of 7.37. There is a tiny nodule in the right upper lobe measuring 6 mm which is too small to reliably characterize, image 82/2. Unchanged subpleural nodule in the right middle lobe measuring 6 mm, also too small to characterize, image 115/2. Progressive interlobular septal thickening is identified within the right middle lobe and right lower lobe. Incidental CT findings: Mild cardiac enlargement. No pericardial effusion. ABDOMEN/PELVIS: Multiple ill-defined foci of abnormal increased radiotracer uptake  identified within both lobes of liver, new compared with the previous exam: Index lesions include: Within the lateral segment of left hepatic lobe there is a focal area of low-density measuring 5 x 3.6 cm with SUV max of 7.19, image 133/2. Within the inferior right lobe of liver there is an ill-defined area of low  attenuation measuring 5.9 x 3.0 cm with SUV max of 9.12, image 162/2. Within the lateral aspect posterior right lobe of liver there is a focal area of increased uptake measuring approximately 3.26 cm with SUV max of 9.49. Margins of this lesion are difficult to visualize on the unenhanced CT images. Portacaval lymph node is tracer avid measuring 1 cm with SUV max of 4.86, image 151/2. New from previous exam Adjacent porta hepatic node measures 1.1 cm with SUV max of 4.51, image 147/2. New from previous exam. Mildly tracer avid aortocaval lymph node measures 7 mm with SUV max of 3.12. New from the previous exam. No tracer avid pelvic or inguinal lymph nodes. New small volume of ascites identified within the right iliac fossa and central pelvis with mild adjacent thickening of the peritoneal reflections. No tracer avid peritoneal nodule identified. Incidental CT findings: None. SKELETON: No focal hypermetabolic activity to suggest skeletal metastasis. Incidental CT findings: None. IMPRESSION: 1. Interval development of multifocal tracer avid liver lesions compatible with metastatic disease. 2. New tracer avid upper abdominal lymph nodes compatible with nodal metastasis. 3. New tracer avid nodule within the central left midlung compatible with concerning for locally recurrent tumor or metastatic disease. 4. Similar appearance of large loculated left pleural effusion with mild overlying tracer uptake. 5. New small volume of ascites within the right iliac fossa and central pelvis with mild adjacent thickening of the peritoneal reflections. No tracer avid peritoneal nodule identified. Correlate for any clinical signs or symptoms of early peritoneal disease. 6. Progressive interlobular septal thickening within the right middle lobe and right lower lobe. Findings are nonspecific and may reflect asymmetric pulmonary edema. Lymphangitic spread of tumor is not excluded. Electronically Signed   By: Kerby Moors M.D.   On:  08/16/2022 09:11   MR Brain W Wo Contrast  Result Date: 08/16/2022 CLINICAL DATA:  Non-small-cell lung cancer. 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 Assess response to treatment. EXAM: MRI HEAD WITHOUT AND WITH CONTRAST TECHNIQUE: Multiplanar, multiecho pulse sequences of the brain and surrounding structures were obtained without and with intravenous contrast. CONTRAST:  45m GADAVIST GADOBUTROL 1 MMOL/ML IV SOLN COMPARISON:  MRI head 05/29/2022 FINDINGS: Brain: Numerous small enhancing lesions in the brain compatible with widespread metastatic disease. Identified lesions are stable to smaller. No progression in size and no new lesions identified. There is however increased edema in the left posterior frontal white matter associated with metastatic disease. Mild sulcal FLAIR hyperintensity posteriorly has developed since the prior study. No abnormal enhancement. Ventricle size normal. No acute infarct. Mild hemorrhage associated with metastatic deposits in the right parietal lobe and left posterior frontal lobe and right medial frontal lobe unchanged. Vascular: Normal arterial flow voids. Skull and upper cervical spine: No skeletal metastasis. Sinuses/Orbits: Paranasal sinuses clear.  Negative orbit Other: None IMPRESSION: 1. Numerous small enhancing lesions in the brain compatible with metastatic disease. These are stable to smaller compared with the prior study. No new lesions identified. 2. There is increased edema in the left posterior frontal white matter associated with metastatic disease. 3. Mild sulcal FLAIR hyperintensity posteriorly has developed since the prior study. This could be due to subarachnoid hemorrhage, supplemental oxygen  therapy, or possibly leptomeningeal carcinomatosis. Attention on follow-up. Electronically Signed   By: Franchot Gallo M.D.   On: 08/16/2022 09:10    ASSESSMENT & PLAN:   Cancer of lower lobe of left lung ([place]) # STAGE IV- Left lower lobe lung cancer-adenocarcinoma the lung;   EGFR- 19 del POSITIVE. While on osimertinib 80 mg a day [since June 28th, 2022]-noted to have progressive disease.  NOV 1st, 2023- PET scan- shows progressive disease in liver.  New tracer avid upper abdominal lymph nodes compatible with nodal metastasis; New tracer avid nodule within the central left midlung compatible with concerning for locally recurrent tumor or metastatic disease.; Similar appearance of large loculated left pleural effusion with mild overlying tracer uptake; New small volume of ascites within the right iliac fossa and central pelvis with mild adjacent thickening of the peritoneal reflections. Progressive interlobular septal thickening within the right middle lobe and right lower lobe. Findings are nonspecific and may reflect asymmetric pulmonary edema. Lymphangitic spread of tumor is not excluded.  Recommend liver biopsy to rule out transformation. Unable to perform ultrasound-guided biopsy liver- see below. Repeat NGS testing.    Liquid Biopsy/Guardant testing-positive for EGFR mutation; otherwise Negative for any novel mutations.  Patient currently chemo-immunotherapy-carbo+ Taxol plus Avastin plus Atezo. D-2 neulasta.  Pending- repeat liver Biopsy. Will refer patient for second opinion at [facility] if interested.   # Proceed with cycle #2 of carbo Taxol-Atezo+Bev today.  labs today reviewed;  acceptable for treatment today. D-2 neulasta/home.   #Asymptomatic multiple brain mets subcentimeter asymptomatic -JUNE 29th, 2023- Numerous metastatic lesions in the supratentorial brain-increase in number also conspicuity compared to January 2023.  Pre-SBRT MRI brain AUG 18th, 2023-  Eighteen small enhancing brain metastases (punctate to 11 mm); were all present on 03/28/2021-s/p SBRT SEP 2023- [[place]].  August 15, 2022 brain MRI-overall stable.  Discussed with radiation oncology at [facility].  Repeat brain MRI in February.  #Elevated LFTs: AST ALT 100 range; elevated alkaline phosphatase normal  bilirubin-likely secondary progressive disease in the liver. NOV 2023- MRI liver- . Diffuse hepatic metastatic disease with numerous lesions throughout both lobes of the liver-await CT-guided liver biopsy.  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  Will plan Lovenox bridge prior to biopsy.  #Bone metastases- CT DEc 7th, 2022 to healing process.  Dental evaluation on 9/21.  Continue calcium vitamin D intake. calcium 8.5- HOLD for now.   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  Will refill- STABLE.  # IV access/:: port flush.   # [*home- neulasta- with cycle #2.   # DISPOSITION:  # Chemo [carbo-Taxol-Atezo+Bev] today.   # follow up in 1 week- APP- labs- cbc/cmp; possible IVFs over 1 hour  # follow up in 2 week- APP- labs- cbc/cmp; possible IVFs over 1 hour  # Follow up in 3 weeks- MD; labs- cbc/cmp; CEA; UA; D-2 carbo-Taxol-Atezo+Bev- Dr.B            All questions were answered. The patient knows to call the clinic with any problems, questions or concerns.    [name], MD 09/11/2022 9:49 AM

## 2022-09-12 LAB — CEA: CEA: 10.5 ng/mL — ABNORMAL HIGH (ref 0.0–4.7)

## 2022-09-13 ENCOUNTER — Inpatient Hospital Stay: Admission: RE | Admit: 2022-09-13 | Payer: Managed Care, Other (non HMO) | Source: Ambulatory Visit

## 2022-09-18 ENCOUNTER — Encounter: Payer: Self-pay | Admitting: Nurse Practitioner

## 2022-09-18 ENCOUNTER — Ambulatory Visit: Payer: Self-pay | Admitting: Radiation Oncology

## 2022-09-18 ENCOUNTER — Inpatient Hospital Stay: Payer: Managed Care, Other (non HMO)

## 2022-09-18 ENCOUNTER — Inpatient Hospital Stay: Payer: Managed Care, Other (non HMO) | Attending: Internal Medicine

## 2022-09-18 ENCOUNTER — Inpatient Hospital Stay (HOSPITAL_BASED_OUTPATIENT_CLINIC_OR_DEPARTMENT_OTHER): Payer: Managed Care, Other (non HMO) | Admitting: Nurse Practitioner

## 2022-09-18 VITALS — BP 122/85 | HR 88 | Temp 98.0°F | Ht 70.0 in | Wt 220.0 lb

## 2022-09-18 DIAGNOSIS — C787 Secondary malignant neoplasm of liver and intrahepatic bile duct: Secondary | ICD-10-CM | POA: Insufficient documentation

## 2022-09-18 DIAGNOSIS — I81 Portal vein thrombosis: Secondary | ICD-10-CM | POA: Diagnosis not present

## 2022-09-18 DIAGNOSIS — R188 Other ascites: Secondary | ICD-10-CM | POA: Diagnosis not present

## 2022-09-18 DIAGNOSIS — Z86718 Personal history of other venous thrombosis and embolism: Secondary | ICD-10-CM | POA: Diagnosis not present

## 2022-09-18 DIAGNOSIS — Z7901 Long term (current) use of anticoagulants: Secondary | ICD-10-CM | POA: Diagnosis not present

## 2022-09-18 DIAGNOSIS — Z5111 Encounter for antineoplastic chemotherapy: Secondary | ICD-10-CM | POA: Insufficient documentation

## 2022-09-18 DIAGNOSIS — K5900 Constipation, unspecified: Secondary | ICD-10-CM | POA: Insufficient documentation

## 2022-09-18 DIAGNOSIS — C3432 Malignant neoplasm of lower lobe, left bronchus or lung: Secondary | ICD-10-CM | POA: Insufficient documentation

## 2022-09-18 DIAGNOSIS — C7931 Secondary malignant neoplasm of brain: Secondary | ICD-10-CM | POA: Diagnosis not present

## 2022-09-18 DIAGNOSIS — R7989 Other specified abnormal findings of blood chemistry: Secondary | ICD-10-CM | POA: Insufficient documentation

## 2022-09-18 DIAGNOSIS — C7951 Secondary malignant neoplasm of bone: Secondary | ICD-10-CM | POA: Insufficient documentation

## 2022-09-18 DIAGNOSIS — Z5112 Encounter for antineoplastic immunotherapy: Secondary | ICD-10-CM | POA: Insufficient documentation

## 2022-09-18 DIAGNOSIS — Z5181 Encounter for therapeutic drug level monitoring: Secondary | ICD-10-CM

## 2022-09-18 DIAGNOSIS — Z452 Encounter for adjustment and management of vascular access device: Secondary | ICD-10-CM | POA: Insufficient documentation

## 2022-09-18 DIAGNOSIS — Z79899 Other long term (current) drug therapy: Secondary | ICD-10-CM | POA: Diagnosis not present

## 2022-09-18 DIAGNOSIS — D702 Other drug-induced agranulocytosis: Secondary | ICD-10-CM | POA: Insufficient documentation

## 2022-09-18 LAB — COMPREHENSIVE METABOLIC PANEL
ALT: 97 U/L — ABNORMAL HIGH (ref 0–44)
AST: 76 U/L — ABNORMAL HIGH (ref 15–41)
Albumin: 3.2 g/dL — ABNORMAL LOW (ref 3.5–5.0)
Alkaline Phosphatase: 260 U/L — ABNORMAL HIGH (ref 38–126)
Anion gap: 9 (ref 5–15)
BUN: 9 mg/dL (ref 6–20)
CO2: 26 mmol/L (ref 22–32)
Calcium: 8.5 mg/dL — ABNORMAL LOW (ref 8.9–10.3)
Chloride: 102 mmol/L (ref 98–111)
Creatinine, Ser: 0.91 mg/dL (ref 0.61–1.24)
GFR, Estimated: >60 mL/min (ref >60)
Glucose, Bld: 129 mg/dL — ABNORMAL HIGH (ref 70–99)
Potassium: 3.8 mmol/L (ref 3.5–5.1)
Sodium: 137 mmol/L (ref 135–145)
Total Bilirubin: 0.5 mg/dL (ref 0.3–1.2)
Total Protein: 6.9 g/dL (ref 6.5–8.1)

## 2022-09-18 LAB — CBC WITH DIFFERENTIAL/PLATELET
Abs Immature Granulocytes: 0.05 10*3/uL (ref 0.00–0.07)
Basophils Absolute: 0.1 10*3/uL (ref 0.0–0.1)
Basophils Relative: 5 %
Eosinophils Absolute: 0.4 10*3/uL (ref 0.0–0.5)
Eosinophils Relative: 13 %
HCT: 40.4 % (ref 39.0–52.0)
Hemoglobin: 13.3 g/dL (ref 13.0–17.0)
Immature Granulocytes: 2 %
Lymphocytes Relative: 31 %
Lymphs Abs: 0.9 10*3/uL (ref 0.7–4.0)
MCH: 28.9 pg (ref 26.0–34.0)
MCHC: 32.9 g/dL (ref 30.0–36.0)
MCV: 87.8 fL (ref 80.0–100.0)
Monocytes Absolute: 0.5 10*3/uL (ref 0.1–1.0)
Monocytes Relative: 18 %
Neutro Abs: 0.8 10*3/uL — ABNORMAL LOW (ref 1.7–7.7)
Neutrophils Relative %: 31 %
Platelets: 126 10*3/uL — ABNORMAL LOW (ref 150–400)
RBC: 4.6 MIL/uL (ref 4.22–5.81)
RDW: 16 % — ABNORMAL HIGH (ref 11.5–15.5)
Smear Review: NORMAL
WBC: 2.7 10*3/uL — ABNORMAL LOW (ref 4.0–10.5)
nRBC: 0 % (ref 0.0–0.2)

## 2022-09-18 MED ORDER — HEPARIN SOD (PORK) LOCK FLUSH 100 UNIT/ML IV SOLN
500.0000 [IU] | Freq: Once | INTRAVENOUS | Status: AC
  Administered 2022-09-18: 500 [IU] via INTRAVENOUS
  Filled 2022-09-18: qty 5

## 2022-09-18 MED ORDER — SODIUM CHLORIDE 0.9 % IV SOLN
Freq: Once | INTRAVENOUS | Status: AC
  Filled 2022-09-18: qty 250

## 2022-09-18 MED ORDER — ENOXAPARIN SODIUM 100 MG/ML IJ SOSY: PREFILLED_SYRINGE | INTRAMUSCULAR | 0 refills | Status: DC

## 2022-09-18 MED ORDER — GABAPENTIN 100 MG PO CAPS: 100.0000 mg | ORAL_CAPSULE | Freq: Every day | ORAL | 1 refills | Status: DC

## 2022-09-18 MED ORDER — SODIUM CHLORIDE 0.9% FLUSH
10.0000 mL | Freq: Once | INTRAVENOUS | Status: AC
  Administered 2022-09-18: 10 mL via INTRAVENOUS
  Filled 2022-09-18: qty 10

## 2022-09-18 NOTE — Progress Notes (Signed)
[facility] NOTE  Patient Care Team: Pcp, No as PCP - General [name], RN as Oncology Nurse Navigator [name], MD as Consulting Physician (Internal Medicine)  CHIEF COMPLAINTS/PURPOSE OF CONSULTATION: Lung cancer  #  Oncology History Overview Note  IMPRESSION: 1. Patchy nodular fat stranding throughout the anterior left upper quadrant peritoneal fat, nonspecific, cannot exclude peritoneal carcinomatosis. Dedicated CT abdomen/pelvis with oral and IV contrast recommended for further evaluation. 2. Dense patchy consolidation replacing much of the left lower lung lobe, appearing masslike in the superior segment left lower lobe, with associated bulging of the left major fissure and associated left lower lobe volume loss. Fine nodularity throughout both lungs with an upper lobe predominance. Asymmetric left upper lobe interlobular septal thickening. These findings are indeterminate, with differential including multilobar pneumonia, sarcoidosis or a neoplastic process. The persistence on radiographs back to 01/20/2021 despite antibiotic therapy make sarcoidosis or a neoplastic process more likely. Pulmonology consultation suggested for consideration of bronchoscopic evaluation. 3. Small dependent left pleural effusion. 4. Mild mediastinal lymphadenopathy, nonspecific. 5. Subacute healing lateral right sixth rib fracture.   DIAGNOSIS:  A. LUNG, LEFT LOWER LOBE; ENB-ASSISTED BIOPSY:  - NON-SMALL CELL CARCINOMA, FAVOR ADENOCARCINOMA.  - FOREIGN MATERIAL SUGGESTIVE OF ASPIRATION.   # EGFR MUTATED: 76 del- June 28th, 2022- [name]. [limited]  # Asymptomatic multiple brain mets subcentimeter asymptomatic -JUNE 29th, 2023- Numerous metastatic lesions in the supratentorial brain-increase in number also conspicuity compared to January 2023.  Pre-SBRT MRI brain AUG 18th, 2023-  Eighteen small enhancing brain metastases (punctate to 11 mm); were all  present on 03/28/2021-s/p SBRT SEP 2023- [[place]]  # SEP-OCT-worsening left-sided pleural effusion-status post paracentesis; exudative; Cytology negative- ?  Progressive disease..   Cancer of lower lobe of left lung ([place])  03/23/2021 Initial Diagnosis   Cancer of lower lobe of left lung ([place])   03/29/2021 Cancer Staging   Staging form: Lung, AJCC 8th Edition - Clinical: Stage IVB (cT3, cN3, pM1c) - Signed by [name], MD on 03/29/2021   03/30/2021 - 03/30/2021 Chemotherapy   Patient is on Treatment Plan : LUNG NSCLC Pemetrexed ([place]) / Carboplatin q21d x 1 cycles     08/17/2022 -  Chemotherapy   Patient is on Treatment Plan : LUNG Atezolizumab + Bevacizumab + Carboplatin + Paclitaxel q21d Induction x 4 cycles / Atezolizumab + Bevacizumab q21d Maintenance      HISTORY OF PRESENTING ILLNESS: Ambulating independently.  With wife.   [name] 41 y.o.  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   Today, he complains of aching, particularly in his feet, after chemotherapy. Intermittent improvement with gabapentin. Also complains of constipation.    Review of Systems  Constitutional:  Positive for malaise/fatigue. Negative for chills, diaphoresis and fever.  HENT:  Negative for nosebleeds and sore throat.   Eyes:  Negative for double vision.  Respiratory:  Positive for cough. Negative for wheezing.   Cardiovascular:  Negative for chest pain, palpitations, orthopnea and leg swelling.  Gastrointestinal:  Positive for heartburn and nausea. Negative for abdominal pain, blood in stool, diarrhea and melena.  Genitourinary:  Negative for dysuria, frequency and urgency.  Musculoskeletal:  Negative for back pain and joint pain.  Skin:  Negative for itching.  Neurological:  Positive for  dizziness, weakness and headaches. Negative for tingling and focal weakness.  Endo/Heme/Allergies:  Does not bruise/bleed easily.  Psychiatric/Behavioral:  Negative for depression. The patient is not nervous/anxious and does not have insomnia.      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    SURGICAL HISTORY: Past Surgical History:  Procedure Laterality Date   FRACTURE SURGERY     broke femur when he was 6 years   PORTA CATH INSERTION N/A 03/28/2021   Procedure: PORTA CATH INSERTION;  Surgeon: Katha Cabal, MD;  Location: [facility];  Service: Cardiovascular;  Laterality: N/A;   VIDEO BRONCHOSCOPY WITH ENDOBRONCHIAL NAVIGATION N/A 03/15/2021   Procedure: VIDEO BRONCHOSCOPY WITH ENDOBRONCHIAL NAVIGATION;  Surgeon: Ottie Glazier, MD;  Location: [facility];  Service: Thoracic;  Laterality: N/A;   VIDEO BRONCHOSCOPY WITH ENDOBRONCHIAL ULTRASOUND N/A 03/15/2021   Procedure: VIDEO BRONCHOSCOPY WITH ENDOBRONCHIAL ULTRASOUND;  Surgeon: Ottie Glazier, MD;  Location: [facility];  Service: Thoracic;  Laterality: N/A;    SOCIAL HISTORY: Social History   Socioeconomic History   Marital status: Married    Spouse name: [name]   Number of children: 2   Years of education: Not on file   Highest education level: Not on file  Occupational History   Not on file  Tobacco Use   Smoking status: Never   Smokeless tobacco: Former    Types: Snuff, Chew  Vaping Use   Vaping Use: Never used  Substance and Sexual Activity   Alcohol use: Never   Drug use: Never   Sexual activity: Yes  Other Topics Concern   Not on file  Social History Narrative   [place] in [place]; with wife; 2 daughters[12 and 22]; never smoked; rare alcohol. 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   Social Determinants of Health   Financial Resource Strain: Low Risk  (05/10/2022)   Overall  Financial Resource Strain (CARDIA)    Difficulty of Paying Living Expenses: Not hard at all  Food Insecurity: No Food Insecurity (05/10/2022)   Hunger Vital Sign    Worried About Running Out of Food in the Last Year: Never true    Ran Out of Food in the Last Year: Never true  Transportation Needs: No Transportation Needs (03/16/2022)   PRAPARE - Hydrologist (Medical): No    Lack of Transportation (Non-Medical): No  Physical Activity: Not on file  Stress: Not on file  Social Connections: Socially Integrated (05/10/2022)   Social Connection and Isolation Panel [NHANES]    Frequency of Communication with Friends and Family: More than three times a week    Frequency of Social Gatherings with Friends and Family: More than three times a week    Attends Religious Services: More than 4 times per year    Active Member of [employer] or Organizations: Yes    Attends Music therapist: More than 4 times per year    Marital Status: Married  Human resources officer Violence: Not on file    FAMILY HISTORY: Family History  Problem Relation Age of Onset   High Cholesterol Mother    Hypertension Father    Cancer Father    Lung cancer Father    Skin cancer Father     ALLERGIES:  is allergic to codeine.  MEDICATIONS:  Current Outpatient Medications  Medication Sig Dispense Refill   apixaban (ELIQUIS) 5 MG TABS tablet  Take 1 tablet (5 mg total) by mouth 2 (two) times daily. 60 tablet 2   folic acid (FOLVITE) 1 MG tablet Take 1 tablet (1 mg total) by mouth daily. 90 tablet 1   lidocaine-prilocaine (EMLA) cream Apply 1 application topically as needed. 30 g 0   loratadine (CLARITIN) 10 MG tablet Take 10 mg by mouth daily. For 5 days after the treatment.     metoprolol succinate (TOPROL-XL) 50 MG 24 hr tablet Take 50 mg by mouth daily. Take with or immediately following a meal.     omeprazole (PRILOSEC) 20 MG capsule Take 1 capsule (20 mg total) by mouth daily. 30 capsule 2    ondansetron (ZOFRAN) 8 MG tablet One pill every 8 hours as needed for nausea/vomitting. 40 tablet 1   oxyCODONE (OXY IR/ROXICODONE) 5 MG immediate release tablet Take 1 tablet (5 mg total) by mouth every 4 (four) hours as needed for severe pain. 30 tablet 0   potassium chloride SA (KLOR-CON M) 20 MEQ tablet Take 1 tablet (20 mEq total) by mouth daily. 14 tablet 0   prochlorperazine (COMPAZINE) 10 MG tablet Take 1 tablet (10 mg total) by mouth every 6 (six) hours as needed for nausea or vomiting. 40 tablet 1   sertraline (ZOLOFT) 50 MG tablet TAKE 1/2 TABLET BY MOUTH DAILY 30 tablet 2   calcium carbonate (TUMS EX) 750 MG chewable tablet Chew 1 tablet by mouth daily. (Patient not taking: Reported on 08/27/2022)     dronabinol (MARINOL) 2.5 MG capsule Take 1 capsule (2.5 mg total) by mouth 2 (two) times daily before lunch and supper. (Patient not taking: Reported on 08/27/2022) 60 capsule 0   No current facility-administered medications for this visit.   Facility-Administered Medications Ordered in Other Visits  Medication Dose Route Frequency Provider Last Rate Last Admin   heparin lock flush 100 UNIT/ML injection            heparin lock flush 100 UNIT/ML injection            heparin lock flush 100 unit/mL  500 Units Intravenous Once Charlaine Dalton R, MD       sodium chloride flush (NS) 0.9 % injection 10 mL  10 mL Intravenous PRN [name], MD   10 mL at 07/28/21 0852    PHYSICAL EXAMINATION: ECOG PERFORMANCE STATUS: 1 - Symptomatic but completely ambulatory  Vitals:   09/18/22 1112  BP: 122/85  Pulse: 88  Temp: 98 F (36.7 C)  SpO2: 95%   Filed Weights   09/18/22 1112  Weight: 220 lb (99.8 kg)   Physical Exam HENT:     Head: Normocephalic and atraumatic.     Mouth/Throat:     Pharynx: No oropharyngeal exudate.  Eyes:     Pupils: Pupils are equal, round, and reactive to light.  Cardiovascular:     Rate and Rhythm: Normal rate and regular rhythm.   Pulmonary:     Effort: No respiratory distress.     Breath sounds: No wheezing.     Comments: Decreased breath sound on the left side compared to right. Abdominal:     General: Bowel sounds are normal. There is no distension.     Palpations: Abdomen is soft. There is no mass.     Tenderness: There is no abdominal tenderness. There is no guarding or rebound.  Musculoskeletal:        General: No tenderness. Normal range of motion.     Cervical back: Normal range of motion  and neck supple.  Skin:    General: Skin is warm.  Neurological:     Mental Status: He is alert and oriented to person, place, and time.  Psychiatric:        Mood and Affect: Affect normal.    LABORATORY DATA:  I have reviewed the data as listed Lab Results  Component Value Date   WBC 7.5 09/11/2022   HGB 13.2 09/11/2022   HCT 40.1 09/11/2022   MCV 87.7 09/11/2022   PLT 300 09/11/2022   Recent Labs    08/27/22 0837 09/04/22 0900 09/11/22 0818  NA 134* 134* 134*  K 3.2* 3.3* 4.2  CL 99 105 106  CO2 _0 GLUCOSE 100* 118* 90  BUN _1 CREATININE 0.97 0.93 0.80  CALCIUM 8.0* 7.9* 8.2*  GFRNONAA >60 >60 >60  PROT 6.2* 6.2* 6.9  ALBUMIN 2.9* 2.8* 3.0*  AST 87* 87* 103*  ALT 77* 79* 100*  ALKPHOS 351* 357* 336*  BILITOT 0.5 0.5 0.6     RADIOGRAPHIC STUDIES: I have personally reviewed the radiological images as listed and agreed with the findings in the report. MR LIVER W WO CONTRAST  Result Date: 09/06/2022 CLINICAL DATA:  Metastatic non-small cell lung cancer follow-up liver lesions seen on recent PET-CT. EXAM: MRI ABDOMEN WITHOUT AND WITH CONTRAST TECHNIQUE: Multiplanar multisequence MR imaging of the abdomen was performed both before and after the administration of intravenous contrast. CONTRAST:  77m GADAVIST GADOBUTROL 1 MMOL/ML IV SOLN COMPARISON:  PET-CT 08/15/2022 FINDINGS: Lower chest: Large complex left pleural fluid collection. No obvious right-sided pulmonary lesions. No  pericardial effusion. Hepatobiliary: Diffuse hepatic metastatic disease with numerous lesions throughout both lobes of the liver. The large is lesion in the left lobe measures 4.5 x 3.7 cm. The largest lesion in the right lobe and segment 6 measures 3.7 x 2.3 cm. 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 The gallbladder is contracted and surrounded by fluid. No common bile duct dilatation. Pancreas:  No mass, inflammation or ductal dilatation. Spleen:  Normal size. No focal lesions. The splenic vein is patent. Adrenals/Urinary Tract:  Adrenal glands and kidneys are. Stomach/Bowel: The stomach, duodenum, visualized small visualized colon are grossly. Vascular/Lymphatic: The aorta and branch vessels are normal. The major venous structures are patent except for the portal vein as detailed above. Periportal adenopathy as demonstrated on the PET scan. Other:  Small volume ascites. Musculoskeletal: No significant bony findings. IMPRESSION: 1. Diffuse hepatic metastatic disease with numerous lesions throughout both lobes of the liver. 2. 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 3. Periportal adenopathy as demonstrated on the PET scan. 4. Large complex left pleural fluid collection. 5. Small volume ascites. Electronically Signed   By: PMarijo SanesM.D.   On: 09/06/2022 11:48   UKoreaAbdomen Limited  Result Date: 08/31/2022 CLINICAL DATA:  Liver mass on PET EXAM: ULTRASOUND ABDOMEN LIMITED RIGHT UPPER QUADRANT COMPARISON:  PET-CT August 15, 2022 FINDINGS: Limited ultrasound of the liver was performed for biopsy planning purposes. Sonographic images of the liver do not demonstrate a focal liver lesion amenable for percutaneous biopsy. An ultrasound correlate to the findings on recent PET-CT was not identified. No biopsy performed. IMPRESSION: No focal liver  lesion identified by ultrasound correlating to recent findings on PET-CT. Further evaluation of these liver lesions with contrast-enhanced MRI is recommended. This  was communicated to the referring Oncology team. Electronically Signed   By: Albin Felling M.D.   On: 08/31/2022 13:57    ASSESSMENT & PLAN:   No problem-specific Assessment & Plan notes found for this encounter.  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 Progressed on osimertinib 80 mg a day- 08/15/22 PET showed progression in liver, new fdg avid nodule w/I central left midlung, new small volume ascites within right iliac fossa and central pelvis with mild adjacent thickening of the peritoneal reflections. Possible lymphangitic spread. Awaiting liver biopsy. Awaiting repeat NGS. Currently s/p cycle 2 of carbo-taxol-atezo-bev chemotherapy with neulasta support.   Anticoagulation recommendations for upcoming liver biopsy- last dose of Eliquis on 12/11. Starting 12/12. Take Lovenox BID SQ. DO NOT take on the evening of 12/14. Re-START Eliquis evening of Liver Biopsy. Provided patient with teaching for administration. Can also return to clinic for first dose if desired.   Brain metastases- asymptomatic -JUNE 29th, 2023- Numerous metastatic lesions in the supratentorial brain-increase in number also conspicuity compared to January 2023.  Pre-SBRT MRI brain AUG 18th, 2023-  Eighteen small enhancing brain metastases (punctate to 11 mm); were all present on 03/28/2021-s/p SBRT SEP 2023- [[place]].  August 15, 2022 brain MRI-overall stable.  Discussed with radiation oncology at [facility].  Repeat brain MRI in February.  Elevated LFTs: AST ALT 100 range; elevated alkaline phosphatase normal bilirubin. Likely secondary progressive disease in the liver. NOV 2023- MRI liver. Diffuse hepatic metastatic disease with numerous lesions throughout both lobes of the liver-await CT-guided liver biopsy.   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 Continue eliquis.    Bone metastases- CT Dec 7th, 2022 to healing process. Dental evaluation on 9/21.  Continue calcium vitamin D intake. Calcium 8.5- HOLD for now.    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   # IV access/- port functioning appropriately   DISPOSITION:  Follow up as scheduled- la  All questions were answered. The patient knows to call the clinic with any problems, questions or concerns.    Verlon Au, NP 09/18/2022

## 2022-09-18 NOTE — Patient Instructions (Signed)
Here are some instructions for how to administer the lovenox.   [email or web address]  Don't hesitate to call or come by the [facility] if you'd like help administering your medication.   Take your last dose of eliquis on 12/11. Starting on 12/12, administer the lovenox injection twice a day. Your last dose of lovenox will be on the morning of 12/14. Do not take lovenox on the evening of 12/14. You will undergo liver biopsy on 12/15. Please restart your eliquis on the evening of 12/15.

## 2022-09-20 DIAGNOSIS — C3490 Malignant neoplasm of unspecified part of unspecified bronchus or lung: Secondary | ICD-10-CM | POA: Insufficient documentation

## 2022-09-21 ENCOUNTER — Encounter: Payer: Self-pay | Admitting: Nurse Practitioner

## 2022-09-21 ENCOUNTER — Ambulatory Visit: Payer: Managed Care, Other (non HMO)

## 2022-09-21 ENCOUNTER — Encounter: Payer: Self-pay | Admitting: Internal Medicine

## 2022-09-25 ENCOUNTER — Other Ambulatory Visit: Payer: Self-pay

## 2022-09-25 ENCOUNTER — Inpatient Hospital Stay: Payer: Managed Care, Other (non HMO)

## 2022-09-25 ENCOUNTER — Inpatient Hospital Stay (HOSPITAL_BASED_OUTPATIENT_CLINIC_OR_DEPARTMENT_OTHER): Payer: Managed Care, Other (non HMO) | Admitting: Nurse Practitioner

## 2022-09-25 VITALS — BP 127/90 | HR 88 | Temp 97.8°F | Ht 70.0 in

## 2022-09-25 DIAGNOSIS — C3432 Malignant neoplasm of lower lobe, left bronchus or lung: Secondary | ICD-10-CM | POA: Diagnosis not present

## 2022-09-25 DIAGNOSIS — Z2911 Encounter for prophylactic immunotherapy for respiratory syncytial virus (RSV): Secondary | ICD-10-CM | POA: Diagnosis not present

## 2022-09-25 DIAGNOSIS — Z7901 Long term (current) use of anticoagulants: Secondary | ICD-10-CM

## 2022-09-25 DIAGNOSIS — Z5181 Encounter for therapeutic drug level monitoring: Secondary | ICD-10-CM

## 2022-09-25 DIAGNOSIS — Z5112 Encounter for antineoplastic immunotherapy: Secondary | ICD-10-CM | POA: Diagnosis not present

## 2022-09-25 LAB — CBC WITH DIFFERENTIAL/PLATELET
Abs Immature Granulocytes: 1.27 10*3/uL — ABNORMAL HIGH (ref 0.00–0.07)
Basophils Absolute: 0.1 10*3/uL (ref 0.0–0.1)
Basophils Relative: 1 %
Eosinophils Absolute: 0 10*3/uL (ref 0.0–0.5)
Eosinophils Relative: 0 %
HCT: 39 % (ref 39.0–52.0)
Hemoglobin: 12.7 g/dL — ABNORMAL LOW (ref 13.0–17.0)
Immature Granulocytes: 8 %
Lymphocytes Relative: 8 %
Lymphs Abs: 1.2 10*3/uL (ref 0.7–4.0)
MCH: 28.7 pg (ref 26.0–34.0)
MCHC: 32.6 g/dL (ref 30.0–36.0)
MCV: 88.2 fL (ref 80.0–100.0)
Monocytes Absolute: 0.8 10*3/uL (ref 0.1–1.0)
Monocytes Relative: 5 %
Neutro Abs: 11.7 10*3/uL — ABNORMAL HIGH (ref 1.7–7.7)
Neutrophils Relative %: 78 %
Platelets: 119 10*3/uL — ABNORMAL LOW (ref 150–400)
RBC: 4.42 MIL/uL (ref 4.22–5.81)
RDW: 18.2 % — ABNORMAL HIGH (ref 11.5–15.5)
Smear Review: NORMAL
WBC: 15.1 10*3/uL — ABNORMAL HIGH (ref 4.0–10.5)
nRBC: 0.2 % (ref 0.0–0.2)

## 2022-09-25 LAB — COMPREHENSIVE METABOLIC PANEL
ALT: 77 U/L — ABNORMAL HIGH (ref 0–44)
AST: 67 U/L — ABNORMAL HIGH (ref 15–41)
Albumin: 3.2 g/dL — ABNORMAL LOW (ref 3.5–5.0)
Alkaline Phosphatase: 261 U/L — ABNORMAL HIGH (ref 38–126)
Anion gap: 7 (ref 5–15)
BUN: 7 mg/dL (ref 6–20)
CO2: 26 mmol/L (ref 22–32)
Calcium: 8.1 mg/dL — ABNORMAL LOW (ref 8.9–10.3)
Chloride: 107 mmol/L (ref 98–111)
Creatinine, Ser: 0.89 mg/dL (ref 0.61–1.24)
GFR, Estimated: >60 mL/min (ref >60)
Glucose, Bld: 148 mg/dL — ABNORMAL HIGH (ref 70–99)
Potassium: 3.4 mmol/L — ABNORMAL LOW (ref 3.5–5.1)
Sodium: 140 mmol/L (ref 135–145)
Total Bilirubin: 0.4 mg/dL (ref 0.3–1.2)
Total Protein: 6.7 g/dL (ref 6.5–8.1)

## 2022-09-25 MED ORDER — HEPARIN SOD (PORK) LOCK FLUSH 100 UNIT/ML IV SOLN
500.0000 [IU] | Freq: Once | INTRAVENOUS | Status: AC
  Administered 2022-09-25: 500 [IU] via INTRAVENOUS
  Filled 2022-09-25: qty 5

## 2022-09-25 MED ORDER — AREXVY 120 MCG/0.5ML IM SUSR
0.5000 mL | Freq: Once | INTRAMUSCULAR | 0 refills | Status: AC
  Filled 2022-09-25: qty 1, 1d supply, fill #0

## 2022-09-25 MED ORDER — AREXVY 120 MCG/0.5ML IM SUSR
INTRAMUSCULAR | 0 refills | Status: DC
  Filled 2022-09-25: qty 1, 1d supply, fill #0

## 2022-09-25 MED ORDER — SODIUM CHLORIDE 0.9% FLUSH
10.0000 mL | Freq: Once | INTRAVENOUS | Status: AC
  Administered 2022-09-25: 10 mL via INTRAVENOUS
  Filled 2022-09-25: qty 10

## 2022-09-25 MED ORDER — FLUARIX QUADRIVALENT 0.5 ML IM SUSY
PREFILLED_SYRINGE | INTRAMUSCULAR | 0 refills | Status: DC
  Filled 2022-09-25: qty 0.5, 1d supply, fill #0

## 2022-09-25 MED ORDER — COMIRNATY 30 MCG/0.3ML IM SUSY
PREFILLED_SYRINGE | INTRAMUSCULAR | 0 refills | Status: DC
  Filled 2022-09-25: qty 0.3, 1d supply, fill #0

## 2022-09-25 NOTE — Progress Notes (Signed)
H/o nosebleeds-mild. Patient has not had to pack the nosebleed. Last episode of epistaxis last Thursday or Fri.

## 2022-09-25 NOTE — Progress Notes (Signed)
**[name] De-Identified via Obfuscation** Nathan [name]  Patient Care Team: Pcp, No as PCP - General Nathan Nab, RN as Oncology Nurse Navigator Nathan Sickle, MD as Consulting Physician (Internal Medicine)  CHIEF COMPLAINTS/PURPOSE OF CONSULTATION: Lung cancer  #  Oncology History Overview [name]  IMPRESSION: 1. Patchy nodular fat stranding throughout the anterior left upper quadrant peritoneal fat, nonspecific, cannot exclude peritoneal carcinomatosis. Dedicated CT abdomen/pelvis with oral and IV contrast recommended for further evaluation. 2. Dense patchy consolidation replacing much of the left lower lung lobe, appearing masslike in the superior segment left lower lobe, with associated bulging of the left major fissure and associated left lower lobe volume loss. Fine nodularity throughout both lungs with an upper lobe predominance. Asymmetric left upper lobe interlobular septal thickening. These findings are indeterminate, with differential including multilobar pneumonia, sarcoidosis or a neoplastic process. The persistence on radiographs back to 01/20/2021 despite antibiotic therapy make sarcoidosis or a neoplastic process more likely. Pulmonology consultation suggested for consideration of bronchoscopic evaluation. 3. Small dependent left pleural effusion. 4. Mild mediastinal lymphadenopathy, nonspecific. 5. Subacute healing lateral right sixth rib fracture.   DIAGNOSIS:  A. LUNG, LEFT LOWER LOBE; ENB-ASSISTED BIOPSY:  - NON-SMALL CELL CARCINOMA, FAVOR ADENOCARCINOMA.  - FOREIGN MATERIAL SUGGESTIVE OF ASPIRATION.   # EGFR MUTATED: 83 del- June 28th, 2022- Nathan [name]. [limited]  # Asymptomatic multiple brain mets subcentimeter asymptomatic -JUNE 29th, 2023- Numerous metastatic lesions in the supratentorial brain-increase in number also conspicuity compared to January 2023.  Pre-SBRT MRI brain AUG 18th, 2023-  Eighteen small enhancing brain metastases (punctate to 11 mm); were all  present on 03/28/2021-s/p SBRT SEP 2023- [[place]]  # SEP-OCT-worsening left-sided pleural effusion-status post paracentesis; exudative; Cytology negative- ?  Progressive disease..   Cancer of lower lobe of left lung ([place])  03/23/2021 Initial Diagnosis   Cancer of lower lobe of left lung ([place])   03/29/2021 Cancer Staging   Staging form: Lung, AJCC 8th Edition - Clinical: Stage IVB (cT3, cN3, pM1c) - Signed by Nathan Sickle, MD on 03/29/2021   03/30/2021 - 03/30/2021 Chemotherapy   Patient is on Treatment Plan : LUNG NSCLC Pemetrexed ([place]) / Carboplatin q21d x 1 cycles     08/17/2022 -  Chemotherapy   Patient is on Treatment Plan : LUNG Atezolizumab + Bevacizumab + Carboplatin + Paclitaxel q21d Induction x 4 cycles / Atezolizumab + Bevacizumab q21d Maintenance      HISTORY OF PRESENTING ILLNESS: Ambulating independently.  With wife.   Nathan [name] 41 y.o.  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   Aching in feet resolved after one dose of gabapentin. He saw Nathan [name] at [facility]. No clinical trials were available. He questions getting covid, flu, and rsv vaccines. He feels well today and denies complaints. Feels like his symptoms are improving with chemotherapy.    Review of Systems  Constitutional:  Negative for chills, diaphoresis, fever, malaise/fatigue and weight loss.  HENT:  Negative for nosebleeds and sore throat.   Eyes:  Negative for double vision.  Respiratory:  Negative for cough, sputum production, shortness of breath and wheezing.   Cardiovascular:  Negative for chest pain, palpitations, orthopnea and leg swelling.  Gastrointestinal:  Negative for abdominal pain, blood in stool, constipation, diarrhea, heartburn, melena and nausea.  Genitourinary:  Negative for dysuria,  frequency and urgency.  Musculoskeletal:  Negative for back  pain and joint pain.  Skin:  Negative for itching.  Neurological:  Negative for dizziness, tingling, focal weakness, weakness and headaches.  Endo/Heme/Allergies:  Does not bruise/bleed easily.  Psychiatric/Behavioral:  Negative for depression. The patient is not nervous/anxious and does not have insomnia.      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    SURGICAL HISTORY: Past Surgical History:  Procedure Laterality Date   FRACTURE SURGERY     broke femur when he was 6 years   PORTA CATH INSERTION N/A 03/28/2021   Procedure: PORTA CATH INSERTION;  Surgeon: Katha Cabal, MD;  Location: [facility];  Service: Cardiovascular;  Laterality: N/A;   VIDEO BRONCHOSCOPY WITH ENDOBRONCHIAL NAVIGATION N/A 03/15/2021   Procedure: VIDEO BRONCHOSCOPY WITH ENDOBRONCHIAL NAVIGATION;  Surgeon: Ottie Glazier, MD;  Location: [facility];  Service: Thoracic;  Laterality: N/A;   VIDEO BRONCHOSCOPY WITH ENDOBRONCHIAL ULTRASOUND N/A 03/15/2021   Procedure: VIDEO BRONCHOSCOPY WITH ENDOBRONCHIAL ULTRASOUND;  Surgeon: Ottie Glazier, MD;  Location: [facility];  Service: Thoracic;  Laterality: N/A;    SOCIAL HISTORY: Social History   Socioeconomic History   Marital status: Married    Spouse name: Nathan [name]   Number of children: 2   Years of education: Not on file   Highest education level: Not on file  Occupational History   Not on file  Tobacco Use   Smoking status: Never   Smokeless tobacco: Former    Types: Snuff, Chew  Vaping Use   Vaping Use: Never used  Substance and Sexual Activity   Alcohol use: Never   Drug use: Never   Sexual activity: Yes  Other Topics Concern   Not on file  Social History Narrative   [place] in [place]; with wife; 2 daughters[12 and 22]; never smoked; rare alcohol. 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   Social Determinants of Health   Financial Resource Strain: Low Risk  (05/10/2022)   Overall Financial Resource Strain (CARDIA)    Difficulty of Paying Living Expenses: Not hard at all  Food Insecurity: No Food Insecurity (05/10/2022)   Hunger Vital Sign    Worried About Running Out of Food in the Last Year: Never true    Ran Out of Food in the Last Year: Never true  Transportation Needs: No Transportation Needs (03/16/2022)   PRAPARE - Hydrologist (Medical): No    Lack of Transportation (Non-Medical): No  Physical Activity: Not on file  Stress: Not on file  Social Connections: Socially Integrated (05/10/2022)   Social Connection and Isolation Panel [NHANES]    Frequency of Communication with Friends and Family: More than three times a week    Frequency of Social Gatherings with Friends and Family: More than three times a week    Attends Religious Services: More than 4 times per year    Active Member of [employer] or Organizations: Yes    Attends Music therapist: More than 4 times per year    Marital Status: Married  Human resources officer Violence: Not on file    FAMILY HISTORY: Family History  Problem Relation Age of Onset   High Cholesterol Mother    Hypertension Father    Cancer Father    Lung cancer Father    Skin cancer Father     ALLERGIES:  is allergic to codeine.  MEDICATIONS:  Current Outpatient Medications  Medication Sig Dispense Refill   folic acid (FOLVITE) 1 MG tablet Take 1 tablet (1 mg total) by mouth daily. 90 tablet 1   gabapentin (NEURONTIN) 100 MG capsule Take 1 capsule (100 mg total) by mouth at bedtime. 30 capsule 1   lidocaine-prilocaine (EMLA) cream Apply 1 application topically as needed. 30 g 0   loratadine (CLARITIN) 10 MG tablet Take 10 mg by mouth daily. For 5 days after the treatment.     metoprolol succinate (TOPROL-XL) 50 MG 24 hr tablet Take 50 mg by mouth daily. Take with or  immediately following a meal.     omeprazole (PRILOSEC) 20 MG capsule Take 1 capsule (20 mg total) by mouth daily. 30 capsule 2   ondansetron (ZOFRAN) 8 MG tablet One pill every 8 hours as needed for nausea/vomitting. 40 tablet 1   oxyCODONE (OXY IR/ROXICODONE) 5 MG immediate release tablet Take 1 tablet (5 mg total) by mouth every 4 (four) hours as needed for severe pain. 30 tablet 0   potassium chloride SA (KLOR-CON M) 20 MEQ tablet Take 1 tablet (20 mEq total) by mouth daily. 14 tablet 0   prochlorperazine (COMPAZINE) 10 MG tablet Take 1 tablet (10 mg total) by mouth every 6 (six) hours as needed for nausea or vomiting. 40 tablet 1   sertraline (ZOLOFT) 50 MG tablet TAKE 1/2 TABLET BY MOUTH DAILY 30 tablet 2   traMADol (ULTRAM) 50 MG tablet Take 50 mg by mouth every 6 (six) hours as needed for moderate pain or severe pain.     apixaban (ELIQUIS) 5 MG TABS tablet Take 1 tablet (5 mg total) by mouth 2 (two) times daily. (Patient not taking: Reported on 09/25/2022) 60 tablet 2   calcium carbonate (TUMS EX) 750 MG chewable tablet Chew 1 tablet by mouth daily. (Patient not taking: Reported on 08/27/2022)     dronabinol (MARINOL) 2.5 MG capsule Take 1 capsule (2.5 mg total) by mouth 2 (two) times daily before lunch and supper. (Patient not taking: Reported on 08/27/2022) 60 capsule 0   enoxaparin (LOVENOX) 100 MG/ML injection Take last dose of eliquis on 12/11. Starting on 12/12, inject 1 ml (100 mg total) into skin of abdomen every 12 hours. Do not take on evening of 12/14. Restart eliquis evening of biopsy. (Patient not taking: Reported on 09/25/2022) 5 mL 0   No current facility-administered medications for this visit.   Facility-Administered Medications Ordered in Other Visits  Medication Dose Route Frequency Provider Last Rate Last Admin   heparin lock flush 100 UNIT/ML injection            heparin lock flush 100 UNIT/ML injection            heparin lock flush 100 unit/mL  500 Units Intravenous  Once Charlaine Dalton R, MD       sodium chloride flush (NS) 0.9 % injection 10 mL  10 mL Intravenous PRN Charlaine Dalton R, MD   10 mL at 07/28/21 0852   sodium chloride flush (NS) 0.9 % injection 10 mL  10 mL Intravenous Once Nathan Sickle, MD        PHYSICAL EXAMINATION: ECOG PERFORMANCE STATUS: 1 - Symptomatic but completely ambulatory  Vitals:   09/25/22 1030 09/25/22 1032  BP: (!) 124/93 (!) 127/90  Pulse: 94 88  Temp: 97.8 F (36.6 C)   SpO2: 98%    There were no vitals filed for this visit.  Physical  Exam Constitutional:      Appearance: He is not ill-appearing.     Comments: Accompanied by father, Nathan [name]  HENT:     Head: Normocephalic and atraumatic.  Cardiovascular:     Rate and Rhythm: Normal rate and regular rhythm.  Pulmonary:     Effort: No respiratory distress.     Breath sounds: No wheezing.     Comments: Decreased breath sound on the left side compared to right. Abdominal:     General: There is no distension.     Palpations: Abdomen is soft.     Tenderness: There is no abdominal tenderness.  Musculoskeletal:        General: No deformity.  Skin:    General: Skin is warm.     Coloration: Skin is not pale.  Neurological:     Mental Status: He is alert and oriented to person, place, and time.  Psychiatric:        Mood and Affect: Mood and affect normal.        Behavior: Behavior normal.    LABORATORY DATA:  I have reviewed the data as listed Lab Results  Component Value Date   WBC 15.1 (H) 09/25/2022   HGB 12.7 (L) 09/25/2022   HCT 39.0 09/25/2022   MCV 88.2 09/25/2022   PLT 119 (L) 09/25/2022   Recent Labs    09/11/22 0818 09/18/22 1105 09/25/22 1015  NA 134* 137 140  K 4.2 3.8 3.4*  CL 106 102 107  CO2 _0 GLUCOSE 90 129* 148*  BUN _1 CREATININE 0.80 0.91 0.89  CALCIUM 8.2* 8.5* 8.1*  GFRNONAA >60 >60 >60  PROT 6.9 6.9 6.7  ALBUMIN 3.0* 3.2* 3.2*  AST 103* 76* 67*  ALT 100* 97* 77*  ALKPHOS 336* 260* 261*   BILITOT 0.6 0.5 0.4    RADIOGRAPHIC STUDIES: I have personally reviewed the radiological images as listed and agreed with the findings in the report. MR LIVER W WO CONTRAST  Result Date: 09/06/2022 CLINICAL DATA:  Metastatic non-small cell lung cancer follow-up liver lesions seen on recent PET-CT. EXAM: MRI ABDOMEN WITHOUT AND WITH CONTRAST TECHNIQUE: Multiplanar multisequence MR imaging of the abdomen was performed both before and after the administration of intravenous contrast. CONTRAST:  62m GADAVIST GADOBUTROL 1 MMOL/ML IV SOLN COMPARISON:  PET-CT 08/15/2022 FINDINGS: Lower chest: Large complex left pleural fluid collection. No obvious right-sided pulmonary lesions. No pericardial effusion. Hepatobiliary: Diffuse hepatic metastatic disease with numerous lesions throughout both lobes of the liver. The large is lesion in the left lobe measures 4.5 x 3.7 cm. The largest lesion in the right lobe and segment 6 measures 3.7 x 2.3 cm. 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 The gallbladder is contracted and surrounded by fluid. No common bile duct dilatation. Pancreas:  No mass, inflammation or ductal dilatation. Spleen:  Normal size. No focal lesions. The splenic vein is patent. Adrenals/Urinary Tract:  Adrenal glands and kidneys are. Stomach/Bowel: The stomach, duodenum, visualized small visualized colon are grossly. Vascular/Lymphatic: The aorta and branch vessels are normal. The major venous structures are patent except for the portal vein as detailed above. Periportal adenopathy as demonstrated on the PET scan. Other:  Small volume ascites. Musculoskeletal: No significant bony findings. IMPRESSION: 1. Diffuse hepatic metastatic disease with numerous lesions throughout both lobes of the liver. 2. 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 3.  Periportal adenopathy as demonstrated on the PET scan. 4. Large complex left pleural fluid collection. 5. Small volume ascites. Electronically Signed   By: Marijo Sanes M.D.   On: 09/06/2022 11:48   US Abdomen Limited  Result Date: 08/31/2022 CLINICAL DATA:  Liver mass on PET EXAM: ULTRASOUND ABDOMEN LIMITED RIGHT UPPER QUADRANT COMPARISON:  PET-CT August 15, 2022 FINDINGS: Limited ultrasound of the liver was performed for biopsy planning purposes. Sonographic images of the liver do not demonstrate a focal liver lesion amenable for percutaneous biopsy. An ultrasound correlate to the findings on recent PET-CT was not identified. No biopsy performed. IMPRESSION: No focal liver lesion identified by ultrasound correlating to recent findings on PET-CT. Further evaluation of these liver lesions with contrast-enhanced MRI is recommended. This was communicated to the referring Oncology team. Electronically Signed   By: Albin Felling M.D.   On: 08/31/2022 13:57    ASSESSMENT & PLAN:   No problem-specific Assessment & Plan notes found for this encounter.  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 Progressed on osimertinib 80 mg a day- 08/15/22 PET showed progression in liver, new fdg avid nodule w/I central left midlung, new small volume ascites within right iliac fossa and central pelvis with mild adjacent thickening of the peritoneal reflections. Possible lymphangitic spread. Awaiting liver biopsy with repeat NGS. Currently s/p cycle 2 of carbo-taxol-atezo-bev chemotherapy with neulasta support. He receives neulasta at home on D2. He saw Nathan. Aniceto [name] for second opinion at [facility] on 09/20/22. No changes currently. Per patient, currently no trials available. Awaiting, repeat liver biopsy.   Again reviewed recommendations for anticoagulation for upcoming liver biopsy. He has not started lovenox but will take first dose when he leaves today. Plan for 2 doses today and  tomorrow. 1 dose on Thursday. No lovenox on evening of 12/14. He will undergo liver biopsy on Friday then restart eliquis on Friday evening. Reviewed administration instructions.   Brain metastases- asymptomatic -JUNE 29th, 2023- Numerous metastatic lesions in the supratentorial brain-increase in number also conspicuity compared to January 2023.  Pre-SBRT MRI brain AUG 18th, 2023-  Eighteen small enhancing brain metastases (punctate to 11 mm); were all present on 03/28/2021-s/p SBRT SEP 2023- [[place]].  August 15, 2022 brain MRI-overall stable.  Discussed with radiation oncology at [facility].  Repeat brain MRI in February.  Elevated LFTs: LFTs are improving. Monitor.   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 Continue eliquis.    Bone metastases- CT Dec 7th, 2022 to healing process. Dental evaluation on 9/21.  Continue calcium & vitamin D intake. Calcium 8.1- HOLD for now.    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   # IV access/- port functioning appropriately  # Need for immunization. Patient is scheduled for covid, flu, and rsv vaccinations today.    DISPOSITION:  Follow up as scheduled- la  All questions were answered. The patient knows to call the clinic with any problems, questions or concerns.   Nathan Au, NP 09/25/2022

## 2022-09-25 NOTE — Progress Notes (Signed)
See infusion encounter for documentation for d/c port needle.

## 2022-09-27 ENCOUNTER — Other Ambulatory Visit: Payer: Self-pay | Admitting: Student

## 2022-09-27 DIAGNOSIS — Z9189 Other specified personal risk factors, not elsewhere classified: Secondary | ICD-10-CM

## 2022-09-27 NOTE — Progress Notes (Signed)
Patient for CT guided Liver Biopsy on Friday 09/28/2022, I called and spoke with the patient on the phone and gave pre-procedure instructions. Pt was made aware to be here at 9:30a at the new entrance, Pt is on Lovenox [place] and took last dose on Thurs morning 09/27/2022 and Pt has not been taking his Eliquis for a while now.  NPO after MN prior to procedure as well as driver post procedure/recovery/discharge. Pt stated understanding. Called 09/27/2022

## 2022-09-28 ENCOUNTER — Ambulatory Visit
Admission: RE | Admit: 2022-09-28 | Discharge: 2022-09-28 | Disposition: A | Discharge status: Home / Self Care | Payer: Managed Care, Other (non HMO) | Source: Ambulatory Visit | Attending: Internal Medicine | Admitting: Internal Medicine

## 2022-09-28 DIAGNOSIS — K769 Liver disease, unspecified: Secondary | ICD-10-CM | POA: Insufficient documentation

## 2022-09-28 DIAGNOSIS — Z9189 Other specified personal risk factors, not elsewhere classified: Secondary | ICD-10-CM

## 2022-09-28 DIAGNOSIS — C3490 Malignant neoplasm of unspecified part of unspecified bronchus or lung: Secondary | ICD-10-CM | POA: Diagnosis present

## 2022-09-28 LAB — COMPREHENSIVE METABOLIC PANEL
ALT: 69 U/L — ABNORMAL HIGH (ref 0–44)
AST: 61 U/L — ABNORMAL HIGH (ref 15–41)
Albumin: 3.3 g/dL — ABNORMAL LOW (ref 3.5–5.0)
Alkaline Phosphatase: 234 U/L — ABNORMAL HIGH (ref 38–126)
Anion gap: 4 — ABNORMAL LOW (ref 5–15)
BUN: 11 mg/dL (ref 6–20)
CO2: 26 mmol/L (ref 22–32)
Calcium: 8.7 mg/dL — ABNORMAL LOW (ref 8.9–10.3)
Chloride: 107 mmol/L (ref 98–111)
Creatinine, Ser: 0.88 mg/dL (ref 0.61–1.24)
GFR, Estimated: >60 mL/min (ref >60)
Glucose, Bld: 91 mg/dL (ref 70–99)
Potassium: 4.1 mmol/L (ref 3.5–5.1)
Sodium: 137 mmol/L (ref 135–145)
Total Bilirubin: 0.6 mg/dL (ref 0.3–1.2)
Total Protein: 6.8 g/dL (ref 6.5–8.1)

## 2022-09-28 LAB — CBC
HCT: 39 % (ref 39.0–52.0)
Hemoglobin: 12.6 g/dL — ABNORMAL LOW (ref 13.0–17.0)
MCH: 29 pg (ref 26.0–34.0)
MCHC: 32.3 g/dL (ref 30.0–36.0)
MCV: 89.7 fL (ref 80.0–100.0)
Platelets: 155 10*3/uL (ref 150–400)
RBC: 4.35 MIL/uL (ref 4.22–5.81)
RDW: 18.5 % — ABNORMAL HIGH (ref 11.5–15.5)
WBC: 8.6 10*3/uL (ref 4.0–10.5)
nRBC: 0 % (ref 0.0–0.2)

## 2022-09-28 LAB — PROTIME-INR
INR: 1.1 (ref 0.8–1.2)
Prothrombin Time: 13.7 seconds (ref 11.4–15.2)

## 2022-09-28 MED ORDER — FENTANYL CITRATE (PF) 100 MCG/2ML IJ SOLN
INTRAMUSCULAR | Status: AC | PRN
  Administered 2022-09-28 (×2): 50 ug via INTRAVENOUS

## 2022-09-28 MED ORDER — IOHEXOL 300 MG/ML  SOLN
100.0000 mL | Freq: Once | INTRAMUSCULAR | Status: AC | PRN
  Administered 2022-09-28: 100 mL via INTRAVENOUS

## 2022-09-28 MED ORDER — HEPARIN SOD (PORK) LOCK FLUSH 100 UNIT/ML IV SOLN
INTRAVENOUS | Status: AC
  Administered 2022-09-28: 500 [IU]
  Filled 2022-09-28: qty 5

## 2022-09-28 MED ORDER — MIDAZOLAM HCL 5 MG/5ML IJ SOLN
INTRAMUSCULAR | Status: AC | PRN
  Administered 2022-09-28 (×2): 1 mg via INTRAVENOUS

## 2022-09-28 MED ORDER — SODIUM CHLORIDE 0.9 % IV SOLN
INTRAVENOUS | Status: DC
  Administered 2022-09-28: 1000 mL via INTRAVENOUS

## 2022-09-28 MED ORDER — MIDAZOLAM HCL 2 MG/2ML IJ SOLN
INTRAMUSCULAR | Status: AC
  Filled 2022-09-28: qty 2

## 2022-09-28 MED ORDER — LIDOCAINE HCL (PF) 1 % IJ SOLN
18.0000 mL | Freq: Once | INTRAMUSCULAR | Status: DC
  Filled 2022-09-28: qty 18

## 2022-09-28 MED ORDER — FENTANYL CITRATE (PF) 100 MCG/2ML IJ SOLN
INTRAMUSCULAR | Status: AC
  Filled 2022-09-28: qty 2

## 2022-09-28 NOTE — H&P (Signed)
Chief Complaint: Patient was seen in consultation today for liver lesion biopsy  Referring Physician(s): [name]  Supervising Physician: Juliet Rude  Patient Status: [facility] - Out-pt  History of Present Illness: [name] is a 41 y.o. male with PMH significant for Wolff-Parkinson-White syndrome and stage IV lung cancer. The patient had new liver lesions noted on a NM PET scan on 08/15/22 after experiencing increasing weakness. The patient was seen for an ultrasound guided biopsy of these liver lesions on 08/31/22, but the lesions were unable to be visualized via ultrasound at that time. The patient was subsequently rescheduled for a CT-guided biopsy to be performed today.   Past Medical History:  Diagnosis Date   Cancer ([place])    Dyspnea    Heartburn    Pneumonia    Wolff-Parkinson-White (WPW) 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    Past Surgical History:  Procedure Laterality Date   FRACTURE SURGERY     broke femur when he was 6 years   PORTA CATH INSERTION N/A 03/28/2021   Procedure: PORTA CATH INSERTION;  Surgeon: Katha Cabal, MD;  Location: [facility];  Service: Cardiovascular;  Laterality: N/A;   VIDEO BRONCHOSCOPY WITH ENDOBRONCHIAL NAVIGATION N/A 03/15/2021   Procedure: VIDEO BRONCHOSCOPY WITH ENDOBRONCHIAL NAVIGATION;  Surgeon: Ottie Glazier, MD;  Location: [facility];  Service: Thoracic;  Laterality: N/A;   VIDEO BRONCHOSCOPY WITH ENDOBRONCHIAL ULTRASOUND N/A 03/15/2021   Procedure: VIDEO BRONCHOSCOPY WITH ENDOBRONCHIAL ULTRASOUND;  Surgeon: Ottie Glazier, MD;  Location: [facility];  Service: Thoracic;  Laterality: N/A;    Allergies: Codeine  Medications: Prior to Admission medications   Medication Sig Start Date End Date Taking? Authorizing Provider  metoprolol succinate (TOPROL-XL) 50 MG 24 hr tablet Take 50 mg by mouth daily. Take with or immediately following a meal.   Yes [provider]  potassium chloride SA (KLOR-CON M)  20 MEQ tablet Take 1 tablet (20 mEq total) by mouth daily. 08/27/22  Yes Borders, Kirt Boys, NP  sertraline (ZOLOFT) 50 MG tablet TAKE 1/2 TABLET BY MOUTH DAILY 05/15/22  Yes [name], MD  apixaban (ELIQUIS) 5 MG TABS tablet Take 1 tablet (5 mg total) by mouth 2 (two) times daily. 05/15/22   [name], MD  calcium carbonate (TUMS EX) 750 MG chewable tablet Chew 1 tablet by mouth daily. Patient not taking: Reported on 08/27/2022    [provider]  COVID-19 mRNA vaccine [id number] (COMIRNATY) syringe Inject into the muscle. 09/25/22   Carlyle Basques, MD  dronabinol (MARINOL) 2.5 MG capsule Take 1 capsule (2.5 mg total) by mouth 2 (two) times daily before lunch and supper. Patient not taking: Reported on 08/27/2022 08/14/22   Borders, Kirt Boys, NP  enoxaparin (LOVENOX) 100 MG/ML injection Take last dose of eliquis on 12/11. Starting on 12/12, inject 1 ml (100 mg total) into skin of abdomen every 12 hours. Do not take on evening of 12/14. Restart eliquis evening of biopsy. Patient not taking: Reported on 09/25/2022 09/25/22   Verlon Au, NP  folic acid (FOLVITE) 1 MG tablet Take 1 tablet (1 mg total) by mouth daily. 04/05/22   [name], MD  gabapentin (NEURONTIN) 100 MG capsule Take 1 capsule (100 mg total) by mouth at bedtime. 09/18/22   Verlon Au, NP  influenza vac split quadrivalent PF (FLUARIX QUADRIVALENT) 0.5 ML injection Inject into the muscle. 09/25/22   Carlyle Basques, MD  lidocaine-prilocaine (EMLA) cream Apply 1 application topically as needed.  03/23/21   [name], MD  loratadine (CLARITIN) 10 MG tablet Take 10 mg by mouth daily. For 5 days after the treatment.    [provider]  omeprazole (PRILOSEC) 20 MG capsule Take 1 capsule (20 mg total) by mouth daily. 08/14/22   Borders, Kirt Boys, NP  ondansetron (ZOFRAN) 8 MG tablet One pill every 8 hours as needed for nausea/vomitting. 08/10/22   [name], MD   oxyCODONE (OXY IR/ROXICODONE) 5 MG immediate release tablet Take 1 tablet (5 mg total) by mouth every 4 (four) hours as needed for severe pain. 08/27/22   Borders, Kirt Boys, NP  prochlorperazine (COMPAZINE) 10 MG tablet Take 1 tablet (10 mg total) by mouth every 6 (six) hours as needed for nausea or vomiting. 08/10/22   [name], MD  traMADol (ULTRAM) 50 MG tablet Take 50 mg by mouth every 6 (six) hours as needed for moderate pain or severe pain.    [provider]     Family History  Problem Relation Age of Onset   High Cholesterol Mother    Hypertension Father    Cancer Father    Lung cancer Father    Skin cancer Father     Social History   Socioeconomic History   Marital status: Married    Spouse name: [name]   Number of children: 2   Years of education: Not on file   Highest education level: Not on file  Occupational History   Not on file  Tobacco Use   Smoking status: Never   Smokeless tobacco: Former    Types: Snuff, Chew  Vaping Use   Vaping Use: Never used  Substance and Sexual Activity   Alcohol use: Never   Drug use: Never   Sexual activity: Yes  Other Topics Concern   Not on file  Social History Narrative   [place]ives in [place]; with wife; 2 daughters[12 and 22]; never smoked; rare alcohol. 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   Social Determinants of Health   Financial Resource Strain: Low Risk  (05/10/2022)   Overall Financial Resource Strain (CARDIA)    Difficulty of Paying Living Expenses: Not hard at all  Food Insecurity: No Food Insecurity (05/10/2022)   Hunger Vital Sign    Worried About Running Out of Food in the Last Year: Never true    Ran Out of Food in the Last Year: Never true  Transportation Needs: No Transportation Needs (03/16/2022)   PRAPARE - Hydrologist (Medical): No    Lack of Transportation (Non-Medical): No  Physical Activity: Not on file  Stress: Not on file  Social Connections: Socially  Integrated (05/10/2022)   Social Connection and Isolation Panel [NHANES]    Frequency of Communication with Friends and Family: More than three times a week    Frequency of Social Gatherings with Friends and Family: More than three times a week    Attends Religious Services: More than 4 times per year    Active Member of [employer] or Organizations: Yes    Attends Music therapist: More than 4 times per year    Marital Status: Married     Review of Systems: A 12 point ROS discussed and pertinent positives are indicated in the HPI above.  All other systems are negative.  Review of Systems  Constitutional:  Positive for fatigue. Negative for chills and fever.  Respiratory:  Negative for chest tightness and shortness of breath.  Cardiovascular:  Negative for chest pain and leg swelling.  Gastrointestinal:  Negative for abdominal pain, diarrhea, nausea and vomiting.  Neurological:  Negative for dizziness and headaches.  Psychiatric/Behavioral:  Negative for confusion.     Vital Signs: BP 94/68   Pulse 65   Temp 97.8 F (36.6 C) (Oral)   Resp 16   Ht 5\' 10"  (1.778 m)   Wt 220 lb (99.8 kg)   SpO2 95%   BMI 31.57 kg/m     Physical Exam Vitals reviewed.  Constitutional:      General: He is not in acute distress.    Appearance: He is ill-appearing.  HENT:     Mouth/Throat:     Mouth: Mucous membranes are moist.  Cardiovascular:     Rate and Rhythm: Normal rate and regular rhythm.     Pulses: Normal pulses.     Heart sounds: Normal heart sounds.  Pulmonary:     Effort: Pulmonary effort is normal.     Breath sounds: Normal breath sounds.  Abdominal:     General: Bowel sounds are normal.     Palpations: Abdomen is soft.     Tenderness: There is no abdominal tenderness.  Neurological:     Mental Status: He is alert.     Imaging: CT LIVER MASS BIOPSY  Result Date: 09/28/2022 INDICATION: Concern for metastatic disease to the liver EXAM: CT-guided core needle  biopsy of liver lesion MEDICATIONS: None. ANESTHESIA/SEDATION: Moderate (conscious) sedation was employed during this procedure. A total of Versed 2 mg and Fentanyl 100 mcg was administered intravenously. Moderate Sedation Time: 26 minutes. The patient's level of consciousness and vital signs were monitored continuously by radiology nursing throughout the procedure under my direct supervision. FLUOROSCOPY TIME:  N/a COMPLICATIONS: None immediate. PROCEDURE: Informed written consent was obtained from the patient after a thorough discussion of the procedural risks, benefits and alternatives. All questions were addressed. Maximal Sterile Barrier Technique was utilized including caps, mask, sterile gowns, sterile gloves, sterile drape, hand hygiene and skin antiseptic. A timeout was performed prior to the initiation of the procedure. The patient was placed supine on the exam table. Limited CT of the abdomen and pelvis with IV contrast was performed for planning purposes. This demonstrated faintly hypoenhancing lesions in the liver, compatible with both recent MRI and PET-CT. The inferior anterior right hepatic lobe lesion was initially selected for biopsy. Skin entry site was marked, and the overlying skin was prepped and draped in the standard sterile fashion. Local analgesia was obtained with 1% lidocaine. Using intermittent CT fluoroscopy, a 17 gauge introducer needle was advanced towards the identified lesion. During needle positioning, the location of the liver was noted to change result of the patient's breathing, and this biopsy target was abandoned due to risk of injury to adjacent structures. Therefore, the biopsy target was changed to the inferior posterior right hepatic lesion. Again using CT guidance, a 17 gauge introducer needle was advanced towards the identified lesion using anatomic landmarks. Subsequently, core needle biopsy was performed using an 18 gauge core biopsy device x4 total passes. Specimens  were submitted in formalin to pathology for further handling. Limited postprocedure imaging demonstrated no complicating feature. The patient tolerated the procedure well, and was transferred to recovery in stable condition. IMPRESSION: Successful CT-guided core needle biopsy of liver lesion in the inferior posterior right hepatic lobe. Technically challenging biopsy due to poor differentiation of the liver lesion from adjacent liver parenchyma and reliance on anatomic landmarks. Electronically Signed   By:  Albin Felling M.D.   On: 09/28/2022 12:38   MR LIVER W WO CONTRAST  Result Date: 09/06/2022 CLINICAL DATA:  Metastatic non-small cell lung cancer follow-up liver lesions seen on recent PET-CT. EXAM: MRI ABDOMEN WITHOUT AND WITH CONTRAST TECHNIQUE: Multiplanar multisequence MR imaging of the abdomen was performed both before and after the administration of intravenous contrast. CONTRAST:  38mL GADAVIST GADOBUTROL 1 MMOL/ML IV SOLN COMPARISON:  PET-CT 08/15/2022 FINDINGS: Lower chest: Large complex left pleural fluid collection. No obvious right-sided pulmonary lesions. No pericardial effusion. Hepatobiliary: Diffuse hepatic metastatic disease with numerous lesions throughout both lobes of the liver. The large is lesion in the left lobe measures 4.5 x 3.7 cm. The largest lesion in the right lobe and segment 6 measures 3.7 x 2.3 cm. 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 The gallbladder is contracted and surrounded by fluid. No common bile duct dilatation. Pancreas:  No mass, inflammation or ductal dilatation. Spleen:  Normal size. No focal lesions. The splenic vein is patent. Adrenals/Urinary Tract:  Adrenal glands and kidneys are. Stomach/Bowel: The stomach, duodenum, visualized small visualized colon are grossly. Vascular/Lymphatic: The aorta and branch vessels are normal. The major venous structures are patent except for  the portal vein as detailed above. Periportal adenopathy as demonstrated on the PET scan. Other:  Small volume ascites. Musculoskeletal: No significant bony findings. IMPRESSION: 1. Diffuse hepatic metastatic disease with numerous lesions throughout both lobes of the liver. 2. 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 3. Periportal adenopathy as demonstrated on the PET scan. 4. Large complex left pleural fluid collection. 5. Small volume ascites. Electronically Signed   By: Marijo Sanes M.D.   On: 09/06/2022 11:48   US Abdomen Limited  Result Date: 08/31/2022 CLINICAL DATA:  Liver mass on PET EXAM: ULTRASOUND ABDOMEN LIMITED RIGHT UPPER QUADRANT COMPARISON:  PET-CT August 15, 2022 FINDINGS: Limited ultrasound of the liver was performed for biopsy planning purposes. Sonographic images of the liver do not demonstrate a focal liver lesion amenable for percutaneous biopsy. An ultrasound correlate to the findings on recent PET-CT was not identified. No biopsy performed. IMPRESSION: No focal liver lesion identified by ultrasound correlating to recent findings on PET-CT. Further evaluation of these liver lesions with contrast-enhanced MRI is recommended. This was communicated to the referring Oncology team. Electronically Signed   By: Albin Felling M.D.   On: 08/31/2022 13:57    Labs:  CBC: Recent Labs    09/11/22 0818 09/18/22 1105 09/25/22 1015 09/28/22 0958  WBC 7.5 2.7* 15.1* 8.6  HGB 13.2 13.3 12.7* 12.6*  HCT 40.1 40.4 39.0 39.0  PLT 300 126* 119* 155    COAGS: Recent Labs    08/23/22 1100 08/31/22 0952 09/28/22 0958  INR 1.2 1.2 1.1    BMP: Recent Labs    09/11/22 0818 09/18/22 1105 09/25/22 1015 09/28/22 0958  NA 134* 137 140 137  K 4.2 3.8 3.4* 4.1  CL 106 102 107 107  CO2 25 26 26 26   GLUCOSE 90 129* 148* 91  BUN 9 9 7 11   CALCIUM 8.2* 8.5* 8.1* 8.7*  CREATININE 0.80 0.91 0.89 0.88   GFRNONAA >60 >60 >60 >60    LIVER FUNCTION TESTS: Recent Labs    09/11/22 0818 09/18/22 1105 09/25/22 1015 09/28/22 0958  BILITOT 0.6 0.5 0.4 0.6  AST 103* 76* 67* 61*  ALT  100* 97* 77* 69*  ALKPHOS 336* 260* 261* 234*  PROT 6.9 6.9 6.7 6.8  ALBUMIN 3.0* 3.2* 3.2* 3.3*    TUMOR MARKERS: No results for input(s): "AFPTM", "CEA", "CA199", "CHROMGRNA" in the last 8760 hours.  Assessment and Plan:  [name] is a 41 yo male being seen today for stage IV lung cancer with suspected metastatic liver lesions. The patient has previously been seen for ultrasound-guided biopsy which was unable to be performed due to lesions not being visualized on ultrasound. The patient was subsequently rescheduled for CT-guided liver biopsy to be performed today. Case has been reviewed with Dr Denna Haggard.   Risks and benefits of image-guided liver biopsy was discussed with the patient and/or patient's family including, but not limited to bleeding, infection, damage to adjacent structures or low yield requiring additional tests.  All of the questions were answered and there is agreement to proceed.  Consent signed and in chart.   Thank you for this interesting consult.  I greatly enjoyed meeting [name] and look forward to participating in their care.  A copy of this report was sent to the requesting provider on this date.  Electronically Signed: Lura Em, PA-C 09/28/2022, 5:10 PM   I spent a total of   15 Minutes in face to face in clinical consultation, greater than 50% of which was counseling/coordinating care for liver lesion biopsy

## 2022-09-28 NOTE — Procedures (Signed)
Interventional Radiology Procedure Note  Date of Procedure: 09/28/2022  Procedure: CT liver lesion biopsy    Findings:  1. CT liver lesion biopsy of posterior right hepatic lobe, 35KK x4    Complications: No immediate complications noted.   Estimated Blood Loss: minimal  Follow-up and Recommendations: 1. Bedrest 2 hours, first hour right side down    Albin Felling, MD  Vascular & Interventional Radiology  09/28/2022 12:21 PM

## 2022-09-28 NOTE — Progress Notes (Signed)
Patient clinically stable post Liver Biopsy per Dr Denna Haggard, tolerated well. Denies complaints post procedure. Vitals stable pre and post procedure. Received Versed 2 mg along with Fentanyl 100 mcg IV for procedure. Discharge instructions given to wife and patient post procedure.

## 2022-10-01 MED FILL — Dexamethasone Sodium Phosphate Inj 100 MG/10ML: INTRAMUSCULAR | Qty: 1 | Status: AC

## 2022-10-02 ENCOUNTER — Encounter: Payer: Self-pay | Admitting: Internal Medicine

## 2022-10-02 ENCOUNTER — Inpatient Hospital Stay (HOSPITAL_BASED_OUTPATIENT_CLINIC_OR_DEPARTMENT_OTHER): Payer: Managed Care, Other (non HMO) | Admitting: Internal Medicine

## 2022-10-02 ENCOUNTER — Inpatient Hospital Stay: Payer: Managed Care, Other (non HMO)

## 2022-10-02 VITALS — BP 125/90 | HR 86 | Temp 97.8°F | Resp 18 | Wt 217.4 lb

## 2022-10-02 DIAGNOSIS — C3432 Malignant neoplasm of lower lobe, left bronchus or lung: Secondary | ICD-10-CM

## 2022-10-02 DIAGNOSIS — Z7189 Other specified counseling: Secondary | ICD-10-CM | POA: Insufficient documentation

## 2022-10-02 DIAGNOSIS — Z5112 Encounter for antineoplastic immunotherapy: Secondary | ICD-10-CM | POA: Diagnosis not present

## 2022-10-02 DIAGNOSIS — Z5111 Encounter for antineoplastic chemotherapy: Secondary | ICD-10-CM | POA: Diagnosis not present

## 2022-10-02 LAB — URINALYSIS, DIPSTICK ONLY
Bilirubin Urine: NEGATIVE
Glucose, UA: NEGATIVE mg/dL
Hgb urine dipstick: NEGATIVE
Ketones, ur: NEGATIVE mg/dL
Leukocytes,Ua: NEGATIVE
Nitrite: NEGATIVE
Protein, ur: NEGATIVE mg/dL
Specific Gravity, Urine: 1.017 (ref 1.005–1.030)
pH: 5 (ref 5.0–8.0)

## 2022-10-02 LAB — COMPREHENSIVE METABOLIC PANEL
ALT: 74 U/L — ABNORMAL HIGH (ref 0–44)
AST: 69 U/L — ABNORMAL HIGH (ref 15–41)
Albumin: 3.4 g/dL — ABNORMAL LOW (ref 3.5–5.0)
Alkaline Phosphatase: 211 U/L — ABNORMAL HIGH (ref 38–126)
Anion gap: 5 (ref 5–15)
BUN: 7 mg/dL (ref 6–20)
CO2: 25 mmol/L (ref 22–32)
Calcium: 8.6 mg/dL — ABNORMAL LOW (ref 8.9–10.3)
Chloride: 107 mmol/L (ref 98–111)
Creatinine, Ser: 0.91 mg/dL (ref 0.61–1.24)
GFR, Estimated: >60 mL/min (ref >60)
Glucose, Bld: 93 mg/dL (ref 70–99)
Potassium: 4 mmol/L (ref 3.5–5.1)
Sodium: 137 mmol/L (ref 135–145)
Total Bilirubin: 0.5 mg/dL (ref 0.3–1.2)
Total Protein: 7 g/dL (ref 6.5–8.1)

## 2022-10-02 LAB — CBC WITH DIFFERENTIAL/PLATELET
Abs Immature Granulocytes: 0.07 10*3/uL (ref 0.00–0.07)
Basophils Absolute: 0.1 10*3/uL (ref 0.0–0.1)
Basophils Relative: 2 %
Eosinophils Absolute: 0 10*3/uL (ref 0.0–0.5)
Eosinophils Relative: 0 %
HCT: 37.8 % — ABNORMAL LOW (ref 39.0–52.0)
Hemoglobin: 12.4 g/dL — ABNORMAL LOW (ref 13.0–17.0)
Immature Granulocytes: 1 %
Lymphocytes Relative: 16 %
Lymphs Abs: 1.2 10*3/uL (ref 0.7–4.0)
MCH: 29 pg (ref 26.0–34.0)
MCHC: 32.8 g/dL (ref 30.0–36.0)
MCV: 88.3 fL (ref 80.0–100.0)
Monocytes Absolute: 0.9 10*3/uL (ref 0.1–1.0)
Monocytes Relative: 12 %
Neutro Abs: 4.9 10*3/uL (ref 1.7–7.7)
Neutrophils Relative %: 69 %
Platelets: 249 10*3/uL (ref 150–400)
RBC: 4.28 MIL/uL (ref 4.22–5.81)
RDW: 19.8 % — ABNORMAL HIGH (ref 11.5–15.5)
WBC: 7.2 10*3/uL (ref 4.0–10.5)
nRBC: 0 % (ref 0.0–0.2)

## 2022-10-02 LAB — TSH: TSH: 3.113 u[IU]/mL (ref 0.350–4.500)

## 2022-10-02 MED ORDER — FAMOTIDINE IN NACL 20-0.9 MG/50ML-% IV SOLN
20.0000 mg | Freq: Once | INTRAVENOUS | Status: AC
  Administered 2022-10-02: 20 mg via INTRAVENOUS
  Filled 2022-10-02: qty 50

## 2022-10-02 MED ORDER — DIPHENHYDRAMINE HCL 50 MG/ML IJ SOLN
50.0000 mg | Freq: Once | INTRAMUSCULAR | Status: AC
  Administered 2022-10-02: 50 mg via INTRAVENOUS
  Filled 2022-10-02: qty 1

## 2022-10-02 MED ORDER — SODIUM CHLORIDE 0.9 % IV SOLN
15.0000 mg/kg | Freq: Once | INTRAVENOUS | Status: AC
  Administered 2022-10-02: 1600 mg via INTRAVENOUS
  Filled 2022-10-02: qty 64

## 2022-10-02 MED ORDER — SODIUM CHLORIDE 0.9 % IV SOLN
750.0000 mg | Freq: Once | INTRAVENOUS | Status: AC
  Administered 2022-10-02: 750 mg via INTRAVENOUS
  Filled 2022-10-02: qty 75

## 2022-10-02 MED ORDER — SODIUM CHLORIDE 0.9 % IV SOLN
150.0000 mg | Freq: Once | INTRAVENOUS | Status: AC
  Administered 2022-10-02: 150 mg via INTRAVENOUS
  Filled 2022-10-02: qty 150

## 2022-10-02 MED ORDER — SODIUM CHLORIDE 0.9 % IV SOLN
1200.0000 mg | Freq: Once | INTRAVENOUS | Status: AC
  Administered 2022-10-02: 1200 mg via INTRAVENOUS
  Filled 2022-10-02: qty 20

## 2022-10-02 MED ORDER — SODIUM CHLORIDE 0.9 % IV SOLN
175.0000 mg/m2 | Freq: Once | INTRAVENOUS | Status: AC
  Administered 2022-10-02: 396 mg via INTRAVENOUS
  Filled 2022-10-02: qty 66

## 2022-10-02 MED ORDER — SODIUM CHLORIDE 0.9 % IV SOLN
10.0000 mg | Freq: Once | INTRAVENOUS | Status: AC
  Administered 2022-10-02: 10 mg via INTRAVENOUS
  Filled 2022-10-02: qty 10

## 2022-10-02 MED ORDER — PALONOSETRON HCL INJECTION 0.25 MG/5ML
0.2500 mg | Freq: Once | INTRAVENOUS | Status: AC
  Administered 2022-10-02: 0.25 mg via INTRAVENOUS
  Filled 2022-10-02: qty 5

## 2022-10-02 MED ORDER — SODIUM CHLORIDE 0.9 % IV SOLN
Freq: Once | INTRAVENOUS | Status: AC
  Filled 2022-10-02: qty 250

## 2022-10-02 MED ORDER — HEPARIN SOD (PORK) LOCK FLUSH 100 UNIT/ML IV SOLN
500.0000 [IU] | Freq: Once | INTRAVENOUS | Status: DC | PRN
  Filled 2022-10-02: qty 5

## 2022-10-02 NOTE — Progress Notes (Signed)
Patient here today for follow up and treatment consideration regarding lung cancer. 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 Patient would like to talk to MD about any adjustments that could possibly be made to dosing of neulasta to help with side effects of injection.

## 2022-10-02 NOTE — Patient Instructions (Signed)
[facility] AT [place]  Discharge Instructions: Thank you for choosing [facility] to provide your oncology and hematology care.  If you have a lab appointment with the [facility], please go directly to the [facility] and check in at the registration area.  Wear comfortable clothing and clothing appropriate for easy access to any Portacath or PICC line.   We strive to give you quality time with your provider. You may need to reschedule your appointment if you arrive late (15 or more minutes).  Arriving late affects you and other patients whose appointments are after yours.  Also, if you miss three or more appointments without notifying the office, you may be dismissed from the clinic at the provider's discretion.      For prescription refill requests, have your pharmacy contact our office and allow 72 hours for refills to be completed.    Today you received the following chemotherapy and/or immunotherapy agents CARBOPLATIN, TECENTRIQ, MVASI, TAXOL      To help prevent nausea and vomiting after your treatment, we encourage you to take your nausea medication as directed.  BELOW ARE SYMPTOMS THAT SHOULD BE REPORTED IMMEDIATELY: *FEVER GREATER THAN 100.4 F (38 C) OR HIGHER *CHILLS OR SWEATING *NAUSEA AND VOMITING THAT IS NOT CONTROLLED WITH YOUR NAUSEA MEDICATION *UNUSUAL SHORTNESS OF BREATH *UNUSUAL BRUISING OR BLEEDING *URINARY PROBLEMS (pain or burning when urinating, or frequent urination) *BOWEL PROBLEMS (unusual diarrhea, constipation, pain near the anus) TENDERNESS IN MOUTH AND THROAT WITH OR WITHOUT PRESENCE OF ULCERS (sore throat, sores in mouth, or a toothache) UNUSUAL RASH, SWELLING OR PAIN  UNUSUAL VAGINAL DISCHARGE OR ITCHING   Items with * indicate a potential emergency and should be followed up as soon as possible or go to the Emergency Department if any problems should occur.  Please show the CHEMOTHERAPY ALERT CARD or IMMUNOTHERAPY  ALERT CARD at check-in to the Emergency Department and triage nurse.  Should you have questions after your visit or need to cancel or reschedule your appointment, please contact [facility] [place] AT [place]  [phone] and follow the prompts.  Office hours are 8:00 a.m. to 4:30 p.m. Monday - Friday. Please note that voicemails left after 4:00 p.m. may not be returned until the following business day.  We are closed weekends and major holidays. You have access to a nurse at all times for urgent questions. Please call the main number to the clinic [phone] and follow the prompts.  For any non-urgent questions, you may also contact your provider using MyChart. We now offer e-Visits for anyone 30 and older to request care online for non-urgent symptoms. For details visit [email or web address].   Also download the MyChart app! Go to the app store, search "MyChart", open the app, select [facility], and log in with your MyChart username and password.  Masks are optional in the cancer centers. If you would like for your care team to wear a mask while they are taking care of you, please let them know. For doctor visits, patients may have with them one support person who is at least 43 years old. At this time, visitors are not allowed in the infusion area.

## 2022-10-02 NOTE — Progress Notes (Signed)
[place] NOTE  Patient Care Team: Pcp, No as PCP - General [name], RN as Oncology Nurse Navigator [name], MD as Consulting Physician (Internal Medicine)  CHIEF COMPLAINTS/PURPOSE OF CONSULTATION: Lung cancer  #  Oncology History Overview Note  IMPRESSION: 1. Patchy nodular fat stranding throughout the anterior left upper quadrant peritoneal fat, nonspecific, cannot exclude peritoneal carcinomatosis. Dedicated CT abdomen/pelvis with oral and IV contrast recommended for further evaluation. 2. Dense patchy consolidation replacing much of the left lower lung lobe, appearing masslike in the superior segment left lower lobe, with associated bulging of the left major fissure and associated left lower lobe volume loss. Fine nodularity throughout both lungs with an upper lobe predominance. Asymmetric left upper lobe interlobular septal thickening. These findings are indeterminate, with differential including multilobar pneumonia, sarcoidosis or a neoplastic process. The persistence on radiographs back to 01/20/2021 despite antibiotic therapy make sarcoidosis or a neoplastic process more likely. Pulmonology consultation suggested for consideration of bronchoscopic evaluation. 3. Small dependent left pleural effusion. 4. Mild mediastinal lymphadenopathy, nonspecific. 5. Subacute healing lateral right sixth rib fracture.   DIAGNOSIS:  A. LUNG, LEFT LOWER LOBE; ENB-ASSISTED BIOPSY:  - NON-SMALL CELL CARCINOMA, FAVOR ADENOCARCINOMA.  - FOREIGN MATERIAL SUGGESTIVE OF ASPIRATION.   # EGFR MUTATED: 85 del- June 28th, 2022- [name]. [limited]  # Asymptomatic multiple brain mets subcentimeter asymptomatic -JUNE 29th, 2023- Numerous metastatic lesions in the supratentorial brain-increase in number also conspicuity compared to January 2023.  Pre-SBRT MRI brain AUG 18th, 2023-  Eighteen small enhancing brain metastases (punctate to 11 mm); were all  present on 03/28/2021-s/p SBRT SEP 2023- [[place]]  # SEP-OCT-worsening left-sided pleural effusion-status post paracentesis; exudative; Cytology negative- ?  Progressive disease..   Cancer of lower lobe of left lung ([place])  03/23/2021 Initial Diagnosis   Cancer of lower lobe of left lung ([place])   03/29/2021 Cancer Staging   Staging form: Lung, AJCC 8th Edition - Clinical: Stage IVB (cT3, cN3, pM1c) - Signed by [name], MD on 03/29/2021   03/30/2021 - 03/30/2021 Chemotherapy   Patient is on Treatment Plan : LUNG NSCLC Pemetrexed ([place]) / Carboplatin q21d x 1 cycles     08/17/2022 -  Chemotherapy   Patient is on Treatment Plan : LUNG Atezolizumab + Bevacizumab + Carboplatin + Paclitaxel q21d Induction x 4 cycles / Atezolizumab + Bevacizumab q21d Maintenance      HISTORY OF PRESENTING ILLNESS: Ambulating independently.  With wife.   [name] 41 y.o.  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   Patient had a liver biopsy last week.  Results are pending.  He has occasional nosebleeds since starting bevacizumab. He reports extreme fatigue and flulike symptoms with Neulasta injection and inquiring if dose reduction is possible. Sharp pain in right foot managed with gabapentin   Review of Systems  Constitutional:  Positive for malaise/fatigue. Negative for chills, diaphoresis, fever and weight loss.  HENT:  Negative for nosebleeds and sore throat.   Eyes:  Negative for double vision.  Respiratory:  Negative for cough, sputum production, shortness of breath and wheezing.   Cardiovascular:  Negative for chest pain, palpitations, orthopnea and leg swelling.  Gastrointestinal:  Negative for abdominal pain, blood in stool, constipation, diarrhea, heartburn, melena and nausea.   Genitourinary:  Negative for dysuria, frequency and urgency.  Musculoskeletal:  Negative  for back pain and joint pain.  Skin:  Negative for itching.  Neurological:  Negative for dizziness, tingling, focal weakness, weakness and headaches.  Endo/Heme/Allergies:  Does not bruise/bleed easily.  Psychiatric/Behavioral:  Negative for depression. The patient is not nervous/anxious and does not have insomnia.      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    SURGICAL HISTORY: Past Surgical History:  Procedure Laterality Date   FRACTURE SURGERY     broke femur when he was 6 years   PORTA CATH INSERTION N/A 03/28/2021   Procedure: PORTA CATH INSERTION;  Surgeon: Katha Cabal, MD;  Location: [facility];  Service: Cardiovascular;  Laterality: N/A;   VIDEO BRONCHOSCOPY WITH ENDOBRONCHIAL NAVIGATION N/A 03/15/2021   Procedure: VIDEO BRONCHOSCOPY WITH ENDOBRONCHIAL NAVIGATION;  Surgeon: Ottie Glazier, MD;  Location: [facility];  Service: Thoracic;  Laterality: N/A;   VIDEO BRONCHOSCOPY WITH ENDOBRONCHIAL ULTRASOUND N/A 03/15/2021   Procedure: VIDEO BRONCHOSCOPY WITH ENDOBRONCHIAL ULTRASOUND;  Surgeon: Ottie Glazier, MD;  Location: [facility];  Service: Thoracic;  Laterality: N/A;    SOCIAL HISTORY: Social History   Socioeconomic History   Marital status: Married    Spouse name: [name]   Number of children: 2   Years of education: Not on file   Highest education level: Not on file  Occupational History   Not on file  Tobacco Use   Smoking status: Never   Smokeless tobacco: Former    Types: Snuff, Chew  Vaping Use   Vaping Use: Never used  Substance and Sexual Activity   Alcohol use: Never   Drug use: Never   Sexual activity: Yes  Other Topics Concern   Not on file  Social History Narrative   [place] in [place]; with wife; 2 daughters[12 and  22]; never smoked; rare alcohol. 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   Social Determinants of Health   Financial Resource Strain: Low Risk  (05/10/2022)   Overall Financial Resource Strain (CARDIA)    Difficulty of Paying Living Expenses: Not hard at all  Food Insecurity: No Food Insecurity (05/10/2022)   Hunger Vital Sign    Worried About Running Out of Food in the Last Year: Never true    Ran Out of Food in the Last Year: Never true  Transportation Needs: No Transportation Needs (03/16/2022)   PRAPARE - Hydrologist (Medical): No    Lack of Transportation (Non-Medical): No  Physical Activity: Not on file  Stress: Not on file  Social Connections: Socially Integrated (05/10/2022)   Social Connection and Isolation Panel [NHANES]    Frequency of Communication with Friends and Family: More than three times a week    Frequency of Social Gatherings with Friends and Family: More than three times a week    Attends Religious Services: More than 4 times per year    Active Member of [employer] or Organizations: Yes    Attends Music therapist: More than 4 times per year    Marital Status: Married  Human resources officer Violence: Not on file    FAMILY HISTORY: Family History  Problem Relation Age of Onset   High Cholesterol Mother    Hypertension Father    Cancer Father    Lung cancer Father    Skin cancer Father  ALLERGIES:  is allergic to codeine.  MEDICATIONS:  Current Outpatient Medications  Medication Sig Dispense Refill   apixaban (ELIQUIS) 5 MG TABS tablet Take 1 tablet (5 mg total) by mouth 2 (two) times daily. 60 tablet 2   folic acid (FOLVITE) 1 MG tablet Take 1 tablet (1 mg total) by mouth daily. 90 tablet 1   gabapentin (NEURONTIN) 100 MG capsule Take 1 capsule (100 mg total) by mouth at bedtime. 30 capsule 1   influenza vac split quadrivalent PF (FLUARIX QUADRIVALENT) 0.5 ML injection Inject into the muscle. 0.5 mL 0   lidocaine-prilocaine  (EMLA) cream Apply 1 application topically as needed. 30 g 0   loratadine (CLARITIN) 10 MG tablet Take 10 mg by mouth daily. For 5 days after the treatment.     metoprolol succinate (TOPROL-XL) 50 MG 24 hr tablet Take 50 mg by mouth daily. Take with or immediately following a meal.     omeprazole (PRILOSEC) 20 MG capsule Take 1 capsule (20 mg total) by mouth daily. 30 capsule 2   ondansetron (ZOFRAN) 8 MG tablet One pill every 8 hours as needed for nausea/vomitting. 40 tablet 1   oxyCODONE (OXY IR/ROXICODONE) 5 MG immediate release tablet Take 1 tablet (5 mg total) by mouth every 4 (four) hours as needed for severe pain. 30 tablet 0   potassium chloride SA (KLOR-CON M) 20 MEQ tablet Take 1 tablet (20 mEq total) by mouth daily. 14 tablet 0   prochlorperazine (COMPAZINE) 10 MG tablet Take 1 tablet (10 mg total) by mouth every 6 (six) hours as needed for nausea or vomiting. 40 tablet 1   sertraline (ZOLOFT) 50 MG tablet TAKE 1/2 TABLET BY MOUTH DAILY 30 tablet 2   traMADol (ULTRAM) 50 MG tablet Take 50 mg by mouth every 6 (six) hours as needed for moderate pain or severe pain.     calcium carbonate (TUMS EX) 750 MG chewable tablet Chew 1 tablet by mouth daily. (Patient not taking: Reported on 08/27/2022)     dronabinol (MARINOL) 2.5 MG capsule Take 1 capsule (2.5 mg total) by mouth 2 (two) times daily before lunch and supper. (Patient not taking: Reported on 08/27/2022) 60 capsule 0   No current facility-administered medications for this visit.   Facility-Administered Medications Ordered in Other Visits  Medication Dose Route Frequency Provider Last Rate Last Admin   CARBOplatin (PARAPLATIN) 750 mg in sodium chloride 0.9 % 250 mL chemo infusion  750 mg Intravenous Once Charlaine Dalton R, MD       heparin lock flush 100 UNIT/ML injection            heparin lock flush 100 UNIT/ML injection            heparin lock flush 100 unit/mL  500 Units Intracatheter Once PRN [name], MD        PACLitaxel (TAXOL) 396 mg in sodium chloride 0.9 % 500 mL chemo infusion (> 48m/m2)  175 mg/m2 (Treatment Plan Recorded) Intravenous Once BCammie Sickle MD 189 mL/hr at 10/02/22 1236 396 mg at 10/02/22 1236   sodium chloride flush (NS) 0.9 % injection 10 mL  10 mL Intravenous PRN BCammie Sickle MD   10 mL at 07/28/21 0852    PHYSICAL EXAMINATION: ECOG PERFORMANCE STATUS: 1 - Symptomatic but completely ambulatory  Vitals:   10/02/22 0903  BP: (!) 125/90  Pulse: 86  Resp: 18  Temp: 97.8 F (36.6 C)  SpO2: 99%   Filed Weights   10/02/22 0903  Weight: 217 lb 6.4 oz (98.6 kg)    Physical Exam Constitutional:      Appearance: He is not ill-appearing.  HENT:     Head: Normocephalic and atraumatic.  Cardiovascular:     Rate and Rhythm: Normal rate and regular rhythm.  Pulmonary:     Effort: No respiratory distress.     Breath sounds: No wheezing.     Comments: Decreased breath sound on the left side compared to right. Abdominal:     General: There is no distension.     Palpations: Abdomen is soft.     Tenderness: There is no abdominal tenderness.  Musculoskeletal:        General: No deformity.  Skin:    General: Skin is warm.     Coloration: Skin is not pale.  Neurological:     Mental Status: He is alert and oriented to person, place, and time.  Psychiatric:        Mood and Affect: Mood and affect normal.        Behavior: Behavior normal.    LABORATORY DATA:  I have reviewed the data as listed Lab Results  Component Value Date   WBC 7.2 10/02/2022   HGB 12.4 (L) 10/02/2022   HCT 37.8 (L) 10/02/2022   MCV 88.3 10/02/2022   PLT 249 10/02/2022   Recent Labs    09/25/22 1015 09/28/22 0958 10/02/22 0840  NA 140 137 137  K 3.4* 4.1 4.0  CL 107 107 107  CO2 _0 GLUCOSE 148* 91 93  BUN _1 CREATININE 0.89 0.88 0.91  CALCIUM 8.1* 8.7* 8.6*  GFRNONAA >60 >60 >60  PROT 6.7 6.8 7.0  ALBUMIN 3.2* 3.3* 3.4*  AST 67* 61* 69*  ALT 77* 69*  74*  ALKPHOS 261* 234* 211*  BILITOT 0.4 0.6 0.5     RADIOGRAPHIC STUDIES: I have personally reviewed the radiological images as listed and agreed with the findings in the report. CT LIVER MASS BIOPSY  Result Date: 09/28/2022 INDICATION: Concern for metastatic disease to the liver EXAM: CT-guided core needle biopsy of liver lesion MEDICATIONS: None. ANESTHESIA/SEDATION: Moderate (conscious) sedation was employed during this procedure. A total of Versed 2 mg and Fentanyl 100 mcg was administered intravenously. Moderate Sedation Time: 26 minutes. The patient's level of consciousness and vital signs were monitored continuously by radiology nursing throughout the procedure under my direct supervision. FLUOROSCOPY TIME:  N/a COMPLICATIONS: None immediate. PROCEDURE: Informed written consent was obtained from the patient after a thorough discussion of the procedural risks, benefits and alternatives. All questions were addressed. Maximal Sterile Barrier Technique was utilized including caps, mask, sterile gowns, sterile gloves, sterile drape, hand hygiene and skin antiseptic. A timeout was performed prior to the initiation of the procedure. The patient was placed supine on the exam table. Limited CT of the abdomen and pelvis with IV contrast was performed for planning purposes. This demonstrated faintly hypoenhancing lesions in the liver, compatible with both recent MRI and PET-CT. The inferior anterior right hepatic lobe lesion was initially selected for biopsy. Skin entry site was marked, and the overlying skin was prepped and draped in the standard sterile fashion. Local analgesia was obtained with 1% lidocaine. Using intermittent CT fluoroscopy, a 17 gauge introducer needle was advanced towards the identified lesion. During needle positioning, the location of the liver was noted to change result of the patient's breathing, and this biopsy target was abandoned due to risk of injury to adjacent structures.  Therefore,  the biopsy target was changed to the inferior posterior right hepatic lesion. Again using CT guidance, a 17 gauge introducer needle was advanced towards the identified lesion using anatomic landmarks. Subsequently, core needle biopsy was performed using an 18 gauge core biopsy device x4 total passes. Specimens were submitted in formalin to pathology for further handling. Limited postprocedure imaging demonstrated no complicating feature. The patient tolerated the procedure well, and was transferred to recovery in stable condition. IMPRESSION: Successful CT-guided core needle biopsy of liver lesion in the inferior posterior right hepatic lobe. Technically challenging biopsy due to poor differentiation of the liver lesion from adjacent liver parenchyma and reliance on anatomic landmarks. Electronically Signed   By: Albin Felling M.D.   On: 09/28/2022 12:38   MR LIVER W WO CONTRAST  Result Date: 09/06/2022 CLINICAL DATA:  Metastatic non-small cell lung cancer follow-up liver lesions seen on recent PET-CT. EXAM: MRI ABDOMEN WITHOUT AND WITH CONTRAST TECHNIQUE: Multiplanar multisequence MR imaging of the abdomen was performed both before and after the administration of intravenous contrast. CONTRAST:  20m GADAVIST GADOBUTROL 1 MMOL/ML IV SOLN COMPARISON:  PET-CT 08/15/2022 FINDINGS: Lower chest: Large complex left pleural fluid collection. No obvious right-sided pulmonary lesions. No pericardial effusion. Hepatobiliary: Diffuse hepatic metastatic disease with numerous lesions throughout both lobes of the liver. The large is lesion in the left lobe measures 4.5 x 3.7 cm. The largest lesion in the right lobe and segment 6 measures 3.7 x 2.3 cm. 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 The gallbladder is contracted and surrounded by fluid. No common bile duct dilatation. Pancreas:  No mass, inflammation or ductal dilatation.  Spleen:  Normal size. No focal lesions. The splenic vein is patent. Adrenals/Urinary Tract:  Adrenal glands and kidneys are. Stomach/Bowel: The stomach, duodenum, visualized small visualized colon are grossly. Vascular/Lymphatic: The aorta and branch vessels are normal. The major venous structures are patent except for the portal vein as detailed above. Periportal adenopathy as demonstrated on the PET scan. Other:  Small volume ascites. Musculoskeletal: No significant bony findings. IMPRESSION: 1. Diffuse hepatic metastatic disease with numerous lesions throughout both lobes of the liver. 2. 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 3. Periportal adenopathy as demonstrated on the PET scan. 4. Large complex left pleural fluid collection. 5. Small volume ascites. Electronically Signed   By: PMarijo SanesM.D.   On: 09/06/2022 11:48    ASSESSMENT & PLAN:   No problem-specific Assessment & Plan notes found for this encounter.  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 Progressed on osimertinib 80 mg a day- 08/15/22 PET showed progression in liver, new fdg avid nodule w/I central left midlung, new small volume ascites within right iliac fossa and central pelvis with mild adjacent thickening of the peritoneal reflections. Possible lymphangitic spread. Awaiting liver biopsy with repeat NGS. .Marland KitchenHe [facility] No changes currently. Per patient, currently no trials available.   Here for cycle 3 of carbo-taxol-atezo-bev chemotherapy with neulasta support. He receives neulasta at home on D2.  Patient complaining of extreme fatigue with Neulasta.  We discussed about decreasing the dose from 6 mg to 4 mg and assess for tolerability next time.   Brain metastases- asymptomatic -JUNE 29th, 2023- Numerous metastatic lesions in the supratentorial brain-increase in  number also conspicuity  compared to January 2023.  Pre-SBRT MRI brain AUG 18th, 2023-  Eighteen small enhancing brain metastases (punctate to 11 mm); were all present on 03/28/2021-s/p SBRT SEP 2023- [[place]].  August 15, 2022 brain MRI-overall stable.  Discussed with radiation oncology at [facility].  Repeat brain MRI in February.    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 Continue eliquis.    Bone metastases- CT Dec 7th, 2022 to healing process. Dental evaluation on 9/21.  Continue calcium & vitamin D intake. Calcium 8.1- HOLD for now.    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   # IV access/- port functioning appropriately  DISPOSITION:  RTC in 1 week for APP visit for labs, possible hydration RTC in 3 weeks with Dr. Jacinto Reap, labs, infusion.  All questions were answered. The patient knows to call the clinic with any problems, questions or concerns.   Jane Canary, MD 10/02/2022

## 2022-10-02 NOTE — Patient Instructions (Signed)
Cut down the dose of neulasta from 6 mg to 4 mg.

## 2022-10-03 LAB — T4: T4, Total: 7.3 ug/dL (ref 4.5–12.0)

## 2022-10-04 ENCOUNTER — Other Ambulatory Visit: Payer: Self-pay | Admitting: Internal Medicine

## 2022-10-04 DIAGNOSIS — C3432 Malignant neoplasm of lower lobe, left bronchus or lung: Secondary | ICD-10-CM

## 2022-10-04 LAB — CEA: CEA: 9 ng/mL — ABNORMAL HIGH (ref 0.0–4.7)

## 2022-10-05 ENCOUNTER — Other Ambulatory Visit: Payer: Self-pay | Admitting: *Deleted

## 2022-10-05 DIAGNOSIS — C3432 Malignant neoplasm of lower lobe, left bronchus or lung: Secondary | ICD-10-CM

## 2022-10-09 ENCOUNTER — Other Ambulatory Visit: Payer: Self-pay | Admitting: *Deleted

## 2022-10-09 ENCOUNTER — Inpatient Hospital Stay (HOSPITAL_BASED_OUTPATIENT_CLINIC_OR_DEPARTMENT_OTHER): Payer: Managed Care, Other (non HMO) | Admitting: Medical Oncology

## 2022-10-09 ENCOUNTER — Inpatient Hospital Stay: Payer: Managed Care, Other (non HMO)

## 2022-10-09 VITALS — BP 135/97 | HR 92 | Temp 96.4°F

## 2022-10-09 DIAGNOSIS — R5383 Other fatigue: Secondary | ICD-10-CM | POA: Diagnosis not present

## 2022-10-09 DIAGNOSIS — E871 Hypo-osmolality and hyponatremia: Secondary | ICD-10-CM

## 2022-10-09 DIAGNOSIS — E860 Dehydration: Secondary | ICD-10-CM

## 2022-10-09 DIAGNOSIS — Z5112 Encounter for antineoplastic immunotherapy: Secondary | ICD-10-CM | POA: Diagnosis not present

## 2022-10-09 DIAGNOSIS — D702 Other drug-induced agranulocytosis: Secondary | ICD-10-CM

## 2022-10-09 DIAGNOSIS — C3432 Malignant neoplasm of lower lobe, left bronchus or lung: Secondary | ICD-10-CM

## 2022-10-09 LAB — CBC WITH DIFFERENTIAL/PLATELET
Abs Immature Granulocytes: 0.02 10*3/uL (ref 0.00–0.07)
Basophils Absolute: 0 10*3/uL (ref 0.0–0.1)
Basophils Relative: 3 %
Eosinophils Absolute: 0.1 10*3/uL (ref 0.0–0.5)
Eosinophils Relative: 4 %
HCT: 39.4 % (ref 39.0–52.0)
Hemoglobin: 13.3 g/dL (ref 13.0–17.0)
Immature Granulocytes: 2 %
Lymphocytes Relative: 52 %
Lymphs Abs: 0.7 10*3/uL (ref 0.7–4.0)
MCH: 29.4 pg (ref 26.0–34.0)
MCHC: 33.8 g/dL (ref 30.0–36.0)
MCV: 87.2 fL (ref 80.0–100.0)
Monocytes Absolute: 0.1 10*3/uL (ref 0.1–1.0)
Monocytes Relative: 9 %
Neutro Abs: 0.4 10*3/uL — CL (ref 1.7–7.7)
Neutrophils Relative %: 30 %
Platelets: 128 10*3/uL — ABNORMAL LOW (ref 150–400)
RBC: 4.52 MIL/uL (ref 4.22–5.81)
RDW: 18.7 % — ABNORMAL HIGH (ref 11.5–15.5)
WBC: 1.2 10*3/uL — CL (ref 4.0–10.5)
nRBC: 0 % (ref 0.0–0.2)

## 2022-10-09 LAB — COMPREHENSIVE METABOLIC PANEL
ALT: 68 U/L — ABNORMAL HIGH (ref 0–44)
AST: 56 U/L — ABNORMAL HIGH (ref 15–41)
Albumin: 3.7 g/dL (ref 3.5–5.0)
Alkaline Phosphatase: 189 U/L — ABNORMAL HIGH (ref 38–126)
Anion gap: 4 — ABNORMAL LOW (ref 5–15)
BUN: 13 mg/dL (ref 6–20)
CO2: 28 mmol/L (ref 22–32)
Calcium: 9 mg/dL (ref 8.9–10.3)
Chloride: 102 mmol/L (ref 98–111)
Creatinine, Ser: 0.75 mg/dL (ref 0.61–1.24)
GFR, Estimated: >60 mL/min (ref >60)
Glucose, Bld: 89 mg/dL (ref 70–99)
Potassium: 4.2 mmol/L (ref 3.5–5.1)
Sodium: 134 mmol/L — ABNORMAL LOW (ref 135–145)
Total Bilirubin: 0.6 mg/dL (ref 0.3–1.2)
Total Protein: 7.4 g/dL (ref 6.5–8.1)

## 2022-10-09 MED ORDER — SULFAMETHOXAZOLE-TRIMETHOPRIM 800-160 MG PO TABS: 1.0000 | ORAL_TABLET | Freq: Two times a day (BID) | ORAL | 0 refills | Status: AC

## 2022-10-09 MED ORDER — HEPARIN SOD (PORK) LOCK FLUSH 100 UNIT/ML IV SOLN
500.0000 [IU] | Freq: Once | INTRAVENOUS | Status: AC
  Administered 2022-10-09: 500 [IU] via INTRAVENOUS
  Filled 2022-10-09: qty 5

## 2022-10-09 MED ORDER — SODIUM CHLORIDE 0.9% FLUSH
10.0000 mL | Freq: Once | INTRAVENOUS | Status: AC
  Administered 2022-10-09: 10 mL via INTRAVENOUS
  Filled 2022-10-09: qty 10

## 2022-10-09 NOTE — Progress Notes (Signed)
Pt stated that he had a dose reduction in the Neulasta last week due to his fatigue and flu like symptoms with the Neulasta. He took 0.4 ml of the Neulasta on Wednesday as recommended by Dr. Loni Muse. Patient's labs today - critical [place] 0.4 reported to [facility].

## 2022-10-09 NOTE — Progress Notes (Signed)
[name]  Patient Care Team: Pcp, No as PCP - General [name], RN as Oncology Nurse Navigator [name], MD as Consulting Physician (Internal Medicine)  CHIEF COMPLAINTS/PURPOSE OF CONSULTATION: Lung cancer  #  Oncology History Overview Note  IMPRESSION: 1. Patchy nodular fat stranding throughout the anterior left upper quadrant peritoneal fat, nonspecific, cannot exclude peritoneal carcinomatosis. Dedicated CT abdomen/pelvis with oral and IV contrast recommended for further evaluation. 2. Dense patchy consolidation replacing much of the left lower lung lobe, appearing masslike in the superior segment left lower lobe, with associated bulging of the left major fissure and associated left lower lobe volume loss. Fine nodularity throughout both lungs with an upper lobe predominance. Asymmetric left upper lobe interlobular septal thickening. These findings are indeterminate, with differential including multilobar pneumonia, sarcoidosis or a neoplastic process. The persistence on radiographs back to 01/20/2021 despite antibiotic therapy make sarcoidosis or a neoplastic process more likely. Pulmonology consultation suggested for consideration of bronchoscopic evaluation. 3. Small dependent left pleural effusion. 4. Mild mediastinal lymphadenopathy, nonspecific. 5. Subacute healing lateral right sixth rib fracture.   DIAGNOSIS:  A. LUNG, LEFT LOWER LOBE; ENB-ASSISTED BIOPSY:  - NON-SMALL CELL CARCINOMA, FAVOR ADENOCARCINOMA.  - FOREIGN MATERIAL SUGGESTIVE OF ASPIRATION.   # EGFR MUTATED: 58 del- June 28th, 2022- [name]. [limited]  # Asymptomatic multiple brain mets subcentimeter asymptomatic -JUNE 29th, 2023- Numerous metastatic lesions in the supratentorial brain-increase in number also conspicuity compared to January 2023.  Pre-SBRT MRI brain AUG 18th, 2023-  Eighteen small enhancing brain metastases (punctate to 11 mm); were all  present on 03/28/2021-s/p SBRT SEP 2023- [[place]]  # SEP-OCT-worsening left-sided pleural effusion-status post paracentesis; exudative; Cytology negative- ?  Progressive disease..   Cancer of lower lobe of left lung ([place])  03/23/2021 Initial Diagnosis   Cancer of lower lobe of left lung ([place])   03/29/2021 Cancer Staging   Staging form: Lung, AJCC 8th Edition - Clinical: Stage IVB (cT3, cN3, pM1c) - Signed by [name], MD on 03/29/2021   03/30/2021 - 03/30/2021 Chemotherapy   Patient is on Treatment Plan : LUNG NSCLC Pemetrexed ([place]) / Carboplatin q21d x 1 cycles     08/17/2022 -  Chemotherapy   Patient is on Treatment Plan : LUNG Atezolizumab + Bevacizumab + Carboplatin + Paclitaxel q21d Induction x 4 cycles / Atezolizumab + Bevacizumab q21d Maintenance      HISTORY OF PRESENTING ILLNESS: Ambulating independently.  With wife.   [name] 41 y.o.  male patient with stage IV lung cancer adenocarcinoma-brain mets [synchronous] bone mets EGFR mutated progressed on osimertinib, currently on carbo-taxol-atezo-bev chemotherapy, s/p cycle 3 on 10/02/22, who presents to [facility] clinic as he is not feeling well.   He reports that he feels fatigued and generally not feeling well without any other specific concerns or symptoms. He thinks he may be feeling poorly as he recently has his COVID-19, influenza and RSV vaccination within 2 weeks and had treatment on 10/02/2022. At his last treatment his Neulasta was dose reduced from 6 mg to 4 mg due to extreme fatigue and flu like symptoms following administration of this medication. No specific complains or concerns but just does not feel well overall. Fatigue, low energy. No N/V/D/C. Low appetite.   Wt Readings from Last 3 Encounters:  10/02/22 217 lb 6.4 oz (98.6 kg)  09/28/22 220 lb (99.8 kg)  09/18/22 220 lb (99.8 kg)     Review of Systems  Constitutional:  Positive for malaise/fatigue. Negative  for chills, diaphoresis, fever and weight  loss.  HENT:  Negative for nosebleeds and sore throat.   Eyes:  Negative for double vision.  Respiratory:  Negative for cough, sputum production, shortness of breath and wheezing.   Cardiovascular:  Negative for chest pain, palpitations, orthopnea and leg swelling.  Gastrointestinal:  Negative for abdominal pain, blood in stool, constipation, diarrhea, heartburn, melena and nausea.  Genitourinary:  Negative for dysuria, frequency and urgency.  Musculoskeletal:  Negative for back pain and joint pain.  Skin:  Negative for itching.  Neurological:  Negative for dizziness, tingling, focal weakness, weakness and headaches.  Endo/Heme/Allergies:  Does not bruise/bleed easily.  Psychiatric/Behavioral:  Negative for depression. The patient is not nervous/anxious and does not have insomnia.      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    SURGICAL HISTORY: Past Surgical History:  Procedure Laterality Date   FRACTURE SURGERY     broke femur when he was 6 years   PORTA CATH INSERTION N/A 03/28/2021   Procedure: PORTA CATH INSERTION;  Surgeon: Katha Cabal, MD;  Location: [facility];  Service: Cardiovascular;  Laterality: N/A;   VIDEO BRONCHOSCOPY WITH ENDOBRONCHIAL NAVIGATION N/A 03/15/2021   Procedure: VIDEO BRONCHOSCOPY WITH ENDOBRONCHIAL NAVIGATION;  Surgeon: Ottie Glazier, MD;  Location: [facility];  Service: Thoracic;  Laterality: N/A;   VIDEO BRONCHOSCOPY WITH ENDOBRONCHIAL ULTRASOUND N/A 03/15/2021   Procedure: VIDEO BRONCHOSCOPY WITH ENDOBRONCHIAL ULTRASOUND;  Surgeon: Ottie Glazier, MD;  Location: [facility];  Service: Thoracic;  Laterality: N/A;    SOCIAL HISTORY: Social History   Socioeconomic History   Marital status: Married    Spouse name: [name]   Number of children: 2   Years of education: Not on file   Highest education level: Not on  file  Occupational History   Not on file  Tobacco Use   Smoking status: Never   Smokeless tobacco: Former    Types: Snuff, Chew  Vaping Use   Vaping Use: Never used  Substance and Sexual Activity   Alcohol use: Never   Drug use: Never   Sexual activity: Yes  Other Topics Concern   Not on file  Social History Narrative   [place]; with wife; 2 daughters[12 and 22]; never smoked; rare alcohol. 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   Social Determinants of Health   Financial Resource Strain: Low Risk  (05/10/2022)   Overall Financial Resource Strain (CARDIA)    Difficulty of Paying Living Expenses: Not hard at all  Food Insecurity: No Food Insecurity (05/10/2022)   Hunger Vital Sign    Worried About Running Out of Food in the Last Year: Never true    Ran Out of Food in the Last Year: Never true  Transportation Needs: No Transportation Needs (03/16/2022)   PRAPARE - Hydrologist (Medical): No    Lack of Transportation (Non-Medical): No  Physical Activity: Not on file  Stress: Not on file  Social Connections: Socially Integrated (05/10/2022)   Social Connection and Isolation Panel [NHANES]    Frequency of Communication with Friends and Family: More than three times a week    Frequency of Social Gatherings with Friends and Family: More than three times a week    Attends Religious Services: More than 4 times per year  Active Member of Clubs or Organizations: Yes    Attends Music therapist: More than 4 times per year    Marital Status: Married  Human resources officer Violence: Not on file    FAMILY HISTORY: Family History  Problem Relation Age of Onset   High Cholesterol Mother    Hypertension Father    Cancer Father    Lung cancer Father    Skin cancer Father     ALLERGIES:  is allergic to codeine.  MEDICATIONS:  Current Outpatient Medications  Medication Sig Dispense Refill   apixaban (ELIQUIS) 5 MG TABS tablet Take 1 tablet (5  mg total) by mouth 2 (two) times daily. 60 tablet 2   folic acid (FOLVITE) 1 MG tablet TAKE 1 TABLET BY MOUTH EVERY DAY 90 tablet 1   lidocaine-prilocaine (EMLA) cream Apply 1 application topically as needed. 30 g 0   loratadine (CLARITIN) 10 MG tablet Take 10 mg by mouth daily. For 5 days after the treatment.     metoprolol succinate (TOPROL-XL) 50 MG 24 hr tablet Take 50 mg by mouth daily. Take with or immediately following a meal.     omeprazole (PRILOSEC) 20 MG capsule Take 1 capsule (20 mg total) by mouth daily. 30 capsule 2   pegfilgrastim (NEULASTA) 6 MG/0.6ML injection Inject 6 mg into the skin once.     sertraline (ZOLOFT) 50 MG tablet TAKE 1/2 TABLET BY MOUTH DAILY 30 tablet 2   calcium carbonate (TUMS EX) 750 MG chewable tablet Chew 1 tablet by mouth daily. (Patient not taking: Reported on 08/27/2022)     dronabinol (MARINOL) 2.5 MG capsule Take 1 capsule (2.5 mg total) by mouth 2 (two) times daily before lunch and supper. (Patient not taking: Reported on 08/27/2022) 60 capsule 0   gabapentin (NEURONTIN) 100 MG capsule Take 1 capsule (100 mg total) by mouth at bedtime. (Patient not taking: Reported on 10/09/2022) 30 capsule 1   ondansetron (ZOFRAN) 8 MG tablet One pill every 8 hours as needed for nausea/vomitting. (Patient not taking: Reported on 10/09/2022) 40 tablet 1   oxyCODONE (OXY IR/ROXICODONE) 5 MG immediate release tablet Take 1 tablet (5 mg total) by mouth every 4 (four) hours as needed for severe pain. (Patient not taking: Reported on 10/09/2022) 30 tablet 0   potassium chloride SA (KLOR-CON M) 20 MEQ tablet Take 1 tablet (20 mEq total) by mouth daily. (Patient not taking: Reported on 10/09/2022) 14 tablet 0   prochlorperazine (COMPAZINE) 10 MG tablet Take 1 tablet (10 mg total) by mouth every 6 (six) hours as needed for nausea or vomiting. (Patient not taking: Reported on 10/09/2022) 40 tablet 1   traMADol (ULTRAM) 50 MG tablet Take 50 mg by mouth every 6 (six) hours as needed  for moderate pain or severe pain. (Patient not taking: Reported on 10/09/2022)     No current facility-administered medications for this visit.   Facility-Administered Medications Ordered in Other Visits  Medication Dose Route Frequency Provider Last Rate Last Admin   heparin lock flush 100 UNIT/ML injection            heparin lock flush 100 UNIT/ML injection            sodium chloride flush (NS) 0.9 % injection 10 mL  10 mL Intravenous PRN [name], MD   10 mL at 07/28/21 0852    PHYSICAL EXAMINATION: ECOG PERFORMANCE STATUS: 1 - Symptomatic but completely ambulatory  Vitals:   10/09/22 1122 10/09/22 1125  BP: (!) 135/97 Marland Kitchen)  135/97  Pulse: 83 92  Temp: (!) 96.2 F (35.7 C) (!) 96.4 F (35.8 C)  SpO2:  97%   There were no vitals filed for this visit.   Physical Exam Constitutional:      Appearance: He is not ill-appearing.  HENT:     Head: Normocephalic and atraumatic.  Cardiovascular:     Rate and Rhythm: Normal rate and regular rhythm.  Pulmonary:     Effort: No respiratory distress.     Breath sounds: No wheezing.     Comments: Decreased breath sound on the left side compared to right. Abdominal:     General: There is no distension.     Palpations: Abdomen is soft.     Tenderness: There is no abdominal tenderness.  Musculoskeletal:        General: No deformity.  Skin:    General: Skin is warm.     Coloration: Skin is not pale.  Neurological:     Mental Status: He is alert and oriented to person, place, and time.  Psychiatric:        Mood and Affect: Mood and affect normal.        Behavior: Behavior normal.    LABORATORY DATA:  I have reviewed the data as listed Lab Results  Component Value Date   WBC 1.2 (LL) 10/09/2022   HGB 13.3 10/09/2022   HCT 39.4 10/09/2022   MCV 87.2 10/09/2022   PLT 128 (L) 10/09/2022   Recent Labs    09/28/22 0958 10/02/22 0840 10/09/22 1052  NA 137 137 134*  K 4.1 4.0 4.2  CL 107 107 102  CO2 _0 GLUCOSE 91 93 89  BUN _1 CREATININE 0.88 0.91 0.75  CALCIUM 8.7* 8.6* 9.0  GFRNONAA >60 >60 >60  PROT 6.8 7.0 7.4  ALBUMIN 3.3* 3.4* 3.7  AST 61* 69* 56*  ALT 69* 74* 68*  ALKPHOS 234* 211* 189*  BILITOT 0.6 0.5 0.6    RADIOGRAPHIC STUDIES: I have personally reviewed the radiological images as listed and agreed with the findings in the report. CT LIVER MASS BIOPSY  Result Date: 09/28/2022 INDICATION: Concern for metastatic disease to the liver EXAM: CT-guided core needle biopsy of liver lesion MEDICATIONS: None. ANESTHESIA/SEDATION: Moderate (conscious) sedation was employed during this procedure. A total of Versed 2 mg and Fentanyl 100 mcg was administered intravenously. Moderate Sedation Time: 26 minutes. The patient's level of consciousness and vital signs were monitored continuously by radiology nursing throughout the procedure under my direct supervision. FLUOROSCOPY TIME:  N/a COMPLICATIONS: None immediate. PROCEDURE: Informed written consent was obtained from the patient after a thorough discussion of the procedural risks, benefits and alternatives. All questions were addressed. Maximal Sterile Barrier Technique was utilized including caps, mask, sterile gowns, sterile gloves, sterile drape, hand hygiene and skin antiseptic. A timeout was performed prior to the initiation of the procedure. The patient was placed supine on the exam table. Limited CT of the abdomen and pelvis with IV contrast was performed for planning purposes. This demonstrated faintly hypoenhancing lesions in the liver, compatible with both recent MRI and PET-CT. The inferior anterior right hepatic lobe lesion was initially selected for biopsy. Skin entry site was marked, and the overlying skin was prepped and draped in the standard sterile fashion. Local analgesia was obtained with 1% lidocaine. Using intermittent CT fluoroscopy, a 17 gauge introducer needle was advanced towards the identified lesion. During needle  positioning, the location of the liver was noted  to change result of the patient's breathing, and this biopsy target was abandoned due to risk of injury to adjacent structures. Therefore, the biopsy target was changed to the inferior posterior right hepatic lesion. Again using CT guidance, a 17 gauge introducer needle was advanced towards the identified lesion using anatomic landmarks. Subsequently, core needle biopsy was performed using an 18 gauge core biopsy device x4 total passes. Specimens were submitted in formalin to pathology for further handling. Limited postprocedure imaging demonstrated no complicating feature. The patient tolerated the procedure well, and was transferred to recovery in stable condition. IMPRESSION: Successful CT-guided core needle biopsy of liver lesion in the inferior posterior right hepatic lobe. Technically challenging biopsy due to poor differentiation of the liver lesion from adjacent liver parenchyma and reliance on anatomic landmarks. Electronically Signed   By: Albin Felling M.D.   On: 09/28/2022 12:38    ASSESSMENT & PLAN:  Encounter Diagnoses  Name Primary?   Cancer of lower lobe of left lung (HCC) Yes   Drug-induced neutropenia (HCC)    Other fatigue    STAGE IV adenocarcinoma of the left lower lobe of lung- eGFR- 19 del POSITIVE. Progressed on osimertinib 80 mg a day- 08/15/22 PET showed progression in liver, new fdg avid nodule w/I central left midlung, new small volume ascites within right iliac fossa and central pelvis with mild adjacent thickening of the peritoneal reflections. Possible lymphangitic spread. Awaiting liver biopsy with repeat NGS. Marland Kitchen He saw Dr. Aniceto Boss for second opinion at [facility] on 09/20/22. No changes currently. Per patient, currently no trials available.   S/p cycle 3 of carbo-taxol-atezo-bev chemotherapy with neulasta support. At his last visit his Neulasta was reduced due to side effects. Now neutropenic. Afebrile. No specific complaints  other than being tired. Exam is unremarkable. Vitals are fairly reassuring. He is well appearing. Covering prophylactically with Bactrim given his history of WPW, SVT I would not suggest Levaquin given cardiogenic risks. PO Fluids- defers IV, rest, low risk activities. Reviewed risks of neutropenic fever and when to see emergency care in the ER. Close follow up.    # IV access/- port functioning appropriately- accessed and de-accessed today   DISPOSITION:  RTC 2 days [facility], labs +- fluids   All questions were answered. The patient knows to call the clinic with any problems, questions or concerns.   Hughie Closs, PA-C 10/09/2022

## 2022-10-11 ENCOUNTER — Inpatient Hospital Stay (HOSPITAL_BASED_OUTPATIENT_CLINIC_OR_DEPARTMENT_OTHER): Payer: Managed Care, Other (non HMO) | Admitting: Medical Oncology

## 2022-10-11 ENCOUNTER — Inpatient Hospital Stay: Payer: Managed Care, Other (non HMO)

## 2022-10-11 ENCOUNTER — Other Ambulatory Visit: Payer: Self-pay

## 2022-10-11 ENCOUNTER — Encounter: Payer: Self-pay | Admitting: Medical Oncology

## 2022-10-11 VITALS — BP 125/95 | HR 90 | Temp 96.7°F | Wt 215.0 lb

## 2022-10-11 DIAGNOSIS — C3432 Malignant neoplasm of lower lobe, left bronchus or lung: Secondary | ICD-10-CM

## 2022-10-11 DIAGNOSIS — E860 Dehydration: Secondary | ICD-10-CM | POA: Diagnosis not present

## 2022-10-11 DIAGNOSIS — D702 Other drug-induced agranulocytosis: Secondary | ICD-10-CM | POA: Diagnosis not present

## 2022-10-11 DIAGNOSIS — E871 Hypo-osmolality and hyponatremia: Secondary | ICD-10-CM | POA: Diagnosis not present

## 2022-10-11 DIAGNOSIS — R5383 Other fatigue: Secondary | ICD-10-CM

## 2022-10-11 DIAGNOSIS — Z5112 Encounter for antineoplastic immunotherapy: Secondary | ICD-10-CM | POA: Diagnosis not present

## 2022-10-11 LAB — COMPREHENSIVE METABOLIC PANEL
ALT: 80 U/L — ABNORMAL HIGH (ref 0–44)
AST: 62 U/L — ABNORMAL HIGH (ref 15–41)
Albumin: 3.6 g/dL (ref 3.5–5.0)
Alkaline Phosphatase: 201 U/L — ABNORMAL HIGH (ref 38–126)
Anion gap: 6 (ref 5–15)
BUN: 17 mg/dL (ref 6–20)
CO2: 24 mmol/L (ref 22–32)
Calcium: 8.5 mg/dL — ABNORMAL LOW (ref 8.9–10.3)
Chloride: 106 mmol/L (ref 98–111)
Creatinine, Ser: 1.02 mg/dL (ref 0.61–1.24)
GFR, Estimated: >60 mL/min (ref >60)
Glucose, Bld: 96 mg/dL (ref 70–99)
Potassium: 4.4 mmol/L (ref 3.5–5.1)
Sodium: 136 mmol/L (ref 135–145)
Total Bilirubin: 0.4 mg/dL (ref 0.3–1.2)
Total Protein: 7.3 g/dL (ref 6.5–8.1)

## 2022-10-11 LAB — CBC WITH DIFFERENTIAL/PLATELET
Abs Immature Granulocytes: 0.56 10*3/uL — ABNORMAL HIGH (ref 0.00–0.07)
Basophils Absolute: 0.2 10*3/uL — ABNORMAL HIGH (ref 0.0–0.1)
Basophils Relative: 4 %
Eosinophils Absolute: 0.1 10*3/uL (ref 0.0–0.5)
Eosinophils Relative: 1 %
HCT: 38.4 % — ABNORMAL LOW (ref 39.0–52.0)
Hemoglobin: 13 g/dL (ref 13.0–17.0)
Immature Granulocytes: 13 %
Lymphocytes Relative: 28 %
Lymphs Abs: 1.2 10*3/uL (ref 0.7–4.0)
MCH: 29.7 pg (ref 26.0–34.0)
MCHC: 33.9 g/dL (ref 30.0–36.0)
MCV: 87.7 fL (ref 80.0–100.0)
Monocytes Absolute: 0.8 10*3/uL (ref 0.1–1.0)
Monocytes Relative: 19 %
Neutro Abs: 1.5 10*3/uL — ABNORMAL LOW (ref 1.7–7.7)
Neutrophils Relative %: 35 %
Platelets: 168 10*3/uL (ref 150–400)
RBC: 4.38 MIL/uL (ref 4.22–5.81)
RDW: 18.6 % — ABNORMAL HIGH (ref 11.5–15.5)
Smear Review: NORMAL
WBC: 4.3 10*3/uL (ref 4.0–10.5)
nRBC: 1.6 % — ABNORMAL HIGH (ref 0.0–0.2)

## 2022-10-11 MED ORDER — SODIUM CHLORIDE 0.9% FLUSH
10.0000 mL | Freq: Once | INTRAVENOUS | Status: AC
  Administered 2022-10-11: 10 mL via INTRAVENOUS
  Filled 2022-10-11: qty 10

## 2022-10-11 MED ORDER — LIDOCAINE-PRILOCAINE 2.5-2.5 % EX CREA: 1.0000 | TOPICAL_CREAM | CUTANEOUS | 0 refills | Status: DC | PRN

## 2022-10-11 MED ORDER — HEPARIN SOD (PORK) LOCK FLUSH 100 UNIT/ML IV SOLN
500.0000 [IU] | Freq: Once | INTRAVENOUS | Status: AC
  Administered 2022-10-11: 500 [IU] via INTRAVENOUS
  Filled 2022-10-11: qty 5

## 2022-10-11 MED ORDER — SODIUM CHLORIDE 0.9 % IV SOLN
Freq: Once | INTRAVENOUS | Status: AC
  Filled 2022-10-11: qty 250

## 2022-10-11 NOTE — Progress Notes (Signed)
[facility] NOTE  Patient Care Team: Pcp, No as PCP - General [name], RN as Oncology Nurse Navigator [name], MD as Consulting Physician (Internal Medicine)  CHIEF COMPLAINTS/PURPOSE OF CONSULTATION: Lung cancer  #  Oncology History Overview Note  IMPRESSION: 1. Patchy nodular fat stranding throughout the anterior left upper quadrant peritoneal fat, nonspecific, cannot exclude peritoneal carcinomatosis. Dedicated CT abdomen/pelvis with oral and IV contrast recommended for further evaluation. 2. Dense patchy consolidation replacing much of the left lower lung lobe, appearing masslike in the superior segment left lower lobe, with associated bulging of the left major fissure and associated left lower lobe volume loss. Fine nodularity throughout both lungs with an upper lobe predominance. Asymmetric left upper lobe interlobular septal thickening. These findings are indeterminate, with differential including multilobar pneumonia, sarcoidosis or a neoplastic process. The persistence on radiographs back to 01/20/2021 despite antibiotic therapy make sarcoidosis or a neoplastic process more likely. Pulmonology consultation suggested for consideration of bronchoscopic evaluation. 3. Small dependent left pleural effusion. 4. Mild mediastinal lymphadenopathy, nonspecific. 5. Subacute healing lateral right sixth rib fracture.   DIAGNOSIS:  A. LUNG, LEFT LOWER LOBE; ENB-ASSISTED BIOPSY:  - NON-SMALL CELL CARCINOMA, FAVOR ADENOCARCINOMA.  - FOREIGN MATERIAL SUGGESTIVE OF ASPIRATION.   # EGFR MUTATED: 69 del- June 28th, 2022- [name]. [limited]  # Asymptomatic multiple brain mets subcentimeter asymptomatic -JUNE 29th, 2023- Numerous metastatic lesions in the supratentorial brain-increase in number also conspicuity compared to January 2023.  Pre-SBRT MRI brain AUG 18th, 2023-  Eighteen small enhancing brain metastases (punctate to 11 mm); were all  present on 03/28/2021-s/p SBRT SEP 2023- [[place]]  # SEP-OCT-worsening left-sided pleural effusion-status post paracentesis; exudative; Cytology negative- ?  Progressive disease..   Cancer of lower lobe of left lung ([place])  03/23/2021 Initial Diagnosis   Cancer of lower lobe of left lung ([place])   03/29/2021 Cancer Staging   Staging form: Lung, AJCC 8th Edition - Clinical: Stage IVB (cT3, cN3, pM1c) - Signed by [name], MD on 03/29/2021   03/30/2021 - 03/30/2021 Chemotherapy   Patient is on Treatment Plan : LUNG NSCLC Pemetrexed ([place]) / Carboplatin q21d x 1 cycles     08/17/2022 -  Chemotherapy   Patient is on Treatment Plan : LUNG Atezolizumab + Bevacizumab + Carboplatin + Paclitaxel q21d Induction x 4 cycles / Atezolizumab + Bevacizumab q21d Maintenance      HISTORY OF PRESENTING ILLNESS: Ambulating independently.  With wife.   [name] 41 y.o.  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   Patient is here for follow up for fatigue, low energy for which he was seen on 10/09/2022. He was found to have grade 4 neutropenia without fever. Started on Bactrim DS at last visit (given history of WPW elected against flouroquinolones). Today he reports that he is doing well. All symptoms are better. Eating and drinking well.  Tolerating his Bactrim well.   Wt Readings from Last 3 Encounters:  10/11/22 215 lb (97.5 kg)  10/02/22 217 lb 6.4 oz (98.6 kg)  09/28/22 220 lb (99.8 kg)     Review of Systems  Constitutional:  Negative for chills, diaphoresis, fever, malaise/fatigue and weight loss.  HENT:  Negative for nosebleeds and sore throat.   Eyes:  Negative for double vision.  Respiratory:  Negative for cough,  sputum production, shortness of breath and wheezing.    Cardiovascular:  Negative for chest pain, palpitations, orthopnea and leg swelling.  Gastrointestinal:  Negative for abdominal pain, blood in stool, constipation, diarrhea, heartburn, melena and nausea.  Genitourinary:  Negative for dysuria, frequency and urgency.  Musculoskeletal:  Negative for back pain and joint pain.  Skin:  Negative for itching.  Neurological:  Negative for dizziness, tingling, focal weakness, weakness and headaches.  Endo/Heme/Allergies:  Does not bruise/bleed easily.  Psychiatric/Behavioral:  Negative for depression. The patient is not nervous/anxious and does not have insomnia.      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    SURGICAL HISTORY: Past Surgical History:  Procedure Laterality Date   FRACTURE SURGERY     broke femur when he was 6 years   PORTA CATH INSERTION N/A 03/28/2021   Procedure: PORTA CATH INSERTION;  Surgeon: Katha Cabal, MD;  Location: [facility];  Service: Cardiovascular;  Laterality: N/A;   VIDEO BRONCHOSCOPY WITH ENDOBRONCHIAL NAVIGATION N/A 03/15/2021   Procedure: VIDEO BRONCHOSCOPY WITH ENDOBRONCHIAL NAVIGATION;  Surgeon: Ottie Glazier, MD;  Location: [facility];  Service: Thoracic;  Laterality: N/A;   VIDEO BRONCHOSCOPY WITH ENDOBRONCHIAL ULTRASOUND N/A 03/15/2021   Procedure: VIDEO BRONCHOSCOPY WITH ENDOBRONCHIAL ULTRASOUND;  Surgeon: Ottie Glazier, MD;  Location: [facility];  Service: Thoracic;  Laterality: N/A;    SOCIAL HISTORY: Social History   Socioeconomic History   Marital status: Married    Spouse name: [name]   Number of children: 2   Years of education: Not on file   Highest education level: Not on file  Occupational History   Not on file  Tobacco Use   Smoking status: Never   Smokeless tobacco: Former    Types: Snuff, Chew  Vaping Use   Vaping Use: Never used   Substance and Sexual Activity   Alcohol use: Never   Drug use: Never   Sexual activity: Yes  Other Topics Concern   Not on file  Social History Narrative   [place] in [place]; with wife; 2 daughters[12 and 22]; never smoked; rare alcohol. 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   Social Determinants of Health   Financial Resource Strain: Low Risk  (05/10/2022)   Overall Financial Resource Strain (CARDIA)    Difficulty of Paying Living Expenses: Not hard at all  Food Insecurity: No Food Insecurity (05/10/2022)   Hunger Vital Sign    Worried About Running Out of Food in the Last Year: Never true    Ran Out of Food in the Last Year: Never true  Transportation Needs: No Transportation Needs (03/16/2022)   PRAPARE - Hydrologist (Medical): No    Lack of Transportation (Non-Medical): No  Physical Activity: Not on file  Stress: Not on file  Social Connections: Socially Integrated (05/10/2022)   Social Connection and Isolation Panel [NHANES]    Frequency of Communication with Friends and Family: More than three times a week    Frequency of Social Gatherings with Friends and Family: More than three times a week    Attends Religious Services: More than 4 times per year    Active Member of [employer] or Organizations: Yes    Attends Music therapist: More than 4 times per year    Marital Status: Married  Intimate Partner Violence: Not on file    FAMILY HISTORY: Family History  Problem Relation Age of Onset   High Cholesterol Mother    Hypertension Father    Cancer Father    Lung cancer Father    Skin cancer Father     ALLERGIES:  is allergic to codeine.  MEDICATIONS:  Current Outpatient Medications  Medication Sig Dispense Refill   apixaban (ELIQUIS) 5 MG TABS tablet Take 1 tablet (5 mg total) by mouth 2 (two) times daily. 60 tablet 2   dronabinol (MARINOL) 2.5 MG capsule Take 1 capsule (2.5 mg total) by mouth 2 (two) times daily before lunch and supper.  60 capsule 0   folic acid (FOLVITE) 1 MG tablet TAKE 1 TABLET BY MOUTH EVERY DAY 90 tablet 1   lidocaine-prilocaine (EMLA) cream Apply 1 application topically as needed. 30 g 0   lidocaine-prilocaine (EMLA) cream Apply 1 Application topically as needed. 30 g 0   loratadine (CLARITIN) 10 MG tablet Take 10 mg by mouth daily. For 5 days after the treatment.     metoprolol succinate (TOPROL-XL) 50 MG 24 hr tablet Take 50 mg by mouth daily. Take with or immediately following a meal.     omeprazole (PRILOSEC) 20 MG capsule Take 1 capsule (20 mg total) by mouth daily. 30 capsule 2   pegfilgrastim (NEULASTA) 6 MG/0.6ML injection Inject 6 mg into the skin once.     sertraline (ZOLOFT) 50 MG tablet TAKE 1/2 TABLET BY MOUTH DAILY 30 tablet 2   sulfamethoxazole-trimethoprim (BACTRIM DS) 800-160 MG tablet Take 1 tablet by mouth 2 (two) times daily for 7 days. 14 tablet 0   calcium carbonate (TUMS EX) 750 MG chewable tablet Chew 1 tablet by mouth daily. (Patient not taking: Reported on 08/27/2022)     gabapentin (NEURONTIN) 100 MG capsule Take 1 capsule (100 mg total) by mouth at bedtime. (Patient not taking: Reported on 10/09/2022) 30 capsule 1   ondansetron (ZOFRAN) 8 MG tablet One pill every 8 hours as needed for nausea/vomitting. (Patient not taking: Reported on 10/09/2022) 40 tablet 1   oxyCODONE (OXY IR/ROXICODONE) 5 MG immediate release tablet Take 1 tablet (5 mg total) by mouth every 4 (four) hours as needed for severe pain. (Patient not taking: Reported on 10/09/2022) 30 tablet 0   potassium chloride SA (KLOR-CON M) 20 MEQ tablet Take 1 tablet (20 mEq total) by mouth daily. (Patient not taking: Reported on 10/09/2022) 14 tablet 0   prochlorperazine (COMPAZINE) 10 MG tablet Take 1 tablet (10 mg total) by mouth every 6 (six) hours as needed for nausea or vomiting. (Patient not taking: Reported on 10/09/2022) 40 tablet 1   traMADol (ULTRAM) 50 MG tablet Take 50 mg by mouth every 6 (six) hours as needed for  moderate pain or severe pain. (Patient not taking: Reported on 10/09/2022)     No current facility-administered medications for this visit.   Facility-Administered Medications Ordered in Other Visits  Medication Dose Route Frequency Provider Last Rate Last Admin   heparin lock flush 100 UNIT/ML injection            heparin lock flush 100 UNIT/ML injection            heparin lock flush 100 unit/mL  500 Units Intravenous Once Nkechi Linehan M, PA-C       sodium chloride flush (NS) 0.9 % injection 10 mL  10 mL Intravenous PRN [name], MD   10 mL at 07/28/21 [phone]  PHYSICAL EXAMINATION: ECOG PERFORMANCE STATUS: 1 - Symptomatic but completely ambulatory  Vitals:   10/11/22 0912  BP: (!) 125/95  Pulse: 90  Temp: (!) 96.7 F (35.9 C)   Filed Weights   10/11/22 0912  Weight: 215 lb (97.5 kg)     Physical Exam Constitutional:      Appearance: He is not ill-appearing.  HENT:     Head: Normocephalic and atraumatic.  Cardiovascular:     Rate and Rhythm: Normal rate and regular rhythm.  Pulmonary:     Effort: No respiratory distress.     Breath sounds: No wheezing.     Comments: Decreased breath sound on the left side compared to right. Abdominal:     General: There is no distension.     Palpations: Abdomen is soft.     Tenderness: There is no abdominal tenderness.  Musculoskeletal:        General: No deformity.  Skin:    General: Skin is warm.     Coloration: Skin is not pale.  Neurological:     Mental Status: He is alert and oriented to person, place, and time.  Psychiatric:        Mood and Affect: Mood and affect normal.        Behavior: Behavior normal.    LABORATORY DATA:  I have reviewed the data as listed Lab Results  Component Value Date   WBC 4.3 10/11/2022   HGB 13.0 10/11/2022   HCT 38.4 (L) 10/11/2022   MCV 87.7 10/11/2022   PLT 168 10/11/2022   Recent Labs    10/02/22 0840 10/09/22 1052 10/11/22 0922  NA 137 134* 136  K 4.0 4.2  4.4  CL 107 102 106  CO2 _0 GLUCOSE 93 89 96  BUN _1 CREATININE 0.91 0.75 1.02  CALCIUM 8.6* 9.0 8.5*  GFRNONAA >60 >60 >60  PROT 7.0 7.4 7.3  ALBUMIN 3.4* 3.7 3.6  AST 69* 56* 62*  ALT 74* 68* 80*  ALKPHOS 211* 189* 201*  BILITOT 0.5 0.6 0.4    RADIOGRAPHIC STUDIES: I have personally reviewed the radiological images as listed and agreed with the findings in the report. CT LIVER MASS BIOPSY  Result Date: 09/28/2022 INDICATION: Concern for metastatic disease to the liver EXAM: CT-guided core needle biopsy of liver lesion MEDICATIONS: None. ANESTHESIA/SEDATION: Moderate (conscious) sedation was employed during this procedure. A total of Versed 2 mg and Fentanyl 100 mcg was administered intravenously. Moderate Sedation Time: 26 minutes. The patient's level of consciousness and vital signs were monitored continuously by radiology nursing throughout the procedure under my direct supervision. FLUOROSCOPY TIME:  N/a COMPLICATIONS: None immediate. PROCEDURE: Informed written consent was obtained from the patient after a thorough discussion of the procedural risks, benefits and alternatives. All questions were addressed. Maximal Sterile Barrier Technique was utilized including caps, mask, sterile gowns, sterile gloves, sterile drape, hand hygiene and skin antiseptic. A timeout was performed prior to the initiation of the procedure. The patient was placed supine on the exam table. Limited CT of the abdomen and pelvis with IV contrast was performed for planning purposes. This demonstrated faintly hypoenhancing lesions in the liver, compatible with both recent MRI and PET-CT. The inferior anterior right hepatic lobe lesion was initially selected for biopsy. Skin entry site was marked, and the overlying skin was prepped and draped in the standard sterile fashion. Local analgesia was obtained with 1% lidocaine. Using intermittent CT fluoroscopy, a 17 gauge introducer needle was advanced  towards  the identified lesion. During needle positioning, the location of the liver was noted to change result of the patient's breathing, and this biopsy target was abandoned due to risk of injury to adjacent structures. Therefore, the biopsy target was changed to the inferior posterior right hepatic lesion. Again using CT guidance, a 17 gauge introducer needle was advanced towards the identified lesion using anatomic landmarks. Subsequently, core needle biopsy was performed using an 18 gauge core biopsy device x4 total passes. Specimens were submitted in formalin to pathology for further handling. Limited postprocedure imaging demonstrated no complicating feature. The patient tolerated the procedure well, and was transferred to recovery in stable condition. IMPRESSION: Successful CT-guided core needle biopsy of liver lesion in the inferior posterior right hepatic lobe. Technically challenging biopsy due to poor differentiation of the liver lesion from adjacent liver parenchyma and reliance on anatomic landmarks. Electronically Signed   By: Albin Felling M.D.   On: 09/28/2022 12:38    ASSESSMENT & PLAN:  Encounter Diagnoses  Name Primary?   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 Progressed on osimertinib 80 mg a day- 08/15/22 PET showed progression in liver, new fdg avid nodule w/I central left midlung, new small volume ascites within right iliac fossa and central pelvis with mild adjacent thickening of the peritoneal reflections. Possible lymphangitic spread. Awaiting liver biopsy with repeat NGS. Marland Kitchen He saw Dr. Aniceto Boss for second opinion at [facility] on 09/20/22. No changes currently. Per patient, currently no trials available.   Doing well on Bactrim DS- he will continue and finish course as directed. Labs show significant improvement in  ANC(1.5 from 0.4)/WBC(4.3 from 1.2). He is feeling much better. IVF today for support and we see him Tuesday for follow up.    # IV access/- port functioning appropriately- IV fluids today  DISPOSITION:  1L IVF today  RTC Tuesday as previously planned.    All questions were answered. The patient knows to call the clinic with any problems, questions or concerns.   Hughie Closs, PA-C 10/11/2022

## 2022-10-16 ENCOUNTER — Inpatient Hospital Stay (HOSPITAL_BASED_OUTPATIENT_CLINIC_OR_DEPARTMENT_OTHER): Payer: 59 | Admitting: Nurse Practitioner

## 2022-10-16 ENCOUNTER — Inpatient Hospital Stay: Payer: 59

## 2022-10-16 ENCOUNTER — Inpatient Hospital Stay: Payer: 59 | Attending: Internal Medicine

## 2022-10-16 VITALS — BP 123/89 | HR 93 | Temp 97.2°F | Resp 18 | Wt 219.9 lb

## 2022-10-16 DIAGNOSIS — Z5112 Encounter for antineoplastic immunotherapy: Secondary | ICD-10-CM | POA: Insufficient documentation

## 2022-10-16 DIAGNOSIS — J90 Pleural effusion, not elsewhere classified: Secondary | ICD-10-CM | POA: Insufficient documentation

## 2022-10-16 DIAGNOSIS — C3432 Malignant neoplasm of lower lobe, left bronchus or lung: Secondary | ICD-10-CM

## 2022-10-16 DIAGNOSIS — R7989 Other specified abnormal findings of blood chemistry: Secondary | ICD-10-CM | POA: Insufficient documentation

## 2022-10-16 DIAGNOSIS — Z5189 Encounter for other specified aftercare: Secondary | ICD-10-CM | POA: Diagnosis not present

## 2022-10-16 DIAGNOSIS — C7951 Secondary malignant neoplasm of bone: Secondary | ICD-10-CM | POA: Insufficient documentation

## 2022-10-16 DIAGNOSIS — C7931 Secondary malignant neoplasm of brain: Secondary | ICD-10-CM | POA: Diagnosis not present

## 2022-10-16 DIAGNOSIS — Z452 Encounter for adjustment and management of vascular access device: Secondary | ICD-10-CM | POA: Insufficient documentation

## 2022-10-16 DIAGNOSIS — Z5111 Encounter for antineoplastic chemotherapy: Secondary | ICD-10-CM | POA: Diagnosis present

## 2022-10-16 DIAGNOSIS — Z86718 Personal history of other venous thrombosis and embolism: Secondary | ICD-10-CM | POA: Diagnosis not present

## 2022-10-16 DIAGNOSIS — Z7901 Long term (current) use of anticoagulants: Secondary | ICD-10-CM | POA: Diagnosis not present

## 2022-10-16 DIAGNOSIS — Z79899 Other long term (current) drug therapy: Secondary | ICD-10-CM | POA: Insufficient documentation

## 2022-10-16 LAB — COMPREHENSIVE METABOLIC PANEL
ALT: 88 U/L — ABNORMAL HIGH (ref 0–44)
AST: 64 U/L — ABNORMAL HIGH (ref 15–41)
Albumin: 3.6 g/dL (ref 3.5–5.0)
Alkaline Phosphatase: 218 U/L — ABNORMAL HIGH (ref 38–126)
Anion gap: 7 (ref 5–15)
BUN: 10 mg/dL (ref 6–20)
CO2: 23 mmol/L (ref 22–32)
Calcium: 8.3 mg/dL — ABNORMAL LOW (ref 8.9–10.3)
Chloride: 107 mmol/L (ref 98–111)
Creatinine, Ser: 1.06 mg/dL (ref 0.61–1.24)
GFR, Estimated: >60 mL/min (ref >60)
Glucose, Bld: 98 mg/dL (ref 70–99)
Potassium: 4.2 mmol/L (ref 3.5–5.1)
Sodium: 137 mmol/L (ref 135–145)
Total Bilirubin: 0.4 mg/dL (ref 0.3–1.2)
Total Protein: 7.3 g/dL (ref 6.5–8.1)

## 2022-10-16 LAB — CBC WITH DIFFERENTIAL/PLATELET
Abs Immature Granulocytes: 0.92 10*3/uL — ABNORMAL HIGH (ref 0.00–0.07)
Basophils Absolute: 0.1 10*3/uL (ref 0.0–0.1)
Basophils Relative: 1 %
Eosinophils Absolute: 0.1 10*3/uL (ref 0.0–0.5)
Eosinophils Relative: 1 %
HCT: 35.6 % — ABNORMAL LOW (ref 39.0–52.0)
Hemoglobin: 12 g/dL — ABNORMAL LOW (ref 13.0–17.0)
Immature Granulocytes: 6 %
Lymphocytes Relative: 8 %
Lymphs Abs: 1.1 10*3/uL (ref 0.7–4.0)
MCH: 30.1 pg (ref 26.0–34.0)
MCHC: 33.7 g/dL (ref 30.0–36.0)
MCV: 89.2 fL (ref 80.0–100.0)
Monocytes Absolute: 1.1 10*3/uL — ABNORMAL HIGH (ref 0.1–1.0)
Monocytes Relative: 8 %
Neutro Abs: 11.1 10*3/uL — ABNORMAL HIGH (ref 1.7–7.7)
Neutrophils Relative %: 76 %
Platelets: 137 10*3/uL — ABNORMAL LOW (ref 150–400)
RBC: 3.99 MIL/uL — ABNORMAL LOW (ref 4.22–5.81)
RDW: 20.8 % — ABNORMAL HIGH (ref 11.5–15.5)
Smear Review: NORMAL
WBC: 14.4 10*3/uL — ABNORMAL HIGH (ref 4.0–10.5)
nRBC: 0.2 % (ref 0.0–0.2)

## 2022-10-16 LAB — URINALYSIS, DIPSTICK ONLY
Bilirubin Urine: NEGATIVE
Glucose, UA: NEGATIVE mg/dL
Hgb urine dipstick: NEGATIVE
Ketones, ur: NEGATIVE mg/dL
Leukocytes,Ua: NEGATIVE
Nitrite: NEGATIVE
Protein, ur: NEGATIVE mg/dL
Specific Gravity, Urine: 1.021 (ref 1.005–1.030)
pH: 5 (ref 5.0–8.0)

## 2022-10-16 MED ORDER — HEPARIN SOD (PORK) LOCK FLUSH 100 UNIT/ML IV SOLN
500.0000 [IU] | Freq: Once | INTRAVENOUS | Status: AC
  Administered 2022-10-16: 500 [IU] via INTRAVENOUS
  Filled 2022-10-16: qty 5

## 2022-10-16 MED ORDER — SODIUM CHLORIDE 0.9% FLUSH
10.0000 mL | Freq: Once | INTRAVENOUS | Status: AC
  Administered 2022-10-16: 10 mL via INTRAVENOUS
  Filled 2022-10-16: qty 10

## 2022-10-16 NOTE — Progress Notes (Addendum)
[place] NOTE  Patient Care Team: Pcp, No as PCP - General [name], RN as Oncology Nurse Navigator [name], MD as Consulting Physician (Internal Medicine)  CHIEF COMPLAINTS/PURPOSE OF CONSULTATION: Lung cancer  #  Oncology History Overview Note  IMPRESSION: 1. Patchy nodular fat stranding throughout the anterior left upper quadrant peritoneal fat, nonspecific, cannot exclude peritoneal carcinomatosis. Dedicated CT abdomen/pelvis with oral and IV contrast recommended for further evaluation. 2. Dense patchy consolidation replacing much of the left lower lung lobe, appearing masslike in the superior segment left lower lobe, with associated bulging of the left major fissure and associated left lower lobe volume loss. Fine nodularity throughout both lungs with an upper lobe predominance. Asymmetric left upper lobe interlobular septal thickening. These findings are indeterminate, with differential including multilobar pneumonia, sarcoidosis or a neoplastic process. The persistence on radiographs back to 01/20/2021 despite antibiotic therapy make sarcoidosis or a neoplastic process more likely. Pulmonology consultation suggested for consideration of bronchoscopic evaluation. 3. Small dependent left pleural effusion. 4. Mild mediastinal lymphadenopathy, nonspecific. 5. Subacute healing lateral right sixth rib fracture.   DIAGNOSIS:  A. LUNG, LEFT LOWER LOBE; ENB-ASSISTED BIOPSY:  - NON-SMALL CELL CARCINOMA, FAVOR ADENOCARCINOMA.  - FOREIGN MATERIAL SUGGESTIVE OF ASPIRATION.   # EGFR MUTATED: 44 del- June 28th, 2022- [name]. [limited]  # Asymptomatic multiple brain mets subcentimeter asymptomatic -JUNE 29th, 2023- Numerous metastatic lesions in the supratentorial brain-increase in number also conspicuity compared to January 2023.  Pre-SBRT MRI brain AUG 18th, 2023-  Eighteen small enhancing brain metastases (punctate to 11 mm); were all  present on 03/28/2021-s/p SBRT SEP 2023- [[place]]  # SEP-OCT-worsening left-sided pleural effusion-status post paracentesis; exudative; Cytology negative- ?  Progressive disease..   Cancer of lower lobe of left lung ([place])  03/23/2021 Initial Diagnosis   Cancer of lower lobe of left lung ([place])   03/29/2021 Cancer Staging   Staging form: Lung, AJCC 8th Edition - Clinical: Stage IVB (cT3, cN3, pM1c) - Signed by [name], MD on 03/29/2021   03/30/2021 - 03/30/2021 Chemotherapy   Patient is on Treatment Plan : LUNG NSCLC Pemetrexed ([place]) / Carboplatin q21d x 1 cycles     08/17/2022 -  Chemotherapy   Patient is on Treatment Plan : LUNG Atezolizumab + Bevacizumab + Carboplatin + Paclitaxel q21d Induction x 4 cycles / Atezolizumab + Bevacizumab q21d Maintenance      HISTORY OF PRESENTING ILLNESS: Ambulating independently.  With wife.   [name] 42 y.o. male patient with stage IV lung cancer adenocarcinoma-brain mets [synchronous] bone mets EGFR mutated progressed on osimertinib, currently on carbo-taxol-atezo-bev chemotherapy, s/p cycle 3 on 10/02/22, who returns to clinic for follow up and consideration of IV fluids. He saw Nelwyn Salisbury, PA last week and received IV fluids. Today, he feels well. Eating and drinking well. No complaints.   Review of Systems  Constitutional:  Negative for chills, diaphoresis, fever, malaise/fatigue and weight loss.  HENT:  Negative for nosebleeds and sore throat.   Eyes:  Negative for double vision.  Respiratory:  Negative for cough, sputum production, shortness of breath and wheezing.   Cardiovascular:  Negative for chest pain, palpitations, orthopnea and leg swelling.  Gastrointestinal:  Negative for abdominal pain, blood in stool, constipation, diarrhea, heartburn, melena and nausea.  Genitourinary:  Negative for dysuria, frequency and urgency.  Musculoskeletal:  Negative for back pain and joint pain.  Skin:  Negative for itching.   Neurological:  Negative for dizziness, tingling, focal weakness, weakness and  headaches.  Endo/Heme/Allergies:  Does not bruise/bleed easily.  Psychiatric/Behavioral:  Negative for depression. The patient is not nervous/anxious and does not have insomnia.      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    SURGICAL HISTORY: Past Surgical History:  Procedure Laterality Date   FRACTURE SURGERY     broke femur when he was 6 years   PORTA CATH INSERTION N/A 03/28/2021   Procedure: PORTA CATH INSERTION;  Surgeon: Katha Cabal, MD;  Location: [facility];  Service: Cardiovascular;  Laterality: N/A;   VIDEO BRONCHOSCOPY WITH ENDOBRONCHIAL NAVIGATION N/A 03/15/2021   Procedure: VIDEO BRONCHOSCOPY WITH ENDOBRONCHIAL NAVIGATION;  Surgeon: Ottie Glazier, MD;  Location: [facility];  Service: Thoracic;  Laterality: N/A;   VIDEO BRONCHOSCOPY WITH ENDOBRONCHIAL ULTRASOUND N/A 03/15/2021   Procedure: VIDEO BRONCHOSCOPY WITH ENDOBRONCHIAL ULTRASOUND;  Surgeon: Ottie Glazier, MD;  Location: [facility];  Service: Thoracic;  Laterality: N/A;    SOCIAL HISTORY: Social History   Socioeconomic History   Marital status: Married    Spouse name: [name]   Number of children: 2   Years of education: Not on file   Highest education level: Not on file  Occupational History   Not on file  Tobacco Use   Smoking status: Never   Smokeless tobacco: Former    Types: Snuff, Chew  Vaping Use   Vaping Use: Never used  Substance and Sexual Activity   Alcohol use: Never   Drug use: Never   Sexual activity: Yes  Other Topics Concern   Not on file  Social History Narrative   [place] in [place]; with wife; 2 daughters[12 and 22]; never smoked; rare alcohol. 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   Social Determinants of Health   Financial Resource Strain: Low Risk  (05/10/2022)    Overall Financial Resource Strain (CARDIA)    Difficulty of Paying Living Expenses: Not hard at all  Food Insecurity: No Food Insecurity (05/10/2022)   Hunger Vital Sign    Worried About Running Out of Food in the Last Year: Never true    Ran Out of Food in the Last Year: Never true  Transportation Needs: No Transportation Needs (03/16/2022)   PRAPARE - Hydrologist (Medical): No    Lack of Transportation (Non-Medical): No  Physical Activity: Not on file  Stress: Not on file  Social Connections: Socially Integrated (05/10/2022)   Social Connection and Isolation Panel [NHANES]    Frequency of Communication with Friends and Family: More than three times a week    Frequency of Social Gatherings with Friends and Family: More than three times a week    Attends Religious Services: More than 4 times per year    Active Member of [employer] or Organizations: Yes    Attends Music therapist: More than 4 times per year    Marital Status: Married  Human resources officer Violence: Not on file    FAMILY HISTORY: Family History  Problem Relation Age of Onset   High Cholesterol Mother    Hypertension Father    Cancer Father    Lung cancer Father    Skin cancer Father     ALLERGIES:  is allergic to codeine.  MEDICATIONS:  Current Outpatient Medications  Medication Sig Dispense Refill   apixaban (ELIQUIS) 5 MG  TABS tablet Take 1 tablet (5 mg total) by mouth 2 (two) times daily. 60 tablet 2   calcium carbonate (TUMS EX) 750 MG chewable tablet Chew 1 tablet by mouth daily. (Patient not taking: Reported on 08/27/2022)     dronabinol (MARINOL) 2.5 MG capsule Take 1 capsule (2.5 mg total) by mouth 2 (two) times daily before lunch and supper. 60 capsule 0   folic acid (FOLVITE) 1 MG tablet TAKE 1 TABLET BY MOUTH EVERY DAY 90 tablet 1   gabapentin (NEURONTIN) 100 MG capsule Take 1 capsule (100 mg total) by mouth at bedtime. (Patient not taking: Reported on 10/09/2022) 30  capsule 1   lidocaine-prilocaine (EMLA) cream Apply 1 application topically as needed. 30 g 0   lidocaine-prilocaine (EMLA) cream Apply 1 Application topically as needed. 30 g 0   loratadine (CLARITIN) 10 MG tablet Take 10 mg by mouth daily. For 5 days after the treatment.     metoprolol succinate (TOPROL-XL) 50 MG 24 hr tablet Take 50 mg by mouth daily. Take with or immediately following a meal.     omeprazole (PRILOSEC) 20 MG capsule Take 1 capsule (20 mg total) by mouth daily. 30 capsule 2   ondansetron (ZOFRAN) 8 MG tablet One pill every 8 hours as needed for nausea/vomitting. (Patient not taking: Reported on 10/09/2022) 40 tablet 1   oxyCODONE (OXY IR/ROXICODONE) 5 MG immediate release tablet Take 1 tablet (5 mg total) by mouth every 4 (four) hours as needed for severe pain. (Patient not taking: Reported on 10/09/2022) 30 tablet 0   pegfilgrastim (NEULASTA) 6 MG/0.6ML injection Inject 6 mg into the skin once.     potassium chloride SA (KLOR-CON M) 20 MEQ tablet Take 1 tablet (20 mEq total) by mouth daily. (Patient not taking: Reported on 10/09/2022) 14 tablet 0   prochlorperazine (COMPAZINE) 10 MG tablet Take 1 tablet (10 mg total) by mouth every 6 (six) hours as needed for nausea or vomiting. (Patient not taking: Reported on 10/09/2022) 40 tablet 1   sertraline (ZOLOFT) 50 MG tablet TAKE 1/2 TABLET BY MOUTH DAILY 30 tablet 2   sulfamethoxazole-trimethoprim (BACTRIM DS) 800-160 MG tablet Take 1 tablet by mouth 2 (two) times daily for 7 days. 14 tablet 0   traMADol (ULTRAM) 50 MG tablet Take 50 mg by mouth every 6 (six) hours as needed for moderate pain or severe pain. (Patient not taking: Reported on 10/09/2022)     No current facility-administered medications for this visit.   Facility-Administered Medications Ordered in Other Visits  Medication Dose Route Frequency Provider Last Rate Last Admin   heparin lock flush 100 UNIT/ML injection            heparin lock flush 100 UNIT/ML injection             heparin lock flush 100 unit/mL  500 Units Intravenous Once Charlaine Dalton R, MD       sodium chloride flush (NS) 0.9 % injection 10 mL  10 mL Intravenous PRN Charlaine Dalton R, MD   10 mL at 07/28/21 0852   sodium chloride flush (NS) 0.9 % injection 10 mL  10 mL Intravenous Once [name], MD        PHYSICAL EXAMINATION: ECOG PERFORMANCE STATUS: 1 - Symptomatic but completely ambulatory  Vitals:   10/16/22 1300  BP: 123/89  Pulse: 93  Resp: 18  Temp: (!) 97.2 F (36.2 C)   Filed Weights   10/16/22 1300  Weight: 219 lb 14.4 oz (99.7 kg)  Physical Exam Vitals and nursing note reviewed.  Constitutional:      Appearance: He is not ill-appearing.  HENT:     Head: Normocephalic and atraumatic.  Cardiovascular:     Rate and Rhythm: Normal rate and regular rhythm.  Pulmonary:     Effort: No respiratory distress.     Breath sounds: No wheezing.     Comments: Decreased breath sound on the left side compared to right. Abdominal:     General: There is no distension.     Palpations: Abdomen is soft.     Tenderness: There is no abdominal tenderness.  Musculoskeletal:        General: No deformity.  Skin:    General: Skin is warm.     Coloration: Skin is not pale.  Neurological:     Mental Status: He is alert and oriented to person, place, and time.  Psychiatric:        Mood and Affect: Mood and affect normal.        Behavior: Behavior normal.    LABORATORY DATA:  I have reviewed the data as listed Lab Results  Component Value Date   WBC 14.4 (H) 10/16/2022   HGB 12.0 (L) 10/16/2022   HCT 35.6 (L) 10/16/2022   MCV 89.2 10/16/2022   PLT 137 (L) 10/16/2022   Recent Labs    10/09/22 1052 10/11/22 0922 10/16/22 1256  NA 134* 136 137  K 4.2 4.4 4.2  CL 102 106 107  CO2 _0 GLUCOSE 89 96 98  BUN _1 CREATININE 0.75 1.02 1.06  CALCIUM 9.0 8.5* 8.3*  GFRNONAA >60 >60 >60  PROT 7.4 7.3 7.3  ALBUMIN 3.7 3.6 3.6  AST 56* 62*  64*  ALT 68* 80* 88*  ALKPHOS 189* 201* 218*  BILITOT 0.6 0.4 0.4    RADIOGRAPHIC STUDIES: I have personally reviewed the radiological images as listed and agreed with the findings in the report. CT LIVER MASS BIOPSY  Result Date: 09/28/2022 INDICATION: Concern for metastatic disease to the liver EXAM: CT-guided core needle biopsy of liver lesion MEDICATIONS: None. ANESTHESIA/SEDATION: Moderate (conscious) sedation was employed during this procedure. A total of Versed 2 mg and Fentanyl 100 mcg was administered intravenously. Moderate Sedation Time: 26 minutes. The patient's level of consciousness and vital signs were monitored continuously by radiology nursing throughout the procedure under my direct supervision. FLUOROSCOPY TIME:  N/a COMPLICATIONS: None immediate. PROCEDURE: Informed written consent was obtained from the patient after a thorough discussion of the procedural risks, benefits and alternatives. All questions were addressed. Maximal Sterile Barrier Technique was utilized including caps, mask, sterile gowns, sterile gloves, sterile drape, hand hygiene and skin antiseptic. A timeout was performed prior to the initiation of the procedure. The patient was placed supine on the exam table. Limited CT of the abdomen and pelvis with IV contrast was performed for planning purposes. This demonstrated faintly hypoenhancing lesions in the liver, compatible with both recent MRI and PET-CT. The inferior anterior right hepatic lobe lesion was initially selected for biopsy. Skin entry site was marked, and the overlying skin was prepped and draped in the standard sterile fashion. Local analgesia was obtained with 1% lidocaine. Using intermittent CT fluoroscopy, a 17 gauge introducer needle was advanced towards the identified lesion. During needle positioning, the location of the liver was noted to change result of the patient's breathing, and this biopsy target was abandoned due to risk of injury to adjacent  structures. Therefore, the biopsy target  was changed to the inferior posterior right hepatic lesion. Again using CT guidance, a 17 gauge introducer needle was advanced towards the identified lesion using anatomic landmarks. Subsequently, core needle biopsy was performed using an 18 gauge core biopsy device x4 total passes. Specimens were submitted in formalin to pathology for further handling. Limited postprocedure imaging demonstrated no complicating feature. The patient tolerated the procedure well, and was transferred to recovery in stable condition. IMPRESSION: Successful CT-guided core needle biopsy of liver lesion in the inferior posterior right hepatic lobe. Technically challenging biopsy due to poor differentiation of the liver lesion from adjacent liver parenchyma and reliance on anatomic landmarks. Electronically Signed   By: Albin Felling M.D.   On: 09/28/2022 12:38    ASSESSMENT & PLAN:   No problem-specific Assessment & Plan notes found for this encounter.  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 Progressed on osimertinib 80 mg a day- 08/15/22 PET showed progression in liver, new fdg avid nodule w/I central left midlung, new small volume ascites within right iliac fossa and central pelvis with mild adjacent thickening of the peritoneal reflections. Possible lymphangitic spread. Underwent liver biopsy. Limited tissue and NGS was unable to be performed. [facility] recommended checking for MET amplification and pathology which they stated could be run on liver specimen. [facility], RN has ordered and coordinating. He will follow up with Dr Rogue Bussing for continuation of chemotherapy as scheduled. Hold IV fluids today.   Brain metastases- asymptomatic -JUNE 29th, 2023- Numerous metastatic lesions in the supratentorial brain-increase in number also conspicuity compared to January 2023.  Pre-SBRT MRI brain AUG 18th, 2023-  Eighteen small enhancing brain metastases  (punctate to 11 mm); were all present on 03/28/2021-s/p SBRT SEP 2023- [[place]].  August 15, 2022 brain MRI-overall stable. Repeat brain MRI in February.  Elevated LFTs: LFTs are stable. Biopsy recently consistent with malignancy & fibrosis. Monitor.  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 Continue eliquis.    Bone metastases- CT Dec 7th, 2022 to healing process. Dental evaluation on 9/21.  Continue calcium & vitamin D intake. Calcium 8.3- HOLD for now.    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   IV access/- port functioning appropriately. Deaccess today.   DISPOSITION:  deaccess F/u as scheduled- la  All questions were answered. The patient knows to call the clinic with any problems, questions or concerns.   Verlon Au, NP 10/16/2022

## 2022-10-16 NOTE — Progress Notes (Signed)
No complaints today, eating and drinking without nausea.  Does not feel like he needs IVF.

## 2022-10-18 ENCOUNTER — Other Ambulatory Visit: Payer: Self-pay | Admitting: Internal Medicine

## 2022-10-22 ENCOUNTER — Telehealth: Payer: Self-pay

## 2022-10-22 ENCOUNTER — Other Ambulatory Visit: Payer: Self-pay

## 2022-10-22 DIAGNOSIS — C3432 Malignant neoplasm of lower lobe, left bronchus or lung: Secondary | ICD-10-CM

## 2022-10-22 NOTE — Telephone Encounter (Signed)
Called [employer] to check status of Neulasta PA that was submitted on 10/18/22.  [employer] rep stated that the PA had been cancelled and needed to be submitted via [employer].  Informed the rep that the Neulasta is given at home injection and PA is needed.  After further review, patient has new ins policy number that is the the reason of PA request cancellation  PA was submitted again over the phone today and office will get response vai fax with outcome.

## 2022-10-22 NOTE — Telephone Encounter (Addendum)
Spoke to [place] at 3:59 pm and Neulasta PA is in clinical review at this time.  Dr. B recommends patient to come for treatment tomorrow and will f/u on PA tomorrow.  Patient updated via MyChart.

## 2022-10-23 ENCOUNTER — Inpatient Hospital Stay (HOSPITAL_BASED_OUTPATIENT_CLINIC_OR_DEPARTMENT_OTHER): Payer: 59 | Admitting: Internal Medicine

## 2022-10-23 ENCOUNTER — Inpatient Hospital Stay: Payer: 59

## 2022-10-23 ENCOUNTER — Encounter: Payer: Self-pay | Admitting: Internal Medicine

## 2022-10-23 ENCOUNTER — Encounter: Payer: Self-pay | Admitting: Nurse Practitioner

## 2022-10-23 VITALS — BP 120/89 | HR 85 | Temp 98.6°F | Resp 19 | Wt 219.3 lb

## 2022-10-23 DIAGNOSIS — C3432 Malignant neoplasm of lower lobe, left bronchus or lung: Secondary | ICD-10-CM

## 2022-10-23 DIAGNOSIS — Z5112 Encounter for antineoplastic immunotherapy: Secondary | ICD-10-CM | POA: Diagnosis not present

## 2022-10-23 DIAGNOSIS — C3490 Malignant neoplasm of unspecified part of unspecified bronchus or lung: Secondary | ICD-10-CM

## 2022-10-23 LAB — CBC WITH DIFFERENTIAL/PLATELET
Abs Immature Granulocytes: 0.04 10*3/uL (ref 0.00–0.07)
Basophils Absolute: 0.1 10*3/uL (ref 0.0–0.1)
Basophils Relative: 1 %
Eosinophils Absolute: 0 10*3/uL (ref 0.0–0.5)
Eosinophils Relative: 0 %
HCT: 37.3 % — ABNORMAL LOW (ref 39.0–52.0)
Hemoglobin: 12.2 g/dL — ABNORMAL LOW (ref 13.0–17.0)
Immature Granulocytes: 1 %
Lymphocytes Relative: 12 %
Lymphs Abs: 0.9 10*3/uL (ref 0.7–4.0)
MCH: 29.7 pg (ref 26.0–34.0)
MCHC: 32.7 g/dL (ref 30.0–36.0)
MCV: 90.8 fL (ref 80.0–100.0)
Monocytes Absolute: 1 10*3/uL (ref 0.1–1.0)
Monocytes Relative: 14 %
Neutro Abs: 5.1 10*3/uL (ref 1.7–7.7)
Neutrophils Relative %: 72 %
Platelets: 199 10*3/uL (ref 150–400)
RBC: 4.11 MIL/uL — ABNORMAL LOW (ref 4.22–5.81)
RDW: 21.7 % — ABNORMAL HIGH (ref 11.5–15.5)
WBC: 7.1 10*3/uL (ref 4.0–10.5)
nRBC: 0 % (ref 0.0–0.2)

## 2022-10-23 LAB — COMPREHENSIVE METABOLIC PANEL
ALT: 75 U/L — ABNORMAL HIGH (ref 0–44)
AST: 65 U/L — ABNORMAL HIGH (ref 15–41)
Albumin: 3.4 g/dL — ABNORMAL LOW (ref 3.5–5.0)
Alkaline Phosphatase: 179 U/L — ABNORMAL HIGH (ref 38–126)
Anion gap: 5 (ref 5–15)
BUN: 9 mg/dL (ref 6–20)
CO2: 26 mmol/L (ref 22–32)
Calcium: 8.4 mg/dL — ABNORMAL LOW (ref 8.9–10.3)
Chloride: 106 mmol/L (ref 98–111)
Creatinine, Ser: 0.88 mg/dL (ref 0.61–1.24)
GFR, Estimated: >60 mL/min (ref >60)
Glucose, Bld: 102 mg/dL — ABNORMAL HIGH (ref 70–99)
Potassium: 4.2 mmol/L (ref 3.5–5.1)
Sodium: 137 mmol/L (ref 135–145)
Total Bilirubin: 0.5 mg/dL (ref 0.3–1.2)
Total Protein: 6.6 g/dL (ref 6.5–8.1)

## 2022-10-23 LAB — URINALYSIS, DIPSTICK ONLY
Bilirubin Urine: NEGATIVE
Glucose, UA: NEGATIVE mg/dL
Hgb urine dipstick: NEGATIVE
Ketones, ur: NEGATIVE mg/dL
Leukocytes,Ua: NEGATIVE
Nitrite: NEGATIVE
Protein, ur: NEGATIVE mg/dL
Specific Gravity, Urine: 1.017 (ref 1.005–1.030)
pH: 7 (ref 5.0–8.0)

## 2022-10-23 LAB — SURGICAL PATHOLOGY

## 2022-10-23 MED ORDER — HEPARIN SOD (PORK) LOCK FLUSH 100 UNIT/ML IV SOLN
500.0000 [IU] | Freq: Once | INTRAVENOUS | Status: AC | PRN
  Administered 2022-10-23: 500 [IU]
  Filled 2022-10-23: qty 5

## 2022-10-23 MED ORDER — SODIUM CHLORIDE 0.9 % IV SOLN
10.0000 mg | Freq: Once | INTRAVENOUS | Status: AC
  Administered 2022-10-23: 10 mg via INTRAVENOUS
  Filled 2022-10-23: qty 10

## 2022-10-23 MED ORDER — DIPHENHYDRAMINE HCL 50 MG/ML IJ SOLN
50.0000 mg | Freq: Once | INTRAMUSCULAR | Status: AC
  Administered 2022-10-23: 50 mg via INTRAVENOUS
  Filled 2022-10-23: qty 1

## 2022-10-23 MED ORDER — SODIUM CHLORIDE 0.9 % IV SOLN
150.0000 mg | Freq: Once | INTRAVENOUS | Status: AC
  Administered 2022-10-23: 150 mg via INTRAVENOUS
  Filled 2022-10-23: qty 150

## 2022-10-23 MED ORDER — SODIUM CHLORIDE 0.9 % IV SOLN
750.0000 mg | Freq: Once | INTRAVENOUS | Status: AC
  Administered 2022-10-23: 750 mg via INTRAVENOUS
  Filled 2022-10-23: qty 75

## 2022-10-23 MED ORDER — PALONOSETRON HCL INJECTION 0.25 MG/5ML
0.2500 mg | Freq: Once | INTRAVENOUS | Status: AC
  Administered 2022-10-23: 0.25 mg via INTRAVENOUS
  Filled 2022-10-23: qty 5

## 2022-10-23 MED ORDER — SODIUM CHLORIDE 0.9 % IV SOLN
1200.0000 mg | Freq: Once | INTRAVENOUS | Status: AC
  Administered 2022-10-23: 1200 mg via INTRAVENOUS
  Filled 2022-10-23: qty 20

## 2022-10-23 MED ORDER — FAMOTIDINE IN NACL 20-0.9 MG/50ML-% IV SOLN
20.0000 mg | Freq: Once | INTRAVENOUS | Status: AC
  Administered 2022-10-23: 20 mg via INTRAVENOUS
  Filled 2022-10-23: qty 50

## 2022-10-23 MED ORDER — SODIUM CHLORIDE 0.9 % IV SOLN
15.0000 mg/kg | Freq: Once | INTRAVENOUS | Status: AC
  Administered 2022-10-23: 1600 mg via INTRAVENOUS
  Filled 2022-10-23: qty 64

## 2022-10-23 MED ORDER — SODIUM CHLORIDE 0.9 % IV SOLN
175.0000 mg/m2 | Freq: Once | INTRAVENOUS | Status: AC
  Administered 2022-10-23: 396 mg via INTRAVENOUS
  Filled 2022-10-23: qty 66

## 2022-10-23 MED ORDER — SODIUM CHLORIDE 0.9 % IV SOLN
Freq: Once | INTRAVENOUS | Status: AC
  Filled 2022-10-23: qty 250

## 2022-10-23 NOTE — Patient Instructions (Signed)
[facility] AT [place]  Discharge Instructions: Thank you for choosing [facility] to provide your oncology and hematology care.  If you have a lab appointment with the [facility], please go directly to the [facility] and check in at the registration area.  Wear comfortable clothing and clothing appropriate for easy access to any Portacath or PICC line.   We strive to give you quality time with your provider. You may need to reschedule your appointment if you arrive late (15 or more minutes).  Arriving late affects you and other patients whose appointments are after yours.  Also, if you miss three or more appointments without notifying the office, you may be dismissed from the clinic at the provider's discretion.      For prescription refill requests, have your pharmacy contact our office and allow 72 hours for refills to be completed.    Today you received the following chemotherapy and/or immunotherapy agents TECENTRIQ, MVASI, TAXOL, CARBOPALTIN      To help prevent nausea and vomiting after your treatment, we encourage you to take your nausea medication as directed.  BELOW ARE SYMPTOMS THAT SHOULD BE REPORTED IMMEDIATELY: *FEVER GREATER THAN 100.4 F (38 C) OR HIGHER *CHILLS OR SWEATING *NAUSEA AND VOMITING THAT IS NOT CONTROLLED WITH YOUR NAUSEA MEDICATION *UNUSUAL SHORTNESS OF BREATH *UNUSUAL BRUISING OR BLEEDING *URINARY PROBLEMS (pain or burning when urinating, or frequent urination) *BOWEL PROBLEMS (unusual diarrhea, constipation, pain near the anus) TENDERNESS IN MOUTH AND THROAT WITH OR WITHOUT PRESENCE OF ULCERS (sore throat, sores in mouth, or a toothache) UNUSUAL RASH, SWELLING OR PAIN  UNUSUAL VAGINAL DISCHARGE OR ITCHING   Items with * indicate a potential emergency and should be followed up as soon as possible or go to the Emergency Department if any problems should occur.  Please show the CHEMOTHERAPY ALERT CARD or IMMUNOTHERAPY  ALERT CARD at check-in to the Emergency Department and triage nurse.  Should you have questions after your visit or need to cancel or reschedule your appointment, please contact [facility] [place] AT [place]  [phone] and follow the prompts.  Office hours are 8:00 a.m. to 4:30 p.m. Monday - Friday. Please note that voicemails left after 4:00 p.m. may not be returned until the following business day.  We are closed weekends and major holidays. You have access to a nurse at all times for urgent questions. Please call the main number to the clinic [phone] and follow the prompts.  For any non-urgent questions, you may also contact your provider using MyChart. We now offer e-Visits for anyone 35 and older to request care online for non-urgent symptoms. For details visit [email or web address].   Also download the MyChart app! Go to the app store, search "MyChart", open the app, select [facility], and log in with your MyChart username and password.   Atezolizumab Injection What is this medication? ATEZOLIZUMAB (a te zoe LIZ ue mab) treats some types of cancer. It works by helping your immune system slow or stop the spread of cancer cells. It is a monoclonal antibody. This medicine may be used for other purposes; ask your health care provider or pharmacist if you have questions. COMMON BRAND NAME(S): Tecentriq What should I tell my care team before I take this medication? They need to know if you have any of these conditions: Allogeneic stem cell transplant (uses someone else's stem cells) Autoimmune diseases, such as Crohn disease, ulcerative colitis, lupus History of chest radiation Nervous system problems, such as Guillain-Barre  syndrome, myasthenia gravis Organ transplant An unusual or allergic reaction to atezolizumab, other medications, foods, dyes, or preservatives Pregnant or trying to get pregnant Breast-feeding How should I use this medication? This medication is  injected into a vein. It is given by your care team in a hospital or clinic setting. A special MedGuide will be given to you before each treatment. Be sure to read this information carefully each time. Talk to your care team about the use of this medication in children. While it may be prescribed for children as young as 2 years for selected conditions, precautions do apply. Overdosage: If you think you have taken too much of this medicine contact a poison control center or emergency room at once. NOTE: This medicine is only for you. Do not share this medicine with others. What if I miss a dose? Keep appointments for follow-up doses. It is important not to miss your dose. Call your care team if you are unable to keep an appointment. What may interact with this medication? Interactions have not been studied. This list may not describe all possible interactions. Give your health care provider a list of all the medicines, herbs, non-prescription drugs, or dietary supplements you use. Also tell them if you smoke, drink alcohol, or use illegal drugs. Some items may interact with your medicine. What should I watch for while using this medication? Your condition will be monitored carefully while you are receiving this medication. You may need blood work while taking this medication. This medication may cause serious skin reactions. They can happen weeks to months after starting the medication. Contact your care team right away if you notice fevers or flu-like symptoms with a rash. The rash may be red or purple and then turn into blisters or peeling of the skin. You may also notice a red rash with swelling of the face, lips, or lymph nodes in your neck or under your arms. Tell your care team right away if you have any change in your eyesight. Talk to your care team if you may be pregnant. Serious birth defects can occur if you take this medication during pregnancy and for 5 months after the last dose. You will  need a negative pregnancy test before starting this medication. Contraception is recommended while taking this medication and for 5 months after the last dose. Your care team can help you find the option that works for you. Do not breastfeed while taking this medication and for at least 5 months after the last dose. What side effects may I notice from receiving this medication? Side effects that you should report to your doctor or health care professional as soon as possible: Allergic reactions--skin rash, itching, hives, swelling of the face, lips, tongue, or throat Dry cough, shortness of breath or trouble breathing Eye pain, redness, irritation, or discharge with blurry or decreased vision Heart muscle inflammation--unusual weakness or fatigue, shortness of breath, chest pain, fast or irregular heartbeat, dizziness, swelling of the ankles, feet, or hands Hormone gland problems--headache, sensitivity to light, unusual weakness or fatigue, dizziness, fast or irregular heartbeat, increased sensitivity to cold or heat, excessive sweating, constipation, hair loss, increased thirst or amount of urine, tremors or shaking, irritability Infusion reactions--chest pain, shortness of breath or trouble breathing, feeling faint or lightheaded Kidney injury (glomerulonephritis)--decrease in the amount of urine, red or dark brown urine, foamy or bubbly urine, swelling of the ankles, hands, or feet Liver injury--right upper belly pain, loss of appetite, nausea, light-colored stool, dark yellow or  brown urine, yellowing skin or eyes, unusual weakness or fatigue Pain, tingling, or numbness in the hands or feet, muscle weakness, change in vision, confusion or trouble speaking, loss of balance or coordination, trouble walking, seizures Rash, fever, and swollen lymph nodes Redness, blistering, peeling, or loosening of the skin, including inside the mouth Sudden or severe stomach pain, bloody diarrhea, fever, nausea,  vomiting Side effects that usually do not require medical attention (report to your doctor or health care professional if they continue or are bothersome): Bone, joint, or muscle pain Diarrhea Fatigue Loss of appetite Nausea Skin rash This list may not describe all possible side effects. Call your doctor for medical advice about side effects. You may report side effects to FDA at 1-800-FDA-1088. Where should I keep my medication? This medication is given in a hospital or clinic. It will not be stored at home. NOTE: This sheet is a summary. It may not cover all possible information. If you have questions about this medicine, talk to your doctor, pharmacist, or health care provider.  2023 Elsevier/Gold Standard (2022-02-13 00:00:00)  Bevacizumab Injection What is this medication? BEVACIZUMAB (be va SIZ yoo mab) treats some types of cancer. It works by blocking a protein that causes cancer cells to grow and multiply. This helps to slow or stop the spread of cancer cells. It is a monoclonal antibody. This medicine may be used for other purposes; ask your health care provider or pharmacist if you have questions. COMMON BRAND NAME(S): Alymsys, Avastin, MVASI, Noah Charon What should I tell my care team before I take this medication? They need to know if you have any of these conditions: Blood clots Coughing up blood Having or recent surgery Heart failure High blood pressure History of a connection between 2 or more body parts that do not usually connect (fistula) History of a tear in your stomach or intestines Protein in your urine An unusual or allergic reaction to bevacizumab, other medications, foods, dyes, or preservatives Pregnant or trying to get pregnant Breast-feeding How should I use this medication? This medication is injected into a vein. It is given by your care team in a hospital or clinic setting. Talk to your care team the use of this medication in children. Special care may  be needed. Overdosage: If you think you have taken too much of this medicine contact a poison control center or emergency room at once. NOTE: This medicine is only for you. Do not share this medicine with others. What if I miss a dose? Keep appointments for follow-up doses. It is important not to miss your dose. Call your care team if you are unable to keep an appointment. What may interact with this medication? Interactions are not expected. This list may not describe all possible interactions. Give your health care provider a list of all the medicines, herbs, non-prescription drugs, or dietary supplements you use. Also tell them if you smoke, drink alcohol, or use illegal drugs. Some items may interact with your medicine. What should I watch for while using this medication? Your condition will be monitored carefully while you are receiving this medication. You may need blood work while taking this medication. This medication may make you feel generally unwell. This is not uncommon as chemotherapy can affect healthy cells as well as cancer cells. Report any side effects. Continue your course of treatment even though you feel ill unless your care team tells you to stop. This medication may increase your risk to bruise or bleed. Call your care  team if you notice any unusual bleeding. Before having surgery, talk to your care team to make sure it is ok. This medication can increase the risk of poor healing of your surgical site or wound. You will need to stop this medication for 28 days before surgery. After surgery, wait at least 28 days before restarting this medication. Make sure the surgical site or wound is healed enough before restarting this medication. Talk to your care team if questions. Talk to your care team if you may be pregnant. Serious birth defects can occur if you take this medication during pregnancy and for 6 months after the last dose. Contraception is recommended while taking this  medication and for 6 months after the last dose. Your care team can help you find the option that works for you. Do not breastfeed while taking this medication and for 6 months after the last dose. This medication can cause infertility. Talk to your care team if you are concerned about your fertility. What side effects may I notice from receiving this medication? Side effects that you should report to your care team as soon as possible: Allergic reactions--skin rash, itching, hives, swelling of the face, lips, tongue, or throat Bleeding--bloody or black, tar-like stools, vomiting blood or brown material that looks like coffee grounds, red or dark brown urine, small red or purple spots on skin, unusual bruising or bleeding Blood clot--pain, swelling, or warmth in the leg, shortness of breath, chest pain Heart attack--pain or tightness in the chest, shoulders, arms, or jaw, nausea, shortness of breath, cold or clammy skin, feeling faint or lightheaded Heart failure--shortness of breath, swelling of the ankles, feet, or hands, sudden weight gain, unusual weakness or fatigue Increase in blood pressure Infection--fever, chills, cough, sore throat, wounds that don't heal, pain or trouble when passing urine, general feeling of discomfort or being unwell Infusion reactions--chest pain, shortness of breath or trouble breathing, feeling faint or lightheaded Kidney injury--decrease in the amount of urine, swelling of the ankles, hands, or feet Stomach pain that is severe, does not go away, or gets worse Stroke--sudden numbness or weakness of the face, arm, or leg, trouble speaking, confusion, trouble walking, loss of balance or coordination, dizziness, severe headache, change in vision Sudden and severe headache, confusion, change in vision, seizures, which may be signs of posterior reversible encephalopathy syndrome (PRES) Side effects that usually do not require medical attention (report to your care team if  they continue or are bothersome): Back pain Change in taste Diarrhea Dry skin Increased tears Nosebleed This list may not describe all possible side effects. Call your doctor for medical advice about side effects. You may report side effects to FDA at 1-800-FDA-1088. Where should I keep my medication? This medication is given in a hospital or clinic. It will not be stored at home. NOTE: This sheet is a summary. It may not cover all possible information. If you have questions about this medicine, talk to your doctor, pharmacist, or health care provider.  2023 Elsevier/Gold Standard (2022-02-02 00:00:00)   Paclitaxel Injection What is this medication? PACLITAXEL (PAK li TAX el) treats some types of cancer. It works by slowing down the growth of cancer cells. This medicine may be used for other purposes; ask your health care provider or pharmacist if you have questions. COMMON BRAND NAME(S): Onxol, Taxol What should I tell my care team before I take this medication? They need to know if you have any of these conditions: Heart disease Liver disease Low white  blood cell levels An unusual or allergic reaction to paclitaxel, other medications, foods, dyes, or preservatives If you or your partner are pregnant or trying to get pregnant Breast-feeding How should I use this medication? This medication is injected into a vein. It is given by your care team in a hospital or clinic setting. Talk to your care team about the use of this medication in children. While it may be given to children for selected conditions, precautions do apply. Overdosage: If you think you have taken too much of this medicine contact a poison control center or emergency room at once. NOTE: This medicine is only for you. Do not share this medicine with others. What if I miss a dose? Keep appointments for follow-up doses. It is important not to miss your dose. Call your care team if you are unable to keep an  appointment. What may interact with this medication? Do not take this medication with any of the following: Live virus vaccines Other medications may affect the way this medication works. Talk with your care team about all of the medications you take. They may suggest changes to your treatment plan to lower the risk of side effects and to make sure your medications work as intended. This list may not describe all possible interactions. Give your health care provider a list of all the medicines, herbs, non-prescription drugs, or dietary supplements you use. Also tell them if you smoke, drink alcohol, or use illegal drugs. Some items may interact with your medicine. What should I watch for while using this medication? Your condition will be monitored carefully while you are receiving this medication. You may need blood work while taking this medication. This medication may make you feel generally unwell. This is not uncommon as chemotherapy can affect healthy cells as well as cancer cells. Report any side effects. Continue your course of treatment even though you feel ill unless your care team tells you to stop. This medication can cause serious allergic reactions. To reduce the risk, your care team may give you other medications to take before receiving this one. Be sure to follow the directions from your care team. This medication may increase your risk of getting an infection. Call your care team for advice if you get a fever, chills, sore throat, or other symptoms of a cold or flu. Do not treat yourself. Try to avoid being around people who are sick. This medication may increase your risk to bruise or bleed. Call your care team if you notice any unusual bleeding. Be careful brushing or flossing your teeth or using a toothpick because you may get an infection or bleed more easily. If you have any dental work done, tell your dentist you are receiving this medication. Talk to your care team if you may be  pregnant. Serious birth defects can occur if you take this medication during pregnancy. Talk to your care team before breastfeeding. Changes to your treatment plan may be needed. What side effects may I notice from receiving this medication? Side effects that you should report to your care team as soon as possible: Allergic reactions--skin rash, itching, hives, swelling of the face, lips, tongue, or throat Heart rhythm changes--fast or irregular heartbeat, dizziness, feeling faint or lightheaded, chest pain, trouble breathing Increase in blood pressure Infection--fever, chills, cough, sore throat, wounds that don't heal, pain or trouble when passing urine, general feeling of discomfort or being unwell Low blood pressure--dizziness, feeling faint or lightheaded, blurry vision Low red blood cell level--unusual  weakness or fatigue, dizziness, headache, trouble breathing Painful swelling, warmth, or redness of the skin, blisters or sores at the infusion site Pain, tingling, or numbness in the hands or feet Slow heartbeat--dizziness, feeling faint or lightheaded, confusion, trouble breathing, unusual weakness or fatigue Unusual bruising or bleeding Side effects that usually do not require medical attention (report to your care team if they continue or are bothersome): Diarrhea Hair loss Joint pain Loss of appetite Muscle pain Nausea Vomiting This list may not describe all possible side effects. Call your doctor for medical advice about side effects. You may report side effects to FDA at 1-800-FDA-1088. Where should I keep my medication? This medication is given in a hospital or clinic. It will not be stored at home. NOTE: This sheet is a summary. It may not cover all possible information. If you have questions about this medicine, talk to your doctor, pharmacist, or health care provider.  2023 Elsevier/Gold Standard (2022-01-31 00:00:00)  Carboplatin Injection What is this  medication? CARBOPLATIN (KAR boe pla tin) treats some types of cancer. It works by slowing down the growth of cancer cells. This medicine may be used for other purposes; ask your health care provider or pharmacist if you have questions. COMMON BRAND NAME(S): Paraplatin What should I tell my care team before I take this medication? They need to know if you have any of these conditions: Blood disorders Hearing problems Kidney disease Recent or ongoing radiation therapy An unusual or allergic reaction to carboplatin, cisplatin, other medications, foods, dyes, or preservatives Pregnant or trying to get pregnant Breast-feeding How should I use this medication? This medication is injected into a vein. It is given by your care team in a hospital or clinic setting. Talk to your care team about the use of this medication in children. Special care may be needed. Overdosage: If you think you have taken too much of this medicine contact a poison control center or emergency room at once. NOTE: This medicine is only for you. Do not share this medicine with others. What if I miss a dose? Keep appointments for follow-up doses. It is important not to miss your dose. Call your care team if you are unable to keep an appointment. What may interact with this medication? Medications for seizures Some antibiotics, such as amikacin, gentamicin, neomycin, streptomycin, tobramycin Vaccines This list may not describe all possible interactions. Give your health care provider a list of all the medicines, herbs, non-prescription drugs, or dietary supplements you use. Also tell them if you smoke, drink alcohol, or use illegal drugs. Some items may interact with your medicine. What should I watch for while using this medication? Your condition will be monitored carefully while you are receiving this medication. You may need blood work while taking this medication. This medication may make you feel generally unwell. This  is not uncommon, as chemotherapy can affect healthy cells as well as cancer cells. Report any side effects. Continue your course of treatment even though you feel ill unless your care team tells you to stop. In some cases, you may be given additional medications to help with side effects. Follow all directions for their use. This medication may increase your risk of getting an infection. Call your care team for advice if you get a fever, chills, sore throat, or other symptoms of a cold or flu. Do not treat yourself. Try to avoid being around people who are sick. Avoid taking medications that contain aspirin, acetaminophen, ibuprofen, naproxen, or ketoprofen unless instructed  by your care team. These medications may hide a fever. Be careful brushing or flossing your teeth or using a toothpick because you may get an infection or bleed more easily. If you have any dental work done, tell your dentist you are receiving this medication. Talk to your care team if you wish to become pregnant or think you might be pregnant. This medication can cause serious birth defects. Talk to your care team about effective forms of contraception. Do not breast-feed while taking this medication. What side effects may I notice from receiving this medication? Side effects that you should report to your care team as soon as possible: Allergic reactions--skin rash, itching, hives, swelling of the face, lips, tongue, or throat Infection--fever, chills, cough, sore throat, wounds that don't heal, pain or trouble when passing urine, general feeling of discomfort or being unwell Low red blood cell level--unusual weakness or fatigue, dizziness, headache, trouble breathing Pain, tingling, or numbness in the hands or feet, muscle weakness, change in vision, confusion or trouble speaking, loss of balance or coordination, trouble walking, seizures Unusual bruising or bleeding Side effects that usually do not require medical attention  (report to your care team if they continue or are bothersome): Hair loss Nausea Unusual weakness or fatigue Vomiting This list may not describe all possible side effects. Call your doctor for medical advice about side effects. You may report side effects to FDA at 1-800-FDA-1088. Where should I keep my medication? This medication is given in a hospital or clinic. It will not be stored at home. NOTE: This sheet is a summary. It may not cover all possible information. If you have questions about this medicine, talk to your doctor, pharmacist, or health care provider.  2023 Elsevier/Gold Standard (2022-01-15 00:00:00)

## 2022-10-23 NOTE — Telephone Encounter (Signed)
Confirmed with [facility] they are aware of PA approval.

## 2022-10-23 NOTE — Progress Notes (Signed)
Patient has no concerns today. 

## 2022-10-23 NOTE — Telephone Encounter (Signed)
PA approved 10/22/22 thru 01/22/23.

## 2022-10-23 NOTE — Assessment & Plan Note (Addendum)
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 While on osimertinib 80 mg a day [since June 28th, 2022]-noted to have progressive disease.  NOV 1st, 2023- PET scan- shows progressive disease in liver.  New tracer avid upper abdominal lymph nodes compatible with nodal metastasis; New tracer avid nodule within the central left midlung compatible with concerning for locally recurrent tumor or metastatic disease.; Similar appearance of large loculated left pleural effusion with mild overlying tracer uptake; New small volume of ascites within the right iliac fossa and central pelvis with mild adjacent thickening of the peritoneal reflections. Progressive interlobular septal thickening within the right middle lobe and right lower lobe. Findings are nonspecific and may reflect asymmetric pulmonary edema. Lymphangitic spread of tumor is not excluded.  Recommend liver biopsy to rule out transformation. Unable to perform ultrasound-guided biopsy liver- see below. Repeat NGS testing.    Liquid Biopsy/Guardant testing-positive for EGFR mutation; otherwise Negative for any novel mutations.  Patient currently chemo-immunotherapy-carbo+ Taxol plus Avastin plus Atezo. D-2 neulasta.  S/p repeat liver Biopsy positive for adenocarcinoma. S/p second opinion at [facility].   # Proceed with cycle #4 of carbo Taxol-Atezo+Bev today.  labs today reviewed;  acceptable for treatment today. D-2 neulasta/home.   # Neutropenia- sec to chemo- [dose reduced neulasta 4 mcg with cycle #3]- proceed with 6 mcg today.   #Asymptomatic multiple brain mets subcentimeter asymptomatic -JUNE 29th, 2023- Numerous metastatic lesions in the supratentorial brain-increase in number also conspicuity compared to January 2023.  Pre-SBRT MRI brain AUG 18th, 2023-  Eighteen small enhancing brain metastases (punctate to 11 mm); were all present on 03/28/2021-s/p SBRT SEP 2023- [[place]].  August 15, 2022 brain  MRI-overall stable.  Discussed with radiation oncology at [facility].  Repeat brain MRI in February.  #Elevated LFTs: AST ALT 100 range; elevated alkaline phosphatase normal bilirubin-likely secondary progressive disease in the liver. NOV 2023- MRI liver- . Diffuse hepatic metastatic disease with numerous lesions throughout both lobes of the liver-- monitor for now.  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 On Eliquis-5 mg BID.   #Bone metastases- CT DEc 7th, 2022 to healing process.  Dental evaluation on 9/21.  Continue calcium vitamin D intake. calcium 8.5- HOLD for now. STABLE.  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  Will refill- STABLE.  # IV access/ port flush.   # [*home- neulasta- with cycle #2.   # DISPOSITION:  # Chemo [carbo-Taxol-Atezo+Bev] today.   # follow up in 1 week- APP- labs- cbc/cmp; possible IVFs over 1 hour  # follow up in 2 week- APP- labs- cbc/cmp; possible IVFs over 1 hour  # Follow up in 3 weeks- MD; labs- cbc/cmp; CEA; UA;Atezo+Bev; PET prior- Dr.B

## 2022-10-23 NOTE — Progress Notes (Unsigned)
[name] NOTE  Patient Care Team: Pcp, No as PCP - General [name], RN as Oncology Nurse Navigator [name], MD as Consulting Physician (Internal Medicine)  CHIEF COMPLAINTS/PURPOSE OF CONSULTATION: Lung cancer  #  Oncology History Overview Note  IMPRESSION: 1. Patchy nodular fat stranding throughout the anterior left upper quadrant peritoneal fat, nonspecific, cannot exclude peritoneal carcinomatosis. Dedicated CT abdomen/pelvis with oral and IV contrast recommended for further evaluation. 2. Dense patchy consolidation replacing much of the left lower lung lobe, appearing masslike in the superior segment left lower lobe, with associated bulging of the left major fissure and associated left lower lobe volume loss. Fine nodularity throughout both lungs with an upper lobe predominance. Asymmetric left upper lobe interlobular septal thickening. These findings are indeterminate, with differential including multilobar pneumonia, sarcoidosis or a neoplastic process. The persistence on radiographs back to 01/20/2021 despite antibiotic therapy make sarcoidosis or a neoplastic process more likely. Pulmonology consultation suggested for consideration of bronchoscopic evaluation. 3. Small dependent left pleural effusion. 4. Mild mediastinal lymphadenopathy, nonspecific. 5. Subacute healing lateral right sixth rib fracture.   DIAGNOSIS:  A. LUNG, LEFT LOWER LOBE; ENB-ASSISTED BIOPSY:  - NON-SMALL CELL CARCINOMA, FAVOR ADENOCARCINOMA.  - FOREIGN MATERIAL SUGGESTIVE OF ASPIRATION.   # EGFR MUTATED: 58 del- June 28th, 2022- [name]. [limited]  # Asymptomatic multiple brain mets subcentimeter asymptomatic -JUNE 29th, 2023- Numerous metastatic lesions in the supratentorial brain-increase in number also conspicuity compared to January 2023.  Pre-SBRT MRI brain AUG 18th, 2023-  Eighteen small enhancing brain metastases (punctate to 11 mm); were all  present on 03/28/2021-s/p SBRT SEP 2023- [[place]]  # SEP-OCT-worsening left-sided pleural effusion-status post paracentesis; exudative; Cytology negative- ?  Progressive disease..   Cancer of lower lobe of left lung ([place])  03/23/2021 Initial Diagnosis   Cancer of lower lobe of left lung ([place])   03/29/2021 Cancer Staging   Staging form: Lung, AJCC 8th Edition - Clinical: Stage IVB (cT3, cN3, pM1c) - Signed by [name], MD on 03/29/2021   03/30/2021 - 03/30/2021 Chemotherapy   Patient is on Treatment Plan : LUNG NSCLC Pemetrexed ([place]) / Carboplatin q21d x 1 cycles     08/17/2022 -  Chemotherapy   Patient is on Treatment Plan : LUNG Atezolizumab + Bevacizumab + Carboplatin + Paclitaxel q21d Induction x 4 cycles / Atezolizumab + Bevacizumab q21d Maintenance      HISTORY OF PRESENTING ILLNESS: Ambulating independently.  With wife.   [name] 42 y.o.  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    In the interim patient was evaluated at [facility] for second opinion.  He is also status post recently biopsy.   Patient received cycle #3  approximately 3 weeks ago.  Patient needed IV fluids almost weekly posttreatment-mostly for fatigue/dehydration.  Patient has no concerns today   Patient had joint pains after his growth factor injection.  Currently resolved.   Appetite is better.  However has lost some weight.  Abdominal pain improved.  Denies any worsening headaches. Denies any worsening pain.  No worsening skin rash.  Denies any worsening tingling or numbness.  Review of Systems  Constitutional:  Positive for malaise/fatigue. Negative for chills, diaphoresis and fever.  HENT:  Negative for nosebleeds and sore throat.   Eyes:  Negative for double vision.  Respiratory:  Positive for cough. Negative for wheezing.    Cardiovascular:  Negative for chest pain, palpitations, orthopnea and leg swelling.  Gastrointestinal:  Positive for heartburn and nausea. Negative for abdominal pain, blood in stool, diarrhea and melena.  Genitourinary:  Negative for dysuria, frequency and urgency.  Musculoskeletal:  Negative for back pain and joint pain.  Skin:  Negative for itching.  Neurological:  Positive for dizziness, weakness and headaches. Negative for tingling and focal weakness.  Endo/Heme/Allergies:  Does not bruise/bleed easily.  Psychiatric/Behavioral:  Negative for depression. The patient is not nervous/anxious and does not have insomnia.      MEDICAL HISTORY:  Past Medical History:  Diagnosis Date   Cancer ([place])    Dyspnea    Heartburn    Pneumonia    Wolff-Parkinson-White (WPW) 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    SURGICAL HISTORY: Past Surgical History:  Procedure Laterality Date   FRACTURE SURGERY     broke femur when he was 6 years   PORTA CATH INSERTION N/A 03/28/2021   Procedure: PORTA CATH INSERTION;  Surgeon: Katha Cabal, MD;  Location: [facility];  Service: Cardiovascular;  Laterality: N/A;   VIDEO BRONCHOSCOPY WITH ENDOBRONCHIAL NAVIGATION N/A 03/15/2021   Procedure: VIDEO BRONCHOSCOPY WITH ENDOBRONCHIAL NAVIGATION;  Surgeon: Ottie Glazier, MD;  Location: [facility];  Service: Thoracic;  Laterality: N/A;   VIDEO BRONCHOSCOPY WITH ENDOBRONCHIAL ULTRASOUND N/A 03/15/2021   Procedure: VIDEO BRONCHOSCOPY WITH ENDOBRONCHIAL ULTRASOUND;  Surgeon: Ottie Glazier, MD;  Location: [facility];  Service: Thoracic;  Laterality: N/A;    SOCIAL HISTORY: Social History   Socioeconomic History   Marital status: Married    Spouse name: [name]   Number of children: 2   Years of education: Not on file   Highest education level: Not on file  Occupational History   Not on file  Tobacco Use   Smoking status: Never   Smokeless tobacco: Former    Types: Snuff, Chew  Vaping Use   Vaping Use:  Never used  Substance and Sexual Activity   Alcohol use: Never   Drug use: Never   Sexual activity: Yes  Other Topics Concern   Not on file  Social History Narrative   [place]ives in [place]; with wife; 2 daughters[12 and 22]; never smoked; rare alcohol. 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   Social Determinants of Health   Financial Resource Strain: Low Risk  (05/10/2022)   Overall Financial Resource Strain (CARDIA)    Difficulty of Paying Living Expenses: Not hard at all  Food Insecurity: No Food Insecurity (05/10/2022)   Hunger Vital Sign    Worried About Running Out of Food in the Last Year: Never true    Ran Out of Food in the Last Year: Never true  Transportation Needs: No Transportation Needs (03/16/2022)   PRAPARE - Hydrologist (Medical): No    Lack of Transportation (Non-Medical): No  Physical Activity: Not on file  Stress: Not on file  Social Connections: Socially Integrated (05/10/2022)   Social Connection and Isolation Panel [NHANES]    Frequency of Communication with Friends and Family: More than three times a week    Frequency of Social Gatherings with Friends and Family: More than three times a week    Attends Religious Services: More than 4 times per year    Active Member of [employer] or Organizations: Yes    Attends Music therapist: More than 4 times per year    Marital Status: Married  Human resources officer Violence: Not on file  FAMILY HISTORY: Family History  Problem Relation Age of Onset   High Cholesterol Mother    Hypertension Father    Cancer Father    Lung cancer Father    Skin cancer Father     ALLERGIES:  is allergic to codeine.  MEDICATIONS:  Current Outpatient Medications  Medication Sig Dispense Refill   apixaban (ELIQUIS) 5 MG TABS tablet Take 1 tablet (5 mg total) by mouth 2 (two) times daily. 60 tablet 2   dronabinol (MARINOL) 2.5 MG capsule Take 1 capsule (2.5 mg total) by mouth 2 (two) times daily before lunch  and supper. 60 capsule 0   folic acid (FOLVITE) 1 MG tablet TAKE 1 TABLET BY MOUTH EVERY DAY 90 tablet 1   lidocaine-prilocaine (EMLA) cream Apply 1 application topically as needed. 30 g 0   lidocaine-prilocaine (EMLA) cream Apply 1 Application topically as needed. 30 g 0   loratadine (CLARITIN) 10 MG tablet Take 10 mg by mouth daily. For 5 days after the treatment.     metoprolol succinate (TOPROL-XL) 50 MG 24 hr tablet Take 50 mg by mouth daily. Take with or immediately following a meal.     omeprazole (PRILOSEC) 20 MG capsule Take 1 capsule (20 mg total) by mouth daily. 30 capsule 2   pegfilgrastim (NEULASTA) 6 MG/0.6ML injection Inject 6 mg into the skin once.     sertraline (ZOLOFT) 50 MG tablet TAKE 1/2 TABLET BY MOUTH DAILY 30 tablet 2   calcium carbonate (TUMS EX) 750 MG chewable tablet Chew 1 tablet by mouth daily. (Patient not taking: Reported on 08/27/2022)     gabapentin (NEURONTIN) 100 MG capsule Take 1 capsule (100 mg total) by mouth at bedtime. (Patient not taking: Reported on 10/09/2022) 30 capsule 1   ondansetron (ZOFRAN) 8 MG tablet One pill every 8 hours as needed for nausea/vomitting. (Patient not taking: Reported on 10/09/2022) 40 tablet 1   oxyCODONE (OXY IR/ROXICODONE) 5 MG immediate release tablet Take 1 tablet (5 mg total) by mouth every 4 (four) hours as needed for severe pain. (Patient not taking: Reported on 10/09/2022) 30 tablet 0   potassium chloride SA (KLOR-CON M) 20 MEQ tablet Take 1 tablet (20 mEq total) by mouth daily. (Patient not taking: Reported on 10/09/2022) 14 tablet 0   prochlorperazine (COMPAZINE) 10 MG tablet Take 1 tablet (10 mg total) by mouth every 6 (six) hours as needed for nausea or vomiting. (Patient not taking: Reported on 10/09/2022) 40 tablet 1   traMADol (ULTRAM) 50 MG tablet Take 50 mg by mouth every 6 (six) hours as needed for moderate pain or severe pain. (Patient not taking: Reported on 10/09/2022)     No current facility-administered  medications for this visit.   Facility-Administered Medications Ordered in Other Visits  Medication Dose Route Frequency Provider Last Rate Last Admin   heparin lock flush 100 UNIT/ML injection            heparin lock flush 100 UNIT/ML injection            sodium chloride flush (NS) 0.9 % injection 10 mL  10 mL Intravenous PRN [name], MD   10 mL at 07/28/21 0852      .  PHYSICAL EXAMINATION: ECOG PERFORMANCE STATUS: 1 - Symptomatic but completely ambulatory  There were no vitals filed for this visit.     Filed Weights   10/23/22 0903  Weight: 219 lb 4.8 oz (99.5 kg)        Physical Exam HENT:  Head: Normocephalic and atraumatic.     Mouth/Throat:     Pharynx: No oropharyngeal exudate.  Eyes:     Pupils: Pupils are equal, round, and reactive to light.  Cardiovascular:     Rate and Rhythm: Normal rate and regular rhythm.  Pulmonary:     Effort: No respiratory distress.     Breath sounds: No wheezing.     Comments: Decreased breath sound on the left side compared to right. Abdominal:     General: Bowel sounds are normal. There is no distension.     Palpations: Abdomen is soft. There is no mass.     Tenderness: There is no abdominal tenderness. There is no guarding or rebound.  Musculoskeletal:        General: No tenderness. Normal range of motion.     Cervical back: Normal range of motion and neck supple.  Skin:    General: Skin is warm.  Neurological:     Mental Status: He is alert and oriented to person, place, and time.  Psychiatric:        Mood and Affect: Affect normal.    Fungal dermatitis bilateral buttocks groin/underneath abdominal pannus  LABORATORY DATA:  I have reviewed the data as listed Lab Results  Component Value Date   WBC 14.4 (H) 10/16/2022   HGB 12.0 (L) 10/16/2022   HCT 35.6 (L) 10/16/2022   MCV 89.2 10/16/2022   PLT 137 (L) 10/16/2022   Recent Labs    10/09/22 1052 10/11/22 0922 10/16/22 1256  NA 134* 136  137  K 4.2 4.4 4.2  CL 102 106 107  CO2 28 24 23   GLUCOSE 89 96 98  BUN 13 17 10   CREATININE 0.75 1.02 1.06  CALCIUM 9.0 8.5* 8.3*  GFRNONAA >60 >60 >60  PROT 7.4 7.3 7.3  ALBUMIN 3.7 3.6 3.6  AST 56* 62* 64*  ALT 68* 80* 88*  ALKPHOS 189* 201* 218*  BILITOT 0.6 0.4 0.4     RADIOGRAPHIC STUDIES: I have personally reviewed the radiological images as listed and agreed with the findings in the report. CT LIVER MASS BIOPSY  Result Date: 09/28/2022 INDICATION: Concern for metastatic disease to the liver EXAM: CT-guided core needle biopsy of liver lesion MEDICATIONS: None. ANESTHESIA/SEDATION: Moderate (conscious) sedation was employed during this procedure. A total of Versed 2 mg and Fentanyl 100 mcg was administered intravenously. Moderate Sedation Time: 26 minutes. The patient's level of consciousness and vital signs were monitored continuously by radiology nursing throughout the procedure under my direct supervision. FLUOROSCOPY TIME:  N/a COMPLICATIONS: None immediate. PROCEDURE: Informed written consent was obtained from the patient after a thorough discussion of the procedural risks, benefits and alternatives. All questions were addressed. Maximal Sterile Barrier Technique was utilized including caps, mask, sterile gowns, sterile gloves, sterile drape, hand hygiene and skin antiseptic. A timeout was performed prior to the initiation of the procedure. The patient was placed supine on the exam table. Limited CT of the abdomen and pelvis with IV contrast was performed for planning purposes. This demonstrated faintly hypoenhancing lesions in the liver, compatible with both recent MRI and PET-CT. The inferior anterior right hepatic lobe lesion was initially selected for biopsy. Skin entry site was marked, and the overlying skin was prepped and draped in the standard sterile fashion. Local analgesia was obtained with 1% lidocaine. Using intermittent CT fluoroscopy, a 17 gauge introducer needle was  advanced towards the identified lesion. During needle positioning, the location of the liver was noted to change result  of the patient's breathing, and this biopsy target was abandoned due to risk of injury to adjacent structures. Therefore, the biopsy target was changed to the inferior posterior right hepatic lesion. Again using CT guidance, a 17 gauge introducer needle was advanced towards the identified lesion using anatomic landmarks. Subsequently, core needle biopsy was performed using an 18 gauge core biopsy device x4 total passes. Specimens were submitted in formalin to pathology for further handling. Limited postprocedure imaging demonstrated no complicating feature. The patient tolerated the procedure well, and was transferred to recovery in stable condition. IMPRESSION: Successful CT-guided core needle biopsy of liver lesion in the inferior posterior right hepatic lobe. Technically challenging biopsy due to poor differentiation of the liver lesion from adjacent liver parenchyma and reliance on anatomic landmarks. Electronically Signed   By: Albin Felling M.D.   On: 09/28/2022 12:38    ASSESSMENT & PLAN:   No problem-specific Assessment & Plan notes found for this encounter.            All questions were answered. The patient knows to call the clinic with any problems, questions or concerns.    [name], MD 10/23/2022 9:07 AM

## 2022-10-24 ENCOUNTER — Encounter: Payer: Self-pay | Admitting: Nurse Practitioner

## 2022-10-24 ENCOUNTER — Encounter: Payer: Self-pay | Admitting: Internal Medicine

## 2022-10-24 LAB — CEA: CEA: 6.7 ng/mL — ABNORMAL HIGH (ref 0.0–4.7)

## 2022-10-25 ENCOUNTER — Telehealth: Payer: Self-pay | Admitting: *Deleted

## 2022-10-25 NOTE — Telephone Encounter (Signed)
Patient called informing us that [facility] cannot get the Neulasta injection to him in time to get it tomorrow and he is asking what to do. Please advise

## 2022-10-26 ENCOUNTER — Encounter: Payer: Self-pay | Admitting: Internal Medicine

## 2022-10-26 ENCOUNTER — Encounter: Payer: Self-pay | Admitting: Nurse Practitioner

## 2022-10-26 ENCOUNTER — Inpatient Hospital Stay: Payer: 59

## 2022-10-26 ENCOUNTER — Other Ambulatory Visit: Payer: Self-pay | Admitting: Internal Medicine

## 2022-10-26 DIAGNOSIS — Z5112 Encounter for antineoplastic immunotherapy: Secondary | ICD-10-CM | POA: Diagnosis not present

## 2022-10-26 DIAGNOSIS — C3432 Malignant neoplasm of lower lobe, left bronchus or lung: Secondary | ICD-10-CM

## 2022-10-26 DIAGNOSIS — D702 Other drug-induced agranulocytosis: Secondary | ICD-10-CM

## 2022-10-26 DIAGNOSIS — C7931 Secondary malignant neoplasm of brain: Secondary | ICD-10-CM

## 2022-10-26 MED ORDER — PEGFILGRASTIM INJECTION 6 MG/0.6ML ~~LOC~~: 6.0000 mg | PREFILLED_SYRINGE | Freq: Once | SUBCUTANEOUS | Status: DC

## 2022-10-26 MED ORDER — PEGFILGRASTIM INJECTION 6 MG/0.6ML ~~LOC~~
6.0000 mg | PREFILLED_SYRINGE | Freq: Once | SUBCUTANEOUS | Status: AC
  Administered 2022-10-26: 6 mg via SUBCUTANEOUS
  Filled 2022-10-26: qty 0.6

## 2022-10-26 NOTE — Telephone Encounter (Signed)
Please schedule patient for Neulasta injection today and notify patient of appt details.

## 2022-10-30 ENCOUNTER — Encounter: Payer: Self-pay | Admitting: Nurse Practitioner

## 2022-10-30 ENCOUNTER — Inpatient Hospital Stay: Payer: 59

## 2022-10-30 ENCOUNTER — Inpatient Hospital Stay (HOSPITAL_BASED_OUTPATIENT_CLINIC_OR_DEPARTMENT_OTHER): Payer: 59 | Admitting: Nurse Practitioner

## 2022-10-30 VITALS — BP 126/90 | HR 98 | Temp 97.4°F | Wt 218.0 lb

## 2022-10-30 DIAGNOSIS — D702 Other drug-induced agranulocytosis: Secondary | ICD-10-CM

## 2022-10-30 DIAGNOSIS — C7931 Secondary malignant neoplasm of brain: Secondary | ICD-10-CM

## 2022-10-30 DIAGNOSIS — Z5112 Encounter for antineoplastic immunotherapy: Secondary | ICD-10-CM | POA: Diagnosis not present

## 2022-10-30 DIAGNOSIS — Z09 Encounter for follow-up examination after completed treatment for conditions other than malignant neoplasm: Secondary | ICD-10-CM | POA: Diagnosis not present

## 2022-10-30 DIAGNOSIS — T451X5A Adverse effect of antineoplastic and immunosuppressive drugs, initial encounter: Secondary | ICD-10-CM

## 2022-10-30 DIAGNOSIS — C3432 Malignant neoplasm of lower lobe, left bronchus or lung: Secondary | ICD-10-CM | POA: Diagnosis not present

## 2022-10-30 DIAGNOSIS — D6959 Other secondary thrombocytopenia: Secondary | ICD-10-CM

## 2022-10-30 DIAGNOSIS — D6481 Anemia due to antineoplastic chemotherapy: Secondary | ICD-10-CM

## 2022-10-30 LAB — CBC WITH DIFFERENTIAL/PLATELET
Abs Immature Granulocytes: 0.03 10*3/uL (ref 0.00–0.07)
Basophils Absolute: 0.1 10*3/uL (ref 0.0–0.1)
Basophils Relative: 5 %
Eosinophils Absolute: 0.1 10*3/uL (ref 0.0–0.5)
Eosinophils Relative: 5 %
HCT: 34.5 % — ABNORMAL LOW (ref 39.0–52.0)
Hemoglobin: 11.4 g/dL — ABNORMAL LOW (ref 13.0–17.0)
Immature Granulocytes: 3 %
Lymphocytes Relative: 55 %
Lymphs Abs: 0.6 10*3/uL — ABNORMAL LOW (ref 0.7–4.0)
MCH: 30.6 pg (ref 26.0–34.0)
MCHC: 33 g/dL (ref 30.0–36.0)
MCV: 92.7 fL (ref 80.0–100.0)
Monocytes Absolute: 0.2 10*3/uL (ref 0.1–1.0)
Monocytes Relative: 22 %
Neutro Abs: 0.1 10*3/uL — CL (ref 1.7–7.7)
Neutrophils Relative %: 10 %
Platelets: 78 10*3/uL — ABNORMAL LOW (ref 150–400)
RBC: 3.72 MIL/uL — ABNORMAL LOW (ref 4.22–5.81)
RDW: 20.1 % — ABNORMAL HIGH (ref 11.5–15.5)
WBC: 1.1 10*3/uL — CL (ref 4.0–10.5)
nRBC: 0 % (ref 0.0–0.2)

## 2022-10-30 LAB — COMPREHENSIVE METABOLIC PANEL
ALT: 80 U/L — ABNORMAL HIGH (ref 0–44)
AST: 65 U/L — ABNORMAL HIGH (ref 15–41)
Albumin: 3.5 g/dL (ref 3.5–5.0)
Alkaline Phosphatase: 157 U/L — ABNORMAL HIGH (ref 38–126)
Anion gap: 8 (ref 5–15)
BUN: 12 mg/dL (ref 6–20)
CO2: 25 mmol/L (ref 22–32)
Calcium: 8.1 mg/dL — ABNORMAL LOW (ref 8.9–10.3)
Chloride: 100 mmol/L (ref 98–111)
Creatinine, Ser: 0.79 mg/dL (ref 0.61–1.24)
GFR, Estimated: >60 mL/min (ref >60)
Glucose, Bld: 121 mg/dL — ABNORMAL HIGH (ref 70–99)
Potassium: 3.4 mmol/L — ABNORMAL LOW (ref 3.5–5.1)
Sodium: 133 mmol/L — ABNORMAL LOW (ref 135–145)
Total Bilirubin: 0.4 mg/dL (ref 0.3–1.2)
Total Protein: 6.9 g/dL (ref 6.5–8.1)

## 2022-10-30 MED ORDER — SODIUM CHLORIDE 0.9% FLUSH
10.0000 mL | Freq: Once | INTRAVENOUS | Status: AC
  Administered 2022-10-30: 10 mL via INTRAVENOUS
  Filled 2022-10-30: qty 10

## 2022-10-30 MED ORDER — HEPARIN SOD (PORK) LOCK FLUSH 100 UNIT/ML IV SOLN
500.0000 [IU] | Freq: Once | INTRAVENOUS | Status: AC
  Administered 2022-10-30: 500 [IU] via INTRAVENOUS
  Filled 2022-10-30: qty 5

## 2022-10-30 NOTE — Progress Notes (Signed)
[facility] NOTE  Patient Care Team: Pcp, No as PCP - General [name], RN as Oncology Nurse Navigator [name], MD as Consulting Physician (Internal Medicine)  CHIEF COMPLAINTS/PURPOSE OF CONSULTATION: Lung cancer  #  Oncology History Overview Note  IMPRESSION: 1. Patchy nodular fat stranding throughout the anterior left upper quadrant peritoneal fat, nonspecific, cannot exclude peritoneal carcinomatosis. Dedicated CT abdomen/pelvis with oral and IV contrast recommended for further evaluation. 2. Dense patchy consolidation replacing much of the left lower lung lobe, appearing masslike in the superior segment left lower lobe, with associated bulging of the left major fissure and associated left lower lobe volume loss. Fine nodularity throughout both lungs with an upper lobe predominance. Asymmetric left upper lobe interlobular septal thickening. These findings are indeterminate, with differential including multilobar pneumonia, sarcoidosis or a neoplastic process. The persistence on radiographs back to 01/20/2021 despite antibiotic therapy make sarcoidosis or a neoplastic process more likely. Pulmonology consultation suggested for consideration of bronchoscopic evaluation. 3. Small dependent left pleural effusion. 4. Mild mediastinal lymphadenopathy, nonspecific. 5. Subacute healing lateral right sixth rib fracture.   DIAGNOSIS:  A. LUNG, LEFT LOWER LOBE; ENB-ASSISTED BIOPSY:  - NON-SMALL CELL CARCINOMA, FAVOR ADENOCARCINOMA.  - FOREIGN MATERIAL SUGGESTIVE OF ASPIRATION.   # EGFR MUTATED: 42 del- June 28th, 2022- [name]. [limited]  # Asymptomatic multiple brain mets subcentimeter asymptomatic -JUNE 29th, 2023- Numerous metastatic lesions in the supratentorial brain-increase in number also conspicuity compared to January 2023.  Pre-SBRT MRI brain AUG 18th, 2023-  Eighteen small enhancing brain metastases (punctate to 11 mm); were all  present on 03/28/2021-s/p SBRT SEP 2023- [[place]]  # SEP-OCT-worsening left-sided pleural effusion-status post paracentesis; exudative; Cytology negative- ?  Progressive disease.  # DEC 2023- CARBO-TAXOL-BEV+ATEZO   Cancer of lower lobe of left lung ([place])  03/23/2021 Initial Diagnosis   Cancer of lower lobe of left lung ([place])   03/29/2021 Cancer Staging   Staging form: Lung, AJCC 8th Edition - Clinical: Stage IVB (cT3, cN3, pM1c) - Signed by [name], MD on 03/29/2021   03/30/2021 - 03/30/2021 Chemotherapy   Patient is on Treatment Plan : LUNG NSCLC Pemetrexed ([place]) / Carboplatin q21d x 1 cycles     08/17/2022 -  Chemotherapy   Patient is on Treatment Plan : LUNG Atezolizumab + Bevacizumab + Carboplatin + Paclitaxel q21d Induction x 4 cycles / Atezolizumab + Bevacizumab q21d Maintenance      HISTORY OF PRESENTING ILLNESS: Ambulating independently.   [name] 42 y.o. 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 He feels well. Has some numbness of the pads of his fingertips. Not impairing ADLs. No pain.    Review of Systems  Constitutional:  Negative for chills, fever, malaise/fatigue and weight loss.  HENT:  Negative for hearing loss, nosebleeds, sore throat and tinnitus.   Eyes:  Negative for blurred vision and double vision.  Respiratory:  Negative for cough, hemoptysis, shortness of breath and wheezing.   Cardiovascular:  Negative for chest pain, palpitations and leg swelling.  Gastrointestinal:  Negative for abdominal pain, blood in stool, constipation, diarrhea, melena, nausea and vomiting.  Genitourinary:  Negative for dysuria and urgency.  Musculoskeletal:  Negative for back pain, falls, joint pain and myalgias.  Skin:  Negative for itching and  rash.  Neurological:  Positive  for sensory change. Negative for dizziness, tingling, loss of consciousness, weakness and headaches.  Endo/Heme/Allergies:  Negative for environmental allergies. Does not bruise/bleed easily.  Psychiatric/Behavioral:  Negative for depression. The patient is not nervous/anxious and does not have insomnia.      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    SURGICAL HISTORY: Past Surgical History:  Procedure Laterality Date   FRACTURE SURGERY     broke femur when he was 6 years   PORTA CATH INSERTION N/A 03/28/2021   Procedure: PORTA CATH INSERTION;  Surgeon: Katha Cabal, MD;  Location: [facility];  Service: Cardiovascular;  Laterality: N/A;   VIDEO BRONCHOSCOPY WITH ENDOBRONCHIAL NAVIGATION N/A 03/15/2021   Procedure: VIDEO BRONCHOSCOPY WITH ENDOBRONCHIAL NAVIGATION;  Surgeon: Ottie Glazier, MD;  Location: [facility];  Service: Thoracic;  Laterality: N/A;   VIDEO BRONCHOSCOPY WITH ENDOBRONCHIAL ULTRASOUND N/A 03/15/2021   Procedure: VIDEO BRONCHOSCOPY WITH ENDOBRONCHIAL ULTRASOUND;  Surgeon: Ottie Glazier, MD;  Location: [facility];  Service: Thoracic;  Laterality: N/A;    SOCIAL HISTORY: Social History   Socioeconomic History   Marital status: Married    Spouse name: [name]   Number of children: 2   Years of education: Not on file   Highest education level: Not on file  Occupational History   Not on file  Tobacco Use   Smoking status: Never   Smokeless tobacco: Former    Types: Snuff, Chew  Vaping Use   Vaping Use: Never used  Substance and Sexual Activity   Alcohol use: Never   Drug use: Never   Sexual activity: Yes  Other Topics Concern   Not on file  Social History Narrative   [place]ives in [place]; with wife; 2 daughters[12 and 22]; never smoked; rare alcohol. 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   Social  Determinants of Health   Financial Resource Strain: Low Risk  (05/10/2022)   Overall Financial Resource Strain (CARDIA)    Difficulty of Paying Living Expenses: Not hard at all  Food Insecurity: No Food Insecurity (05/10/2022)   Hunger Vital Sign    Worried About Running Out of Food in the Last Year: Never true    Ran Out of Food in the Last Year: Never true  Transportation Needs: No Transportation Needs (03/16/2022)   PRAPARE - Hydrologist (Medical): No    Lack of Transportation (Non-Medical): No  Physical Activity: Not on file  Stress: Not on file  Social Connections: Socially Integrated (05/10/2022)   Social Connection and Isolation Panel [NHANES]    Frequency of Communication with Friends and Family: More than three times a week    Frequency of Social Gatherings with Friends and Family: More than three times a week    Attends Religious Services: More than 4 times per year    Active Member of [employer] or Organizations: Yes    Attends Music therapist: More than 4 times per year    Marital Status: Married  Human resources officer Violence: Not on file    FAMILY HISTORY: Family History  Problem Relation Age of Onset   High Cholesterol Mother    Hypertension Father    Cancer Father    Lung cancer Father    Skin cancer Father     ALLERGIES:  is allergic to codeine.  MEDICATIONS:  Current Outpatient Medications  Medication Sig Dispense Refill   apixaban (ELIQUIS) 5 MG TABS tablet Take 1 tablet (5 mg total) by mouth 2 (two) times daily. 60 tablet 2   calcium carbonate (TUMS EX) 750 MG chewable tablet Chew 1 tablet by mouth daily. (Patient not taking: Reported on 08/27/2022)     dronabinol (MARINOL) 2.5 MG capsule Take 1 capsule (2.5 mg total) by mouth 2 (two) times daily before lunch and supper. 60 capsule 0   folic acid (FOLVITE) 1 MG tablet TAKE 1 TABLET BY MOUTH EVERY DAY 90 tablet 1   gabapentin (NEURONTIN) 100 MG capsule Take 1 capsule (100 mg  total) by mouth at bedtime. (Patient not taking: Reported on 10/09/2022) 30 capsule 1   lidocaine-prilocaine (EMLA) cream Apply 1 application topically as needed. 30 g 0   lidocaine-prilocaine (EMLA) cream Apply 1 Application topically as needed. 30 g 0   loratadine (CLARITIN) 10 MG tablet Take 10 mg by mouth daily. For 5 days after the treatment.     metoprolol succinate (TOPROL-XL) 50 MG 24 hr tablet Take 50 mg by mouth daily. Take with or immediately following a meal.     omeprazole (PRILOSEC) 20 MG capsule Take 1 capsule (20 mg total) by mouth daily. 30 capsule 2   ondansetron (ZOFRAN) 8 MG tablet One pill every 8 hours as needed for nausea/vomitting. (Patient not taking: Reported on 10/09/2022) 40 tablet 1   oxyCODONE (OXY IR/ROXICODONE) 5 MG immediate release tablet Take 1 tablet (5 mg total) by mouth every 4 (four) hours as needed for severe pain. (Patient not taking: Reported on 10/09/2022) 30 tablet 0   pegfilgrastim (NEULASTA) 6 MG/0.6ML injection Inject 6 mg into the skin once.     potassium chloride SA (KLOR-CON M) 20 MEQ tablet Take 1 tablet (20 mEq total) by mouth daily. (Patient not taking: Reported on 10/09/2022) 14 tablet 0   prochlorperazine (COMPAZINE) 10 MG tablet Take 1 tablet (10 mg total) by mouth every 6 (six) hours as needed for nausea or vomiting. (Patient not taking: Reported on 10/09/2022) 40 tablet 1   sertraline (ZOLOFT) 50 MG tablet TAKE 1/2 TABLET BY MOUTH DAILY 30 tablet 2   traMADol (ULTRAM) 50 MG tablet Take 50 mg by mouth every 6 (six) hours as needed for moderate pain or severe pain. (Patient not taking: Reported on 10/09/2022)     No current facility-administered medications for this visit.   Facility-Administered Medications Ordered in Other Visits  Medication Dose Route Frequency Provider Last Rate Last Admin   heparin lock flush 100 UNIT/ML injection            heparin lock flush 100 UNIT/ML injection            heparin lock flush 100 unit/mL  500 Units  Intravenous Once Charlaine Dalton R, MD       sodium chloride flush (NS) 0.9 % injection 10 mL  10 mL Intravenous PRN [name], MD   10 mL at 07/28/21 0852    .  PHYSICAL EXAMINATION: ECOG PERFORMANCE STATUS: 1 - Symptomatic but completely ambulatory  Vitals:   10/30/22 1327  BP: (!) 126/90  Pulse: 98  Temp: (!) 97.4 F (36.3 C)   Filed Weights   10/30/22 1327  Weight: 218 lb (98.9 kg)    Physical Exam Constitutional:      Appearance: He is not ill-appearing.  Eyes:     General: No scleral icterus. Cardiovascular:     Rate and  Rhythm: Normal rate and regular rhythm.  Pulmonary:     Effort: Pulmonary effort is normal.  Abdominal:     General: There is no distension.     Palpations: Abdomen is soft.     Tenderness: There is no abdominal tenderness. There is no guarding.  Musculoskeletal:        General: No deformity.     Right lower leg: No edema.     Left lower leg: No edema.  Lymphadenopathy:     Cervical: No cervical adenopathy.  Skin:    General: Skin is warm and dry.  Neurological:     Mental Status: He is alert and oriented to person, place, and time. Mental status is at baseline.  Psychiatric:        Mood and Affect: Mood normal.        Behavior: Behavior normal.     LABORATORY DATA:  I have reviewed the data as listed Lab Results  Component Value Date   WBC 1.1 (LL) 10/30/2022   HGB 11.4 (L) 10/30/2022   HCT 34.5 (L) 10/30/2022   MCV 92.7 10/30/2022   PLT 78 (L) 10/30/2022   Recent Labs    10/16/22 1256 10/23/22 0838 10/30/22 1313  NA 137 137 133*  K 4.2 4.2 3.4*  CL 107 106 100  CO2 23 26 25   GLUCOSE 98 102* 121*  BUN 10 9 12   CREATININE 1.06 0.88 0.79  CALCIUM 8.3* 8.4* 8.1*  GFRNONAA >60 >60 >60  PROT 7.3 6.6 6.9  ALBUMIN 3.6 3.4* 3.5  AST 64* 65* 65*  ALT 88* 75* 80*  ALKPHOS 218* 179* 157*  BILITOT 0.4 0.5 0.4     RADIOGRAPHIC STUDIES: I have personally reviewed the radiological images as listed and agreed  with the findings in the report. No results found.  ASSESSMENT & PLAN:   No problem-specific Assessment & Plan notes found for this encounter.  1.Stage IV adenocarcinoma of the lung-left lower lobe lung cancer.  EGFR-19 deletion positive.  On osimertinib 80 mg daily since 04/11/2021.  Progressed based on 08/15/2022 PET.  Repeat NGS of liver biopsy was positive for EGFR mutation but otherwise negative for any novel mutations.  Status post second opinion at [facility]-Dr. Durenda Hurt.  Currently receiving carboplatin-Taxol-Avastin plus atezolizumab.  Currently status post cycle 4 on 10/23/22 with neulasta on 10/26/22. Tolerating well.  2. Chemotherapy follow up- tolerating well. Hold iv fluids today.  3.  Neutropenia-due to chemotherapy. Wbc 1.1. ANC 0.1. Received neulasta on 1/12. No additional GCSF recommended.  4. Thrombocytopenia- plt 78. Consistent with chemotherapy.  5. Anemia- Hemoglobin 11.4. Secondary to chemotherapy.  6. Abnormal LFTs- secondary to disease in liver. Alk phos improving. ALT and AST stable.  7. Hypocalcemia- Ca 8.1. Normal albumin. Recommend increasing calcium intake. Continue vitamin d.  8. Bone metastases- clinically stable.  9. Left lung PE- on eliquis 5 mg BID. Stable.  10. Brain metastases- Clinically asymptomatic. Plan to repeat brain mri in February.  11. Port-a-cath: deaccess port   DISPOSITION:  Deaccess port Follow up as scheduled -la  All questions were answered. The patient knows to call the clinic with any problems, questions or concerns.   Verlon Au, NP 10/30/2022

## 2022-11-02 ENCOUNTER — Other Ambulatory Visit: Payer: Self-pay

## 2022-11-06 ENCOUNTER — Inpatient Hospital Stay: Payer: 59

## 2022-11-06 ENCOUNTER — Inpatient Hospital Stay (HOSPITAL_BASED_OUTPATIENT_CLINIC_OR_DEPARTMENT_OTHER): Payer: 59 | Admitting: Nurse Practitioner

## 2022-11-06 ENCOUNTER — Encounter: Payer: Self-pay | Admitting: Nurse Practitioner

## 2022-11-06 VITALS — BP 130/99 | HR 101 | Temp 97.4°F | Wt 221.0 lb

## 2022-11-06 DIAGNOSIS — Z09 Encounter for follow-up examination after completed treatment for conditions other than malignant neoplasm: Secondary | ICD-10-CM | POA: Diagnosis not present

## 2022-11-06 DIAGNOSIS — Z5112 Encounter for antineoplastic immunotherapy: Secondary | ICD-10-CM | POA: Diagnosis not present

## 2022-11-06 DIAGNOSIS — C3432 Malignant neoplasm of lower lobe, left bronchus or lung: Secondary | ICD-10-CM

## 2022-11-06 LAB — COMPREHENSIVE METABOLIC PANEL
ALT: 76 U/L — ABNORMAL HIGH (ref 0–44)
AST: 61 U/L — ABNORMAL HIGH (ref 15–41)
Albumin: 3.5 g/dL (ref 3.5–5.0)
Alkaline Phosphatase: 202 U/L — ABNORMAL HIGH (ref 38–126)
Anion gap: 9 (ref 5–15)
BUN: 16 mg/dL (ref 6–20)
CO2: 23 mmol/L (ref 22–32)
Calcium: 8.5 mg/dL — ABNORMAL LOW (ref 8.9–10.3)
Chloride: 103 mmol/L (ref 98–111)
Creatinine, Ser: 1 mg/dL (ref 0.61–1.24)
GFR, Estimated: >60 mL/min (ref >60)
Glucose, Bld: 131 mg/dL — ABNORMAL HIGH (ref 70–99)
Potassium: 4 mmol/L (ref 3.5–5.1)
Sodium: 135 mmol/L (ref 135–145)
Total Bilirubin: 0.2 mg/dL — ABNORMAL LOW (ref 0.3–1.2)
Total Protein: 6.9 g/dL (ref 6.5–8.1)

## 2022-11-06 LAB — CBC WITH DIFFERENTIAL/PLATELET
Abs Immature Granulocytes: 0.35 10*3/uL — ABNORMAL HIGH (ref 0.00–0.07)
Basophils Absolute: 0.1 10*3/uL (ref 0.0–0.1)
Basophils Relative: 1 %
Eosinophils Absolute: 0 10*3/uL (ref 0.0–0.5)
Eosinophils Relative: 0 %
HCT: 33.9 % — ABNORMAL LOW (ref 39.0–52.0)
Hemoglobin: 11.6 g/dL — ABNORMAL LOW (ref 13.0–17.0)
Immature Granulocytes: 3 %
Lymphocytes Relative: 9 %
Lymphs Abs: 1 10*3/uL (ref 0.7–4.0)
MCH: 31.5 pg (ref 26.0–34.0)
MCHC: 34.2 g/dL (ref 30.0–36.0)
MCV: 92.1 fL (ref 80.0–100.0)
Monocytes Absolute: 0.8 10*3/uL (ref 0.1–1.0)
Monocytes Relative: 8 %
Neutro Abs: 8.2 10*3/uL — ABNORMAL HIGH (ref 1.7–7.7)
Neutrophils Relative %: 79 %
Platelets: 106 10*3/uL — ABNORMAL LOW (ref 150–400)
RBC: 3.68 MIL/uL — ABNORMAL LOW (ref 4.22–5.81)
RDW: 22.1 % — ABNORMAL HIGH (ref 11.5–15.5)
WBC: 10.5 10*3/uL (ref 4.0–10.5)
nRBC: 0.2 % (ref 0.0–0.2)

## 2022-11-06 MED ORDER — HEPARIN SOD (PORK) LOCK FLUSH 100 UNIT/ML IV SOLN
500.0000 [IU] | Freq: Once | INTRAVENOUS | Status: AC
  Administered 2022-11-06: 500 [IU] via INTRAVENOUS
  Filled 2022-11-06: qty 5

## 2022-11-06 MED ORDER — SODIUM CHLORIDE 0.9% FLUSH
10.0000 mL | Freq: Once | INTRAVENOUS | Status: AC
  Administered 2022-11-06: 10 mL via INTRAVENOUS
  Filled 2022-11-06: qty 10

## 2022-11-06 NOTE — Progress Notes (Signed)
[facility] CONSULT NOTE  Patient Care Team: Pcp, No as PCP - General Nathan Buff, RN as Oncology Nurse Navigator Nathan Coder, MD as Consulting Physician (Internal Medicine)  CHIEF COMPLAINTS/PURPOSE OF CONSULTATION: Lung cancer  #  Oncology History Overview Note  IMPRESSION: 1. Patchy nodular fat stranding throughout the anterior left upper quadrant peritoneal fat, nonspecific, cannot exclude peritoneal carcinomatosis. Dedicated CT abdomen/pelvis with oral and IV contrast recommended for further evaluation. 2. Dense patchy consolidation replacing much of the left lower lung lobe, appearing masslike in the superior segment left lower lobe, with associated bulging of the left major fissure and associated left lower lobe volume loss. Fine nodularity throughout both lungs with an upper lobe predominance. Asymmetric left upper lobe interlobular septal thickening. These findings are indeterminate, with differential including multilobar pneumonia, sarcoidosis or a neoplastic process. The persistence on radiographs back to 01/20/2021 despite antibiotic therapy make sarcoidosis or a neoplastic process more likely. Pulmonology consultation suggested for consideration of bronchoscopic evaluation. 3. Small dependent left pleural effusion. 4. Mild mediastinal lymphadenopathy, nonspecific. 5. Subacute healing lateral right sixth rib fracture.   DIAGNOSIS:  A. LUNG, LEFT LOWER LOBE; ENB-ASSISTED BIOPSY:  - NON-SMALL CELL CARCINOMA, FAVOR ADENOCARCINOMA.  - FOREIGN MATERIAL SUGGESTIVE OF ASPIRATION.   # EGFR MUTATED: 42 del- June 28th, 2022- [name]. [limited]  # Asymptomatic multiple brain mets subcentimeter asymptomatic -JUNE 29th, 2023- Numerous metastatic lesions in the supratentorial brain-increase in number also conspicuity compared to January 2023.  Pre-SBRT MRI brain AUG 18th, 2023-  Eighteen small enhancing brain metastases (punctate to 11 mm); were all  present on 03/28/2021-s/p SBRT SEP 2023- [[place]]  # SEP-OCT-worsening left-sided pleural effusion-status post paracentesis; exudative; Cytology negative- ?  Progressive disease.  # DEC 2023- CARBO-TAXOL-BEV+ATEZO   Cancer of lower lobe of left lung (HCC)  03/23/2021 Initial Diagnosis   Cancer of lower lobe of left lung (HCC)   03/29/2021 Cancer Staging   Staging form: Lung, AJCC 8th Edition - Clinical: Stage IVB (cT3, cN3, pM1c) - Signed by Nathan Coder, MD on 03/29/2021   03/30/2021 - 03/30/2021 Chemotherapy   Patient is on Treatment Plan : LUNG NSCLC Pemetrexed (Alimta) / Carboplatin q21d x 1 cycles     08/17/2022 -  Chemotherapy   Patient is on Treatment Plan : LUNG Atezolizumab + Bevacizumab + Carboplatin + Paclitaxel q21d Induction x 4 cycles / Atezolizumab + Bevacizumab q21d Maintenance      HISTORY OF PRESENTING ILLNESS: Ambulating independently.  With wife.   [name] 42 y.o.  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 He feels well. Feels like he's breathing better, improved stamina. Does not feel like he needs IV fluids.  Complains of numbness of the finger tips. No fevers or chills. No nausea or vomiting. Questions clinical trials that may be of benefit to him.   Review of Systems  Constitutional:  Negative for chills, fever, malaise/fatigue and weight loss.  HENT:  Negative for hearing loss, nosebleeds, sore throat and tinnitus.   Eyes:  Negative for blurred vision and double vision.  Respiratory:  Negative for cough, hemoptysis, shortness of breath and wheezing.   Cardiovascular:  Negative for chest pain, palpitations and leg swelling.  Gastrointestinal:  Negative for abdominal pain, blood in stool, constipation, diarrhea, melena, nausea and  vomiting.  Genitourinary:  Negative for dysuria and urgency.  Musculoskeletal:  Negative for back pain, falls, joint pain and myalgias.  Skin:  Negative for itching and rash.  Neurological:  Positive for sensory change. Negative for dizziness, tingling, loss of consciousness, weakness and headaches.  Endo/Heme/Allergies:  Negative for environmental allergies. Does not bruise/bleed easily.  Psychiatric/Behavioral:  Negative for depression. The patient is not nervous/anxious and does not have insomnia.      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    SURGICAL HISTORY: Past Surgical History:  Procedure Laterality Date   FRACTURE SURGERY     broke femur when he was 6 years   PORTA CATH INSERTION N/A 03/28/2021   Procedure: PORTA CATH INSERTION;  Surgeon: Renford Dills, MD;  Location: [facility] INVASIVE CV LAB;  Service: Cardiovascular;  Laterality: N/A;   VIDEO BRONCHOSCOPY WITH ENDOBRONCHIAL NAVIGATION N/A 03/15/2021   Procedure: VIDEO BRONCHOSCOPY WITH ENDOBRONCHIAL NAVIGATION;  Surgeon: Vida Rigger, MD;  Location: [facility] ORS;  Service: Thoracic;  Laterality: N/A;   VIDEO BRONCHOSCOPY WITH ENDOBRONCHIAL ULTRASOUND N/A 03/15/2021   Procedure: VIDEO BRONCHOSCOPY WITH ENDOBRONCHIAL ULTRASOUND;  Surgeon: Vida Rigger, MD;  Location: [facility] ORS;  Service: Thoracic;  Laterality: N/A;    SOCIAL HISTORY: Social History   Socioeconomic History   Marital status: Married    Spouse name: [name]   Number of children: 2   Years of education: Not on file   Highest education level: Not on file  Occupational History   Not on file  Tobacco Use   Smoking status: Never   Smokeless tobacco: Former    Types: Snuff, Chew  Vaping Use   Vaping Use: Never used  Substance and Sexual Activity   Alcohol use: Never   Drug use: Never   Sexual activity: Yes  Other Topics  Concern   Not on file  Social History Narrative   [place]ives in [place]; with wife; 2 daughters[12 and 22]; never smoked; rare alcohol. 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 Dog and cat.   Social Determinants of Health   Financial Resource Strain: Low Risk  (05/10/2022)   Overall Financial Resource Strain (CARDIA)    Difficulty of Paying Living Expenses: Not hard at all  Food Insecurity: No Food Insecurity (05/10/2022)   Hunger Vital Sign    Worried About Running Out of Food in the Last Year: Never true    Ran Out of Food in the Last Year: Never true  Transportation Needs: No Transportation Needs (03/16/2022)   PRAPARE - Administrator, Civil Service (Medical): No    Lack of Transportation (Non-Medical): No  Physical Activity: Not on file  Stress: Not on file  Social Connections: Socially Integrated (05/10/2022)   Social Connection and Isolation Panel [NHANES]    Frequency of Communication with Friends and Family: More than three times a week    Frequency of Social Gatherings with Friends and Family: More than three times a week    Attends Religious Services: More than 4 times per year    Active Member of [employer] or Organizations: Yes    Attends Engineer, structural: More than 4 times per year    Marital Status: Married  Catering manager Violence: Not on file    FAMILY HISTORY: Family History  Problem Relation Age of Onset   High Cholesterol Mother    Hypertension  Father    Cancer Father    Lung cancer Father    Skin cancer Father     ALLERGIES:  is allergic to codeine.  MEDICATIONS:  Current Outpatient Medications  Medication Sig Dispense Refill   apixaban (ELIQUIS) 5 MG TABS tablet Take 1 tablet (5 mg total) by mouth 2 (two) times daily. 60 tablet 2   dronabinol (MARINOL) 2.5 MG capsule Take 1 capsule (2.5 mg total) by mouth 2 (two) times daily before lunch and supper. 60 capsule 0   folic acid (FOLVITE) 1 MG tablet TAKE 1 TABLET BY MOUTH EVERY DAY 90 tablet 1    lidocaine-prilocaine (EMLA) cream Apply 1 application topically as needed. 30 g 0   lidocaine-prilocaine (EMLA) cream Apply 1 Application topically as needed. 30 g 0   loratadine (CLARITIN) 10 MG tablet Take 10 mg by mouth daily. For 5 days after the treatment.     metoprolol succinate (TOPROL-XL) 50 MG 24 hr tablet Take 50 mg by mouth daily. Take with or immediately following a meal.     omeprazole (PRILOSEC) 20 MG capsule Take 1 capsule (20 mg total) by mouth daily. 30 capsule 2   pegfilgrastim (NEULASTA) 6 MG/0.6ML injection Inject 6 mg into the skin once.     sertraline (ZOLOFT) 50 MG tablet TAKE 1/2 TABLET BY MOUTH DAILY 30 tablet 2   calcium carbonate (TUMS EX) 750 MG chewable tablet Chew 1 tablet by mouth daily. (Patient not taking: Reported on 08/27/2022)     gabapentin (NEURONTIN) 100 MG capsule Take 1 capsule (100 mg total) by mouth at bedtime. (Patient not taking: Reported on 10/09/2022) 30 capsule 1   ondansetron (ZOFRAN) 8 MG tablet One pill every 8 hours as needed for nausea/vomitting. (Patient not taking: Reported on 10/09/2022) 40 tablet 1   oxyCODONE (OXY IR/ROXICODONE) 5 MG immediate release tablet Take 1 tablet (5 mg total) by mouth every 4 (four) hours as needed for severe pain. (Patient not taking: Reported on 10/09/2022) 30 tablet 0   potassium chloride SA (KLOR-CON M) 20 MEQ tablet Take 1 tablet (20 mEq total) by mouth daily. (Patient not taking: Reported on 10/09/2022) 14 tablet 0   prochlorperazine (COMPAZINE) 10 MG tablet Take 1 tablet (10 mg total) by mouth every 6 (six) hours as needed for nausea or vomiting. (Patient not taking: Reported on 10/09/2022) 40 tablet 1   traMADol (ULTRAM) 50 MG tablet Take 50 mg by mouth every 6 (six) hours as needed for moderate pain or severe pain. (Patient not taking: Reported on 10/09/2022)     No current facility-administered medications for this visit.   Facility-Administered Medications Ordered in Other Visits  Medication Dose Route  Frequency Provider Last Rate Last Admin   heparin lock flush 100 UNIT/ML injection            heparin lock flush 100 UNIT/ML injection            heparin lock flush 100 unit/mL  500 Units Intravenous Once Louretta Shorten R, MD       sodium chloride flush (NS) 0.9 % injection 10 mL  10 mL Intravenous PRN Nathan Coder, MD   10 mL at 07/28/21 0852    .  PHYSICAL EXAMINATION: ECOG PERFORMANCE STATUS: 1 - Symptomatic but completely ambulatory  Vitals:   11/06/22 0954  BP: (!) 130/99  Pulse: (!) 101  Temp: (!) 97.4 F (36.3 C)  SpO2: 99%   Filed Weights   11/06/22 0954  Weight: 221 lb (100.2 kg)  Physical Exam Constitutional:      Appearance: He is not ill-appearing.  Eyes:     General: No scleral icterus.    Conjunctiva/sclera: Conjunctivae normal.  Cardiovascular:     Rate and Rhythm: Normal rate and regular rhythm.  Pulmonary:     Effort: Pulmonary effort is normal.  Abdominal:     General: There is no distension.     Palpations: Abdomen is soft.     Tenderness: There is no abdominal tenderness. There is no guarding.  Musculoskeletal:        General: No deformity.     Right lower leg: No edema.     Left lower leg: No edema.  Lymphadenopathy:     Cervical: No cervical adenopathy.  Skin:    General: Skin is warm and dry.  Neurological:     Mental Status: He is alert and oriented to person, place, and time. Mental status is at baseline.  Psychiatric:        Mood and Affect: Mood normal.        Behavior: Behavior normal.    LABORATORY DATA:  I have reviewed the data as listed Lab Results  Component Value Date   WBC 10.5 11/06/2022   HGB 11.6 (L) 11/06/2022   HCT 33.9 (L) 11/06/2022   MCV 92.1 11/06/2022   PLT 106 (L) 11/06/2022   Recent Labs    10/16/22 1256 10/23/22 0838 10/30/22 1313  NA 137 137 133*  K 4.2 4.2 3.4*  CL 107 106 100  CO2 23 26 25   GLUCOSE 98 102* 121*  BUN 10 9 12   CREATININE 1.06 0.88 0.79  CALCIUM 8.3* 8.4* 8.1*   GFRNONAA >60 >60 >60  PROT 7.3 6.6 6.9  ALBUMIN 3.6 3.4* 3.5  AST 64* 65* 65*  ALT 88* 75* 80*  ALKPHOS 218* 179* 157*  BILITOT 0.4 0.5 0.4     RADIOGRAPHIC STUDIES: I have personally reviewed the radiological images as listed and agreed with the findings in the report. No results found.  ASSESSMENT & PLAN:   No problem-specific Assessment & Plan notes found for this encounter.  1.Stage IV adenocarcinoma of the lung- left lower lobe lung cancer.  EGFR-19 deletion positive.  On osimertinib 80 mg daily since 04/11/2021.  Progressed based on 08/15/2022 PET.  Repeat NGS of liver biopsy was positive for EGFR mutation but otherwise negative for any novel mutations.  Status post second opinion at [facility]-Dr. 04/13/2021.  Currently receiving carboplatin-Taxol-Avastin plus atezolizumab.  Currently status post cycle 4. Plan is for PET on 11/08/22 to assess response. He symptomatically feels better which is encouraging.   2.  Neutropenia-due to chemotherapy.- s/p udenyca. ANC 8.2. Some mild arthralgias and myalgias. Monitor.   3. Asmptomatic brain mets- s/p SBRT Sep 2023. plan to repeat brain mri in February. Clinically, asymptomatic.   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   5. Chemotherapy follow up- declines IV fluids today   DISPOSITION:  Deaccess port Follow up as scheduled -la  All questions were answered. The patient knows to call the clinic with any problems, questions or concerns.   Oct 2023, NP 11/06/2022

## 2022-11-08 ENCOUNTER — Ambulatory Visit
Admission: RE | Admit: 2022-11-08 | Discharge: 2022-11-08 | Disposition: A | Discharge status: Home / Self Care | Payer: 59 | Source: Ambulatory Visit | Attending: Internal Medicine | Admitting: Internal Medicine

## 2022-11-08 DIAGNOSIS — J90 Pleural effusion, not elsewhere classified: Secondary | ICD-10-CM | POA: Diagnosis not present

## 2022-11-08 DIAGNOSIS — J941 Fibrothorax: Secondary | ICD-10-CM | POA: Diagnosis not present

## 2022-11-08 DIAGNOSIS — C3432 Malignant neoplasm of lower lobe, left bronchus or lung: Secondary | ICD-10-CM | POA: Insufficient documentation

## 2022-11-08 DIAGNOSIS — J9811 Atelectasis: Secondary | ICD-10-CM | POA: Insufficient documentation

## 2022-11-08 DIAGNOSIS — C3490 Malignant neoplasm of unspecified part of unspecified bronchus or lung: Secondary | ICD-10-CM | POA: Diagnosis present

## 2022-11-08 DIAGNOSIS — R911 Solitary pulmonary nodule: Secondary | ICD-10-CM | POA: Insufficient documentation

## 2022-11-08 LAB — GLUCOSE, CAPILLARY: Glucose-Capillary: 103 mg/dL — ABNORMAL HIGH (ref 70–99)

## 2022-11-08 MED ORDER — FLUDEOXYGLUCOSE F - 18 (FDG) INJECTION
12.1900 | Freq: Once | INTRAVENOUS | Status: AC | PRN
  Administered 2022-11-08: 12.19 via INTRAVENOUS

## 2022-11-08 NOTE — Progress Notes (Incomplete)
[name] presents today for follow up after completing radiation treatment for brain metastasis.He completed treatment on 06-05-22. We will be planning for SRS Ct simulation today based on most recent MRI results.    Nathan Lim, MD - 09/20/2022 2:00 PM EST  ONCOLOGY HISTORY  I am seeing the patient at the request of Dr. Rogue Bussing, an oncologist, for further evaluation and consideration of treatment options for EGFR mutant NSCLC. The patient lives in [place]. The patient was treated for respiratory symptoms and was subsequently found to have EGFR mutant NSCLC. He had a symptomatic response and radiographic response to osimertinib including his brain metastases. He did well be subsequently had disease progression with new liver lesion. He underwent CT DNA testing (please see below). He was started on the carboplatin, paclitaxel, bevacizumab and atezolizumab. He reports problems with blood counts and paresthesias related to the treatment. The neulasta is causing bone and muscle bone pain. I discussed the combination of carboplatin, paclitaxel, bevacizumab and atezolizumab. I reviewed the side effects of carboplatin and paclitaxel including nausea, vomiting, numbness to fingertips and feet, myalgias, arthralgias, anemia, neutropenia, and hair loss. I also discussed the side effects such as bevacizumab including hypertension (approximately 1/10), proteinuria (approximately 1/50), and bleeding (approximately 1/50). I discussed Huey Bienenstock is an immunotherapy and that this truns the body's immune system on to recognize cancer as foreign.. This can cause immune related inflammation of the lungs, colon, liver and skin. I discussed that we will get a CT scan after the 2nd and 4th cycles. I discussed the maximum number of cycles of this combination would be four cycles, and then continue bevacizumab/atezoilzumab as maintenance therapy. I discussed biopsy to consider if the patient has MET  amplification and may be a candidate for osimertinib and tepotinib. I also explained we would check for transformation to [place], but this is less likely with the absence to TP53 and RB loss on ctDNA. He reports the biopsy is already scheduled  INTERVAL HISTORY  Assessment/Plan: 42 year-old man with EGFR mutant NSCLC Onc- would get tumor imaging after 2 cycles to assess effectiveness of therapy. I would consider dose reduction of carboplatin and paclitaxel given the side effects and going without neulasta supportive therapy given the side effects. Agree with the plan for tumor biopsy for molecular testing   Recent neurologic symptoms, if any:  Seizures: {:18581} Headaches: {:18581} Nausea: {:18581} Dizziness/ataxia: {:18581} Difficulty with hand coordination: {:18581} Focal numbness/weakness: {:18581} Visual deficits/changes: {:18581} Confusion/Memory deficits: {:18581}  Other issues of note:  Brain MRI results:   IMPRESSION: 1. Numerous new punctate foci of enhancement in the bilateral cerebral and cerebellar hemispheres, as detailed above and concerning for progressive metastatic disease. 2. Few foci of minimal residual enhancement in the left precentral gyrus and inferior right parietal lobe, with decreased surrounding vasogenic edema. 3. Scattered foci of susceptibility throughout both cerebral hemispheres without significant enhancement or vasogenic edema, compatible with treated metastatic disease.     Electronically Signed   By: Emmit Alexanders M.D.   On: 11/16/2022 09:22  PET Scan IMPRESSION: 1. Interval response to therapy with resolution of previously demonstrated extensive hypermetabolic activity throughout the liver. No residual abnormal hypermetabolic activity within the chest, abdomen or pelvis. 2. Stable chronic loculated left pleural effusion/fibrothorax with compressive atelectasis of the remaining left lung. 3. Mild diffuse osseous activity attributed to  treatment response. 4. Stable small right upper lobe pulmonary nodule, likely benign based on stability.     Electronically Signed   By: Gwyndolyn Saxon  Lin Landsman M.D.   On: 11/09/2022 08:53

## 2022-11-13 ENCOUNTER — Inpatient Hospital Stay: Payer: 59

## 2022-11-13 ENCOUNTER — Encounter: Payer: Self-pay | Admitting: Internal Medicine

## 2022-11-13 ENCOUNTER — Other Ambulatory Visit: Payer: Self-pay | Admitting: *Deleted

## 2022-11-13 ENCOUNTER — Inpatient Hospital Stay (HOSPITAL_BASED_OUTPATIENT_CLINIC_OR_DEPARTMENT_OTHER): Payer: 59 | Admitting: Internal Medicine

## 2022-11-13 VITALS — BP 137/93 | HR 84 | Temp 96.8°F | Resp 18 | Wt 221.0 lb

## 2022-11-13 DIAGNOSIS — C3432 Malignant neoplasm of lower lobe, left bronchus or lung: Secondary | ICD-10-CM

## 2022-11-13 DIAGNOSIS — Z5112 Encounter for antineoplastic immunotherapy: Secondary | ICD-10-CM | POA: Diagnosis not present

## 2022-11-13 LAB — CBC WITH DIFFERENTIAL/PLATELET
Abs Immature Granulocytes: 0.04 10*3/uL (ref 0.00–0.07)
Basophils Absolute: 0.1 10*3/uL (ref 0.0–0.1)
Basophils Relative: 1 %
Eosinophils Absolute: 0 10*3/uL (ref 0.0–0.5)
Eosinophils Relative: 0 %
HCT: 37.7 % — ABNORMAL LOW (ref 39.0–52.0)
Hemoglobin: 12.4 g/dL — ABNORMAL LOW (ref 13.0–17.0)
Immature Granulocytes: 1 %
Lymphocytes Relative: 14 %
Lymphs Abs: 0.9 10*3/uL (ref 0.7–4.0)
MCH: 31.2 pg (ref 26.0–34.0)
MCHC: 32.9 g/dL (ref 30.0–36.0)
MCV: 95 fL (ref 80.0–100.0)
Monocytes Absolute: 0.8 10*3/uL (ref 0.1–1.0)
Monocytes Relative: 13 %
Neutro Abs: 4.4 10*3/uL (ref 1.7–7.7)
Neutrophils Relative %: 71 %
Platelets: 195 10*3/uL (ref 150–400)
RBC: 3.97 MIL/uL — ABNORMAL LOW (ref 4.22–5.81)
RDW: 21.1 % — ABNORMAL HIGH (ref 11.5–15.5)
WBC: 6.3 10*3/uL (ref 4.0–10.5)
nRBC: 0 % (ref 0.0–0.2)

## 2022-11-13 LAB — COMPREHENSIVE METABOLIC PANEL
ALT: 65 U/L — ABNORMAL HIGH (ref 0–44)
AST: 52 U/L — ABNORMAL HIGH (ref 15–41)
Albumin: 3.6 g/dL (ref 3.5–5.0)
Alkaline Phosphatase: 172 U/L — ABNORMAL HIGH (ref 38–126)
Anion gap: 8 (ref 5–15)
BUN: 14 mg/dL (ref 6–20)
CO2: 23 mmol/L (ref 22–32)
Calcium: 8.3 mg/dL — ABNORMAL LOW (ref 8.9–10.3)
Chloride: 104 mmol/L (ref 98–111)
Creatinine, Ser: 0.77 mg/dL (ref 0.61–1.24)
GFR, Estimated: >60 mL/min (ref >60)
Glucose, Bld: 114 mg/dL — ABNORMAL HIGH (ref 70–99)
Potassium: 3.7 mmol/L (ref 3.5–5.1)
Sodium: 135 mmol/L (ref 135–145)
Total Bilirubin: 0.2 mg/dL — ABNORMAL LOW (ref 0.3–1.2)
Total Protein: 6.9 g/dL (ref 6.5–8.1)

## 2022-11-13 LAB — URINALYSIS, DIPSTICK ONLY
Bilirubin Urine: NEGATIVE
Glucose, UA: NEGATIVE mg/dL
Hgb urine dipstick: NEGATIVE
Ketones, ur: NEGATIVE mg/dL
Leukocytes,Ua: NEGATIVE
Nitrite: NEGATIVE
Protein, ur: NEGATIVE mg/dL
Specific Gravity, Urine: 1.023 (ref 1.005–1.030)
pH: 5 (ref 5.0–8.0)

## 2022-11-13 MED ORDER — SODIUM CHLORIDE 0.9 % IV SOLN
1200.0000 mg | Freq: Once | INTRAVENOUS | Status: AC
  Administered 2022-11-13: 1200 mg via INTRAVENOUS
  Filled 2022-11-13: qty 20

## 2022-11-13 MED ORDER — SODIUM CHLORIDE 0.9 % IV SOLN
1400.0000 mg | Freq: Once | INTRAVENOUS | Status: AC
  Administered 2022-11-13: 1400 mg via INTRAVENOUS
  Filled 2022-11-13: qty 32

## 2022-11-13 MED ORDER — SODIUM CHLORIDE 0.9 % IV SOLN
Freq: Once | INTRAVENOUS | Status: AC
  Filled 2022-11-13: qty 250

## 2022-11-13 MED ORDER — OMEPRAZOLE 20 MG PO CPDR: 20.0000 mg | DELAYED_RELEASE_CAPSULE | Freq: Every day | ORAL | 2 refills | Status: DC

## 2022-11-13 MED ORDER — HEPARIN SOD (PORK) LOCK FLUSH 100 UNIT/ML IV SOLN
500.0000 [IU] | Freq: Once | INTRAVENOUS | Status: AC | PRN
  Administered 2022-11-13: 500 [IU]
  Filled 2022-11-13: qty 5

## 2022-11-13 MED ORDER — SODIUM CHLORIDE 0.9 % IV SOLN
15.0000 mg/kg | Freq: Once | INTRAVENOUS | Status: DC
  Filled 2022-11-13: qty 64

## 2022-11-13 NOTE — Progress Notes (Signed)
Patient denies new problems/concerns today.   °

## 2022-11-13 NOTE — Assessment & Plan Note (Addendum)
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   NOV 1st, 2023- PET scan- shows progressive disease in liver.  New tracer avid upper abdominal lymph nodes compatible with nodal metastasis; New tracer avid nodule within the central left midlung compatible with concerning for locally recurrent tumor or metastatic disease.; Similar appearance of large loculated left pleural effusion with mild overlying tracer uptake; New small volume of ascites within the right iliac fossa and central pelvis with mild adjacent thickening of the peritoneal reflections. Progressive interlobular septal thickening within the right middle lobe and right lower lobe. Findings are nonspecific and may reflect asymmetric pulmonary edema. Lymphangitic spread of tumor is not excluded.  Recommend liver biopsy to rule out transformation. Unable to perform ultrasound-guided biopsy liver- see below. Repeat NGS testing.    Liquid Biopsy/Guardant testing-positive for EGFR mutation; otherwise Negative for any novel mutations.  Patient currently chemo-immunotherapy-carbo+ Taxol plus Avastin plus Atezo. D-2 neulasta.  S/p repeat liver Biopsy positive for adenocarcinoma. NEGATIVE for MET amplification.  S/p second opinion at [facility].   # Status post 4 cycles of carbo Taxol-Atezo+Bev- JAN 25th- Interval response to therapy with resolution of previously demonstrated extensive hypermetabolic activity throughout the liver. No residual abnormal hypermetabolic activity within the chest, abdomen or pelvis. Stable chronic loculated left pleural effusion/fibrothorax with compressive atelectasis of the remaining left lung.  Mild diffuse osseous activity attributed to treatment response.  # Proceed with maintenance- Atezo + bev every 3 weeks.  Cycle #1 today. Labs today reviewed;  acceptable for treatment today.   #Asymptomatic multiple brain mets subcentimeter asymptomatic -JUNE 29th, 2023- Numerous  metastatic lesions in the supratentorial brain-increase in number also conspicuity compared to January 2023.  Pre-SBRT MRI brain AUG 18th, 2023-  Eighteen small enhancing brain metastases (punctate to 11 mm); were all present on 03/28/2021-s/p SBRT SEP 2023- [[place]].  August 15, 2022 brain MRI-overall stable.  Discussed with radiation oncology at [facility].    #Elevated LFTs: AST ALT 100 range; elevated alkaline phosphatase normal bilirubin-likely secondary progressive disease in the liver. NOV/PET scan 2023- MRI liver- . Diffuse hepatic metastatic disease with numerous lesions throughout both lobes of the liver-- monitor for now.  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 On Eliquis-5 mg BID. Stable.  #Bone metastases- CT DEc 7th, 2022 to healing process.  Dental evaluation on 9/21.  Continue calcium vitamin D intake. calcium 8.5- HOLD for now. Stable.  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  Will refill-Stable.  # IV access/ port flush.   # DISPOSITION:  # Atezo+Bev today.   # follow up in 1 week- APP- labs- cbc/cmp; possible IVFs over 1 hour  # Follow up in 3 weeks- MD; labs- cbc/cmp; CEA; UA;Atezo+Bev; TSH-. Dr.B  # I reviewed the blood work- with the patient in detail; also reviewed the imaging independently [as summarized above]; and with the patient in detail.

## 2022-11-13 NOTE — Telephone Encounter (Signed)
Pharmacy request for omeprazole

## 2022-11-13 NOTE — Patient Instructions (Signed)
[facility]  Discharge Instructions: Thank you for choosing [facility] to provide your oncology and hematology care.  If you have a lab appointment with the [facility], please go directly to the [facility] and check in at the registration area.  Wear comfortable clothing and clothing appropriate for easy access to any Portacath or PICC line.   We strive to give you quality time with your provider. You may need to reschedule your appointment if you arrive late (15 or more minutes).  Arriving late affects you and other patients whose appointments are after yours.  Also, if you miss three or more appointments without notifying the office, you may be dismissed from the clinic at the provider's discretion.      For prescription refill requests, have your pharmacy contact our office and allow 72 hours for refills to be completed.    Today you received the following chemotherapy and/or immunotherapy agents TECENTRIQ and MVASI      To help prevent nausea and vomiting after your treatment, we encourage you to take your nausea medication as directed.  BELOW ARE SYMPTOMS THAT SHOULD BE REPORTED IMMEDIATELY: *FEVER GREATER THAN 100.4 F (38 C) OR HIGHER *CHILLS OR SWEATING *NAUSEA AND VOMITING THAT IS NOT CONTROLLED WITH YOUR NAUSEA MEDICATION *UNUSUAL SHORTNESS OF BREATH *UNUSUAL BRUISING OR BLEEDING *URINARY PROBLEMS (pain or burning when urinating, or frequent urination) *BOWEL PROBLEMS (unusual diarrhea, constipation, pain near the anus) TENDERNESS IN MOUTH AND THROAT WITH OR WITHOUT PRESENCE OF ULCERS (sore throat, sores in mouth, or a toothache) UNUSUAL RASH, SWELLING OR PAIN  UNUSUAL VAGINAL DISCHARGE OR ITCHING   Items with * indicate a potential emergency and should be followed up as soon as possible or go to the Emergency Department if any problems should occur.  Please show the CHEMOTHERAPY ALERT CARD or IMMUNOTHERAPY ALERT CARD at  check-in to the Emergency Department and triage nurse.  Should you have questions after your visit or need to cancel or reschedule your appointment, please contact [facility]  [phone] and follow the prompts.  Office hours are 8:00 a.m. to 4:30 p.m. Monday - Friday. Please note that voicemails left after 4:00 p.m. may not be returned until the following business day.  We are closed weekends and major holidays. You have access to a nurse at all times for urgent questions. Please call the main number to the clinic [phone] and follow the prompts.  For any non-urgent questions, you may also contact your provider using MyChart. We now offer e-Visits for anyone 43 and older to request care online for non-urgent symptoms. For details visit [email or web address].   Also download the MyChart app! Go to the app store, search "MyChart", open the app, select [facility], and log in with your MyChart username and password.  Atezolizumab Injection What is this medication? ATEZOLIZUMAB (a te zoe LIZ ue mab) treats some types of cancer. It works by helping your immune system slow or stop the spread of cancer cells. It is a monoclonal antibody. This medicine may be used for other purposes; ask your health care provider or pharmacist if you have questions. COMMON BRAND NAME(S): Tecentriq What should I tell my care team before I take this medication? They need to know if you have any of these conditions: Allogeneic stem cell transplant (uses someone else's stem cells) Autoimmune diseases, such as Crohn disease, ulcerative colitis, lupus History of chest radiation Nervous system problems, such as Guillain-Barre  syndrome, myasthenia gravis Organ transplant An unusual or allergic reaction to atezolizumab, other medications, foods, dyes, or preservatives Pregnant or trying to get pregnant Breast-feeding How should I use this medication? This medication is injected into  a vein. It is given by your care team in a hospital or clinic setting. A special MedGuide will be given to you before each treatment. Be sure to read this information carefully each time. Talk to your care team about the use of this medication in children. While it may be prescribed for children as young as 2 years for selected conditions, precautions do apply. Overdosage: If you think you have taken too much of this medicine contact a poison control center or emergency room at once. NOTE: This medicine is only for you. Do not share this medicine with others. What if I miss a dose? Keep appointments for follow-up doses. It is important not to miss your dose. Call your care team if you are unable to keep an appointment. What may interact with this medication? Interactions have not been studied. This list may not describe all possible interactions. Give your health care provider a list of all the medicines, herbs, non-prescription drugs, or dietary supplements you use. Also tell them if you smoke, drink alcohol, or use illegal drugs. Some items may interact with your medicine. What should I watch for while using this medication? Your condition will be monitored carefully while you are receiving this medication. You may need blood work while taking this medication. This medication may cause serious skin reactions. They can happen weeks to months after starting the medication. Contact your care team right away if you notice fevers or flu-like symptoms with a rash. The rash may be red or purple and then turn into blisters or peeling of the skin. You may also notice a red rash with swelling of the face, lips, or lymph nodes in your neck or under your arms. Tell your care team right away if you have any change in your eyesight. Talk to your care team if you may be pregnant. Serious birth defects can occur if you take this medication during pregnancy and for 5 months after the last dose. You will need a  negative pregnancy test before starting this medication. Contraception is recommended while taking this medication and for 5 months after the last dose. Your care team can help you find the option that works for you. Do not breastfeed while taking this medication and for at least 5 months after the last dose. What side effects may I notice from receiving this medication? Side effects that you should report to your doctor or health care professional as soon as possible: Allergic reactions--skin rash, itching, hives, swelling of the face, lips, tongue, or throat Dry cough, shortness of breath or trouble breathing Eye pain, redness, irritation, or discharge with blurry or decreased vision Heart muscle inflammation--unusual weakness or fatigue, shortness of breath, chest pain, fast or irregular heartbeat, dizziness, swelling of the ankles, feet, or hands Hormone gland problems--headache, sensitivity to light, unusual weakness or fatigue, dizziness, fast or irregular heartbeat, increased sensitivity to cold or heat, excessive sweating, constipation, hair loss, increased thirst or amount of urine, tremors or shaking, irritability Infusion reactions--chest pain, shortness of breath or trouble breathing, feeling faint or lightheaded Kidney injury (glomerulonephritis)--decrease in the amount of urine, red or dark brown urine, foamy or bubbly urine, swelling of the ankles, hands, or feet Liver injury--right upper belly pain, loss of appetite, nausea, light-colored stool, dark yellow or  brown urine, yellowing skin or eyes, unusual weakness or fatigue Pain, tingling, or numbness in the hands or feet, muscle weakness, change in vision, confusion or trouble speaking, loss of balance or coordination, trouble walking, seizures Rash, fever, and swollen lymph nodes Redness, blistering, peeling, or loosening of the skin, including inside the mouth Sudden or severe stomach pain, bloody diarrhea, fever, nausea,  vomiting Side effects that usually do not require medical attention (report to your doctor or health care professional if they continue or are bothersome): Bone, joint, or muscle pain Diarrhea Fatigue Loss of appetite Nausea Skin rash This list may not describe all possible side effects. Call your doctor for medical advice about side effects. You may report side effects to FDA at 1-800-FDA-1088. Where should I keep my medication? This medication is given in a hospital or clinic. It will not be stored at home. NOTE: This sheet is a summary. It may not cover all possible information. If you have questions about this medicine, talk to your doctor, pharmacist, or health care provider.  2023 Elsevier/Gold Standard (2022-02-13 00:00:00)   Bevacizumab Injection What is this medication? BEVACIZUMAB (be va SIZ yoo mab) treats some types of cancer. It works by blocking a protein that causes cancer cells to grow and multiply. This helps to slow or stop the spread of cancer cells. It is a monoclonal antibody. This medicine may be used for other purposes; ask your health care provider or pharmacist if you have questions. COMMON BRAND NAME(S): Alymsys, Avastin, MVASI, Noah Charon What should I tell my care team before I take this medication? They need to know if you have any of these conditions: Blood clots Coughing up blood Having or recent surgery Heart failure High blood pressure History of a connection between 2 or more body parts that do not usually connect (fistula) History of a tear in your stomach or intestines Protein in your urine An unusual or allergic reaction to bevacizumab, other medications, foods, dyes, or preservatives Pregnant or trying to get pregnant Breast-feeding How should I use this medication? This medication is injected into a vein. It is given by your care team in a hospital or clinic setting. Talk to your care team the use of this medication in children. Special care may  be needed. Overdosage: If you think you have taken too much of this medicine contact a poison control center or emergency room at once. NOTE: This medicine is only for you. Do not share this medicine with others. What if I miss a dose? Keep appointments for follow-up doses. It is important not to miss your dose. Call your care team if you are unable to keep an appointment. What may interact with this medication? Interactions are not expected. This list may not describe all possible interactions. Give your health care provider a list of all the medicines, herbs, non-prescription drugs, or dietary supplements you use. Also tell them if you smoke, drink alcohol, or use illegal drugs. Some items may interact with your medicine. What should I watch for while using this medication? Your condition will be monitored carefully while you are receiving this medication. You may need blood work while taking this medication. This medication may make you feel generally unwell. This is not uncommon as chemotherapy can affect healthy cells as well as cancer cells. Report any side effects. Continue your course of treatment even though you feel ill unless your care team tells you to stop. This medication may increase your risk to bruise or bleed. Call your  care team if you notice any unusual bleeding. Before having surgery, talk to your care team to make sure it is ok. This medication can increase the risk of poor healing of your surgical site or wound. You will need to stop this medication for 28 days before surgery. After surgery, wait at least 28 days before restarting this medication. Make sure the surgical site or wound is healed enough before restarting this medication. Talk to your care team if questions. Talk to your care team if you may be pregnant. Serious birth defects can occur if you take this medication during pregnancy and for 6 months after the last dose. Contraception is recommended while taking this  medication and for 6 months after the last dose. Your care team can help you find the option that works for you. Do not breastfeed while taking this medication and for 6 months after the last dose. This medication can cause infertility. Talk to your care team if you are concerned about your fertility. What side effects may I notice from receiving this medication? Side effects that you should report to your care team as soon as possible: Allergic reactions--skin rash, itching, hives, swelling of the face, lips, tongue, or throat Bleeding--bloody or black, tar-like stools, vomiting blood or brown material that looks like coffee grounds, red or dark brown urine, small red or purple spots on skin, unusual bruising or bleeding Blood clot--pain, swelling, or warmth in the leg, shortness of breath, chest pain Heart attack--pain or tightness in the chest, shoulders, arms, or jaw, nausea, shortness of breath, cold or clammy skin, feeling faint or lightheaded Heart failure--shortness of breath, swelling of the ankles, feet, or hands, sudden weight gain, unusual weakness or fatigue Increase in blood pressure Infection--fever, chills, cough, sore throat, wounds that don't heal, pain or trouble when passing urine, general feeling of discomfort or being unwell Infusion reactions--chest pain, shortness of breath or trouble breathing, feeling faint or lightheaded Kidney injury--decrease in the amount of urine, swelling of the ankles, hands, or feet Stomach pain that is severe, does not go away, or gets worse Stroke--sudden numbness or weakness of the face, arm, or leg, trouble speaking, confusion, trouble walking, loss of balance or coordination, dizziness, severe headache, change in vision Sudden and severe headache, confusion, change in vision, seizures, which may be signs of posterior reversible encephalopathy syndrome (PRES) Side effects that usually do not require medical attention (report to your care team if  they continue or are bothersome): Back pain Change in taste Diarrhea Dry skin Increased tears Nosebleed This list may not describe all possible side effects. Call your doctor for medical advice about side effects. You may report side effects to FDA at 1-800-FDA-1088. Where should I keep my medication? This medication is given in a hospital or clinic. It will not be stored at home. NOTE: This sheet is a summary. It may not cover all possible information. If you have questions about this medicine, talk to your doctor, pharmacist, or health care provider.  2023 Elsevier/Gold Standard (2022-02-02 00:00:00)

## 2022-11-13 NOTE — Progress Notes (Signed)
[name] NOTE  Patient Care Team: Pcp, No as PCP - General [name], RN as Oncology Nurse Navigator [name], MD as Consulting Physician (Internal Medicine)  CHIEF COMPLAINTS/PURPOSE OF CONSULTATION: Lung cancer  #  Oncology History Overview Note  IMPRESSION: 1. Patchy nodular fat stranding throughout the anterior left upper quadrant peritoneal fat, nonspecific, cannot exclude peritoneal carcinomatosis. Dedicated CT abdomen/pelvis with oral and IV contrast recommended for further evaluation. 2. Dense patchy consolidation replacing much of the left lower lung lobe, appearing masslike in the superior segment left lower lobe, with associated bulging of the left major fissure and associated left lower lobe volume loss. Fine nodularity throughout both lungs with an upper lobe predominance. Asymmetric left upper lobe interlobular septal thickening. These findings are indeterminate, with differential including multilobar pneumonia, sarcoidosis or a neoplastic process. The persistence on radiographs back to 01/20/2021 despite antibiotic therapy make sarcoidosis or a neoplastic process more likely. Pulmonology consultation suggested for consideration of bronchoscopic evaluation. 3. Small dependent left pleural effusion. 4. Mild mediastinal lymphadenopathy, nonspecific. 5. Subacute healing lateral right sixth rib fracture.   DIAGNOSIS:  A. LUNG, LEFT LOWER LOBE; ENB-ASSISTED BIOPSY:  - NON-SMALL CELL CARCINOMA, FAVOR ADENOCARCINOMA.  - FOREIGN MATERIAL SUGGESTIVE OF ASPIRATION.   # EGFR MUTATED: 95 del- June 28th, 2022- [name]. [limited]  # Asymptomatic multiple brain mets subcentimeter asymptomatic -JUNE 29th, 2023- Numerous metastatic lesions in the supratentorial brain-increase in number also conspicuity compared to January 2023.  Pre-SBRT MRI brain AUG 18th, 2023-  Eighteen small enhancing brain metastases (punctate to 11 mm); were all  present on 03/28/2021-s/p SBRT SEP 2023- [[place]]  # SEP-OCT-worsening left-sided pleural effusion-status post paracentesis; exudative; Cytology negative- ?  Progressive disease.  # DEC 2023- CARBO-TAXOL-BEV+ATEZO   Cancer of lower lobe of left lung ([place])  03/23/2021 Initial Diagnosis   Cancer of lower lobe of left lung ([place])   03/29/2021 Cancer Staging   Staging form: Lung, AJCC 8th Edition - Clinical: Stage IVB (cT3, cN3, pM1c) - Signed by [name], MD on 03/29/2021   03/30/2021 - 03/30/2021 Chemotherapy   Patient is on Treatment Plan : LUNG NSCLC Pemetrexed ([place]) / Carboplatin q21d x 1 cycles     08/17/2022 -  Chemotherapy   Patient is on Treatment Plan : LUNG Atezolizumab + Bevacizumab + Carboplatin + Paclitaxel q21d Induction x 4 cycles / Atezolizumab + Bevacizumab q21d Maintenance      HISTORY OF PRESENTING ILLNESS: Ambulating independently.  With wife.   [name] 42 y.o.  male patient with stage IV lung cancer adenocarcinoma-brain mets [synchronous] bone mets EGFR mutated progressed  on osimertinib-currently on chemotherapy- carbo-taxol-Atezo-Bev is here for follow-up/review results of the recent PET scan.  Patient was seen in the symptom management clinic needing IV fluids weekly for fatigue and dehydration.  Otherwise patient did not need any admission to hospital for neutropenia or fevers.  Patient had joint pains after his growth factor injection.  Currently resolved.   Appetite is better.  However has lost some weight.  Abdominal pain improved.  Denies any worsening headaches. Denies any worsening pain.  No worsening skin rash.  Denies any worsening tingling or numbness.  Review of Systems  Constitutional:  Positive for malaise/fatigue. Negative for chills, diaphoresis and fever.  HENT:  Negative for nosebleeds and sore throat.   Eyes:  Negative for double vision.  Respiratory:  Positive for cough. Negative for wheezing.   Cardiovascular:  Negative for  chest pain, palpitations, orthopnea and leg  swelling.  Gastrointestinal:  Positive for heartburn and nausea. Negative for abdominal pain, blood in stool, diarrhea and melena.  Genitourinary:  Negative for dysuria, frequency and urgency.  Musculoskeletal:  Negative for back pain and joint pain.  Skin:  Negative for itching.  Neurological:  Positive for dizziness, weakness and headaches. Negative for tingling and focal weakness.  Endo/Heme/Allergies:  Does not bruise/bleed easily.  Psychiatric/Behavioral:  Negative for depression. The patient is not nervous/anxious and does not have insomnia.      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    SURGICAL HISTORY: Past Surgical History:  Procedure Laterality Date   FRACTURE SURGERY     broke femur when he was 6 years   PORTA CATH INSERTION N/A 03/28/2021   Procedure: PORTA CATH INSERTION;  Surgeon: Katha Cabal, MD;  Location: [facility];  Service: Cardiovascular;  Laterality: N/A;   VIDEO BRONCHOSCOPY WITH ENDOBRONCHIAL NAVIGATION N/A 03/15/2021   Procedure: VIDEO BRONCHOSCOPY WITH ENDOBRONCHIAL NAVIGATION;  Surgeon: Ottie Glazier, MD;  Location: [facility];  Service: Thoracic;  Laterality: N/A;   VIDEO BRONCHOSCOPY WITH ENDOBRONCHIAL ULTRASOUND N/A 03/15/2021   Procedure: VIDEO BRONCHOSCOPY WITH ENDOBRONCHIAL ULTRASOUND;  Surgeon: Ottie Glazier, MD;  Location: [facility];  Service: Thoracic;  Laterality: N/A;    SOCIAL HISTORY: Social History   Socioeconomic History   Marital status: Married    Spouse name: [name]   Number of children: 2   Years of education: Not on file   Highest education level: Not on file  Occupational History   Not on file  Tobacco Use   Smoking status: Never   Smokeless tobacco: Former    Types: Snuff, Chew  Vaping Use   Vaping Use: Never used  Substance and Sexual  Activity   Alcohol use: Never   Drug use: Never   Sexual activity: Yes  Other Topics Concern   Not on file  Social History Narrative   [place] in [place]; with wife; 2 daughters[12 and 22]; never smoked; rare alcohol. 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   Social Determinants of Health   Financial Resource Strain: Low Risk  (05/10/2022)   Overall Financial Resource Strain (CARDIA)    Difficulty of Paying Living Expenses: Not hard at all  Food Insecurity: No Food Insecurity (05/10/2022)   Hunger Vital Sign    Worried About Running Out of Food in the Last Year: Never true    Ran Out of Food in the Last Year: Never true  Transportation Needs: No Transportation Needs (03/16/2022)   PRAPARE - Hydrologist (Medical): No    Lack of Transportation (Non-Medical): No  Physical Activity: Not on file  Stress: Not on file  Social Connections: Socially Integrated (05/10/2022)   Social Connection and Isolation Panel [NHANES]    Frequency of Communication with Friends and Family: More than three times a week    Frequency of Social Gatherings with Friends and Family: More than three times a week    Attends Religious Services: More than 4 times per year    Active Member of [employer] or Organizations: Yes    Attends Music therapist: More than 4 times per year    Marital Status: Married  Human resources officer Violence: Not on file    FAMILY HISTORY: Family History  Problem  Relation Age of Onset   High Cholesterol Mother    Hypertension Father    Cancer Father    Lung cancer Father    Skin cancer Father     ALLERGIES:  is allergic to codeine.  MEDICATIONS:  Current Outpatient Medications  Medication Sig Dispense Refill   apixaban (ELIQUIS) 5 MG TABS tablet Take 1 tablet (5 mg total) by mouth 2 (two) times daily. 60 tablet 2   folic acid (FOLVITE) 1 MG tablet TAKE 1 TABLET BY MOUTH EVERY DAY 90 tablet 1   lidocaine-prilocaine (EMLA) cream Apply 1 application  topically as needed. 30 g 0   lidocaine-prilocaine (EMLA) cream Apply 1 Application topically as needed. 30 g 0   loratadine (CLARITIN) 10 MG tablet Take 10 mg by mouth daily. For 5 days after the treatment.     metoprolol succinate (TOPROL-XL) 50 MG 24 hr tablet Take 50 mg by mouth daily. Take with or immediately following a meal.     pegfilgrastim (NEULASTA) 6 MG/0.6ML injection Inject 6 mg into the skin once.     sertraline (ZOLOFT) 50 MG tablet TAKE 1/2 TABLET BY MOUTH DAILY 30 tablet 2   calcium carbonate (TUMS EX) 750 MG chewable tablet Chew 1 tablet by mouth daily. (Patient not taking: Reported on 08/27/2022)     dronabinol (MARINOL) 2.5 MG capsule Take 1 capsule (2.5 mg total) by mouth 2 (two) times daily before lunch and supper. (Patient not taking: Reported on 11/13/2022) 60 capsule 0   gabapentin (NEURONTIN) 100 MG capsule Take 1 capsule (100 mg total) by mouth at bedtime. (Patient not taking: Reported on 11/13/2022) 30 capsule 1   omeprazole (PRILOSEC) 20 MG capsule Take 1 capsule (20 mg total) by mouth daily. (Patient not taking: Reported on 11/13/2022) 30 capsule 2   ondansetron (ZOFRAN) 8 MG tablet One pill every 8 hours as needed for nausea/vomitting. (Patient not taking: Reported on 10/09/2022) 40 tablet 1   oxyCODONE (OXY IR/ROXICODONE) 5 MG immediate release tablet Take 1 tablet (5 mg total) by mouth every 4 (four) hours as needed for severe pain. (Patient not taking: Reported on 10/09/2022) 30 tablet 0   potassium chloride SA (KLOR-CON M) 20 MEQ tablet Take 1 tablet (20 mEq total) by mouth daily. (Patient not taking: Reported on 11/13/2022) 14 tablet 0   prochlorperazine (COMPAZINE) 10 MG tablet Take 1 tablet (10 mg total) by mouth every 6 (six) hours as needed for nausea or vomiting. (Patient not taking: Reported on 10/09/2022) 40 tablet 1   traMADol (ULTRAM) 50 MG tablet Take 50 mg by mouth every 6 (six) hours as needed for moderate pain or severe pain. (Patient not taking: Reported  on 10/09/2022)     No current facility-administered medications for this visit.   Facility-Administered Medications Ordered in Other Visits  Medication Dose Route Frequency Provider Last Rate Last Admin   heparin lock flush 100 UNIT/ML injection            heparin lock flush 100 UNIT/ML injection            sodium chloride flush (NS) 0.9 % injection 10 mL  10 mL Intravenous PRN [name], MD   10 mL at 07/28/21 0852      .  PHYSICAL EXAMINATION: ECOG PERFORMANCE STATUS: 1 - Symptomatic but completely ambulatory  Vitals:   11/13/22 1100  BP: (!) 137/93  Pulse: 84  Resp: 18  Temp: (!) 96.8 F (36 C)  SpO2: 97%  Filed Weights   11/13/22 1100  Weight: 221 lb (100.2 kg)         Physical Exam HENT:     Head: Normocephalic and atraumatic.     Mouth/Throat:     Pharynx: No oropharyngeal exudate.  Eyes:     Pupils: Pupils are equal, round, and reactive to light.  Cardiovascular:     Rate and Rhythm: Normal rate and regular rhythm.  Pulmonary:     Effort: No respiratory distress.     Breath sounds: No wheezing.     Comments: Decreased breath sound on the left side compared to right. Abdominal:     General: Bowel sounds are normal. There is no distension.     Palpations: Abdomen is soft. There is no mass.     Tenderness: There is no abdominal tenderness. There is no guarding or rebound.  Musculoskeletal:        General: No tenderness. Normal range of motion.     Cervical back: Normal range of motion and neck supple.  Skin:    General: Skin is warm.  Neurological:     Mental Status: He is alert and oriented to person, place, and time.  Psychiatric:        Mood and Affect: Affect normal.    Fungal dermatitis bilateral buttocks groin/underneath abdominal pannus  LABORATORY DATA:  I have reviewed the data as listed Lab Results  Component Value Date   WBC 6.3 11/13/2022   HGB 12.4 (L) 11/13/2022   HCT 37.7 (L) 11/13/2022   MCV 95.0  11/13/2022   PLT 195 11/13/2022   Recent Labs    10/30/22 1313 11/06/22 0943 11/13/22 1026  NA 133* 135 135  K 3.4* 4.0 3.7  CL 100 103 104  CO2 25 23 23   GLUCOSE 121* 131* 114*  BUN 12 16 14   CREATININE 0.79 1.00 0.77  CALCIUM 8.1* 8.5* 8.3*  GFRNONAA >60 >60 >60  PROT 6.9 6.9 6.9  ALBUMIN 3.5 3.5 3.6  AST 65* 61* 52*  ALT 80* 76* 65*  ALKPHOS 157* 202* 172*  BILITOT 0.4 0.2* 0.2*    RADIOGRAPHIC STUDIES: I have personally reviewed the radiological images as listed and agreed with the findings in the report. MR Brain W Wo Contrast  Result Date: 11/16/2022 CLINICAL DATA:  Brain metastases, assess treatment response. EXAM: MRI HEAD WITHOUT AND WITH CONTRAST TECHNIQUE: Multiplanar, multiecho pulse sequences of the brain and surrounding structures were obtained without and with intravenous contrast. CONTRAST:  10 mL Vueway COMPARISON:  Brain MRI 08/14/2022. FINDINGS: Brain: Scattered foci of susceptibility throughout both cerebral hemispheres without significant enhancement or vasogenic edema, compatible with treated metastatic disease. Two foci of minimal residual enhancement with decreased surrounding vasogenic edema in the left precentral gyrus (image 22 series 15). Punctate focus of residual enhancement in the inferior right parietal lobe (image 64 series 100). New foci of pial enhancement along the left postcentral gyrus (image 30 series 15) and right precentral gyrus (image 135 series 15). New 4 mm focus of enhancement in the right occipital lobe (image 87 series 15). New 2 mm foci of enhancement in the left occipital lobe (image 67 series 14) and right anterior temporal lobe (image 66 series 14). New punctate focus of enhancement in the inferior right cerebellar hemisphere (image 148 series 101). New 2 mm foci of enhancement in the inferior left cerebellar hemisphere (image 147 series 101) and right mid cerebellar hemisphere (image 140 series 101). No acute infarct or intracranial  hemorrhage. No  mass effect or midline shift. No hydrocephalus or extra-axial collection. Vascular: Normal flow voids. Skull and upper cervical spine: Normal marrow signal and enhancement. Sinuses/Orbits: Paranasal sinuses, mastoid air cells, and middle ear cavities are well aerated. Orbits are unremarkable. Other: None. IMPRESSION: 1. Numerous new punctate foci of enhancement in the bilateral cerebral and cerebellar hemispheres, as detailed above and concerning for progressive metastatic disease. 2. Few foci of minimal residual enhancement in the left precentral gyrus and inferior right parietal lobe, with decreased surrounding vasogenic edema. 3. Scattered foci of susceptibility throughout both cerebral hemispheres without significant enhancement or vasogenic edema, compatible with treated metastatic disease. Electronically Signed   By: Emmit Alexanders M.D.   On: 11/16/2022 09:22   NM PET Image Restage (PS) Skull Base to Thigh (F-18 FDG)  Result Date: 11/09/2022 CLINICAL DATA:  Subsequent treatment strategy for non-small cell lung cancer. EXAM: NUCLEAR MEDICINE PET SKULL BASE TO THIGH TECHNIQUE: 12.19 mCi F-18 FDG was injected intravenously. Full-ring PET imaging was performed from the skull base to thigh after the radiotracer. CT data was obtained and used for attenuation correction and anatomic localization. Fasting blood glucose: 103 mg/dl COMPARISON:  PET-CT 08/15/2022.  Abdominal MRI 09/04/2022. FINDINGS: Mediastinal blood pool activity: SUV max 1.0 NECK: No hypermetabolic cervical lymph nodes are identified. No suspicious activity identified within the pharyngeal mucosal space. Incidental CT findings: none CHEST: There are no hypermetabolic mediastinal, hilar or axillary lymph nodes. 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 There is compressive atelectasis of the remaining left lung which appears similar to the previous study. No  residual focal hypermetabolic nodules are demonstrated. Incidental CT findings: Unchanged 6 mm right upper lobe nodule on image 88/2, too small to characterize, although likely benign based on stability. No new or enlarging nodules are identified. Previously demonstrated right basilar interstitial prominence has improved. Right IJ Port-A-Cath extends to the superior aspect of the right atrium. ABDOMEN/PELVIS: Previously demonstrated extensive heterogeneous hypermetabolic activity throughout the liver has resolved. No residual abnormal hepatic uptake is seen. There is no abnormal activity within the spleen, pancreas or adrenal glands. Bowel activity appears within physiologic limits. There is no hypermetabolic nodal activity in the abdomen or pelvis. Incidental CT findings: Previously demonstrated ascites has resolved. No peritoneal nodularity. SKELETON: There is no hypermetabolic activity to suggest osseous metastatic disease. Mild diffuse osseous activity attributed to treatment response. Incidental CT findings: none IMPRESSION: 1. Interval response to therapy with resolution of previously demonstrated extensive hypermetabolic activity throughout the liver. No residual abnormal hypermetabolic activity within the chest, abdomen or pelvis. 2. Stable chronic loculated left pleural effusion/fibrothorax with compressive atelectasis of the remaining left lung. 3. Mild diffuse osseous activity attributed to treatment response. 4. Stable small right upper lobe pulmonary nodule, likely benign based on stability. Electronically Signed   By: Richardean Sale M.D.   On: 11/09/2022 08:53    ASSESSMENT & PLAN:   Cancer of lower lobe of left lung ([place]) # STAGE IV- Left lower lobe lung cancer-adenocarcinoma the lung;  EGFR- 19 del POSITIVE.   NOV 1st, 2023- PET scan- shows progressive disease in liver.  New tracer avid upper abdominal lymph nodes compatible with nodal metastasis; New tracer avid nodule within the central left  midlung compatible with concerning for locally recurrent tumor or metastatic disease.; Similar appearance of large loculated left pleural effusion with mild overlying tracer uptake; New small volume of ascites within the right iliac fossa and central pelvis with mild adjacent thickening of the peritoneal reflections.  Progressive interlobular septal thickening within the right middle lobe and right lower lobe. Findings are nonspecific and may reflect asymmetric pulmonary edema. Lymphangitic spread of tumor is not excluded.  Recommend liver biopsy to rule out transformation. Unable to perform ultrasound-guided biopsy liver- see below. Repeat NGS testing.    Liquid Biopsy/Guardant testing-positive for EGFR mutation; otherwise Negative for any novel mutations.  Patient currently chemo-immunotherapy-carbo+ Taxol plus Avastin plus Atezo. D-2 neulasta.  S/p repeat liver Biopsy positive for adenocarcinoma. NEGATIVE for MET amplification.  S/p second opinion at [facility].   # Status post 4 cycles of carbo Taxol-Atezo+Bev- JAN 25th- Interval response to therapy with resolution of previously demonstrated extensive hypermetabolic activity throughout the liver. No residual abnormal hypermetabolic activity within the chest, abdomen or pelvis. Stable chronic loculated left pleural effusion/fibrothorax with compressive atelectasis of the remaining left lung.  Mild diffuse osseous activity attributed to treatment response.  # Proceed with maintenance- Atezo + bev every 3 weeks.  Cycle #1 today. Labs today reviewed;  acceptable for treatment today.   #Asymptomatic multiple brain mets subcentimeter asymptomatic -JUNE 29th, 2023- Numerous metastatic lesions in the supratentorial brain-increase in number also conspicuity compared to January 2023.  Pre-SBRT MRI brain AUG 18th, 2023-  Eighteen small enhancing brain metastases (punctate to 11 mm); were all present on 03/28/2021-s/p SBRT SEP 2023- [[place]].  August 15, 2022  brain MRI-overall stable.  Discussed with radiation oncology at [facility].    #Elevated LFTs: AST ALT 100 range; elevated alkaline phosphatase normal bilirubin-likely secondary progressive disease in the liver. NOV/PET scan 2023- MRI liver- . Diffuse hepatic metastatic disease with numerous lesions throughout both lobes of the liver-- monitor for now.  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 On Eliquis-5 mg BID. Stable.  #Bone metastases- CT DEc 7th, 2022 to healing process.  Dental evaluation on 9/21.  Continue calcium vitamin D intake. calcium 8.5- HOLD for now. Stable.  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  Will refill-Stable.  # IV access/ port flush.   # DISPOSITION:  # Atezo+Bev today.   # follow up in 1 week- APP- labs- cbc/cmp; possible IVFs over 1 hour  # Follow up in 3 weeks- MD; labs- cbc/cmp; CEA; UA;Atezo+Bev; TSH-. Dr.B  # I reviewed the blood work- with the patient in detail; also reviewed the imaging independently [as summarized above]; and with the patient in detail.              All questions were answered. The patient knows to call the clinic with any problems, questions or concerns.    [name], MD 11/16/2022 1:49 PM

## 2022-11-14 ENCOUNTER — Other Ambulatory Visit: Payer: Self-pay

## 2022-11-14 ENCOUNTER — Telehealth: Payer: Self-pay

## 2022-11-14 ENCOUNTER — Encounter: Payer: Self-pay | Admitting: Nurse Practitioner

## 2022-11-14 NOTE — Telephone Encounter (Signed)
Pt called concerning MRI IV access for his study in the am at [facility]. He stated they would need an order to access his port. Rn Anderson Malta called [facility] and they advised they would most likely need to reschedule his MRI if he wanted his port accessed (due to nursing planning needs) . Rn attempted to call pt back but it went to voicemail. Voicemail was left for pt explaining situation. RN will follow up on this tomorrow to see if a second call is needed (to call RN at [facility] how to place order).

## 2022-11-15 ENCOUNTER — Other Ambulatory Visit: Payer: Managed Care, Other (non HMO)

## 2022-11-15 ENCOUNTER — Other Ambulatory Visit: Payer: Self-pay | Admitting: Radiation Therapy

## 2022-11-15 ENCOUNTER — Ambulatory Visit
Admission: RE | Admit: 2022-11-15 | Discharge: 2022-11-15 | Disposition: A | Discharge status: Home / Self Care | Payer: 59 | Source: Ambulatory Visit | Attending: Radiation Oncology | Admitting: Radiation Oncology

## 2022-11-15 DIAGNOSIS — C7931 Secondary malignant neoplasm of brain: Secondary | ICD-10-CM

## 2022-11-15 LAB — CEA: CEA: 5.5 ng/mL — ABNORMAL HIGH (ref 0.0–4.7)

## 2022-11-15 MED ORDER — GADOPICLENOL 0.5 MMOL/ML IV SOLN
10.0000 mL | Freq: Once | INTRAVENOUS | Status: AC | PRN
  Administered 2022-11-15: 10 mL via INTRAVENOUS

## 2022-11-15 NOTE — Progress Notes (Signed)
Orders entered for port access at [facility] the day of brain MRI.   Mont Dutton R.T.(R)(T) Radiation Special Procedures Navigator

## 2022-11-16 ENCOUNTER — Encounter: Payer: Self-pay | Admitting: Nurse Practitioner

## 2022-11-16 ENCOUNTER — Encounter: Payer: Self-pay | Admitting: Internal Medicine

## 2022-11-19 ENCOUNTER — Telehealth: Payer: Self-pay | Admitting: Radiation Therapy

## 2022-11-19 ENCOUNTER — Inpatient Hospital Stay: Payer: 59

## 2022-11-19 ENCOUNTER — Telehealth: Payer: Self-pay | Admitting: *Deleted

## 2022-11-19 ENCOUNTER — Encounter: Payer: Self-pay | Admitting: Genetic Counselor

## 2022-11-19 DIAGNOSIS — Z79899 Other long term (current) drug therapy: Secondary | ICD-10-CM | POA: Insufficient documentation

## 2022-11-19 DIAGNOSIS — C7951 Secondary malignant neoplasm of bone: Secondary | ICD-10-CM | POA: Insufficient documentation

## 2022-11-19 DIAGNOSIS — C3432 Malignant neoplasm of lower lobe, left bronchus or lung: Secondary | ICD-10-CM | POA: Insufficient documentation

## 2022-11-19 DIAGNOSIS — R7989 Other specified abnormal findings of blood chemistry: Secondary | ICD-10-CM | POA: Insufficient documentation

## 2022-11-19 DIAGNOSIS — Z7901 Long term (current) use of anticoagulants: Secondary | ICD-10-CM | POA: Insufficient documentation

## 2022-11-19 DIAGNOSIS — Z86711 Personal history of pulmonary embolism: Secondary | ICD-10-CM | POA: Insufficient documentation

## 2022-11-19 DIAGNOSIS — Z5112 Encounter for antineoplastic immunotherapy: Secondary | ICD-10-CM | POA: Insufficient documentation

## 2022-11-19 DIAGNOSIS — C787 Secondary malignant neoplasm of liver and intrahepatic bile duct: Secondary | ICD-10-CM | POA: Insufficient documentation

## 2022-11-19 DIAGNOSIS — R030 Elevated blood-pressure reading, without diagnosis of hypertension: Secondary | ICD-10-CM | POA: Insufficient documentation

## 2022-11-19 DIAGNOSIS — C7931 Secondary malignant neoplasm of brain: Secondary | ICD-10-CM | POA: Insufficient documentation

## 2022-11-19 DIAGNOSIS — Z86718 Personal history of other venous thrombosis and embolism: Secondary | ICD-10-CM | POA: Insufficient documentation

## 2022-11-19 NOTE — Progress Notes (Incomplete)
Has armband been applied?  Yes.    Does patient have an allergy to IV contrast dye?: No.   Has patient ever received premedication for IV contrast dye?: No.   Does patient take metformin?: No.  If patient does take metformin when was the last dose: na  Date of lab work: 11-13-22 BUN: 14 CR: 0.77 eGfr: greater than 60  IV site: {iv locations:314275}  Has IV site been added to flowsheet?  {yes no:314532}  There were no vitals taken for this visit.

## 2022-11-19 NOTE — Telephone Encounter (Signed)
Call from nurse at [facility] requesting that patient port be left accessed tomorrow when he leaves here as he has an appointment in [place] with them that will require it to be accessed and he does not want to be deaccessed and re accessed there. She is asking for a return call to confirm his request

## 2022-11-19 NOTE — Progress Notes (Signed)
Radiation Oncology         [phone] ________________________________  Initial Outpatient Consultation  Name: [name] MRN: [id number]  Date: 11/20/2022  DOB: [birth year]  CC:Pcp, No  [name], *   REFERRING PHYSICIAN: Charlaine Dalton R, *  DIAGNOSIS:    ICD-10-CM   1. 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  HISTORY OF PRESENT ILLNESS:[name] is a 42 y.o. male who presents today for re-evaluation and consideration of radiation therapy in management of his recently diagnosed brain metastases from left lung cancer primary. He was last seen here for follow-up on 07/13/22.   Since I last met with the patient, he presented for a chest CT with contrast on 07/16/22 which showed an enlarging large complex thick walled left-sided pleural fluid collection. Given the hypermetabolism noted on his prior PET-CT, residual pleural disease was suspected. CT otherwise showed stable appearing chronic postradiation mass-like fibrosis in the left lower lobe, and small nonspecific right sided pulmonary nodules.    During a follow-up visit with Dr. Rogue Bussing on 07/18/22, the patient complained of worsening SOB and a mild to moderate cough. Based on his recent CT showing evidence of a worsening pleural effusion, Dr. Rogue Bussing recommended proceeding with thoracentesis.   Cytology from pleural fluid collected from left thoracentesis on 07/26/22 showed no evidence of malignancy (with numerous erythrocytes with rare reactive mesothelial cells and mixed inflammation).   During a follow-up visit with Dr. Rogue Bussing on 08/10/22, the patient  endorsed worsening chest discomfort, shortness of breath, and poor appetite.  The patient soon after presented to the symptom management clinic on 08/14/22 with several days of nausea with intermittent vomiting, and overall progressive weakness and a decrease in oral intake. He was accordingly given IV fluids and antiemetics. Labs reviewed at the time of the visit were notable for rising transaminases, most concerning for possible hepatic involvement with progressive cancer. Elevated LFT's also prompted Osimertinib to be discontinued.    Given concern for disease progression, an MRI of the brain was performed on 08/14/22 which showed stability to a slight decrease in size of numerous small enhancing lesions in the brain compatible with metastatic disease. MRI also showed increased edema in the left posterior frontal white matter associated with metastatic disease, and interval development of a mild sulcal FLAIR hyperintensity posteriorly (possibly due to subarachnoid hemorrhage, supplemental oxygen therapy, or possibly leptomeningeal carcinomatosis).    His symptoms also prompted a restaging PET scan on 08/15/22 which demonstrated: interval development of multifocal tracer avid liver lesions compatible with metastatic disease; new tracer avid upper abdominal lymph nodes compatible with nodal metastasis; a new tracer avid nodule within the central left midlung concerning for locally recurrent tumor or metastatic disease; new small volume of ascites within the right iliac fossa and central pelvis with mild adjacent thickening of the peritoneal reflections; nonspecific progressive interlobular septal thickening within the right middle lobe and right lower lobe, possibly reflective of pulmonary edema; and a similar appearing large loculated left pleural effusion with mild overlying tracer uptake.      Accordingly, the patient proceeded with chemotherapy consisting of  Atezolizumab + Bevacizumab +  Carboplatin +  Paclitaxel on 08/17/22. He required IV fluids almost weekly for post-treatment fatigue/dehydration. He completed his 4th and final cycle of treatment on 11/08/22.   Abdominal ultrasound on 08/31/22 showed no liver lesion to correlate with PET findings.  However, MRI of the liver on 09/04/22 demonstrated diffuse hepatic metastatic disease with numerous lesions throughout both lobes of the liver. 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   Restaging PET scan on 11/08/22 demonstrated an interval response to therapy with resolution of the previously demonstrated extensive hypermetabolic activity throughout the liver. PET also showed no residual abnormal hypermetabolic activity within the chest, abdomen or pelvis, and stability of a small right upper lobe pulmonary nodule.    However, his most recent MRI of the brain on 11/15/22 unfortunately demonstrated numerous new punctate foci of enhancement in the bilateral cerebral and cerebellar hemispheres concerning for progressive metastatic disease. MRI also showed a few foci of minimal residual enhancement in the left precentral gyrus and inferior right parietal lobe, with decreased surrounding vasogenic edema, and scattered foci of susceptibility throughout both cerebral hemispheres without significant enhancement or vasogenic edema, compatible with treated metastatic disease.  The patient is now on maintenance- Atezo + bev q 3 weeks. He received his first dose on 11/13/22.   Patient is present with his supportive wife. He does not vocalize any neuro-specific complaints today.   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   PORTA CATH INSERTION N/A 03/28/2021   Procedure: PORTA CATH INSERTION;  Surgeon: Katha Cabal, MD;  Location: [facility];  Service: Cardiovascular;  Laterality: N/A;   VIDEO BRONCHOSCOPY WITH ENDOBRONCHIAL NAVIGATION N/A 03/15/2021   Procedure: VIDEO BRONCHOSCOPY WITH ENDOBRONCHIAL NAVIGATION;  Surgeon: Ottie Glazier, MD;  Location: [facility];  Service: Thoracic;  Laterality: N/A;   VIDEO BRONCHOSCOPY WITH ENDOBRONCHIAL ULTRASOUND N/A 03/15/2021   Procedure: VIDEO BRONCHOSCOPY WITH ENDOBRONCHIAL ULTRASOUND;  Surgeon: Ottie Glazier, MD;  Location: [facility];  Service: Thoracic;  Laterality: N/A;    FAMILY HISTORY: family history includes High Cholesterol in his mother; Hypertension in his father; Lung cancer in his father; Skin cancer in his father.  SOCIAL HISTORY:  reports that he has never smoked. He has quit using smokeless tobacco.  His smokeless tobacco use included snuff and chew. He reports that he does not drink alcohol and does not use drugs.  ALLERGIES: Codeine  MEDICATIONS:  Current Outpatient Medications  Medication Sig Dispense Refill   apixaban (ELIQUIS) 5 MG TABS tablet Take 1 tablet (5 mg total) by mouth 2 (two) times daily. 60 tablet 2   calcium carbonate (TUMS EX) 750 MG chewable tablet Chew 1 tablet by mouth daily. (  Patient not taking: Reported on 08/27/2022)     dronabinol (MARINOL) 2.5 MG capsule Take 1 capsule (2.5 mg  total) by mouth 2 (two) times daily before lunch and supper. (Patient not taking: Reported on 11/13/2022) 60 capsule 0   folic acid (FOLVITE) 1 MG tablet TAKE 1 TABLET BY MOUTH EVERY DAY 90 tablet 1   gabapentin (NEURONTIN) 100 MG capsule Take 1 capsule (100 mg total) by mouth at bedtime. (Patient not taking: Reported on 11/13/2022) 30 capsule 1   lidocaine-prilocaine (EMLA) cream Apply 1 application topically as needed. 30 g 0   lidocaine-prilocaine (EMLA) cream Apply 1 Application topically as needed. 30 g 0   loratadine (CLARITIN) 10 MG tablet Take 10 mg by mouth daily. For 5 days after the treatment.     metoprolol succinate (TOPROL-XL) 50 MG 24 hr tablet Take 50 mg by mouth daily. Take with or immediately following a meal.     omeprazole (PRILOSEC) 20 MG capsule Take 1 capsule (20 mg total) by mouth daily. (Patient not taking: Reported on 11/13/2022) 30 capsule 2   ondansetron (ZOFRAN) 8 MG tablet One pill every 8 hours as needed for nausea/vomitting. (Patient not taking: Reported on 10/09/2022) 40 tablet 1   oxyCODONE (OXY IR/ROXICODONE) 5 MG immediate release tablet Take 1 tablet (5 mg total) by mouth every 4 (four) hours as needed for severe pain. (Patient not taking: Reported on 10/09/2022) 30 tablet 0   pegfilgrastim (NEULASTA) 6 MG/0.6ML injection Inject 6 mg into the skin once.     potassium chloride SA (KLOR-CON M) 20 MEQ tablet Take 1 tablet (20 mEq total) by mouth daily. (Patient not taking: Reported on 11/13/2022) 14 tablet 0   prochlorperazine (COMPAZINE) 10 MG tablet Take 1 tablet (10 mg total) by mouth every 6 (six) hours as needed for nausea or vomiting. (Patient not taking: Reported on 10/09/2022) 40 tablet 1   sertraline (ZOLOFT) 50 MG tablet TAKE 1/2 TABLET BY MOUTH DAILY 30 tablet 2   traMADol (ULTRAM) 50 MG tablet Take 50 mg by mouth every 6 (six) hours as needed for moderate pain or severe pain. (Patient not taking: Reported on 10/09/2022)     No current facility-administered  medications for this encounter.   Facility-Administered Medications Ordered in Other Encounters  Medication Dose Route Frequency Provider Last Rate Last Admin   heparin lock flush 100 UNIT/ML injection            heparin lock flush 100 UNIT/ML injection            sodium chloride flush (NS) 0.9 % injection 10 mL  10 mL Intravenous PRN [name], MD   10 mL at 07/28/21 0852    REVIEW OF SYSTEMS:  Notable for that above.   PHYSICAL EXAM:  height is 5\' 10"  (1.778 m) and weight is 222 lb 6.4 oz (100.9 kg). His temperature is 97.7 F (36.5 C). His blood pressure is 212/79 (abnormal) and his pulse is 84. His respiration is 20 and oxygen saturation is 98%.   General: Alert and oriented, in no acute distress  HEENT: Head is normocephalic. Extraocular movements are intact.  Heart: Regular in rate and rhythm with no murmurs, rubs, or gallops. Chest: Clear to auscultation bilaterally, with no rhonchi, wheezes, or rales. PAC in place Abdomen: Soft, nontender, nondistended, with no rigidity or guarding. Extremities: No cyanosis or edema. Lymphatics: see Neck Exam Skin: No concerning lesions. Musculoskeletal: symmetric strength and muscle tone throughout. Neurologic: Cranial nerves II through XII are  grossly intact. No obvious focalities. Speech is fluent. Coordination is intact. Psychiatric: Judgment and insight are intact. Affect is appropriate.  KPS = 90  100 - Normal; no complaints; no evidence of disease. 90   - Able to carry on normal activity; minor signs or symptoms of disease. 80   - Normal activity with effort; some signs or symptoms of disease. 12   - Cares for self; unable to carry on normal activity or to do active work. 60   - Requires occasional assistance, but is able to care for most of his personal needs. 50   - Requires considerable assistance and frequent medical care. 38   - Disabled; requires special care and assistance. 68   - Severely disabled; hospital  admission is indicated although death not imminent. 74   - Very sick; hospital admission necessary; active supportive treatment necessary. 10   - Moribund; fatal processes progressing rapidly. 0     - Dead  Karnofsky DA, Abelmann WH, Craver LS and [facility] [phone]) The use of the nitrogen mustards in the palliative treatment of carcinoma: with particular reference to bronchogenic carcinoma Cancer 1 634-56    LABORATORY DATA:  Lab Results  Component Value Date   WBC 5.7 11/20/2022   HGB 12.8 (L) 11/20/2022   HCT 39.9 11/20/2022   MCV 97.8 11/20/2022   PLT 200 11/20/2022   CMP     Component Value Date/Time   NA 136 11/20/2022 1002   K 4.1 11/20/2022 1002   CL 105 11/20/2022 1002   CO2 23 11/20/2022 1002   GLUCOSE 117 (H) 11/20/2022 1002   BUN 11 11/20/2022 1002   CREATININE 0.72 11/20/2022 1002   CALCIUM 8.5 (L) 11/20/2022 1002   PROT 7.0 11/20/2022 1002   ALBUMIN 3.6 11/20/2022 1002   AST 75 (H) 11/20/2022 1002   ALT 79 (H) 11/20/2022 1002   ALKPHOS 165 (H) 11/20/2022 1002   BILITOT 0.6 11/20/2022 1002   GFRNONAA >60 11/20/2022 1002         RADIOGRAPHY: MR Brain W Wo Contrast  Result Date: 11/16/2022 CLINICAL DATA:  Brain metastases, assess treatment response. EXAM: MRI HEAD WITHOUT AND WITH CONTRAST TECHNIQUE: Multiplanar, multiecho pulse sequences of the brain and surrounding structures were obtained without and with intravenous contrast. CONTRAST:  10 mL Vueway COMPARISON:  Brain MRI 08/14/2022. FINDINGS: Brain: Scattered foci of susceptibility throughout both cerebral hemispheres without significant enhancement or vasogenic edema, compatible with treated metastatic disease. Two foci of minimal residual enhancement with decreased surrounding vasogenic edema in the left precentral gyrus (image 22 series 15). Punctate focus of residual enhancement in the inferior right parietal lobe (image 64 series 100). New foci of pial enhancement along the left postcentral gyrus (image  30 series 15) and right precentral gyrus (image 135 series 15). New 4 mm focus of enhancement in the right occipital lobe (image 87 series 15). New 2 mm foci of enhancement in the left occipital lobe (image 67 series 14) and right anterior temporal lobe (image 66 series 14). New punctate focus of enhancement in the inferior right cerebellar hemisphere (image 148 series 101). New 2 mm foci of enhancement in the inferior left cerebellar hemisphere (image 147 series 101) and right mid cerebellar hemisphere (image 140 series 101). No acute infarct or intracranial hemorrhage. No mass effect or midline shift. No hydrocephalus or extra-axial collection. Vascular: Normal flow voids. Skull and upper cervical spine: Normal marrow signal and enhancement. Sinuses/Orbits: Paranasal sinuses, mastoid air cells, and middle ear cavities  are well aerated. Orbits are unremarkable. Other: None. IMPRESSION: 1. Numerous new punctate foci of enhancement in the bilateral cerebral and cerebellar hemispheres, as detailed above and concerning for progressive metastatic disease. 2. Few foci of minimal residual enhancement in the left precentral gyrus and inferior right parietal lobe, with decreased surrounding vasogenic edema. 3. Scattered foci of susceptibility throughout both cerebral hemispheres without significant enhancement or vasogenic edema, compatible with treated metastatic disease. Electronically Signed   By: Emmit Alexanders M.D.   On: 11/16/2022 09:22   NM PET Image Restage (PS) Skull Base to Thigh (F-18 FDG)  Result Date: 11/09/2022 CLINICAL DATA:  Subsequent treatment strategy for non-small cell lung cancer. EXAM: NUCLEAR MEDICINE PET SKULL BASE TO THIGH TECHNIQUE: 12.19 mCi F-18 FDG was injected intravenously. Full-ring PET imaging was performed from the skull base to thigh after the radiotracer. CT data was obtained and used for attenuation correction and anatomic localization. Fasting blood glucose: 103 mg/dl COMPARISON:   PET-CT 08/15/2022.  Abdominal MRI 09/04/2022. FINDINGS: Mediastinal blood pool activity: SUV max 1.0 NECK: No hypermetabolic cervical lymph nodes are identified. No suspicious activity identified within the pharyngeal mucosal space. Incidental CT findings: none CHEST: There are no hypermetabolic mediastinal, hilar or axillary lymph nodes. 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 There is compressive atelectasis of the remaining left lung which appears similar to the previous study. No residual focal hypermetabolic nodules are demonstrated. Incidental CT findings: Unchanged 6 mm right upper lobe nodule on image 88/2, too small to characterize, although likely benign based on stability. No new or enlarging nodules are identified. Previously demonstrated right basilar interstitial prominence has improved. Right IJ Port-A-Cath extends to the superior aspect of the right atrium. ABDOMEN/PELVIS: Previously demonstrated extensive heterogeneous hypermetabolic activity throughout the liver has resolved. No residual abnormal hepatic uptake is seen. There is no abnormal activity within the spleen, pancreas or adrenal glands. Bowel activity appears within physiologic limits. There is no hypermetabolic nodal activity in the abdomen or pelvis. Incidental CT findings: Previously demonstrated ascites has resolved. No peritoneal nodularity. SKELETON: There is no hypermetabolic activity to suggest osseous metastatic disease. Mild diffuse osseous activity attributed to treatment response. Incidental CT findings: none IMPRESSION: 1. Interval response to therapy with resolution of previously demonstrated extensive hypermetabolic activity throughout the liver. No residual abnormal hypermetabolic activity within the chest, abdomen or pelvis. 2. Stable chronic loculated left pleural effusion/fibrothorax with compressive atelectasis of the remaining left lung. 3. Mild  diffuse osseous activity attributed to treatment response. 4. Stable small right upper lobe pulmonary nodule, likely benign based on stability. Electronically Signed   By: Richardean Sale M.D.   On: 11/09/2022 08:53      IMPRESSION/PLAN: Left lung cancer with metastatic disease to the brain - bilateral cerebral and cerebellar hemispheres  Today, I talked to the patient about the findings and work-up thus far. Patient's recent MRI of the brain unfortunately indicated disease progression with numerous new foci. He is a good candidate for radiation to the new foci of enhancement in the cerebral and cerebellar hemispheres to decrease risk of further disease progression. We discussed the available radiation techniques, and focused on the details of logistics and delivery.   We discussed the pros and cons of SRS vs WBRT. He would like to avoid WBRT at this time and would like to proceed with SRS.  We discussed the risks, benefits, and side effects of radiosurgery to the brain. Side effects may include but not necessarily be limited to:  HA, seizure, fatigue, brain injury, inflammation or necrosis of the brain, possible need for surgical removal of tissue.  No guarantees of treatment were given. A consent form was signed and placed in the patient's medical record. The patient was encouraged to ask questions that I answered to the best of my ability. Patient is scheduled for CT simulation today.    On date of service, in total, I spent 30 minutes on this encounter. Patient was seen in person.   __________________________________________   Leona Singleton, PA   Eppie Gibson, MD  This document serves as a record of services personally performed by Eppie Gibson, MD. It was created on her behalf by Roney Mans, a trained medical scribe. The creation of this record is based on the scribe's personal observations and the provider's statements to them. This document has been checked and approved by the attending  provider.

## 2022-11-19 NOTE — Telephone Encounter (Signed)
I called and left a voicemail requesting a call back regarding his appointment with Dr. Isidore Moos on 2/6. We have made a change to the arrival time and added a simulation afterwards if he is up to staying.   Mont Dutton R.T.(R)(T) Radiation Special Procedures Navigator

## 2022-11-20 ENCOUNTER — Encounter: Payer: Self-pay | Admitting: Nurse Practitioner

## 2022-11-20 ENCOUNTER — Inpatient Hospital Stay: Payer: 59

## 2022-11-20 ENCOUNTER — Ambulatory Visit
Admission: RE | Admit: 2022-11-20 | Discharge: 2022-11-20 | Disposition: A | Discharge status: Home / Self Care | Payer: 59 | Source: Ambulatory Visit | Attending: Radiation Oncology | Admitting: Radiation Oncology

## 2022-11-20 ENCOUNTER — Other Ambulatory Visit: Payer: Self-pay

## 2022-11-20 ENCOUNTER — Encounter: Payer: Self-pay | Admitting: Radiation Oncology

## 2022-11-20 ENCOUNTER — Inpatient Hospital Stay (HOSPITAL_BASED_OUTPATIENT_CLINIC_OR_DEPARTMENT_OTHER): Payer: 59 | Admitting: Nurse Practitioner

## 2022-11-20 VITALS — BP 212/79 | HR 84 | Temp 97.7°F | Resp 20 | Ht 70.0 in | Wt 222.4 lb

## 2022-11-20 VITALS — BP 127/93 | HR 87 | Temp 97.8°F | Wt 221.0 lb

## 2022-11-20 DIAGNOSIS — Z09 Encounter for follow-up examination after completed treatment for conditions other than malignant neoplasm: Secondary | ICD-10-CM | POA: Diagnosis not present

## 2022-11-20 DIAGNOSIS — J90 Pleural effusion, not elsewhere classified: Secondary | ICD-10-CM | POA: Insufficient documentation

## 2022-11-20 DIAGNOSIS — R7989 Other specified abnormal findings of blood chemistry: Secondary | ICD-10-CM | POA: Insufficient documentation

## 2022-11-20 DIAGNOSIS — C7931 Secondary malignant neoplasm of brain: Secondary | ICD-10-CM

## 2022-11-20 DIAGNOSIS — I456 Pre-excitation syndrome: Secondary | ICD-10-CM | POA: Insufficient documentation

## 2022-11-20 DIAGNOSIS — Z510 Encounter for antineoplastic radiation therapy: Secondary | ICD-10-CM | POA: Insufficient documentation

## 2022-11-20 DIAGNOSIS — Z79899 Other long term (current) drug therapy: Secondary | ICD-10-CM | POA: Insufficient documentation

## 2022-11-20 DIAGNOSIS — I81 Portal vein thrombosis: Secondary | ICD-10-CM | POA: Insufficient documentation

## 2022-11-20 DIAGNOSIS — C787 Secondary malignant neoplasm of liver and intrahepatic bile duct: Secondary | ICD-10-CM | POA: Insufficient documentation

## 2022-11-20 DIAGNOSIS — Z86718 Personal history of other venous thrombosis and embolism: Secondary | ICD-10-CM | POA: Insufficient documentation

## 2022-11-20 DIAGNOSIS — Z7901 Long term (current) use of anticoagulants: Secondary | ICD-10-CM | POA: Insufficient documentation

## 2022-11-20 DIAGNOSIS — Z9221 Personal history of antineoplastic chemotherapy: Secondary | ICD-10-CM | POA: Insufficient documentation

## 2022-11-20 DIAGNOSIS — Z808 Family history of malignant neoplasm of other organs or systems: Secondary | ICD-10-CM | POA: Insufficient documentation

## 2022-11-20 DIAGNOSIS — R030 Elevated blood-pressure reading, without diagnosis of hypertension: Secondary | ICD-10-CM | POA: Insufficient documentation

## 2022-11-20 DIAGNOSIS — C7951 Secondary malignant neoplasm of bone: Secondary | ICD-10-CM | POA: Diagnosis not present

## 2022-11-20 DIAGNOSIS — Z86711 Personal history of pulmonary embolism: Secondary | ICD-10-CM | POA: Insufficient documentation

## 2022-11-20 DIAGNOSIS — C3432 Malignant neoplasm of lower lobe, left bronchus or lung: Secondary | ICD-10-CM | POA: Insufficient documentation

## 2022-11-20 DIAGNOSIS — Z801 Family history of malignant neoplasm of trachea, bronchus and lung: Secondary | ICD-10-CM | POA: Insufficient documentation

## 2022-11-20 DIAGNOSIS — Z5112 Encounter for antineoplastic immunotherapy: Secondary | ICD-10-CM | POA: Insufficient documentation

## 2022-11-20 LAB — CBC WITH DIFFERENTIAL/PLATELET
Abs Immature Granulocytes: 0.05 10*3/uL (ref 0.00–0.07)
Basophils Absolute: 0 10*3/uL (ref 0.0–0.1)
Basophils Relative: 1 %
Eosinophils Absolute: 0 10*3/uL (ref 0.0–0.5)
Eosinophils Relative: 1 %
HCT: 39.9 % (ref 39.0–52.0)
Hemoglobin: 12.8 g/dL — ABNORMAL LOW (ref 13.0–17.0)
Immature Granulocytes: 1 %
Lymphocytes Relative: 15 %
Lymphs Abs: 0.9 10*3/uL (ref 0.7–4.0)
MCH: 31.4 pg (ref 26.0–34.0)
MCHC: 32.1 g/dL (ref 30.0–36.0)
MCV: 97.8 fL (ref 80.0–100.0)
Monocytes Absolute: 0.8 10*3/uL (ref 0.1–1.0)
Monocytes Relative: 15 %
Neutro Abs: 3.8 10*3/uL (ref 1.7–7.7)
Neutrophils Relative %: 67 %
Platelets: 200 10*3/uL (ref 150–400)
RBC: 4.08 MIL/uL — ABNORMAL LOW (ref 4.22–5.81)
RDW: 19.6 % — ABNORMAL HIGH (ref 11.5–15.5)
WBC: 5.7 10*3/uL (ref 4.0–10.5)
nRBC: 0 % (ref 0.0–0.2)

## 2022-11-20 LAB — COMPREHENSIVE METABOLIC PANEL
ALT: 79 U/L — ABNORMAL HIGH (ref 0–44)
AST: 75 U/L — ABNORMAL HIGH (ref 15–41)
Albumin: 3.6 g/dL (ref 3.5–5.0)
Alkaline Phosphatase: 165 U/L — ABNORMAL HIGH (ref 38–126)
Anion gap: 8 (ref 5–15)
BUN: 11 mg/dL (ref 6–20)
CO2: 23 mmol/L (ref 22–32)
Calcium: 8.5 mg/dL — ABNORMAL LOW (ref 8.9–10.3)
Chloride: 105 mmol/L (ref 98–111)
Creatinine, Ser: 0.72 mg/dL (ref 0.61–1.24)
GFR, Estimated: >60 mL/min (ref >60)
Glucose, Bld: 117 mg/dL — ABNORMAL HIGH (ref 70–99)
Potassium: 4.1 mmol/L (ref 3.5–5.1)
Sodium: 136 mmol/L (ref 135–145)
Total Bilirubin: 0.6 mg/dL (ref 0.3–1.2)
Total Protein: 7 g/dL (ref 6.5–8.1)

## 2022-11-20 MED ORDER — HEPARIN SOD (PORK) LOCK FLUSH 100 UNIT/ML IV SOLN
500.0000 [IU] | Freq: Once | INTRAVENOUS | Status: AC
  Administered 2022-11-20: 500 [IU] via INTRAVENOUS

## 2022-11-20 MED ORDER — SODIUM CHLORIDE 0.9% FLUSH
10.0000 mL | Freq: Once | INTRAVENOUS | Status: AC
  Administered 2022-11-20: 10 mL via INTRAVENOUS

## 2022-11-20 NOTE — Progress Notes (Signed)
[name] NOTE  Patient Care Team: Pcp, No as PCP - General [name], RN as Oncology Nurse Navigator [name], MD as Consulting Physician (Internal Medicine)  CHIEF COMPLAINTS/PURPOSE OF CONSULTATION: Lung cancer  #  Oncology History Overview Note  IMPRESSION: 1. Patchy nodular fat stranding throughout the anterior left upper quadrant peritoneal fat, nonspecific, cannot exclude peritoneal carcinomatosis. Dedicated CT abdomen/pelvis with oral and IV contrast recommended for further evaluation. 2. Dense patchy consolidation replacing much of the left lower lung lobe, appearing masslike in the superior segment left lower lobe, with associated bulging of the left major fissure and associated left lower lobe volume loss. Fine nodularity throughout both lungs with an upper lobe predominance. Asymmetric left upper lobe interlobular septal thickening. These findings are indeterminate, with differential including multilobar pneumonia, sarcoidosis or a neoplastic process. The persistence on radiographs back to 01/20/2021 despite antibiotic therapy make sarcoidosis or a neoplastic process more likely. Pulmonology consultation suggested for consideration of bronchoscopic evaluation. 3. Small dependent left pleural effusion. 4. Mild mediastinal lymphadenopathy, nonspecific. 5. Subacute healing lateral right sixth rib fracture.   DIAGNOSIS:  A. LUNG, LEFT LOWER LOBE; ENB-ASSISTED BIOPSY:  - NON-SMALL CELL CARCINOMA, FAVOR ADENOCARCINOMA.  - FOREIGN MATERIAL SUGGESTIVE OF ASPIRATION.   # EGFR MUTATED: 44 del- June 28th, 2022- [name]. [limited]  # Asymptomatic multiple brain mets subcentimeter asymptomatic -JUNE 29th, 2023- Numerous metastatic lesions in the supratentorial brain-increase in number also conspicuity compared to January 2023.  Pre-SBRT MRI brain AUG 18th, 2023-  Eighteen small enhancing brain metastases (punctate to 11 mm); were all  present on 03/28/2021-s/p SBRT SEP 2023- [[place]]  # SEP-OCT-worsening left-sided pleural effusion-status post paracentesis; exudative; Cytology negative- ?  Progressive disease.  # DEC 2023- CARBO-TAXOL-BEV+ATEZO   Cancer of lower lobe of left lung ([place])  03/23/2021 Initial Diagnosis   Cancer of lower lobe of left lung ([place])   03/29/2021 Cancer Staging   Staging form: Lung, AJCC 8th Edition - Clinical: Stage IVB (cT3, cN3, pM1c) - Signed by [name], MD on 03/29/2021   03/30/2021 - 03/30/2021 Chemotherapy   Patient is on Treatment Plan : LUNG NSCLC Pemetrexed ([place]) / Carboplatin q21d x 1 cycles     08/17/2022 -  Chemotherapy   Patient is on Treatment Plan : LUNG Atezolizumab + Bevacizumab + Carboplatin + Paclitaxel q21d Induction x 4 cycles / Atezolizumab + Bevacizumab q21d Maintenance      HISTORY OF PRESENTING ILLNESS: Ambulating independently. Unaccompanied.   [name] 42 y.o.  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 He is concerned about results of MRI and plans to undergo brain radiation. Symptomatically feels well.   Review of Systems  Constitutional:  Negative for chills, fever, malaise/fatigue and weight loss.  HENT:  Negative for hearing loss, nosebleeds, sore throat and tinnitus.   Eyes:  Negative for blurred vision and double vision.  Respiratory:  Negative for cough, hemoptysis, shortness of breath and wheezing.   Cardiovascular:  Negative for chest pain, palpitations and leg swelling.  Gastrointestinal:  Negative for abdominal pain, blood in stool, constipation, diarrhea, melena, nausea and vomiting.  Genitourinary:  Negative for dysuria and urgency.  Musculoskeletal:  Negative for back pain, falls, joint pain and myalgias.  Skin:   Negative for itching and rash.  Neurological:  Positive for sensory  change. Negative for dizziness, tingling, loss of consciousness, weakness and headaches.  Endo/Heme/Allergies:  Negative for environmental allergies. Does not bruise/bleed easily.  Psychiatric/Behavioral:  Negative for depression. The patient is not nervous/anxious and does not have insomnia.      MEDICAL HISTORY:  Past Medical History:  Diagnosis Date   Cancer (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    SURGICAL HISTORY: Past Surgical History:  Procedure Laterality Date   FRACTURE SURGERY     broke femur when he was 6 years   PORTA CATH INSERTION N/A 03/28/2021   Procedure: PORTA CATH INSERTION;  Surgeon: Katha Cabal, MD;  Location: [facility];  Service: Cardiovascular;  Laterality: N/A;   VIDEO BRONCHOSCOPY WITH ENDOBRONCHIAL NAVIGATION N/A 03/15/2021   Procedure: VIDEO BRONCHOSCOPY WITH ENDOBRONCHIAL NAVIGATION;  Surgeon: Ottie Glazier, MD;  Location: [facility];  Service: Thoracic;  Laterality: N/A;   VIDEO BRONCHOSCOPY WITH ENDOBRONCHIAL ULTRASOUND N/A 03/15/2021   Procedure: VIDEO BRONCHOSCOPY WITH ENDOBRONCHIAL ULTRASOUND;  Surgeon: Ottie Glazier, MD;  Location: [facility];  Service: Thoracic;  Laterality: N/A;    SOCIAL HISTORY: Social History   Socioeconomic History   Marital status: Married    Spouse name: [name]   Number of children: 2   Years of education: Not on file   Highest education level: Not on file  Occupational History   Not on file  Tobacco Use   Smoking status: Never   Smokeless tobacco: Former    Types: Snuff, Chew  Vaping Use   Vaping Use: Never used  Substance and Sexual Activity   Alcohol use: Never   Drug use: Never   Sexual activity: Yes  Other Topics Concern   Not on file  Social History Narrative   [place]ives in [place]; with wife; 2 daughters[12 and 22]; never smoked; rare alcohol. Work in saw [employer]  Dog and cat.   Social Determinants of Health   Financial Resource Strain: Low Risk  (05/10/2022)   Overall Financial Resource Strain (CARDIA)    Difficulty of Paying Living Expenses: Not hard at all  Food Insecurity: No Food Insecurity (05/10/2022)   Hunger Vital Sign    Worried About Running Out of Food in the Last Year: Never true    Ran Out of Food in the Last Year: Never true  Transportation Needs: No Transportation Needs (03/16/2022)   PRAPARE - Hydrologist (Medical): No    Lack of Transportation (Non-Medical): No  Physical Activity: Not on file  Stress: Not on file  Social Connections: Socially Integrated (05/10/2022)   Social Connection and Isolation Panel [NHANES]    Frequency of Communication with Friends and Family: More than three times a week    Frequency of Social Gatherings with Friends and Family: More than three times a week    Attends Religious Services: More than 4 times per year    Active Member of [employer] or Organizations: Yes    Attends Music therapist: More than 4 times per year    Marital Status: Married  Human resources officer Violence: Not on file    FAMILY HISTORY: Family History  Problem Relation Age of Onset   High Cholesterol Mother    Hypertension Father    Lung cancer Father    Skin cancer Father     ALLERGIES:  is allergic to codeine.  MEDICATIONS:  Current Outpatient Medications  Medication  Sig Dispense Refill   apixaban (ELIQUIS) 5 MG TABS tablet Take 1 tablet (5 mg total) by mouth 2 (two) times daily. 60 tablet 2   folic acid (FOLVITE) 1 MG tablet TAKE 1 TABLET BY MOUTH EVERY DAY 90 tablet 1   lidocaine-prilocaine (EMLA) cream Apply 1 application topically as needed. 30 g 0   lidocaine-prilocaine (EMLA) cream Apply 1 Application topically as needed. 30 g 0   loratadine (CLARITIN) 10 MG tablet Take 10 mg by mouth daily. For 5 days after the treatment.     metoprolol succinate (TOPROL-XL) 50 MG 24 hr tablet  Take 50 mg by mouth daily. Take with or immediately following a meal.     pegfilgrastim (NEULASTA) 6 MG/0.6ML injection Inject 6 mg into the skin once.     sertraline (ZOLOFT) 50 MG tablet TAKE 1/2 TABLET BY MOUTH DAILY 30 tablet 2   calcium carbonate (TUMS EX) 750 MG chewable tablet Chew 1 tablet by mouth daily. (Patient not taking: Reported on 08/27/2022)     dronabinol (MARINOL) 2.5 MG capsule Take 1 capsule (2.5 mg total) by mouth 2 (two) times daily before lunch and supper. (Patient not taking: Reported on 11/13/2022) 60 capsule 0   gabapentin (NEURONTIN) 100 MG capsule Take 1 capsule (100 mg total) by mouth at bedtime. (Patient not taking: Reported on 11/13/2022) 30 capsule 1   omeprazole (PRILOSEC) 20 MG capsule Take 1 capsule (20 mg total) by mouth daily. (Patient not taking: Reported on 11/13/2022) 30 capsule 2   ondansetron (ZOFRAN) 8 MG tablet One pill every 8 hours as needed for nausea/vomitting. (Patient not taking: Reported on 10/09/2022) 40 tablet 1   oxyCODONE (OXY IR/ROXICODONE) 5 MG immediate release tablet Take 1 tablet (5 mg total) by mouth every 4 (four) hours as needed for severe pain. (Patient not taking: Reported on 10/09/2022) 30 tablet 0   potassium chloride SA (KLOR-CON M) 20 MEQ tablet Take 1 tablet (20 mEq total) by mouth daily. (Patient not taking: Reported on 11/13/2022) 14 tablet 0   prochlorperazine (COMPAZINE) 10 MG tablet Take 1 tablet (10 mg total) by mouth every 6 (six) hours as needed for nausea or vomiting. (Patient not taking: Reported on 10/09/2022) 40 tablet 1   traMADol (ULTRAM) 50 MG tablet Take 50 mg by mouth every 6 (six) hours as needed for moderate pain or severe pain. (Patient not taking: Reported on 10/09/2022)     No current facility-administered medications for this visit.   Facility-Administered Medications Ordered in Other Visits  Medication Dose Route Frequency Provider Last Rate Last Admin   heparin lock flush 100 UNIT/ML injection             heparin lock flush 100 UNIT/ML injection            sodium chloride flush (NS) 0.9 % injection 10 mL  10 mL Intravenous PRN [name], MD   10 mL at 07/28/21 0852      PHYSICAL EXAMINATION: ECOG PERFORMANCE STATUS: 1 - Symptomatic but completely ambulatory  Vitals:   11/20/22 1037  BP: (!) 127/93  Pulse: 87  Temp: 97.8 F (36.6 C)  SpO2: 99%   Filed Weights   11/20/22 1037  Weight: 221 lb (100.2 kg)   Physical Exam Constitutional:      Appearance: He is not ill-appearing.  Cardiovascular:     Rate and Rhythm: Normal rate and regular rhythm.  Abdominal:     General: There is no distension.     Palpations: Abdomen  is soft.     Tenderness: There is no abdominal tenderness. There is no guarding.  Musculoskeletal:        General: No deformity.     Right lower leg: No edema.     Left lower leg: No edema.  Skin:    General: Skin is warm and dry.     Comments: Ruddy skin tone  Neurological:     Mental Status: He is alert and oriented to person, place, and time. Mental status is at baseline.  Psychiatric:        Mood and Affect: Mood normal.        Behavior: Behavior normal.     LABORATORY DATA:  I have reviewed the data as listed Lab Results  Component Value Date   WBC 5.7 11/20/2022   HGB 12.8 (L) 11/20/2022   HCT 39.9 11/20/2022   MCV 97.8 11/20/2022   PLT 200 11/20/2022   Recent Labs    11/06/22 0943 11/13/22 1026 11/20/22 1002  NA 135 135 136  K 4.0 3.7 4.1  CL 103 104 105  CO2 23 23 23   GLUCOSE 131* 114* 117*  BUN 16 14 11   CREATININE 1.00 0.77 0.72  CALCIUM 8.5* 8.3* 8.5*  GFRNONAA >60 >60 >60  PROT 6.9 6.9 7.0  ALBUMIN 3.5 3.6 3.6  AST 61* 52* 75*  ALT 76* 65* 79*  ALKPHOS 202* 172* 165*  BILITOT 0.2* 0.2* 0.6     RADIOGRAPHIC STUDIES: I have personally reviewed the radiological images as listed and agreed with the findings in the report. MR Brain W Wo Contrast  Result Date: 11/16/2022 CLINICAL DATA:  Brain metastases, assess  treatment response. EXAM: MRI HEAD WITHOUT AND WITH CONTRAST TECHNIQUE: Multiplanar, multiecho pulse sequences of the brain and surrounding structures were obtained without and with intravenous contrast. CONTRAST:  10 mL Vueway COMPARISON:  Brain MRI 08/14/2022. FINDINGS: Brain: Scattered foci of susceptibility throughout both cerebral hemispheres without significant enhancement or vasogenic edema, compatible with treated metastatic disease. Two foci of minimal residual enhancement with decreased surrounding vasogenic edema in the left precentral gyrus (image 22 series 15). Punctate focus of residual enhancement in the inferior right parietal lobe (image 64 series 100). New foci of pial enhancement along the left postcentral gyrus (image 30 series 15) and right precentral gyrus (image 135 series 15). New 4 mm focus of enhancement in the right occipital lobe (image 87 series 15). New 2 mm foci of enhancement in the left occipital lobe (image 67 series 14) and right anterior temporal lobe (image 66 series 14). New punctate focus of enhancement in the inferior right cerebellar hemisphere (image 148 series 101). New 2 mm foci of enhancement in the inferior left cerebellar hemisphere (image 147 series 101) and right mid cerebellar hemisphere (image 140 series 101). No acute infarct or intracranial hemorrhage. No mass effect or midline shift. No hydrocephalus or extra-axial collection. Vascular: Normal flow voids. Skull and upper cervical spine: Normal marrow signal and enhancement. Sinuses/Orbits: Paranasal sinuses, mastoid air cells, and middle ear cavities are well aerated. Orbits are unremarkable. Other: None. IMPRESSION: 1. Numerous new punctate foci of enhancement in the bilateral cerebral and cerebellar hemispheres, as detailed above and concerning for progressive metastatic disease. 2. Few foci of minimal residual enhancement in the left precentral gyrus and inferior right parietal lobe, with decreased surrounding  vasogenic edema. 3. Scattered foci of susceptibility throughout both cerebral hemispheres without significant enhancement or vasogenic edema, compatible with treated metastatic disease. Electronically Signed   By:  Emmit Alexanders M.D.   On: 11/16/2022 09:22   NM PET Image Restage (PS) Skull Base to Thigh (F-18 FDG)  Result Date: 11/09/2022 CLINICAL DATA:  Subsequent treatment strategy for non-small cell lung cancer. EXAM: NUCLEAR MEDICINE PET SKULL BASE TO THIGH TECHNIQUE: 12.19 mCi F-18 FDG was injected intravenously. Full-ring PET imaging was performed from the skull base to thigh after the radiotracer. CT data was obtained and used for attenuation correction and anatomic localization. Fasting blood glucose: 103 mg/dl COMPARISON:  PET-CT 08/15/2022.  Abdominal MRI 09/04/2022. FINDINGS: Mediastinal blood pool activity: SUV max 1.0 NECK: No hypermetabolic cervical lymph nodes are identified. No suspicious activity identified within the pharyngeal mucosal space. Incidental CT findings: none CHEST: There are no hypermetabolic mediastinal, hilar or axillary lymph nodes. 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 There is compressive atelectasis of the remaining left lung which appears similar to the previous study. No residual focal hypermetabolic nodules are demonstrated. Incidental CT findings: Unchanged 6 mm right upper lobe nodule on image 88/2, too small to characterize, although likely benign based on stability. No new or enlarging nodules are identified. Previously demonstrated right basilar interstitial prominence has improved. Right IJ Port-A-Cath extends to the superior aspect of the right atrium. ABDOMEN/PELVIS: Previously demonstrated extensive heterogeneous hypermetabolic activity throughout the liver has resolved. No residual abnormal hepatic uptake is seen. There is no abnormal activity within the spleen, pancreas or adrenal glands.  Bowel activity appears within physiologic limits. There is no hypermetabolic nodal activity in the abdomen or pelvis. Incidental CT findings: Previously demonstrated ascites has resolved. No peritoneal nodularity. SKELETON: There is no hypermetabolic activity to suggest osseous metastatic disease. Mild diffuse osseous activity attributed to treatment response. Incidental CT findings: none IMPRESSION: 1. Interval response to therapy with resolution of previously demonstrated extensive hypermetabolic activity throughout the liver. No residual abnormal hypermetabolic activity within the chest, abdomen or pelvis. 2. Stable chronic loculated left pleural effusion/fibrothorax with compressive atelectasis of the remaining left lung. 3. Mild diffuse osseous activity attributed to treatment response. 4. Stable small right upper lobe pulmonary nodule, likely benign based on stability. Electronically Signed   By: Richardean Sale M.D.   On: 11/09/2022 08:53    ASSESSMENT & PLAN:   No problem-specific Assessment & Plan notes found for this encounter.  Cancer of lower lobe of left lung ([place]) # STAGE IV- Left lower lobe lung cancer-adenocarcinoma the lung; Status post 4 cycles of carbo Taxol-Atezo+Bev- JAN 25th- Interval response to therapy with resolution of previously demonstrated extensive hypermetabolic activity throughout the liver. No residual abnormal hypermetabolic activity within the chest, abdomen or pelvis. Stable chronic loculated left pleural effusion/fibrothorax with compressive atelectasis of the remaining left lung.  Mild diffuse osseous activity attributed to treatment response. Plan is to proceed with maintenance- atezo + bev every 3 weeks. He is s/p cycle 1. Tolerated well.   # Asymptomatic brain metastases- plans to undergo brain radiation at [facility]  # Elevated LFTs- likely secondary to disease of liver and effect of chemotherapy. Slight bump today. Recommended monitoring given improvement in disease  burden on imaging. More likely related to treatment.   # Elevated BP- sec to Avastin 127/93- monitor.    # Left lung PE - incidental on CT 06/27/21- continue eliquis 5 mg BID.    #Bone metastases- clinically asymptomatic. Continue calcium and vitamin d.    # Cardiac arrhythmia-SVT s/p adenosine post port [June 2022]; Hx of WPW. Stable. Continue metoprolol   # IV access/ port  flush.    DISPOSITION:  Hold iv fluids today Leave port accessed for [place]/radiation Follow up as scheduled- [place]   All questions were answered. The patient knows to call the clinic with any problems, questions or concerns.   Verlon Au, NP 11/20/2022

## 2022-11-23 DIAGNOSIS — Z510 Encounter for antineoplastic radiation therapy: Secondary | ICD-10-CM | POA: Diagnosis not present

## 2022-11-26 ENCOUNTER — Other Ambulatory Visit: Payer: Self-pay

## 2022-11-26 ENCOUNTER — Ambulatory Visit
Admission: RE | Admit: 2022-11-26 | Discharge: 2022-11-26 | Disposition: A | Discharge status: Home / Self Care | Payer: 59 | Source: Ambulatory Visit | Attending: Radiation Oncology | Admitting: Radiation Oncology

## 2022-11-26 DIAGNOSIS — Z510 Encounter for antineoplastic radiation therapy: Secondary | ICD-10-CM | POA: Diagnosis not present

## 2022-11-26 LAB — RAD ONC ARIA SESSION SUMMARY
Course Elapsed Days: 0
Plan Fractions Treated to Date: 1
Plan Prescribed Dose Per Fraction: 20 Gy
Plan Total Fractions Prescribed: 1
Plan Total Prescribed Dose: 20 Gy
Reference Point Dosage Given to Date: 20 Gy
Reference Point Session Dosage Given: 20 Gy
Session Number: 1

## 2022-11-26 NOTE — Progress Notes (Signed)
Mr. [name] rested with [place] for 15 minutes following his SRS treatment.  Patient denies headache, dizziness, nausea, diplopia or ringing in the ears. Denies fatigue. Patient without complaints. Understands to avoid strenuous activity for the next 24 hours and call [phone] with needs.   BP 130/85 (BP Location: Left Arm, Patient Position: Sitting, Cuff Size: Normal)   Pulse 86   Temp 97.9 F (36.6 C)   Resp 20   SpO2 99%     [name] M. Leonie Green, BSN

## 2022-11-26 NOTE — Op Note (Signed)
  Name: [name]  MRN: [id number]  Date: 11/26/2022   DOB: 14-May-1981  Stereotactic Radiosurgery Operative Note  PRE-OPERATIVE DIAGNOSIS:  Multiple Brain Metastases  POST-OPERATIVE DIAGNOSIS:  Multiple Brain Metastases  PROCEDURE:  Stereotactic Radiosurgery  SURGEON:  Charlie Pitter, MD  NARRATIVE: The patient underwent a radiation treatment planning session in the radiation oncology simulation suite under the care of the radiation oncology physician and physicist.  I participated closely in the radiation treatment planning afterwards. The patient underwent planning CT which was fused to 3T high resolution MRI with 1 mm axial slices.  These images were fused on the planning system.  We contoured the gross target volumes and subsequently expanded this to yield the Planning Target Volume. I actively participated in the planning process.  I helped to define and review the target contours and also the contours of the optic pathway, eyes, brainstem and selected nearby organs at risk.  All the dose constraints for critical structures were reviewed and compared to AAPM Task Group 101.  The prescription dose conformity was reviewed.  I approved the plan electronically.    Accordingly, [name] was brought to the TrueBeam stereotactic radiation treatment linac and placed in the custom immobilization mask.  The patient was aligned according to the IR fiducial markers with BrainLab Exactrac, then orthogonal x-rays were used in ExacTrac with the 6DOF robotic table and the shifts were made to align the patient  [name] received stereotactic radiosurgery uneventfully.    Lesions treated:  13   Complex lesions treated:  0 (>3.5 cm, <16mm of optic path, or within the brainstem)   The detailed description of the procedure is recorded in the radiation oncology procedure note.  I was present for the duration of the procedure.  DISPOSITION:  Following delivery, the patient was transported to  nursing in stable condition and monitored for possible acute effects to be discharged to home in stable condition with follow-up in one month.  Charlie Pitter, MD 11/26/2022 1:35 PM

## 2022-11-29 NOTE — Radiation Completion Notes (Signed)
Patient Name: [name], [name] MRN: [id number] Date of Birth: 1981-01-15 Referring Physician: Charlaine Dalton, M.D. Date of Service: 2022-11-29 Radiation Oncologist: Eppie Gibson, M.D. [place]                             RADIATION ONCOLOGY END OF TREATMENT NOTE     Diagnosis: C79.31 Secondary malignant neoplasm of brain Staging on 2021-03-29: Cancer of lower lobe of left lung ([place]) T=cT3, N=cN3, M=pM1c Intent: Palliative     ==========DELIVERED PLANS==========  First Treatment Date: 2022-11-26 - Last Treatment Date: 2022-11-26   Plan Name: BrainSRS Site: Brain Technique: SBRT/SRT-IMRT Mode: Photon Dose Per Fraction: 20 Gy Prescribed Dose (Delivered / Prescribed): 20 Gy / 20 Gy Prescribed Fxs (Delivered / Prescribed): 1 / 1     ==========ON TREATMENT VISIT DATES========== 2022-11-26     ==========UPCOMING VISITS==========       ==========APPENDIX - ON TREATMENT VISIT NOTES==========   See weekly On Treatment Notes is Epic for details.

## 2022-12-04 ENCOUNTER — Inpatient Hospital Stay: Payer: 59

## 2022-12-04 ENCOUNTER — Encounter: Payer: Self-pay | Admitting: Internal Medicine

## 2022-12-04 ENCOUNTER — Inpatient Hospital Stay (HOSPITAL_BASED_OUTPATIENT_CLINIC_OR_DEPARTMENT_OTHER): Payer: 59 | Admitting: Internal Medicine

## 2022-12-04 VITALS — BP 129/90 | HR 70 | Resp 18 | Ht 70.0 in | Wt 221.0 lb

## 2022-12-04 VITALS — Temp 97.2°F

## 2022-12-04 DIAGNOSIS — Z510 Encounter for antineoplastic radiation therapy: Secondary | ICD-10-CM | POA: Diagnosis not present

## 2022-12-04 DIAGNOSIS — C3432 Malignant neoplasm of lower lobe, left bronchus or lung: Secondary | ICD-10-CM

## 2022-12-04 LAB — URINALYSIS, DIPSTICK ONLY
Bilirubin Urine: NEGATIVE
Glucose, UA: NEGATIVE mg/dL
Hgb urine dipstick: NEGATIVE
Ketones, ur: NEGATIVE mg/dL
Leukocytes,Ua: NEGATIVE
Nitrite: NEGATIVE
Protein, ur: NEGATIVE mg/dL
Specific Gravity, Urine: 1.019 (ref 1.005–1.030)
pH: 5 (ref 5.0–8.0)

## 2022-12-04 LAB — COMPREHENSIVE METABOLIC PANEL
ALT: 69 U/L — ABNORMAL HIGH (ref 0–44)
AST: 56 U/L — ABNORMAL HIGH (ref 15–41)
Albumin: 3.8 g/dL (ref 3.5–5.0)
Alkaline Phosphatase: 169 U/L — ABNORMAL HIGH (ref 38–126)
Anion gap: 8 (ref 5–15)
BUN: 14 mg/dL (ref 6–20)
CO2: 24 mmol/L (ref 22–32)
Calcium: 8.9 mg/dL (ref 8.9–10.3)
Chloride: 105 mmol/L (ref 98–111)
Creatinine, Ser: 0.97 mg/dL (ref 0.61–1.24)
GFR, Estimated: >60 mL/min (ref >60)
Glucose, Bld: 94 mg/dL (ref 70–99)
Potassium: 4 mmol/L (ref 3.5–5.1)
Sodium: 137 mmol/L (ref 135–145)
Total Bilirubin: 0.8 mg/dL (ref 0.3–1.2)
Total Protein: 7.6 g/dL (ref 6.5–8.1)

## 2022-12-04 LAB — CBC WITH DIFFERENTIAL/PLATELET
Abs Immature Granulocytes: 0.04 10*3/uL (ref 0.00–0.07)
Basophils Absolute: 0 10*3/uL (ref 0.0–0.1)
Basophils Relative: 1 %
Eosinophils Absolute: 0.2 10*3/uL (ref 0.0–0.5)
Eosinophils Relative: 2 %
HCT: 43.7 % (ref 39.0–52.0)
Hemoglobin: 14.2 g/dL (ref 13.0–17.0)
Immature Granulocytes: 1 %
Lymphocytes Relative: 14 %
Lymphs Abs: 1 10*3/uL (ref 0.7–4.0)
MCH: 31.7 pg (ref 26.0–34.0)
MCHC: 32.5 g/dL (ref 30.0–36.0)
MCV: 97.5 fL (ref 80.0–100.0)
Monocytes Absolute: 0.8 10*3/uL (ref 0.1–1.0)
Monocytes Relative: 11 %
Neutro Abs: 5 10*3/uL (ref 1.7–7.7)
Neutrophils Relative %: 71 %
Platelets: 183 10*3/uL (ref 150–400)
RBC: 4.48 MIL/uL (ref 4.22–5.81)
RDW: 15.9 % — ABNORMAL HIGH (ref 11.5–15.5)
WBC: 7 10*3/uL (ref 4.0–10.5)
nRBC: 0 % (ref 0.0–0.2)

## 2022-12-04 LAB — TSH: TSH: 1.908 u[IU]/mL (ref 0.350–4.500)

## 2022-12-04 MED ORDER — HEPARIN SOD (PORK) LOCK FLUSH 100 UNIT/ML IV SOLN
500.0000 [IU] | Freq: Once | INTRAVENOUS | Status: DC | PRN
  Filled 2022-12-04: qty 5

## 2022-12-04 MED ORDER — HEPARIN SOD (PORK) LOCK FLUSH 100 UNIT/ML IV SOLN
500.0000 [IU] | Freq: Once | INTRAVENOUS | Status: AC
  Administered 2022-12-04: 500 [IU] via INTRAVENOUS
  Filled 2022-12-04: qty 5

## 2022-12-04 MED ORDER — SODIUM CHLORIDE 0.9 % IV SOLN
1200.0000 mg | Freq: Once | INTRAVENOUS | Status: AC
  Administered 2022-12-04: 1200 mg via INTRAVENOUS
  Filled 2022-12-04: qty 20

## 2022-12-04 MED ORDER — SODIUM CHLORIDE 0.9 % IV SOLN
15.0000 mg/kg | Freq: Once | INTRAVENOUS | Status: AC
  Administered 2022-12-04: 1600 mg via INTRAVENOUS
  Filled 2022-12-04: qty 64

## 2022-12-04 MED ORDER — SODIUM CHLORIDE 0.9 % IV SOLN
Freq: Once | INTRAVENOUS | Status: AC
  Filled 2022-12-04: qty 250

## 2022-12-04 MED ORDER — SODIUM CHLORIDE 0.9% FLUSH
10.0000 mL | Freq: Once | INTRAVENOUS | Status: AC
  Administered 2022-12-04: 10 mL via INTRAVENOUS
  Filled 2022-12-04: qty 10

## 2022-12-04 NOTE — Progress Notes (Signed)
[name]  Patient Care Team: Pcp, No as PCP - General Nathan Nab, RN as Oncology Nurse Navigator Nathan Sickle, MD as Consulting Physician (Internal Medicine)  CHIEF COMPLAINTS/PURPOSE OF CONSULTATION: Lung cancer  #  Oncology History Overview Note  IMPRESSION: 1. Patchy nodular fat stranding throughout the anterior left upper quadrant peritoneal fat, nonspecific, cannot exclude peritoneal carcinomatosis. Dedicated CT abdomen/pelvis with oral and IV contrast recommended for further evaluation. 2. Dense patchy consolidation replacing much of the left lower lung lobe, appearing masslike in the superior segment left lower lobe, with associated bulging of the left major fissure and associated left lower lobe volume loss. Fine nodularity throughout both lungs with an upper lobe predominance. Asymmetric left upper lobe interlobular septal thickening. These findings are indeterminate, with differential including multilobar pneumonia, sarcoidosis or a neoplastic process. The persistence on radiographs back to 01/20/2021 despite antibiotic therapy make sarcoidosis or a neoplastic process more likely. Pulmonology consultation suggested for consideration of bronchoscopic evaluation. 3. Small dependent left pleural effusion. 4. Mild mediastinal lymphadenopathy, nonspecific. 5. Subacute healing lateral right sixth rib fracture.   DIAGNOSIS:  A. LUNG, LEFT LOWER LOBE; ENB-ASSISTED BIOPSY:  - NON-SMALL CELL CARCINOMA, FAVOR ADENOCARCINOMA.  - FOREIGN MATERIAL SUGGESTIVE OF ASPIRATION.   # EGFR MUTATED: 71 del- June 28th, 2022- [name]. [limited]  # Asymptomatic multiple brain mets subcentimeter asymptomatic -JUNE 29th, 2023- Numerous metastatic lesions in the supratentorial brain-increase in number also conspicuity compared to January 2023.  Pre-SBRT MRI brain AUG 18th, 2023-  Eighteen small enhancing brain metastases (punctate to 11 mm); were all  present on 03/28/2021-s/p SBRT SEP 2023- [[place]]  # SEP-OCT-worsening left-sided pleural effusion-status post paracentesis; exudative; Cytology negative- ?  Progressive disease.  # DEC 2023- CARBO-TAXOL-BEV+ATEZO   Cancer of lower lobe of left lung ([place])  03/23/2021 Initial Diagnosis   Cancer of lower lobe of left lung ([place])   03/29/2021 Cancer Staging   Staging form: Lung, AJCC 8th Edition - Clinical: Stage IVB (cT3, cN3, pM1c) - Signed by Nathan Sickle, MD on 03/29/2021   03/30/2021 - 03/30/2021 Chemotherapy   Patient is on Treatment Plan : LUNG NSCLC Pemetrexed (Alimta) / Carboplatin q21d x 1 cycles     08/17/2022 -  Chemotherapy   Patient is on Treatment Plan : LUNG Atezolizumab + Bevacizumab + Carboplatin + Paclitaxel q21d Induction x 4 cycles / Atezolizumab + Bevacizumab q21d Maintenance      HISTORY OF PRESENTING ILLNESS: Ambulating independently.  Alone.   [name] 42 y.o.  male patient with stage IV lung cancer adenocarcinoma-brain mets [synchronous] bone mets EGFR mutated progressed  on osimertinib-currently on maintenance Atezo-Bev is here for follow-up.   In the interim patient was evaluated in symptom management.-For follow-up did not need any IV fluids.  No recent admission to hospital.  Patient reports a little numbness/tingling in feet that comes and goes, hands are better.   Intermittent nosebleeds as patient the morning on blowing.   In the interim evaluated by radiation oncology in [facility] s/p radiation-[facility].  Appetite is better.   Abdominal pain improved.  Denies any worsening headaches. Denies any worsening pain.  No worsening skin rash.   Review of Systems  Constitutional:  Positive for malaise/fatigue. Negative for chills, diaphoresis and fever.  HENT:  Negative for nosebleeds and sore throat.   Eyes:  Negative for double vision.  Respiratory:  Positive for cough. Negative for wheezing.   Cardiovascular:  Negative for chest pain, palpitations,  orthopnea and leg swelling.  Gastrointestinal:  Positive for heartburn and nausea. Negative for abdominal pain, blood in stool, diarrhea and melena.  Genitourinary:  Negative for dysuria, frequency and urgency.  Musculoskeletal:  Negative for back pain and joint pain.  Skin:  Negative for itching.  Neurological:  Positive for dizziness, weakness and headaches. Negative for tingling and focal weakness.  Endo/Heme/Allergies:  Does not bruise/bleed easily.  Psychiatric/Behavioral:  Negative for depression. The patient is not nervous/anxious and does not have insomnia.      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    SURGICAL HISTORY: Past Surgical History:  Procedure Laterality Date   FRACTURE SURGERY     broke femur when he was 6 years   PORTA CATH INSERTION N/A 03/28/2021   Procedure: PORTA CATH INSERTION;  Surgeon: Nathan Cabal, MD;  Location: [facility];  Service: Cardiovascular;  Laterality: N/A;   VIDEO BRONCHOSCOPY WITH ENDOBRONCHIAL NAVIGATION N/A 03/15/2021   Procedure: VIDEO BRONCHOSCOPY WITH ENDOBRONCHIAL NAVIGATION;  Surgeon: Nathan Glazier, MD;  Location: [facility];  Service: Thoracic;  Laterality: N/A;   VIDEO BRONCHOSCOPY WITH ENDOBRONCHIAL ULTRASOUND N/A 03/15/2021   Procedure: VIDEO BRONCHOSCOPY WITH ENDOBRONCHIAL ULTRASOUND;  Surgeon: Nathan Glazier, MD;  Location: [facility];  Service: Thoracic;  Laterality: N/A;    SOCIAL HISTORY: Social History   Socioeconomic History   Marital status: Married    Spouse name: [name]   Number of children: 2   Years of education: Not on file   Highest education level: Not on file  Occupational History   Not on file  Tobacco Use   Smoking status: Never   Smokeless tobacco: Former    Types: Snuff, Chew  Vaping Use   Vaping Use: Never used  Substance and Sexual Activity   Alcohol use:  Never   Drug use: Never   Sexual activity: Yes  Other Topics Concern   Not on file  Social History Narrative   [place]ives in [place]; with wife; 2 daughters[12 and 22]; never smoked; rare alcohol. 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   Social Determinants of Health   Financial Resource Strain: Low Risk  (05/10/2022)   Overall Financial Resource Strain (CARDIA)    Difficulty of Paying Living Expenses: Not hard at all  Food Insecurity: No Food Insecurity (05/10/2022)   Hunger Vital Sign    Worried About Running Out of Food in the Last Year: Never true    Ran Out of Food in the Last Year: Never true  Transportation Needs: No Transportation Needs (03/16/2022)   PRAPARE - Hydrologist (Medical): No    Lack of Transportation (Non-Medical): No  Physical Activity: Not on file  Stress: Not on file  Social Connections: Socially Integrated (05/10/2022)   Social Connection and Isolation Panel [NHANES]    Frequency of Communication with Friends and Family: More than three times a week    Frequency of Social Gatherings with Friends and Family: More than three times a week    Attends Religious Services: More than 4 times per year    Active Member of [employer] or Organizations: Yes    Attends Music therapist: More than 4 times per year    Marital Status: Married  Human resources officer Violence: Not on file    FAMILY HISTORY: Family History  Problem Relation Age  of Onset   High Cholesterol Mother    Hypertension Father    Lung cancer Father    Skin cancer Father     ALLERGIES:  is allergic to codeine.  MEDICATIONS:  Current Outpatient Medications  Medication Sig Dispense Refill   apixaban (ELIQUIS) 5 MG TABS tablet Take 1 tablet (5 mg total) by mouth 2 (two) times daily. 60 tablet 2   folic acid (FOLVITE) 1 MG tablet TAKE 1 TABLET BY MOUTH EVERY DAY 90 tablet 1   metoprolol succinate (TOPROL-XL) 50 MG 24 hr tablet Take 50 mg by mouth daily. Take with or immediately  following a meal.     calcium carbonate (TUMS EX) 750 MG chewable tablet Chew 1 tablet by mouth daily. (Patient not taking: Reported on 08/27/2022)     dronabinol (MARINOL) 2.5 MG capsule Take 1 capsule (2.5 mg total) by mouth 2 (two) times daily before lunch and supper. (Patient not taking: Reported on 11/13/2022) 60 capsule 0   gabapentin (NEURONTIN) 100 MG capsule Take 1 capsule (100 mg total) by mouth at bedtime. (Patient not taking: Reported on 11/13/2022) 30 capsule 1   lidocaine-prilocaine (EMLA) cream Apply 1 application topically as needed. (Patient not taking: Reported on 12/04/2022) 30 g 0   lidocaine-prilocaine (EMLA) cream Apply 1 Application topically as needed. (Patient not taking: Reported on 12/04/2022) 30 g 0   loratadine (CLARITIN) 10 MG tablet Take 10 mg by mouth daily. For 5 days after the treatment. (Patient not taking: Reported on 12/04/2022)     omeprazole (PRILOSEC) 20 MG capsule Take 1 capsule (20 mg total) by mouth daily. (Patient not taking: Reported on 11/13/2022) 30 capsule 2   ondansetron (ZOFRAN) 8 MG tablet One pill every 8 hours as needed for nausea/vomitting. (Patient not taking: Reported on 10/09/2022) 40 tablet 1   oxyCODONE (OXY IR/ROXICODONE) 5 MG immediate release tablet Take 1 tablet (5 mg total) by mouth every 4 (four) hours as needed for severe pain. (Patient not taking: Reported on 10/09/2022) 30 tablet 0   pegfilgrastim (NEULASTA) 6 MG/0.6ML injection Inject 6 mg into the skin once. (Patient not taking: Reported on 12/04/2022)     potassium chloride SA (KLOR-CON M) 20 MEQ tablet Take 1 tablet (20 mEq total) by mouth daily. (Patient not taking: Reported on 11/13/2022) 14 tablet 0   prochlorperazine (COMPAZINE) 10 MG tablet Take 1 tablet (10 mg total) by mouth every 6 (six) hours as needed for nausea or vomiting. (Patient not taking: Reported on 10/09/2022) 40 tablet 1   sertraline (ZOLOFT) 50 MG tablet TAKE 1/2 TABLET BY MOUTH DAILY (Patient not taking: Reported on  12/04/2022) 30 tablet 2   traMADol (ULTRAM) 50 MG tablet Take 50 mg by mouth every 6 (six) hours as needed for moderate pain or severe pain. (Patient not taking: Reported on 10/09/2022)     No current facility-administered medications for this visit.   Facility-Administered Medications Ordered in Other Visits  Medication Dose Route Frequency Provider Last Rate Last Admin   heparin lock flush 100 UNIT/ML injection            heparin lock flush 100 UNIT/ML injection            heparin lock flush 100 unit/mL  500 Units Intravenous Once Charlaine Dalton R, MD       sodium chloride flush (NS) 0.9 % injection 10 mL  10 mL Intravenous PRN Nathan Sickle, MD   10 mL at 07/28/21 0852   sodium chloride flush (NS)  0.9 % injection 10 mL  10 mL Intravenous Once Charlaine Dalton R, MD          .  PHYSICAL EXAMINATION: ECOG PERFORMANCE STATUS: 1 - Symptomatic but completely ambulatory  Vitals:   12/04/22 0853  BP: (!) 129/90  Pulse: 70  Resp: 18  SpO2: 99%       Filed Weights   12/04/22 0849  Weight: 221 lb (100.2 kg)         Physical Exam HENT:     Head: Normocephalic and atraumatic.     Mouth/Throat:     Pharynx: No oropharyngeal exudate.  Eyes:     Pupils: Pupils are equal, round, and reactive to light.  Cardiovascular:     Rate and Rhythm: Normal rate and regular rhythm.  Pulmonary:     Effort: No respiratory distress.     Breath sounds: No wheezing.     Comments: Decreased breath sound on the left side compared to right. Abdominal:     General: Bowel sounds are normal. There is no distension.     Palpations: Abdomen is soft. There is no mass.     Tenderness: There is no abdominal tenderness. There is no guarding or rebound.  Musculoskeletal:        General: No tenderness. Normal range of motion.     Cervical back: Normal range of motion and neck supple.  Skin:    General: Skin is warm.  Neurological:     Mental Status: He is alert and oriented to  person, place, and time.  Psychiatric:        Mood and Affect: Affect normal.    Fungal dermatitis bilateral buttocks groin/underneath abdominal pannus  LABORATORY DATA:  I have reviewed the data as listed Lab Results  Component Value Date   WBC 7.0 12/04/2022   HGB 14.2 12/04/2022   HCT 43.7 12/04/2022   MCV 97.5 12/04/2022   PLT 183 12/04/2022   Recent Labs    11/13/22 1026 11/20/22 1002 12/04/22 0833  NA 135 136 137  K 3.7 4.1 4.0  CL 104 105 105  CO2 23 23 24   GLUCOSE 114* 117* 94  BUN 14 11 14   CREATININE 0.77 0.72 0.97  CALCIUM 8.3* 8.5* 8.9  GFRNONAA >60 >60 >60  PROT 6.9 7.0 7.6  ALBUMIN 3.6 3.6 3.8  AST 52* 75* 56*  ALT 65* 79* 69*  ALKPHOS 172* 165* 169*  BILITOT 0.2* 0.6 0.8    RADIOGRAPHIC STUDIES: I have personally reviewed the radiological images as listed and agreed with the findings in the report. MR Brain W Wo Contrast  Addendum Date: 11/22/2022   ADDENDUM REPORT: 11/22/2022 13:27 ADDENDUM: These findings were discussed by Dr. Nevada Crane with Dr. Eppie Gibson on 11/22/2022. All suspected new lesions have been annotated with double arrows on series 14. Electronically Signed   By: Emmit Alexanders M.D.   On: 11/22/2022 13:27   Result Date: 11/22/2022 CLINICAL DATA:  Brain metastases, assess treatment response. EXAM: MRI HEAD WITHOUT AND WITH CONTRAST TECHNIQUE: Multiplanar, multiecho pulse sequences of the brain and surrounding structures were obtained without and with intravenous contrast. CONTRAST:  10 mL Vueway COMPARISON:  Brain MRI 08/14/2022. FINDINGS: Brain: Scattered foci of susceptibility throughout both cerebral hemispheres without significant enhancement or vasogenic edema, compatible with treated metastatic disease. Two foci of minimal residual enhancement with decreased surrounding vasogenic edema in the left precentral gyrus (image 22 series 15). Punctate focus of residual enhancement in the inferior right parietal lobe (image 64  series 100). New foci  of pial enhancement along the left postcentral gyrus (image 30 series 15) and right precentral gyrus (image 135 series 15). New 4 mm focus of enhancement in the right occipital lobe (image 87 series 15). New 2 mm foci of enhancement in the left occipital lobe (image 67 series 14) and right anterior temporal lobe (image 66 series 14). New punctate focus of enhancement in the inferior right cerebellar hemisphere (image 148 series 101). New 2 mm foci of enhancement in the inferior left cerebellar hemisphere (image 147 series 101) and right mid cerebellar hemisphere (image 140 series 101). No acute infarct or intracranial hemorrhage. No mass effect or midline shift. No hydrocephalus or extra-axial collection. Vascular: Normal flow voids. Skull and upper cervical spine: Normal marrow signal and enhancement. Sinuses/Orbits: Paranasal sinuses, mastoid air cells, and middle ear cavities are well aerated. Orbits are unremarkable. Other: None. IMPRESSION: 1. Numerous new punctate foci of enhancement in the bilateral cerebral and cerebellar hemispheres, as detailed above and concerning for progressive metastatic disease. 2. Few foci of minimal residual enhancement in the left precentral gyrus and inferior right parietal lobe, with decreased surrounding vasogenic edema. 3. Scattered foci of susceptibility throughout both cerebral hemispheres without significant enhancement or vasogenic edema, compatible with treated metastatic disease. Electronically Signed: By: Emmit Alexanders M.D. On: 11/16/2022 09:22   NM PET Image Restage (PS) Skull Base to Thigh (F-18 FDG)  Result Date: 11/09/2022 CLINICAL DATA:  Subsequent treatment strategy for non-small cell lung cancer. EXAM: NUCLEAR MEDICINE PET SKULL BASE TO THIGH TECHNIQUE: 12.19 mCi F-18 FDG was injected intravenously. Full-ring PET imaging was performed from the skull base to thigh after the radiotracer. CT data was obtained and used for attenuation correction and anatomic  localization. Fasting blood glucose: 103 mg/dl COMPARISON:  PET-CT 08/15/2022.  Abdominal MRI 09/04/2022. FINDINGS: Mediastinal blood pool activity: SUV max 1.0 NECK: No hypermetabolic cervical lymph nodes are identified. No suspicious activity identified within the pharyngeal mucosal space. Incidental CT findings: none CHEST: There are no hypermetabolic mediastinal, hilar or axillary lymph nodes. 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 There is compressive atelectasis of the remaining left lung which appears similar to the previous study. No residual focal hypermetabolic nodules are demonstrated. Incidental CT findings: Unchanged 6 mm right upper lobe nodule on image 88/2, too small to characterize, although likely benign based on stability. No new or enlarging nodules are identified. Previously demonstrated right basilar interstitial prominence has improved. Right IJ Port-A-Cath extends to the superior aspect of the right atrium. ABDOMEN/PELVIS: Previously demonstrated extensive heterogeneous hypermetabolic activity throughout the liver has resolved. No residual abnormal hepatic uptake is seen. There is no abnormal activity within the spleen, pancreas or adrenal glands. Bowel activity appears within physiologic limits. There is no hypermetabolic nodal activity in the abdomen or pelvis. Incidental CT findings: Previously demonstrated ascites has resolved. No peritoneal nodularity. SKELETON: There is no hypermetabolic activity to suggest osseous metastatic disease. Mild diffuse osseous activity attributed to treatment response. Incidental CT findings: none IMPRESSION: 1. Interval response to therapy with resolution of previously demonstrated extensive hypermetabolic activity throughout the liver. No residual abnormal hypermetabolic activity within the chest, abdomen or pelvis. 2. Stable chronic loculated left pleural effusion/fibrothorax with  compressive atelectasis of the remaining left lung. 3. Mild diffuse osseous activity attributed to treatment response. 4. Stable small right upper lobe pulmonary nodule, likely benign based on stability. Electronically Signed   By: Richardean Sale M.D.   On: 11/09/2022 08:53  ASSESSMENT & PLAN:   Cancer of lower lobe of left lung ([place]) # STAGE IV- [JUNE 2022] Left lower lobe lung cancer-adenocarcinoma the lung;  EGFR- 19 del POSITIVE.  While on osimertinib- NOV 1st, 2023- PET scan- shows progressive disease in liver. Liquid Biopsy/Guardant testing-positive for EGFR mutation; otherwise Negative for any novel mutations.  Repeat liver Biopsy positive for adenocarcinoma. NEGATIVE for MET amplification.  S/p second opinion at [place].   # status post 4 cycles of carbo Taxol-Atezo+Bev- JAN 25th- Interval response to therapy with resolution of previously demonstrated extensive hypermetabolic activity throughout the liver. No residual abnormal hypermetabolic activity within the chest, abdomen or pelvis. Stable chronic loculated left pleural effusion/fibrothorax with compressive atelectasis of the remaining left lung.  Mild diffuse osseous activity attributed to treatment response.  # Proceed with maintenance- Atezo + bev every 3 weeks.  Cycle #2 Today. Labs today reviewed;  acceptable for treatment today.   # Nose bleed: sec to Avastin/dry weather- on blowing- recommend Vaseline prn. STABLE.   #Asymptomatic multiple brain mets subcentimeter asymptomatic- Pre-SBRT MRI brain AUG 18th, 2023- Eighteen small enhancing brain metastases (punctate to 11 mm)-s/p SBRT SEP 2023- [[place]].  FEB 12th, 2024- Numerous new punctate foci of enhancement in the bilateral cerebral and cerebellar hemispheres, as detailed above and concerning for progressive metastatic disease. S/p SBRT [[place]]- FEB 12th, 2024.   #Elevated LFTs: AST ALT 100 range; elevated alkaline phosphatase normal bilirubin-likely secondary progressive  disease in the liver. NOV/PET scan 2023- MRI liver- . Diffuse hepatic metastatic disease with numerous lesions throughout both lobes of the liver-- monitor for now- STABLE.   # Elevated BP- sec to Avastin 137/93- monitor or now; at home BP reviewed. 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 On Eliquis-5 mg BID. STABLE  #Bone metastases- CT DEc 7th, 2022 to healing process.  Dental evaluation on 9/21.  Continue calcium vitamin D intake. calcium 8.9- STABLE. Consider Zometa at next visit.   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  Will refill-Stable.  # IV access/ port flush.   # DISPOSITION:  # Atezo+Bev today; do not wait for UA;   # Follow up in 3 weeks- MD; labs- cbc/cmp; CEA; UA;Atezo+Bev; TSH; possible Zometa-. Dr.B             All questions were answered. The patient knows to call the clinic with any problems, questions or concerns.    Nathan Sickle, MD 12/04/2022 9:19 AM

## 2022-12-04 NOTE — Assessment & Plan Note (Addendum)
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  While on osimertinib- NOV 1st, 2023- PET scan- shows progressive disease in liver. Liquid Biopsy/Guardant testing-positive for EGFR mutation; otherwise Negative for any novel mutations.  Repeat liver Biopsy positive for adenocarcinoma. NEGATIVE for MET amplification.  S/p second opinion at [facility].   # status post 4 cycles of carbo Taxol-Atezo+Bev- JAN 25th- Interval response to therapy with resolution of previously demonstrated extensive hypermetabolic activity throughout the liver. No residual abnormal hypermetabolic activity within the chest, abdomen or pelvis. Stable chronic loculated left pleural effusion/fibrothorax with compressive atelectasis of the remaining left lung.  Mild diffuse osseous activity attributed to treatment response.  # Proceed with maintenance- Atezo + bev every 3 weeks.  Cycle #2 Today. Labs today reviewed;  acceptable for treatment today.   # Nose bleed: sec to Avastin/dry weather- on blowing- recommend Vaseline prn. STABLE.   #Asymptomatic multiple brain mets subcentimeter asymptomatic- Pre-SBRT MRI brain AUG 18th, 2023- Eighteen small enhancing brain metastases (punctate to 11 mm)-s/p SBRT SEP 2023- [[place]].  FEB 12th, 2024- Numerous new punctate foci of enhancement in the bilateral cerebral and cerebellar hemispheres, as detailed above and concerning for progressive metastatic disease. S/p SBRT [[place]]- FEB 12th, 2024.   #Elevated LFTs: AST ALT 100 range; elevated alkaline phosphatase normal bilirubin-likely secondary progressive disease in the liver. NOV/PET scan 2023- MRI liver- . Diffuse hepatic metastatic disease with numerous lesions throughout both lobes of the liver-- monitor for now- STABLE.   # Elevated BP- sec to Avastin 137/93- monitor or now; at home BP reviewed. 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 On Eliquis-5 mg BID. STABLE  #Bone metastases- CT DEc 7th, 2022 to healing process.  Dental evaluation on 9/21.  Continue calcium vitamin D intake. calcium 8.9- STABLE. Consider Zometa at next visit.   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  Will refill-Stable.  # IV access/ port flush.   # DISPOSITION:  # Atezo+Bev today; do not wait for UA;   # Follow up in 3 weeks- MD; labs- cbc/cmp; CEA; UA;Atezo+Bev; TSH; possible Zometa-. Dr.B

## 2022-12-04 NOTE — Progress Notes (Signed)
Patient reports a little numbness/tingling in feet that comes and goes, hands are better and nose bleeds.

## 2022-12-04 NOTE — Patient Instructions (Signed)
[name]  Discharge Instructions: Thank you for choosing [facility] to provide your oncology and hematology care.  If you have a lab appointment with the [facility], please go directly to the [facility] and check in at the registration area.  Wear comfortable clothing and clothing appropriate for easy access to any Portacath or PICC line.   We strive to give you quality time with your provider. You may need to reschedule your appointment if you arrive late (15 or more minutes).  Arriving late affects you and other patients whose appointments are after yours.  Also, if you miss three or more appointments without notifying the office, you may be dismissed from the clinic at the provider's discretion.      For prescription refill requests, have your pharmacy contact our office and allow 72 hours for refills to be completed.    Today you received the following chemotherapy and/or immunotherapy agents TECENTRIQ and MVASI      To help prevent nausea and vomiting after your treatment, we encourage you to take your nausea medication as directed.  BELOW ARE SYMPTOMS THAT SHOULD BE REPORTED IMMEDIATELY: *FEVER GREATER THAN 100.4 F (38 C) OR HIGHER *CHILLS OR SWEATING *NAUSEA AND VOMITING THAT IS NOT CONTROLLED WITH YOUR NAUSEA MEDICATION *UNUSUAL SHORTNESS OF BREATH *UNUSUAL BRUISING OR BLEEDING *URINARY PROBLEMS (pain or burning when urinating, or frequent urination) *BOWEL PROBLEMS (unusual diarrhea, constipation, pain near the anus) TENDERNESS IN MOUTH AND THROAT WITH OR WITHOUT PRESENCE OF ULCERS (sore throat, sores in mouth, or a toothache) UNUSUAL RASH, SWELLING OR PAIN  UNUSUAL VAGINAL DISCHARGE OR ITCHING   Items with * indicate a potential emergency and should be followed up as soon as possible or go to the Emergency Department if any problems should occur.  Please show the CHEMOTHERAPY ALERT CARD or IMMUNOTHERAPY ALERT CARD at  check-in to the Emergency Department and triage nurse.  Should you have questions after your visit or need to cancel or reschedule your appointment, please contact [facility]  [phone] and follow the prompts.  Office hours are 8:00 a.m. to 4:30 p.m. Monday - Friday. Please note that voicemails left after 4:00 p.m. may not be returned until the following business day.  We are closed weekends and major holidays. You have access to a nurse at all times for urgent questions. Please call the main number to the clinic [phone] and follow the prompts.  For any non-urgent questions, you may also contact your provider using MyChart. We now offer e-Visits for anyone 53 and older to request care online for non-urgent symptoms. For details visit [email or web address].   Also download the MyChart app! Go to the app store, search "MyChart", open the app, select , and log in with your MyChart username and password.  Atezolizumab Injection What is this medication? ATEZOLIZUMAB (a te zoe LIZ ue mab) treats some types of cancer. It works by helping your immune system slow or stop the spread of cancer cells. It is a monoclonal antibody. This medicine may be used for other purposes; ask your health care provider or pharmacist if you have questions. COMMON BRAND NAME(S): Tecentriq What should I tell my care team before I take this medication? They need to know if you have any of these conditions: Allogeneic stem cell transplant (uses someone else's stem cells) Autoimmune diseases, such as Crohn disease, ulcerative colitis, lupus History of chest radiation Nervous system problems, such as Guillain-Barre  syndrome, myasthenia gravis Organ transplant An unusual or allergic reaction to atezolizumab, other medications, foods, dyes, or preservatives Pregnant or trying to get pregnant Breast-feeding How should I use this medication? This medication is injected into  a vein. It is given by your care team in a hospital or clinic setting. A special MedGuide will be given to you before each treatment. Be sure to read this information carefully each time. Talk to your care team about the use of this medication in children. While it may be prescribed for children as young as 2 years for selected conditions, precautions do apply. Overdosage: If you think you have taken too much of this medicine contact a poison control center or emergency room at once. NOTE: This medicine is only for you. Do not share this medicine with others. What if I miss a dose? Keep appointments for follow-up doses. It is important not to miss your dose. Call your care team if you are unable to keep an appointment. What may interact with this medication? Interactions have not been studied. This list may not describe all possible interactions. Give your health care provider a list of all the medicines, herbs, non-prescription drugs, or dietary supplements you use. Also tell them if you smoke, drink alcohol, or use illegal drugs. Some items may interact with your medicine. What should I watch for while using this medication? Your condition will be monitored carefully while you are receiving this medication. You may need blood work while taking this medication. This medication may cause serious skin reactions. They can happen weeks to months after starting the medication. Contact your care team right away if you notice fevers or flu-like symptoms with a rash. The rash may be red or purple and then turn into blisters or peeling of the skin. You may also notice a red rash with swelling of the face, lips, or lymph nodes in your neck or under your arms. Tell your care team right away if you have any change in your eyesight. Talk to your care team if you may be pregnant. Serious birth defects can occur if you take this medication during pregnancy and for 5 months after the last dose. You will need a  negative pregnancy test before starting this medication. Contraception is recommended while taking this medication and for 5 months after the last dose. Your care team can help you find the option that works for you. Do not breastfeed while taking this medication and for at least 5 months after the last dose. What side effects may I notice from receiving this medication? Side effects that you should report to your doctor or health care professional as soon as possible: Allergic reactions--skin rash, itching, hives, swelling of the face, lips, tongue, or throat Dry cough, shortness of breath or trouble breathing Eye pain, redness, irritation, or discharge with blurry or decreased vision Heart muscle inflammation--unusual weakness or fatigue, shortness of breath, chest pain, fast or irregular heartbeat, dizziness, swelling of the ankles, feet, or hands Hormone gland problems--headache, sensitivity to light, unusual weakness or fatigue, dizziness, fast or irregular heartbeat, increased sensitivity to cold or heat, excessive sweating, constipation, hair loss, increased thirst or amount of urine, tremors or shaking, irritability Infusion reactions--chest pain, shortness of breath or trouble breathing, feeling faint or lightheaded Kidney injury (glomerulonephritis)--decrease in the amount of urine, red or dark brown urine, foamy or bubbly urine, swelling of the ankles, hands, or feet Liver injury--right upper belly pain, loss of appetite, nausea, light-colored stool, dark yellow or  brown urine, yellowing skin or eyes, unusual weakness or fatigue Pain, tingling, or numbness in the hands or feet, muscle weakness, change in vision, confusion or trouble speaking, loss of balance or coordination, trouble walking, seizures Rash, fever, and swollen lymph nodes Redness, blistering, peeling, or loosening of the skin, including inside the mouth Sudden or severe stomach pain, bloody diarrhea, fever, nausea,  vomiting Side effects that usually do not require medical attention (report to your doctor or health care professional if they continue or are bothersome): Bone, joint, or muscle pain Diarrhea Fatigue Loss of appetite Nausea Skin rash This list may not describe all possible side effects. Call your doctor for medical advice about side effects. You may report side effects to FDA at 1-800-FDA-1088. Where should I keep my medication? This medication is given in a hospital or clinic. It will not be stored at home. NOTE: This sheet is a summary. It may not cover all possible information. If you have questions about this medicine, talk to your doctor, pharmacist, or health care provider.  2023 Elsevier/Gold Standard (2022-02-13 00:00:00)  Bevacizumab Injection What is this medication? BEVACIZUMAB (be va SIZ yoo mab) treats some types of cancer. It works by blocking a protein that causes cancer cells to grow and multiply. This helps to slow or stop the spread of cancer cells. It is a monoclonal antibody. This medicine may be used for other purposes; ask your health care provider or pharmacist if you have questions. COMMON BRAND NAME(S): Alymsys, Avastin, MVASI, Noah Charon What should I tell my care team before I take this medication? They need to know if you have any of these conditions: Blood clots Coughing up blood Having or recent surgery Heart failure High blood pressure History of a connection between 2 or more body parts that do not usually connect (fistula) History of a tear in your stomach or intestines Protein in your urine An unusual or allergic reaction to bevacizumab, other medications, foods, dyes, or preservatives Pregnant or trying to get pregnant Breast-feeding How should I use this medication? This medication is injected into a vein. It is given by your care team in a hospital or clinic setting. Talk to your care team the use of this medication in children. Special care may  be needed. Overdosage: If you think you have taken too much of this medicine contact a poison control center or emergency room at once. NOTE: This medicine is only for you. Do not share this medicine with others. What if I miss a dose? Keep appointments for follow-up doses. It is important not to miss your dose. Call your care team if you are unable to keep an appointment. What may interact with this medication? Interactions are not expected. This list may not describe all possible interactions. Give your health care provider a list of all the medicines, herbs, non-prescription drugs, or dietary supplements you use. Also tell them if you smoke, drink alcohol, or use illegal drugs. Some items may interact with your medicine. What should I watch for while using this medication? Your condition will be monitored carefully while you are receiving this medication. You may need blood work while taking this medication. This medication may make you feel generally unwell. This is not uncommon as chemotherapy can affect healthy cells as well as cancer cells. Report any side effects. Continue your course of treatment even though you feel ill unless your care team tells you to stop. This medication may increase your risk to bruise or bleed. Call your care  team if you notice any unusual bleeding. Before having surgery, talk to your care team to make sure it is ok. This medication can increase the risk of poor healing of your surgical site or wound. You will need to stop this medication for 28 days before surgery. After surgery, wait at least 28 days before restarting this medication. Make sure the surgical site or wound is healed enough before restarting this medication. Talk to your care team if questions. Talk to your care team if you may be pregnant. Serious birth defects can occur if you take this medication during pregnancy and for 6 months after the last dose. Contraception is recommended while taking this  medication and for 6 months after the last dose. Your care team can help you find the option that works for you. Do not breastfeed while taking this medication and for 6 months after the last dose. This medication can cause infertility. Talk to your care team if you are concerned about your fertility. What side effects may I notice from receiving this medication? Side effects that you should report to your care team as soon as possible: Allergic reactions--skin rash, itching, hives, swelling of the face, lips, tongue, or throat Bleeding--bloody or black, tar-like stools, vomiting blood or brown material that looks like coffee grounds, red or dark brown urine, small red or purple spots on skin, unusual bruising or bleeding Blood clot--pain, swelling, or warmth in the leg, shortness of breath, chest pain Heart attack--pain or tightness in the chest, shoulders, arms, or jaw, nausea, shortness of breath, cold or clammy skin, feeling faint or lightheaded Heart failure--shortness of breath, swelling of the ankles, feet, or hands, sudden weight gain, unusual weakness or fatigue Increase in blood pressure Infection--fever, chills, cough, sore throat, wounds that don't heal, pain or trouble when passing urine, general feeling of discomfort or being unwell Infusion reactions--chest pain, shortness of breath or trouble breathing, feeling faint or lightheaded Kidney injury--decrease in the amount of urine, swelling of the ankles, hands, or feet Stomach pain that is severe, does not go away, or gets worse Stroke--sudden numbness or weakness of the face, arm, or leg, trouble speaking, confusion, trouble walking, loss of balance or coordination, dizziness, severe headache, change in vision Sudden and severe headache, confusion, change in vision, seizures, which may be signs of posterior reversible encephalopathy syndrome (PRES) Side effects that usually do not require medical attention (report to your care team if  they continue or are bothersome): Back pain Change in taste Diarrhea Dry skin Increased tears Nosebleed This list may not describe all possible side effects. Call your doctor for medical advice about side effects. You may report side effects to FDA at 1-800-FDA-1088. Where should I keep my medication? This medication is given in a hospital or clinic. It will not be stored at home. NOTE: This sheet is a summary. It may not cover all possible information. If you have questions about this medicine, talk to your doctor, pharmacist, or health care provider.  2023 Elsevier/Gold Standard (2022-02-02 00:00:00)

## 2022-12-05 LAB — T4: T4, Total: 6.9 ug/dL (ref 4.5–12.0)

## 2022-12-06 LAB — CEA: CEA: 6.9 ng/mL — ABNORMAL HIGH (ref 0.0–4.7)

## 2022-12-07 ENCOUNTER — Telehealth: Payer: Self-pay

## 2022-12-07 NOTE — Telephone Encounter (Signed)
Eliquis PA request submitted via Cover My Meds (Key: [id number]).

## 2022-12-07 NOTE — Telephone Encounter (Signed)
Status:Approved;Coverage Start Date:12/07/2022;Coverage End Date:12/07/2023;

## 2022-12-12 NOTE — Progress Notes (Signed)
[name] presents today for follow up after completing radiation treatment for brain metastasis. He finished treatment on 11-26-22.   Recent neurologic symptoms, if any:  Seizures: Denies Headaches: Denies Nausea: Denies Wt Readings from Last 3 Encounters:  12/25/22 225 lb 3.2 oz (102.2 kg)  12/25/22 222 lb 9.6 oz (101 kg)  12/04/22 221 lb (100.2 kg)   Dizziness/ataxia: Denies Difficulty with hand coordination: Denies Focal numbness/weakness: Denies (had a brief bout of neuropathy related to systemic treatment, but reports this has resolved) Visual deficits/changes: Does report his vision is progressively declining, but he doesn't feel this is related to his cancer treatment Confusion/Memory deficits: Denies  Other issues of note: Overall reports he's doing well and denies any concerns. Continues to tolerate systemic treatment well

## 2022-12-13 ENCOUNTER — Other Ambulatory Visit: Payer: Self-pay

## 2022-12-13 ENCOUNTER — Encounter: Payer: Self-pay | Admitting: Internal Medicine

## 2022-12-13 ENCOUNTER — Other Ambulatory Visit: Payer: Self-pay | Admitting: Radiation Therapy

## 2022-12-13 DIAGNOSIS — C7931 Secondary malignant neoplasm of brain: Secondary | ICD-10-CM

## 2022-12-14 MED ORDER — APIXABAN 5 MG PO TABS: 5.0000 mg | ORAL_TABLET | Freq: Two times a day (BID) | ORAL | 2 refills | Status: DC

## 2022-12-25 ENCOUNTER — Inpatient Hospital Stay: Payer: Managed Care, Other (non HMO) | Attending: Internal Medicine | Admitting: Internal Medicine

## 2022-12-25 ENCOUNTER — Inpatient Hospital Stay: Payer: Managed Care, Other (non HMO)

## 2022-12-25 ENCOUNTER — Other Ambulatory Visit: Payer: Self-pay | Admitting: Radiation Therapy

## 2022-12-25 ENCOUNTER — Ambulatory Visit
Admission: RE | Admit: 2022-12-25 | Discharge: 2022-12-25 | Disposition: A | Discharge status: Home / Self Care | Payer: Managed Care, Other (non HMO) | Source: Ambulatory Visit | Attending: Radiation Oncology | Admitting: Radiation Oncology

## 2022-12-25 VITALS — BP 130/88 | HR 68

## 2022-12-25 VITALS — BP 121/88 | HR 66 | Temp 97.0°F | Ht 71.0 in | Wt 222.6 lb

## 2022-12-25 VITALS — BP 119/88 | HR 75 | Temp 97.9°F | Resp 20 | Ht 71.0 in | Wt 225.2 lb

## 2022-12-25 DIAGNOSIS — C7931 Secondary malignant neoplasm of brain: Secondary | ICD-10-CM | POA: Insufficient documentation

## 2022-12-25 DIAGNOSIS — C7951 Secondary malignant neoplasm of bone: Secondary | ICD-10-CM | POA: Insufficient documentation

## 2022-12-25 DIAGNOSIS — C3432 Malignant neoplasm of lower lobe, left bronchus or lung: Secondary | ICD-10-CM

## 2022-12-25 DIAGNOSIS — C787 Secondary malignant neoplasm of liver and intrahepatic bile duct: Secondary | ICD-10-CM | POA: Diagnosis not present

## 2022-12-25 DIAGNOSIS — Z79899 Other long term (current) drug therapy: Secondary | ICD-10-CM | POA: Insufficient documentation

## 2022-12-25 DIAGNOSIS — J90 Pleural effusion, not elsewhere classified: Secondary | ICD-10-CM | POA: Diagnosis not present

## 2022-12-25 DIAGNOSIS — Z7901 Long term (current) use of anticoagulants: Secondary | ICD-10-CM | POA: Insufficient documentation

## 2022-12-25 DIAGNOSIS — Z923 Personal history of irradiation: Secondary | ICD-10-CM | POA: Insufficient documentation

## 2022-12-25 DIAGNOSIS — Z86718 Personal history of other venous thrombosis and embolism: Secondary | ICD-10-CM | POA: Insufficient documentation

## 2022-12-25 DIAGNOSIS — R040 Epistaxis: Secondary | ICD-10-CM | POA: Diagnosis not present

## 2022-12-25 DIAGNOSIS — Z5112 Encounter for antineoplastic immunotherapy: Secondary | ICD-10-CM | POA: Insufficient documentation

## 2022-12-25 LAB — CBC WITH DIFFERENTIAL/PLATELET
Abs Immature Granulocytes: 0.04 10*3/uL (ref 0.00–0.07)
Basophils Absolute: 0 10*3/uL (ref 0.0–0.1)
Basophils Relative: 1 %
Eosinophils Absolute: 0.2 10*3/uL (ref 0.0–0.5)
Eosinophils Relative: 3 %
HCT: 45.3 % (ref 39.0–52.0)
Hemoglobin: 15 g/dL (ref 13.0–17.0)
Immature Granulocytes: 1 %
Lymphocytes Relative: 18 %
Lymphs Abs: 1.2 10*3/uL (ref 0.7–4.0)
MCH: 32 pg (ref 26.0–34.0)
MCHC: 33.1 g/dL (ref 30.0–36.0)
MCV: 96.6 fL (ref 80.0–100.0)
Monocytes Absolute: 0.7 10*3/uL (ref 0.1–1.0)
Monocytes Relative: 10 %
Neutro Abs: 4.5 10*3/uL (ref 1.7–7.7)
Neutrophils Relative %: 67 %
Platelets: 164 10*3/uL (ref 150–400)
RBC: 4.69 MIL/uL (ref 4.22–5.81)
RDW: 13.6 % (ref 11.5–15.5)
WBC: 6.7 10*3/uL (ref 4.0–10.5)
nRBC: 0 % (ref 0.0–0.2)

## 2022-12-25 LAB — COMPREHENSIVE METABOLIC PANEL
ALT: 75 U/L — ABNORMAL HIGH (ref 0–44)
AST: 67 U/L — ABNORMAL HIGH (ref 15–41)
Albumin: 3.8 g/dL (ref 3.5–5.0)
Alkaline Phosphatase: 155 U/L — ABNORMAL HIGH (ref 38–126)
Anion gap: 6 (ref 5–15)
BUN: 10 mg/dL (ref 6–20)
CO2: 26 mmol/L (ref 22–32)
Calcium: 8.7 mg/dL — ABNORMAL LOW (ref 8.9–10.3)
Chloride: 105 mmol/L (ref 98–111)
Creatinine, Ser: 0.93 mg/dL (ref 0.61–1.24)
GFR, Estimated: >60 mL/min (ref >60)
Glucose, Bld: 95 mg/dL (ref 70–99)
Potassium: 4 mmol/L (ref 3.5–5.1)
Sodium: 137 mmol/L (ref 135–145)
Total Bilirubin: 0.8 mg/dL (ref 0.3–1.2)
Total Protein: 7.5 g/dL (ref 6.5–8.1)

## 2022-12-25 LAB — URINALYSIS, COMPLETE (UACMP) WITH MICROSCOPIC
Bilirubin Urine: NEGATIVE
Glucose, UA: NEGATIVE mg/dL
Hgb urine dipstick: NEGATIVE
Ketones, ur: NEGATIVE mg/dL
Leukocytes,Ua: NEGATIVE
Nitrite: NEGATIVE
Protein, ur: NEGATIVE mg/dL
Specific Gravity, Urine: 1.017 (ref 1.005–1.030)
pH: 6 (ref 5.0–8.0)

## 2022-12-25 MED ORDER — HEPARIN SOD (PORK) LOCK FLUSH 100 UNIT/ML IV SOLN
500.0000 [IU] | Freq: Once | INTRAVENOUS | Status: AC | PRN
  Administered 2022-12-25: 500 [IU]
  Filled 2022-12-25: qty 5

## 2022-12-25 MED ORDER — SODIUM CHLORIDE 0.9% FLUSH
10.0000 mL | INTRAVENOUS | Status: DC | PRN
  Administered 2022-12-25: 10 mL
  Filled 2022-12-25: qty 10

## 2022-12-25 MED ORDER — SODIUM CHLORIDE 0.9 % IV SOLN
Freq: Once | INTRAVENOUS | Status: AC
  Filled 2022-12-25: qty 250

## 2022-12-25 MED ORDER — SODIUM CHLORIDE 0.9 % IV SOLN
15.0000 mg/kg | Freq: Once | INTRAVENOUS | Status: AC
  Administered 2022-12-25: 1600 mg via INTRAVENOUS
  Filled 2022-12-25: qty 64

## 2022-12-25 MED ORDER — SODIUM CHLORIDE 0.9 % IV SOLN
1200.0000 mg | Freq: Once | INTRAVENOUS | Status: AC
  Administered 2022-12-25: 1200 mg via INTRAVENOUS
  Filled 2022-12-25: qty 20

## 2022-12-25 NOTE — Progress Notes (Signed)
[facility] NOTE  Patient Care Team: Pcp, No as PCP - General [name], RN as Oncology Nurse Navigator [name], MD as Consulting Physician (Internal Medicine)  CHIEF COMPLAINTS/PURPOSE OF CONSULTATION: Lung cancer  #  Oncology History Overview Note  IMPRESSION: 1. Patchy nodular fat stranding throughout the anterior left upper quadrant peritoneal fat, nonspecific, cannot exclude peritoneal carcinomatosis. Dedicated CT abdomen/pelvis with oral and IV contrast recommended for further evaluation. 2. Dense patchy consolidation replacing much of the left lower lung lobe, appearing masslike in the superior segment left lower lobe, with associated bulging of the left major fissure and associated left lower lobe volume loss. Fine nodularity throughout both lungs with an upper lobe predominance. Asymmetric left upper lobe interlobular septal thickening. These findings are indeterminate, with differential including multilobar pneumonia, sarcoidosis or a neoplastic process. The persistence on radiographs back to 01/20/2021 despite antibiotic therapy make sarcoidosis or a neoplastic process more likely. Pulmonology consultation suggested for consideration of bronchoscopic evaluation. 3. Small dependent left pleural effusion. 4. Mild mediastinal lymphadenopathy, nonspecific. 5. Subacute healing lateral right sixth rib fracture.   DIAGNOSIS:  A. LUNG, LEFT LOWER LOBE; ENB-ASSISTED BIOPSY:  - NON-SMALL CELL CARCINOMA, FAVOR ADENOCARCINOMA.  - FOREIGN MATERIAL SUGGESTIVE OF ASPIRATION.   # EGFR MUTATED: 55 del- June 28th, 2022- [name]. [limited]  # Asymptomatic multiple brain mets subcentimeter asymptomatic -JUNE 29th, 2023- Numerous metastatic lesions in the supratentorial brain-increase in number also conspicuity compared to January 2023.  Pre-SBRT MRI brain AUG 18th, 2023-  Eighteen small enhancing brain metastases (punctate to 11 mm); were all  present on 03/28/2021-s/p SBRT SEP 2023- [[place]]  # SEP-OCT-worsening left-sided pleural effusion-status post paracentesis; exudative; Cytology negative- ?  Progressive disease.  # DEC 2023- CARBO-TAXOL-BEV+ATEZO   Cancer of lower lobe of left lung ([place])  03/23/2021 Initial Diagnosis   Cancer of lower lobe of left lung ([place])   03/29/2021 Cancer Staging   Staging form: Lung, AJCC 8th Edition - Clinical: Stage IVB (cT3, cN3, pM1c) - Signed by [name], MD on 03/29/2021   03/30/2021 - 03/30/2021 Chemotherapy   Patient is on Treatment Plan : LUNG NSCLC Pemetrexed ([place]) / Carboplatin q21d x 1 cycles     08/17/2022 -  Chemotherapy   Patient is on Treatment Plan : LUNG Atezolizumab + Bevacizumab + Carboplatin + Paclitaxel q21d Induction x 4 cycles / Atezolizumab + Bevacizumab q21d Maintenance      HISTORY OF PRESENTING ILLNESS: Ambulating independently.  Alone.   [name] 42 y.o.  male patient with stage IV lung cancer adenocarcinoma-brain mets [synchronous] bone mets EGFR mutated progressed  on osimertinib-currently on maintenance Atezo-Bev is here for follow-up.   Patient has a follow up for SRS/ radiation treatment for brain metastasis today in [place]. He finished treatment on 11-26-22.   Patient reports a little numbness/tingling in feet that comes and goes, hands are better.   Intermittent nosebleeds as patient the morning on blowing- not any worse.   Appetite is better.   Abdominal pain improved.  Denies any worsening headaches. Denies any worsening pain.  No worsening skin rash.   Review of Systems  Constitutional:  Positive for malaise/fatigue. Negative for chills, diaphoresis and fever.  HENT:  Negative for nosebleeds and sore throat.   Eyes:  Negative for double vision.  Respiratory:  Positive for cough. Negative for wheezing.   Cardiovascular:  Negative for chest pain, palpitations, orthopnea and leg swelling.  Gastrointestinal:  Positive for heartburn and nausea.  Negative for  abdominal pain, blood in stool, diarrhea and melena.  Genitourinary:  Negative for dysuria, frequency and urgency.  Musculoskeletal:  Negative for back pain and joint pain.  Skin:  Negative for itching.  Neurological:  Negative for tingling and focal weakness.  Endo/Heme/Allergies:  Does not bruise/bleed easily.  Psychiatric/Behavioral:  Negative for depression. The patient is not nervous/anxious and does not have insomnia.      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    SURGICAL HISTORY: Past Surgical History:  Procedure Laterality Date   FRACTURE SURGERY     broke femur when he was 6 years   PORTA CATH INSERTION N/A 03/28/2021   Procedure: PORTA CATH INSERTION;  Surgeon: Katha Cabal, MD;  Location: [facility];  Service: Cardiovascular;  Laterality: N/A;   VIDEO BRONCHOSCOPY WITH ENDOBRONCHIAL NAVIGATION N/A 03/15/2021   Procedure: VIDEO BRONCHOSCOPY WITH ENDOBRONCHIAL NAVIGATION;  Surgeon: Ottie Glazier, MD;  Location: [facility];  Service: Thoracic;  Laterality: N/A;   VIDEO BRONCHOSCOPY WITH ENDOBRONCHIAL ULTRASOUND N/A 03/15/2021   Procedure: VIDEO BRONCHOSCOPY WITH ENDOBRONCHIAL ULTRASOUND;  Surgeon: Ottie Glazier, MD;  Location: [facility];  Service: Thoracic;  Laterality: N/A;    SOCIAL HISTORY: Social History   Socioeconomic History   Marital status: Married    Spouse name: [name]   Number of children: 2   Years of education: Not on file   Highest education level: Not on file  Occupational History   Not on file  Tobacco Use   Smoking status: Never   Smokeless tobacco: Former    Types: Snuff, Chew  Vaping Use   Vaping Use: Never used  Substance and Sexual Activity   Alcohol use: Never   Drug use: Never   Sexual activity: Yes  Other Topics Concern   Not on file  Social History Narrative   [place] with [place]; 2 daughters[12 and 22]; never smoked; rare alcohol. 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   Social Determinants of Health   Financial Resource Strain: Low Risk  (05/10/2022)   Overall Financial Resource Strain (CARDIA)    Difficulty of Paying Living Expenses: Not hard at all  Food Insecurity: No Food Insecurity (05/10/2022)   Hunger Vital Sign    Worried About Running Out of Food in the Last Year: Never true    Ran Out of Food in the Last Year: Never true  Transportation Needs: No Transportation Needs (03/16/2022)   PRAPARE - Hydrologist (Medical): No    Lack of Transportation (Non-Medical): No  Physical Activity: Not on file  Stress: Not on file  Social Connections: Socially Integrated (05/10/2022)   Social Connection and Isolation Panel [NHANES]    Frequency of Communication with Friends and Family: More than three times a week    Frequency of Social Gatherings with Friends and Family: More than three times a week    Attends Religious Services: More than 4 times per year    Active Member of [employer] or Organizations: Yes    Attends Music therapist: More than 4 times per year    Marital Status: Married  Human resources officer Violence: Not on file    FAMILY HISTORY: Family History  Problem Relation Age of Onset   High Cholesterol Mother    Hypertension Father  Lung cancer Father    Skin cancer Father     ALLERGIES:  is allergic to codeine.  MEDICATIONS:  Current Outpatient Medications  Medication Sig Dispense Refill   apixaban (ELIQUIS) 5 MG TABS tablet Take 1 tablet (5 mg total) by mouth 2 (two) times daily. 60 tablet 2   folic acid (FOLVITE) 1 MG tablet TAKE 1 TABLET BY MOUTH EVERY DAY 90 tablet 1   lidocaine-prilocaine (EMLA) cream Apply 1 Application topically as needed. 30 g 0   metoprolol succinate (TOPROL-XL) 50 MG 24 hr tablet Take 50 mg by mouth daily. Take with or immediately following a meal.     calcium carbonate  (TUMS EX) 750 MG chewable tablet Chew 1 tablet by mouth daily. (Patient not taking: Reported on 08/27/2022)     dronabinol (MARINOL) 2.5 MG capsule Take 1 capsule (2.5 mg total) by mouth 2 (two) times daily before lunch and supper. (Patient not taking: Reported on 11/13/2022) 60 capsule 0   gabapentin (NEURONTIN) 100 MG capsule Take 1 capsule (100 mg total) by mouth at bedtime. (Patient not taking: Reported on 11/13/2022) 30 capsule 1   lidocaine-prilocaine (EMLA) cream Apply 1 application topically as needed. (Patient not taking: Reported on 12/04/2022) 30 g 0   loratadine (CLARITIN) 10 MG tablet Take 10 mg by mouth daily. For 5 days after the treatment. (Patient not taking: Reported on 12/04/2022)     omeprazole (PRILOSEC) 20 MG capsule Take 1 capsule (20 mg total) by mouth daily. (Patient not taking: Reported on 11/13/2022) 30 capsule 2   ondansetron (ZOFRAN) 8 MG tablet One pill every 8 hours as needed for nausea/vomitting. (Patient not taking: Reported on 10/09/2022) 40 tablet 1   oxyCODONE (OXY IR/ROXICODONE) 5 MG immediate release tablet Take 1 tablet (5 mg total) by mouth every 4 (four) hours as needed for severe pain. (Patient not taking: Reported on 10/09/2022) 30 tablet 0   pegfilgrastim (NEULASTA) 6 MG/0.6ML injection Inject 6 mg into the skin once. (Patient not taking: Reported on 12/04/2022)     potassium chloride SA (KLOR-CON M) 20 MEQ tablet Take 1 tablet (20 mEq total) by mouth daily. (Patient not taking: Reported on 11/13/2022) 14 tablet 0   prochlorperazine (COMPAZINE) 10 MG tablet Take 1 tablet (10 mg total) by mouth every 6 (six) hours as needed for nausea or vomiting. (Patient not taking: Reported on 10/09/2022) 40 tablet 1   sertraline (ZOLOFT) 50 MG tablet TAKE 1/2 TABLET BY MOUTH DAILY (Patient not taking: Reported on 12/04/2022) 30 tablet 2   traMADol (ULTRAM) 50 MG tablet Take 50 mg by mouth every 6 (six) hours as needed for moderate pain or severe pain. (Patient not taking: Reported  on 10/09/2022)     No current facility-administered medications for this visit.   Facility-Administered Medications Ordered in Other Visits  Medication Dose Route Frequency Provider Last Rate Last Admin   atezolizumab (TECENTRIQ) 1,200 mg in sodium chloride 0.9 % 250 mL chemo infusion  1,200 mg Intravenous Once Charlaine Dalton R, MD       bevacizumab-awwb (MVASI) 1,600 mg in sodium chloride 0.9 % 100 mL chemo infusion  15 mg/kg (Treatment Plan Recorded) Intravenous Once Charlaine Dalton R, MD       heparin lock flush 100 UNIT/ML injection            heparin lock flush 100 UNIT/ML injection            heparin lock flush 100 unit/mL  500 Units Intracatheter Once PRN Charlaine Dalton  R, MD       sodium chloride flush (NS) 0.9 % injection 10 mL  10 mL Intravenous PRN Charlaine Dalton R, MD   10 mL at 07/28/21 0852   sodium chloride flush (NS) 0.9 % injection 10 mL  10 mL Intracatheter PRN [name], MD          .  PHYSICAL EXAMINATION: ECOG PERFORMANCE STATUS: 1 - Symptomatic but completely ambulatory  Vitals:   12/25/22 0909  BP: 121/88  Pulse: 66  Temp: (!) 97 F (36.1 C)  SpO2: 98%       Filed Weights   12/25/22 0909  Weight: 222 lb 9.6 oz (101 kg)         Physical Exam HENT:     Head: Normocephalic and atraumatic.     Mouth/Throat:     Pharynx: No oropharyngeal exudate.  Eyes:     Pupils: Pupils are equal, round, and reactive to light.  Cardiovascular:     Rate and Rhythm: Normal rate and regular rhythm.  Pulmonary:     Effort: No respiratory distress.     Breath sounds: No wheezing.     Comments: Decreased breath sound on the left side compared to right. Abdominal:     General: Bowel sounds are normal. There is no distension.     Palpations: Abdomen is soft. There is no mass.     Tenderness: There is no abdominal tenderness. There is no guarding or rebound.  Musculoskeletal:        General: No tenderness. Normal range of motion.      Cervical back: Normal range of motion and neck supple.  Skin:    General: Skin is warm.  Neurological:     Mental Status: He is alert and oriented to person, place, and time.  Psychiatric:        Mood and Affect: Affect normal.    Fungal dermatitis bilateral buttocks groin/underneath abdominal pannus  LABORATORY DATA:  I have reviewed the data as listed Lab Results  Component Value Date   WBC 6.7 12/25/2022   HGB 15.0 12/25/2022   HCT 45.3 12/25/2022   MCV 96.6 12/25/2022   PLT 164 12/25/2022   Recent Labs    11/20/22 1002 12/04/22 0833 12/25/22 0855  NA 136 137 137  K 4.1 4.0 4.0  CL 105 105 105  CO2 '23 24 26  '$ GLUCOSE 117* 94 95  BUN '11 14 10  '$ CREATININE 0.72 0.97 0.93  CALCIUM 8.5* 8.9 8.7*  GFRNONAA >60 >60 >60  PROT 7.0 7.6 7.5  ALBUMIN 3.6 3.8 3.8  AST 75* 56* 67*  ALT 79* 69* 75*  ALKPHOS 165* 169* 155*  BILITOT 0.6 0.8 0.8    RADIOGRAPHIC STUDIES: I have personally reviewed the radiological images as listed and agreed with the findings in the report. No results found.  ASSESSMENT & PLAN:   Cancer of lower lobe of left lung ([place]) # STAGE IV- [JUNE 2022] Left lower lobe lung cancer-adenocarcinoma the lung;  EGFR- 19 del POSITIVE.  While on osimertinib- NOV 1st, 2023- PET scan- shows progressive disease in liver. Liquid Biopsy/Guardant testing-positive for EGFR mutation; otherwise Negative for any novel mutations.  Repeat liver Biopsy positive for adenocarcinoma. NEGATIVE for MET amplification.  S/p second opinion at [facility].   # status post 4 cycles of carbo Taxol-Atezo+Bev- JAN 25th, 2024- Interval response to therapy with resolution of previously demonstrated extensive hypermetabolic activity throughout the liver. No residual abnormal hypermetabolic activity within the chest,  abdomen or pelvis. Stable chronic loculated left pleural effusion/fibrothorax with compressive atelectasis of the remaining left lung.  Mild diffuse osseous activity  attributed to treatment response.  # Proceed with maintenance- Atezo + bev every 3 weeks.  Cycle #3 Today. Labs today reviewed;  acceptable for treatment today. Will repeat PET/CT scan in end of April, 2024.   # Nose bleed: sec to Avastin/dry weather- on blowing- continue- Vaseline prn. Stable.   #Asymptomatic multiple brain mets subcentimeter asymptomatic- Pre-SBRT MRI brain AUG 18th, 2023- Eighteen small enhancing brain metastases (punctate to 11 mm)-s/p SBRT SEP 2023- [[place]].  FEB 12th, 2024- Numerous new punctate foci of enhancement in the bilateral cerebral and cerebellar hemispheres, as detailed above and concerning for progressive metastatic disease. S/p SBRT [[place]]- FEB 12th, 2024. Awaiting repeat MRI in May, 2024.   #Elevated LFTs: AST ALT 100 range; elevated alkaline phosphatase normal bilirubin-likely secondary progressive disease in the liver. NOV/PET scan 2023- MRI liver- . Diffuse hepatic metastatic disease with numerous lesions throughout both lobes of the liver-- monitor for now- stable.  # Elevated BP- sec to Avastin-  monitor or now- stable.  # bone metastases-Hypocalcemia: mild-  Discussed regarding low calcium. Recommend calcium 1200 plus vitamin D-3 1000-over-the-counter-1 pill a day.   Will also check vitamin D levels.  HOLD zometa today.   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 On Eliquis-5 mg BID. stable.  #Bone metastases- CT DEc 7th, 2022 to healing process.  Dental evaluation on 9/21.  Continue calcium vitamin D intake. calcium 8.9- stable. Consider Zometa at next visit.   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  # IV access/ port flush.   # DISPOSITION:  # Atezo+Bev today; do not wait for UA; HOLD zometa  #  Follow up in 3 weeks- MD; labs- cbc/cmp; CEA; UA;Atezo+Bev; vit D 25-OH; possible Zometa-. Dr.B  All questions were answered. The patient knows to call the clinic with any problems, questions or concerns.    [name], MD 12/25/2022 10:13 AM

## 2022-12-25 NOTE — Progress Notes (Signed)
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   Interval Since Last Radiation:  4 weeks  Narrative:  The patient returns today for routine follow-up.  He completed SRS therapy on 11/26/22.   Symptomatically, denies  seizures, headaches, nausea, new neurologic deficits, visual changes.  He experienced a headache and fatigue following his treatment, but states that subsided the following day. Patient complains of farsightedness that has been present for the past year.  Patient completed his 7th cycle of atezlizumab combined with bevacizumab-awwb on 12/25/22.    Recent neurologic symptoms, if any:  Seizures: Denies Headaches: Denies Nausea: Denies    Wt Readings from Last 3 Encounters:  12/25/22 225 lb 3.2 oz (102.2 kg)  12/25/22 222 lb 9.6 oz (101 kg)  12/04/22 221 lb (100.2 kg)    Dizziness/ataxia: Denies Difficulty with hand coordination: Denies Focal numbness/weakness: Denies (had a brief bout of neuropathy related to systemic treatment, but reports this has resolved) Visual deficits/changes: Does report his vision is progressively declining, but he doesn't feel this is related to his cancer treatment Confusion/Memory deficits: Denies   Other issues of note: Overall reports he's doing well and denies any concerns. Continues to tolerate systemic treatment well  ALLERGIES:  is allergic to codeine.  Meds: Current Outpatient  Medications  Medication Sig Dispense Refill   apixaban (ELIQUIS) 5 MG TABS tablet Take 1 tablet (5 mg total) by mouth 2 (two) times daily. 60 tablet 2   calcium carbonate (TUMS EX) 750 MG chewable tablet Chew 1 tablet by mouth daily. (Patient not taking: Reported on 08/27/2022)     dronabinol (MARINOL) 2.5 MG capsule Take 1 capsule (2.5 mg total) by mouth 2 (two) times daily before lunch and supper. (Patient not taking: Reported on 11/13/2022) 60 capsule 0   folic acid (FOLVITE) 1 MG tablet TAKE 1 TABLET BY MOUTH EVERY DAY 90 tablet 1   gabapentin (NEURONTIN) 100 MG capsule Take 1 capsule (100 mg total) by mouth at bedtime. (Patient not taking: Reported on 11/13/2022) 30 capsule 1   lidocaine-prilocaine (EMLA) cream Apply 1 application topically as needed. (Patient not taking: Reported on 12/04/2022) 30 g 0   lidocaine-prilocaine (EMLA) cream Apply 1 Application topically as needed. 30 g 0   loratadine (CLARITIN) 10 MG tablet Take 10 mg by mouth daily. For 5 days after the treatment. (Patient not taking: Reported on 12/04/2022)     metoprolol succinate (TOPROL-XL) 50 MG 24 hr tablet Take 50 mg by mouth daily. Take with or immediately following a meal.     omeprazole (PRILOSEC) 20 MG capsule Take 1 capsule (20 mg total) by mouth daily. (Patient not taking: Reported on 11/13/2022) 30 capsule 2   ondansetron (ZOFRAN) 8 MG tablet One pill every 8 hours as needed for nausea/vomitting. (Patient not taking: Reported on 10/09/2022) 40 tablet 1   oxyCODONE (OXY IR/ROXICODONE) 5 MG immediate release tablet Take 1 tablet (5 mg total) by mouth every  4 (four) hours as needed for severe pain. (Patient not taking: Reported on 10/09/2022) 30 tablet 0   pegfilgrastim (NEULASTA) 6 MG/0.6ML injection Inject 6 mg into the skin once. (Patient not taking: Reported on 12/04/2022)     potassium chloride SA (KLOR-CON M) 20 MEQ tablet Take 1 tablet (20 mEq total) by mouth daily. (Patient not taking: Reported on 11/13/2022) 14 tablet  0   prochlorperazine (COMPAZINE) 10 MG tablet Take 1 tablet (10 mg total) by mouth every 6 (six) hours as needed for nausea or vomiting. (Patient not taking: Reported on 10/09/2022) 40 tablet 1   sertraline (ZOLOFT) 50 MG tablet TAKE 1/2 TABLET BY MOUTH DAILY (Patient not taking: Reported on 12/04/2022) 30 tablet 2   traMADol (ULTRAM) 50 MG tablet Take 50 mg by mouth every 6 (six) hours as needed for moderate pain or severe pain. (Patient not taking: Reported on 10/09/2022)     No current facility-administered medications for this encounter.   Facility-Administered Medications Ordered in Other Encounters  Medication Dose Route Frequency Provider Last Rate Last Admin   heparin lock flush 100 UNIT/ML injection            heparin lock flush 100 UNIT/ML injection            sodium chloride flush (NS) 0.9 % injection 10 mL  10 mL Intravenous PRN [name], MD   10 mL at 07/28/21 [id number]    Physical Findings: The patient is in no acute distress. Patient is alert and oriented.  height is '5\' 11"'$  (1.803 m) and weight is 225 lb 3.2 oz (102.2 kg). His temperature is 97.9 F (36.6 C). His blood pressure is 119/88 and his pulse is 75. His respiration is 20 and oxygen saturation is 97%. .   General: Alert and oriented, in no acute distress HEENT: Head is normocephalic. Extraocular movements are intact. Oropharynx is clear. Neck: Neck is supple, no palpable cervical or supraclavicular lymphadenopathy. Heart: Regular in rate and rhythm with no murmurs, rubs, or gallops. Chest: Clear to auscultation bilaterally, with no rhonchi, wheezes, or rales. Extremities: No cyanosis or edema. Lymphatics: see Neck Exam Skin: No concerning lesions. Musculoskeletal: symmetric strength and muscle tone throughout. Neurologic: Cranial nerves II through XII are grossly intact. No obvious focalities. Speech is fluent. Coordination is intact. Sensation intact.  Psychiatric: Judgment and insight are intact. Affect is  appropriate  Lab Findings: Lab Results  Component Value Date   WBC 6.7 12/25/2022   HGB 15.0 12/25/2022   HCT 45.3 12/25/2022   MCV 96.6 12/25/2022   PLT 164 12/25/2022    Radiographic Findings: No results found.  Impression/Plan:  42 yo male s/p SRS therapy for brain metastasis from stage IVB lung cancer.  Patient tolerated SRS therapy well and is not experiencing any neurologic symptoms from therapy. We will continue to monitor him closely. Repeat brain MRI is scheduled for 02/28/23. Patient will see me 1-2 days after his MRI for follow-up to review his scan. He knows he can call us with any questions or concerns in the meantime.   Patient encouraged to follow up with his eye doctor in regards to his long term vision changes.   On date of service, in total, I spent 20 minutes on this encounter. Patient was seen in person.  _____________________________________   Leona Singleton, PA   Eppie Gibson, MD

## 2022-12-25 NOTE — Patient Instructions (Signed)
[facility]  Discharge Instructions: Thank you for choosing [facility] to provide your oncology and hematology care.  If you have a lab appointment with the Cancer Center, please go directly to the Cancer Center and check in at the registration area.  Wear comfortable clothing and clothing appropriate for easy access to any Portacath or PICC line.   We strive to give you quality time with your provider. You may need to reschedule your appointment if you arrive late (15 or more minutes).  Arriving late affects you and other patients whose appointments are after yours.  Also, if you miss three or more appointments without notifying the office, you may be dismissed from the clinic at the provider's discretion.      For prescription refill requests, have your pharmacy contact our office and allow 72 hours for refills to be completed.    Today you received the following chemotherapy and/or immunotherapy agents TECENTRIQ and MVASI      To help prevent nausea and vomiting after your treatment, we encourage you to take your nausea medication as directed.  BELOW ARE SYMPTOMS THAT SHOULD BE REPORTED IMMEDIATELY: *FEVER GREATER THAN 100.4 F (38 C) OR HIGHER *CHILLS OR SWEATING *NAUSEA AND VOMITING THAT IS NOT CONTROLLED WITH YOUR NAUSEA MEDICATION *UNUSUAL SHORTNESS OF BREATH *UNUSUAL BRUISING OR BLEEDING *URINARY PROBLEMS (pain or burning when urinating, or frequent urination) *BOWEL PROBLEMS (unusual diarrhea, constipation, pain near the anus) TENDERNESS IN MOUTH AND THROAT WITH OR WITHOUT PRESENCE OF ULCERS (sore throat, sores in mouth, or a toothache) UNUSUAL RASH, SWELLING OR PAIN  UNUSUAL VAGINAL DISCHARGE OR ITCHING   Items with * indicate a potential emergency and should be followed up as soon as possible or go to the Emergency Department if any problems should occur.  Please show the CHEMOTHERAPY ALERT CARD or IMMUNOTHERAPY ALERT CARD at  check-in to the Emergency Department and triage nurse.  Should you have questions after your visit or need to cancel or reschedule your appointment, please contact [facility]  [phone] and follow the prompts.  Office hours are 8:00 a.m. to 4:30 p.m. Monday - Friday. Please note that voicemails left after 4:00 p.m. may not be returned until the following business day.  We are closed weekends and major holidays. You have access to a nurse at all times for urgent questions. Please call the main number to the clinic [phone] and follow the prompts.  For any non-urgent questions, you may also contact your provider using MyChart. We now offer e-Visits for anyone 18 and older to request care online for non-urgent symptoms. For details visit [email or web address].   Also download the MyChart app! Go to the app store, search "MyChart", open the app, select , and log in with your MyChart username and password.  Atezolizumab Injection What is this medication? ATEZOLIZUMAB (a te zoe LIZ ue mab) treats some types of cancer. It works by helping your immune system slow or stop the spread of cancer cells. It is a monoclonal antibody. This medicine may be used for other purposes; ask your health care provider or pharmacist if you have questions. COMMON BRAND NAME(S): Tecentriq What should I tell my care team before I take this medication? They need to know if you have any of these conditions: Allogeneic stem cell transplant (uses someone else's stem cells) Autoimmune diseases, such as Crohn disease, ulcerative colitis, lupus History of chest radiation Nervous system problems, such as Guillain-Barre   syndrome, myasthenia gravis Organ transplant An unusual or allergic reaction to atezolizumab, other medications, foods, dyes, or preservatives Pregnant or trying to get pregnant Breast-feeding How should I use this medication? This medication is injected into  a vein. It is given by your care team in a hospital or clinic setting. A special MedGuide will be given to you before each treatment. Be sure to read this information carefully each time. Talk to your care team about the use of this medication in children. While it may be prescribed for children as young as 2 years for selected conditions, precautions do apply. Overdosage: If you think you have taken too much of this medicine contact a poison control center or emergency room at once. NOTE: This medicine is only for you. Do not share this medicine with others. What if I miss a dose? Keep appointments for follow-up doses. It is important not to miss your dose. Call your care team if you are unable to keep an appointment. What may interact with this medication? Interactions have not been studied. This list may not describe all possible interactions. Give your health care provider a list of all the medicines, herbs, non-prescription drugs, or dietary supplements you use. Also tell them if you smoke, drink alcohol, or use illegal drugs. Some items may interact with your medicine. What should I watch for while using this medication? Your condition will be monitored carefully while you are receiving this medication. You may need blood work while taking this medication. This medication may cause serious skin reactions. They can happen weeks to months after starting the medication. Contact your care team right away if you notice fevers or flu-like symptoms with a rash. The rash may be red or purple and then turn into blisters or peeling of the skin. You may also notice a red rash with swelling of the face, lips, or lymph nodes in your neck or under your arms. Tell your care team right away if you have any change in your eyesight. Talk to your care team if you may be pregnant. Serious birth defects can occur if you take this medication during pregnancy and for 5 months after the last dose. You will need a  negative pregnancy test before starting this medication. Contraception is recommended while taking this medication and for 5 months after the last dose. Your care team can help you find the option that works for you. Do not breastfeed while taking this medication and for at least 5 months after the last dose. What side effects may I notice from receiving this medication? Side effects that you should report to your doctor or health care professional as soon as possible: Allergic reactions--skin rash, itching, hives, swelling of the face, lips, tongue, or throat Dry cough, shortness of breath or trouble breathing Eye pain, redness, irritation, or discharge with blurry or decreased vision Heart muscle inflammation--unusual weakness or fatigue, shortness of breath, chest pain, fast or irregular heartbeat, dizziness, swelling of the ankles, feet, or hands Hormone gland problems--headache, sensitivity to light, unusual weakness or fatigue, dizziness, fast or irregular heartbeat, increased sensitivity to cold or heat, excessive sweating, constipation, hair loss, increased thirst or amount of urine, tremors or shaking, irritability Infusion reactions--chest pain, shortness of breath or trouble breathing, feeling faint or lightheaded Kidney injury (glomerulonephritis)--decrease in the amount of urine, red or dark brown urine, foamy or bubbly urine, swelling of the ankles, hands, or feet Liver injury--right upper belly pain, loss of appetite, nausea, light-colored stool, dark yellow or   brown urine, yellowing skin or eyes, unusual weakness or fatigue Pain, tingling, or numbness in the hands or feet, muscle weakness, change in vision, confusion or trouble speaking, loss of balance or coordination, trouble walking, seizures Rash, fever, and swollen lymph nodes Redness, blistering, peeling, or loosening of the skin, including inside the mouth Sudden or severe stomach pain, bloody diarrhea, fever, nausea,  vomiting Side effects that usually do not require medical attention (report to your doctor or health care professional if they continue or are bothersome): Bone, joint, or muscle pain Diarrhea Fatigue Loss of appetite Nausea Skin rash This list may not describe all possible side effects. Call your doctor for medical advice about side effects. You may report side effects to FDA at 1-800-FDA-1088. Where should I keep my medication? This medication is given in a hospital or clinic. It will not be stored at home. NOTE: This sheet is a summary. It may not cover all possible information. If you have questions about this medicine, talk to your doctor, pharmacist, or health care provider.  2023 Elsevier/Gold Standard (2022-02-13 00:00:00)  Bevacizumab Injection What is this medication? BEVACIZUMAB (be va SIZ yoo mab) treats some types of cancer. It works by blocking a protein that causes cancer cells to grow and multiply. This helps to slow or stop the spread of cancer cells. It is a monoclonal antibody. This medicine may be used for other purposes; ask your health care provider or pharmacist if you have questions. COMMON BRAND NAME(S): Alymsys, Avastin, MVASI, Zirabev What should I tell my care team before I take this medication? They need to know if you have any of these conditions: Blood clots Coughing up blood Having or recent surgery Heart failure High blood pressure History of a connection between 2 or more body parts that do not usually connect (fistula) History of a tear in your stomach or intestines Protein in your urine An unusual or allergic reaction to bevacizumab, other medications, foods, dyes, or preservatives Pregnant or trying to get pregnant Breast-feeding How should I use this medication? This medication is injected into a vein. It is given by your care team in a hospital or clinic setting. Talk to your care team the use of this medication in children. Special care may  be needed. Overdosage: If you think you have taken too much of this medicine contact a poison control center or emergency room at once. NOTE: This medicine is only for you. Do not share this medicine with others. What if I miss a dose? Keep appointments for follow-up doses. It is important not to miss your dose. Call your care team if you are unable to keep an appointment. What may interact with this medication? Interactions are not expected. This list may not describe all possible interactions. Give your health care provider a list of all the medicines, herbs, non-prescription drugs, or dietary supplements you use. Also tell them if you smoke, drink alcohol, or use illegal drugs. Some items may interact with your medicine. What should I watch for while using this medication? Your condition will be monitored carefully while you are receiving this medication. You may need blood work while taking this medication. This medication may make you feel generally unwell. This is not uncommon as chemotherapy can affect healthy cells as well as cancer cells. Report any side effects. Continue your course of treatment even though you feel ill unless your care team tells you to stop. This medication may increase your risk to bruise or bleed. Call your care   team if you notice any unusual bleeding. Before having surgery, talk to your care team to make sure it is ok. This medication can increase the risk of poor healing of your surgical site or wound. You will need to stop this medication for 28 days before surgery. After surgery, wait at least 28 days before restarting this medication. Make sure the surgical site or wound is healed enough before restarting this medication. Talk to your care team if questions. Talk to your care team if you may be pregnant. Serious birth defects can occur if you take this medication during pregnancy and for 6 months after the last dose. Contraception is recommended while taking this  medication and for 6 months after the last dose. Your care team can help you find the option that works for you. Do not breastfeed while taking this medication and for 6 months after the last dose. This medication can cause infertility. Talk to your care team if you are concerned about your fertility. What side effects may I notice from receiving this medication? Side effects that you should report to your care team as soon as possible: Allergic reactions--skin rash, itching, hives, swelling of the face, lips, tongue, or throat Bleeding--bloody or black, tar-like stools, vomiting blood or brown material that looks like coffee grounds, red or dark brown urine, small red or purple spots on skin, unusual bruising or bleeding Blood clot--pain, swelling, or warmth in the leg, shortness of breath, chest pain Heart attack--pain or tightness in the chest, shoulders, arms, or jaw, nausea, shortness of breath, cold or clammy skin, feeling faint or lightheaded Heart failure--shortness of breath, swelling of the ankles, feet, or hands, sudden weight gain, unusual weakness or fatigue Increase in blood pressure Infection--fever, chills, cough, sore throat, wounds that don't heal, pain or trouble when passing urine, general feeling of discomfort or being unwell Infusion reactions--chest pain, shortness of breath or trouble breathing, feeling faint or lightheaded Kidney injury--decrease in the amount of urine, swelling of the ankles, hands, or feet Stomach pain that is severe, does not go away, or gets worse Stroke--sudden numbness or weakness of the face, arm, or leg, trouble speaking, confusion, trouble walking, loss of balance or coordination, dizziness, severe headache, change in vision Sudden and severe headache, confusion, change in vision, seizures, which may be signs of posterior reversible encephalopathy syndrome (PRES) Side effects that usually do not require medical attention (report to your care team if  they continue or are bothersome): Back pain Change in taste Diarrhea Dry skin Increased tears Nosebleed This list may not describe all possible side effects. Call your doctor for medical advice about side effects. You may report side effects to FDA at 1-800-FDA-1088. Where should I keep my medication? This medication is given in a hospital or clinic. It will not be stored at home. NOTE: This sheet is a summary. It may not cover all possible information. If you have questions about this medicine, talk to your doctor, pharmacist, or health care provider.  2023 Elsevier/Gold Standard (2022-02-02 00:00:00)      

## 2022-12-25 NOTE — Assessment & Plan Note (Addendum)
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  While on osimertinib- NOV 1st, 2023- PET scan- shows progressive disease in liver. Liquid Biopsy/Guardant testing-positive for EGFR mutation; otherwise Negative for any novel mutations.  Repeat liver Biopsy positive for adenocarcinoma. NEGATIVE for MET amplification.  S/p second opinion at [facility].   # status post 4 cycles of carbo Taxol-Atezo+Bev- JAN 25th, 2024- Interval response to therapy with resolution of previously demonstrated extensive hypermetabolic activity throughout the liver. No residual abnormal hypermetabolic activity within the chest, abdomen or pelvis. Stable chronic loculated left pleural effusion/fibrothorax with compressive atelectasis of the remaining left lung.  Mild diffuse osseous activity attributed to treatment response.  # Proceed with maintenance- Atezo + bev every 3 weeks.  Cycle #3 Today. Labs today reviewed;  acceptable for treatment today. Will repeat PET/CT scan in end of April, 2024.   # Nose bleed: sec to Avastin/dry weather- on blowing- continue- Vaseline prn. Stable.   #Asymptomatic multiple brain mets subcentimeter asymptomatic- Pre-SBRT MRI brain AUG 18th, 2023- Eighteen small enhancing brain metastases (punctate to 11 mm)-s/p SBRT SEP 2023- [[place]].  FEB 12th, 2024- Numerous new punctate foci of enhancement in the bilateral cerebral and cerebellar hemispheres, as detailed above and concerning for progressive metastatic disease. S/p SBRT [[place]]- FEB 12th, 2024. Awaiting repeat MRI in May, 2024.   #Elevated LFTs: AST ALT 100 range; elevated alkaline phosphatase normal bilirubin-likely secondary progressive disease in the liver. NOV/PET scan 2023- MRI liver- . Diffuse hepatic metastatic disease with numerous lesions throughout both lobes of the liver-- monitor for now- stable.  # Elevated BP- sec to Avastin-  monitor or now- stable.  # bone  metastases-Hypocalcemia: mild-  Discussed regarding low calcium. Recommend calcium 1200 plus vitamin D-3 1000-over-the-counter-1 pill a day.   Will also check vitamin D levels.  HOLD zometa today.   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 On Eliquis-5 mg BID. stable.  #Bone metastases- CT DEc 7th, 2022 to healing process.  Dental evaluation on 9/21.  Continue calcium vitamin D intake. calcium 8.9- stable. Consider Zometa at next visit.   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  # IV access/ port flush.   # DISPOSITION:  # Atezo+Bev today; do not wait for UA; HOLD zometa  # Follow up in 3 weeks- MD; labs- cbc/cmp; CEA; UA;Atezo+Bev; vit D 25-OH; possible Zometa-. Dr.B

## 2022-12-26 ENCOUNTER — Telehealth: Payer: Self-pay | Admitting: Radiation Therapy

## 2022-12-26 ENCOUNTER — Encounter: Payer: Self-pay | Admitting: Radiation Oncology

## 2022-12-26 LAB — CEA: CEA: 8.6 ng/mL — ABNORMAL HIGH (ref 0.0–4.7)

## 2022-12-26 NOTE — Telephone Encounter (Signed)
Left a voicemail for patient informing him of the brain MRI and follow-up appointments scheduled for him in May. My contact information was included in case he has questions or a conflict.  Coun[employer] R.T.(R)(T)

## 2023-01-14 ENCOUNTER — Encounter: Payer: Self-pay | Admitting: Internal Medicine

## 2023-01-14 ENCOUNTER — Encounter: Payer: Self-pay | Admitting: Nurse Practitioner

## 2023-01-15 ENCOUNTER — Encounter: Payer: Self-pay | Admitting: Internal Medicine

## 2023-01-15 ENCOUNTER — Inpatient Hospital Stay: Payer: Managed Care, Other (non HMO) | Attending: Internal Medicine | Admitting: Internal Medicine

## 2023-01-15 ENCOUNTER — Inpatient Hospital Stay: Payer: Managed Care, Other (non HMO)

## 2023-01-15 VITALS — BP 130/90 | HR 69 | Temp 96.7°F | Resp 16 | Wt 226.0 lb

## 2023-01-15 VITALS — BP 138/93 | HR 69

## 2023-01-15 DIAGNOSIS — C3432 Malignant neoplasm of lower lobe, left bronchus or lung: Secondary | ICD-10-CM

## 2023-01-15 DIAGNOSIS — R748 Abnormal levels of other serum enzymes: Secondary | ICD-10-CM | POA: Insufficient documentation

## 2023-01-15 DIAGNOSIS — R238 Other skin changes: Secondary | ICD-10-CM

## 2023-01-15 DIAGNOSIS — J90 Pleural effusion, not elsewhere classified: Secondary | ICD-10-CM | POA: Insufficient documentation

## 2023-01-15 DIAGNOSIS — Z5112 Encounter for antineoplastic immunotherapy: Secondary | ICD-10-CM | POA: Insufficient documentation

## 2023-01-15 DIAGNOSIS — C7931 Secondary malignant neoplasm of brain: Secondary | ICD-10-CM | POA: Insufficient documentation

## 2023-01-15 DIAGNOSIS — C787 Secondary malignant neoplasm of liver and intrahepatic bile duct: Secondary | ICD-10-CM | POA: Insufficient documentation

## 2023-01-15 DIAGNOSIS — R030 Elevated blood-pressure reading, without diagnosis of hypertension: Secondary | ICD-10-CM | POA: Diagnosis not present

## 2023-01-15 DIAGNOSIS — Z86718 Personal history of other venous thrombosis and embolism: Secondary | ICD-10-CM | POA: Diagnosis not present

## 2023-01-15 DIAGNOSIS — Z452 Encounter for adjustment and management of vascular access device: Secondary | ICD-10-CM | POA: Diagnosis not present

## 2023-01-15 DIAGNOSIS — C7951 Secondary malignant neoplasm of bone: Secondary | ICD-10-CM | POA: Insufficient documentation

## 2023-01-15 DIAGNOSIS — Z7901 Long term (current) use of anticoagulants: Secondary | ICD-10-CM | POA: Diagnosis not present

## 2023-01-15 DIAGNOSIS — Z79620 Long term (current) use of immunosuppressive biologic: Secondary | ICD-10-CM | POA: Insufficient documentation

## 2023-01-15 LAB — CBC WITH DIFFERENTIAL/PLATELET
Abs Immature Granulocytes: 0.03 10*3/uL (ref 0.00–0.07)
Basophils Absolute: 0 10*3/uL (ref 0.0–0.1)
Basophils Relative: 1 %
Eosinophils Absolute: 0.2 10*3/uL (ref 0.0–0.5)
Eosinophils Relative: 3 %
HCT: 43.4 % (ref 39.0–52.0)
Hemoglobin: 14.2 g/dL (ref 13.0–17.0)
Immature Granulocytes: 1 %
Lymphocytes Relative: 22 %
Lymphs Abs: 1.3 10*3/uL (ref 0.7–4.0)
MCH: 31.4 pg (ref 26.0–34.0)
MCHC: 32.7 g/dL (ref 30.0–36.0)
MCV: 96 fL (ref 80.0–100.0)
Monocytes Absolute: 0.7 10*3/uL (ref 0.1–1.0)
Monocytes Relative: 12 %
Neutro Abs: 3.6 10*3/uL (ref 1.7–7.7)
Neutrophils Relative %: 61 %
Platelets: 152 10*3/uL (ref 150–400)
RBC: 4.52 MIL/uL (ref 4.22–5.81)
RDW: 12.8 % (ref 11.5–15.5)
WBC: 5.7 10*3/uL (ref 4.0–10.5)
nRBC: 0 % (ref 0.0–0.2)

## 2023-01-15 LAB — URINALYSIS, DIPSTICK ONLY
Bilirubin Urine: NEGATIVE
Glucose, UA: NEGATIVE mg/dL
Hgb urine dipstick: NEGATIVE
Ketones, ur: NEGATIVE mg/dL
Leukocytes,Ua: NEGATIVE
Nitrite: NEGATIVE
Protein, ur: NEGATIVE mg/dL
Specific Gravity, Urine: 1.018 (ref 1.005–1.030)
pH: 6 (ref 5.0–8.0)

## 2023-01-15 LAB — COMPREHENSIVE METABOLIC PANEL
ALT: 64 U/L — ABNORMAL HIGH (ref 0–44)
AST: 58 U/L — ABNORMAL HIGH (ref 15–41)
Albumin: 3.7 g/dL (ref 3.5–5.0)
Alkaline Phosphatase: 138 U/L — ABNORMAL HIGH (ref 38–126)
Anion gap: 5 (ref 5–15)
BUN: 7 mg/dL (ref 6–20)
CO2: 24 mmol/L (ref 22–32)
Calcium: 8.2 mg/dL — ABNORMAL LOW (ref 8.9–10.3)
Chloride: 107 mmol/L (ref 98–111)
Creatinine, Ser: 0.9 mg/dL (ref 0.61–1.24)
GFR, Estimated: >60 mL/min (ref >60)
Glucose, Bld: 112 mg/dL — ABNORMAL HIGH (ref 70–99)
Potassium: 3.6 mmol/L (ref 3.5–5.1)
Sodium: 136 mmol/L (ref 135–145)
Total Bilirubin: 0.5 mg/dL (ref 0.3–1.2)
Total Protein: 7 g/dL (ref 6.5–8.1)

## 2023-01-15 MED ORDER — SODIUM CHLORIDE 0.9 % IV SOLN
Freq: Once | INTRAVENOUS | Status: AC
  Filled 2023-01-15: qty 250

## 2023-01-15 MED ORDER — HEPARIN SOD (PORK) LOCK FLUSH 100 UNIT/ML IV SOLN
500.0000 [IU] | Freq: Once | INTRAVENOUS | Status: AC | PRN
  Administered 2023-01-15: 500 [IU]
  Filled 2023-01-15: qty 5

## 2023-01-15 MED ORDER — SODIUM CHLORIDE 0.9 % IV SOLN
15.0000 mg/kg | Freq: Once | INTRAVENOUS | Status: AC
  Administered 2023-01-15: 1600 mg via INTRAVENOUS
  Filled 2023-01-15: qty 64

## 2023-01-15 MED ORDER — SODIUM CHLORIDE 0.9 % IV SOLN
1200.0000 mg | Freq: Once | INTRAVENOUS | Status: AC
  Administered 2023-01-15: 1200 mg via INTRAVENOUS
  Filled 2023-01-15: qty 20

## 2023-01-15 NOTE — Progress Notes (Signed)
[facility] NOTE  Patient Care Team: Pcp, No as PCP - General [name], RN as Oncology Nurse Navigator [name], MD as Consulting Physician (Internal Medicine)  CHIEF COMPLAINTS/PURPOSE OF CONSULTATION: Lung cancer  #  Oncology History Overview Note  IMPRESSION: 1. Patchy nodular fat stranding throughout the anterior left upper quadrant peritoneal fat, nonspecific, cannot exclude peritoneal carcinomatosis. Dedicated CT abdomen/pelvis with oral and IV contrast recommended for further evaluation. 2. Dense patchy consolidation replacing much of the left lower lung lobe, appearing masslike in the superior segment left lower lobe, with associated bulging of the left major fissure and associated left lower lobe volume loss. Fine nodularity throughout both lungs with an upper lobe predominance. Asymmetric left upper lobe interlobular septal thickening. These findings are indeterminate, with differential including multilobar pneumonia, sarcoidosis or a neoplastic process. The persistence on radiographs back to 01/20/2021 despite antibiotic therapy make sarcoidosis or a neoplastic process more likely. Pulmonology consultation suggested for consideration of bronchoscopic evaluation. 3. Small dependent left pleural effusion. 4. Mild mediastinal lymphadenopathy, nonspecific. 5. Subacute healing lateral right sixth rib fracture.   DIAGNOSIS:  A. LUNG, LEFT LOWER LOBE; ENB-ASSISTED BIOPSY:  - NON-SMALL CELL CARCINOMA, FAVOR ADENOCARCINOMA.  - FOREIGN MATERIAL SUGGESTIVE OF ASPIRATION.   # EGFR MUTATED: 59 del- June 28th, 2022- [name]. [limited]  # Asymptomatic multiple brain mets subcentimeter asymptomatic -JUNE 29th, 2023- Numerous metastatic lesions in the supratentorial brain-increase in number also conspicuity compared to January 2023.  Pre-SBRT MRI brain AUG 18th, 2023-  Eighteen small enhancing brain metastases (punctate to 11 mm); were all  present on 03/28/2021-s/p SBRT SEP 2023- [[place]]  # SEP-OCT-worsening left-sided pleural effusion-status post paracentesis; exudative; Cytology negative- ?  Progressive disease.  # DEC 2023- CARBO-TAXOL-BEV+ATEZO   Cancer of lower lobe of left lung  03/23/2021 Initial Diagnosis   Cancer of lower lobe of left lung ([place])   03/29/2021 Cancer Staging   Staging form: Lung, AJCC 8th Edition - Clinical: Stage IVB (cT3, cN3, pM1c) - Signed by [name], MD on 03/29/2021   03/30/2021 - 03/30/2021 Chemotherapy   Patient is on Treatment Plan : LUNG NSCLC Pemetrexed ([place]) / Carboplatin q21d x 1 cycles     08/17/2022 -  Chemotherapy   Patient is on Treatment Plan : LUNG Atezolizumab + Bevacizumab + Carboplatin + Paclitaxel q21d Induction x 4 cycles / Atezolizumab + Bevacizumab q21d Maintenance      HISTORY OF PRESENTING ILLNESS: Ambulating independently.  Alone.   [name] 42 y.o.  male patient with stage IV lung cancer adenocarcinoma-brain mets [synchronous] bone mets EGFR mutated -currently on maintenance Atezo-Bev is here for follow-up.   Patient has a follow up for SRS/ radiation treatment for brain metastasis today in [place]. He finished treatment on 11-26-22.   Pt in for follow up, reports doing well.   Pt is concerned about a spot on his left side of face. Pt reports has had area frozen at dermatologist before.   Intermittent nosebleeds as patient the morning on blowing- not any worse.   Appetite is better.   Abdominal pain improved.  Denies any worsening headaches. Denies any worsening pain.  No worsening skin rash.   Review of Systems  Constitutional:  Positive for malaise/fatigue. Negative for chills, diaphoresis and fever.  HENT:  Negative for nosebleeds and sore throat.   Eyes:  Negative for double vision.  Respiratory:  Positive for cough. Negative for wheezing.   Cardiovascular:  Negative for chest pain, palpitations, orthopnea and  leg swelling.  Gastrointestinal:   Positive for heartburn and nausea. Negative for abdominal pain, blood in stool, diarrhea and melena.  Genitourinary:  Negative for dysuria, frequency and urgency.  Musculoskeletal:  Negative for back pain and joint pain.  Skin:  Negative for itching.  Neurological:  Negative for tingling and focal weakness.  Endo/Heme/Allergies:  Does not bruise/bleed easily.  Psychiatric/Behavioral:  Negative for depression. The patient is not nervous/anxious and does not have insomnia.      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    SURGICAL HISTORY: Past Surgical History:  Procedure Laterality Date   FRACTURE SURGERY     broke femur when he was 6 years   PORTA CATH INSERTION N/A 03/28/2021   Procedure: PORTA CATH INSERTION;  Surgeon: Katha Cabal, MD;  Location: [facility];  Service: Cardiovascular;  Laterality: N/A;   VIDEO BRONCHOSCOPY WITH ENDOBRONCHIAL NAVIGATION N/A 03/15/2021   Procedure: VIDEO BRONCHOSCOPY WITH ENDOBRONCHIAL NAVIGATION;  Surgeon: Ottie Glazier, MD;  Location: [facility];  Service: Thoracic;  Laterality: N/A;   VIDEO BRONCHOSCOPY WITH ENDOBRONCHIAL ULTRASOUND N/A 03/15/2021   Procedure: VIDEO BRONCHOSCOPY WITH ENDOBRONCHIAL ULTRASOUND;  Surgeon: Ottie Glazier, MD;  Location: [facility];  Service: Thoracic;  Laterality: N/A;    SOCIAL HISTORY: Social History   Socioeconomic History   Marital status: Married    Spouse name: [name]   Number of children: 2   Years of education: Not on file   Highest education level: Not on file  Occupational History   Not on file  Tobacco Use   Smoking status: Never   Smokeless tobacco: Former    Types: Snuff, Chew  Vaping Use   Vaping Use: Never used  Substance and Sexual Activity   Alcohol use: Never   Drug use: Never   Sexual activity: Yes  Other Topics Concern   Not on file  Social History  Narrative   [place]ives in [place]; with wife; 2 daughters[12 and 22]; never smoked; rare alcohol. 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   Social Determinants of Health   Financial Resource Strain: Low Risk  (05/10/2022)   Overall Financial Resource Strain (CARDIA)    Difficulty of Paying Living Expenses: Not hard at all  Food Insecurity: No Food Insecurity (05/10/2022)   Hunger Vital Sign    Worried About Running Out of Food in the Last Year: Never true    Ran Out of Food in the Last Year: Never true  Transportation Needs: No Transportation Needs (03/16/2022)   PRAPARE - Hydrologist (Medical): No    Lack of Transportation (Non-Medical): No  Physical Activity: Not on file  Stress: Not on file  Social Connections: Socially Integrated (05/10/2022)   Social Connection and Isolation Panel [NHANES]    Frequency of Communication with Friends and Family: More than three times a week    Frequency of Social Gatherings with Friends and Family: More than three times a week    Attends Religious Services: More than 4 times per year    Active Member of [employer] or Organizations: Yes    Attends Music therapist: More than 4 times per year    Marital Status: Married  Human resources officer Violence: Not on file    FAMILY HISTORY: Family History  Problem Relation Age of Onset  High Cholesterol Mother    Hypertension Father    Lung cancer Father    Skin cancer Father     ALLERGIES:  is allergic to codeine.  MEDICATIONS:  Current Outpatient Medications  Medication Sig Dispense Refill   apixaban (ELIQUIS) 5 MG TABS tablet Take 1 tablet (5 mg total) by mouth 2 (two) times daily. 60 tablet 2   folic acid (FOLVITE) 1 MG tablet TAKE 1 TABLET BY MOUTH EVERY DAY 90 tablet 1   lidocaine-prilocaine (EMLA) cream Apply 1 Application topically as needed. 30 g 0   metoprolol succinate (TOPROL-XL) 50 MG 24 hr tablet Take 50 mg by mouth daily. Take with or immediately following a meal.      calcium carbonate (TUMS EX) 750 MG chewable tablet Chew 1 tablet by mouth daily.     gabapentin (NEURONTIN) 100 MG capsule Take 1 capsule (100 mg total) by mouth at bedtime. (Patient not taking: Reported on 11/13/2022) 30 capsule 1   omeprazole (PRILOSEC) 20 MG capsule Take 1 capsule (20 mg total) by mouth daily. (Patient not taking: Reported on 11/13/2022) 30 capsule 2   No current facility-administered medications for this visit.   Facility-Administered Medications Ordered in Other Visits  Medication Dose Route Frequency Provider Last Rate Last Admin   atezolizumab (TECENTRIQ) 1,200 mg in sodium chloride 0.9 % 250 mL chemo infusion  1,200 mg Intravenous Once Charlaine Dalton R, MD       bevacizumab-awwb (MVASI) 1,600 mg in sodium chloride 0.9 % 100 mL chemo infusion  15 mg/kg (Treatment Plan Recorded) Intravenous Once Charlaine Dalton R, MD       heparin lock flush 100 UNIT/ML injection            heparin lock flush 100 UNIT/ML injection            heparin lock flush 100 unit/mL  500 Units Intracatheter Once PRN [name], MD       sodium chloride flush (NS) 0.9 % injection 10 mL  10 mL Intravenous PRN [name], MD   10 mL at 07/28/21 0852      .  PHYSICAL EXAMINATION: ECOG PERFORMANCE STATUS: 1 - Symptomatic but completely ambulatory  Vitals:   01/15/23 1512  BP: (!) 130/90  Pulse: 69  Resp: 16  Temp: (!) 96.7 F (35.9 C)     Filed Weights   01/15/23 1512  Weight: 226 lb (102.5 kg)    Left cheek papular lesion noted.  No signs of infection.  Physical Exam HENT:     Head: Normocephalic and atraumatic.     Mouth/Throat:     Pharynx: No oropharyngeal exudate.  Eyes:     Pupils: Pupils are equal, round, and reactive to light.  Cardiovascular:     Rate and Rhythm: Normal rate and regular rhythm.  Pulmonary:     Effort: No respiratory distress.     Breath sounds: No wheezing.     Comments: Decreased breath sound on the left side compared  to right. Abdominal:     General: Bowel sounds are normal. There is no distension.     Palpations: Abdomen is soft. There is no mass.     Tenderness: There is no abdominal tenderness. There is no guarding or rebound.  Musculoskeletal:        General: No tenderness. Normal range of motion.     Cervical back: Normal range of motion and neck supple.  Skin:    General: Skin is warm.  Neurological:  Mental Status: He is alert and oriented to person, place, and time.  Psychiatric:        Mood and Affect: Affect normal.    Fungal dermatitis bilateral buttocks groin/underneath abdominal pannus  LABORATORY DATA:  I have reviewed the data as listed Lab Results  Component Value Date   WBC 5.7 01/15/2023   HGB 14.2 01/15/2023   HCT 43.4 01/15/2023   MCV 96.0 01/15/2023   PLT 152 01/15/2023   Recent Labs    12/04/22 0833 12/25/22 0855 01/15/23 1414  NA 137 137 136  K 4.0 4.0 3.6  CL 105 105 107  CO2 24 26 24   GLUCOSE 94 95 112*  BUN 14 10 7   CREATININE 0.97 0.93 0.90  CALCIUM 8.9 8.7* 8.2*  GFRNONAA >60 >60 >60  PROT 7.6 7.5 7.0  ALBUMIN 3.8 3.8 3.7  AST 56* 67* 58*  ALT 69* 75* 64*  ALKPHOS 169* 155* 138*  BILITOT 0.8 0.8 0.5    RADIOGRAPHIC STUDIES: I have personally reviewed the radiological images as listed and agreed with the findings in the report. No results found.  ASSESSMENT & PLAN:   Cancer of lower lobe of left lung ([place]) # STAGE IV- [JUNE 2022] Left lower lobe lung cancer-adenocarcinoma the lung;  EGFR- 19 del POSITIVE.  While on osimertinib- NOV 1st, 2023- PET scan- shows progressive disease in liver. Liquid Biopsy/Guardant testing-positive for EGFR mutation; otherwise Negative for any novel mutations.  Repeat liver Biopsy positive for adenocarcinoma. NEGATIVE for MET amplification.  S/p second opinion at [facility].   # status post 4 cycles of carbo Taxol-Atezo+Bev- JAN 25th, 2024- Interval response to therapy with resolution of previously  demonstrated extensive hypermetabolic activity throughout the liver. No residual abnormal hypermetabolic activity within the chest, abdomen or pelvis. Stable chronic loculated left pleural effusion/fibrothorax with compressive atelectasis of the remaining left lung.  Mild diffuse osseous activity attributed to treatment response.  # Proceed with maintenance- Atezo + bev every 3 weeks.  Cycle #4. Today. Labs today reviewed;  acceptable for treatment today. Will repeat PET/CT scan  today; ordered. p2p  # Nose bleed: sec to Avastin/dry weather- on blowing- continue- Vaseline prn. Stable.   #Asymptomatic multiple brain mets subcentimeter asymptomatic- Pre-SBRT MRI brain AUG 18th, 2023- Eighteen small enhancing brain metastases (punctate to 11 mm)-s/p SBRT SEP 2023- [[place]].  FEB 12th, 2024- Numerous new punctate foci of enhancement in the bilateral cerebral and cerebellar hemispheres, as detailed above and concerning for progressive metastatic disease. S/p SBRT [[place]]- FEB 12th, 2024. Awaiting repeat MRI in May, 16th-2024. Stable.   #Elevated LFTs: AST ALT 50-60s range; elevated alkaline phosphatase normal bilirubin-likely secondary progressive disease in the liver. NOV/PET scan 2023- MRI liver- . Diffuse hepatic metastatic disease with numerous lesions throughout both lobes of the liver-- monitor for now- stable.  Await repeat PET scan.  # Elevated BP- sec to Avastin-  monitor or now- stable  # bone metastases-Hypocalcemia: mild- continue calcium 1200 plus vitamin D-3 1000-over-the-counter-1 pill a day. Stable; vit D 25-OH-pending.   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 On Eliquis-5 mg BID. stable.  #Bone metastases- CT DEc 7th, 2022 to healing process.  Dental evaluation on 9/21.  Continue calcium vitamin D  intake. calcium 8.4- stable.   # Cardiac arrhythmia-SVT s/p adenosine post port [June 2022]; Hx of WPW-stable on  metoprolol-stable.  # left cheek papule-evaluation with dermatology.  Referral made.  # IV access/ port flush.   # DISPOSITION:  # referral to [facility] dermatology re: left cheek papule.   # Atezo+Bev today; do not wait for UA; HOLD zometa  # Follow up in 3 weeks- MD; labs- cbc/cmp; CEA; UA;Atezo+Bev;  possible Zometa; PET scan prior-. Dr.B   All questions were answered. The patient knows to call the clinic with any problems, questions or concerns.    [name], MD 01/15/2023 4:01 PM

## 2023-01-15 NOTE — Patient Instructions (Signed)
[facility]  Discharge Instructions: Thank you for choosing [facility] to provide your oncology and hematology care.  If you have a lab appointment with the [facility], please go directly to the [facility] and check in at the registration area.  Wear comfortable clothing and clothing appropriate for easy access to any Portacath or PICC line.   We strive to give you quality time with your provider. You may need to reschedule your appointment if you arrive late (15 or more minutes).  Arriving late affects you and other patients whose appointments are after yours.  Also, if you miss three or more appointments without notifying the office, you may be dismissed from the clinic at the provider's discretion.      For prescription refill requests, have your pharmacy contact our office and allow 72 hours for refills to be completed.    Today you received the following chemotherapy and/or immunotherapy agents Mvasi and Tecentriq.      To help prevent nausea and vomiting after your treatment, we encourage you to take your nausea medication as directed.  BELOW ARE SYMPTOMS THAT SHOULD BE REPORTED IMMEDIATELY: *FEVER GREATER THAN 100.4 F (38 C) OR HIGHER *CHILLS OR SWEATING *NAUSEA AND VOMITING THAT IS NOT CONTROLLED WITH YOUR NAUSEA MEDICATION *UNUSUAL SHORTNESS OF BREATH *UNUSUAL BRUISING OR BLEEDING *URINARY PROBLEMS (pain or burning when urinating, or frequent urination) *BOWEL PROBLEMS (unusual diarrhea, constipation, pain near the anus) TENDERNESS IN MOUTH AND THROAT WITH OR WITHOUT PRESENCE OF ULCERS (sore throat, sores in mouth, or a toothache) UNUSUAL RASH, SWELLING OR PAIN  UNUSUAL VAGINAL DISCHARGE OR ITCHING   Items with * indicate a potential emergency and should be followed up as soon as possible or go to the Emergency Department if any problems should occur.  Please show the CHEMOTHERAPY ALERT CARD or IMMUNOTHERAPY ALERT CARD at  check-in to the Emergency Department and triage nurse.  Should you have questions after your visit or need to cancel or reschedule your appointment, please contact [facility]  [phone] and follow the prompts.  Office hours are 8:00 a.m. to 4:30 p.m. Monday - Friday. Please note that voicemails left after 4:00 p.m. may not be returned until the following business day.  We are closed weekends and major holidays. You have access to a nurse at all times for urgent questions. Please call the main number to the clinic [phone] and follow the prompts.  For any non-urgent questions, you may also contact your provider using MyChart. We now offer e-Visits for anyone 65 and older to request care online for non-urgent symptoms. For details visit [email or web address].   Also download the MyChart app! Go to the app store, search "MyChart", open the app, select [facility], and log in with your MyChart username and password.

## 2023-01-15 NOTE — Progress Notes (Signed)
Pt in for follow up, reports doing well.  Pt is concerned about a spot on his left side of face.  Pt reports has had area frozen at dermatologist before but pt needs new dermatology referral if possible.

## 2023-01-15 NOTE — Assessment & Plan Note (Addendum)
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  While on osimertinib- NOV 1st, 2023- PET scan- shows progressive disease in liver. Liquid Biopsy/Guardant testing-positive for EGFR mutation; otherwise Negative for any novel mutations.  Repeat liver Biopsy positive for adenocarcinoma. NEGATIVE for MET amplification.  S/p second opinion at [facility].   # status post 4 cycles of carbo Taxol-Atezo+Bev- JAN 25th, 2024- Interval response to therapy with resolution of previously demonstrated extensive hypermetabolic activity throughout the liver. No residual abnormal hypermetabolic activity within the chest, abdomen or pelvis. Stable chronic loculated left pleural effusion/fibrothorax with compressive atelectasis of the remaining left lung.  Mild diffuse osseous activity attributed to treatment response.  # Proceed with maintenance- Atezo + bev every 3 weeks.  Cycle #4. Today. Labs today reviewed;  acceptable for treatment today. Will repeat PET/CT scan  today; ordered. p2p  # Nose bleed: sec to Avastin/dry weather- on blowing- continue- Vaseline prn. Stable.   #Asymptomatic multiple brain mets subcentimeter asymptomatic- Pre-SBRT MRI brain AUG 18th, 2023- Eighteen small enhancing brain metastases (punctate to 11 mm)-s/p SBRT SEP 2023- [[place]  FEB 12th, 2024- Numerous new punctate foci of enhancement in the bilateral cerebral and cerebellar hemispheres, as detailed above and concerning for progressive metastatic disease. S/p SBRT [[place]]- FEB 12th, 2024. Awaiting repeat MRI in May, 16th-2024. Stable.   #Elevated LFTs: AST ALT 50-60s range; elevated alkaline phosphatase normal bilirubin-likely secondary progressive disease in the liver. NOV/PET scan 2023- MRI liver- . Diffuse hepatic metastatic disease with numerous lesions throughout both lobes of the liver-- monitor for now- stable.  Await repeat PET scan.  # Elevated BP- sec to Avastin-  monitor or  now- stable  # bone metastases-Hypocalcemia: mild- continue calcium 1200 plus vitamin D-3 1000-over-the-counter-1 pill a day. Stable; vit D 25-OH-pending.   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 On Eliquis-5 mg BID. stable.  #Bone metastases- CT DEc 7th, 2022 to healing process.  Dental evaluation on 9/21.  Continue calcium vitamin D intake. calcium 8.4- stable.   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  # left cheek papule-evaluation with dermatology.  Referral made.  # IV access/ port flush.   # DISPOSITION:  # referral to [facility] dermatology re: left cheek papule.   # Atezo+Bev today; do not wait for UA; HOLD zometa  # Follow up in 3 weeks- MD; labs- cbc/cmp; CEA; UA;Atezo+Bev;  possible Zometa; PET scan prior-. Dr.B

## 2023-01-16 LAB — CEA: CEA: 10.3 ng/mL — ABNORMAL HIGH (ref 0.0–4.7)

## 2023-01-31 ENCOUNTER — Ambulatory Visit
Admission: RE | Admit: 2023-01-31 | Discharge: 2023-01-31 | Disposition: A | Discharge status: Home / Self Care | Payer: Managed Care, Other (non HMO) | Source: Ambulatory Visit | Attending: Internal Medicine | Admitting: Internal Medicine

## 2023-01-31 DIAGNOSIS — J90 Pleural effusion, not elsewhere classified: Secondary | ICD-10-CM | POA: Insufficient documentation

## 2023-01-31 DIAGNOSIS — C787 Secondary malignant neoplasm of liver and intrahepatic bile duct: Secondary | ICD-10-CM | POA: Diagnosis not present

## 2023-01-31 DIAGNOSIS — C3432 Malignant neoplasm of lower lobe, left bronchus or lung: Secondary | ICD-10-CM | POA: Insufficient documentation

## 2023-01-31 DIAGNOSIS — R590 Localized enlarged lymph nodes: Secondary | ICD-10-CM | POA: Insufficient documentation

## 2023-01-31 DIAGNOSIS — R911 Solitary pulmonary nodule: Secondary | ICD-10-CM | POA: Insufficient documentation

## 2023-01-31 LAB — GLUCOSE, CAPILLARY: Glucose-Capillary: 92 mg/dL (ref 70–99)

## 2023-01-31 MED ORDER — FLUDEOXYGLUCOSE F - 18 (FDG) INJECTION
11.7000 | Freq: Once | INTRAVENOUS | Status: AC
  Administered 2023-01-31: 12.59 via INTRAVENOUS

## 2023-02-01 ENCOUNTER — Other Ambulatory Visit: Payer: Self-pay | Admitting: *Deleted

## 2023-02-01 ENCOUNTER — Inpatient Hospital Stay: Payer: Managed Care, Other (non HMO)

## 2023-02-01 ENCOUNTER — Telehealth: Payer: Self-pay | Admitting: *Deleted

## 2023-02-01 ENCOUNTER — Other Ambulatory Visit: Payer: Self-pay

## 2023-02-01 ENCOUNTER — Inpatient Hospital Stay (HOSPITAL_BASED_OUTPATIENT_CLINIC_OR_DEPARTMENT_OTHER): Payer: Managed Care, Other (non HMO) | Admitting: Medical Oncology

## 2023-02-01 VITALS — BP 135/95 | HR 87 | Temp 98.2°F | Resp 18 | Ht 71.0 in | Wt 223.0 lb

## 2023-02-01 DIAGNOSIS — C787 Secondary malignant neoplasm of liver and intrahepatic bile duct: Secondary | ICD-10-CM

## 2023-02-01 DIAGNOSIS — C3432 Malignant neoplasm of lower lobe, left bronchus or lung: Secondary | ICD-10-CM

## 2023-02-01 DIAGNOSIS — Z5112 Encounter for antineoplastic immunotherapy: Secondary | ICD-10-CM | POA: Diagnosis not present

## 2023-02-01 DIAGNOSIS — C3490 Malignant neoplasm of unspecified part of unspecified bronchus or lung: Secondary | ICD-10-CM

## 2023-02-01 DIAGNOSIS — C7931 Secondary malignant neoplasm of brain: Secondary | ICD-10-CM

## 2023-02-01 DIAGNOSIS — R5383 Other fatigue: Secondary | ICD-10-CM | POA: Diagnosis not present

## 2023-02-01 DIAGNOSIS — Z95828 Presence of other vascular implants and grafts: Secondary | ICD-10-CM

## 2023-02-01 LAB — CBC WITH DIFFERENTIAL (CANCER CENTER ONLY)
Abs Immature Granulocytes: 0.02 10*3/uL (ref 0.00–0.07)
Basophils Absolute: 0 10*3/uL (ref 0.0–0.1)
Basophils Relative: 1 %
Eosinophils Absolute: 0.1 10*3/uL (ref 0.0–0.5)
Eosinophils Relative: 2 %
HCT: 46 % (ref 39.0–52.0)
Hemoglobin: 15.1 g/dL (ref 13.0–17.0)
Immature Granulocytes: 0 %
Lymphocytes Relative: 22 %
Lymphs Abs: 1 10*3/uL (ref 0.7–4.0)
MCH: 30.9 pg (ref 26.0–34.0)
MCHC: 32.8 g/dL (ref 30.0–36.0)
MCV: 94.3 fL (ref 80.0–100.0)
Monocytes Absolute: 0.5 10*3/uL (ref 0.1–1.0)
Monocytes Relative: 12 %
Neutro Abs: 2.9 10*3/uL (ref 1.7–7.7)
Neutrophils Relative %: 63 %
Platelet Count: 146 10*3/uL — ABNORMAL LOW (ref 150–400)
RBC: 4.88 MIL/uL (ref 4.22–5.81)
RDW: 12.7 % (ref 11.5–15.5)
WBC Count: 4.5 10*3/uL (ref 4.0–10.5)
nRBC: 0 % (ref 0.0–0.2)

## 2023-02-01 LAB — CMP (CANCER CENTER ONLY)
ALT: 61 U/L — ABNORMAL HIGH (ref 0–44)
AST: 62 U/L — ABNORMAL HIGH (ref 15–41)
Albumin: 3.8 g/dL (ref 3.5–5.0)
Alkaline Phosphatase: 127 U/L — ABNORMAL HIGH (ref 38–126)
Anion gap: 7 (ref 5–15)
BUN: 10 mg/dL (ref 6–20)
CO2: 24 mmol/L (ref 22–32)
Calcium: 8.5 mg/dL — ABNORMAL LOW (ref 8.9–10.3)
Chloride: 107 mmol/L (ref 98–111)
Creatinine: 0.86 mg/dL (ref 0.61–1.24)
GFR, Estimated: >60 mL/min (ref >60)
Glucose, Bld: 106 mg/dL — ABNORMAL HIGH (ref 70–99)
Potassium: 3.7 mmol/L (ref 3.5–5.1)
Sodium: 138 mmol/L (ref 135–145)
Total Bilirubin: 0.6 mg/dL (ref 0.3–1.2)
Total Protein: 7.1 g/dL (ref 6.5–8.1)

## 2023-02-01 LAB — MAGNESIUM: Magnesium: 2 mg/dL (ref 1.7–2.4)

## 2023-02-01 MED ORDER — SERTRALINE HCL 50 MG PO TABS: ORAL_TABLET | ORAL | 1 refills | Status: DC

## 2023-02-01 MED ORDER — SODIUM CHLORIDE 0.9% FLUSH
10.0000 mL | Freq: Once | INTRAVENOUS | Status: DC
  Filled 2023-02-01: qty 10

## 2023-02-01 MED ORDER — HEPARIN SOD (PORK) LOCK FLUSH 100 UNIT/ML IV SOLN
500.0000 [IU] | Freq: Once | INTRAVENOUS | Status: AC
  Administered 2023-02-01: 500 [IU] via INTRAVENOUS
  Filled 2023-02-01: qty 5

## 2023-02-01 MED ORDER — ALTEPLASE 2 MG IJ SOLR
2.0000 mg | Freq: Once | INTRAMUSCULAR | Status: AC
  Administered 2023-02-01: 2 mg
  Filled 2023-02-01: qty 2

## 2023-02-01 NOTE — Telephone Encounter (Signed)
Call from [facility], wife reporting that patient is not feeling well is going home form work and going to bed which is totally out of character for him. He has not felt well for several days. Last evening, he had chills where his teeth were chattering, but he had no fever when checked. She cannot tell me any specific symptoms other than the chills and that he does not feel well. He is not eating much either. He asked her to see if he can come in for a lab check today. Please advise

## 2023-02-01 NOTE — Progress Notes (Addendum)
Symptom Management Clinic [facility] at [facility] [phone] [phone]  Patient Care Team: Pcp, No as PCP - General [name], RN as Oncology Nurse Navigator [name], MD as Consulting Physician (Internal Medicine)   Name of the patient: [name]  [id number]  Sep 19, 1981  Oncologist: Dr. Louretta Shorten   Oncological Diagnosis: Stage IV Lung Cancer Current Treatment: Maintenance Atezo-Bev Oncology History Overview Note  IMPRESSION: 1. Patchy nodular fat stranding throughout the anterior left upper quadrant peritoneal fat, nonspecific, cannot exclude peritoneal carcinomatosis. Dedicated CT abdomen/pelvis with oral and IV contrast recommended for further evaluation. 2. Dense patchy consolidation replacing much of the left lower lung lobe, appearing masslike in the superior segment left lower lobe, with associated bulging of the left major fissure and associated left lower lobe volume loss. Fine nodularity throughout both lungs with an upper lobe predominance. Asymmetric left upper lobe interlobular septal thickening. These findings are indeterminate, with differential including multilobar pneumonia, sarcoidosis or a neoplastic process. The persistence on radiographs back to 01/20/2021 despite antibiotic therapy make sarcoidosis or a neoplastic process more likely. Pulmonology consultation suggested for consideration of bronchoscopic evaluation. 3. Small dependent left pleural effusion. 4. Mild mediastinal lymphadenopathy, nonspecific. 5. Subacute healing lateral right sixth rib fracture.   DIAGNOSIS:  A. LUNG, LEFT LOWER LOBE; ENB-ASSISTED BIOPSY:  - NON-SMALL CELL CARCINOMA, FAVOR ADENOCARCINOMA.  - FOREIGN MATERIAL SUGGESTIVE OF ASPIRATION.   # EGFR MUTATED: 78 del- June 28th, 2022- [name]. [limited]  # Asymptomatic multiple brain mets subcentimeter asymptomatic -JUNE 29th, 2023- Numerous  metastatic lesions in the supratentorial brain-increase in number also conspicuity compared to January 2023.  Pre-SBRT MRI brain AUG 18th, 2023-  Eighteen small enhancing brain metastases (punctate to 11 mm); were all present on 03/28/2021-s/p SBRT SEP 2023- [[place]]  # SEP-OCT-worsening left-sided pleural effusion-status post paracentesis; exudative; Cytology negative- ?  Progressive disease.  # DEC 2023- CARBO-TAXOL-BEV+ATEZO   Cancer of lower lobe of left lung  03/23/2021 Initial Diagnosis   Cancer of lower lobe of left lung (HCC)   03/29/2021 Cancer Staging   Staging form: Lung, AJCC 8th Edition - Clinical: Stage IVB (cT3, cN3, pM1c) - Signed by [name], MD on 03/29/2021   03/30/2021 - 03/30/2021 Chemotherapy   Patient is on Treatment Plan : LUNG NSCLC Pemetrexed (Alimta) / Carboplatin q21d x 1 cycles     08/17/2022 -  Chemotherapy   Patient is on Treatment Plan : LUNG Atezolizumab + Bevacizumab + Carboplatin + Paclitaxel q21d Induction x 4 cycles / Atezolizumab + Bevacizumab q21d Maintenance       Date of visit: 02/01/23  Reason for Consult: [name] is a 42 y.o. male who presents today for:  He presents with his wife. Wife called in concerned today as patient has been more fatigued than normal. Sleeping more than he usually does. She also reports that his appetite is not where it normally is and he is not drinking as much as she thinks he should be. He reports that he feels off- similar to when he develop the liver metastasis. He is worried and anxious that his cancer has progressed. He denies any new pain, night sweats, yellowing of the skin. He has lost 3 pounds but reports this is secondary to not eating. He also denies any fevers, cold symptoms though his wife states that he has sneezed more than normal; he attributes this to allergies.   In terms of his zoloft he reports being off of this medication  for the past few months. Previously this helped him with depression  and anxiety. He thought things were stable and getting better so he stopped taking it. Upon questioning he does wonder if his fatigue may be secondary to depressed mood without SI.    Denies any neurologic complaints. Denies recent fevers or illnesses. Denies any easy bleeding or bruising.Denies chest pain. Denies any nausea, vomiting, constipation, or diarrhea. Denies urinary complaints. Patient offers no further specific complaints today.  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    PAST SURGICAL HISTORY:  Past Surgical History:  Procedure Laterality Date   FRACTURE SURGERY     broke femur when he was 6 years   PORTA CATH INSERTION N/A 03/28/2021   Procedure: PORTA CATH INSERTION;  Surgeon: Renford Dills, MD;  Location: [facility] INVASIVE CV LAB;  Service: Cardiovascular;  Laterality: N/A;   VIDEO BRONCHOSCOPY WITH ENDOBRONCHIAL NAVIGATION N/A 03/15/2021   Procedure: VIDEO BRONCHOSCOPY WITH ENDOBRONCHIAL NAVIGATION;  Surgeon: Vida Rigger, MD;  Location: [facility] ORS;  Service: Thoracic;  Laterality: N/A;   VIDEO BRONCHOSCOPY WITH ENDOBRONCHIAL ULTRASOUND N/A 03/15/2021   Procedure: VIDEO BRONCHOSCOPY WITH ENDOBRONCHIAL ULTRASOUND;  Surgeon: Vida Rigger, MD;  Location: [facility] ORS;  Service: Thoracic;  Laterality: N/A;    HEMATOLOGY/ONCOLOGY HISTORY:  Oncology History Overview Note  IMPRESSION: 1. Patchy nodular fat stranding throughout the anterior left upper quadrant peritoneal fat, nonspecific, cannot exclude peritoneal carcinomatosis. Dedicated CT abdomen/pelvis with oral and IV contrast recommended for further evaluation. 2. Dense patchy consolidation replacing much of the left lower lung lobe, appearing masslike in the  superior segment left lower lobe, with associated bulging of the left major fissure and associated left lower lobe volume loss. Fine nodularity throughout both lungs with an upper lobe predominance. Asymmetric left upper lobe interlobular septal thickening. These findings are indeterminate, with differential including multilobar pneumonia, sarcoidosis or a neoplastic process. The persistence on radiographs back to 01/20/2021 despite antibiotic therapy make sarcoidosis or a neoplastic process more likely. Pulmonology consultation suggested for consideration of bronchoscopic evaluation. 3. Small dependent left pleural effusion. 4. Mild mediastinal lymphadenopathy, nonspecific. 5. Subacute healing lateral right sixth rib fracture.   DIAGNOSIS:  A. LUNG, LEFT LOWER LOBE; ENB-ASSISTED BIOPSY:  - NON-SMALL CELL CARCINOMA, FAVOR ADENOCARCINOMA.  - FOREIGN MATERIAL SUGGESTIVE OF ASPIRATION.   # EGFR MUTATED: 25 del- June 28th, 2022- [name]. [limited]  # Asymptomatic multiple brain mets subcentimeter asymptomatic -JUNE 29th, 2023- Numerous metastatic lesions in the supratentorial brain-increase in number also conspicuity compared to January 2023.  Pre-SBRT MRI brain AUG 18th, 2023-  Eighteen small enhancing brain metastases (punctate to 11 mm); were all present on 03/28/2021-s/p SBRT SEP 2023- [[place]]  # SEP-OCT-worsening left-sided pleural effusion-status post paracentesis; exudative; Cytology negative- ?  Progressive disease.  # DEC 2023- CARBO-TAXOL-BEV+ATEZO   Cancer of lower lobe of left lung  03/23/2021 Initial Diagnosis   Cancer of lower lobe of left lung (HCC)   03/29/2021 Cancer Staging   Staging form: Lung, AJCC 8th Edition - Clinical: Stage IVB (cT3, cN3, pM1c) - Signed by [name], MD on 03/29/2021   03/30/2021 - 03/30/2021 Chemotherapy   Patient is on Treatment Plan :  LUNG NSCLC Pemetrexed (Alimta) / Carboplatin q21d x 1 cycles     08/17/2022 -  Chemotherapy    Patient is on Treatment Plan : LUNG Atezolizumab + Bevacizumab + Carboplatin + Paclitaxel q21d Induction x 4 cycles / Atezolizumab + Bevacizumab q21d Maintenance       ALLERGIES:  is allergic to codeine.  MEDICATIONS:  Current Outpatient Medications  Medication Sig Dispense Refill   apixaban (ELIQUIS) 5 MG TABS tablet Take 1 tablet (5 mg total) by mouth 2 (two) times daily. 60 tablet 2   calcium carbonate (TUMS EX) 750 MG chewable tablet Chew 1 tablet by mouth daily.     folic acid (FOLVITE) 1 MG tablet TAKE 1 TABLET BY MOUTH EVERY DAY 90 tablet 1   lidocaine-prilocaine (EMLA) cream Apply 1 Application topically as needed. 30 g 0   metoprolol succinate (TOPROL-XL) 50 MG 24 hr tablet Take 50 mg by mouth daily. Take with or immediately following a meal.     sertraline (ZOLOFT) 50 MG tablet sertraline (ZOLOFT) 50 MG tablet TAKE 1/2 TABLET BY MOUTH DAILY 30 tablet 1   gabapentin (NEURONTIN) 100 MG capsule Take 1 capsule (100 mg total) by mouth at bedtime. (Patient not taking: Reported on 11/13/2022) 30 capsule 1   omeprazole (PRILOSEC) 20 MG capsule Take 1 capsule (20 mg total) by mouth daily. (Patient not taking: Reported on 11/13/2022) 30 capsule 2   No current facility-administered medications for this visit.   Facility-Administered Medications Ordered in Other Visits  Medication Dose Route Frequency Provider Last Rate Last Admin   heparin lock flush 100 UNIT/ML injection            heparin lock flush 100 UNIT/ML injection            sodium chloride flush (NS) 0.9 % injection 10 mL  10 mL Intravenous PRN [name], MD   10 mL at 07/28/21 0852   sodium chloride flush (NS) 0.9 % injection 10 mL  10 mL Intravenous Once Jerman Tinnon M, PA-C        VITAL SIGNS: BP (!) 135/95   Pulse 87   Temp 98.2 F (36.8 C) (Tympanic)   Resp 18   Ht 5\' 11"  (1.803 m)   Wt 223 lb (101.2 kg)   SpO2 98% Comment: room air  BMI 31.10 kg/m  Filed Weights   02/01/23 1325  Weight: 223 lb  (101.2 kg)    Estimated body mass index is 31.1 kg/m as calculated from the following:   Height as of this encounter: 5\' 11"  (1.803 m).   Weight as of this encounter: 223 lb (101.2 kg).  LABS: CBC:    Component Value Date/Time   WBC 4.5 02/01/2023 1253   WBC 5.7 01/15/2023 1414   HGB 15.1 02/01/2023 1253   HCT 46.0 02/01/2023 1253   PLT 146 (L) 02/01/2023 1253   MCV 94.3 02/01/2023 1253   NEUTROABS 2.9 02/01/2023 1253   LYMPHSABS 1.0 02/01/2023 1253   MONOABS 0.5 02/01/2023 1253   EOSABS 0.1 02/01/2023 1253   BASOSABS 0.0 02/01/2023 1253   Comprehensive Metabolic Panel:    Component Value Date/Time   NA 138 02/01/2023 1253   K 3.7 02/01/2023 1253   CL 107 02/01/2023 1253   CO2 24 02/01/2023 1253   BUN 10 02/01/2023 1253   CREATININE 0.86 02/01/2023 1253   GLUCOSE 106 (H) 02/01/2023 1253   CALCIUM 8.5 (L) 02/01/2023 1253   AST 62 (H) 02/01/2023 1253   ALT 61 (H)  02/01/2023 1253   ALKPHOS 127 (H) 02/01/2023 1253   BILITOT 0.6 02/01/2023 1253   PROT 7.1 02/01/2023 1253   ALBUMIN 3.8 02/01/2023 1253    RADIOGRAPHIC STUDIES: NM PET Image Restage (PS) Skull Base to Thigh (F-18 FDG)  Result Date: 02/01/2023 CLINICAL DATA:  Subsequent treatment strategy for lung malignancy. EXAM: NUCLEAR MEDICINE PET SKULL BASE TO THIGH TECHNIQUE: 12.6 mCi F-18 FDG was injected intravenously. Full-ring PET imaging was performed from the skull base to thigh after the radiotracer. CT data was obtained and used for attenuation correction and anatomic localization. Fasting blood glucose: 92 mg/dl COMPARISON:  PET-CT dated November 08, 2022 FINDINGS: Mediastinal blood pool activity: SUV max 2.3 Liver activity: SUV max NA NECK: No hypermetabolic lymph nodes in the neck. Incidental CT findings: None. CHEST: New hypermetabolic subcentimeter left axillary lymph node with SUV max of 3.1. 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 Subcentimeter left axillary lymph node measuring 6 mm in short axis on series 4, image 3, unchanged in size when compared with the prior exam with SUV max of 3.1. Incidental CT findings: Right chest wall port with tip in the right atrium. Small hiatal hernia. ABDOMEN/PELVIS: Increased irregular areas of FDG uptake associated with previously described metastatic liver lesions. Reference lesion of the right hepatic lobe with SUV max of 7.2 on series 610, image 88 not well visualized on associated noncontrast CT. No abnormal hypermetabolic activity within the, pancreas, adrenal glands, or spleen. No hypermetabolic lymph nodes in the abdomen or pelvis. Incidental CT findings: None. SKELETON: No focal hypermetabolic activity to suggest skeletal metastasis. Incidental CT findings: None. IMPRESSION: 1. 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 2. New irregular areas of hypermetabolic activity in the liver associated with previously described metastatic liver lesions, findings may be due to pseudoprogression or progressive disease. Contrast-enhanced liver MRI could be performed to better assess for tumor viability, alternatively short-term follow-up PET CT could be performed. 3. Subcentimeter left axillary lymph node new mild FDG uptake, possibly reactive, although progressive disease is a concern. Electronically Signed   By: Allegra Lai M.D.   On: 02/01/2023 12:43    PERFORMANCE STATUS (ECOG) : 2 - Symptomatic, <50% confined to bed  Review of Systems Unless otherwise noted, a complete review of systems is negative.  Physical Exam General: NAD HEENT: Mild facial redness  LYMPH: No palpable lymphadenopathy  Cardiovascular: regular rate and  rhythm Pulmonary: clear ant fields Abdomen: soft, non-tender and without ascites  Extremities: no edema, no joint deformities Skin: no rashes Neurological: Weakness but otherwise nonfocal  Assessment and Plan- Patient is a 42 y.o. male    Encounter Diagnoses  Name Primary?   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  While on osimertinib- NOV 1st, 2023- PET scan- shows progressive disease in liver. Liquid Biopsy/Guardant testing-positive for EGFR mutation; otherwise Negative for any novel mutations.  Repeat liver Biopsy positive for adenocarcinoma. NEGATIVE for MET amplification.  S/p second opinion at [facility]- Dr.Stinchcomb.    # status post 8 cycles of carbo Taxol-Atezo+Bev- JAN 25th, 2024- Interval  response to therapy with resolution of previously demonstrated extensive hypermetabolic activity throughout the liver. No residual abnormal hypermetabolic activity within the chest, abdomen or pelvis. Stable chronic loculated left pleural effusion/fibrothorax with compressive atelectasis of the remaining left lung.  Mild diffuse osseous activity attributed to treatment response.  Today he is not feeling the best- low appetite and fatigue. No signs of infection. CBC and CMP unremarkable which are a bit reasurring. CEA pending but had increased from 8.6 to 10.2 over the past month though it should be noted that his CEA has waxed and waned from 10.5-5.5 over the past 4 months. He declines IVF today. I do think there is a component of anxiety/depression and we have all agreed that patient would benefit from going back on his zoloft which I have refilled. In addition we reviewed his recent PET scan which showed some pseudo progression vs progression in his liver, pleural cavity, in addition to one mild hypermetabolic left axillary. This  lymph node is not palpable on exam today. We have elected to proceed forward with MR liver to better understand if this is true progression as this will help guide our recommendations and treatment. For now he will continue to support his body with fluids/food/sleep. He does not feel that he will need assistance with these items. Also asking radiation oncology about the potential for moving up his brain MR given PET findings. Answer pending.   Patient expressed understanding and was in agreement with this plan. He also understands that He can call clinic at any time with any questions, concerns, or complaints.   Thank you for allowing me to participate in the care of this very pleasant patient.   Time Total: 30  Visit consisted of counseling and education dealing with the complex and emotionally intense issues of symptom management in the setting of serious illness.Greater than 50%  of this time was spent counseling and coordinating care related to the above assessment and plan.  Signed by: Clent Jacks, PA-C

## 2023-02-01 NOTE — Telephone Encounter (Signed)
Spoke with wife. I asked her to have pt here by 1245 to get labs drawn from port/ smc visit with Maralyn Sago and +/- iv fluids. Wife feels like her husband's cancer may have progressed. Pt had pet scan performed yesterday and pt has felt poorly since getting the pet scan. Pt has not had a good appetite and is not eating/drinking much for the last several days.

## 2023-02-02 LAB — CEA: CEA: 12.3 ng/mL — ABNORMAL HIGH (ref 0.0–4.7)

## 2023-02-03 ENCOUNTER — Ambulatory Visit
Admission: RE | Admit: 2023-02-03 | Discharge: 2023-02-03 | Disposition: A | Discharge status: Home / Self Care | Payer: Managed Care, Other (non HMO) | Source: Ambulatory Visit | Attending: Medical Oncology | Admitting: Medical Oncology

## 2023-02-03 DIAGNOSIS — C3490 Malignant neoplasm of unspecified part of unspecified bronchus or lung: Secondary | ICD-10-CM

## 2023-02-03 DIAGNOSIS — C787 Secondary malignant neoplasm of liver and intrahepatic bile duct: Secondary | ICD-10-CM | POA: Diagnosis present

## 2023-02-03 MED ORDER — GADOBUTROL 1 MMOL/ML IV SOLN
10.0000 mL | Freq: Once | INTRAVENOUS | Status: AC | PRN
  Administered 2023-02-03: 10 mL via INTRAVENOUS

## 2023-02-04 ENCOUNTER — Telehealth: Payer: Self-pay | Admitting: Medical Oncology

## 2023-02-04 NOTE — Telephone Encounter (Signed)
We discussed his CEA level as well as his MRI results in detail. He is aware that the MRI did not show worsening or progressing disease which is great news. Still some residual disease in liver. Pleural effusion looks stable. He has follow up tomorrow with Dr. Kizzie Bane which he will keep.

## 2023-02-05 ENCOUNTER — Encounter: Payer: Self-pay | Admitting: Internal Medicine

## 2023-02-05 ENCOUNTER — Inpatient Hospital Stay (HOSPITAL_BASED_OUTPATIENT_CLINIC_OR_DEPARTMENT_OTHER): Payer: Managed Care, Other (non HMO) | Admitting: Internal Medicine

## 2023-02-05 ENCOUNTER — Inpatient Hospital Stay: Payer: Managed Care, Other (non HMO)

## 2023-02-05 VITALS — BP 125/90 | HR 85 | Temp 97.6°F | Ht 71.0 in | Wt 215.8 lb

## 2023-02-05 DIAGNOSIS — R5383 Other fatigue: Secondary | ICD-10-CM

## 2023-02-05 DIAGNOSIS — C3432 Malignant neoplasm of lower lobe, left bronchus or lung: Secondary | ICD-10-CM

## 2023-02-05 DIAGNOSIS — Z5112 Encounter for antineoplastic immunotherapy: Secondary | ICD-10-CM | POA: Diagnosis not present

## 2023-02-05 LAB — CBC WITH DIFFERENTIAL/PLATELET
Abs Immature Granulocytes: 0.04 10*3/uL (ref 0.00–0.07)
Basophils Absolute: 0.1 10*3/uL (ref 0.0–0.1)
Basophils Relative: 1 %
Eosinophils Absolute: 0.2 10*3/uL (ref 0.0–0.5)
Eosinophils Relative: 2 %
HCT: 48.6 % (ref 39.0–52.0)
Hemoglobin: 16.1 g/dL (ref 13.0–17.0)
Immature Granulocytes: 1 %
Lymphocytes Relative: 16 %
Lymphs Abs: 1.2 10*3/uL (ref 0.7–4.0)
MCH: 31 pg (ref 26.0–34.0)
MCHC: 33.1 g/dL (ref 30.0–36.0)
MCV: 93.6 fL (ref 80.0–100.0)
Monocytes Absolute: 0.7 10*3/uL (ref 0.1–1.0)
Monocytes Relative: 9 %
Neutro Abs: 5.4 10*3/uL (ref 1.7–7.7)
Neutrophils Relative %: 71 %
Platelets: 168 10*3/uL (ref 150–400)
RBC: 5.19 MIL/uL (ref 4.22–5.81)
RDW: 12.8 % (ref 11.5–15.5)
WBC: 7.6 10*3/uL (ref 4.0–10.5)
nRBC: 0 % (ref 0.0–0.2)

## 2023-02-05 LAB — COMPREHENSIVE METABOLIC PANEL
ALT: 72 U/L — ABNORMAL HIGH (ref 0–44)
AST: 72 U/L — ABNORMAL HIGH (ref 15–41)
Albumin: 3.7 g/dL (ref 3.5–5.0)
Alkaline Phosphatase: 131 U/L — ABNORMAL HIGH (ref 38–126)
Anion gap: 6 (ref 5–15)
BUN: 11 mg/dL (ref 6–20)
CO2: 26 mmol/L (ref 22–32)
Calcium: 8.9 mg/dL (ref 8.9–10.3)
Chloride: 104 mmol/L (ref 98–111)
Creatinine, Ser: 0.94 mg/dL (ref 0.61–1.24)
GFR, Estimated: >60 mL/min (ref >60)
Glucose, Bld: 96 mg/dL (ref 70–99)
Potassium: 4.3 mmol/L (ref 3.5–5.1)
Sodium: 136 mmol/L (ref 135–145)
Total Bilirubin: 0.6 mg/dL (ref 0.3–1.2)
Total Protein: 7.5 g/dL (ref 6.5–8.1)

## 2023-02-05 LAB — CORTISOL: Cortisol, Plasma: 12.1 ug/dL

## 2023-02-05 LAB — PROTEIN, URINE, RANDOM: Total Protein, Urine: 9 mg/dL

## 2023-02-05 LAB — VITAMIN D 25 HYDROXY (VIT D DEFICIENCY, FRACTURES): Vit D, 25-Hydroxy: 21.66 ng/mL — ABNORMAL LOW (ref 30–100)

## 2023-02-05 MED ORDER — DEXAMETHASONE SODIUM PHOSPHATE 10 MG/ML IJ SOLN: 6.0000 mg | Freq: Once | INTRAMUSCULAR | Status: DC

## 2023-02-05 MED ORDER — SODIUM CHLORIDE 0.9% FLUSH
10.0000 mL | INTRAVENOUS | Status: DC | PRN
  Administered 2023-02-05: 10 mL via INTRAVENOUS
  Filled 2023-02-05: qty 10

## 2023-02-05 MED ORDER — SODIUM CHLORIDE 0.9 % IV SOLN: 6.0000 mg | Freq: Once | INTRAVENOUS | Status: DC

## 2023-02-05 MED ORDER — DEXAMETHASONE SODIUM PHOSPHATE 10 MG/ML IJ SOLN
6.0000 mg | Freq: Once | INTRAMUSCULAR | Status: AC
  Administered 2023-02-05: 6 mg via INTRAVENOUS
  Filled 2023-02-05: qty 1

## 2023-02-05 NOTE — Assessment & Plan Note (Addendum)
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 s /p second opinion at [facility]- Dr.Stinchcomb. status post 4 cycles of carbo Taxol-Atezo+Bev- JAN 25th, 2024-PET- Interval response to therapy with resolution of previously demonstrated extensive hypermetabolic activity throughout the liver.   # Currently on maintenance Atezo+Bev- PET scan April 17th- Unchanged appearance of chronic loculated left pleural effusion/fibrothorax with new hypermetabolic activity of the pleura and adjacent consolidations, findings may be due to pseudoprogression or progressive disease. New irregular areas of hypermetabolic activity in the liver associated with previously described metastatic liver lesions, findings may be due to pseudoprogression or progressive disease. April 21st- liver MRI-  showed positive response to therapy when compared to prior abdominal MRI in terms of regression of diffuse hepatic metastatic disease.  Much of these imaging findings may be attributable to developing areas of hepatic fibrosis at the site of treated lesions, however, given the hypermetabolism in these regions on the recent PET-CT, some degree of residual tumor is not excluded. No new lesions are identified. Continued attention on follow-up studies is recommended.  # HOLD maintenance- Cycle #5  -Atezo + bev every 3 weeks today-given patient's ongoing extreme unexplained fatigue.  Today. Labs today reviewed;   # Unexplained severe fatigue-question endocrinopathy from immunotherapy.  Check thyroid panel testosterone cortisol level; ACTH.  Also discussed regarding considering MRI brain and extreme fatigue to rule out any central pituitary inflammation.  Discussed with Dr. Basilio Cairo, rad onc.   # Nose bleed: sec to Avastin/dry weather- on blowing- continue- Vaseline prn. Stable.   #Asymptomatic multiple brain mets subcentimeter asymptomatic- Pre-SBRT MRI brain AUG 18th, 2023- Eighteen small  enhancing brain metastases (punctate to 11 mm)-s/p SBRT SEP 2023- [[place]].  FEB 12th, 2024- Numerous new punctate foci of enhancement in the bilateral cerebral and cerebellar hemispheres, as detailed above and concerning for progressive metastatic disease. S/p SBRT [[place]]- FEB 12th, 2024.  Consider repeating the scan sooner given the extreme fatigue.  Recommend dexamethasone 6 mg after the labs are drawn.  #Elevated LFTs: AST ALT 50-60s range; elevated alkaline phosphatase normal bilirubin-likely secondary progressive disease in the liver. APRIL 2024 PET /MRI liver- . Diffuse hepatic metastatic disease with numerous lesions throughout both lobes of the liver--overall improvement.    # Elevated BP- sec to Avastin-  monitor or now- stable  # bone metastases-Hypocalcemia: mild- continue calcium 1200 plus vitamin D-3 1000-over-the-counter-1 pill a day. Stable; vit D 25-OH-pending.   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 On Eliquis-5 mg BID. stable.  #Bone metastases- CT DEc 7th, 2022 to healing process.  Dental evaluation on 9/21.  Continue calcium vitamin D intake. calcium 8.4- stable.   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  # left cheek papule-evaluation with dermatology.  Referral made.  # IV access/ port flush.   # DISPOSITION:  # labs to be drawn NOW- ordered. # Dex 6 mg IV today- ONLY.  # HOLD Atezo+Bev; HOLD zometa # Follow up in 2  weeks- MD; labs- cbc/cmp; CEA; UA; Atezo+Bev;  possible Zometa;  Dr.B

## 2023-02-05 NOTE — Progress Notes (Signed)
Pt states he has had more fatigue the last 2 weeks.

## 2023-02-05 NOTE — Progress Notes (Signed)
[name] CONSULT NOTE  Patient Care Team: Pcp, No as PCP - General [name], RN as Oncology Nurse Navigator [name], MD as Consulting Physician (Internal Medicine)  CHIEF COMPLAINTS/PURPOSE OF CONSULTATION: Lung cancer  #  Oncology History Overview Note  IMPRESSION: 1. Patchy nodular fat stranding throughout the anterior left upper quadrant peritoneal fat, nonspecific, cannot exclude peritoneal carcinomatosis. Dedicated CT abdomen/pelvis with oral and IV contrast recommended for further evaluation. 2. Dense patchy consolidation replacing much of the left lower lung lobe, appearing masslike in the superior segment left lower lobe, with associated bulging of the left major fissure and associated left lower lobe volume loss. Fine nodularity throughout both lungs with an upper lobe predominance. Asymmetric left upper lobe interlobular septal thickening. These findings are indeterminate, with differential including multilobar pneumonia, sarcoidosis or a neoplastic process. The persistence on radiographs back to 01/20/2021 despite antibiotic therapy make sarcoidosis or a neoplastic process more likely. Pulmonology consultation suggested for consideration of bronchoscopic evaluation. 3. Small dependent left pleural effusion. 4. Mild mediastinal lymphadenopathy, nonspecific. 5. Subacute healing lateral right sixth rib fracture.   DIAGNOSIS:  A. LUNG, LEFT LOWER LOBE; ENB-ASSISTED BIOPSY:  - NON-SMALL CELL CARCINOMA, FAVOR ADENOCARCINOMA.  - FOREIGN MATERIAL SUGGESTIVE OF ASPIRATION.   # EGFR MUTATED: 47 del- June 28th, 2022- [name]. [limited]  # Asymptomatic multiple brain mets subcentimeter asymptomatic -JUNE 29th, 2023- Numerous metastatic lesions in the supratentorial brain-increase in number also conspicuity compared to January 2023.  Pre-SBRT MRI brain AUG 18th, 2023-  Eighteen small enhancing brain metastases (punctate to 11 mm); were all  present on 03/28/2021-s/p SBRT SEP 2023- [[place]]; SRS/ radiation treatment for brain metastasis He finished treatment on 11-26-22.   # SEP-OCT-worsening left-sided pleural effusion-status post paracentesis; exudative; Cytology negative- ?  Progressive disease.  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  While on osimertinib- NOV 1st, 2023- PET scan- shows progressive disease in liver. Liquid Biopsy/Guardant testing-positive for EGFR mutation; otherwise Negative for any novel mutations.  Repeat liver Biopsy positive for adenocarcinoma. NEGATIVE for MET amplification.  S/p second opinion at [facility]- Dr.Stinchcomb. status post 4 cycles of carbo Taxol-Atezo+Bev- JAN 25th, 2024-PET- Interval response to therapy with resolution of previously demonstrated extensive hypermetabolic activity throughout the liver.    Cancer of lower lobe of left lung  03/23/2021 Initial Diagnosis   Cancer of lower lobe of left lung (HCC)   03/29/2021 Cancer Staging   Staging form: Lung, AJCC 8th Edition - Clinical: Stage IVB (cT3, cN3, pM1c) - Signed by [name], MD on 03/29/2021   03/30/2021 - 03/30/2021 Chemotherapy   Patient is on Treatment Plan : LUNG NSCLC Pemetrexed (Alimta) / Carboplatin q21d x 1 cycles     08/17/2022 -  Chemotherapy   Patient is on Treatment Plan : LUNG Atezolizumab + Bevacizumab + Carboplatin + Paclitaxel q21d Induction x 4 cycles / Atezolizumab + Bevacizumab q21d Maintenance      HISTORY OF PRESENTING ILLNESS: Ambulating independently.  Alone.   [name] 42 y.o.  male patient with stage IV lung cancer adenocarcinoma-brain mets [synchronous] bone mets EGFR mutated -currently on maintenance Atezo-Bev is here for follow-up/review results of his PET scan and also liver MRI.  In the interim patient was evaluated in the [facility] for feeling "poorly".  Labs unremarkable.  Pt states he has had more  fatigue the last 2 weeks. Complains of extreme fatigue. Denies any worsening headaches.  No worsening pain. Intermittent nosebleeds- none currently.   Abdominal pain improved.  Denies any worsening pain.  No worsening skin rash.   Review of Systems  Constitutional:  Positive for malaise/fatigue. Negative for chills, diaphoresis and fever.  HENT:  Negative for nosebleeds and sore throat.   Eyes:  Negative for double vision.  Respiratory:  Positive for cough. Negative for wheezing.   Cardiovascular:  Negative for chest pain, palpitations, orthopnea and leg swelling.  Gastrointestinal:  Positive for heartburn and nausea. Negative for abdominal pain, blood in stool, diarrhea and melena.  Genitourinary:  Negative for dysuria, frequency and urgency.  Musculoskeletal:  Negative for back pain and joint pain.  Skin:  Negative for itching.  Neurological:  Negative for tingling and focal weakness.  Endo/Heme/Allergies:  Does not bruise/bleed easily.  Psychiatric/Behavioral:  Negative for depression. The patient is not nervous/anxious and does not have insomnia.      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    SURGICAL HISTORY: Past Surgical History:  Procedure Laterality Date   FRACTURE SURGERY     broke femur when he was 6 years   PORTA CATH INSERTION N/A 03/28/2021   Procedure: PORTA CATH INSERTION;  Surgeon: Renford Dills, MD;  Location: [facility] INVASIVE CV LAB;  Service: Cardiovascular;  Laterality: N/A;   VIDEO BRONCHOSCOPY WITH ENDOBRONCHIAL NAVIGATION N/A 03/15/2021   Procedure: VIDEO BRONCHOSCOPY WITH ENDOBRONCHIAL NAVIGATION;  Surgeon: Vida Rigger, MD;  Location: [facility] ORS;  Service: Thoracic;  Laterality: N/A;   VIDEO BRONCHOSCOPY WITH ENDOBRONCHIAL ULTRASOUND N/A 03/15/2021   Procedure: VIDEO BRONCHOSCOPY WITH ENDOBRONCHIAL ULTRASOUND;  Surgeon: Vida Rigger, MD;  Location: [facility] ORS;  Service: Thoracic;  Laterality: N/A;    SOCIAL HISTORY: Social History   Socioeconomic History   Marital status: Married    Spouse name: Darl Pikes   Number of children: 2   Years of education: Not on file   Highest education level: Not on file  Occupational History   Not on file  Tobacco Use   Smoking status: Never   Smokeless tobacco: Former    Types: Snuff, Chew  Vaping Use   Vaping Use: Never used  Substance and Sexual Activity   Alcohol use: Never   Drug use: Never   Sexual activity: Yes  Other Topics Concern   Not on file  Social History Narrative   [place] in [place]; with wife; 2 daughters[12 and 22]; never smoked; rare alcohol. 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 Dog and cat.   Social Determinants of Health   Financial Resource Strain: Low Risk  (05/10/2022)   Overall Financial Resource Strain (CARDIA)    Difficulty of Paying Living Expenses: Not hard at all  Food Insecurity: No Food Insecurity (05/10/2022)   Hunger Vital Sign    Worried About Running Out of Food in the Last Year: Never true    Ran Out of Food in the Last Year: Never true  Transportation Needs: No Transportation Needs (03/16/2022)   PRAPARE - Administrator, Civil Service (Medical): No    Lack of Transportation (Non-Medical): No  Physical Activity: Not on file  Stress: Not on file  Social Connections: Socially Integrated (05/10/2022)   Social Connection and Isolation Panel [NHANES]    Frequency of Communication with Friends and Family: More than three times a week    Frequency of  Social Gatherings with Friends and Family: More than three times a week    Attends Religious Services: More than 4 times per year    Active Member of [employer] or Organizations: Yes    Attends Engineer, structural: More than 4 times per year    Marital Status: Married  Catering manager Violence: Not on file    FAMILY HISTORY: Family History  Problem Relation Age of Onset   High Cholesterol  Mother    Hypertension Father    Lung cancer Father    Skin cancer Father     ALLERGIES:  is allergic to codeine.  MEDICATIONS:  Current Outpatient Medications  Medication Sig Dispense Refill   apixaban (ELIQUIS) 5 MG TABS tablet Take 1 tablet (5 mg total) by mouth 2 (two) times daily. 60 tablet 2   calcium carbonate (TUMS EX) 750 MG chewable tablet Chew 1 tablet by mouth daily.     folic acid (FOLVITE) 1 MG tablet TAKE 1 TABLET BY MOUTH EVERY DAY 90 tablet 1   lidocaine-prilocaine (EMLA) cream Apply 1 Application topically as needed. 30 g 0   metoprolol succinate (TOPROL-XL) 50 MG 24 hr tablet Take 50 mg by mouth daily. Take with or immediately following a meal.     sertraline (ZOLOFT) 50 MG tablet sertraline (ZOLOFT) 50 MG tablet TAKE 1/2 TABLET BY MOUTH DAILY 30 tablet 1   gabapentin (NEURONTIN) 100 MG capsule Take 1 capsule (100 mg total) by mouth at bedtime. (Patient not taking: Reported on 11/13/2022) 30 capsule 1   omeprazole (PRILOSEC) 20 MG capsule Take 1 capsule (20 mg total) by mouth daily. (Patient not taking: Reported on 11/13/2022) 30 capsule 2   No current facility-administered medications for this visit.   Facility-Administered Medications Ordered in Other Visits  Medication Dose Route Frequency Provider Last Rate Last Admin   heparin lock flush 100 UNIT/ML injection            heparin lock flush 100 UNIT/ML injection            sodium chloride flush (NS) 0.9 % injection 10 mL  10 mL Intravenous PRN [name], MD   10 mL at 07/28/21 0852      .  PHYSICAL EXAMINATION: ECOG PERFORMANCE STATUS: 1 - Symptomatic but completely ambulatory  Vitals:   02/05/23 0825  BP: (!) 125/90  Pulse: 85  Temp: 97.6 F (36.4 C)  SpO2: 97%      Filed Weights   02/05/23 0825  Weight: 215 lb 12.8 oz (97.9 kg)     Left cheek papular lesion noted.  No signs of infection.  Physical Exam HENT:     Head: Normocephalic and atraumatic.     Mouth/Throat:      Pharynx: No oropharyngeal exudate.  Eyes:     Pupils: Pupils are equal, round, and reactive to light.  Cardiovascular:     Rate and Rhythm: Normal rate and regular rhythm.  Pulmonary:     Effort: No respiratory distress.     Breath sounds: No wheezing.     Comments: Decreased breath sound on the left side compared to right. Abdominal:     General: Bowel sounds are normal. There is no distension.     Palpations: Abdomen is soft. There is no mass.     Tenderness: There is no abdominal tenderness. There is no guarding or rebound.  Musculoskeletal:        General: No tenderness. Normal range of motion.     Cervical  back: Normal range of motion and neck supple.  Skin:    General: Skin is warm.  Neurological:     Mental Status: He is alert and oriented to person, place, and time.  Psychiatric:        Mood and Affect: Affect normal.    Fungal dermatitis bilateral buttocks groin/underneath abdominal pannus  LABORATORY DATA:  I have reviewed the data as listed Lab Results  Component Value Date   WBC 7.6 02/05/2023   HGB 16.1 02/05/2023   HCT 48.6 02/05/2023   MCV 93.6 02/05/2023   PLT 168 02/05/2023   Recent Labs    01/15/23 1414 02/01/23 1253 02/05/23 0833  NA 136 138 136  K 3.6 3.7 4.3  CL 107 107 104  CO2 GLUCOSE 112* 106* 96  BUN 7 10 PENDING  CREATININE 0.90 0.86 0.94  CALCIUM 8.2* 8.5* 8.9  GFRNONAA >60 >60 >60  PROT 7.0 7.1 PENDING  ALBUMIN 3.7 3.8 PENDING  AST 58* 62* PENDING  ALT 64* 61* PENDING  ALKPHOS 138* 127* 131*  BILITOT 0.5 0.6 PENDING    RADIOGRAPHIC STUDIES: I have personally reviewed the radiological images as listed and agreed with the findings in the report. MR LIVER W WO CONTRAST  Result Date: 02/03/2023 CLINICAL DATA:  42 year old male with history of lung cancer with hypermetabolic liver lesions noted on prior PET-CT suspicious for metastatic disease. EXAM: MRI ABDOMEN WITHOUT AND WITH CONTRAST TECHNIQUE: Multiplanar  multisequence MR imaging of the abdomen was performed both before and after the administration of intravenous contrast. CONTRAST:  10mL GADAVIST GADOBUTROL 1 MMOL/ML IV SOLN COMPARISON:  Abdominal MRI 09/04/2022.  PET-CT 01/31/2023. FINDINGS: Lower chest: Large complex left pleural fluid collection partially imaged, with substantial internal debris. Hepatobiliary: Please note that the metastatic lesions in the liver seen on the prior MRI were most evident on the fat saturated T2 weighted sequence of the prior examination (series 6 of the prior study), and accordingly, direct comparison with the prior examination are made predominantly from the corresponding series 15 of today's study. The majority of the previously noted areas of T2 signal intensity throughout the right lobe of the liver on the prior examination have resolved, with a notable exception in segment 5 where there is a persistent mass-like area (axial image 19 of series 15) measuring 3.8 x 3.3 cm on today's study which is mildly T2 hyperintense, but very nondescript on post gadolinium imaging, but with some mild diffusion restriction. Notably, there is focal capsular contraction overlying this area. More widespread rather infiltrative appearing T2 hyperintensity is noted throughout the left lobe of the liver, also rather unremarkable on post gadolinium imaging, but demonstrating mild diffuse diffusion restriction. No definite new hepatic lesions are confidently identified. Pancreas: No pancreatic mass. No pancreatic ductal dilatation. No pancreatic or peripancreatic fluid collections or inflammatory changes. Spleen:  Unremarkable. Adrenals/Urinary Tract: Bilateral kidneys and bilateral adrenal glands are normal in appearance. No hydroureteronephrosis in the visualized portions of the abdomen. Stomach/Bowel: Visualized portions are unremarkable. Vascular/Lymphatic: No aneurysm identified in the visualized abdominal vasculature. No lymphadenopathy noted in  the abdomen. Other: No significant volume of ascites noted in the visualized portions of the peritoneal cavity. Musculoskeletal: No aggressive appearing osseous lesions are noted in the visualized portions of the skeleton. IMPRESSION: 1. Today's study demonstrates a positive response to therapy when compared to prior abdominal MRI in terms of regression of diffuse hepatic metastatic disease. There are multiple residual areas of T2 signal abnormality which correspond to  the previously recognized lesions, however, these appear less extensive, and are now very nondescript on post gadolinium imaging with some associated capsular retraction and diffusion restriction. Much of these imaging findings may be attributable to developing areas of hepatic fibrosis at the site of treated lesions, however, given the hypermetabolism in these regions on the recent PET-CT, some degree of residual tumor is not excluded. No new lesions are identified. Continued attention on follow-up studies is recommended. 2. Large complex left pleural effusion. Electronically Signed   By: Trudie Reed M.D.   On: 02/03/2023 11:00   NM PET Image Restage (PS) Skull Base to Thigh (F-18 FDG)  Result Date: 02/01/2023 CLINICAL DATA:  Subsequent treatment strategy for lung malignancy. EXAM: NUCLEAR MEDICINE PET SKULL BASE TO THIGH TECHNIQUE: 12.6 mCi F-18 FDG was injected intravenously. Full-ring PET imaging was performed from the skull base to thigh after the radiotracer. CT data was obtained and used for attenuation correction and anatomic localization. Fasting blood glucose: 92 mg/dl COMPARISON:  PET-CT dated November 08, 2022 FINDINGS: Mediastinal blood pool activity: SUV max 2.3 Liver activity: SUV max NA NECK: No hypermetabolic lymph nodes in the neck. Incidental CT findings: None. CHEST: New hypermetabolic subcentimeter left axillary lymph node with SUV max of 3.1. Appearance of chronic loculated left pleural effusion/fibrothorax with adjacent  linear opacities is unchanged, although there is new hypermetabolic activity of the pleura and adjacent consolidations, SUV max of 4.9. Subcentimeter left axillary lymph node measuring 6 mm in short axis on series 4, image 3, unchanged in size when compared with the prior exam with SUV max of 3.1. Incidental CT findings: Right chest wall port with tip in the right atrium. Small hiatal hernia. ABDOMEN/PELVIS: Increased irregular areas of FDG uptake associated with previously described metastatic liver lesions. Reference lesion of the right hepatic lobe with SUV max of 7.2 on series 610, image 88 not well visualized on associated noncontrast CT. No abnormal hypermetabolic activity within the, pancreas, adrenal glands, or spleen. No hypermetabolic lymph nodes in the abdomen or pelvis. Incidental CT findings: None. SKELETON: No focal hypermetabolic activity to suggest skeletal metastasis. Incidental CT findings: None. IMPRESSION: 1. 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 2. New irregular areas of hypermetabolic activity in the liver associated with previously described metastatic liver lesions, findings may be due to pseudoprogression or progressive disease. Contrast-enhanced liver MRI could be performed to better assess for tumor viability, alternatively short-term follow-up PET CT could be performed. 3. Subcentimeter left axillary lymph node new mild FDG uptake, possibly reactive, although progressive disease is a concern. Electronically Signed   By: Allegra Lai M.D.   On: 02/01/2023 12:43    ASSESSMENT & PLAN:   Cancer of lower lobe of left lung (HCC) # STAGE IV- [JUNE 2022] Left lower lobe lung cancer-adenocarcinoma the lung;  EGFR- 19 del POSITIVE. s /p second opinion at [facility]- Dr.Stinchcomb. status post 4 cycles of carbo  Taxol-Atezo+Bev- JAN 25th, 2024-PET- Interval response to therapy with resolution of previously demonstrated extensive hypermetabolic activity throughout the liver.   # Currently on maintenance Atezo+Bev- PET scan April 17th- Unchanged appearance of chronic loculated left pleural effusion/fibrothorax with new hypermetabolic activity of the pleura and adjacent consolidations, findings may be due to pseudoprogression or progressive disease. New irregular areas of hypermetabolic activity in the liver associated with previously described metastatic liver lesions, findings may be due to pseudoprogression or  progressive disease. April 21st- liver MRI-  showed positive response to therapy when compared to prior abdominal MRI in terms of regression of diffuse hepatic metastatic disease.  Much of these imaging findings may be attributable to developing areas of hepatic fibrosis at the site of treated lesions, however, given the hypermetabolism in these regions on the recent PET-CT, some degree of residual tumor is not excluded. No new lesions are identified. Continued attention on follow-up studies is recommended.  # HOLD maintenance- Cycle #5  -Atezo + bev every 3 weeks today-given patient's ongoing extreme unexplained fatigue.  Today. Labs today reviewed;   # Unexplained severe fatigue-question endocrinopathy from immunotherapy.  Check thyroid panel testosterone cortisol level; ACTH.  Also discussed regarding considering MRI brain and extreme fatigue to rule out any central pituitary inflammation.  Discussed with Dr. Basilio Cairo, rad onc.   # Nose bleed: sec to Avastin/dry weather- on blowing- continue- Vaseline prn. Stable.   #Asymptomatic multiple brain mets subcentimeter asymptomatic- Pre-SBRT MRI brain AUG 18th, 2023- Eighteen small enhancing brain metastases (punctate to 11 mm)-s/p SBRT SEP 2023- [[place]].  FEB 12th, 2024- Numerous new punctate foci of enhancement in the bilateral cerebral and cerebellar  hemispheres, as detailed above and concerning for progressive metastatic disease. S/p SBRT [[place]]- FEB 12th, 2024.  Consider repeating the scan sooner given the extreme fatigue.  Recommend dexamethasone 6 mg after the labs are drawn.  #Elevated LFTs: AST ALT 50-60s range; elevated alkaline phosphatase normal bilirubin-likely secondary progressive disease in the liver. APRIL 2024 PET /MRI liver- . Diffuse hepatic metastatic disease with numerous lesions throughout both lobes of the liver--overall improvement.    # Elevated BP- sec to Avastin-  monitor or now- stable  # bone metastases-Hypocalcemia: mild- continue calcium 1200 plus vitamin D-3 1000-over-the-counter-1 pill a day. Stable; vit D 25-OH-pending.   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 On Eliquis-5 mg BID. stable.  #Bone metastases- CT DEc 7th, 2022 to healing process.  Dental evaluation on 9/21.  Continue calcium vitamin D intake. calcium 8.4- stable.   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  # left cheek papule-evaluation with dermatology.  Referral made.  # IV access/ port flush.   # DISPOSITION:  # labs to be drawn NOW- ordered. # Dex 6 mg IV today- ONLY.  # HOLD Atezo+Bev; HOLD zometa # Follow up in 2  weeks- MD; labs- cbc/cmp; CEA; UA; Atezo+Bev;  possible Zometa;  Dr.B   All questions were answered. The patient knows to call the clinic with any problems, questions or concerns.    [name], MD 02/05/2023 10:03 AM

## 2023-02-06 ENCOUNTER — Telehealth: Payer: Self-pay | Admitting: Internal Medicine

## 2023-02-06 LAB — THYROID PANEL WITH TSH
Free Thyroxine Index: 2 (ref 1.2–4.9)
T3 Uptake Ratio: 24 % (ref 24–39)
T4, Total: 8.5 ug/dL (ref 4.5–12.0)
TSH: 1.43 u[IU]/mL (ref 0.450–4.500)

## 2023-02-06 LAB — ACTH: C206 ACTH: 40.2 pg/mL (ref 7.2–63.3)

## 2023-02-06 LAB — CEA: CEA: 12.9 ng/mL — ABNORMAL HIGH (ref 0.0–4.7)

## 2023-02-06 LAB — TESTOSTERONE: Testosterone: 731 ng/dL (ref 264–916)

## 2023-02-06 MED ORDER — ERGOCALCIFEROL 1.25 MG (50000 UT) PO CAPS: 50000.0000 [IU] | ORAL_CAPSULE | ORAL | 1 refills | Status: DC

## 2023-02-06 MED ORDER — PREDNISONE 20 MG PO TABS: ORAL_TABLET | ORAL | 0 refills | Status: DC

## 2023-02-06 NOTE — Telephone Encounter (Signed)
LVM- for pt; unable to reach.  Discussed with pt's wife re: unremarkable results of the blood work.   Recommend trial of prednisone x 2 weeks.  Await MRI brain as pending on 5/06.  Vit D- 26; add D-50K/weekly- script sent.   Keep appt as planned.   GB  FYI- michelle.

## 2023-02-07 NOTE — Progress Notes (Signed)
Mr. [name] presents today for follow up after completing radiation therapy for brain metastases, and to review MRI results from 02-18-23. His last SRS treatment was on 12-25-22.   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 He has missed work a few times due to feeling poorly which is unusual for him. Saw his medical oncologist yesterday and decided not to receive his scheduled cycle of chemo given his symptoms.

## 2023-02-18 ENCOUNTER — Ambulatory Visit
Admission: RE | Admit: 2023-02-18 | Discharge: 2023-02-18 | Disposition: A | Discharge status: Home / Self Care | Payer: Managed Care, Other (non HMO) | Source: Ambulatory Visit | Attending: Radiation Oncology | Admitting: Radiation Oncology

## 2023-02-18 ENCOUNTER — Encounter: Payer: Self-pay | Admitting: Internal Medicine

## 2023-02-18 ENCOUNTER — Encounter: Payer: Self-pay | Admitting: Nurse Practitioner

## 2023-02-18 DIAGNOSIS — C7931 Secondary malignant neoplasm of brain: Secondary | ICD-10-CM

## 2023-02-18 MED ORDER — GADOPICLENOL 0.5 MMOL/ML IV SOLN
10.0000 mL | Freq: Once | INTRAVENOUS | Status: AC | PRN
  Administered 2023-02-18: 10 mL via INTRAVENOUS

## 2023-02-19 ENCOUNTER — Other Ambulatory Visit: Payer: Self-pay | Admitting: Internal Medicine

## 2023-02-19 ENCOUNTER — Inpatient Hospital Stay: Payer: Managed Care, Other (non HMO) | Attending: Internal Medicine | Admitting: Internal Medicine

## 2023-02-19 ENCOUNTER — Inpatient Hospital Stay: Payer: Managed Care, Other (non HMO)

## 2023-02-19 ENCOUNTER — Encounter: Payer: Self-pay | Admitting: Internal Medicine

## 2023-02-19 VITALS — BP 132/83 | HR 58 | Temp 96.2°F | Ht 71.0 in | Wt 227.0 lb

## 2023-02-19 DIAGNOSIS — C7951 Secondary malignant neoplasm of bone: Secondary | ICD-10-CM | POA: Insufficient documentation

## 2023-02-19 DIAGNOSIS — Z7901 Long term (current) use of anticoagulants: Secondary | ICD-10-CM | POA: Diagnosis not present

## 2023-02-19 DIAGNOSIS — J90 Pleural effusion, not elsewhere classified: Secondary | ICD-10-CM | POA: Diagnosis not present

## 2023-02-19 DIAGNOSIS — C7931 Secondary malignant neoplasm of brain: Secondary | ICD-10-CM | POA: Insufficient documentation

## 2023-02-19 DIAGNOSIS — C3432 Malignant neoplasm of lower lobe, left bronchus or lung: Secondary | ICD-10-CM

## 2023-02-19 DIAGNOSIS — Z5111 Encounter for antineoplastic chemotherapy: Secondary | ICD-10-CM | POA: Insufficient documentation

## 2023-02-19 DIAGNOSIS — I471 Supraventricular tachycardia, unspecified: Secondary | ICD-10-CM | POA: Diagnosis not present

## 2023-02-19 DIAGNOSIS — Z5112 Encounter for antineoplastic immunotherapy: Secondary | ICD-10-CM | POA: Insufficient documentation

## 2023-02-19 DIAGNOSIS — Z452 Encounter for adjustment and management of vascular access device: Secondary | ICD-10-CM | POA: Insufficient documentation

## 2023-02-19 DIAGNOSIS — Z86718 Personal history of other venous thrombosis and embolism: Secondary | ICD-10-CM | POA: Diagnosis not present

## 2023-02-19 DIAGNOSIS — R7989 Other specified abnormal findings of blood chemistry: Secondary | ICD-10-CM | POA: Diagnosis not present

## 2023-02-19 DIAGNOSIS — C787 Secondary malignant neoplasm of liver and intrahepatic bile duct: Secondary | ICD-10-CM | POA: Diagnosis not present

## 2023-02-19 LAB — CBC WITH DIFFERENTIAL/PLATELET
Abs Immature Granulocytes: 0.38 10*3/uL — ABNORMAL HIGH (ref 0.00–0.07)
Basophils Absolute: 0.1 10*3/uL (ref 0.0–0.1)
Basophils Relative: 1 %
Eosinophils Absolute: 0.2 10*3/uL (ref 0.0–0.5)
Eosinophils Relative: 1 %
HCT: 46.5 % (ref 39.0–52.0)
Hemoglobin: 15.4 g/dL (ref 13.0–17.0)
Immature Granulocytes: 3 %
Lymphocytes Relative: 11 %
Lymphs Abs: 1.5 10*3/uL (ref 0.7–4.0)
MCH: 30.7 pg (ref 26.0–34.0)
MCHC: 33.1 g/dL (ref 30.0–36.0)
MCV: 92.8 fL (ref 80.0–100.0)
Monocytes Absolute: 0.9 10*3/uL (ref 0.1–1.0)
Monocytes Relative: 6 %
Neutro Abs: 11.6 10*3/uL — ABNORMAL HIGH (ref 1.7–7.7)
Neutrophils Relative %: 78 %
Platelets: 95 10*3/uL — ABNORMAL LOW (ref 150–400)
RBC: 5.01 MIL/uL (ref 4.22–5.81)
RDW: 13.9 % (ref 11.5–15.5)
WBC: 14.7 10*3/uL — ABNORMAL HIGH (ref 4.0–10.5)
nRBC: 0 % (ref 0.0–0.2)

## 2023-02-19 LAB — URINALYSIS, DIPSTICK ONLY
Bilirubin Urine: NEGATIVE
Glucose, UA: NEGATIVE mg/dL
Hgb urine dipstick: NEGATIVE
Ketones, ur: NEGATIVE mg/dL
Leukocytes,Ua: NEGATIVE
Nitrite: NEGATIVE
Protein, ur: NEGATIVE mg/dL
Specific Gravity, Urine: 1.025 (ref 1.005–1.030)
pH: 5 (ref 5.0–8.0)

## 2023-02-19 LAB — COMPREHENSIVE METABOLIC PANEL
ALT: 147 U/L — ABNORMAL HIGH (ref 0–44)
AST: 77 U/L — ABNORMAL HIGH (ref 15–41)
Albumin: 3.2 g/dL — ABNORMAL LOW (ref 3.5–5.0)
Alkaline Phosphatase: 112 U/L (ref 38–126)
Anion gap: 8 (ref 5–15)
BUN: 13 mg/dL (ref 6–20)
CO2: 25 mmol/L (ref 22–32)
Calcium: 8.4 mg/dL — ABNORMAL LOW (ref 8.9–10.3)
Chloride: 105 mmol/L (ref 98–111)
Creatinine, Ser: 0.83 mg/dL (ref 0.61–1.24)
GFR, Estimated: >60 mL/min (ref >60)
Glucose, Bld: 112 mg/dL — ABNORMAL HIGH (ref 70–99)
Potassium: 3.6 mmol/L (ref 3.5–5.1)
Sodium: 138 mmol/L (ref 135–145)
Total Bilirubin: 0.5 mg/dL (ref 0.3–1.2)
Total Protein: 6.1 g/dL — ABNORMAL LOW (ref 6.5–8.1)

## 2023-02-19 MED ORDER — PREDNISONE 20 MG PO TABS: 20.0000 mg | ORAL_TABLET | Freq: Every day | ORAL | 0 refills | Status: DC

## 2023-02-19 MED ORDER — PREDNISONE 20 MG PO TABS: ORAL_TABLET | ORAL | 0 refills | Status: DC

## 2023-02-19 MED ORDER — HEPARIN SOD (PORK) LOCK FLUSH 100 UNIT/ML IV SOLN
500.0000 [IU] | Freq: Once | INTRAVENOUS | Status: AC
  Administered 2023-02-19: 500 [IU] via INTRAVENOUS
  Filled 2023-02-19: qty 5

## 2023-02-19 MED ORDER — SODIUM CHLORIDE 0.9% FLUSH
10.0000 mL | INTRAVENOUS | Status: DC | PRN
  Administered 2023-02-19: 10 mL via INTRAVENOUS
  Filled 2023-02-19: qty 10

## 2023-02-19 NOTE — Progress Notes (Signed)
C/o fatigue more than usual.  Dr. Letta Kocher had mri brain.

## 2023-02-19 NOTE — Assessment & Plan Note (Addendum)
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 s /p second opinion at [facility]- Dr.Stinchcomb. status post 4 cycles of carbo Taxol-Atezo+Bev- JAN 25th, 2024-PET- Interval response to therapy with resolution of previously demonstrated extensive hypermetabolic activity throughout the liver.   # Currently on maintenance Atezo+Bev- PET scan April 17th- Unchanged appearance of chronic loculated left pleural effusion/fibrothorax with new hypermetabolic activity of the pleura and adjacent consolidations, findings may be due to pseudoprogression or progressive disease. New irregular areas of hypermetabolic activity in the liver associated with previously described metastatic liver lesions, findings may be due to pseudoprogression or progressive disease. April 21st- liver MRI-  showed positive response to therapy when compared to prior abdominal MRI in terms of regression of diffuse hepatic metastatic disease.  Much of these imaging findings may be attributable to developing areas of hepatic fibrosis at the site of treated lesions, however, given the hypermetabolism in these regions on the recent PET-CT, some degree of residual tumor is not excluded. No new lesions are identified.  However clinically concerned about progression- see below.  Will discuss at tumor conference.  # HOLD maintenance- Cycle #5  -Atezo + bev every 3 weeks today-Today. Labs today reviewed;-question progression of disease versus side effects/toxicity.-Will start patient on prednisone 1 mg/kg 1 week; 60 mg prednisone for 1 week not to stop until next visit in 2 weeks.   # Unexplained severe fatigue-question endocrinopathy from immunotherapy.  Also discussed regarding considering MRI brain and extreme fatigue to rule out any central pituitary inflammation.  Discussed with Dr. Basilio Cairo, rad onc. Endocrine work up negative- [cortisol; TSH; testosterone- wnl]; vit D low- started on ergocalciferol-see  above.  #Asymptomatic multiple brain mets subcentimeter asymptomatic- Pre-SBRT MRI brain AUG 18th, 2023- Eighteen small enhancing brain metastases (punctate to 11 mm)-s/p SBRT SEP 2023- [[place]].  FEB 12th, 2024- Numerous new punctate foci of enhancement in the bilateral cerebral and cerebellar hemispheres, as detailed above and concerning for progressive metastatic disease. S/p SBRT [[place]]- FEB 12th, 2024.  Awaiting repeat MRI scan on 02/18/2023-  Discussed with Dr. Basilio Cairo.  #Elevated LFTs: AST ALT 50-60s range; elevated alkaline phosphatase normal bilirubin-likely secondary progressive disease in the liver. APRIL 2024 PET /MRI liver- . Diffuse hepatic metastatic disease with numerous lesions throughout both lobes of the liver--question progression versus hepatitis from [employer].  Slightly worse started on prednisone-see above.  Will review at the conference.   # Elevated BP- sec to Avastin-  monitor or now- stable  # bone metastases-Hypocalcemia: mild- continue calcium 1200 plus vitamin D-3 1000-over-the-counter-1 pill a day.  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 On Eliquis-5 mg BID. stable.  #Bone metastases- CT DEc 7th, 2022 to healing process.  Dental evaluation on 9/21.  Continue calcium vitamin D intake. calcium 8.4-  stable.  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  # IV access/ port flush.   # DISPOSITION:  # HOLD Atezo+Bev; HOLD zometa; de-access  # Follow up in 2  weeks- MD; labs- cbc/cmp; CEA; UA; Atezo+Bev;  possible Zometa;  Dr.B

## 2023-02-19 NOTE — Progress Notes (Signed)
[facility] CONSULT NOTE  Patient Care Team: Pcp, No as PCP - General [name], RN as Oncology Nurse Navigator [name], MD as Consulting Physician (Internal Medicine)  CHIEF COMPLAINTS/PURPOSE OF CONSULTATION: Lung cancer  #  Oncology History Overview Note  IMPRESSION: 1. Patchy nodular fat stranding throughout the anterior left upper quadrant peritoneal fat, nonspecific, cannot exclude peritoneal carcinomatosis. Dedicated CT abdomen/pelvis with oral and IV contrast recommended for further evaluation. 2. Dense patchy consolidation replacing much of the left lower lung lobe, appearing masslike in the superior segment left lower lobe, with associated bulging of the left major fissure and associated left lower lobe volume loss. Fine nodularity throughout both lungs with an upper lobe predominance. Asymmetric left upper lobe interlobular septal thickening. These findings are indeterminate, with differential including multilobar pneumonia, sarcoidosis or a neoplastic process. The persistence on radiographs back to 01/20/2021 despite antibiotic therapy make sarcoidosis or a neoplastic process more likely. Pulmonology consultation suggested for consideration of bronchoscopic evaluation. 3. Small dependent left pleural effusion. 4. Mild mediastinal lymphadenopathy, nonspecific. 5. Subacute healing lateral right sixth rib fracture.   DIAGNOSIS:  A. LUNG, LEFT LOWER LOBE; ENB-ASSISTED BIOPSY:  - NON-SMALL CELL CARCINOMA, FAVOR ADENOCARCINOMA.  - FOREIGN MATERIAL SUGGESTIVE OF ASPIRATION.   # EGFR MUTATED: 63 del- June 28th, 2022- [name]. [limited]  # Asymptomatic multiple brain mets subcentimeter asymptomatic -JUNE 29th, 2023- Numerous metastatic lesions in the supratentorial brain-increase in number also conspicuity compared to January 2023.  Pre-SBRT MRI brain AUG 18th, 2023-  Eighteen small enhancing brain metastases (punctate to 11 mm); were all  present on 03/28/2021-s/p SBRT SEP 2023- [[place]]; SRS/ radiation treatment for brain metastasis He finished treatment on 11-26-22.   # SEP-OCT-worsening left-sided pleural effusion-status post paracentesis; exudative; Cytology negative- ?  Progressive disease.  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  While on osimertinib- NOV 1st, 2023- PET scan- shows progressive disease in liver. Liquid Biopsy/Guardant testing-positive for EGFR mutation; otherwise Negative for any novel mutations.  Repeat liver Biopsy positive for adenocarcinoma. NEGATIVE for MET amplification.  S/p second opinion at [facility]- Dr.Stinchcomb. status post 4 cycles of carbo Taxol-Atezo+Bev- JAN 25th, 2024-PET- Interval response to therapy with resolution of previously demonstrated extensive hypermetabolic activity throughout the liver.    Cancer of lower lobe of left lung (HCC)  03/23/2021 Initial Diagnosis   Cancer of lower lobe of left lung (HCC)   03/29/2021 Cancer Staging   Staging form: Lung, AJCC 8th Edition - Clinical: Stage IVB (cT3, cN3, pM1c) - Signed by [name], MD on 03/29/2021   03/30/2021 - 03/30/2021 Chemotherapy   Patient is on Treatment Plan : LUNG NSCLC Pemetrexed (Alimta) / Carboplatin q21d x 1 cycles     08/17/2022 -  Chemotherapy   Patient is on Treatment Plan : LUNG Atezolizumab + Bevacizumab + Carboplatin + Paclitaxel q21d Induction x 4 cycles / Atezolizumab + Bevacizumab q21d Maintenance      HISTORY OF PRESENTING ILLNESS: Ambulating independently.  Alone.   MICO KNEIFL 42 y.o.  male patient with stage IV lung cancer adenocarcinoma-brain mets [synchronous] bone mets EGFR mutated -currently on maintenance Atezo-Bev is here for follow-up.  Patient continues to feel fatigued.  Overall feels poorly.  Status post prednisone-no significant improvement.  In the interim patient had an MRI brain with  radiation oncology-results.  Noticed to have no severe abdominal discomfort-however noted to have flatulence;  C/o abdominal bloating.  Denies any worsening headaches. No worsening pain. Intermittent nosebleeds- none currently.   Denies any worsening pain.  No worsening skin rash.   Review of Systems  Constitutional:  Positive for malaise/fatigue. Negative for chills, diaphoresis and fever.  HENT:  Negative for nosebleeds and sore throat.   Eyes:  Negative for double vision.  Respiratory:  Positive for cough. Negative for wheezing.   Cardiovascular:  Negative for chest pain, palpitations, orthopnea and leg swelling.  Gastrointestinal:  Positive for heartburn and nausea. Negative for abdominal pain, blood in stool, diarrhea and melena.  Genitourinary:  Negative for dysuria, frequency and urgency.  Musculoskeletal:  Negative for back pain and joint pain.  Skin:  Negative for itching.  Neurological:  Negative for tingling and focal weakness.  Endo/Heme/Allergies:  Does not bruise/bleed easily.  Psychiatric/Behavioral:  Negative for depression. The patient is not nervous/anxious and does not have insomnia.      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    SURGICAL HISTORY: Past Surgical History:  Procedure Laterality Date   FRACTURE SURGERY     broke femur when he was 6 years   PORTA CATH INSERTION N/A 03/28/2021   Procedure: PORTA CATH INSERTION;  Surgeon: Renford Dills, MD;  Location: [facility] INVASIVE CV LAB;  Service: Cardiovascular;  Laterality: N/A;   VIDEO BRONCHOSCOPY WITH ENDOBRONCHIAL NAVIGATION N/A 03/15/2021   Procedure: VIDEO BRONCHOSCOPY WITH ENDOBRONCHIAL NAVIGATION;  Surgeon: Vida Rigger, MD;  Location: [facility] ORS;  Service: Thoracic;  Laterality: N/A;   VIDEO BRONCHOSCOPY WITH ENDOBRONCHIAL ULTRASOUND N/A 03/15/2021   Procedure: VIDEO  BRONCHOSCOPY WITH ENDOBRONCHIAL ULTRASOUND;  Surgeon: Vida Rigger, MD;  Location: [facility] ORS;  Service: Thoracic;  Laterality: N/A;    SOCIAL HISTORY: Social History   Socioeconomic History   Marital status: Married    Spouse name: Darl Pikes   Number of children: 2   Years of education: Not on file   Highest education level: Not on file  Occupational History   Not on file  Tobacco Use   Smoking status: Never   Smokeless tobacco: Former    Types: Snuff, Chew  Vaping Use   Vaping Use: Never used  Substance and Sexual Activity   Alcohol use: Never   Drug use: Never   Sexual activity: Yes  Other Topics Concern   Not on file  Social History Narrative   [place]; with wife; 2 daughters[12 and 22]; never smoked; rare alcohol. 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 Dog and cat.   Social Determinants of Health   Financial Resource Strain: Low Risk  (05/10/2022)   Overall Financial Resource Strain (CARDIA)    Difficulty of Paying Living Expenses: Not hard at all  Food Insecurity: No Food Insecurity (05/10/2022)   Hunger Vital Sign    Worried About Running Out of Food in the Last Year: Never true    Ran Out of Food in the Last Year: Never true  Transportation Needs: No Transportation Needs (03/16/2022)   PRAPARE - Administrator, Civil Service (Medical): No    Lack of Transportation (Non-Medical): No  Physical Activity: Not on file  Stress: Not on file  Social Connections: Socially Integrated (05/10/2022)   Social Connection and Isolation Panel [NHANES]    Frequency of Communication with Friends and Family: More than three times a week    Frequency  of Social Gatherings with Friends and Family: More than three times a week    Attends Religious Services: More than 4 times per year    Active Member of [employer] or Organizations: Yes    Attends Engineer, structural: More than 4 times per year    Marital Status: Married  Catering manager Violence: Not on file    FAMILY  HISTORY: Family History  Problem Relation Age of Onset   High Cholesterol Mother    Hypertension Father    Lung cancer Father    Skin cancer Father     ALLERGIES:  is allergic to codeine.  MEDICATIONS:  Current Outpatient Medications  Medication Sig Dispense Refill   apixaban (ELIQUIS) 5 MG TABS tablet Take 1 tablet (5 mg total) by mouth 2 (two) times daily. 60 tablet 2   calcium carbonate (TUMS EX) 750 MG chewable tablet Chew 1 tablet by mouth daily.     ergocalciferol (VITAMIN D2) 1.25 MG (50000 UT) capsule Take 1 capsule (50,000 Units total) by mouth once a week. 12 capsule 1   folic acid (FOLVITE) 1 MG tablet TAKE 1 TABLET BY MOUTH EVERY DAY 90 tablet 1   lidocaine-prilocaine (EMLA) cream Apply 1 Application topically as needed. 30 g 0   metoprolol succinate (TOPROL-XL) 50 MG 24 hr tablet Take 50 mg by mouth daily. Take with or immediately following a meal.     predniSONE (DELTASONE) 20 MG tablet Take 1 tablet (20 mg total) by mouth daily with breakfast. Take 5 pills once a day for 1 week; then 43 pills once a day for 1 week-do not stop until instructed. Take once a day with food- in AM 80 tablet 0   sertraline (ZOLOFT) 50 MG tablet sertraline (ZOLOFT) 50 MG tablet TAKE 1/2 TABLET BY MOUTH DAILY 30 tablet 1   No current facility-administered medications for this visit.   Facility-Administered Medications Ordered in Other Visits  Medication Dose Route Frequency Provider Last Rate Last Admin   heparin lock flush 100 UNIT/ML injection            heparin lock flush 100 UNIT/ML injection            sodium chloride flush (NS) 0.9 % injection 10 mL  10 mL Intravenous PRN [name], MD   10 mL at 07/28/21 0852      .  PHYSICAL EXAMINATION: ECOG PERFORMANCE STATUS: 1 - Symptomatic but completely ambulatory  Vitals:   02/19/23 0811  BP: 132/83  Pulse: (!) 58  Temp: (!) 96.2 F (35.7 C)  SpO2: 96%       Filed Weights   02/19/23 0811  Weight: 227 lb (103 kg)       Left cheek papular lesion noted.  No signs of infection.  Physical Exam HENT:     Head: Normocephalic and atraumatic.     Mouth/Throat:     Pharynx: No oropharyngeal exudate.  Eyes:     Pupils: Pupils are equal, round, and reactive to light.  Cardiovascular:     Rate and Rhythm: Normal rate and regular rhythm.  Pulmonary:     Effort: No respiratory distress.     Breath sounds: No wheezing.     Comments: Decreased breath sound on the left side compared to right. Abdominal:     General: Bowel sounds are normal. There is no distension.     Palpations: Abdomen is soft. There is no mass.     Tenderness: There is no abdominal tenderness. There  is no guarding or rebound.  Musculoskeletal:        General: No tenderness. Normal range of motion.     Cervical back: Normal range of motion and neck supple.  Skin:    General: Skin is warm.  Neurological:     Mental Status: He is alert and oriented to person, place, and time.  Psychiatric:        Mood and Affect: Affect normal.    Fungal dermatitis bilateral buttocks groin/underneath abdominal pannus  LABORATORY DATA:  I have reviewed the data as listed Lab Results  Component Value Date   WBC 14.7 (H) 02/19/2023   HGB 15.4 02/19/2023   HCT 46.5 02/19/2023   MCV 92.8 02/19/2023   PLT 95 (L) 02/19/2023   Recent Labs    02/01/23 1253 02/05/23 0833 02/19/23 0812  NA 138 136 138  K 3.7 4.3 3.6  CL 107 104 105  CO2 24 26 25   GLUCOSE 106* 96 112*  BUN 10 11 13   CREATININE 0.86 0.94 0.83  CALCIUM 8.5* 8.9 8.4*  GFRNONAA >60 >60 >60  PROT 7.1 7.5 6.1*  ALBUMIN 3.8 3.7 3.2*  AST 62* 72* 77*  ALT 61* 72* 147*  ALKPHOS 127* 131* 112  BILITOT 0.6 0.6 0.5    RADIOGRAPHIC STUDIES: I have personally reviewed the radiological images as listed and agreed with the findings in the report. MR LIVER W WO CONTRAST  Result Date: 02/03/2023 CLINICAL DATA:  42 year old male with history of lung cancer with hypermetabolic liver  lesions noted on prior PET-CT suspicious for metastatic disease. EXAM: MRI ABDOMEN WITHOUT AND WITH CONTRAST TECHNIQUE: Multiplanar multisequence MR imaging of the abdomen was performed both before and after the administration of intravenous contrast. CONTRAST:  10mL GADAVIST GADOBUTROL 1 MMOL/ML IV SOLN COMPARISON:  Abdominal MRI 09/04/2022.  PET-CT 01/31/2023. FINDINGS: Lower chest: Large complex left pleural fluid collection partially imaged, with substantial internal debris. Hepatobiliary: Please note that the metastatic lesions in the liver seen on the prior MRI were most evident on the fat saturated T2 weighted sequence of the prior examination (series 6 of the prior study), and accordingly, direct comparison with the prior examination are made predominantly from the corresponding series 15 of today's study. The majority of the previously noted areas of T2 signal intensity throughout the right lobe of the liver on the prior examination have resolved, with a notable exception in segment 5 where there is a persistent mass-like area (axial image 19 of series 15) measuring 3.8 x 3.3 cm on today's study which is mildly T2 hyperintense, but very nondescript on post gadolinium imaging, but with some mild diffusion restriction. Notably, there is focal capsular contraction overlying this area. More widespread rather infiltrative appearing T2 hyperintensity is noted throughout the left lobe of the liver, also rather unremarkable on post gadolinium imaging, but demonstrating mild diffuse diffusion restriction. No definite new hepatic lesions are confidently identified. Pancreas: No pancreatic mass. No pancreatic ductal dilatation. No pancreatic or peripancreatic fluid collections or inflammatory changes. Spleen:  Unremarkable. Adrenals/Urinary Tract: Bilateral kidneys and bilateral adrenal glands are normal in appearance. No hydroureteronephrosis in the visualized portions of the abdomen. Stomach/Bowel: Visualized  portions are unremarkable. Vascular/Lymphatic: No aneurysm identified in the visualized abdominal vasculature. No lymphadenopathy noted in the abdomen. Other: No significant volume of ascites noted in the visualized portions of the peritoneal cavity. Musculoskeletal: No aggressive appearing osseous lesions are noted in the visualized portions of the skeleton. IMPRESSION: 1. Today's study demonstrates a positive response to  therapy when compared to prior abdominal MRI in terms of regression of diffuse hepatic metastatic disease. There are multiple residual areas of T2 signal abnormality which correspond to the previously recognized lesions, however, these appear less extensive, and are now very nondescript on post gadolinium imaging with some associated capsular retraction and diffusion restriction. Much of these imaging findings may be attributable to developing areas of hepatic fibrosis at the site of treated lesions, however, given the hypermetabolism in these regions on the recent PET-CT, some degree of residual tumor is not excluded. No new lesions are identified. Continued attention on follow-up studies is recommended. 2. Large complex left pleural effusion. Electronically Signed   By: Trudie Reed M.D.   On: 02/03/2023 11:00   NM PET Image Restage (PS) Skull Base to Thigh (F-18 FDG)  Result Date: 02/01/2023 CLINICAL DATA:  Subsequent treatment strategy for lung malignancy. EXAM: NUCLEAR MEDICINE PET SKULL BASE TO THIGH TECHNIQUE: 12.6 mCi F-18 FDG was injected intravenously. Full-ring PET imaging was performed from the skull base to thigh after the radiotracer. CT data was obtained and used for attenuation correction and anatomic localization. Fasting blood glucose: 92 mg/dl COMPARISON:  PET-CT dated November 08, 2022 FINDINGS: Mediastinal blood pool activity: SUV max 2.3 Liver activity: SUV max NA NECK: No hypermetabolic lymph nodes in the neck. Incidental CT findings: None. CHEST: New hypermetabolic  subcentimeter left axillary lymph node with SUV max of 3.1. 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 Subcentimeter left axillary lymph node measuring 6 mm in short axis on series 4, image 3, unchanged in size when compared with the prior exam with SUV max of 3.1. Incidental CT findings: Right chest wall port with tip in the right atrium. Small hiatal hernia. ABDOMEN/PELVIS: Increased irregular areas of FDG uptake associated with previously described metastatic liver lesions. Reference lesion of the right hepatic lobe with SUV max of 7.2 on series 610, image 88 not well visualized on associated noncontrast CT. No abnormal hypermetabolic activity within the, pancreas, adrenal glands, or spleen. No hypermetabolic lymph nodes in the abdomen or pelvis. Incidental CT findings: None. SKELETON: No focal hypermetabolic activity to suggest skeletal metastasis. Incidental CT findings: None. IMPRESSION: 1. 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 2. New irregular areas of hypermetabolic activity in the liver associated with previously described metastatic liver lesions, findings may be due to pseudoprogression or progressive disease. Contrast-enhanced liver MRI could be performed to better assess for tumor viability, alternatively short-term follow-up PET CT could be performed. 3. Subcentimeter left axillary lymph node new mild FDG uptake, possibly reactive, although progressive disease is a concern. Electronically Signed   By: Allegra Lai M.D.   On: 02/01/2023 12:43    ASSESSMENT & PLAN:   Cancer of lower lobe of left lung (HCC) # STAGE IV- [JUNE 2022] Left lower lobe lung  cancer-adenocarcinoma the lung;  EGFR- 19 del POSITIVE. s /p second opinion at [facility]- Dr.Stinchcomb. status post 4 cycles of carbo Taxol-Atezo+Bev- JAN 25th, 2024-PET- Interval response to therapy with resolution of previously demonstrated extensive hypermetabolic activity throughout the liver.   # Currently on maintenance Atezo+Bev- PET scan April 17th- Unchanged appearance of chronic loculated left pleural effusion/fibrothorax with new hypermetabolic activity of the pleura and adjacent consolidations, findings may be due  to pseudoprogression or progressive disease. New irregular areas of hypermetabolic activity in the liver associated with previously described metastatic liver lesions, findings may be due to pseudoprogression or progressive disease. April 21st- liver MRI-  showed positive response to therapy when compared to prior abdominal MRI in terms of regression of diffuse hepatic metastatic disease.  Much of these imaging findings may be attributable to developing areas of hepatic fibrosis at the site of treated lesions, however, given the hypermetabolism in these regions on the recent PET-CT, some degree of residual tumor is not excluded. No new lesions are identified.  However clinically concerned about progression- see below.  Will discuss at tumor conference.  # HOLD maintenance- Cycle #5  -Atezo + bev every 3 weeks today-Today. Labs today reviewed;-question progression of disease versus side effects/toxicity.-Will start patient on prednisone 1 mg/kg 1 week; 60 mg prednisone for 1 week not to stop until next visit in 2 weeks.   # Unexplained severe fatigue-question endocrinopathy from immunotherapy.  Also discussed regarding considering MRI brain and extreme fatigue to rule out any central pituitary inflammation.  Discussed with Dr. Basilio Cairo, rad onc. Endocrine work up negative- [cortisol; TSH; testosterone- wnl]; vit D low- started on ergocalciferol-see above.  #Asymptomatic multiple brain mets  subcentimeter asymptomatic- Pre-SBRT MRI brain AUG 18th, 2023- Eighteen small enhancing brain metastases (punctate to 11 mm)-s/p SBRT SEP 2023- [[place]].  FEB 12th, 2024- Numerous new punctate foci of enhancement in the bilateral cerebral and cerebellar hemispheres, as detailed above and concerning for progressive metastatic disease. S/p SBRT [[place]]- FEB 12th, 2024.  Awaiting repeat MRI scan on 02/18/2023-  Discussed with Dr. Basilio Cairo.  #Elevated LFTs: AST ALT 50-60s range; elevated alkaline phosphatase normal bilirubin-likely secondary progressive disease in the liver. APRIL 2024 PET /MRI liver- . Diffuse hepatic metastatic disease with numerous lesions throughout both lobes of the liver--question progression versus hepatitis from [employer].  Slightly worse started on prednisone-see above.  Will review at the conference.   # Elevated BP- sec to Avastin-  monitor or now- stable  # bone metastases-Hypocalcemia: mild- continue calcium 1200 plus vitamin D-3 1000-over-the-counter-1 pill a day.  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 On Eliquis-5 mg BID. stable.  #Bone metastases- CT DEc 7th, 2022 to healing process.  Dental evaluation on 9/21.  Continue calcium vitamin D intake. calcium 8.4-  stable.  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  # IV access/ port flush.   # DISPOSITION:  # HOLD Atezo+Bev; HOLD zometa; de-access  # Follow up in 2  weeks- MD; labs- cbc/cmp; CEA; UA; Atezo+Bev;  possible Zometa;  Dr.B   All questions were answered. The patient knows to call the clinic with any problems, questions or concerns.    [name], MD 02/19/2023 9:59 AM

## 2023-02-20 ENCOUNTER — Ambulatory Visit
Admission: RE | Admit: 2023-02-20 | Discharge: 2023-02-20 | Disposition: A | Discharge status: Home / Self Care | Payer: Managed Care, Other (non HMO) | Source: Ambulatory Visit | Attending: Radiation Oncology | Admitting: Radiation Oncology

## 2023-02-20 ENCOUNTER — Encounter: Payer: Self-pay | Admitting: Radiation Oncology

## 2023-02-20 ENCOUNTER — Other Ambulatory Visit: Payer: Self-pay

## 2023-02-20 ENCOUNTER — Encounter: Payer: Self-pay | Admitting: Internal Medicine

## 2023-02-20 ENCOUNTER — Telehealth: Payer: Self-pay | Admitting: Radiation Therapy

## 2023-02-20 VITALS — BP 130/92 | HR 73 | Temp 97.6°F | Resp 20 | Wt 231.2 lb

## 2023-02-20 DIAGNOSIS — Z7952 Long term (current) use of systemic steroids: Secondary | ICD-10-CM | POA: Insufficient documentation

## 2023-02-20 DIAGNOSIS — T451X5A Adverse effect of antineoplastic and immunosuppressive drugs, initial encounter: Secondary | ICD-10-CM | POA: Insufficient documentation

## 2023-02-20 DIAGNOSIS — Z923 Personal history of irradiation: Secondary | ICD-10-CM | POA: Insufficient documentation

## 2023-02-20 DIAGNOSIS — Z79899 Other long term (current) drug therapy: Secondary | ICD-10-CM | POA: Insufficient documentation

## 2023-02-20 DIAGNOSIS — R5383 Other fatigue: Secondary | ICD-10-CM | POA: Insufficient documentation

## 2023-02-20 DIAGNOSIS — C3432 Malignant neoplasm of lower lobe, left bronchus or lung: Secondary | ICD-10-CM | POA: Insufficient documentation

## 2023-02-20 DIAGNOSIS — C7931 Secondary malignant neoplasm of brain: Secondary | ICD-10-CM | POA: Insufficient documentation

## 2023-02-20 DIAGNOSIS — J90 Pleural effusion, not elsewhere classified: Secondary | ICD-10-CM | POA: Insufficient documentation

## 2023-02-20 DIAGNOSIS — K449 Diaphragmatic hernia without obstruction or gangrene: Secondary | ICD-10-CM | POA: Insufficient documentation

## 2023-02-20 DIAGNOSIS — Z7901 Long term (current) use of anticoagulants: Secondary | ICD-10-CM | POA: Insufficient documentation

## 2023-02-20 DIAGNOSIS — C787 Secondary malignant neoplasm of liver and intrahepatic bile duct: Secondary | ICD-10-CM | POA: Insufficient documentation

## 2023-02-20 DIAGNOSIS — G620 Drug-induced polyneuropathy: Secondary | ICD-10-CM | POA: Insufficient documentation

## 2023-02-20 LAB — VITAMIN B12: Vitamin B-12: 496 pg/mL (ref 180–914)

## 2023-02-20 NOTE — Telephone Encounter (Signed)
Per Dr. Colletta [place] request, I called to share that [name]'s recent B12 lab results were normal. I spoke with his wife, she was thankful for the call.   [name] R.T.(R)(T) Radiation Special Procedures Navigator

## 2023-02-20 NOTE — Progress Notes (Addendum)
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 Metastasis to brain [facility])  C79.31       Cancer Staging  Cancer of lower lobe of left lung (HCC) Staging form: Lung, AJCC 8th Edition - Clinical: Stage IVB (cT3, cN3, pM1c) - Signed by [name], MD on 03/29/2021   CHIEF COMPLAINT: Here for follow-up and surveillance of metastasis to the brain.   Narrative:  The patient returns today for routine follow-up.   [name] presents today for follow up after completing radiation therapy for brain metastases, and to review MRI results from 02-18-23. His last SRS treatment was on 12-25-22.    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 He has missed work a few times due to feeling poorly which is unusual for him. Saw his medical oncologist yesterday and decided not to receive his scheduled cycle of chemo given his symptoms.   ALLERGIES:  is allergic to codeine.  Meds: Current Outpatient Medications  Medication Sig Dispense Refill   apixaban (ELIQUIS) 5 MG TABS tablet Take 1 tablet  (5 mg total) by mouth 2 (two) times daily. 60 tablet 2   calcium carbonate (TUMS EX) 750 MG chewable tablet Chew 1 tablet by mouth daily.     ergocalciferol (VITAMIN D2) 1.25 MG (50000 UT) capsule Take 1 capsule (50,000 Units total) by mouth once a week. 12 capsule 1   folic acid (FOLVITE) 1 MG tablet TAKE 1 TABLET BY MOUTH EVERY DAY 90 tablet 1   lidocaine-prilocaine (EMLA) cream Apply 1 Application topically as needed. 30 g 0   metoprolol succinate (TOPROL-XL) 50 MG 24 hr tablet Take 50 mg by mouth daily. Take with or immediately following a meal.     predniSONE (DELTASONE) 20 MG tablet Take with breakfast daily. Take 5 pills every morning for 1 week . Then begin 3 pills every morning with breakfast until otherwise directed. Do not stop taking medication until directed by physician 80 tablet 0   sertraline (ZOLOFT) 50 MG tablet sertraline (ZOLOFT) 50 MG tablet TAKE 1/2 TABLET BY MOUTH DAILY 30 tablet 1   No current facility-administered medications for this encounter.   Facility-Administered Medications Ordered in Other Encounters  Medication Dose Route Frequency Provider Last Rate Last Admin   heparin lock flush 100 UNIT/ML injection            heparin lock flush 100 UNIT/ML injection            sodium chloride flush (NS) 0.9 % injection 10 mL  10 mL Intravenous PRN [name], MD   10 mL at 07/28/21 [phone]  Physical Findings: The patient is in no acute distress. Patient is alert and oriented.  weight is 231 lb 3.2 oz (104.9 kg). His temperature is 97.6 F (36.4 C). His blood pressure is 130/92 (abnormal) and his pulse is 73. His respiration is 20 and oxygen saturation is 96%. .   General: Alert and oriented, in no acute distress HEENT: Head is normocephalic. Extraocular movements are intact.     Musculoskeletal: Well-nourished and ambulatory Neurologic: Cranial nerves II through XII are grossly intact. No obvious focalities. Speech is fluent. Coordination is intact.    Psychiatric: Judgment and insight are intact. Affect is appropriate  KPS = 90  100 - Normal; no complaints; no evidence of disease. 90   - Able to carry on normal activity; minor signs or symptoms of disease. 80   - Normal activity with effort; some signs or symptoms of disease. 6   - Cares for self; unable to carry on normal activity or to do active work. 60   - Requires occasional assistance, but is able to care for most of his personal needs. 50   - Requires considerable assistance and frequent medical care. 40   - Disabled; requires special care and assistance. 30   - Severely disabled; hospital admission is indicated although death not imminent. 20   - Very sick; hospital admission necessary; active supportive treatment necessary. 10   - Moribund; fatal processes progressing rapidly. 0     - Dead  Karnofsky DA, Abelmann WH, Craver LS and Burchenal [facility] [phone]) The use of the nitrogen mustards in the palliative treatment of carcinoma: with particular reference to bronchogenic carcinoma Cancer 1 [phone]  Lab Findings: Lab Results  Component Value Date   WBC 14.7 (H) 02/19/2023   HGB 15.4 02/19/2023   HCT 46.5 02/19/2023   MCV 92.8 02/19/2023   PLT 95 (L) 02/19/2023   Radiographic Findings: MR LIVER W WO CONTRAST  Result Date: 02/03/2023 CLINICAL DATA:  42 year old male with history of lung cancer with hypermetabolic liver lesions noted on prior PET-CT suspicious for metastatic disease. EXAM: MRI ABDOMEN WITHOUT AND WITH CONTRAST TECHNIQUE: Multiplanar multisequence MR imaging of the abdomen was performed both before and after the administration of intravenous contrast. CONTRAST:  10mL GADAVIST GADOBUTROL 1 MMOL/ML IV SOLN COMPARISON:  Abdominal MRI 09/04/2022.  PET-CT 01/31/2023. FINDINGS: Lower chest: Large complex left pleural fluid collection partially imaged, with substantial internal debris. Hepatobiliary: Please note that the metastatic lesions in the liver seen on the prior MRI  were most evident on the fat saturated T2 weighted sequence of the prior examination (series 6 of the prior study), and accordingly, direct comparison with the prior examination are made predominantly from the corresponding series 15 of today's study. The majority of the previously noted areas of T2 signal intensity throughout the right lobe of the liver on the prior examination have resolved, with a notable exception in segment 5 where there is a persistent mass-like area (axial image 19 of series 15) measuring 3.8 x 3.3 cm on today's study which is mildly T2 hyperintense, but very nondescript on post gadolinium imaging, but with some mild diffusion restriction. Notably, there is focal capsular contraction overlying this area. More widespread rather infiltrative appearing T2 hyperintensity is noted throughout the left lobe of the liver, also rather unremarkable on post gadolinium imaging, but demonstrating mild diffuse diffusion restriction. No definite new hepatic lesions are confidently identified. Pancreas: No pancreatic mass. No pancreatic ductal dilatation. No pancreatic or peripancreatic fluid collections or inflammatory changes.  Spleen:  Unremarkable. Adrenals/Urinary Tract: Bilateral kidneys and bilateral adrenal glands are normal in appearance. No hydroureteronephrosis in the visualized portions of the abdomen. Stomach/Bowel: Visualized portions are unremarkable. Vascular/Lymphatic: No aneurysm identified in the visualized abdominal vasculature. No lymphadenopathy noted in the abdomen. Other: No significant volume of ascites noted in the visualized portions of the peritoneal cavity. Musculoskeletal: No aggressive appearing osseous lesions are noted in the visualized portions of the skeleton. IMPRESSION: 1. Today's study demonstrates a positive response to therapy when compared to prior abdominal MRI in terms of regression of diffuse hepatic metastatic disease. There are multiple residual areas of T2 signal  abnormality which correspond to the previously recognized lesions, however, these appear less extensive, and are now very nondescript on post gadolinium imaging with some associated capsular retraction and diffusion restriction. Much of these imaging findings may be attributable to developing areas of hepatic fibrosis at the site of treated lesions, however, given the hypermetabolism in these regions on the recent PET-CT, some degree of residual tumor is not excluded. No new lesions are identified. Continued attention on follow-up studies is recommended. 2. Large complex left pleural effusion. Electronically Signed   By: Trudie Reed M.D.   On: 02/03/2023 11:00   NM PET Image Restage (PS) Skull Base to Thigh (F-18 FDG)  Result Date: 02/01/2023 CLINICAL DATA:  Subsequent treatment strategy for lung malignancy. EXAM: NUCLEAR MEDICINE PET SKULL BASE TO THIGH TECHNIQUE: 12.6 mCi F-18 FDG was injected intravenously. Full-ring PET imaging was performed from the skull base to thigh after the radiotracer. CT data was obtained and used for attenuation correction and anatomic localization. Fasting blood glucose: 92 mg/dl COMPARISON:  PET-CT dated November 08, 2022 FINDINGS: Mediastinal blood pool activity: SUV max 2.3 Liver activity: SUV max NA NECK: No hypermetabolic lymph nodes in the neck. Incidental CT findings: None. CHEST: New hypermetabolic subcentimeter left axillary lymph node with SUV max of 3.1. 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 Subcentimeter left axillary lymph node measuring 6 mm in short axis on series 4, image 3, unchanged in size when compared with the prior exam with SUV max of 3.1. Incidental CT findings: Right chest wall port with tip in the right atrium. Small hiatal hernia. ABDOMEN/PELVIS: Increased irregular areas of  FDG uptake associated with previously described metastatic liver lesions. Reference lesion of the right hepatic lobe with SUV max of 7.2 on series 610, image 88 not well visualized on associated noncontrast CT. No abnormal hypermetabolic activity within the, pancreas, adrenal glands, or spleen. No hypermetabolic lymph nodes in the abdomen or pelvis. Incidental CT findings: None. SKELETON: No focal hypermetabolic activity to suggest skeletal metastasis. Incidental CT findings: None. IMPRESSION: 1. 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 2. New irregular areas of hypermetabolic activity in the liver associated with previously described metastatic liver lesions, findings may be due to pseudoprogression or progressive disease. Contrast-enhanced liver MRI could be performed to better assess for tumor viability, alternatively short-term follow-up PET CT could be performed. 3. Subcentimeter left axillary lymph node new mild FDG uptake, possibly reactive, although progressive disease is a concern. Electronically Signed   By: Allegra Lai M.D.   On: 02/01/2023 12:43    Impression/Plan:  42 yo male s/p 2 rounds of multifocal SRS therapy  for brain metastasis from stage IVB lung cancer.  His main complaint is extreme fatigue.  He has had partial control of his visceral disease but states he has not been on systemic therapy for several weeks  We will be presenting his MRI on Monday at tumor board.  I personally reviewed his images.  I do not think his extreme fatigue is due to his brain metastases or previous SRS effects.   He continues to have numerous punctate /subcentimeter brain metastases that are slowly growing and have yet to be treated... However, I am not incredibly enthusiastic about subjecting his brain to multiple radiosurgery targets  nor and I enthusiastic about giving him whole brain at this juncture. We will discuss Monday.   Regarding his extreme fatigue, he is hoping that Dr. Barbaraann Cao could see him for further discussion and additional workup.  Lab work so far has not explained this.  He is wondering about possible etiologies like Lyme disease, etc.... we will discuss at tumor board; patient would prefer workup at the cancer center rather than with PCP  He had endocrine lab work done and other labs including CBC and CMP which were unremarkable.  Today I checked vitamin B12 as he has a documented history of vitamin B12 deficiency but this was normal.  In case his fatigue is fed by depression I did suggest that he increase his dose of Zoloft as he is only taking half a tablet a day and he is going to verify that his medical oncologist concurs ... I am not convinced that his fatigue is only due to depression and still think he needs to have a complete workup regardless.  I asked Dr. Donneta Romberg if he has any other drugs that cross the blood-brain barrier and would still help his visceral disease.  Waiting to hear back from him  Next follow-up date: Pending tumor board discussion  On date of service, in total, I spent 45 minutes on this encounter. Patient was seen in person.  _____________________________________   Joyice Faster, PA   Lonie Peak, MD

## 2023-02-21 ENCOUNTER — Encounter: Payer: Self-pay | Admitting: Internal Medicine

## 2023-02-21 ENCOUNTER — Inpatient Hospital Stay: Payer: Managed Care, Other (non HMO) | Admitting: Hospice and Palliative Medicine

## 2023-02-21 ENCOUNTER — Other Ambulatory Visit: Payer: Managed Care, Other (non HMO)

## 2023-02-21 ENCOUNTER — Other Ambulatory Visit: Payer: Self-pay | Admitting: Internal Medicine

## 2023-02-21 ENCOUNTER — Telehealth: Payer: Self-pay | Admitting: Internal Medicine

## 2023-02-21 DIAGNOSIS — C3432 Malignant neoplasm of lower lobe, left bronchus or lung: Secondary | ICD-10-CM

## 2023-02-21 DIAGNOSIS — R5383 Other fatigue: Secondary | ICD-10-CM

## 2023-02-21 LAB — CEA: CEA: 15 ng/mL — ABNORMAL HIGH (ref 0.0–4.7)

## 2023-02-21 MED ORDER — SERTRALINE HCL 50 MG PO TABS: 50.0000 mg | ORAL_TABLET | Freq: Every day | ORAL | 1 refills | Status: DC

## 2023-02-21 NOTE — Progress Notes (Signed)
DISCONTINUE ON PATHWAY REGIMEN - Non-Small Cell Lung     A cycle is every 21 days:     Atezolizumab      Bevacizumab-xxxx      Paclitaxel      Carboplatin   **Always confirm dose/schedule in your pharmacy ordering system**  REASON: Toxicities / Adverse Event PRIOR TREATMENT: [id number]: Atezolizumab 1,200 mg + Bevacizumab 15 mg/kg + Paclitaxel 200 mg/m2 + Carboplatin AUC=6 q21 Days x 4 Cycles TREATMENT RESPONSE: Partial Response (PR)  START OFF PATHWAY REGIMEN - Non-Small Cell Lung   Custom Intervention:Medical: [Pemetrexed]:     Pemetrexed   **Always confirm dose/schedule in your pharmacy ordering system**  Patient Characteristics: Stage IV Metastatic, Nonsquamous, Molecular Analysis Completed, Molecular Alteration Present and Eligible for Molecular Targeted Therapy, Second Line - Molecular Targeted Therapy, EGFR Mutation - Common (Exon 19 Deletion or Exon 21 L858R Substitution),  T790M Negative/Unknown Therapeutic Status: Stage IV Metastatic Histology: Nonsquamous Cell Broad Molecular Profiling Status: Molecular Analysis Completed Molecular Analysis Results: Alteration Present and Eligible for Molecular Targeted Therapy Molecular Alteration Present: EGFR Mutation - Common (Exon 19 Deletion or Exon 21 L858R Substitution) Molecular Targeted Line of Therapy: Second Scientist, forensic Therapy T790M Mutation Status: Negative Following EGFR TKI Intent of Therapy: Non-Curative / Palliative Intent, Discussed with Patient

## 2023-02-21 NOTE — Telephone Encounter (Signed)
Reviewed imaging at the cancer conference-MRI PET scan showed continued partial response.  However worsening fatigue/rising CEA/rising LFTs-question related to immunotherapy versus progression of disease.  Currently on prednisone 100 mg a day; 1 mg/kg dose.  Await 1 more week for improvement of symptoms.  If progressive will switch over to alternate chemotherapy-Alimta single agent.  Discussed with Dr. Marjie Skiff await possible antibody drug conjugate options for the patient.  Will reschedule-patient visit to 1 week- next Thursday or Friday- Start alimta single agent.   Status post evaluation with [facility]-currently on Zoloft 50 mg a day.  Also discussed with [name]; Dr. Basilio Cairo.   I tried to reach the patient's wife Suzanne-unable to reach; left voicemail.   Thanks GB

## 2023-02-21 NOTE — Telephone Encounter (Signed)
Discussed with Dr. Eber Hong, regarding patient's current clinical condition.  Awaiting possible expanded drug access for patritumab deruxtecan ([id number])-at [facility] and not approved yet.  Also plan repeating guardant testing;

## 2023-02-21 NOTE — Progress Notes (Signed)
Unable to reach patient by phone.  Voicemail left.  I spoke with patient's wife.  Reportedly, patient is working.  Wife says the patient has had persistent fatigue.  He is still working a full-time job and we discussed that that might be too taxing physiologically.  Fatigue could be related to his overall cancer.  However, patient and wife are concerned that he is depressed.  He was previously on sertraline 25 mg daily, which patient had stopped but recently resumed.  Okay to increase dose to 50 mg daily.  Also discussed trial of American ginseng for cancer related fatigue

## 2023-02-21 NOTE — Progress Notes (Signed)
in 1 week;  MD labs CBC CMP CEA; UA.  port/ Alimta-bevacizumab ;  B12 injection same day.

## 2023-02-25 ENCOUNTER — Inpatient Hospital Stay: Payer: 59

## 2023-02-25 ENCOUNTER — Telehealth: Payer: Self-pay | Admitting: Radiation Therapy

## 2023-02-25 ENCOUNTER — Inpatient Hospital Stay: Payer: 59 | Attending: Radiation Oncology | Admitting: Internal Medicine

## 2023-02-25 ENCOUNTER — Other Ambulatory Visit: Payer: Self-pay

## 2023-02-25 ENCOUNTER — Other Ambulatory Visit: Payer: Self-pay | Admitting: Radiation Therapy

## 2023-02-25 VITALS — BP 142/89 | HR 63 | Temp 98.0°F | Resp 18 | Wt 229.6 lb

## 2023-02-25 DIAGNOSIS — Z7952 Long term (current) use of systemic steroids: Secondary | ICD-10-CM | POA: Insufficient documentation

## 2023-02-25 DIAGNOSIS — C787 Secondary malignant neoplasm of liver and intrahepatic bile duct: Secondary | ICD-10-CM | POA: Insufficient documentation

## 2023-02-25 DIAGNOSIS — C7931 Secondary malignant neoplasm of brain: Secondary | ICD-10-CM | POA: Diagnosis not present

## 2023-02-25 DIAGNOSIS — C3432 Malignant neoplasm of lower lobe, left bronchus or lung: Secondary | ICD-10-CM | POA: Diagnosis present

## 2023-02-25 NOTE — Telephone Encounter (Signed)
Called [name] to share that the tumor board was in consensus about no brain RT currently, and close follow-up. [name] is scheduled to see Dr. Barbaraann Cao  this afternoon, he will further discuss this with them. While we were on the phone she said that [name] is starting to feel a little better since Friday, 5/10.   [name] R.T.(R)(T) Radiation Special Procedures Navigator

## 2023-02-25 NOTE — Progress Notes (Signed)
[facility] [address]  [place], [place] [address] [phone]   Interval Evaluation  Date of Service: 02/25/23 Patient Name: [name] Patient MRN: [id number] Patient DOB: 03-16-1981 Provider: Henreitta Leber, MD  Identifying Statement:  [name] is a 42 y.o. male with Malignant neoplasm metastatic to brain [facility])   Primary Cancer:  Oncologic History: Oncology History Overview Note  IMPRESSION: 1. Patchy nodular fat stranding throughout the anterior left upper quadrant peritoneal fat, nonspecific, cannot exclude peritoneal carcinomatosis. Dedicated CT abdomen/pelvis with oral and IV contrast recommended for further evaluation. 2. Dense patchy consolidation replacing much of the left lower lung lobe, appearing masslike in the superior segment left lower lobe, with associated bulging of the left major fissure and associated left lower lobe volume loss. Fine nodularity throughout both lungs with an upper lobe predominance. Asymmetric left upper lobe interlobular septal thickening. These findings are indeterminate, with differential including multilobar pneumonia, sarcoidosis or a neoplastic process. The persistence on radiographs back to 01/20/2021 despite antibiotic therapy make sarcoidosis or a neoplastic process more likely. Pulmonology consultation suggested for consideration of bronchoscopic evaluation. 3. Small dependent left pleural effusion. 4. Mild mediastinal lymphadenopathy, nonspecific. 5. Subacute healing lateral right sixth rib fracture.   DIAGNOSIS:  A. LUNG, LEFT LOWER LOBE; ENB-ASSISTED BIOPSY:  - NON-SMALL CELL CARCINOMA, FAVOR ADENOCARCINOMA.  - FOREIGN MATERIAL SUGGESTIVE OF ASPIRATION.   # EGFR MUTATED: 24 del- June 28th, 2022- [name]. [limited]  # Asymptomatic multiple brain mets subcentimeter asymptomatic -JUNE 29th, 2023- Numerous metastatic lesions in the supratentorial brain-increase in  number also conspicuity compared to January 2023.  Pre-SBRT MRI brain AUG 18th, 2023-  Eighteen small enhancing brain metastases (punctate to 11 mm); were all present on 03/28/2021-s/p SBRT SEP 2023- [[place]]; SRS/ radiation treatment for brain metastasis He finished treatment on 11-26-22.   # SEP-OCT-worsening left-sided pleural effusion-status post paracentesis; exudative; Cytology negative- ?  Progressive disease.  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  While on osimertinib- NOV 1st, 2023- PET scan- shows progressive disease in liver. Liquid Biopsy/Guardant testing-positive for EGFR mutation; otherwise Negative for any novel mutations.  Repeat liver Biopsy positive for adenocarcinoma. NEGATIVE for MET amplification.  S/p second opinion at [facility]- Dr.Stinchcomb. status post 4 cycles of carbo Taxol-Atezo+Bev- JAN 25th, 2024-PET- Interval response to therapy with resolution of previously demonstrated extensive hypermetabolic activity throughout the liver.    Cancer of lower lobe of left lung (HCC)  03/23/2021 Initial Diagnosis   Cancer of lower lobe of left lung (HCC)   03/29/2021 Cancer Staging   Staging form: Lung, AJCC 8th Edition - Clinical: Stage IVB (cT3, cN3, pM1c) - Signed by [name], MD on 03/29/2021   03/30/2021 - 03/30/2021 Chemotherapy   Patient is on Treatment Plan : LUNG NSCLC Pemetrexed (Alimta) / Carboplatin q21d x 1 cycles     08/17/2022 - 01/15/2023 Chemotherapy   Patient is on Treatment Plan : LUNG Atezolizumab + Bevacizumab + Carboplatin + Paclitaxel q21d Induction x 4 cycles / Atezolizumab + Bevacizumab q21d Maintenance     02/28/2023 -  Chemotherapy   Patient is on Treatment Plan : LUNG NON-SMALL CELL Bevacizumab + Pemetrexed q21d      CNS Oncologic History 06/05/22: SRS x18 [facility]) 11/26/22: SRS x13 [facility])  Interval History: [name] presents today for follow  up, now having completed salvage radiosurgery and 3 month follow up  MRI scan.  He describes bout of severe fatigue, coming on suddenly and lasting "about 2 weeks" recently having resolved.  He has been dosing prednisone 100mg  daily without ill effect.  Last immunotherapy infusion was in early April, most recent rx having been held due to the fatigue symptoms.    H+P (05/03/22) Patient presents today to review recent finding of progressive brain metastases from lung cancer.  Mets were originally identified in June 2022, he was asymptomatic at that time.  Decision was made to monitor with serial imaging given EGFR status and utilization of tagrisso through Dr. Donneta Romberg.  Recent MRI demonstrates progression of disease in the brain, but he remains asymptomatic.    Medications: Current Outpatient Medications on File Prior to Visit  Medication Sig Dispense Refill   apixaban (ELIQUIS) 5 MG TABS tablet Take 1 tablet (5 mg total) by mouth 2 (two) times daily. 60 tablet 2   calcium carbonate (TUMS EX) 750 MG chewable tablet Chew 1 tablet by mouth daily.     ergocalciferol (VITAMIN D2) 1.25 MG (50000 UT) capsule Take 1 capsule (50,000 Units total) by mouth once a week. 12 capsule 1   folic acid (FOLVITE) 1 MG tablet TAKE 1 TABLET BY MOUTH EVERY DAY 90 tablet 1   lidocaine-prilocaine (EMLA) cream Apply 1 Application topically as needed. 30 g 0   metoprolol succinate (TOPROL-XL) 50 MG 24 hr tablet Take 50 mg by mouth daily. Take with or immediately following a meal.     predniSONE (DELTASONE) 20 MG tablet Take with breakfast daily. Take 5 pills every morning for 1 week . Then begin 3 pills every morning with breakfast until otherwise directed. Do not stop taking medication until directed by physician 80 tablet 0   sertraline (ZOLOFT) 50 MG tablet Take 1 tablet (50 mg total) by mouth daily. 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   Past Surgical History:  Past Surgical History:  Procedure Laterality Date   FRACTURE SURGERY     broke femur when he was 6 years   PORTA CATH INSERTION N/A 03/28/2021   Procedure: PORTA CATH INSERTION;  Surgeon: Renford Dills, MD;  Location: [facility] INVASIVE CV LAB;  Service: Cardiovascular;  Laterality: N/A;   VIDEO BRONCHOSCOPY WITH ENDOBRONCHIAL NAVIGATION N/A 03/15/2021   Procedure: VIDEO BRONCHOSCOPY WITH ENDOBRONCHIAL NAVIGATION;  Surgeon: Vida Rigger, MD;  Location: [facility] ORS;  Service: Thoracic;  Laterality: N/A;   VIDEO BRONCHOSCOPY WITH ENDOBRONCHIAL ULTRASOUND N/A 03/15/2021   Procedure: VIDEO BRONCHOSCOPY WITH ENDOBRONCHIAL ULTRASOUND;  Surgeon: Vida Rigger, MD;  Location: [facility] ORS;  Service: Thoracic;  Laterality: N/A;   Social History:  Social History   Socioeconomic History   Marital status: Married  Spouse name: Darl Pikes   Number of children: 2   Years of education: Not on file   Highest education level: Not on file  Occupational History   Not on file  Tobacco Use   Smoking status: Never   Smokeless tobacco: Former    Types: Snuff, Chew  Vaping Use   Vaping Use: Never used  Substance and Sexual Activity   Alcohol use: Never   Drug use: Never   Sexual activity: Yes  Other Topics Concern   Not on file  Social History Narrative   [place]; with wife; 2 daughters[12 and 22]; never smoked; rare alcohol. 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 Dog and cat.   Social  Determinants of Health   Financial Resource Strain: Low Risk  (05/10/2022)   Overall Financial Resource Strain (CARDIA)    Difficulty of Paying Living Expenses: Not hard at all  Food Insecurity: No Food Insecurity (05/10/2022)   Hunger Vital Sign    Worried About Running Out of Food in the Last Year: Never true    Ran Out of Food in the Last Year: Never true  Transportation Needs: No Transportation Needs (03/16/2022)   PRAPARE - Administrator, Civil Service (Medical): No    Lack of Transportation (Non-Medical): No  Physical Activity: Not on file  Stress: Not on file  Social Connections: Socially Integrated (05/10/2022)   Social Connection and Isolation Panel [NHANES]    Frequency of Communication with Friends and Family: More than three times a week    Frequency of Social Gatherings with Friends and Family: More than three times a week    Attends Religious Services: More than 4 times per year    Active Member of [employer] or Organizations: Yes    Attends Engineer, structural: More than 4 times per year    Marital Status: Married  Catering manager Violence: Not on file   Family History:  Family History  Problem Relation Age of Onset   High Cholesterol Mother    Hypertension Father    Lung cancer Father    Skin cancer Father     Review of Systems: Constitutional: Doesn't report fevers, chills or abnormal weight loss Eyes: Doesn't report blurriness of vision Ears, nose, mouth, throat, and face: Doesn't report sore throat Respiratory: Doesn't report cough, dyspnea or wheezes Cardiovascular: Doesn't report palpitation, chest discomfort  Gastrointestinal:  Doesn't report nausea, constipation, diarrhea GU: Doesn't report incontinence Skin: Doesn't report skin rashes Neurological: Per HPI Musculoskeletal: Doesn't report joint pain Behavioral/Psych: Doesn't report anxiety  Physical Exam: Vitals:   02/25/23 1414  BP: (!) 142/89  Pulse: 63  Resp: 18  Temp: 98 F  (36.7 C)  SpO2: 95%   KPS: 90. General: Alert, cooperative, pleasant, in no acute distress Head: Normal EENT: No conjunctival injection or scleral icterus.  Lungs: Resp effort normal Cardiac: Regular rate Abdomen: Non-distended abdomen Skin: No rashes cyanosis or petechiae. Extremities: No clubbing or edema  Neurologic Exam: Mental Status: Awake, alert, attentive to examiner. Oriented to self and environment. Language is fluent with intact comprehension.  Cranial Nerves: Visual acuity is grossly normal. Visual fields are full. Extra-ocular movements intact. No ptosis. Face is symmetric Motor: Tone and bulk are normal. Power is full in both arms and legs. Reflexes are symmetric, no pathologic reflexes present.  Sensory: Intact to light touch Gait: Normal.   Labs: I have reviewed the data as listed    Component Value Date/Time   NA 138 02/19/2023 [phone]  K 3.6 02/19/2023 0812   CL 105 02/19/2023 0812   CO2 25 02/19/2023 0812   GLUCOSE 112 (H) 02/19/2023 0812   BUN 13 02/19/2023 0812   CREATININE 0.83 02/19/2023 0812   CREATININE 0.86 02/01/2023 1253   CALCIUM 8.4 (L) 02/19/2023 0812   PROT 6.1 (L) 02/19/2023 0812   ALBUMIN 3.2 (L) 02/19/2023 0812   AST 77 (H) 02/19/2023 0812   AST 62 (H) 02/01/2023 1253   ALT 147 (H) 02/19/2023 0812   ALT 61 (H) 02/01/2023 1253   ALKPHOS 112 02/19/2023 0812   BILITOT 0.5 02/19/2023 0812   BILITOT 0.6 02/01/2023 1253   GFRNONAA >60 02/19/2023 0812   GFRNONAA >60 02/01/2023 1253   Lab Results  Component Value Date   WBC 14.7 (H) 02/19/2023   NEUTROABS 11.6 (H) 02/19/2023   HGB 15.4 02/19/2023   HCT 46.5 02/19/2023   MCV 92.8 02/19/2023   PLT 95 (L) 02/19/2023    Imaging:  MR Brain W Wo Contrast  Addendum Date: 02/25/2023   ADDENDUM REPORT: 02/25/2023 07:56 ADDENDUM: 12 mm hazy lesion at the medial right temporal lobe on 14:64, intervally stable but a nontreated metastatic focus as discussed in tumor board this morning.  Electronically Signed   By: Tiburcio Pea M.D.   On: 02/25/2023 07:56   Result Date: 02/25/2023 CLINICAL DATA:  Brain metastases, assess treatment response. Lung cancer. EXAM: MRI HEAD WITHOUT AND WITH CONTRAST TECHNIQUE: Multiplanar, multiecho pulse sequences of the brain and surrounding structures were obtained without and with intravenous contrast. CONTRAST:  10 mL of Vueway COMPARISON:  Head MRI 11/15/2022 FINDINGS: Brain: Image references are to series 13. New lesions: 1. 2 mm inferior left cerebellum (image 16). 2. 5 mm inferior left cerebellum (image 17). 3. 3 mm mid left cerebellum (image 32). 4. 2 mm cerebellar vermis (image 45). 5. 3 mm right occipital lobe (image 54). 6. 2 mm lateral right frontal lobe (image 110). 7. 3 mm medial right frontal lobe (image 111). 8. 4 mm medial right parietal lobe (image 114). 9. 3 mm posterolateral right parietal lobe (image 115). 10. 4 mm posterior left frontal lobe, located lateral to a previously treated lesion (image 116). 11. 3 mm posteromedial right frontal lobe (image 132). 12. 7 mm posteromedial right frontal lobe (image 133). 13. 3 mm left middle frontal gyrus (image 135). 14. 4 mm right precentral gyrus (image 142). Multiple other small lesions are either stable or smaller than on the prior study with some lesions no longer being visible. Stable or smaller visible lesions are annotated with single arrows on series 13. Scattered chronic blood products are again noted associated with multiple lesions. There is no significant associated edema. Minimal T2 hyperintensity is again noted associated with treated lesions in the left precentral gyrus. No acute infarct, midline shift, hydrocephalus, or extra-axial fluid collection is evident. Vascular: Major intracranial vascular flow voids are preserved. Skull and upper cervical spine: Unremarkable bone marrow signal. Sinuses/Orbits: Unremarkable orbits. Small mucous retention cyst in the left maxillary sinus. Clear  mastoid air cells. Other: None. IMPRESSION: 1. Fourteen new subcentimeter brain metastases. No associated edema. 2. Stable to decreased size of numerous other metastases. Electronically Signed: By: Sebastian Ache M.D. On: 02/20/2023 12:47   MR LIVER W WO CONTRAST  Result Date: 02/03/2023 CLINICAL DATA:  42 year old male with history of lung cancer with hypermetabolic liver lesions noted on prior PET-CT suspicious for metastatic disease. EXAM: MRI ABDOMEN WITHOUT AND WITH CONTRAST TECHNIQUE: Multiplanar multisequence MR imaging of the  abdomen was performed both before and after the administration of intravenous contrast. CONTRAST:  10mL GADAVIST GADOBUTROL 1 MMOL/ML IV SOLN COMPARISON:  Abdominal MRI 09/04/2022.  PET-CT 01/31/2023. FINDINGS: Lower chest: Large complex left pleural fluid collection partially imaged, with substantial internal debris. Hepatobiliary: Please note that the metastatic lesions in the liver seen on the prior MRI were most evident on the fat saturated T2 weighted sequence of the prior examination (series 6 of the prior study), and accordingly, direct comparison with the prior examination are made predominantly from the corresponding series 15 of today's study. The majority of the previously noted areas of T2 signal intensity throughout the right lobe of the liver on the prior examination have resolved, with a notable exception in segment 5 where there is a persistent mass-like area (axial image 19 of series 15) measuring 3.8 x 3.3 cm on today's study which is mildly T2 hyperintense, but very nondescript on post gadolinium imaging, but with some mild diffusion restriction. Notably, there is focal capsular contraction overlying this area. More widespread rather infiltrative appearing T2 hyperintensity is noted throughout the left lobe of the liver, also rather unremarkable on post gadolinium imaging, but demonstrating mild diffuse diffusion restriction. No definite new hepatic lesions are  confidently identified. Pancreas: No pancreatic mass. No pancreatic ductal dilatation. No pancreatic or peripancreatic fluid collections or inflammatory changes. Spleen:  Unremarkable. Adrenals/Urinary Tract: Bilateral kidneys and bilateral adrenal glands are normal in appearance. No hydroureteronephrosis in the visualized portions of the abdomen. Stomach/Bowel: Visualized portions are unremarkable. Vascular/Lymphatic: No aneurysm identified in the visualized abdominal vasculature. No lymphadenopathy noted in the abdomen. Other: No significant volume of ascites noted in the visualized portions of the peritoneal cavity. Musculoskeletal: No aggressive appearing osseous lesions are noted in the visualized portions of the skeleton. IMPRESSION: 1. Today's study demonstrates a positive response to therapy when compared to prior abdominal MRI in terms of regression of diffuse hepatic metastatic disease. There are multiple residual areas of T2 signal abnormality which correspond to the previously recognized lesions, however, these appear less extensive, and are now very nondescript on post gadolinium imaging with some associated capsular retraction and diffusion restriction. Much of these imaging findings may be attributable to developing areas of hepatic fibrosis at the site of treated lesions, however, given the hypermetabolism in these regions on the recent PET-CT, some degree of residual tumor is not excluded. No new lesions are identified. Continued attention on follow-up studies is recommended. 2. Large complex left pleural effusion. Electronically Signed   By: Trudie Reed M.D.   On: 02/03/2023 11:00   NM PET Image Restage (PS) Skull Base to Thigh (F-18 FDG)  Result Date: 02/01/2023 CLINICAL DATA:  Subsequent treatment strategy for lung malignancy. EXAM: NUCLEAR MEDICINE PET SKULL BASE TO THIGH TECHNIQUE: 12.6 mCi F-18 FDG was injected intravenously. Full-ring PET imaging was performed from the skull base to  thigh after the radiotracer. CT data was obtained and used for attenuation correction and anatomic localization. Fasting blood glucose: 92 mg/dl COMPARISON:  PET-CT dated November 08, 2022 FINDINGS: Mediastinal blood pool activity: SUV max 2.3 Liver activity: SUV max NA NECK: No hypermetabolic lymph nodes in the neck. Incidental CT findings: None. CHEST: New hypermetabolic subcentimeter left axillary lymph node with SUV max of 3.1. 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 Subcentimeter left axillary lymph node measuring 6 mm in short axis on  series 4, image 3, unchanged in size when compared with the prior exam with SUV max of 3.1. Incidental CT findings: Right chest wall port with tip in the right atrium. Small hiatal hernia. ABDOMEN/PELVIS: Increased irregular areas of FDG uptake associated with previously described metastatic liver lesions. Reference lesion of the right hepatic lobe with SUV max of 7.2 on series 610, image 88 not well visualized on associated noncontrast CT. No abnormal hypermetabolic activity within the, pancreas, adrenal glands, or spleen. No hypermetabolic lymph nodes in the abdomen or pelvis. Incidental CT findings: None. SKELETON: No focal hypermetabolic activity to suggest skeletal metastasis. Incidental CT findings: None. IMPRESSION: 1. 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 2. New irregular areas of hypermetabolic activity in the liver associated with previously described metastatic liver lesions, findings may be due to pseudoprogression or progressive disease. Contrast-enhanced liver MRI could be performed to better assess for tumor  viability, alternatively short-term follow-up PET CT could be performed. 3. Subcentimeter left axillary lymph node new mild FDG uptake, possibly reactive, although progressive disease is a concern. Electronically Signed   By: Allegra Lai M.D.   On: 02/01/2023 12:43    [facility] Clinician Interpretation: I have personally reviewed the radiological images as listed.  My interpretation, in the context of the patient's clinical presentation, is progressive disease   Assessment/Plan Malignant neoplasm metastatic to brain [facility])  HARVY MAGNANO is clinically stable today, but MRI brain demonstrates multiple punctate new lesions c/w tiny metastases from lung cancer.   His case was reviewed in detail in CNS tumor board this morning.    Recommendation was made to continue close surveillance.  He will likely need WBRT in the coming months, we want to avoid high dose SRS in the meantime to reduce risk of radio-inflammatory syndrome, especially with concurrent immunotherapy.  Fortunately his fatigue has improved, we recommended and counseled on gradual weaning of corticosteroids.  We appreciate the opportunity to participate in the care of [name].  We can follow up with him in 2 months at [facility] clinic.  All questions were answered. The patient knows to call the clinic with any problems, questions or concerns. No barriers to learning were detected.  The total time spent in the encounter was 40 minutes and more than 50% was on counseling and review of test results   Henreitta Leber, MD Medical Director of Neuro-Oncology [facility] 02/25/23 2:30 PM

## 2023-02-28 ENCOUNTER — Inpatient Hospital Stay: Payer: Managed Care, Other (non HMO)

## 2023-02-28 ENCOUNTER — Encounter: Payer: Self-pay | Admitting: Internal Medicine

## 2023-02-28 ENCOUNTER — Ambulatory Visit: Payer: Managed Care, Other (non HMO)

## 2023-02-28 ENCOUNTER — Inpatient Hospital Stay (HOSPITAL_BASED_OUTPATIENT_CLINIC_OR_DEPARTMENT_OTHER): Payer: Managed Care, Other (non HMO) | Admitting: Internal Medicine

## 2023-02-28 ENCOUNTER — Inpatient Hospital Stay: Admission: RE | Admit: 2023-02-28 | Payer: 59 | Source: Ambulatory Visit

## 2023-02-28 VITALS — BP 135/93 | HR 58 | Temp 96.8°F | Ht 71.0 in | Wt 226.4 lb

## 2023-02-28 DIAGNOSIS — C3432 Malignant neoplasm of lower lobe, left bronchus or lung: Secondary | ICD-10-CM

## 2023-02-28 DIAGNOSIS — Z5112 Encounter for antineoplastic immunotherapy: Secondary | ICD-10-CM | POA: Diagnosis not present

## 2023-02-28 DIAGNOSIS — E538 Deficiency of other specified B group vitamins: Secondary | ICD-10-CM

## 2023-02-28 LAB — CBC WITH DIFFERENTIAL (CANCER CENTER ONLY)
Abs Immature Granulocytes: 0.27 10*3/uL — ABNORMAL HIGH (ref 0.00–0.07)
Basophils Absolute: 0.1 10*3/uL (ref 0.0–0.1)
Basophils Relative: 0 %
Eosinophils Absolute: 0.1 10*3/uL (ref 0.0–0.5)
Eosinophils Relative: 1 %
HCT: 49.6 % (ref 39.0–52.0)
Hemoglobin: 16.1 g/dL (ref 13.0–17.0)
Immature Granulocytes: 2 %
Lymphocytes Relative: 14 %
Lymphs Abs: 1.9 10*3/uL (ref 0.7–4.0)
MCH: 30.6 pg (ref 26.0–34.0)
MCHC: 32.5 g/dL (ref 30.0–36.0)
MCV: 94.1 fL (ref 80.0–100.0)
Monocytes Absolute: 1.2 10*3/uL — ABNORMAL HIGH (ref 0.1–1.0)
Monocytes Relative: 9 %
Neutro Abs: 10 10*3/uL — ABNORMAL HIGH (ref 1.7–7.7)
Neutrophils Relative %: 74 %
Platelet Count: 114 10*3/uL — ABNORMAL LOW (ref 150–400)
RBC: 5.27 MIL/uL (ref 4.22–5.81)
RDW: 14.9 % (ref 11.5–15.5)
WBC Count: 13.5 10*3/uL — ABNORMAL HIGH (ref 4.0–10.5)
nRBC: 0 % (ref 0.0–0.2)

## 2023-02-28 LAB — CMP (CANCER CENTER ONLY)
ALT: 138 U/L — ABNORMAL HIGH (ref 0–44)
AST: 66 U/L — ABNORMAL HIGH (ref 15–41)
Albumin: 3.5 g/dL (ref 3.5–5.0)
Alkaline Phosphatase: 128 U/L — ABNORMAL HIGH (ref 38–126)
Anion gap: 7 (ref 5–15)
BUN: 19 mg/dL (ref 6–20)
CO2: 26 mmol/L (ref 22–32)
Calcium: 8.5 mg/dL — ABNORMAL LOW (ref 8.9–10.3)
Chloride: 106 mmol/L (ref 98–111)
Creatinine: 0.89 mg/dL (ref 0.61–1.24)
GFR, Estimated: >60 mL/min (ref >60)
Glucose, Bld: 103 mg/dL — ABNORMAL HIGH (ref 70–99)
Potassium: 3.6 mmol/L (ref 3.5–5.1)
Sodium: 139 mmol/L (ref 135–145)
Total Bilirubin: 0.9 mg/dL (ref 0.3–1.2)
Total Protein: 6.5 g/dL (ref 6.5–8.1)

## 2023-02-28 LAB — URINALYSIS, COMPLETE (UACMP) WITH MICROSCOPIC
Bacteria, UA: NONE SEEN
Bilirubin Urine: NEGATIVE
Glucose, UA: NEGATIVE mg/dL
Hgb urine dipstick: NEGATIVE
Ketones, ur: NEGATIVE mg/dL
Leukocytes,Ua: NEGATIVE
Nitrite: NEGATIVE
Protein, ur: NEGATIVE mg/dL
Specific Gravity, Urine: 1.024 (ref 1.005–1.030)
pH: 5 (ref 5.0–8.0)

## 2023-02-28 MED ORDER — SODIUM CHLORIDE 0.9 % IV SOLN
15.0000 mg/kg | Freq: Once | INTRAVENOUS | Status: AC
  Administered 2023-02-28: 1600 mg via INTRAVENOUS
  Filled 2023-02-28: qty 64

## 2023-02-28 MED ORDER — ALBUTEROL SULFATE HFA 108 (90 BASE) MCG/ACT IN AERS: 2.0000 | INHALATION_SPRAY | Freq: Four times a day (QID) | RESPIRATORY_TRACT | 2 refills | Status: DC | PRN

## 2023-02-28 MED ORDER — SODIUM CHLORIDE 0.9 % IV SOLN
500.0000 mg/m2 | Freq: Once | INTRAVENOUS | Status: AC
  Administered 2023-02-28: 1100 mg via INTRAVENOUS
  Filled 2023-02-28: qty 40

## 2023-02-28 MED ORDER — SODIUM CHLORIDE 0.9 % IV SOLN
Freq: Once | INTRAVENOUS | Status: AC
  Filled 2023-02-28: qty 250

## 2023-02-28 MED ORDER — CYANOCOBALAMIN 1000 MCG/ML IJ SOLN
1000.0000 ug | Freq: Once | INTRAMUSCULAR | Status: AC
  Administered 2023-02-28: 1000 ug via INTRAMUSCULAR
  Filled 2023-02-28: qty 1

## 2023-02-28 MED ORDER — HEPARIN SOD (PORK) LOCK FLUSH 100 UNIT/ML IV SOLN
500.0000 [IU] | Freq: Once | INTRAVENOUS | Status: AC | PRN
  Administered 2023-02-28: 500 [IU]
  Filled 2023-02-28: qty 5

## 2023-02-28 MED ORDER — PROCHLORPERAZINE MALEATE 10 MG PO TABS
10.0000 mg | ORAL_TABLET | Freq: Once | ORAL | Status: AC
  Administered 2023-02-28: 10 mg via ORAL
  Filled 2023-02-28: qty 1

## 2023-02-28 NOTE — Patient Instructions (Signed)
[place] [facility]  Discharge Instructions: Thank you for choosing [facility] to provide your oncology and hematology care.  If you have a lab appointment with the Cancer Center, please go directly to the Cancer Center and check in at the registration area.  Wear comfortable clothing and clothing appropriate for easy access to any Portacath or PICC line.   We strive to give you quality time with your provider. You may need to reschedule your appointment if you arrive late (15 or more minutes).  Arriving late affects you and other patients whose appointments are after yours.  Also, if you miss three or more appointments without notifying the office, you may be dismissed from the clinic at the provider's discretion.      For prescription refill requests, have your pharmacy contact our office and allow 72 hours for refills to be completed.    Today you received the following chemotherapy and/or immunotherapy agents MVASI, Alimta      To help prevent nausea and vomiting after your treatment, we encourage you to take your nausea medication as directed.  BELOW ARE SYMPTOMS THAT SHOULD BE REPORTED IMMEDIATELY: *FEVER GREATER THAN 100.4 F (38 C) OR HIGHER *CHILLS OR SWEATING *NAUSEA AND VOMITING THAT IS NOT CONTROLLED WITH YOUR NAUSEA MEDICATION *UNUSUAL SHORTNESS OF BREATH *UNUSUAL BRUISING OR BLEEDING *URINARY PROBLEMS (pain or burning when urinating, or frequent urination) *BOWEL PROBLEMS (unusual diarrhea, constipation, pain near the anus) TENDERNESS IN MOUTH AND THROAT WITH OR WITHOUT PRESENCE OF ULCERS (sore throat, sores in mouth, or a toothache) UNUSUAL RASH, SWELLING OR PAIN  UNUSUAL VAGINAL DISCHARGE OR ITCHING   Items with * indicate a potential emergency and should be followed up as soon as possible or go to the Emergency Department if any problems should occur.  Please show the CHEMOTHERAPY ALERT CARD or IMMUNOTHERAPY ALERT CARD at check-in  to the Emergency Department and triage nurse.  Should you have questions after your visit or need to cancel or reschedule your appointment, please contact [place] [facility]  [phone] and follow the prompts.  Office hours are 8:00 a.m. to 4:30 p.m. Monday - Friday. Please note that voicemails left after 4:00 p.m. may not be returned until the following business day.  We are closed weekends and major holidays. You have access to a nurse at all times for urgent questions. Please call the main number to the clinic [phone] and follow the prompts.  For any non-urgent questions, you may also contact your provider using MyChart. We now offer e-Visits for anyone 22 and older to request care online for non-urgent symptoms. For details visit [email or web address].   Also download the MyChart app! Go to the app store, search "MyChart", open the app, select [facility], and log in with your MyChart username and password.

## 2023-02-28 NOTE — Progress Notes (Signed)
[facility] CONSULT NOTE  Patient Care Team: Pcp, No as PCP - General [name], RN as Oncology Nurse Navigator [name], MD as Consulting Physician (Internal Medicine)  CHIEF COMPLAINTS/PURPOSE OF CONSULTATION: Lung cancer  #  Oncology History Overview Note  IMPRESSION: 1. Patchy nodular fat stranding throughout the anterior left upper quadrant peritoneal fat, nonspecific, cannot exclude peritoneal carcinomatosis. Dedicated CT abdomen/pelvis with oral and IV contrast recommended for further evaluation. 2. Dense patchy consolidation replacing much of the left lower lung lobe, appearing masslike in the superior segment left lower lobe, with associated bulging of the left major fissure and associated left lower lobe volume loss. Fine nodularity throughout both lungs with an upper lobe predominance. Asymmetric left upper lobe interlobular septal thickening. These findings are indeterminate, with differential including multilobar pneumonia, sarcoidosis or a neoplastic process. The persistence on radiographs back to 01/20/2021 despite antibiotic therapy make sarcoidosis or a neoplastic process more likely. Pulmonology consultation suggested for consideration of bronchoscopic evaluation. 3. Small dependent left pleural effusion. 4. Mild mediastinal lymphadenopathy, nonspecific. 5. Subacute healing lateral right sixth rib fracture.   DIAGNOSIS:  A. LUNG, LEFT LOWER LOBE; ENB-ASSISTED BIOPSY:  - NON-SMALL CELL CARCINOMA, FAVOR ADENOCARCINOMA.  - FOREIGN MATERIAL SUGGESTIVE OF ASPIRATION.   # EGFR MUTATED: 1 del- June 28th, 2022- [name]. [limited]  # Asymptomatic multiple brain mets subcentimeter asymptomatic -JUNE 29th, 2023- Numerous metastatic lesions in the supratentorial brain-increase in number also conspicuity compared to January 2023.  Pre-SBRT MRI brain AUG 18th, 2023-  Eighteen small enhancing brain metastases (punctate to 11 mm); were all  present on 03/28/2021-s/p SBRT SEP 2023- [[place]]; SRS/ radiation treatment for brain metastasis He finished treatment on 11-26-22.   # SEP-OCT-worsening left-sided pleural effusion-status post paracentesis; exudative; Cytology negative- ?  Progressive disease.  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  While on osimertinib- NOV 1st, 2023- PET scan- shows progressive disease in liver. Liquid Biopsy/Guardant testing-positive for EGFR mutation; otherwise Negative for any novel mutations.  Repeat liver Biopsy positive for adenocarcinoma. NEGATIVE for MET amplification.  S/p second opinion at [facility]- Dr.Stinchcomb. status post 4 cycles of carbo Taxol-Atezo+Bev- JAN 25th, 2024-PET- Interval response to therapy with resolution of previously demonstrated extensive hypermetabolic activity throughout the liver. PARIL 2024- ? Partial response, but clinically worse/rising LFTs  # MAY 6th, Brain MRI-progressive multiple brain metastases  # MAY 16th, 2024- Alimta+Avastin #1  # MAY 16th, 2024-    Cancer of lower lobe of left lung (HCC)  03/23/2021 Initial Diagnosis   Cancer of lower lobe of left lung (HCC)   03/29/2021 Cancer Staging   Staging form: Lung, AJCC 8th Edition - Clinical: Stage IVB (cT3, cN3, pM1c) - Signed by [name], MD on 03/29/2021   03/30/2021 - 03/30/2021 Chemotherapy   Patient is on Treatment Plan : LUNG NSCLC Pemetrexed (Alimta) / Carboplatin q21d x 1 cycles     08/17/2022 - 01/15/2023 Chemotherapy   Patient is on Treatment Plan : LUNG Atezolizumab + Bevacizumab + Carboplatin + Paclitaxel q21d Induction x 4 cycles / Atezolizumab + Bevacizumab q21d Maintenance     02/28/2023 -  Chemotherapy   Patient is on Treatment Plan : LUNG NON-SMALL CELL Bevacizumab + Pemetrexed q21d      HISTORY OF PRESENTING ILLNESS: Ambulating independently.  Accompanied by his father.  [name] 42 y.o.   male patient with stage IV lung cancer adenocarcinoma-brain mets [synchronous] bone  mets EGFR mutated -currently on maintenance Atezo-Bev is here for follow-up/reviews of the brain MRI.  In the interim patient was recently evaluated at radiation oncology at [facility] with MRI brain.  And also status post evaluation with Dr. Barbaraann Cao neuro-oncology.  Patient currently on prednisone 1 mg/kg dose for the last 1 week.  Notes slight improvement of his energy levels.  C/o wheezing qhs x3-4 weeks and cough x2 weeks   Denies any worsening headaches. No worsening pain. Intermittent nosebleeds- none currently.   Denies any worsening pain.  No worsening skin rash.   Review of Systems  Constitutional:  Positive for malaise/fatigue. Negative for chills, diaphoresis and fever.  HENT:  Negative for nosebleeds and sore throat.   Eyes:  Negative for double vision.  Respiratory:  Positive for cough. Negative for wheezing.   Cardiovascular:  Negative for chest pain, palpitations, orthopnea and leg swelling.  Gastrointestinal:  Positive for heartburn and nausea. Negative for abdominal pain, blood in stool, diarrhea and melena.  Genitourinary:  Negative for dysuria, frequency and urgency.  Musculoskeletal:  Negative for back pain and joint pain.  Skin:  Negative for itching.  Neurological:  Negative for tingling and focal weakness.  Endo/Heme/Allergies:  Does not bruise/bleed easily.  Psychiatric/Behavioral:  Negative for depression. The patient is not nervous/anxious and does not have insomnia.      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    SURGICAL HISTORY: Past Surgical History:  Procedure Laterality Date   FRACTURE SURGERY     broke femur when he was 6 years   PORTA CATH INSERTION N/A 03/28/2021   Procedure: PORTA CATH INSERTION;  Surgeon: Renford Dills, MD;   Location: [facility] INVASIVE CV LAB;  Service: Cardiovascular;  Laterality: N/A;   VIDEO BRONCHOSCOPY WITH ENDOBRONCHIAL NAVIGATION N/A 03/15/2021   Procedure: VIDEO BRONCHOSCOPY WITH ENDOBRONCHIAL NAVIGATION;  Surgeon: Vida Rigger, MD;  Location: [facility] ORS;  Service: Thoracic;  Laterality: N/A;   VIDEO BRONCHOSCOPY WITH ENDOBRONCHIAL ULTRASOUND N/A 03/15/2021   Procedure: VIDEO BRONCHOSCOPY WITH ENDOBRONCHIAL ULTRASOUND;  Surgeon: Vida Rigger, MD;  Location: [facility] ORS;  Service: Thoracic;  Laterality: N/A;    SOCIAL HISTORY: Social History   Socioeconomic History   Marital status: Married    Spouse name: Darl Pikes   Number of children: 2   Years of education: Not on file   Highest education level: Not on file  Occupational History   Not on file  Tobacco Use   Smoking status: Never   Smokeless tobacco: Former    Types: Snuff, Chew  Vaping Use   Vaping Use: Never used  Substance and Sexual Activity   Alcohol use: Never   Drug use: Never   Sexual activity: Yes  Other Topics Concern   Not on file  Social History Narrative   [place]; with wife; 2 daughters[12 and 22]; never smoked; rare alcohol. 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 Dog and cat.   Social Determinants of Health   Financial Resource Strain: Low Risk  (05/10/2022)   Overall Financial Resource Strain (CARDIA)    Difficulty of Paying Living Expenses: Not hard at all  Food Insecurity: No Food Insecurity (05/10/2022)   Hunger Vital Sign    Worried About Running Out of Food in the Last Year: Never true    Ran Out of Food in the Last Year: Never  true  Transportation Needs: No Transportation Needs (03/16/2022)   PRAPARE - Administrator, Civil Service (Medical): No    Lack of Transportation (Non-Medical): No  Physical Activity: Not on file  Stress: Not on file  Social Connections: Socially Integrated (05/10/2022)   Social Connection and Isolation Panel [NHANES]    Frequency of Communication with Friends and Family: More than  three times a week    Frequency of Social Gatherings with Friends and Family: More than three times a week    Attends Religious Services: More than 4 times per year    Active Member of [employer] or Organizations: Yes    Attends Engineer, structural: More than 4 times per year    Marital Status: Married  Catering manager Violence: Not on file    FAMILY HISTORY: Family History  Problem Relation Age of Onset   High Cholesterol Mother    Hypertension Father    Lung cancer Father    Skin cancer Father     ALLERGIES:  is allergic to codeine.  MEDICATIONS:  Current Outpatient Medications  Medication Sig Dispense Refill   albuterol (VENTOLIN HFA) 108 (90 Base) MCG/ACT inhaler Inhale 2 puffs into the lungs every 6 (six) hours as needed for wheezing or shortness of breath. 8 g 2   apixaban (ELIQUIS) 5 MG TABS tablet Take 1 tablet (5 mg total) by mouth 2 (two) times daily. 60 tablet 2   calcium carbonate (TUMS EX) 750 MG chewable tablet Chew 1 tablet by mouth daily.     ergocalciferol (VITAMIN D2) 1.25 MG (50000 UT) capsule Take 1 capsule (50,000 Units total) by mouth once a week. 12 capsule 1   folic acid (FOLVITE) 1 MG tablet TAKE 1 TABLET BY MOUTH EVERY DAY 90 tablet 1   lidocaine-prilocaine (EMLA) cream Apply 1 Application topically as needed. 30 g 0   metoprolol succinate (TOPROL-XL) 50 MG 24 hr tablet Take 50 mg by mouth daily. Take with or immediately following a meal.     predniSONE (DELTASONE) 20 MG tablet Take with breakfast daily. Take 5 pills every morning for 1 week . Then begin 3 pills every morning with breakfast until otherwise directed. Do not stop taking medication until directed by physician 80 tablet 0   sertraline (ZOLOFT) 50 MG tablet Take 1 tablet (50 mg total) by mouth daily. 30 tablet 1   No current facility-administered medications for this visit.   Facility-Administered Medications Ordered in Other Visits  Medication Dose Route Frequency Provider Last Rate  Last Admin   heparin lock flush 100 UNIT/ML injection            heparin lock flush 100 UNIT/ML injection            sodium chloride flush (NS) 0.9 % injection 10 mL  10 mL Intravenous PRN [name], MD   10 mL at 07/28/21 0852      .  PHYSICAL EXAMINATION: ECOG PERFORMANCE STATUS: 1 - Symptomatic but completely ambulatory  Vitals:   02/28/23 0917  BP: (!) 135/93  Pulse: (!) 58  Temp: (!) 96.8 F (36 C)  SpO2: 97%       Filed Weights   02/28/23 0917  Weight: 226 lb 6.4 oz (102.7 kg)      Left cheek papular lesion noted.  No signs of infection.  Physical Exam HENT:     Head: Normocephalic and atraumatic.     Mouth/Throat:     Pharynx: No oropharyngeal exudate.  Eyes:     Pupils: Pupils are equal, round, and reactive to light.  Cardiovascular:     Rate and Rhythm: Normal rate and regular rhythm.  Pulmonary:     Effort: No respiratory distress.     Breath sounds: No wheezing.     Comments: Decreased breath sound on the left side compared to right. Abdominal:     General: Bowel sounds are normal. There is no distension.     Palpations: Abdomen is soft. There is no mass.     Tenderness: There is no abdominal tenderness. There is no guarding or rebound.  Musculoskeletal:        General: No tenderness. Normal range of motion.     Cervical back: Normal range of motion and neck supple.  Skin:    General: Skin is warm.  Neurological:     Mental Status: He is alert and oriented to person, place, and time.  Psychiatric:        Mood and Affect: Affect normal.    Fungal dermatitis bilateral buttocks groin/underneath abdominal pannus  LABORATORY DATA:  I have reviewed the data as listed Lab Results  Component Value Date   WBC 13.5 (H) 02/28/2023   HGB 16.1 02/28/2023   HCT 49.6 02/28/2023   MCV 94.1 02/28/2023   PLT 114 (L) 02/28/2023   Recent Labs    02/05/23 0833 02/19/23 0812 02/28/23 0927  NA 136 138 139  K 4.3 3.6 3.6  CL 104 105 106   CO2 26 25 26   GLUCOSE 96 112* 103*  BUN 11 13 19   CREATININE 0.94 0.83 0.89  CALCIUM 8.9 8.4* 8.5*  GFRNONAA >60 >60 >60  PROT 7.5 6.1* 6.5  ALBUMIN 3.7 3.2* 3.5  AST 72* 77* 66*  ALT 72* 147* 138*  ALKPHOS 131* 112 128*  BILITOT 0.6 0.5 0.9    RADIOGRAPHIC STUDIES: I have personally reviewed the radiological images as listed and agreed with the findings in the report. MR Brain W Wo Contrast  Addendum Date: 02/25/2023   ADDENDUM REPORT: 02/25/2023 07:56 ADDENDUM: 12 mm hazy lesion at the medial right temporal lobe on 14:64, intervally stable but a nontreated metastatic focus as discussed in tumor board this morning. Electronically Signed   By: Tiburcio Pea M.D.   On: 02/25/2023 07:56   Result Date: 02/25/2023 CLINICAL DATA:  Brain metastases, assess treatment response. Lung cancer. EXAM: MRI HEAD WITHOUT AND WITH CONTRAST TECHNIQUE: Multiplanar, multiecho pulse sequences of the brain and surrounding structures were obtained without and with intravenous contrast. CONTRAST:  10 mL of Vueway COMPARISON:  Head MRI 11/15/2022 FINDINGS: Brain: Image references are to series 13. New lesions: 1. 2 mm inferior left cerebellum (image 16). 2. 5 mm inferior left cerebellum (image 17). 3. 3 mm mid left cerebellum (image 32). 4. 2 mm cerebellar vermis (image 45). 5. 3 mm right occipital lobe (image 54). 6. 2 mm lateral right frontal lobe (image 110). 7. 3 mm medial right frontal lobe (image 111). 8. 4 mm medial right parietal lobe (image 114). 9. 3 mm posterolateral right parietal lobe (image 115). 10. 4 mm posterior left frontal lobe, located lateral to a previously treated lesion (image 116). 11. 3 mm posteromedial right frontal lobe (image 132). 12. 7 mm posteromedial right frontal lobe (image 133). 13. 3 mm left middle frontal gyrus (image 135). 14. 4 mm right precentral gyrus (image 142). Multiple other small lesions are either stable or smaller than on the prior study with some lesions no longer  being visible. Stable or smaller visible lesions are annotated with single arrows on series 13. Scattered chronic blood products are again noted associated with multiple lesions. There is no significant associated edema. Minimal T2 hyperintensity is again noted associated with treated lesions in the left precentral gyrus. No acute infarct, midline shift, hydrocephalus, or extra-axial fluid collection is evident. Vascular: Major intracranial vascular flow voids are preserved. Skull and upper cervical spine: Unremarkable bone marrow signal. Sinuses/Orbits: Unremarkable orbits. Small mucous retention cyst in the left maxillary sinus. Clear mastoid air cells. Other: None. IMPRESSION: 1. Fourteen new subcentimeter brain metastases. No associated edema. 2. Stable to decreased size of numerous other metastases. Electronically Signed: By: Sebastian Ache M.D. On: 02/20/2023 12:47   MR LIVER W WO CONTRAST  Result Date: 02/03/2023 CLINICAL DATA:  42 year old male with history of lung cancer with hypermetabolic liver lesions noted on prior PET-CT suspicious for metastatic disease. EXAM: MRI ABDOMEN WITHOUT AND WITH CONTRAST TECHNIQUE: Multiplanar multisequence MR imaging of the abdomen was performed both before and after the administration of intravenous contrast. CONTRAST:  10mL GADAVIST GADOBUTROL 1 MMOL/ML IV SOLN COMPARISON:  Abdominal MRI 09/04/2022.  PET-CT 01/31/2023. FINDINGS: Lower chest: Large complex left pleural fluid collection partially imaged, with substantial internal debris. Hepatobiliary: Please note that the metastatic lesions in the liver seen on the prior MRI were most evident on the fat saturated T2 weighted sequence of the prior examination (series 6 of the prior study), and accordingly, direct comparison with the prior examination are made predominantly from the corresponding series 15 of today's study. The majority of the previously noted areas of T2 signal intensity throughout the right lobe of the  liver on the prior examination have resolved, with a notable exception in segment 5 where there is a persistent mass-like area (axial image 19 of series 15) measuring 3.8 x 3.3 cm on today's study which is mildly T2 hyperintense, but very nondescript on post gadolinium imaging, but with some mild diffusion restriction. Notably, there is focal capsular contraction overlying this area. More widespread rather infiltrative appearing T2 hyperintensity is noted throughout the left lobe of the liver, also rather unremarkable on post gadolinium imaging, but demonstrating mild diffuse diffusion restriction. No definite new hepatic lesions are confidently identified. Pancreas: No pancreatic mass. No pancreatic ductal dilatation. No pancreatic or peripancreatic fluid collections or inflammatory changes. Spleen:  Unremarkable. Adrenals/Urinary Tract: Bilateral kidneys and bilateral adrenal glands are normal in appearance. No hydroureteronephrosis in the visualized portions of the abdomen. Stomach/Bowel: Visualized portions are unremarkable. Vascular/Lymphatic: No aneurysm identified in the visualized abdominal vasculature. No lymphadenopathy noted in the abdomen. Other: No significant volume of ascites noted in the visualized portions of the peritoneal cavity. Musculoskeletal: No aggressive appearing osseous lesions are noted in the visualized portions of the skeleton. IMPRESSION: 1. Today's study demonstrates a positive response to therapy when compared to prior abdominal MRI in terms of regression of diffuse hepatic metastatic disease. There are multiple residual areas of T2 signal abnormality which correspond to the previously recognized lesions, however, these appear less extensive, and are now very nondescript on post gadolinium imaging with some associated capsular retraction and diffusion restriction. Much of these imaging findings may be attributable to developing areas of hepatic fibrosis at the site of treated  lesions, however, given the hypermetabolism in these regions on the recent PET-CT, some degree of residual tumor is not excluded. No new lesions are identified. Continued attention on follow-up studies is recommended. 2. Large complex left pleural effusion. Electronically Signed  By: Trudie Reed M.D.   On: 02/03/2023 11:00   NM PET Image Restage (PS) Skull Base to Thigh (F-18 FDG)  Result Date: 02/01/2023 CLINICAL DATA:  Subsequent treatment strategy for lung malignancy. EXAM: NUCLEAR MEDICINE PET SKULL BASE TO THIGH TECHNIQUE: 12.6 mCi F-18 FDG was injected intravenously. Full-ring PET imaging was performed from the skull base to thigh after the radiotracer. CT data was obtained and used for attenuation correction and anatomic localization. Fasting blood glucose: 92 mg/dl COMPARISON:  PET-CT dated November 08, 2022 FINDINGS: Mediastinal blood pool activity: SUV max 2.3 Liver activity: SUV max NA NECK: No hypermetabolic lymph nodes in the neck. Incidental CT findings: None. CHEST: New hypermetabolic subcentimeter left axillary lymph node with SUV max of 3.1. 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 Subcentimeter left axillary lymph node measuring 6 mm in short axis on series 4, image 3, unchanged in size when compared with the prior exam with SUV max of 3.1. Incidental CT findings: Right chest wall port with tip in the right atrium. Small hiatal hernia. ABDOMEN/PELVIS: Increased irregular areas of FDG uptake associated with previously described metastatic liver lesions. Reference lesion of the right hepatic lobe with SUV max of 7.2 on series 610, image 88 not well visualized on associated noncontrast CT. No abnormal hypermetabolic activity within the, pancreas, adrenal glands, or spleen. No hypermetabolic lymph nodes in the abdomen or  pelvis. Incidental CT findings: None. SKELETON: No focal hypermetabolic activity to suggest skeletal metastasis. Incidental CT findings: None. IMPRESSION: 1. 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 2. New irregular areas of hypermetabolic activity in the liver associated with previously described metastatic liver lesions, findings may be due to pseudoprogression or progressive disease. Contrast-enhanced liver MRI could be performed to better assess for tumor viability, alternatively short-term follow-up PET CT could be performed. 3. Subcentimeter left axillary lymph node new mild FDG uptake, possibly reactive, although progressive disease is a concern. Electronically Signed   By: Allegra Lai M.D.   On: 02/01/2023 12:43    ASSESSMENT & PLAN:   Cancer of lower lobe of left lung (HCC) # STAGE IV- [JUNE 2022] Left lower lobe lung cancer-adenocarcinoma the lung;  EGFR- 19 del POSITIVE. s /p second opinion at [facility]- Dr.Stinchcomb. status post 4 cycles of carbo Taxol-Atezo+Bev- JAN 25th, 2024-PET- Interval response to therapy with resolution of previously demonstrated extensive hypermetabolic activity throughout the liver.   # Currently on maintenance Atezo+Bev- PET scan April 17th- Unchanged appearance of chronic loculated left pleural effusion/fibrothorax with new hypermetabolic activity of the pleura and adjacent consolidations, findings may be due to pseudoprogression or progressive disease. New irregular areas of hypermetabolic activity in the liver associated with previously described metastatic liver lesions, findings may be due to pseudoprogression or progressive disease. April 21st- liver MRI-  showed positive response to therapy when compared to prior abdominal MRI in terms of regression of diffuse hepatic metastatic disease.   Much of these imaging findings may be attributable to developing areas of hepatic fibrosis at the site of treated lesions, however, given the hypermetabolism in these regions on the recent PET-CT, some degree of residual tumor is not excluded. No new lesions are identified.  However clinically concerned about progression.  Discontinue Cycle #5  -Atezo + bev-given clinical  concerns of progression [extreme fatigue rising LFTs]. Discussed at tumor conference.  Discussed with Dr. Eber Hong re: antibody drug conjugate.  Currently none available at [facility]; possible expanded access availability.  For now proceed with subsequent line of therapy-  Alimta+ M-vasi.  # Proceed with Alimta+ M-vasi. Labs today reviewed;  acceptable for treatment today- see re: elevated LFTs.  Proceed with B12 injection; continue oral folic acid.  # Elevated LFTs: Normal bilirubin-question related to immunotherapy versus progressive disease.  Prednisone 100 mg x 1 week no significant improvement noted in the LFTs.  Change of treatment as above.  And also decrease the dose of steroids to 4 pills a day for the next 1 week.   # wheezing qhs x3-4 weeks and cough x2 week-question allergies versus reactive airway disease.  Continue antihistamines; patient already on prednisone.  Add albuterol inhaler.  Monitor closely.  # Unexplained severe fatigue-unclear etiology s/p prednisone as above improved.  Continue prednisone taper-as above monitor closely.  #Asymptomatic multiple brain mets subcentimeter asymptomatic-  -s/p SBRT SEP 2023- [[place]].  S/p SBRT [[place]]- FEB 12th, 2024. MAY 6th, 2024- MRI Brain-Fourteen new subcentimeter brain metastases. No associated edema. Stable to decreased size of numerous other metastases.  Discussed with Dr. Basilio Cairo.  S/p evaluation with neuro-oncology Dr. Verdie Shire whole brain radiation down the line.  Awaiting repeat MRI in July.  # bone metastases-Hypocalcemia: mild- continue calcium 1200 plus vitamin D-3  1000-over-the-counter-1 pill a day.  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 On Eliquis-5 mg BID. stable.  #Bone metastases- CT DEc 7th, 2022 to healing process.  Dental evaluation on 9/21.  Continue calcium vitamin D intake. calcium 8.4- stable.  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  # IV access/ port flush.   [Tuesday/wed]- pref B12 #1 on 5/16  # DISPOSITION:  # Alimta-M-vasi + b12 today # follow up in 1 week [Tuesday/wed]- MD; labs cbc/cmp- No chemo-  # Follow up in 3 weeks- MD; port-labs- cbc/cmp; CEA; UA; Alimta+ M-vasi - Dr.B  # 40 minutes face-to-face with the patient discussing the above plan of care; more than 50% of time spent on prognosis/ natural history; counseling and coordination.    All questions were answered. The patient knows to call the clinic with any problems, questions or concerns.    [name], MD 02/28/2023 1:13 PM

## 2023-02-28 NOTE — Progress Notes (Signed)
C/o wheezing qhs x3-4 weeks and cough x2 weeks.

## 2023-02-28 NOTE — Assessment & Plan Note (Addendum)
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 s /p second opinion at [facility]- Dr.Stinchcomb. status post 4 cycles of carbo Taxol-Atezo+Bev- JAN 25th, 2024-PET- Interval response to therapy with resolution of previously demonstrated extensive hypermetabolic activity throughout the liver.   # Currently on maintenance Atezo+Bev- PET scan April 17th- Unchanged appearance of chronic loculated left pleural effusion/fibrothorax with new hypermetabolic activity of the pleura and adjacent consolidations, findings may be due to pseudoprogression or progressive disease. New irregular areas of hypermetabolic activity in the liver associated with previously described metastatic liver lesions, findings may be due to pseudoprogression or progressive disease. April 21st- liver MRI-  showed positive response to therapy when compared to prior abdominal MRI in terms of regression of diffuse hepatic metastatic disease.  Much of these imaging findings may be attributable to developing areas of hepatic fibrosis at the site of treated lesions, however, given the hypermetabolism in these regions on the recent PET-CT, some degree of residual tumor is not excluded. No new lesions are identified.  However clinically concerned about progression.  Discontinue Cycle #5  -Atezo + bev-given clinical concerns of progression [extreme fatigue rising LFTs]. Discussed at tumor conference.  Discussed with Dr. Eber Hong re: antibody drug conjugate.  Currently none available at [facility]; possible expanded access availability.  For now proceed with subsequent line of therapy-  Alimta+ M-vasi.  # Proceed with Alimta+ M-vasi. Labs today reviewed;  acceptable for treatment today- see re: elevated LFTs.  Proceed with B12 injection; continue oral folic acid.  # Elevated LFTs: Normal bilirubin-question related to immunotherapy versus progressive disease.  Prednisone 100 mg x 1 week no significant improvement  noted in the LFTs.  Change of treatment as above.  And also decrease the dose of steroids to 4 pills a day for the next 1 week.   # wheezing qhs x3-4 weeks and cough x2 week-question allergies versus reactive airway disease.  Continue antihistamines; patient already on prednisone.  Add albuterol inhaler.  Monitor closely.  # Unexplained severe fatigue-unclear etiology s/p prednisone as above improved.  Continue prednisone taper-as above monitor closely.  #Asymptomatic multiple brain mets subcentimeter asymptomatic-  -s/p SBRT SEP 2023- [[place]].  S/p SBRT [[place]]- FEB 12th, 2024. MAY 6th, 2024- MRI Brain-Fourteen new subcentimeter brain metastases. No associated edema. Stable to decreased size of numerous other metastases.  Discussed with Dr. Basilio Cairo.  S/p evaluation with neuro-oncology Dr. Verdie Shire whole brain radiation down the line.  Awaiting repeat MRI in July.  # bone metastases-Hypocalcemia: mild- continue calcium 1200 plus vitamin D-3 1000-over-the-counter-1 pill a day.  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 On Eliquis-5 mg BID. stable.  #Bone metastases- CT DEc 7th, 2022 to healing process.  Dental evaluation on 9/21.  Continue calcium vitamin D intake. calcium 8.4- stable.  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  # IV access/ port flush.   [Tuesday/wed]- pref B12 #1 on 5/16  # DISPOSITION:  # Alimta-M-vasi + b12 today # follow up in 1 week [Tuesday/wed]- MD; labs cbc/cmp- No chemo-  # Follow up in 3 weeks- MD; port-labs- cbc/cmp; CEA; UA; Alimta+ M-vasi - Dr.B  # 40 minutes face-to-face with the patient discussing the above plan of care; more  than 50% of time spent on  prognosis/ natural history; counseling and coordination.

## 2023-03-02 LAB — CEA: CEA: 19.6 ng/mL — ABNORMAL HIGH (ref 0.0–4.7)

## 2023-03-05 ENCOUNTER — Ambulatory Visit: Payer: Managed Care, Other (non HMO) | Admitting: Internal Medicine

## 2023-03-05 ENCOUNTER — Ambulatory Visit: Payer: Managed Care, Other (non HMO)

## 2023-03-05 ENCOUNTER — Ambulatory Visit: Payer: Self-pay | Admitting: Radiation Oncology

## 2023-03-05 ENCOUNTER — Other Ambulatory Visit: Payer: Managed Care, Other (non HMO)

## 2023-03-06 ENCOUNTER — Inpatient Hospital Stay (HOSPITAL_BASED_OUTPATIENT_CLINIC_OR_DEPARTMENT_OTHER): Payer: Managed Care, Other (non HMO) | Admitting: Internal Medicine

## 2023-03-06 ENCOUNTER — Inpatient Hospital Stay: Payer: Managed Care, Other (non HMO)

## 2023-03-06 ENCOUNTER — Encounter: Payer: Self-pay | Admitting: Internal Medicine

## 2023-03-06 DIAGNOSIS — C3432 Malignant neoplasm of lower lobe, left bronchus or lung: Secondary | ICD-10-CM | POA: Diagnosis not present

## 2023-03-06 DIAGNOSIS — Z95828 Presence of other vascular implants and grafts: Secondary | ICD-10-CM

## 2023-03-06 DIAGNOSIS — Z5112 Encounter for antineoplastic immunotherapy: Secondary | ICD-10-CM | POA: Diagnosis not present

## 2023-03-06 LAB — CBC WITH DIFFERENTIAL (CANCER CENTER ONLY)
Abs Immature Granulocytes: 0.05 10*3/uL (ref 0.00–0.07)
Basophils Absolute: 0 10*3/uL (ref 0.0–0.1)
Basophils Relative: 0 %
Eosinophils Absolute: 0 10*3/uL (ref 0.0–0.5)
Eosinophils Relative: 0 %
HCT: 44 % (ref 39.0–52.0)
Hemoglobin: 14.6 g/dL (ref 13.0–17.0)
Immature Granulocytes: 1 %
Lymphocytes Relative: 14 %
Lymphs Abs: 1 10*3/uL (ref 0.7–4.0)
MCH: 30.6 pg (ref 26.0–34.0)
MCHC: 33.2 g/dL (ref 30.0–36.0)
MCV: 92.2 fL (ref 80.0–100.0)
Monocytes Absolute: 0.2 10*3/uL (ref 0.1–1.0)
Monocytes Relative: 3 %
Neutro Abs: 5.6 10*3/uL (ref 1.7–7.7)
Neutrophils Relative %: 82 %
Platelet Count: 82 10*3/uL — ABNORMAL LOW (ref 150–400)
RBC: 4.77 MIL/uL (ref 4.22–5.81)
RDW: 14.7 % (ref 11.5–15.5)
WBC Count: 6.9 10*3/uL (ref 4.0–10.5)
nRBC: 0 % (ref 0.0–0.2)

## 2023-03-06 LAB — CMP (CANCER CENTER ONLY)
ALT: 215 U/L — ABNORMAL HIGH (ref 0–44)
AST: 116 U/L — ABNORMAL HIGH (ref 15–41)
Albumin: 3.2 g/dL — ABNORMAL LOW (ref 3.5–5.0)
Alkaline Phosphatase: 122 U/L (ref 38–126)
Anion gap: 8 (ref 5–15)
BUN: 20 mg/dL (ref 6–20)
CO2: 24 mmol/L (ref 22–32)
Calcium: 7.9 mg/dL — ABNORMAL LOW (ref 8.9–10.3)
Chloride: 104 mmol/L (ref 98–111)
Creatinine: 0.68 mg/dL (ref 0.61–1.24)
GFR, Estimated: >60 mL/min (ref >60)
Glucose, Bld: 122 mg/dL — ABNORMAL HIGH (ref 70–99)
Potassium: 3.4 mmol/L — ABNORMAL LOW (ref 3.5–5.1)
Sodium: 136 mmol/L (ref 135–145)
Total Bilirubin: 2 mg/dL — ABNORMAL HIGH (ref 0.3–1.2)
Total Protein: 5.9 g/dL — ABNORMAL LOW (ref 6.5–8.1)

## 2023-03-06 MED ORDER — PREDNISONE 20 MG PO TABS: ORAL_TABLET | ORAL | 0 refills | Status: DC

## 2023-03-06 MED ORDER — SODIUM CHLORIDE 0.9% FLUSH
10.0000 mL | Freq: Once | INTRAVENOUS | Status: AC
  Administered 2023-03-06: 10 mL via INTRAVENOUS
  Filled 2023-03-06: qty 10

## 2023-03-06 MED ORDER — HEPARIN SOD (PORK) LOCK FLUSH 100 UNIT/ML IV SOLN
500.0000 [IU] | Freq: Once | INTRAVENOUS | Status: AC
  Administered 2023-03-06: 500 [IU] via INTRAVENOUS
  Filled 2023-03-06: qty 5

## 2023-03-06 NOTE — Progress Notes (Signed)
[facility] CONSULT NOTE  Patient Care Team: Pcp, No as PCP - General [name], RN as Oncology Nurse Navigator [name], MD as Consulting Physician (Internal Medicine)  CHIEF COMPLAINTS/PURPOSE OF CONSULTATION: Lung cancer  #  Oncology History Overview Note  IMPRESSION: 1. Patchy nodular fat stranding throughout the anterior left upper quadrant peritoneal fat, nonspecific, cannot exclude peritoneal carcinomatosis. Dedicated CT abdomen/pelvis with oral and IV contrast recommended for further evaluation. 2. Dense patchy consolidation replacing much of the left lower lung lobe, appearing masslike in the superior segment left lower lobe, with associated bulging of the left major fissure and associated left lower lobe volume loss. Fine nodularity throughout both lungs with an upper lobe predominance. Asymmetric left upper lobe interlobular septal thickening. These findings are indeterminate, with differential including multilobar pneumonia, sarcoidosis or a neoplastic process. The persistence on radiographs back to 01/20/2021 despite antibiotic therapy make sarcoidosis or a neoplastic process more likely. Pulmonology consultation suggested for consideration of bronchoscopic evaluation. 3. Small dependent left pleural effusion. 4. Mild mediastinal lymphadenopathy, nonspecific. 5. Subacute healing lateral right sixth rib fracture.   DIAGNOSIS:  A. LUNG, LEFT LOWER LOBE; ENB-ASSISTED BIOPSY:  - NON-SMALL CELL CARCINOMA, FAVOR ADENOCARCINOMA.  - FOREIGN MATERIAL SUGGESTIVE OF ASPIRATION.   # EGFR MUTATED: 8 del- June 28th, 2022- [name]. [limited]  # Asymptomatic multiple brain mets subcentimeter asymptomatic -JUNE 29th, 2023- Numerous metastatic lesions in the supratentorial brain-increase in number also conspicuity compared to January 2023.  Pre-SBRT MRI brain AUG 18th, 2023-  Eighteen small enhancing brain metastases (punctate to 11 mm); were all  present on 03/28/2021-s/p SBRT SEP 2023- [[place]]; SRS/ radiation treatment for brain metastasis He finished treatment on 11-26-22.   # SEP-OCT-worsening left-sided pleural effusion-status post paracentesis; exudative; Cytology negative- ?  Progressive disease.  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  While on osimertinib- NOV 1st, 2023- PET scan- shows progressive disease in liver. Liquid Biopsy/Guardant testing-positive for EGFR mutation; otherwise Negative for any novel mutations.  Repeat liver Biopsy positive for adenocarcinoma. NEGATIVE for MET amplification.  S/p second opinion at [facility]- Dr.Stinchcomb. status post 4 cycles of carbo Taxol-Atezo+Bev- JAN 25th, 2024-PET- Interval response to therapy with resolution of previously demonstrated extensive hypermetabolic activity throughout the liver. PARIL 2024- ? Partial response, but clinically worse/rising LFTs  # MAY 6th, Brain MRI-progressive multiple brain metastases  # MAY 16th, 2024- Alimta+Avastin #1  # MAY 16th, 2024-    Cancer of lower lobe of left lung (HCC)  03/23/2021 Initial Diagnosis   Cancer of lower lobe of left lung (HCC)   03/29/2021 Cancer Staging   Staging form: Lung, AJCC 8th Edition - Clinical: Stage IVB (cT3, cN3, pM1c) - Signed by [name], MD on 03/29/2021   03/30/2021 - 03/30/2021 Chemotherapy   Patient is on Treatment Plan : LUNG NSCLC Pemetrexed (Alimta) / Carboplatin q21d x 1 cycles     08/17/2022 - 01/15/2023 Chemotherapy   Patient is on Treatment Plan : LUNG Atezolizumab + Bevacizumab + Carboplatin + Paclitaxel q21d Induction x 4 cycles / Atezolizumab + Bevacizumab q21d Maintenance     02/28/2023 -  Chemotherapy   Patient is on Treatment Plan : LUNG NON-SMALL CELL Bevacizumab + Pemetrexed q21d      HISTORY OF PRESENTING ILLNESS: Ambulating independently.  Accompanied by his wife.   [name] 42 y.o.   male patient with stage IV lung cancer adenocarcinoma-brain mets [synchronous]  bone mets EGFR mutated -currently on Alimta- Bev is here for follow-up.  C/o bruising and nose bleeds. Patient currently on prednisone 80 mg day dose x1 week.   Shortness of breath coughing improved.  Denies any abdominal pain nausea vomiting. Denies any worsening headaches. No worsening pain.  Denies any worsening pain.  No worsening skin rash.   Review of Systems  Constitutional:  Positive for malaise/fatigue. Negative for chills, diaphoresis and fever.  HENT:  Negative for nosebleeds and sore throat.   Eyes:  Negative for double vision.  Respiratory:  Positive for cough. Negative for wheezing.   Cardiovascular:  Negative for chest pain, palpitations, orthopnea and leg swelling.  Gastrointestinal:  Positive for heartburn and nausea. Negative for abdominal pain, blood in stool, diarrhea and melena.  Genitourinary:  Negative for dysuria, frequency and urgency.  Musculoskeletal:  Negative for back pain and joint pain.  Skin:  Negative for itching.  Neurological:  Negative for tingling and focal weakness.  Endo/Heme/Allergies:  Does not bruise/bleed easily.  Psychiatric/Behavioral:  Negative for depression. The patient is not nervous/anxious and does not have insomnia.      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    SURGICAL HISTORY: Past Surgical History:  Procedure Laterality Date   FRACTURE SURGERY     broke femur when he was 6 years   PORTA CATH INSERTION N/A 03/28/2021   Procedure: PORTA CATH INSERTION;  Surgeon: Renford Dills, MD;  Location: [facility] INVASIVE CV LAB;  Service: Cardiovascular;  Laterality: N/A;   VIDEO BRONCHOSCOPY WITH ENDOBRONCHIAL NAVIGATION N/A 03/15/2021   Procedure: VIDEO BRONCHOSCOPY WITH ENDOBRONCHIAL NAVIGATION;  Surgeon: Vida Rigger, MD;   Location: [facility] ORS;  Service: Thoracic;  Laterality: N/A;   VIDEO BRONCHOSCOPY WITH ENDOBRONCHIAL ULTRASOUND N/A 03/15/2021   Procedure: VIDEO BRONCHOSCOPY WITH ENDOBRONCHIAL ULTRASOUND;  Surgeon: Vida Rigger, MD;  Location: [facility] ORS;  Service: Thoracic;  Laterality: N/A;    SOCIAL HISTORY: Social History   Socioeconomic History   Marital status: Married    Spouse name: Darl Pikes   Number of children: 2   Years of education: Not on file   Highest education level: Not on file  Occupational History   Not on file  Tobacco Use   Smoking status: Never   Smokeless tobacco: Former    Types: Snuff, Chew  Vaping Use   Vaping Use: Never used  Substance and Sexual Activity   Alcohol use: Never   Drug use: Never   Sexual activity: Yes  Other Topics Concern   Not on file  Social History Narrative   [place]; with wife; 2 daughters[12 and 22]; never smoked; rare alcohol. 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 Dog and cat.   Social Determinants of Health   Financial Resource Strain: Low Risk  (05/10/2022)   Overall Financial Resource Strain (CARDIA)    Difficulty of Paying Living Expenses: Not hard at all  Food Insecurity: No Food Insecurity (05/10/2022)   Hunger Vital Sign    Worried About Running Out of Food in the Last Year: Never true    Ran Out of Food in the Last Year: Never true  Transportation Needs: No Transportation Needs (03/16/2022)   PRAPARE - Administrator, Civil Service (Medical): No    Lack of Transportation (Non-Medical): No  Physical Activity: Not on file  Stress:  Not on file  Social Connections: Socially Integrated (05/10/2022)   Social Connection and Isolation Panel [NHANES]    Frequency of Communication with Friends and Family: More than three times a week    Frequency of Social Gatherings with Friends and Family: More than three times a week    Attends Religious Services: More than 4 times per year    Active Member of [employer] or Organizations: Yes    Attends Museum/gallery exhibitions officer: More than 4 times per year    Marital Status: Married  Catering manager Violence: Not on file    FAMILY HISTORY: Family History  Problem Relation Age of Onset   High Cholesterol Mother    Hypertension Father    Lung cancer Father    Skin cancer Father     ALLERGIES:  is allergic to codeine.  MEDICATIONS:  Current Outpatient Medications  Medication Sig Dispense Refill   albuterol (VENTOLIN HFA) 108 (90 Base) MCG/ACT inhaler Inhale 2 puffs into the lungs every 6 (six) hours as needed for wheezing or shortness of breath. 8 g 2   apixaban (ELIQUIS) 5 MG TABS tablet Take 1 tablet (5 mg total) by mouth 2 (two) times daily. 60 tablet 2   calcium carbonate (TUMS EX) 750 MG chewable tablet Chew 1 tablet by mouth daily.     ergocalciferol (VITAMIN D2) 1.25 MG (50000 UT) capsule Take 1 capsule (50,000 Units total) by mouth once a week. 12 capsule 1   folic acid (FOLVITE) 1 MG tablet TAKE 1 TABLET BY MOUTH EVERY DAY 90 tablet 1   lidocaine-prilocaine (EMLA) cream Apply 1 Application topically as needed. 30 g 0   metoprolol succinate (TOPROL-XL) 50 MG 24 hr tablet Take 50 mg by mouth daily. Take with or immediately following a meal.     predniSONE (DELTASONE) 20 MG tablet Take with breakfast daily. Take 4  pills every morning;  Do not stop taking medication until directed by physician. 80 tablet 0   sertraline (ZOLOFT) 50 MG tablet Take 1 tablet (50 mg total) by mouth daily. 30 tablet 1   No current facility-administered medications for this visit.   Facility-Administered Medications Ordered in Other Visits  Medication Dose Route Frequency Provider Last Rate Last Admin   heparin lock flush 100 UNIT/ML injection            heparin lock flush 100 UNIT/ML injection            sodium chloride flush (NS) 0.9 % injection 10 mL  10 mL Intravenous PRN [name], MD   10 mL at 07/28/21 0852      .  PHYSICAL EXAMINATION: ECOG PERFORMANCE STATUS: 1 - Symptomatic  but completely ambulatory  Vitals:   03/06/23 0933  BP: (!) 133/90  Pulse: 64  Temp: (!) 95.6 F (35.3 C)  SpO2: 98%       Filed Weights   03/06/23 0933  Weight: 226 lb 12.8 oz (102.9 kg)      Left cheek papular lesion noted.  No signs of infection.  Physical Exam HENT:     Head: Normocephalic and atraumatic.     Mouth/Throat:     Pharynx: No oropharyngeal exudate.  Eyes:     Pupils: Pupils are equal, round, and reactive to light.  Cardiovascular:     Rate and Rhythm: Normal rate and regular rhythm.  Pulmonary:     Effort: No respiratory distress.     Breath sounds: No wheezing.     Comments:  Decreased breath sound on the left side compared to right. Abdominal:     General: Bowel sounds are normal. There is no distension.     Palpations: Abdomen is soft. There is no mass.     Tenderness: There is no abdominal tenderness. There is no guarding or rebound.  Musculoskeletal:        General: No tenderness. Normal range of motion.     Cervical back: Normal range of motion and neck supple.  Skin:    General: Skin is warm.  Neurological:     Mental Status: He is alert and oriented to person, place, and time.  Psychiatric:        Mood and Affect: Affect normal.    Fungal dermatitis bilateral buttocks groin/underneath abdominal pannus  LABORATORY DATA:  I have reviewed the data as listed Lab Results  Component Value Date   WBC 6.9 03/06/2023   HGB 14.6 03/06/2023   HCT 44.0 03/06/2023   MCV 92.2 03/06/2023   PLT 82 (L) 03/06/2023   Recent Labs    02/19/23 0812 02/28/23 0927 03/06/23 0940  NA 138 139 136  K 3.6 3.6 3.4*  CL 105 106 104  CO2 25 26 24   GLUCOSE 112* 103* 122*  BUN 13 19 20   CREATININE 0.83 0.89 0.68  CALCIUM 8.4* 8.5* 7.9*  GFRNONAA >60 >60 >60  PROT 6.1* 6.5 5.9*  ALBUMIN 3.2* 3.5 3.2*  AST 77* 66* 116*  ALT 147* 138* 215*  ALKPHOS 112 128* 122  BILITOT 0.5 0.9 2.0*    RADIOGRAPHIC STUDIES: I have personally reviewed the  radiological images as listed and agreed with the findings in the report. MR Brain W Wo Contrast  Addendum Date: 02/25/2023   ADDENDUM REPORT: 02/25/2023 07:56 ADDENDUM: 12 mm hazy lesion at the medial right temporal lobe on 14:64, intervally stable but a nontreated metastatic focus as discussed in tumor board this morning. Electronically Signed   By: Tiburcio Pea M.D.   On: 02/25/2023 07:56   Result Date: 02/25/2023 CLINICAL DATA:  Brain metastases, assess treatment response. Lung cancer. EXAM: MRI HEAD WITHOUT AND WITH CONTRAST TECHNIQUE: Multiplanar, multiecho pulse sequences of the brain and surrounding structures were obtained without and with intravenous contrast. CONTRAST:  10 mL of Vueway COMPARISON:  Head MRI 11/15/2022 FINDINGS: Brain: Image references are to series 13. New lesions: 1. 2 mm inferior left cerebellum (image 16). 2. 5 mm inferior left cerebellum (image 17). 3. 3 mm mid left cerebellum (image 32). 4. 2 mm cerebellar vermis (image 45). 5. 3 mm right occipital lobe (image 54). 6. 2 mm lateral right frontal lobe (image 110). 7. 3 mm medial right frontal lobe (image 111). 8. 4 mm medial right parietal lobe (image 114). 9. 3 mm posterolateral right parietal lobe (image 115). 10. 4 mm posterior left frontal lobe, located lateral to a previously treated lesion (image 116). 11. 3 mm posteromedial right frontal lobe (image 132). 12. 7 mm posteromedial right frontal lobe (image 133). 13. 3 mm left middle frontal gyrus (image 135). 14. 4 mm right precentral gyrus (image 142). Multiple other small lesions are either stable or smaller than on the prior study with some lesions no longer being visible. Stable or smaller visible lesions are annotated with single arrows on series 13. Scattered chronic blood products are again noted associated with multiple lesions. There is no significant associated edema. Minimal T2 hyperintensity is again noted associated with treated lesions in the left precentral  gyrus. No acute infarct, midline  shift, hydrocephalus, or extra-axial fluid collection is evident. Vascular: Major intracranial vascular flow voids are preserved. Skull and upper cervical spine: Unremarkable bone marrow signal. Sinuses/Orbits: Unremarkable orbits. Small mucous retention cyst in the left maxillary sinus. Clear mastoid air cells. Other: None. IMPRESSION: 1. Fourteen new subcentimeter brain metastases. No associated edema. 2. Stable to decreased size of numerous other metastases. Electronically Signed: By: Sebastian Ache M.D. 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 s /p second opinion at [facility]- Dr.Stinchcomb. status post 4 cycles of carbo Taxol-Atezo+Bev- JAN 25th, 2024-PET- Interval response to therapy with resolution of previously demonstrated extensive hypermetabolic activity throughout the liver.  PET scan April 17th- Unchanged appearance of chronic loculated left pleural effusion/fibrothorax with new hypermetabolic activity of the pleura and adjacent consolidations, findings may be due to pseudoprogression or progressive disease. New irregular areas of hypermetabolic activity in the liver associated with previously described metastatic liver lesions, findings may be due to pseudoprogression or progressive disease. April 21st- liver MRI-  showed positive response to therapy when compared to prior abdominal MRI in terms of regression of diffuse hepatic metastatic disease.  Much of these imaging findings may be attributable to developing areas of hepatic fibrosis at the site of treated lesions, however, given the hypermetabolism in these regions on the recent PET-CT, some degree of residual tumor is not excluded. No new lesions are identified.    # However clinically concerned about progression esp in liver- Discontinued Cycle #5  -Atezo + bev-given  clinical concerns of progression [extreme fatigue rising LFTs].   # currently on Alimta+ M-vasi s/p cyle #1 1 week ago.   # Elevated LFTs: Normal bilirubin-question related to immunotherapy versus progressive disease. No significant improvement noted.  Recommend continue Prednisone 80  mg x 2 weeks.  Again will reevaluate at next visit.  # Acute bronchitis-status post prednisone; improved.  # Nose bleeds: multifactorial- avastin+ platelets- 82; and on eliquis.  Recommend decreasing the dose of Eliquis.  And also recommend Vaseline.  # Unexplained severe fatigue-unclear etiology s/p prednisone as above improved.  Continue prednisone taper-as above monitor closely.  Discussed current American ginseng.  #Asymptomatic multiple brain mets subcentimeter asymptomatic-  -s/p SBRT SEP 2023- [[place]].  S/p SBRT [[place]]- FEB 12th, 2024. MAY 6th, 2024- MRI Brain-Fourteen new subcentimeter brain metastases. No associated edema. Stable to decreased size of numerous other metastases.  Discussed with Dr. Basilio Cairo.  S/p evaluation with neuro-oncology Dr. Verdie Shire whole brain radiation down the line.  Awaiting repeat MRI in July.  # bone metastases-Hypocalcemia: mild- continue calcium 1200 plus vitamin D-3 1000-over-the-counter-1 pill a day.  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 Currently On Eliquis-5 mg BID.; recommend eliquis 5 mg q day [given nose bleeds]  #Bone metastases- CT DEc 7th, 2022 to healing process.  Dental evaluation on 9/21.  Continue calcium vitamin D intake. stable.  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  # IV access/ port flush.   [Tuesday/wed]- pref B12 #1 on 5/16  # DISPOSITION:   # as per IS- Follow up in 2  weeks- MD; port-labs- cbc/cmp;  CEA; UA; Alimta+ M-vasi - Dr.B     All questions were answered. The patient knows to call the clinic with any problems, questions or concerns.    [name], MD 03/06/2023 10:43 AM

## 2023-03-06 NOTE — Assessment & Plan Note (Addendum)
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 s /p second opinion at [facility]- Dr.Stinchcomb. status post 4 cycles of carbo Taxol-Atezo+Bev- JAN 25th, 2024-PET- Interval response to therapy with resolution of previously demonstrated extensive hypermetabolic activity throughout the liver.  PET scan April 17th- Unchanged appearance of chronic loculated left pleural effusion/fibrothorax with new hypermetabolic activity of the pleura and adjacent consolidations, findings may be due to pseudoprogression or progressive disease. New irregular areas of hypermetabolic activity in the liver associated with previously described metastatic liver lesions, findings may be due to pseudoprogression or progressive disease. April 21st- liver MRI-  showed positive response to therapy when compared to prior abdominal MRI in terms of regression of diffuse hepatic metastatic disease.  Much of these imaging findings may be attributable to developing areas of hepatic fibrosis at the site of treated lesions, however, given the hypermetabolism in these regions on the recent PET-CT, some degree of residual tumor is not excluded. No new lesions are identified.    # However clinically concerned about progression esp in liver- Discontinued Cycle #5  -Atezo + bev-given clinical concerns of progression [extreme fatigue rising LFTs].   # currently on Alimta+ M-vasi s/p cyle #1 1 week ago.   # Elevated LFTs: Normal bilirubin-question related to immunotherapy versus progressive disease. No significant improvement noted.  Recommend continue Prednisone 80  mg x 2 weeks.  Again will reevaluate at next visit.  # Acute bronchitis-status post prednisone; improved.  # Nose bleeds: multifactorial- avastin+ platelets- 82; and on eliquis.  Recommend decreasing the dose of Eliquis.  And also recommend Vaseline.  # Unexplained severe fatigue-unclear etiology s/p prednisone as above improved.   Continue prednisone taper-as above monitor closely.  Discussed current American ginseng.  #Asymptomatic multiple brain mets subcentimeter asymptomatic-  -s/p SBRT SEP 2023- [[place]  S/p SBRT [[place]]- FEB 12th, 2024. MAY 6th, 2024- MRI Brain-Fourteen new subcentimeter brain metastases. No associated edema. Stable to decreased size of numerous other metastases.  Discussed with Dr. Basilio Cairo.  S/p evaluation with neuro-oncology Dr. Verdie Shire whole brain radiation down the line.  Awaiting repeat MRI in July.  # bone metastases-Hypocalcemia: mild- continue calcium 1200 plus vitamin D-3 1000-over-the-counter-1 pill a day.  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 Currently On Eliquis-5 mg BID.; recommend eliquis 5 mg q day [given nose bleeds]  #Bone metastases- CT DEc 7th, 2022 to healing process.  Dental evaluation on 9/21.  Continue calcium vitamin D intake. stable.  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  # IV access/ port flush.   [Tuesday/wed]- pref B12 #1 on 5/16  # DISPOSITION:  # as per IS- Follow up in 2  weeks- MD; port-labs- cbc/cmp; CEA; UA; Alimta+ M-vasi - Dr.B

## 2023-03-06 NOTE — Progress Notes (Signed)
C/o bruising and nose bleeds.

## 2023-03-18 ENCOUNTER — Encounter: Payer: Self-pay | Admitting: Internal Medicine

## 2023-03-18 ENCOUNTER — Encounter: Payer: Self-pay | Admitting: Nurse Practitioner

## 2023-03-20 ENCOUNTER — Inpatient Hospital Stay: Payer: Managed Care, Other (non HMO) | Attending: Internal Medicine | Admitting: Internal Medicine

## 2023-03-20 ENCOUNTER — Telehealth: Payer: Self-pay

## 2023-03-20 ENCOUNTER — Encounter: Payer: Self-pay | Admitting: Internal Medicine

## 2023-03-20 ENCOUNTER — Inpatient Hospital Stay: Payer: Managed Care, Other (non HMO)

## 2023-03-20 VITALS — BP 132/91 | HR 70 | Temp 97.2°F | Ht 71.0 in | Wt 225.0 lb

## 2023-03-20 DIAGNOSIS — J90 Pleural effusion, not elsewhere classified: Secondary | ICD-10-CM | POA: Diagnosis not present

## 2023-03-20 DIAGNOSIS — C3432 Malignant neoplasm of lower lobe, left bronchus or lung: Secondary | ICD-10-CM | POA: Diagnosis present

## 2023-03-20 DIAGNOSIS — C7951 Secondary malignant neoplasm of bone: Secondary | ICD-10-CM | POA: Diagnosis not present

## 2023-03-20 DIAGNOSIS — Z7901 Long term (current) use of anticoagulants: Secondary | ICD-10-CM | POA: Insufficient documentation

## 2023-03-20 DIAGNOSIS — Z86718 Personal history of other venous thrombosis and embolism: Secondary | ICD-10-CM | POA: Diagnosis not present

## 2023-03-20 DIAGNOSIS — R7989 Other specified abnormal findings of blood chemistry: Secondary | ICD-10-CM | POA: Insufficient documentation

## 2023-03-20 DIAGNOSIS — C787 Secondary malignant neoplasm of liver and intrahepatic bile duct: Secondary | ICD-10-CM | POA: Diagnosis not present

## 2023-03-20 DIAGNOSIS — Z5111 Encounter for antineoplastic chemotherapy: Secondary | ICD-10-CM | POA: Insufficient documentation

## 2023-03-20 DIAGNOSIS — C7931 Secondary malignant neoplasm of brain: Secondary | ICD-10-CM | POA: Insufficient documentation

## 2023-03-20 DIAGNOSIS — Z5112 Encounter for antineoplastic immunotherapy: Secondary | ICD-10-CM | POA: Insufficient documentation

## 2023-03-20 LAB — CBC WITH DIFFERENTIAL (CANCER CENTER ONLY)
Abs Immature Granulocytes: 1.34 10*3/uL — ABNORMAL HIGH (ref 0.00–0.07)
Basophils Absolute: 0.2 10*3/uL — ABNORMAL HIGH (ref 0.0–0.1)
Basophils Relative: 1 %
Eosinophils Absolute: 0.1 10*3/uL (ref 0.0–0.5)
Eosinophils Relative: 0 %
HCT: 46.7 % (ref 39.0–52.0)
Hemoglobin: 15.7 g/dL (ref 13.0–17.0)
Immature Granulocytes: 10 %
Lymphocytes Relative: 12 %
Lymphs Abs: 1.7 10*3/uL (ref 0.7–4.0)
MCH: 31.1 pg (ref 26.0–34.0)
MCHC: 33.6 g/dL (ref 30.0–36.0)
MCV: 92.5 fL (ref 80.0–100.0)
Monocytes Absolute: 1.2 10*3/uL — ABNORMAL HIGH (ref 0.1–1.0)
Monocytes Relative: 9 %
Neutro Abs: 9.4 10*3/uL — ABNORMAL HIGH (ref 1.7–7.7)
Neutrophils Relative %: 68 %
Platelet Count: 148 10*3/uL — ABNORMAL LOW (ref 150–400)
RBC: 5.05 MIL/uL (ref 4.22–5.81)
RDW: 17.5 % — ABNORMAL HIGH (ref 11.5–15.5)
Smear Review: NORMAL
WBC Count: 13.8 10*3/uL — ABNORMAL HIGH (ref 4.0–10.5)
nRBC: 0 % (ref 0.0–0.2)

## 2023-03-20 LAB — CMP (CANCER CENTER ONLY)
ALT: 202 U/L — ABNORMAL HIGH (ref 0–44)
AST: 78 U/L — ABNORMAL HIGH (ref 15–41)
Albumin: 3.3 g/dL — ABNORMAL LOW (ref 3.5–5.0)
Alkaline Phosphatase: 109 U/L (ref 38–126)
Anion gap: 6 (ref 5–15)
BUN: 17 mg/dL (ref 6–20)
CO2: 28 mmol/L (ref 22–32)
Calcium: 8.6 mg/dL — ABNORMAL LOW (ref 8.9–10.3)
Chloride: 105 mmol/L (ref 98–111)
Creatinine: 0.84 mg/dL (ref 0.61–1.24)
GFR, Estimated: >60 mL/min (ref >60)
Glucose, Bld: 86 mg/dL (ref 70–99)
Potassium: 3.7 mmol/L (ref 3.5–5.1)
Sodium: 139 mmol/L (ref 135–145)
Total Bilirubin: 0.8 mg/dL (ref 0.3–1.2)
Total Protein: 5.9 g/dL — ABNORMAL LOW (ref 6.5–8.1)

## 2023-03-20 LAB — URINALYSIS, DIPSTICK ONLY
Bilirubin Urine: NEGATIVE
Glucose, UA: NEGATIVE mg/dL
Hgb urine dipstick: NEGATIVE
Ketones, ur: 5 mg/dL — AB
Leukocytes,Ua: NEGATIVE
Nitrite: NEGATIVE
Protein, ur: NEGATIVE mg/dL
Specific Gravity, Urine: 1.027 (ref 1.005–1.030)
pH: 5 (ref 5.0–8.0)

## 2023-03-20 MED ORDER — PROCHLORPERAZINE MALEATE 10 MG PO TABS
10.0000 mg | ORAL_TABLET | Freq: Once | ORAL | Status: AC
  Administered 2023-03-20: 10 mg via ORAL
  Filled 2023-03-20: qty 1

## 2023-03-20 MED ORDER — PREDNISONE 20 MG PO TABS: ORAL_TABLET | ORAL | 0 refills | Status: DC

## 2023-03-20 MED ORDER — HEPARIN SOD (PORK) LOCK FLUSH 100 UNIT/ML IV SOLN
500.0000 [IU] | Freq: Once | INTRAVENOUS | Status: AC | PRN
  Administered 2023-03-20: 500 [IU]
  Filled 2023-03-20: qty 5

## 2023-03-20 MED ORDER — SODIUM CHLORIDE 0.9 % IV SOLN
Freq: Once | INTRAVENOUS | Status: AC
  Filled 2023-03-20: qty 250

## 2023-03-20 MED ORDER — SODIUM CHLORIDE 0.9 % IV SOLN
500.0000 mg/m2 | Freq: Once | INTRAVENOUS | Status: AC
  Administered 2023-03-20: 1100 mg via INTRAVENOUS
  Filled 2023-03-20: qty 40

## 2023-03-20 MED ORDER — SODIUM CHLORIDE 0.9 % IV SOLN
15.0000 mg/kg | Freq: Once | INTRAVENOUS | Status: AC
  Administered 2023-03-20: 1600 mg via INTRAVENOUS
  Filled 2023-03-20: qty 64

## 2023-03-20 MED ORDER — APIXABAN 2.5 MG PO TABS: 2.5000 mg | ORAL_TABLET | Freq: Two times a day (BID) | ORAL | 4 refills | Status: DC

## 2023-03-20 MED ORDER — SODIUM CHLORIDE 0.9% FLUSH
10.0000 mL | INTRAVENOUS | Status: DC | PRN
  Administered 2023-03-20: 10 mL via INTRAVENOUS
  Filled 2023-03-20: qty 10

## 2023-03-20 NOTE — Telephone Encounter (Signed)
Prior auth started thru covermymeds: ([employer]) Rx #: [id number] Eliquis 2.5MG  tablets

## 2023-03-20 NOTE — Patient Instructions (Signed)
#   Recommend prednisone taper-start taking prednisone 3 pills for 1 week; and then 2 pills for 1 week; and then 1 pill a day-do not stop until further directions.   # Stop Eliquis 5 mg twice daily; take any prescription of Eliquis 2.5 mg twice daily.

## 2023-03-20 NOTE — Telephone Encounter (Signed)
Approved  CaseId:[id number];Status:Approved;Review Type:Prior Auth;Coverage Start Date:03/20/2023;Coverage End Date:03/19/2024; Authorization Expiration Date: 03/18/2024

## 2023-03-20 NOTE — Patient Instructions (Signed)
[facility]  Discharge Instructions: Thank you for choosing [facility] to provide your oncology and hematology care.  If you have a lab appointment with the Cancer Center, please go directly to the Cancer Center and check in at the registration area.  Wear comfortable clothing and clothing appropriate for easy access to any Portacath or PICC line.   We strive to give you quality time with your provider. You may need to reschedule your appointment if you arrive late (15 or more minutes).  Arriving late affects you and other patients whose appointments are after yours.  Also, if you miss three or more appointments without notifying the office, you may be dismissed from the clinic at the provider's discretion.      For prescription refill requests, have your pharmacy contact our office and allow 72 hours for refills to be completed.     To help prevent nausea and vomiting after your treatment, we encourage you to take your nausea medication as directed.  BELOW ARE SYMPTOMS THAT SHOULD BE REPORTED IMMEDIATELY: *FEVER GREATER THAN 100.4 F (38 C) OR HIGHER *CHILLS OR SWEATING *NAUSEA AND VOMITING THAT IS NOT CONTROLLED WITH YOUR NAUSEA MEDICATION *UNUSUAL SHORTNESS OF BREATH *UNUSUAL BRUISING OR BLEEDING *URINARY PROBLEMS (pain or burning when urinating, or frequent urination) *BOWEL PROBLEMS (unusual diarrhea, constipation, pain near the anus) TENDERNESS IN MOUTH AND THROAT WITH OR WITHOUT PRESENCE OF ULCERS (sore throat, sores in mouth, or a toothache) UNUSUAL RASH, SWELLING OR PAIN  UNUSUAL VAGINAL DISCHARGE OR ITCHING   Items with * indicate a potential emergency and should be followed up as soon as possible or go to the Emergency Department if any problems should occur.  Please show the CHEMOTHERAPY ALERT CARD or IMMUNOTHERAPY ALERT CARD at check-in to the Emergency Department and triage nurse.  Should you have questions after your visit  or need to cancel or reschedule your appointment, please contact [facility]  [phone] and follow the prompts.  Office hours are 8:00 a.m. to 4:30 p.m. Monday - Friday. Please note that voicemails left after 4:00 p.m. may not be returned until the following business day.  We are closed weekends and major holidays. You have access to a nurse at all times for urgent questions. Please call the main number to the clinic [phone] and follow the prompts.  For any non-urgent questions, you may also contact your provider using MyChart. We now offer e-Visits for anyone 18 and older to request care online for non-urgent symptoms. For details visit [email or web address].   Also download the MyChart app! Go to the app store, search "MyChart", open the app, select [facility], and log in with your MyChart username and password.    

## 2023-03-20 NOTE — Progress Notes (Signed)
Pt states was dropped down to eliquis 5mg  qd instead of bid due to bleeding from his nose. He would like to know if he can stopped the blood thinner for now?

## 2023-03-20 NOTE — Assessment & Plan Note (Signed)
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 s /p second opinion at [facility]- Dr.Stinchcomb. status post 4 cycles of carbo Taxol-Atezo+Bev- JAN 25th, 2024-PET- Interval response to therapy with resolution of previously demonstrated extensive hypermetabolic activity throughout the liver.  PET scan April 17th- Unchanged appearance of chronic loculated left pleural effusion/fibrothorax with new hypermetabolic activity of the pleura and adjacent consolidations, findings may be due to pseudoprogression or progressive disease. New irregular areas of hypermetabolic activity in the liver associated with previously described metastatic liver lesions, findings may be due to pseudoprogression or progressive disease. April 21st- liver MRI-  showed positive response to therapy when compared to prior abdominal MRI in terms of regression of diffuse hepatic metastatic disease.  Much of these imaging findings may be attributable to developing areas of hepatic fibrosis at the site of treated lesions, however, given the hypermetabolism in these regions on the recent PET-CT, some degree of residual tumor is not excluded. No new lesions are identified.    # However clinically concerned about progression esp in liver- Discontinued Cycle #5  -Atezo + bev-given clinical concerns of progression [extreme fatigue rising LFTs].  # currently on Alimta+ M-vasi s/p cyle #1 appx 3 weeks ago.   # proceed with  Alimta+ M-vasi s/p cyle #2-  Will plan imaging based after the 3 cycles of chemo; will order at next visit. Labs-CBC/chemistries were reviewed with the patient.-  # Elevated LFTs: Normal bilirubin-question related to immunotherapy versus progressive disease.  Currently on prednisone 80  mg x 2 weeks-today LFTs slightly imrpoved- Recommend prednisone taper-start taking prednisone 3 pills for 1 week; and then 2 pills for 1 week; and then 1 pill a day-do not stop until further directions.    # Nose bleeds: multifactorial- avastin+ platelets-currently on low-dose of Eliquis given thrombocytopenia from chemotherapy.  Platelets improved however,  recommend restarting Eliquis at low-dose of 2.5 mg twice a day/given the expected nadir from chemotherapy.  # Unexplained severe fatigue-unclear etiology s/p prednisone as above improved.  Continue prednisone taper-as above monitor closely.  Discussed current American ginseng.  #Asymptomatic multiple brain mets subcentimeter asymptomatic-  -s/p SBRT SEP 2023- [[place]].  S/p SBRT [[place]]- FEB 12th, 2024. MAY 6th, 2024- MRI Brain-Fourteen new subcentimeter brain metastases. No associated edema. Stable to decreased size of numerous other metastases.  Discussed with Dr. Basilio Cairo.  S/p evaluation with neuro-oncology Dr. Verdie Shire whole brain radiation down the line.  Awaiting repeat MRI in July. Stable clinically.   # bone metastases-Hypocalcemia: mild- continue calcium 1200 plus vitamin D-3 1000-over-the-counter-1 pill a day.  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 Decrease the dose of Eliquis 2.5 mg BID.[given nose bleeds]  #Bone metastases- CT DEc 7th, 2022 to healing process.  Dental evaluation on 9/21.  Continue calcium vitamin D intake. stable.  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  # IV access/ port flush.   [Tuesday/wed]- pref B12 #1 on 5/16  # DISPOSITION:  # chemo today- Alimta+ M-vasi  # as per IS- Follow up in 3   weeks- MD; port-labs- cbc/cmp; CEA; UA; Alimta+ M-vasi; B12 injection - Dr.B

## 2023-03-20 NOTE — Progress Notes (Signed)
[facility] CONSULT NOTE  Patient Care Team: Pcp, No as PCP - General [name], RN as Oncology Nurse Navigator [name], MD as Consulting Physician (Internal Medicine)  CHIEF COMPLAINTS/PURPOSE OF CONSULTATION: Lung cancer  #  Oncology History Overview Note  IMPRESSION: 1. Patchy nodular fat stranding throughout the anterior left upper quadrant peritoneal fat, nonspecific, cannot exclude peritoneal carcinomatosis. Dedicated CT abdomen/pelvis with oral and IV contrast recommended for further evaluation. 2. Dense patchy consolidation replacing much of the left lower lung lobe, appearing masslike in the superior segment left lower lobe, with associated bulging of the left major fissure and associated left lower lobe volume loss. Fine nodularity throughout both lungs with an upper lobe predominance. Asymmetric left upper lobe interlobular septal thickening. These findings are indeterminate, with differential including multilobar pneumonia, sarcoidosis or a neoplastic process. The persistence on radiographs back to 01/20/2021 despite antibiotic therapy make sarcoidosis or a neoplastic process more likely. Pulmonology consultation suggested for consideration of bronchoscopic evaluation. 3. Small dependent left pleural effusion. 4. Mild mediastinal lymphadenopathy, nonspecific. 5. Subacute healing lateral right sixth rib fracture.   DIAGNOSIS:  A. LUNG, LEFT LOWER LOBE; ENB-ASSISTED BIOPSY:  - NON-SMALL CELL CARCINOMA, FAVOR ADENOCARCINOMA.  - FOREIGN MATERIAL SUGGESTIVE OF ASPIRATION.   # EGFR MUTATED: 60 del- June 28th, 2022- [name]. [limited]  # Asymptomatic multiple brain mets subcentimeter asymptomatic -JUNE 29th, 2023- Numerous metastatic lesions in the supratentorial brain-increase in number also conspicuity compared to January 2023.  Pre-SBRT MRI brain AUG 18th, 2023-  Eighteen small enhancing brain metastases (punctate to 11 mm); were all  present on 03/28/2021-s/p SBRT SEP 2023- [[place]]; SRS/ radiation treatment for brain metastasis He finished treatment on 11-26-22.   # SEP-OCT-worsening left-sided pleural effusion-status post paracentesis; exudative; Cytology negative- ?  Progressive disease.  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  While on osimertinib- NOV 1st, 2023- PET scan- shows progressive disease in liver. Liquid Biopsy/Guardant testing-positive for EGFR mutation; otherwise Negative for any novel mutations.  Repeat liver Biopsy positive for adenocarcinoma. NEGATIVE for MET amplification.  S/p second opinion at [facility]- Dr.Stinchcomb. status post 4 cycles of carbo Taxol-Atezo+Bev- JAN 25th, 2024-PET- Interval response to therapy with resolution of previously demonstrated extensive hypermetabolic activity throughout the liver. PARIL 2024- ? Partial response, but clinically worse/rising LFTs  # MAY 6th, Brain MRI-progressive multiple brain metastases  # MAY 16th, 2024- Alimta+Avastin #1  # MAY 16th, 2024-    Cancer of lower lobe of left lung (HCC)  03/23/2021 Initial Diagnosis   Cancer of lower lobe of left lung (HCC)   03/29/2021 Cancer Staging   Staging form: Lung, AJCC 8th Edition - Clinical: Stage IVB (cT3, cN3, pM1c) - Signed by [name], MD on 03/29/2021   03/30/2021 - 03/30/2021 Chemotherapy   Patient is on Treatment Plan : LUNG NSCLC Pemetrexed (Alimta) / Carboplatin q21d x 1 cycles     08/17/2022 - 01/15/2023 Chemotherapy   Patient is on Treatment Plan : LUNG Atezolizumab + Bevacizumab + Carboplatin + Paclitaxel q21d Induction x 4 cycles / Atezolizumab + Bevacizumab q21d Maintenance     02/28/2023 -  Chemotherapy   Patient is on Treatment Plan : LUNG NON-SMALL CELL Bevacizumab + Pemetrexed q21d      HISTORY OF PRESENTING ILLNESS: Ambulating independently.  Accompanied by his wife.   [name] 42 y.o.   male patient with stage IV lung cancer adenocarcinoma-brain mets [synchronous]  bone mets EGFR mutated -currently on Alimta- Bev is here for follow-up.  Patient currently on Alimta+ M-vasi s/p cycle #1 approximately 3 week ago.  Also currently on prednisone for possible hepatitis from immunotherapy.  Pt states was dropped down to eliquis 5mg  qd instead of bid due to bleeding from his nose.  Continues to have  nosebleeds/easy bruising.   Shortness of breath coughing improved.  Denies any abdominal pain nausea vomiting. Denies any worsening headaches. No worsening pain.   No worsening skin rash.   Review of Systems  Constitutional:  Positive for malaise/fatigue. Negative for chills, diaphoresis and fever.  HENT:  Negative for nosebleeds and sore throat.   Eyes:  Negative for double vision.  Respiratory:  Positive for cough. Negative for wheezing.   Cardiovascular:  Negative for chest pain, palpitations, orthopnea and leg swelling.  Gastrointestinal:  Positive for heartburn and nausea. Negative for abdominal pain, blood in stool, diarrhea and melena.  Genitourinary:  Negative for dysuria, frequency and urgency.  Musculoskeletal:  Negative for back pain and joint pain.  Skin:  Negative for itching.  Neurological:  Negative for tingling and focal weakness.  Endo/Heme/Allergies:  Does not bruise/bleed easily.  Psychiatric/Behavioral:  Negative for depression. The patient is not nervous/anxious and does not have insomnia.      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    SURGICAL HISTORY: Past Surgical History:  Procedure Laterality Date   FRACTURE SURGERY     broke femur when he was 6 years   PORTA CATH INSERTION N/A 03/28/2021   Procedure: PORTA CATH INSERTION;  Surgeon: Renford Dills, MD;  Location: [facility] INVASIVE CV LAB;  Service: Cardiovascular;   Laterality: N/A;   VIDEO BRONCHOSCOPY WITH ENDOBRONCHIAL NAVIGATION N/A 03/15/2021   Procedure: VIDEO BRONCHOSCOPY WITH ENDOBRONCHIAL NAVIGATION;  Surgeon: Vida Rigger, MD;  Location: [facility] ORS;  Service: Thoracic;  Laterality: N/A;   VIDEO BRONCHOSCOPY WITH ENDOBRONCHIAL ULTRASOUND N/A 03/15/2021   Procedure: VIDEO BRONCHOSCOPY WITH ENDOBRONCHIAL ULTRASOUND;  Surgeon: Vida Rigger, MD;  Location: [facility] ORS;  Service: Thoracic;  Laterality: N/A;    SOCIAL HISTORY: Social History   Socioeconomic History   Marital status: Married    Spouse name: Darl Pikes   Number of children: 2   Years of education: Not on file   Highest education level: Not on file  Occupational History   Not on file  Tobacco Use   Smoking status: Never   Smokeless tobacco: Former    Types: Snuff, Chew  Vaping Use   Vaping Use: Never used  Substance and Sexual Activity   Alcohol use: Never   Drug use: Never   Sexual activity: Yes  Other Topics Concern   Not on file  Social History Narrative   [place]; with wife; 2 daughters[12 and 22]; never smoked; rare alcohol. 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 Dog and cat.   Social Determinants of Health   Financial Resource Strain: Low Risk  (05/10/2022)   Overall Financial Resource Strain (CARDIA)    Difficulty of Paying Living Expenses: Not hard at all  Food Insecurity: No Food Insecurity (05/10/2022)   Hunger Vital Sign    Worried About Running Out of Food in the Last Year: Never true    Ran Out of Food in the Last Year: Never true  Transportation Needs: No Transportation Needs (03/16/2022)  PRAPARE - Administrator, Civil Service (Medical): No    Lack of Transportation (Non-Medical): No  Physical Activity: Not on file  Stress: Not on file  Social Connections: Socially Integrated (05/10/2022)   Social Connection and Isolation Panel [NHANES]    Frequency of Communication with Friends and Family: More than three times a week    Frequency of Social Gatherings with  Friends and Family: More than three times a week    Attends Religious Services: More than 4 times per year    Active Member of [employer] or Organizations: Yes    Attends Engineer, structural: More than 4 times per year    Marital Status: Married  Catering manager Violence: Not on file    FAMILY HISTORY: Family History  Problem Relation Age of Onset   High Cholesterol Mother    Hypertension Father    Lung cancer Father    Skin cancer Father     ALLERGIES:  is allergic to codeine.  MEDICATIONS:  Current Outpatient Medications  Medication Sig Dispense Refill   albuterol (VENTOLIN HFA) 108 (90 Base) MCG/ACT inhaler Inhale 2 puffs into the lungs every 6 (six) hours as needed for wheezing or shortness of breath. 8 g 2   apixaban (ELIQUIS) 2.5 MG TABS tablet Take 1 tablet (2.5 mg total) by mouth 2 (two) times daily. 60 tablet 4   calcium carbonate (TUMS EX) 750 MG chewable tablet Chew 1 tablet by mouth daily.     ergocalciferol (VITAMIN D2) 1.25 MG (50000 UT) capsule Take 1 capsule (50,000 Units total) by mouth once a week. 12 capsule 1   folic acid (FOLVITE) 1 MG tablet TAKE 1 TABLET BY MOUTH EVERY DAY 90 tablet 1   lidocaine-prilocaine (EMLA) cream Apply 1 Application topically as needed. 30 g 0   metoprolol succinate (TOPROL-XL) 50 MG 24 hr tablet Take 50 mg by mouth daily. Take with or immediately following a meal.     sertraline (ZOLOFT) 50 MG tablet Take 1 tablet (50 mg total) by mouth daily. 30 tablet 1   predniSONE (DELTASONE) 20 MG tablet Recommend prednisone taper-start taking prednisone 3 pills for 1 week; and then 2 pills for 1 week; and then 1 pill a day-do not stop until further directions.  Take with breakfast daily. 100 tablet 0   No current facility-administered medications for this visit.   Facility-Administered Medications Ordered in Other Visits  Medication Dose Route Frequency Provider Last Rate Last Admin   heparin lock flush 100 UNIT/ML injection             heparin lock flush 100 UNIT/ML injection            sodium chloride flush (NS) 0.9 % injection 10 mL  10 mL Intravenous PRN Louretta Shorten R, MD   10 mL at 07/28/21 0852   sodium chloride flush (NS) 0.9 % injection 10 mL  10 mL Intravenous PRN [name], MD   10 mL at 03/20/23 0806      .  PHYSICAL EXAMINATION: ECOG PERFORMANCE STATUS: 1 - Symptomatic but completely ambulatory  Vitals:   03/20/23 0801  BP: (!) 132/91  Pulse: 70  Temp: (!) 97.2 F (36.2 C)  SpO2: 96%       Filed Weights   03/20/23 0801  Weight: 225 lb (102.1 kg)      Left cheek papular lesion noted.  No signs of infection.  Physical Exam HENT:     Head: Normocephalic and atraumatic.  Mouth/Throat:     Pharynx: No oropharyngeal exudate.  Eyes:     Pupils: Pupils are equal, round, and reactive to light.  Cardiovascular:     Rate and Rhythm: Normal rate and regular rhythm.  Pulmonary:     Effort: No respiratory distress.     Breath sounds: No wheezing.     Comments: Decreased breath sound on the left side compared to right. Abdominal:     General: Bowel sounds are normal. There is no distension.     Palpations: Abdomen is soft. There is no mass.     Tenderness: There is no abdominal tenderness. There is no guarding or rebound.  Musculoskeletal:        General: No tenderness. Normal range of motion.     Cervical back: Normal range of motion and neck supple.  Skin:    General: Skin is warm.  Neurological:     Mental Status: He is alert and oriented to person, place, and time.  Psychiatric:        Mood and Affect: Affect normal.    Fungal dermatitis bilateral buttocks groin/underneath abdominal pannus  LABORATORY DATA:  I have reviewed the data as listed Lab Results  Component Value Date   WBC 13.8 (H) 03/20/2023   HGB 15.7 03/20/2023   HCT 46.7 03/20/2023   MCV 92.5 03/20/2023   PLT 148 (L) 03/20/2023   Recent Labs    02/28/23 0927 03/06/23 0940 03/20/23 0801   NA 139 136 139  K 3.6 3.4* 3.7  CL 106 104 105  CO2 26 24 28   GLUCOSE 103* 122* 86  BUN 19 20 17   CREATININE 0.89 0.68 0.84  CALCIUM 8.5* 7.9* 8.6*  GFRNONAA >60 >60 >60  PROT 6.5 5.9* 5.9*  ALBUMIN 3.5 3.2* 3.3*  AST 66* 116* 78*  ALT 138* 215* 202*  ALKPHOS 128* 122 109  BILITOT 0.9 2.0* 0.8    RADIOGRAPHIC STUDIES: I have personally reviewed the radiological images as listed and agreed with the findings in the report. MR Brain W Wo Contrast  Addendum Date: 02/25/2023   ADDENDUM REPORT: 02/25/2023 07:56 ADDENDUM: 12 mm hazy lesion at the medial right temporal lobe on 14:64, intervally stable but a nontreated metastatic focus as discussed in tumor board this morning. Electronically Signed   By: Tiburcio Pea M.D.   On: 02/25/2023 07:56   Result Date: 02/25/2023 CLINICAL DATA:  Brain metastases, assess treatment response. Lung cancer. EXAM: MRI HEAD WITHOUT AND WITH CONTRAST TECHNIQUE: Multiplanar, multiecho pulse sequences of the brain and surrounding structures were obtained without and with intravenous contrast. CONTRAST:  10 mL of Vueway COMPARISON:  Head MRI 11/15/2022 FINDINGS: Brain: Image references are to series 13. New lesions: 1. 2 mm inferior left cerebellum (image 16). 2. 5 mm inferior left cerebellum (image 17). 3. 3 mm mid left cerebellum (image 32). 4. 2 mm cerebellar vermis (image 45). 5. 3 mm right occipital lobe (image 54). 6. 2 mm lateral right frontal lobe (image 110). 7. 3 mm medial right frontal lobe (image 111). 8. 4 mm medial right parietal lobe (image 114). 9. 3 mm posterolateral right parietal lobe (image 115). 10. 4 mm posterior left frontal lobe, located lateral to a previously treated lesion (image 116). 11. 3 mm posteromedial right frontal lobe (image 132). 12. 7 mm posteromedial right frontal lobe (image 133). 13. 3 mm left middle frontal gyrus (image 135). 14. 4 mm right precentral gyrus (image 142). Multiple other small lesions are either stable or smaller  than on the prior study with some lesions no longer being visible. Stable or smaller visible lesions are annotated with single arrows on series 13. Scattered chronic blood products are again noted associated with multiple lesions. There is no significant associated edema. Minimal T2 hyperintensity is again noted associated with treated lesions in the left precentral gyrus. No acute infarct, midline shift, hydrocephalus, or extra-axial fluid collection is evident. Vascular: Major intracranial vascular flow voids are preserved. Skull and upper cervical spine: Unremarkable bone marrow signal. Sinuses/Orbits: Unremarkable orbits. Small mucous retention cyst in the left maxillary sinus. Clear mastoid air cells. Other: None. IMPRESSION: 1. Fourteen new subcentimeter brain metastases. No associated edema. 2. Stable to decreased size of numerous other metastases. Electronically Signed: By: Sebastian Ache M.D. 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 s /p second opinion at [facility]- Dr.Stinchcomb. status post 4 cycles of carbo Taxol-Atezo+Bev- JAN 25th, 2024-PET- Interval response to therapy with resolution of previously demonstrated extensive hypermetabolic activity throughout the liver.  PET scan April 17th- Unchanged appearance of chronic loculated left pleural effusion/fibrothorax with new hypermetabolic activity of the pleura and adjacent consolidations, findings may be due to pseudoprogression or progressive disease. New irregular areas of hypermetabolic activity in the liver associated with previously described metastatic liver lesions, findings may be due to pseudoprogression or progressive disease. April 21st- liver MRI-  showed positive response to therapy when compared to prior abdominal MRI in terms of regression of diffuse hepatic metastatic disease.  Much of these  imaging findings may be attributable to developing areas of hepatic fibrosis at the site of treated lesions, however, given the hypermetabolism in these regions on the recent PET-CT, some degree of residual tumor is not excluded. No new lesions are identified.    # However clinically concerned about progression esp in liver- Discontinued Cycle #5  -Atezo + bev-given clinical concerns of progression [extreme fatigue rising LFTs].  # currently on Alimta+ M-vasi s/p cyle #1 appx 3 weeks ago.   # proceed with  Alimta+ M-vasi s/p cyle #2-  Will plan imaging based after the 3 cycles of chemo; will order at next visit. Labs-CBC/chemistries were reviewed with the patient.-  # Elevated LFTs: Normal bilirubin-question related to immunotherapy versus progressive disease.  Currently on prednisone 80  mg x 2 weeks-today LFTs slightly imrpoved- Recommend prednisone taper-start taking prednisone 3 pills for 1 week; and then 2 pills for 1 week; and then 1 pill a day-do not stop until further directions.   # Nose bleeds: multifactorial- avastin+ platelets-currently on low-dose of Eliquis given thrombocytopenia from chemotherapy.  Platelets improved however,  recommend restarting Eliquis at low-dose of 2.5 mg twice a day/given the expected nadir from chemotherapy.  # Unexplained severe fatigue-unclear etiology s/p prednisone as above improved.  Continue prednisone taper-as above monitor closely.  Discussed current American ginseng.  #Asymptomatic multiple brain mets subcentimeter asymptomatic-  -s/p SBRT SEP 2023- [[place]].  S/p SBRT [[place]]- FEB 12th, 2024. MAY 6th, 2024- MRI Brain-Fourteen new subcentimeter brain metastases. No associated edema. Stable to decreased size of numerous other metastases.  Discussed with Dr. Basilio Cairo.  S/p evaluation with neuro-oncology Dr. Verdie Shire whole brain radiation down the line.  Awaiting repeat MRI in July. Stable clinically.   # bone metastases-Hypocalcemia: mild- continue calcium  1200 plus vitamin D-3 1000-over-the-counter-1 pill a day.  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 Decrease the dose of Eliquis 2.5 mg BID.[given nose bleeds]  #Bone metastases- CT DEc 7th, 2022 to healing process.  Dental evaluation on 9/21.  Continue calcium vitamin D intake. stable.  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  # IV access/ port flush.   [Tuesday/wed]- pref B12 #1 on 5/16  # DISPOSITION:  # chemo today- Alimta+ M-vasi  # as per IS- Follow up in 3   weeks- MD; port-labs- cbc/cmp; CEA; UA; Alimta+ M-vasi; B12 injection - Dr.B     All questions were answered. The patient knows to call the clinic with any problems, questions or concerns.    [name], MD 03/20/2023 8:57 AM

## 2023-03-22 LAB — CEA: CEA: 18.1 ng/mL — ABNORMAL HIGH (ref 0.0–4.7)

## 2023-03-27 ENCOUNTER — Other Ambulatory Visit: Payer: Self-pay | Admitting: *Deleted

## 2023-03-27 MED ORDER — SERTRALINE HCL 50 MG PO TABS: 50.0000 mg | ORAL_TABLET | Freq: Every day | ORAL | 1 refills | Status: DC

## 2023-04-05 ENCOUNTER — Other Ambulatory Visit: Payer: Self-pay

## 2023-04-05 DIAGNOSIS — C3432 Malignant neoplasm of lower lobe, left bronchus or lung: Secondary | ICD-10-CM

## 2023-04-09 ENCOUNTER — Encounter: Payer: Self-pay | Admitting: Internal Medicine

## 2023-04-09 ENCOUNTER — Inpatient Hospital Stay: Payer: Managed Care, Other (non HMO)

## 2023-04-09 ENCOUNTER — Inpatient Hospital Stay (HOSPITAL_BASED_OUTPATIENT_CLINIC_OR_DEPARTMENT_OTHER): Payer: Managed Care, Other (non HMO) | Admitting: Internal Medicine

## 2023-04-09 VITALS — BP 137/98 | HR 71 | Temp 97.1°F | Ht 71.0 in | Wt 233.0 lb

## 2023-04-09 DIAGNOSIS — C3432 Malignant neoplasm of lower lobe, left bronchus or lung: Secondary | ICD-10-CM

## 2023-04-09 DIAGNOSIS — Z5112 Encounter for antineoplastic immunotherapy: Secondary | ICD-10-CM | POA: Diagnosis not present

## 2023-04-09 LAB — CBC WITH DIFFERENTIAL (CANCER CENTER ONLY)
Abs Immature Granulocytes: 1.04 10*3/uL — ABNORMAL HIGH (ref 0.00–0.07)
Basophils Absolute: 0 10*3/uL (ref 0.0–0.1)
Basophils Relative: 0 %
Eosinophils Absolute: 0 10*3/uL (ref 0.0–0.5)
Eosinophils Relative: 0 %
HCT: 44 % (ref 39.0–52.0)
Hemoglobin: 14.5 g/dL (ref 13.0–17.0)
Immature Granulocytes: 10 %
Lymphocytes Relative: 14 %
Lymphs Abs: 1.5 10*3/uL (ref 0.7–4.0)
MCH: 31.1 pg (ref 26.0–34.0)
MCHC: 33 g/dL (ref 30.0–36.0)
MCV: 94.4 fL (ref 80.0–100.0)
Monocytes Absolute: 0.9 10*3/uL (ref 0.1–1.0)
Monocytes Relative: 8 %
Neutro Abs: 7.1 10*3/uL (ref 1.7–7.7)
Neutrophils Relative %: 68 %
Platelet Count: 103 10*3/uL — ABNORMAL LOW (ref 150–400)
RBC: 4.66 MIL/uL (ref 4.22–5.81)
RDW: 19.8 % — ABNORMAL HIGH (ref 11.5–15.5)
Smear Review: NORMAL
WBC Count: 10.6 10*3/uL — ABNORMAL HIGH (ref 4.0–10.5)
WBC Morphology: ABNORMAL
nRBC: 0 % (ref 0.0–0.2)

## 2023-04-09 LAB — CMP (CANCER CENTER ONLY)
ALT: 197 U/L — ABNORMAL HIGH (ref 0–44)
AST: 85 U/L — ABNORMAL HIGH (ref 15–41)
Albumin: 3.3 g/dL — ABNORMAL LOW (ref 3.5–5.0)
Alkaline Phosphatase: 127 U/L — ABNORMAL HIGH (ref 38–126)
Anion gap: 8 (ref 5–15)
BUN: 19 mg/dL (ref 6–20)
CO2: 26 mmol/L (ref 22–32)
Calcium: 8.2 mg/dL — ABNORMAL LOW (ref 8.9–10.3)
Chloride: 104 mmol/L (ref 98–111)
Creatinine: 0.92 mg/dL (ref 0.61–1.24)
GFR, Estimated: >60 mL/min (ref >60)
Glucose, Bld: 131 mg/dL — ABNORMAL HIGH (ref 70–99)
Potassium: 3.7 mmol/L (ref 3.5–5.1)
Sodium: 138 mmol/L (ref 135–145)
Total Bilirubin: 1 mg/dL (ref 0.3–1.2)
Total Protein: 5.8 g/dL — ABNORMAL LOW (ref 6.5–8.1)

## 2023-04-09 LAB — PROTEIN, URINE, RANDOM: Total Protein, Urine: 13 mg/dL

## 2023-04-09 MED ORDER — SODIUM CHLORIDE 0.9 % IV SOLN
Freq: Once | INTRAVENOUS | Status: AC
  Filled 2023-04-09: qty 250

## 2023-04-09 MED ORDER — PROCHLORPERAZINE MALEATE 10 MG PO TABS
10.0000 mg | ORAL_TABLET | Freq: Once | ORAL | Status: AC
  Administered 2023-04-09: 10 mg via ORAL
  Filled 2023-04-09: qty 1

## 2023-04-09 MED ORDER — CYANOCOBALAMIN 1000 MCG/ML IJ SOLN
1000.0000 ug | Freq: Once | INTRAMUSCULAR | Status: AC
  Administered 2023-04-09: 1000 ug via INTRAMUSCULAR
  Filled 2023-04-09: qty 1

## 2023-04-09 MED ORDER — SODIUM CHLORIDE 0.9% FLUSH
10.0000 mL | INTRAVENOUS | Status: DC | PRN
  Administered 2023-04-09: 10 mL
  Filled 2023-04-09: qty 10

## 2023-04-09 MED ORDER — HEPARIN SOD (PORK) LOCK FLUSH 100 UNIT/ML IV SOLN
500.0000 [IU] | Freq: Once | INTRAVENOUS | Status: AC | PRN
  Administered 2023-04-09: 500 [IU]
  Filled 2023-04-09: qty 5

## 2023-04-09 MED ORDER — SODIUM CHLORIDE 0.9 % IV SOLN
15.0000 mg/kg | Freq: Once | INTRAVENOUS | Status: AC
  Administered 2023-04-09: 1600 mg via INTRAVENOUS
  Filled 2023-04-09: qty 64

## 2023-04-09 MED ORDER — SODIUM CHLORIDE 0.9 % IV SOLN
500.0000 mg/m2 | Freq: Once | INTRAVENOUS | Status: AC
  Administered 2023-04-09: 1100 mg via INTRAVENOUS
  Filled 2023-04-09: qty 44

## 2023-04-09 NOTE — Progress Notes (Signed)
C/o of eyes itching and burning x2 days.  C/o nose bleeds.

## 2023-04-09 NOTE — Assessment & Plan Note (Addendum)
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 s /p second opinion at [facility]- Dr.Stinchcomb. status post 4 cycles of carbo Taxol-Atezo+Bev- JAN 25th, 2024-PET- Interval response to therapy with resolution of previously demonstrated extensive hypermetabolic activity throughout the liver.  PET scan April 17th- Unchanged appearance of chronic loculated left pleural effusion/fibrothorax with new hypermetabolic activity of the pleura and adjacent consolidations, findings may be due to pseudoprogression or progressive disease. New irregular areas of hypermetabolic activity in the liver associated with previously described metastatic liver lesions, findings may be due to pseudoprogression or progressive disease. April 21st- liver MRI-  showed positive response to therapy when compared to prior abdominal MRI in terms of regression of diffuse hepatic metastatic disease.  Much of these imaging findings may be attributable to developing areas of hepatic fibrosis at the site of treated lesions, however, given the hypermetabolism in these regions on the recent PET-CT, some degree of residual tumor is not excluded. No new lesions are identified.   However clinically concerned about progression esp in liver- Discontinued Cycle #5  -Atezo + bev-given clinical concerns of progression [extreme fatigue rising LFTs].  # currently on Alimta+ M-vasi.   # proceed with  Alimta+ M-vasi s/p cyle #3.   Will plan imaging based after the 3 cycles of chemo; will order PET sacn today. Labs-CBC/chemistries were reviewed with the patient.   # Elevated LFTs: Normal bilirubin-question related to immunotherapy versus progressive disease.  Currently on prednisone 20 mg - LFTs slightly imrpoved- Continue taking prednisone  1 pill a day 1 week; half a pill a day for 1 week; and then half a pill every other day-do not stop until further directions.   # Nose bleeds: multifactorial- avastin+  platelets-currently on low-dose of Eliquis given thrombocytopenia from chemotherapy.  Platelets improved however,  continue Eliquis at low-dose of 2.5 mg twice a day; if worse- then recommend 1/2 dose of eliquis.   # Unexplained severe fatigue-unclear etiology s/p prednisone as above improved.  Continue prednisone taper-as above monitor closely.  Discussed current American ginseng.  # Eye burning/tearing- recommend Patanol eye drop BID.   #Asymptomatic multiple brain mets subcentimeter asymptomatic-  -s/p SBRT SEP 2023- [[place]].  S/p SBRT [[place]]- FEB 12th, 2024. MAY 6th, 2024- MRI Brain-Fourteen new subcentimeter brain metastases. No associated edema. Stable to decreased size of numerous other metastases.  Discussed with Dr. Basilio Cairo.  S/p evaluation with neuro-oncology Dr. Verdie Shire whole brain radiation down the line.  Awaiting repeat MRI in July. Stable clinically.   # bone metastases-Hypocalcemia: mild- continue calcium 1200 plus vitamin D-3 1000-over-the-counter-1 pill a day.  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 Decrease the dose of Eliquis 2.5 mg BID.[given nose bleeds]  #Bone metastases- CT DEc 7th, 2022 to healing process.  Dental evaluation on 9/21.  Continue calcium vitamin D intake. 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   [Tuesday/wed]- pref B12 #1 on 6/25  # DISPOSITION:  # chemo today- Alimta+ M-vasi; B12 injection # as per IS- Follow up in 3  weeks- MD; port-labs- cbc/cmp; CEA; UA; Alimta+ M-vasi; PET scan-- Dr.B

## 2023-04-09 NOTE — Patient Instructions (Addendum)
#   Continue taking prednisone 1 pill a day 1 week; half a pill a day for 1 week; and then half a pill every other day-do not stop until further directions.   # Patanol eye drop BID.

## 2023-04-09 NOTE — Patient Instructions (Signed)
[facility]  Discharge Instructions: Thank you for choosing [facility] to provide your oncology and hematology care.  If you have a lab appointment with the Cancer Center, please go directly to the Cancer Center and check in at the registration area.  Wear comfortable clothing and clothing appropriate for easy access to any Portacath or PICC line.   We strive to give you quality time with your provider. You may need to reschedule your appointment if you arrive late (15 or more minutes).  Arriving late affects you and other patients whose appointments are after yours.  Also, if you miss three or more appointments without notifying the office, you may be dismissed from the clinic at the provider's discretion.      For prescription refill requests, have your pharmacy contact our office and allow 72 hours for refills to be completed.    Today you received the following chemotherapy and/or immunotherapy agents alimta, avastin      To help prevent nausea and vomiting after your treatment, we encourage you to take your nausea medication as directed.  BELOW ARE SYMPTOMS THAT SHOULD BE REPORTED IMMEDIATELY: *FEVER GREATER THAN 100.4 F (38 C) OR HIGHER *CHILLS OR SWEATING *NAUSEA AND VOMITING THAT IS NOT CONTROLLED WITH YOUR NAUSEA MEDICATION *UNUSUAL SHORTNESS OF BREATH *UNUSUAL BRUISING OR BLEEDING *URINARY PROBLEMS (pain or burning when urinating, or frequent urination) *BOWEL PROBLEMS (unusual diarrhea, constipation, pain near the anus) TENDERNESS IN MOUTH AND THROAT WITH OR WITHOUT PRESENCE OF ULCERS (sore throat, sores in mouth, or a toothache) UNUSUAL RASH, SWELLING OR PAIN  UNUSUAL VAGINAL DISCHARGE OR ITCHING   Items with * indicate a potential emergency and should be followed up as soon as possible or go to the Emergency Department if any problems should occur.  Please show the CHEMOTHERAPY ALERT CARD or IMMUNOTHERAPY ALERT CARD at  check-in to the Emergency Department and triage nurse.  Should you have questions after your visit or need to cancel or reschedule your appointment, please contact [facility]  [phone] and follow the prompts.  Office hours are 8:00 a.m. to 4:30 p.m. Monday - Friday. Please note that voicemails left after 4:00 p.m. may not be returned until the following business day.  We are closed weekends and major holidays. You have access to a nurse at all times for urgent questions. Please call the main number to the clinic [phone] and follow the prompts.  For any non-urgent questions, you may also contact your provider using MyChart. We now offer e-Visits for anyone 67 and older to request care online for non-urgent symptoms. For details visit [email or web address].   Also download the MyChart app! Go to the app store, search "MyChart", open the app, select [facility], and log in with your MyChart username and password.

## 2023-04-09 NOTE — Progress Notes (Signed)
[facility] CONSULT NOTE  Patient Care Team: Pcp, No as PCP - General [name], RN as Oncology Nurse Navigator [name], MD as Consulting Physician (Internal Medicine)  CHIEF COMPLAINTS/PURPOSE OF CONSULTATION: Lung cancer  #  Oncology History Overview Note  IMPRESSION: 1. Patchy nodular fat stranding throughout the anterior left upper quadrant peritoneal fat, nonspecific, cannot exclude peritoneal carcinomatosis. Dedicated CT abdomen/pelvis with oral and IV contrast recommended for further evaluation. 2. Dense patchy consolidation replacing much of the left lower lung lobe, appearing masslike in the superior segment left lower lobe, with associated bulging of the left major fissure and associated left lower lobe volume loss. Fine nodularity throughout both lungs with an upper lobe predominance. Asymmetric left upper lobe interlobular septal thickening. These findings are indeterminate, with differential including multilobar pneumonia, sarcoidosis or a neoplastic process. The persistence on radiographs back to 01/20/2021 despite antibiotic therapy make sarcoidosis or a neoplastic process more likely. Pulmonology consultation suggested for consideration of bronchoscopic evaluation. 3. Small dependent left pleural effusion. 4. Mild mediastinal lymphadenopathy, nonspecific. 5. Subacute healing lateral right sixth rib fracture.   DIAGNOSIS:  A. LUNG, LEFT LOWER LOBE; ENB-ASSISTED BIOPSY:  - NON-SMALL CELL CARCINOMA, FAVOR ADENOCARCINOMA.  - FOREIGN MATERIAL SUGGESTIVE OF ASPIRATION.   # EGFR MUTATED: 60 del- June 28th, 2022- [name]. [limited]  # Asymptomatic multiple brain mets subcentimeter asymptomatic -JUNE 29th, 2023- Numerous metastatic lesions in the supratentorial brain-increase in number also conspicuity compared to January 2023.  Pre-SBRT MRI brain AUG 18th, 2023-  Eighteen small enhancing brain metastases (punctate to 11 mm); were all  present on 03/28/2021-s/p SBRT SEP 2023- [[place]]; SRS/ radiation treatment for brain metastasis He finished treatment on 11-26-22.   # SEP-OCT-worsening left-sided pleural effusion-status post paracentesis; exudative; Cytology negative- ?  Progressive disease.  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  While on osimertinib- NOV 1st, 2023- PET scan- shows progressive disease in liver. Liquid Biopsy/Guardant testing-positive for EGFR mutation; otherwise Negative for any novel mutations.  Repeat liver Biopsy positive for adenocarcinoma. NEGATIVE for MET amplification.  S/p second opinion at [facility]- Dr.Stinchcomb. status post 4 cycles of carbo Taxol-Atezo+Bev- JAN 25th, 2024-PET- Interval response to therapy with resolution of previously demonstrated extensive hypermetabolic activity throughout the liver. PARIL 2024- ? Partial response, but clinically worse/rising LFTs  # MAY 6th, Brain MRI-progressive multiple brain metastases  # MAY 16th, 2024- Alimta+Avastin #1  # MAY 16th, 2024-    Cancer of lower lobe of left lung (HCC)  03/23/2021 Initial Diagnosis   Cancer of lower lobe of left lung (HCC)   03/29/2021 Cancer Staging   Staging form: Lung, AJCC 8th Edition - Clinical: Stage IVB (cT3, cN3, pM1c) - Signed by [name], MD on 03/29/2021   03/30/2021 - 03/30/2021 Chemotherapy   Patient is on Treatment Plan : LUNG NSCLC Pemetrexed (Alimta) / Carboplatin q21d x 1 cycles     08/17/2022 - 01/15/2023 Chemotherapy   Patient is on Treatment Plan : LUNG Atezolizumab + Bevacizumab + Carboplatin + Paclitaxel q21d Induction x 4 cycles / Atezolizumab + Bevacizumab q21d Maintenance     02/28/2023 -  Chemotherapy   Patient is on Treatment Plan : LUNG NON-SMALL CELL Bevacizumab + Pemetrexed q21d      HISTORY OF PRESENTING ILLNESS: Ambulating independently.  Accompanied by his wife.   [name] 42 y.o.   male patient with stage IV lung cancer adenocarcinoma-brain mets [synchronous]  bone mets EGFR mutated -currently on Alimta- Bev is here for follow-up.  Patient currently on Alimta+ M-vasi s/p cycle #2  approximately 3 week ago.  Also currently on prednisone for possible hepatitis from immunotherapy.  C/o of eyes itching and burning x2 days. Currently on  Eliquis 5mg  qd instead of bid due to bleeding from his nose.  Unfortunately continues to have  nosebleeds/easy bruising.   Shortness of breath coughing improved. Denies any abdominal pain nausea vomiting. Denies any worsening headaches. No worsening pain.   No worsening skin rash.   Review of Systems  Constitutional:  Positive for malaise/fatigue. Negative for chills, diaphoresis and fever.  HENT:  Negative for nosebleeds and sore throat.   Eyes:  Negative for double vision.  Respiratory:  Positive for cough. Negative for wheezing.   Cardiovascular:  Negative for chest pain, palpitations, orthopnea and leg swelling.  Gastrointestinal:  Positive for heartburn and nausea. Negative for abdominal pain, blood in stool, diarrhea and melena.  Genitourinary:  Negative for dysuria, frequency and urgency.  Musculoskeletal:  Negative for back pain and joint pain.  Skin:  Negative for itching.  Neurological:  Negative for tingling and focal weakness.  Endo/Heme/Allergies:  Does not bruise/bleed easily.  Psychiatric/Behavioral:  Negative for depression. The patient is not nervous/anxious and does not have insomnia.      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    SURGICAL HISTORY: Past Surgical History:  Procedure Laterality Date   FRACTURE SURGERY     broke femur when he was 6 years   PORTA CATH INSERTION N/A 03/28/2021   Procedure: PORTA CATH INSERTION;  Surgeon: Renford Dills, MD;  Location: [facility] INVASIVE CV  LAB;  Service: Cardiovascular;  Laterality: N/A;   VIDEO BRONCHOSCOPY WITH ENDOBRONCHIAL NAVIGATION N/A 03/15/2021   Procedure: VIDEO BRONCHOSCOPY WITH ENDOBRONCHIAL NAVIGATION;  Surgeon: Vida Rigger, MD;  Location: [facility] ORS;  Service: Thoracic;  Laterality: N/A;   VIDEO BRONCHOSCOPY WITH ENDOBRONCHIAL ULTRASOUND N/A 03/15/2021   Procedure: VIDEO BRONCHOSCOPY WITH ENDOBRONCHIAL ULTRASOUND;  Surgeon: Vida Rigger, MD;  Location: [facility] ORS;  Service: Thoracic;  Laterality: N/A;    SOCIAL HISTORY: Social History   Socioeconomic History   Marital status: Married    Spouse name: Darl Pikes   Number of children: 2   Years of education: Not on file   Highest education level: Not on file  Occupational History   Not on file  Tobacco Use   Smoking status: Never   Smokeless tobacco: Former    Types: Snuff, Chew  Vaping Use   Vaping Use: Never used  Substance and Sexual Activity   Alcohol use: Never   Drug use: Never   Sexual activity: Yes  Other Topics Concern   Not on file  Social History Narrative   [place]; with wife; 2 daughters[12 and 22]; never smoked; rare alcohol. 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 Dog and cat.   Social Determinants of Health   Financial Resource Strain: Low Risk  (05/10/2022)   Overall Financial Resource Strain (CARDIA)    Difficulty of Paying Living Expenses: Not hard at all  Food Insecurity: No Food Insecurity (05/10/2022)   Hunger Vital Sign    Worried About Running Out of Food in the Last Year: Never true    Ran Out of Food in the Last Year: Never true  Transportation Needs: No Transportation Needs (03/16/2022)   PRAPARE - Administrator, Civil Service (Medical): No    Lack of Transportation (Non-Medical): No  Physical Activity: Not on file  Stress: Not on file  Social Connections: Socially Integrated (05/10/2022)   Social Connection and Isolation Panel [NHANES]    Frequency of Communication with Friends and Family: More than three times a week     Frequency of Social Gatherings with Friends and Family: More than three times a week    Attends Religious Services: More than 4 times per year    Active Member of [employer] or Organizations: Yes    Attends Engineer, structural: More than 4 times per year    Marital Status: Married  Catering manager Violence: Not on file    FAMILY HISTORY: Family History  Problem Relation Age of Onset   High Cholesterol Mother    Hypertension Father    Lung cancer Father    Skin cancer Father     ALLERGIES:  is allergic to codeine.  MEDICATIONS:  Current Outpatient Medications  Medication Sig Dispense Refill   albuterol (VENTOLIN HFA) 108 (90 Base) MCG/ACT inhaler Inhale 2 puffs into the lungs every 6 (six) hours as needed for wheezing or shortness of breath. 8 g 2   apixaban (ELIQUIS) 2.5 MG TABS tablet Take 1 tablet (2.5 mg total) by mouth 2 (two) times daily. 60 tablet 4   calcium carbonate (TUMS EX) 750 MG chewable tablet Chew 1 tablet by mouth daily.     ergocalciferol (VITAMIN D2) 1.25 MG (50000 UT) capsule Take 1 capsule (50,000 Units total) by mouth once a week. 12 capsule 1   folic acid (FOLVITE) 1 MG tablet TAKE 1 TABLET BY MOUTH EVERY DAY 90 tablet 1   lidocaine-prilocaine (EMLA) cream Apply 1 Application topically as needed. 30 g 0   metoprolol succinate (TOPROL-XL) 50 MG 24 hr tablet Take 50 mg by mouth daily. Take with or immediately following a meal.     predniSONE (DELTASONE) 20 MG tablet Recommend prednisone taper-start taking prednisone 3 pills for 1 week; and then 2 pills for 1 week; and then 1 pill a day-do not stop until further directions.  Take with breakfast daily. 100 tablet 0   sertraline (ZOLOFT) 50 MG tablet Take 1 tablet (50 mg total) by mouth daily. 30 tablet 1   No current facility-administered medications for this visit.   Facility-Administered Medications Ordered in Other Visits  Medication Dose Route Frequency Provider Last Rate Last Admin   heparin lock flush  100 UNIT/ML injection            heparin lock flush 100 UNIT/ML injection            sodium chloride flush (NS) 0.9 % injection 10 mL  10 mL Intravenous PRN [name], MD   10 mL at 07/28/21 0852      .  PHYSICAL EXAMINATION: ECOG PERFORMANCE STATUS: 1 - Symptomatic but completely ambulatory  Vitals:   04/09/23 0846  BP: (!) 137/98  Pulse: 71  Temp: (!) 97.1 F (36.2 C)  SpO2: 97%       Filed Weights   04/09/23 0846  Weight: 233 lb (105.7 kg)      Left cheek papular lesion noted.  No signs of infection.  Physical Exam HENT:     Head: Normocephalic and atraumatic.     Mouth/Throat:     Pharynx: No oropharyngeal exudate.  Eyes:  Pupils: Pupils are equal, round, and reactive to light.  Cardiovascular:     Rate and Rhythm: Normal rate and regular rhythm.  Pulmonary:     Effort: No respiratory distress.     Breath sounds: No wheezing.     Comments: Decreased breath sound on the left side compared to right. Abdominal:     General: Bowel sounds are normal. There is no distension.     Palpations: Abdomen is soft. There is no mass.     Tenderness: There is no abdominal tenderness. There is no guarding or rebound.  Musculoskeletal:        General: No tenderness. Normal range of motion.     Cervical back: Normal range of motion and neck supple.  Skin:    General: Skin is warm.  Neurological:     Mental Status: He is alert and oriented to person, place, and time.  Psychiatric:        Mood and Affect: Affect normal.    Fungal dermatitis bilateral buttocks groin/underneath abdominal pannus  LABORATORY DATA:  I have reviewed the data as listed Lab Results  Component Value Date   WBC 10.6 (H) 04/09/2023   HGB 14.5 04/09/2023   HCT 44.0 04/09/2023   MCV 94.4 04/09/2023   PLT 103 (L) 04/09/2023   Recent Labs    03/06/23 0940 03/20/23 0801 04/09/23 0848  NA 136 139 138  K 3.4* 3.7 3.7  CL 104 105 104  CO2 24 28 26   GLUCOSE 122* 86 131*   BUN 20 17 19   CREATININE 0.68 0.84 0.92  CALCIUM 7.9* 8.6* 8.2*  GFRNONAA >60 >60 >60  PROT 5.9* 5.9* 5.8*  ALBUMIN 3.2* 3.3* 3.3*  AST 116* 78* 85*  ALT 215* 202* 197*  ALKPHOS 122 109 127*  BILITOT 2.0* 0.8 1.0    RADIOGRAPHIC STUDIES: I have personally reviewed the radiological images as listed and agreed with the findings in the report. No results found.  ASSESSMENT & PLAN:   Cancer of lower lobe of left lung (HCC) # STAGE IV- [JUNE 2022] Left lower lobe lung cancer-adenocarcinoma the lung;  EGFR- 19 del POSITIVE. s /p second opinion at [facility]- Dr.Stinchcomb. status post 4 cycles of carbo Taxol-Atezo+Bev- JAN 25th, 2024-PET- Interval response to therapy with resolution of previously demonstrated extensive hypermetabolic activity throughout the liver.  PET scan April 17th- Unchanged appearance of chronic loculated left pleural effusion/fibrothorax with new hypermetabolic activity of the pleura and adjacent consolidations, findings may be due to pseudoprogression or progressive disease. New irregular areas of hypermetabolic activity in the liver associated with previously described metastatic liver lesions, findings may be due to pseudoprogression or progressive disease. April 21st- liver MRI-  showed positive response to therapy when compared to prior abdominal MRI in terms of regression of diffuse hepatic metastatic disease.  Much of these imaging findings may be attributable to developing areas of hepatic fibrosis at the site of treated lesions, however, given the hypermetabolism in these regions on the recent PET-CT, some degree of residual tumor is not excluded. No new lesions are identified.   However clinically concerned about progression esp in liver- Discontinued Cycle #5  -Atezo + bev-given clinical concerns of progression [extreme fatigue rising LFTs].  # currently on Alimta+ M-vasi.   # proceed with  Alimta+ M-vasi s/p cyle #3.   Will plan imaging based after the 3 cycles of chemo;  will order PET sacn today. Labs-CBC/chemistries were reviewed with the patient.   # Elevated LFTs: Normal bilirubin-question related  to immunotherapy versus progressive disease.  Currently on prednisone 20 mg - LFTs slightly imrpoved- Continue taking prednisone  1 pill a day 1 week; half a pill a day for 1 week; and then half a pill every other day-do not stop until further directions.   # Nose bleeds: multifactorial- avastin+ platelets-currently on low-dose of Eliquis given thrombocytopenia from chemotherapy.  Platelets improved however,  continue Eliquis at low-dose of 2.5 mg twice a day; if worse- then recommend 1/2 dose of eliquis.   # Unexplained severe fatigue-unclear etiology s/p prednisone as above improved.  Continue prednisone taper-as above monitor closely.  Discussed current American ginseng.  # Eye burning/tearing- recommend Patanol eye drop BID.   #Asymptomatic multiple brain mets subcentimeter asymptomatic-  -s/p SBRT SEP 2023- [[place]].  S/p SBRT [[place]]- FEB 12th, 2024. MAY 6th, 2024- MRI Brain-Fourteen new subcentimeter brain metastases. No associated edema. Stable to decreased size of numerous other metastases.  Discussed with Dr. Basilio Cairo.  S/p evaluation with neuro-oncology Dr. Verdie Shire whole brain radiation down the line.  Awaiting repeat MRI in July. Stable clinically.   # bone metastases-Hypocalcemia: mild- continue calcium 1200 plus vitamin D-3 1000-over-the-counter-1 pill a day.  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 Decrease the dose of Eliquis 2.5 mg BID.[given nose bleeds]  #Bone metastases- CT DEc 7th, 2022 to healing process.  Dental evaluation on 9/21.  Continue calcium vitamin D intake. Stable  # Cardiac  arrhythmia-SVT s/p adenosine post port [June 2022]; Hx of WPW-stable on metoprolol-Stable  # IV access/ port flush.   [Tuesday/wed]- pref B12 #1 on 6/25  # DISPOSITION:  # chemo today- Alimta+ M-vasi; B12 injection # as per IS- Follow up in 3  weeks- MD; port-labs- cbc/cmp; CEA; UA; Alimta+ M-vasi; PET scan-- Dr.B  All questions were answered. The patient knows to call the clinic with any problems, questions or concerns.    [name], MD 04/09/2023 10:20 AM

## 2023-04-10 LAB — CEA: CEA: 18.8 ng/mL — ABNORMAL HIGH (ref 0.0–4.7)

## 2023-04-13 ENCOUNTER — Other Ambulatory Visit: Payer: Self-pay | Admitting: Nurse Practitioner

## 2023-04-13 DIAGNOSIS — C3432 Malignant neoplasm of lower lobe, left bronchus or lung: Secondary | ICD-10-CM

## 2023-04-17 ENCOUNTER — Encounter: Payer: Self-pay | Admitting: Nurse Practitioner

## 2023-04-17 ENCOUNTER — Encounter: Payer: Self-pay | Admitting: Internal Medicine

## 2023-04-22 ENCOUNTER — Ambulatory Visit
Admission: RE | Admit: 2023-04-22 | Discharge: 2023-04-22 | Disposition: A | Discharge status: Home / Self Care | Payer: Managed Care, Other (non HMO) | Source: Ambulatory Visit | Attending: Internal Medicine | Admitting: Internal Medicine

## 2023-04-22 DIAGNOSIS — C3432 Malignant neoplasm of lower lobe, left bronchus or lung: Secondary | ICD-10-CM | POA: Insufficient documentation

## 2023-04-22 DIAGNOSIS — K769 Liver disease, unspecified: Secondary | ICD-10-CM | POA: Diagnosis not present

## 2023-04-22 DIAGNOSIS — J90 Pleural effusion, not elsewhere classified: Secondary | ICD-10-CM | POA: Diagnosis not present

## 2023-04-22 LAB — GLUCOSE, CAPILLARY: Glucose-Capillary: 100 mg/dL — ABNORMAL HIGH (ref 70–99)

## 2023-04-22 MED ORDER — FLUDEOXYGLUCOSE F - 18 (FDG) INJECTION
12.0000 | Freq: Once | INTRAVENOUS | Status: AC | PRN
  Administered 2023-04-22: 13 via INTRAVENOUS

## 2023-04-23 ENCOUNTER — Ambulatory Visit
Admission: RE | Admit: 2023-04-23 | Discharge: 2023-04-23 | Disposition: A | Discharge status: Home / Self Care | Payer: Managed Care, Other (non HMO) | Source: Ambulatory Visit | Attending: Radiation Oncology | Admitting: Radiation Oncology

## 2023-04-23 DIAGNOSIS — C7931 Secondary malignant neoplasm of brain: Secondary | ICD-10-CM

## 2023-04-23 MED ORDER — GADOPICLENOL 0.5 MMOL/ML IV SOLN
10.0000 mL | Freq: Once | INTRAVENOUS | Status: AC | PRN
  Administered 2023-04-23: 10 mL via INTRAVENOUS

## 2023-04-25 ENCOUNTER — Ambulatory Visit: Payer: Managed Care, Other (non HMO) | Admitting: Internal Medicine

## 2023-04-26 ENCOUNTER — Inpatient Hospital Stay: Payer: Managed Care, Other (non HMO) | Attending: Internal Medicine | Admitting: Internal Medicine

## 2023-04-26 VITALS — BP 125/93 | HR 81 | Resp 18 | Ht 71.0 in | Wt 233.0 lb

## 2023-04-26 DIAGNOSIS — R7989 Other specified abnormal findings of blood chemistry: Secondary | ICD-10-CM | POA: Insufficient documentation

## 2023-04-26 DIAGNOSIS — C7951 Secondary malignant neoplasm of bone: Secondary | ICD-10-CM | POA: Insufficient documentation

## 2023-04-26 DIAGNOSIS — D6959 Other secondary thrombocytopenia: Secondary | ICD-10-CM | POA: Diagnosis not present

## 2023-04-26 DIAGNOSIS — C7931 Secondary malignant neoplasm of brain: Secondary | ICD-10-CM | POA: Diagnosis not present

## 2023-04-26 DIAGNOSIS — C3432 Malignant neoplasm of lower lobe, left bronchus or lung: Secondary | ICD-10-CM | POA: Diagnosis present

## 2023-04-26 DIAGNOSIS — Z5111 Encounter for antineoplastic chemotherapy: Secondary | ICD-10-CM | POA: Insufficient documentation

## 2023-04-26 DIAGNOSIS — Z5112 Encounter for antineoplastic immunotherapy: Secondary | ICD-10-CM | POA: Insufficient documentation

## 2023-04-26 DIAGNOSIS — C787 Secondary malignant neoplasm of liver and intrahepatic bile duct: Secondary | ICD-10-CM | POA: Insufficient documentation

## 2023-04-26 NOTE — Progress Notes (Signed)
[facility] at [facility] [address]  [place], [place] [address] [phone]   Interval Evaluation  Date of Service: 04/26/23 Patient Name: DANIELL TINDER Patient MRN: [id number] Patient DOB: 06/07/1981 Provider: Henreitta Leber, MD  Identifying Statement:  KAINOAH DILES is a 42 y.o. male with Malignant neoplasm metastatic to brain [facility])   Primary Cancer:  Oncologic History: Oncology History Overview Note  IMPRESSION: 1. Patchy nodular fat stranding throughout the anterior left upper quadrant peritoneal fat, nonspecific, cannot exclude peritoneal carcinomatosis. Dedicated CT abdomen/pelvis with oral and IV contrast recommended for further evaluation. 2. Dense patchy consolidation replacing much of the left lower lung lobe, appearing masslike in the superior segment left lower lobe, with associated bulging of the left major fissure and associated left lower lobe volume loss. Fine nodularity throughout both lungs with an upper lobe predominance. Asymmetric left upper lobe interlobular septal thickening. These findings are indeterminate, with differential including multilobar pneumonia, sarcoidosis or a neoplastic process. The persistence on radiographs back to 01/20/2021 despite antibiotic therapy make sarcoidosis or a neoplastic process more likely. Pulmonology consultation suggested for consideration of bronchoscopic evaluation. 3. Small dependent left pleural effusion. 4. Mild mediastinal lymphadenopathy, nonspecific. 5. Subacute healing lateral right sixth rib fracture.   DIAGNOSIS:  A. LUNG, LEFT LOWER LOBE; ENB-ASSISTED BIOPSY:  - NON-SMALL CELL CARCINOMA, FAVOR ADENOCARCINOMA.  - FOREIGN MATERIAL SUGGESTIVE OF ASPIRATION.   # EGFR MUTATED: 53 del- June 28th, 2022- [name]. [limited]  # Asymptomatic multiple brain mets subcentimeter asymptomatic -JUNE 29th, 2023- Numerous metastatic lesions in the supratentorial brain-increase in  number also conspicuity compared to January 2023.  Pre-SBRT MRI brain AUG 18th, 2023-  Eighteen small enhancing brain metastases (punctate to 11 mm); were all present on 03/28/2021-s/p SBRT SEP 2023- [[place]]; SRS/ radiation treatment for brain metastasis He finished treatment on 11-26-22.   # SEP-OCT-worsening left-sided pleural effusion-status post paracentesis; exudative; Cytology negative- ?  Progressive disease.  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  While on osimertinib- NOV 1st, 2023- PET scan- shows progressive disease in liver. Liquid Biopsy/Guardant testing-positive for EGFR mutation; otherwise Negative for any novel mutations.  Repeat liver Biopsy positive for adenocarcinoma. NEGATIVE for MET amplification.  S/p second opinion at [facility]- Dr.Stinchcomb. status post 4 cycles of carbo Taxol-Atezo+Bev- JAN 25th, 2024-PET- Interval response to therapy with resolution of previously demonstrated extensive hypermetabolic activity throughout the liver. PARIL 2024- ? Partial response, but clinically worse/rising LFTs  # MAY 6th, Brain MRI-progressive multiple brain metastases  # MAY 16th, 2024- Alimta+Avastin #1  # MAY 16th, 2024-    Cancer of lower lobe of left lung (HCC)  03/23/2021 Initial Diagnosis   Cancer of lower lobe of left lung (HCC)   03/29/2021 Cancer Staging   Staging form: Lung, AJCC 8th Edition - Clinical: Stage IVB (cT3, cN3, pM1c) - Signed by [name], MD on 03/29/2021   03/30/2021 - 03/30/2021 Chemotherapy   Patient is on Treatment Plan : LUNG NSCLC Pemetrexed (Alimta) / Carboplatin q21d x 1 cycles     08/17/2022 - 01/15/2023 Chemotherapy   Patient is on Treatment Plan : LUNG Atezolizumab + Bevacizumab + Carboplatin + Paclitaxel q21d Induction x 4 cycles / Atezolizumab + Bevacizumab q21d Maintenance     02/28/2023 -  Chemotherapy   Patient is on Treatment Plan : LUNG  NON-SMALL CELL Bevacizumab + Pemetrexed q21d      CNS Oncologic  History 06/05/22: SRS x18 Basilio [place]) 11/26/22: SRS x13 Basilio [place])  Interval History: NORRIN ALCARAZ presents today for follow up after recent MRI brain.  Has had a few headaches, typically following decrease in prednisone dose.  He otherwise describes no new or progressive neurologic changes.  No further episodes of severe fatigue described previously.  He is currently on Alimta and Avastin for NSCLC.    Prior- He describes bout of severe fatigue, coming on suddenly and lasting "about 2 weeks" recently having resolved.  He has been dosing prednisone 100mg  daily without ill effect.  Last immunotherapy infusion was in early April, most recent rx having been held due to the fatigue symptoms.    H+P (05/03/22) Patient presents today to review recent finding of progressive brain metastases from lung cancer.  Mets were originally identified in June 2022, he was asymptomatic at that time.  Decision was made to monitor with serial imaging given EGFR status and utilization of tagrisso through Dr. Donneta Romberg.  Recent MRI demonstrates progression of disease in the brain, but he remains asymptomatic.    Medications: Current Outpatient Medications on File Prior to Visit  Medication Sig Dispense Refill   albuterol (VENTOLIN HFA) 108 (90 Base) MCG/ACT inhaler Inhale 2 puffs into the lungs every 6 (six) hours as needed for wheezing or shortness of breath. 8 g 2   apixaban (ELIQUIS) 2.5 MG TABS tablet Take 1 tablet (2.5 mg total) by mouth 2 (two) times daily. 60 tablet 4   calcium carbonate (TUMS EX) 750 MG chewable tablet Chew 1 tablet by mouth daily.     ergocalciferol (VITAMIN D2) 1.25 MG (50000 UT) capsule Take 1 capsule (50,000 Units total) by mouth once a week. 12 capsule 1   folic acid (FOLVITE) 1 MG tablet TAKE 1 TABLET BY MOUTH EVERY DAY 90 tablet 1   lidocaine-prilocaine (EMLA) cream Apply 1 Application topically as needed. 30 g 0    metoprolol succinate (TOPROL-XL) 50 MG 24 hr tablet Take 50 mg by mouth daily. Take with or immediately following a meal.     predniSONE (DELTASONE) 20 MG tablet Recommend prednisone taper-start taking prednisone 3 pills for 1 week; and then 2 pills for 1 week; and then 1 pill a day-do not stop until further directions.  Take with breakfast daily. 100 tablet 0   sertraline (ZOLOFT) 50 MG tablet Take 1 tablet (50 mg total) by mouth daily. 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   Past Surgical History:  Past Surgical History:  Procedure Laterality Date   FRACTURE SURGERY     broke femur when he was 6 years   PORTA CATH INSERTION N/A 03/28/2021   Procedure: PORTA CATH INSERTION;  Surgeon: Levora Dredge  G, MD;  Location: [facility] INVASIVE CV LAB;  Service: Cardiovascular;  Laterality: N/A;   VIDEO BRONCHOSCOPY WITH ENDOBRONCHIAL NAVIGATION N/A 03/15/2021   Procedure: VIDEO BRONCHOSCOPY WITH ENDOBRONCHIAL NAVIGATION;  Surgeon: Vida Rigger, MD;  Location: [facility] ORS;  Service: Thoracic;  Laterality: N/A;   VIDEO BRONCHOSCOPY WITH ENDOBRONCHIAL ULTRASOUND N/A 03/15/2021   Procedure: VIDEO BRONCHOSCOPY WITH ENDOBRONCHIAL ULTRASOUND;  Surgeon: Vida Rigger, MD;  Location: [facility] ORS;  Service: Thoracic;  Laterality: N/A;   Social History:  Social History   Socioeconomic History   Marital status: Married    Spouse  name: Darl Pikes   Number of children: 2   Years of education: Not on file   Highest education level: Not on file  Occupational History   Not on file  Tobacco Use   Smoking status: Never   Smokeless tobacco: Former    Types: Snuff, Chew  Vaping Use   Vaping status: Never Used  Substance and Sexual Activity   Alcohol use: Never   Drug use: Never   Sexual activity: Yes  Other Topics Concern   Not on file  Social History Narrative   [place]; with wife; 2 daughters[12 and 22]; never smoked; rare alcohol. 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 Dog and cat.   Social Determinants of Health   Financial Resource Strain: Low Risk  (05/10/2022)   Overall Financial Resource Strain (CARDIA)    Difficulty of Paying Living Expenses: Not hard at all  Food Insecurity: No Food Insecurity (05/10/2022)   Hunger Vital Sign    Worried About Running Out of Food in the Last Year: Never true    Ran Out of Food in the Last Year: Never true  Transportation Needs: No Transportation Needs (03/16/2022)   PRAPARE - Administrator, Civil Service (Medical): No    Lack of Transportation (Non-Medical): No  Physical Activity: Not on file  Stress: Not on file  Social Connections: Socially Integrated (05/10/2022)   Social Connection and Isolation Panel [NHANES]    Frequency of Communication with Friends and Family: More than three times a week    Frequency of Social Gatherings with Friends and Family: More than three times a week    Attends Religious Services: More than 4 times per year    Active Member of [employer] or Organizations: Yes    Attends Engineer, structural: More than 4 times per year    Marital Status: Married  Catering manager Violence: Not on file   Family History:  Family History  Problem Relation Age of Onset   High Cholesterol Mother    Hypertension Father    Lung cancer Father    Skin cancer Father     Review of Systems: Constitutional: Doesn't report fevers, chills or abnormal weight  loss Eyes: Doesn't report blurriness of vision Ears, nose, mouth, throat, and face: Doesn't report sore throat Respiratory: Doesn't report cough, dyspnea or wheezes Cardiovascular: Doesn't report palpitation, chest discomfort  Gastrointestinal:  Doesn't report nausea, constipation, diarrhea GU: Doesn't report incontinence Skin: Doesn't report skin rashes Neurological: Per HPI Musculoskeletal: Doesn't report joint pain Behavioral/Psych: Doesn't report anxiety  Physical Exam: There were no vitals filed for this visit.  KPS: 90. General: Alert, cooperative, pleasant, in no acute distress Head: Normal EENT: No conjunctival injection or scleral icterus.  Lungs: Resp effort normal Cardiac: Regular rate Abdomen: Non-distended abdomen Skin: No rashes cyanosis or petechiae. Extremities: No clubbing or edema  Neurologic Exam: Mental Status: Awake, alert, attentive to examiner. Oriented to self and  environment. Language is fluent with intact comprehension.  Cranial Nerves: Visual acuity is grossly normal. Visual fields are full. Extra-ocular movements intact. No ptosis. Face is symmetric Motor: Tone and bulk are normal. Power is full in both arms and legs. Reflexes are symmetric, no pathologic reflexes present.  Sensory: Intact to light touch Gait: Normal.   Labs: I have reviewed the data as listed    Component Value Date/Time   NA 138 04/09/2023 0848   K 3.7 04/09/2023 0848   CL 104 04/09/2023 0848   CO2 26 04/09/2023 0848   GLUCOSE 131 (H) 04/09/2023 0848   BUN 19 04/09/2023 0848   CREATININE 0.92 04/09/2023 0848   CALCIUM 8.2 (L) 04/09/2023 0848   PROT 5.8 (L) 04/09/2023 0848   ALBUMIN 3.3 (L) 04/09/2023 0848   AST 85 (H) 04/09/2023 0848   ALT 197 (H) 04/09/2023 0848   ALKPHOS 127 (H) 04/09/2023 0848   BILITOT 1.0 04/09/2023 0848   GFRNONAA >60 04/09/2023 0848   Lab Results  Component Value Date   WBC 10.6 (H) 04/09/2023   NEUTROABS 7.1 04/09/2023   HGB 14.5  04/09/2023   HCT 44.0 04/09/2023   MCV 94.4 04/09/2023   PLT 103 (L) 04/09/2023    Imaging:  MR Brain W Wo Contrast  Result Date: 04/25/2023 CLINICAL DATA:  Brain metastases, assess treatment response 3T SRS Protocol EXAM: MRI HEAD WITHOUT AND WITH CONTRAST TECHNIQUE: Multiplanar, multiecho pulse sequences of the brain and surrounding structures were obtained without and with intravenous contrast. CONTRAST:  10 mL Vueway COMPARISON:  MRI head Feb 18, 2023. FINDINGS: Brain: Decreased conspicuity of all lesions with some lesions described on the prior no longer visible and others less visible. No new lesions identified. Similar mild edema surrounding a high left frontal lesion. No significant mass effect. No evidence of acute infarct, acute hemorrhage, mass effect or midline shift. No hydrocephalus. Vascular: Major arterial flow voids are maintained at the skull base. Skull and upper cervical spine: Normal marrow signal. Sinuses/Orbits: Mostly clear sinuses.  No acute orbital findings. Other: No mastoid effusions IMPRESSION: Decreased conspicuity of all enhancing lesions with some lesions described on the prior no longer visible and others less visible. No new lesions identified. Electronically Signed   By: Feliberto Harts M.D.   On: 04/25/2023 15:32    [facility] Clinician Interpretation: I have personally reviewed the radiological images as listed.  My interpretation, in the context of the patient's clinical presentation, is progressive disease   Assessment/Plan No diagnosis found.  MARNE AUSLEY is clinically stable today.  Previous MRI brain demonstrated multiple punctate new lesions; these appeared to have resolved on the present scan.  Etiology or mechanism for this improvement in unclear, possibly avastin effect.  His case will be reviewed again in CNS tumor board this week.  Recommendation was made to continue close surveillance.    We appreciate the opportunity to participate in the care  of AISEN CAPATI.    We ask that ABDULWAHAB FRADE return to clinic in 3 months following next brain MRI, or sooner as needed.  All questions were answered. The patient knows to call the clinic with any problems, questions or concerns. No barriers to learning were detected.  The total time spent in the encounter was 30 minutes and more than 50% was on counseling and review of test results   Henreitta Leber, MD Medical Director of Neuro-Oncology [facility] at [place] 04/26/23 9:13 AM

## 2023-04-26 NOTE — Progress Notes (Signed)
Patient reports headaches which is wondering if its related to tapering the prednisone possibly. His eiquis is 2.5mg  ONCE per day now due to nose bleeds.

## 2023-04-29 ENCOUNTER — Inpatient Hospital Stay: Payer: 59 | Attending: Radiation Oncology

## 2023-04-30 ENCOUNTER — Other Ambulatory Visit: Payer: Self-pay | Admitting: *Deleted

## 2023-04-30 ENCOUNTER — Encounter: Payer: Self-pay | Admitting: Internal Medicine

## 2023-04-30 ENCOUNTER — Inpatient Hospital Stay: Payer: Managed Care, Other (non HMO)

## 2023-04-30 ENCOUNTER — Inpatient Hospital Stay (HOSPITAL_BASED_OUTPATIENT_CLINIC_OR_DEPARTMENT_OTHER): Payer: Managed Care, Other (non HMO) | Admitting: Internal Medicine

## 2023-04-30 VITALS — BP 126/91 | HR 75 | Temp 95.1°F | Resp 18 | Ht 71.0 in | Wt 233.4 lb

## 2023-04-30 DIAGNOSIS — C3432 Malignant neoplasm of lower lobe, left bronchus or lung: Secondary | ICD-10-CM

## 2023-04-30 DIAGNOSIS — Z5112 Encounter for antineoplastic immunotherapy: Secondary | ICD-10-CM | POA: Diagnosis not present

## 2023-04-30 LAB — CBC WITH DIFFERENTIAL (CANCER CENTER ONLY)
Abs Immature Granulocytes: 0.59 10*3/uL — ABNORMAL HIGH (ref 0.00–0.07)
Basophils Absolute: 0.1 10*3/uL (ref 0.0–0.1)
Basophils Relative: 2 %
Eosinophils Absolute: 0.1 10*3/uL (ref 0.0–0.5)
Eosinophils Relative: 1 %
HCT: 40.7 % (ref 39.0–52.0)
Hemoglobin: 13.7 g/dL (ref 13.0–17.0)
Immature Granulocytes: 8 %
Lymphocytes Relative: 18 %
Lymphs Abs: 1.5 10*3/uL (ref 0.7–4.0)
MCH: 32.3 pg (ref 26.0–34.0)
MCHC: 33.7 g/dL (ref 30.0–36.0)
MCV: 96 fL (ref 80.0–100.0)
Monocytes Absolute: 1.3 10*3/uL — ABNORMAL HIGH (ref 0.1–1.0)
Monocytes Relative: 16 %
Neutro Abs: 4.4 10*3/uL (ref 1.7–7.7)
Neutrophils Relative %: 55 %
Platelet Count: 159 10*3/uL (ref 150–400)
RBC: 4.24 MIL/uL (ref 4.22–5.81)
RDW: 19.7 % — ABNORMAL HIGH (ref 11.5–15.5)
Smear Review: NORMAL
WBC Count: 7.9 10*3/uL (ref 4.0–10.5)
nRBC: 0 % (ref 0.0–0.2)

## 2023-04-30 LAB — CMP (CANCER CENTER ONLY)
ALT: 96 U/L — ABNORMAL HIGH (ref 0–44)
AST: 70 U/L — ABNORMAL HIGH (ref 15–41)
Albumin: 3.4 g/dL — ABNORMAL LOW (ref 3.5–5.0)
Alkaline Phosphatase: 142 U/L — ABNORMAL HIGH (ref 38–126)
Anion gap: 5 (ref 5–15)
BUN: 10 mg/dL (ref 6–20)
CO2: 26 mmol/L (ref 22–32)
Calcium: 8.5 mg/dL — ABNORMAL LOW (ref 8.9–10.3)
Chloride: 106 mmol/L (ref 98–111)
Creatinine: 0.97 mg/dL (ref 0.61–1.24)
GFR, Estimated: >60 mL/min (ref >60)
Glucose, Bld: 85 mg/dL (ref 70–99)
Potassium: 4.2 mmol/L (ref 3.5–5.1)
Sodium: 137 mmol/L (ref 135–145)
Total Bilirubin: 0.9 mg/dL (ref 0.3–1.2)
Total Protein: 6.3 g/dL — ABNORMAL LOW (ref 6.5–8.1)

## 2023-04-30 LAB — URINALYSIS, COMPLETE (UACMP) WITH MICROSCOPIC
Bilirubin Urine: NEGATIVE
Glucose, UA: NEGATIVE mg/dL
Hgb urine dipstick: NEGATIVE
Ketones, ur: NEGATIVE mg/dL
Leukocytes,Ua: NEGATIVE
Nitrite: NEGATIVE
Protein, ur: NEGATIVE mg/dL
Specific Gravity, Urine: 1.021 (ref 1.005–1.030)
pH: 5 (ref 5.0–8.0)

## 2023-04-30 MED ORDER — SODIUM CHLORIDE 0.9 % IV SOLN
15.0000 mg/kg | Freq: Once | INTRAVENOUS | Status: AC
  Administered 2023-04-30: 1600 mg via INTRAVENOUS
  Filled 2023-04-30: qty 64

## 2023-04-30 MED ORDER — SODIUM CHLORIDE 0.9 % IV SOLN
Freq: Once | INTRAVENOUS | Status: AC
  Filled 2023-04-30: qty 250

## 2023-04-30 MED ORDER — SODIUM CHLORIDE 0.9 % IV SOLN
500.0000 mg/m2 | Freq: Once | INTRAVENOUS | Status: AC
  Administered 2023-04-30: 1100 mg via INTRAVENOUS
  Filled 2023-04-30: qty 40

## 2023-04-30 MED ORDER — HEPARIN SOD (PORK) LOCK FLUSH 100 UNIT/ML IV SOLN
500.0000 [IU] | Freq: Once | INTRAVENOUS | Status: AC | PRN
  Filled 2023-04-30: qty 5

## 2023-04-30 MED ORDER — HEPARIN SOD (PORK) LOCK FLUSH 100 UNIT/ML IV SOLN
INTRAVENOUS | Status: AC
  Administered 2023-04-30: 500 [IU]
  Filled 2023-04-30: qty 5

## 2023-04-30 MED ORDER — PROCHLORPERAZINE MALEATE 10 MG PO TABS
10.0000 mg | ORAL_TABLET | Freq: Once | ORAL | Status: AC
  Administered 2023-04-30: 10 mg via ORAL
  Filled 2023-04-30: qty 1

## 2023-04-30 NOTE — Patient Instructions (Signed)
[place] [facility]  Discharge Instructions: Thank you for choosing [facility] to provide your oncology and hematology care.  If you have a lab appointment with the Cancer Center, please go directly to the Cancer Center and check in at the registration area.  Wear comfortable clothing and clothing appropriate for easy access to any Portacath or PICC line.   We strive to give you quality time with your provider. You may need to reschedule your appointment if you arrive late (15 or more minutes).  Arriving late affects you and other patients whose appointments are after yours.  Also, if you miss three or more appointments without notifying the office, you may be dismissed from the clinic at the provider's discretion.      For prescription refill requests, have your pharmacy contact our office and allow 72 hours for refills to be completed.    Today you received the following chemotherapy and/or immunotherapy agents Avastin & Alimta   To help prevent nausea and vomiting after your treatment, we encourage you to take your nausea medication as directed.  BELOW ARE SYMPTOMS THAT SHOULD BE REPORTED IMMEDIATELY: *FEVER GREATER THAN 100.4 F (38 C) OR HIGHER *CHILLS OR SWEATING *NAUSEA AND VOMITING THAT IS NOT CONTROLLED WITH YOUR NAUSEA MEDICATION *UNUSUAL SHORTNESS OF BREATH *UNUSUAL BRUISING OR BLEEDING *URINARY PROBLEMS (pain or burning when urinating, or frequent urination) *BOWEL PROBLEMS (unusual diarrhea, constipation, pain near the anus) TENDERNESS IN MOUTH AND THROAT WITH OR WITHOUT PRESENCE OF ULCERS (sore throat, sores in mouth, or a toothache) UNUSUAL RASH, SWELLING OR PAIN  UNUSUAL VAGINAL DISCHARGE OR ITCHING   Items with * indicate a potential emergency and should be followed up as soon as possible or go to the Emergency Department if any problems should occur.  Please show the CHEMOTHERAPY ALERT CARD or IMMUNOTHERAPY ALERT CARD at check-in  to the Emergency Department and triage nurse.  Should you have questions after your visit or need to cancel or reschedule your appointment, please contact [place] [facility]  [phone] and follow the prompts.  Office hours are 8:00 a.m. to 4:30 p.m. Monday - Friday. Please note that voicemails left after 4:00 p.m. may not be returned until the following business day.  We are closed weekends and major holidays. You have access to a nurse at all times for urgent questions. Please call the main number to the clinic [phone] and follow the prompts.  For any non-urgent questions, you may also contact your provider using MyChart. We now offer e-Visits for anyone 21 and older to request care online for non-urgent symptoms. For details visit [email or web address].   Also download the MyChart app! Go to the app store, search "MyChart", open the app, select [place], and log in with your MyChart username and password.

## 2023-04-30 NOTE — Progress Notes (Signed)
 No concerns for the provider.

## 2023-04-30 NOTE — Assessment & Plan Note (Addendum)
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 s /p second opinion at [facility]- Dr.Stinchcomb. status post 4 cycles of carbo Taxol-Atezo+Bev- JAN 25th, 2024-PET- Interval response to therapy with resolution of previously demonstrated extensive hypermetabolic activity throughout the liver.  PET scan April 17th- Unchanged appearance of chronic loculated left pleural effusion/fibrothorax with new hypermetabolic activity of the pleura and adjacent consolidations, findings may be due to pseudoprogression or progressive disease. New irregular areas of hypermetabolic activity in the liver associated with previously described metastatic liver lesions, findings may be due to pseudoprogression or progressive disease. April 21st- liver MRI-  showed positive response to therapy when compared to prior abdominal MRI in terms of regression of diffuse hepatic metastatic disease.  Much of these imaging findings may be attributable to developing areas of hepatic fibrosis at the site of treated lesions, however, given the hypermetabolism in these regions on the recent PET-CT, some degree of residual tumor is not excluded. No new lesions are identified.   However clinically concerned about progression esp in liver- Discontinued Cycle #5  -Atezo + bev-given clinical concerns of progression [extreme fatigue rising LFTs].  # currently on Alimta+ M-vasi. JULY  13th, 2024- Subpleural left upper lobe opacities, raising concern for recurrent/metastatic disease. Associated chronic left pleural effusion. Heterogeneous hypermetabolism in the peripheral liver, similar to the prior, suspicious for some degree of residual hepatic metastases.   # proceed with  Alimta+ M-vasi s/p cyle #4. Labs-CBC/chemistries were reviewed with the patient.   # Elevated LFTs: Normal bilirubin-question related to immunotherapy versus progressive disease.  LFTs slightly imrpoved- s/p prednisone  currently on half a  pill every other day-; recommend STOPPING prednisone. Monitor closely.   # Nose bleeds: multifactorial- avastin+ platelets-currently on low-dose of Eliquis given thrombocytopenia from chemotherapy.  Platelets improved however,  continue Eliquis at low-dose of 2.5 mg BID [currently once a day]- stable.   # Unexplained severe fatigue-unclear etiology s/p prednisone as above improved.  Continue prednisone taper-as above monitor closely- American ginseng. Stable.   # Eye burning/tearing- on Patanol eye drop BID- stable.   #Asymptomatic multiple brain mets subcentimeter asymptomatic-  -s/p SBRT SEP 2023- [[place]].  S/p SBRT [[place]]- FEB 12th, 2024. July 9th, 2024-  Stable clinically. Decreased conspicuity of all enhancing lesions with some lesions described on the prior no longer visible and others less visible. No new lesions identified  S/p evaluation with neuro-oncology- stable.   # bone metastases-Hypocalcemia: mild- continue calcium 1200 plus vitamin D-3 1000-over-the-counter-1 pill a day.  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 Continue dose of Eliquis 2.5 mg BID.[given nose bleeds]- see above.   #Bone metastases- CT DEc 7th, 2022 to healing process.  Dental evaluation on 9/21.  Continue calcium vitamin D intake. 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   [Tuesday/wed]- pref B12 #1 on 6/25  # DISPOSITION:  # chemo today- Alimta+ M-vasi;  # as per IS- Follow up in 3  weeks- MD; port-labs- cbc/cmp; CEA; UA; Alimta+ M-vasi;-Dr.B  # I reviewed the blood work- with the patient in detail; also reviewed the imaging independently [as summarized above]; and with  the patient in detail.   #  40 minutes face-to-face with the patient discussing the above plan of care; more than 50% of time spent on prognosis/ natural history; counseling and coordination.

## 2023-04-30 NOTE — Progress Notes (Signed)
[facility] CONSULT NOTE  Patient Care Team: Pcp, No as PCP - General [name], RN as Oncology Nurse Navigator [name], MD as Consulting Physician (Internal Medicine)  CHIEF COMPLAINTS/PURPOSE OF CONSULTATION: Lung cancer  #  Oncology History Overview Note  IMPRESSION: 1. Patchy nodular fat stranding throughout the anterior left upper quadrant peritoneal fat, nonspecific, cannot exclude peritoneal carcinomatosis. Dedicated CT abdomen/pelvis with oral and IV contrast recommended for further evaluation. 2. Dense patchy consolidation replacing much of the left lower lung lobe, appearing masslike in the superior segment left lower lobe, with associated bulging of the left major fissure and associated left lower lobe volume loss. Fine nodularity throughout both lungs with an upper lobe predominance. Asymmetric left upper lobe interlobular septal thickening. These findings are indeterminate, with differential including multilobar pneumonia, sarcoidosis or a neoplastic process. The persistence on radiographs back to 01/20/2021 despite antibiotic therapy make sarcoidosis or a neoplastic process more likely. Pulmonology consultation suggested for consideration of bronchoscopic evaluation. 3. Small dependent left pleural effusion. 4. Mild mediastinal lymphadenopathy, nonspecific. 5. Subacute healing lateral right sixth rib fracture.   DIAGNOSIS:  A. LUNG, LEFT LOWER LOBE; ENB-ASSISTED BIOPSY:  - NON-SMALL CELL CARCINOMA, FAVOR ADENOCARCINOMA.  - FOREIGN MATERIAL SUGGESTIVE OF ASPIRATION.   # EGFR MUTATED: 22 del- June 28th, 2022- [name]. [limited]  # Asymptomatic multiple brain mets subcentimeter asymptomatic -JUNE 29th, 2023- Numerous metastatic lesions in the supratentorial brain-increase in number also conspicuity compared to January 2023.  Pre-SBRT MRI brain AUG 18th, 2023-  Eighteen small enhancing brain metastases (punctate to 11 mm); were all  present on 03/28/2021-s/p SBRT SEP 2023- [[place]]; SRS/ radiation treatment for brain metastasis He finished treatment on 11-26-22.   # SEP-OCT-worsening left-sided pleural effusion-status post paracentesis; exudative; Cytology negative- ?  Progressive disease.  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  While on osimertinib- NOV 1st, 2023- PET scan- shows progressive disease in liver. Liquid Biopsy/Guardant testing-positive for EGFR mutation; otherwise Negative for any novel mutations.  Repeat liver Biopsy positive for adenocarcinoma. NEGATIVE for MET amplification.  S/p second opinion at [facility]- Dr.Stinchcomb. status post 4 cycles of carbo Taxol-Atezo+Bev- JAN 25th, 2024-PET- Interval response to therapy with resolution of previously demonstrated extensive hypermetabolic activity throughout the liver. PARIL 2024- ? Partial response, but clinically worse/rising LFTs  # MAY 6th, Brain MRI-progressive multiple brain metastases  # MAY 16th, 2024- Alimta+Avastin #1  # MAY 16th, 2024-    Cancer of lower lobe of left lung (HCC)  03/23/2021 Initial Diagnosis   Cancer of lower lobe of left lung (HCC)   03/29/2021 Cancer Staging   Staging form: Lung, AJCC 8th Edition - Clinical: Stage IVB (cT3, cN3, pM1c) - Signed by [name], MD on 03/29/2021   03/30/2021 - 03/30/2021 Chemotherapy   Patient is on Treatment Plan : LUNG NSCLC Pemetrexed (Alimta) / Carboplatin q21d x 1 cycles     08/17/2022 - 01/15/2023 Chemotherapy   Patient is on Treatment Plan : LUNG Atezolizumab + Bevacizumab + Carboplatin + Paclitaxel q21d Induction x 4 cycles / Atezolizumab + Bevacizumab q21d Maintenance     02/28/2023 -  Chemotherapy   Patient is on Treatment Plan : LUNG NON-SMALL CELL Bevacizumab + Pemetrexed q21d      HISTORY OF PRESENTING ILLNESS: Ambulating independently.  Accompanied by his wife.   [name] 42 y.o.   male patient with stage IV lung cancer adenocarcinoma-brain mets [synchronous]  bone mets EGFR mutated -currently on Alimta- Bev is here for follow-up/ and review the results of the PET scan/MRI brain.   Patient seen by neuro-oncology in the interim.   Patient currently on Alimta+ M-vasi s/p cycle #3   approximately 3 week ago.  Also currently on prednisone for possible hepatitis from immunotherapy. Complains of ongoing headaches- on tylenol prn.  C/o of eyes itching and burning- improved on patanol. Currently on  Eliquis 2.5mg  qd instead of bid due to bleeding from his nose.    Shortness of breath coughing improved. Denies any abdominal pain nausea vomiting.  No worsening pain.   No worsening skin rash.   Review of Systems  Constitutional:  Positive for malaise/fatigue. Negative for chills, diaphoresis and fever.  HENT:  Negative for nosebleeds and sore throat.   Eyes:  Negative for double vision.  Respiratory:  Positive for cough. Negative for wheezing.   Cardiovascular:  Negative for chest pain, palpitations, orthopnea and leg swelling.  Gastrointestinal:  Positive for heartburn and nausea. Negative for abdominal pain, blood in stool, diarrhea and melena.  Genitourinary:  Negative for dysuria, frequency and urgency.  Musculoskeletal:  Negative for back pain and joint pain.  Skin:  Negative for itching.  Neurological:  Negative for tingling and focal weakness.  Endo/Heme/Allergies:  Does not bruise/bleed easily.  Psychiatric/Behavioral:  Negative for depression. The patient is not nervous/anxious and does not have insomnia.      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    SURGICAL HISTORY: Past Surgical History:  Procedure Laterality Date   FRACTURE SURGERY     broke femur when he was 6 years   PORTA CATH INSERTION N/A 03/28/2021   Procedure: PORTA  CATH INSERTION;  Surgeon: Renford Dills, MD;  Location: [facility] INVASIVE CV LAB;  Service: Cardiovascular;  Laterality: N/A;   VIDEO BRONCHOSCOPY WITH ENDOBRONCHIAL NAVIGATION N/A 03/15/2021   Procedure: VIDEO BRONCHOSCOPY WITH ENDOBRONCHIAL NAVIGATION;  Surgeon: Vida Rigger, MD;  Location: [facility] ORS;  Service: Thoracic;  Laterality: N/A;   VIDEO BRONCHOSCOPY WITH ENDOBRONCHIAL ULTRASOUND N/A 03/15/2021   Procedure: VIDEO BRONCHOSCOPY WITH ENDOBRONCHIAL ULTRASOUND;  Surgeon: Vida Rigger, MD;  Location: [facility] ORS;  Service: Thoracic;  Laterality: N/A;    SOCIAL HISTORY: Social History   Socioeconomic History   Marital status: Married    Spouse name: Darl Pikes   Number of children: 2   Years of education: Not on file   Highest education level: Not on file  Occupational History   Not on file  Tobacco Use   Smoking status: Never   Smokeless tobacco: Former    Types: Snuff, Chew  Vaping Use   Vaping status: Never Used  Substance and Sexual Activity   Alcohol use: Never   Drug use: Never   Sexual activity: Yes  Other Topics Concern   Not on file  Social History Narrative   [place]; with wife; 2 daughters[12 and 22]; never smoked; rare alcohol. 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 Dog and cat.   Social Determinants of Health   Financial Resource Strain: Low Risk  (05/10/2022)   Overall Financial Resource Strain (CARDIA)    Difficulty of Paying Living Expenses: Not hard at all  Food Insecurity: No Food Insecurity (05/10/2022)   Hunger Vital Sign    Worried About Running Out of Food in the  Last Year: Never true    Ran Out of Food in the Last Year: Never true  Transportation Needs: No Transportation Needs (03/16/2022)   PRAPARE - Administrator, Civil Service (Medical): No    Lack of Transportation (Non-Medical): No  Physical Activity: Not on file  Stress: Not on file  Social Connections: Socially Integrated (05/10/2022)   Social Connection and Isolation Panel [NHANES]     Frequency of Communication with Friends and Family: More than three times a week    Frequency of Social Gatherings with Friends and Family: More than three times a week    Attends Religious Services: More than 4 times per year    Active Member of [employer] or Organizations: Yes    Attends Engineer, structural: More than 4 times per year    Marital Status: Married  Catering manager Violence: Not on file    FAMILY HISTORY: Family History  Problem Relation Age of Onset   High Cholesterol Mother    Hypertension Father    Lung cancer Father    Skin cancer Father     ALLERGIES:  is allergic to codeine.  MEDICATIONS:  Current Outpatient Medications  Medication Sig Dispense Refill   albuterol (VENTOLIN HFA) 108 (90 Base) MCG/ACT inhaler Inhale 2 puffs into the lungs every 6 (six) hours as needed for wheezing or shortness of breath. 8 g 2   apixaban (ELIQUIS) 2.5 MG TABS tablet Take 1 tablet (2.5 mg total) by mouth 2 (two) times daily. 60 tablet 4   calcium carbonate (TUMS EX) 750 MG chewable tablet Chew 1 tablet by mouth daily.     ergocalciferol (VITAMIN D2) 1.25 MG (50000 UT) capsule Take 1 capsule (50,000 Units total) by mouth once a week. 12 capsule 1   folic acid (FOLVITE) 1 MG tablet TAKE 1 TABLET BY MOUTH EVERY DAY 90 tablet 1   lidocaine-prilocaine (EMLA) cream Apply 1 Application topically as needed. 30 g 0   metoprolol succinate (TOPROL-XL) 50 MG 24 hr tablet Take 50 mg by mouth daily. Take with or immediately following a meal.     predniSONE (DELTASONE) 20 MG tablet Recommend prednisone taper-start taking prednisone 3 pills for 1 week; and then 2 pills for 1 week; and then 1 pill a day-do not stop until further directions.  Take with breakfast daily. 100 tablet 0   sertraline (ZOLOFT) 50 MG tablet Take 1 tablet (50 mg total) by mouth daily. 30 tablet 1   No current facility-administered medications for this visit.   Facility-Administered Medications Ordered in Other Visits   Medication Dose Route Frequency Provider Last Rate Last Admin   heparin lock flush 100 UNIT/ML injection            heparin lock flush 100 UNIT/ML injection            sodium chloride flush (NS) 0.9 % injection 10 mL  10 mL Intravenous PRN [name], MD   10 mL at 07/28/21 0852      .  PHYSICAL EXAMINATION: ECOG PERFORMANCE STATUS: 1 - Symptomatic but completely ambulatory  Vitals:   04/30/23 0833  BP: (!) 126/91  Pulse: 75  Resp: 18  Temp: (!) 95.1 F (35.1 C)  SpO2: 97%       Filed Weights   04/30/23 0833  Weight: 233 lb 6.4 oz (105.9 kg)      Left cheek papular lesion noted.  No signs of infection.  Physical Exam HENT:  Head: Normocephalic and atraumatic.     Mouth/Throat:     Pharynx: No oropharyngeal exudate.  Eyes:     Pupils: Pupils are equal, round, and reactive to light.  Cardiovascular:     Rate and Rhythm: Normal rate and regular rhythm.  Pulmonary:     Effort: No respiratory distress.     Breath sounds: No wheezing.     Comments: Decreased breath sound on the left side compared to right. Abdominal:     General: Bowel sounds are normal. There is no distension.     Palpations: Abdomen is soft. There is no mass.     Tenderness: There is no abdominal tenderness. There is no guarding or rebound.  Musculoskeletal:        General: No tenderness. Normal range of motion.     Cervical back: Normal range of motion and neck supple.  Skin:    General: Skin is warm.  Neurological:     Mental Status: He is alert and oriented to person, place, and time.  Psychiatric:        Mood and Affect: Affect normal.    Fungal dermatitis bilateral buttocks groin/underneath abdominal pannus  LABORATORY DATA:  I have reviewed the data as listed Lab Results  Component Value Date   WBC 7.9 04/30/2023   HGB 13.7 04/30/2023   HCT 40.7 04/30/2023   MCV 96.0 04/30/2023   PLT 159 04/30/2023   Recent Labs    03/20/23 0801 04/09/23 0848  04/30/23 0820  NA 139 138 137  K 3.7 3.7 4.2  CL 105 104 106  CO2 28 26 26   GLUCOSE 86 131* 85  BUN 17 19 10   CREATININE 0.84 0.92 0.97  CALCIUM 8.6* 8.2* 8.5*  GFRNONAA >60 >60 >60  PROT 5.9* 5.8* 6.3*  ALBUMIN 3.3* 3.3* 3.4*  AST 78* 85* 70*  ALT 202* 197* 96*  ALKPHOS 109 127* 142*  BILITOT 0.8 1.0 0.9    RADIOGRAPHIC STUDIES: I have personally reviewed the radiological images as listed and agreed with the findings in the report. NM PET Image Restage (PS) Skull Base to Thigh (F-18 FDG)  Result Date: 04/27/2023 CLINICAL DATA:  Subsequent treatment strategy for non-small cell lung cancer with liver metastases. EXAM: NUCLEAR MEDICINE PET SKULL BASE TO THIGH TECHNIQUE: 13.0 mCi F-18 FDG was injected intravenously. Full-ring PET imaging was performed from the skull base to thigh after the radiotracer. CT data was obtained and used for attenuation correction and anatomic localization. Fasting blood glucose: 100 mg/dl COMPARISON:  MRI abdomen dated 02/03/2023.  PET-CT dated 01/31/2023. FINDINGS: Mediastinal blood pool activity: SUV max 2.6 Liver activity: SUV max NA NECK: No hypermetabolic cervical lymphadenopathy. Incidental CT findings: None. CHEST: Moderate chronic left pleural effusion with pleural thickening. Associated subpleural opacity in the left upper lobe, max SUV 5.0. No hypermetabolic thoracic lymphadenopathy. Right chest port terminates at the cavoatrial junction. Incidental CT findings: None. ABDOMEN/PELVIS: Heterogeneous hypermetabolism in the peripheral liver, similar to the prior. Some degree of residual tumor cannot be excluded, particularly along the posterior aspect of segment 7, max SUV 5.5. No abnormal metabolism in the spleen, pancreas, or adrenal glands. No metabolic abdominopelvic lymphadenopathy. Incidental CT findings: None. SKELETON: No focal hypermetabolic activity to suggest skeletal metastasis. Incidental CT findings: None. IMPRESSION: Subpleural left upper lobe  opacities, raising concern for recurrent/metastatic disease. Associated chronic left pleural effusion. Heterogeneous hypermetabolism in the peripheral liver, similar to the prior, suspicious for some degree of residual hepatic metastases. Electronically Signed   By: Lurlean Horns  Rito Ehrlich M.D.   On: 04/27/2023 05:07   MR Brain W Wo Contrast  Result Date: 04/25/2023 CLINICAL DATA:  Brain metastases, assess treatment response 3T SRS Protocol EXAM: MRI HEAD WITHOUT AND WITH CONTRAST TECHNIQUE: Multiplanar, multiecho pulse sequences of the brain and surrounding structures were obtained without and with intravenous contrast. CONTRAST:  10 mL Vueway COMPARISON:  MRI head Feb 18, 2023. FINDINGS: Brain: Decreased conspicuity of all lesions with some lesions described on the prior no longer visible and others less visible. No new lesions identified. Similar mild edema surrounding a high left frontal lesion. No significant mass effect. No evidence of acute infarct, acute hemorrhage, mass effect or midline shift. No hydrocephalus. Vascular: Major arterial flow voids are maintained at the skull base. Skull and upper cervical spine: Normal marrow signal. Sinuses/Orbits: Mostly clear sinuses.  No acute orbital findings. Other: No mastoid effusions IMPRESSION: Decreased conspicuity of all enhancing lesions with some lesions described on the prior no longer visible and others less visible. No new lesions identified. Electronically Signed   By: Feliberto Harts M.D.   On: 04/25/2023 15:32    ASSESSMENT & PLAN:   Cancer of lower lobe of left lung (HCC) # STAGE IV- [JUNE 2022] Left lower lobe lung cancer-adenocarcinoma the lung;  EGFR- 19 del POSITIVE. s /p second opinion at [facility]- Dr.Stinchcomb. status post 4 cycles of carbo Taxol-Atezo+Bev- JAN 25th, 2024-PET- Interval response to therapy with resolution of previously demonstrated extensive hypermetabolic activity throughout the liver.  PET scan April 17th- Unchanged appearance  of chronic loculated left pleural effusion/fibrothorax with new hypermetabolic activity of the pleura and adjacent consolidations, findings may be due to pseudoprogression or progressive disease. New irregular areas of hypermetabolic activity in the liver associated with previously described metastatic liver lesions, findings may be due to pseudoprogression or progressive disease. April 21st- liver MRI-  showed positive response to therapy when compared to prior abdominal MRI in terms of regression of diffuse hepatic metastatic disease.  Much of these imaging findings may be attributable to developing areas of hepatic fibrosis at the site of treated lesions, however, given the hypermetabolism in these regions on the recent PET-CT, some degree of residual tumor is not excluded. No new lesions are identified.   However clinically concerned about progression esp in liver- Discontinued Cycle #5  -Atezo + bev-given clinical concerns of progression [extreme fatigue rising LFTs].  # currently on Alimta+ M-vasi. JULY  13th, 2024- Subpleural left upper lobe opacities, raising concern for recurrent/metastatic disease. Associated chronic left pleural effusion. Heterogeneous hypermetabolism in the peripheral liver, similar to the prior, suspicious for some degree of residual hepatic metastases.   # proceed with  Alimta+ M-vasi s/p cyle #4. Labs-CBC/chemistries were reviewed with the patient.   # Elevated LFTs: Normal bilirubin-question related to immunotherapy versus progressive disease.  LFTs slightly imrpoved- s/p prednisone  currently on half a pill every other day-; recommend STOPPING prednisone. Monitor closely.   # Nose bleeds: multifactorial- avastin+ platelets-currently on low-dose of Eliquis given thrombocytopenia from chemotherapy.  Platelets improved however,  continue Eliquis at low-dose of 2.5 mg BID [currently once a day]- stable.   # Unexplained severe fatigue-unclear etiology s/p prednisone as above  improved.  Continue prednisone taper-as above monitor closely- American ginseng. Stable.   # Eye burning/tearing- on Patanol eye drop BID- stable.   #Asymptomatic multiple brain mets subcentimeter asymptomatic-  -s/p SBRT SEP 2023- [[place]].  S/p SBRT [[place]]- FEB 12th, 2024. July 9th, 2024-  Stable clinically. Decreased conspicuity of all enhancing  lesions with some lesions described on the prior no longer visible and others less visible. No new lesions identified  S/p evaluation with neuro-oncology- stable.   # bone metastases-Hypocalcemia: mild- continue calcium 1200 plus vitamin D-3 1000-over-the-counter-1 pill a day.  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 Continue dose of Eliquis 2.5 mg BID.[given nose bleeds]- see above.   #Bone metastases- CT DEc 7th, 2022 to healing process.  Dental evaluation on 9/21.  Continue calcium vitamin D intake. 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   [Tuesday/wed]- pref B12 #1 on 6/25  # DISPOSITION:  # chemo today- Alimta+ M-vasi;  # as per IS- Follow up in 3  weeks- MD; port-labs- cbc/cmp; CEA; UA; Alimta+ M-vasi;-Dr.B  # I reviewed the blood work- with the patient in detail; also reviewed the imaging independently [as summarized above]; and with the patient in detail.   # 40 minutes face-to-face with the patient discussing the above plan of care; more than 50% of time spent on prognosis/ natural history; counseling and coordination.    All questions were answered. The patient knows to call the clinic with any problems, questions or concerns.    [name], MD 04/30/2023 9:36 AM

## 2023-04-30 NOTE — Patient Instructions (Signed)

## 2023-05-01 LAB — CEA: CEA: 16.4 ng/mL — ABNORMAL HIGH (ref 0.0–4.7)

## 2023-05-06 ENCOUNTER — Telehealth: Payer: Self-pay | Admitting: *Deleted

## 2023-05-06 NOTE — Telephone Encounter (Signed)
Pt's wife left message asking if pt can take more than 2 tylenol per day for persistent headache. Or if Dr. Leonard Schwartz has any other recommendations for pt to take to help with headache.   Please advise.

## 2023-05-07 ENCOUNTER — Encounter: Payer: Self-pay | Admitting: Internal Medicine

## 2023-05-07 ENCOUNTER — Encounter: Payer: Self-pay | Admitting: Nurse Practitioner

## 2023-05-07 NOTE — Telephone Encounter (Signed)
Spoke with patient's wife-headache currently resolved-ibuprofen/Pepcid/caffeine.  Monitor for now. Will discuss with Dr.Vaslow.

## 2023-05-08 ENCOUNTER — Other Ambulatory Visit: Payer: Self-pay | Admitting: Radiation Therapy

## 2023-05-21 ENCOUNTER — Inpatient Hospital Stay (HOSPITAL_BASED_OUTPATIENT_CLINIC_OR_DEPARTMENT_OTHER): Payer: 59 | Admitting: Internal Medicine

## 2023-05-21 ENCOUNTER — Inpatient Hospital Stay: Payer: 59

## 2023-05-21 ENCOUNTER — Inpatient Hospital Stay: Payer: 59 | Attending: Internal Medicine

## 2023-05-21 VITALS — BP 126/91 | HR 65 | Temp 96.3°F | Ht 71.0 in | Wt 232.0 lb

## 2023-05-21 DIAGNOSIS — Z452 Encounter for adjustment and management of vascular access device: Secondary | ICD-10-CM | POA: Insufficient documentation

## 2023-05-21 DIAGNOSIS — G893 Neoplasm related pain (acute) (chronic): Secondary | ICD-10-CM | POA: Insufficient documentation

## 2023-05-21 DIAGNOSIS — M545 Low back pain, unspecified: Secondary | ICD-10-CM | POA: Insufficient documentation

## 2023-05-21 DIAGNOSIS — R1031 Right lower quadrant pain: Secondary | ICD-10-CM | POA: Diagnosis not present

## 2023-05-21 DIAGNOSIS — C3432 Malignant neoplasm of lower lobe, left bronchus or lung: Secondary | ICD-10-CM | POA: Insufficient documentation

## 2023-05-21 DIAGNOSIS — D61818 Other pancytopenia: Secondary | ICD-10-CM | POA: Diagnosis not present

## 2023-05-21 DIAGNOSIS — Z5112 Encounter for antineoplastic immunotherapy: Secondary | ICD-10-CM | POA: Diagnosis present

## 2023-05-21 DIAGNOSIS — R7989 Other specified abnormal findings of blood chemistry: Secondary | ICD-10-CM | POA: Insufficient documentation

## 2023-05-21 DIAGNOSIS — Z86718 Personal history of other venous thrombosis and embolism: Secondary | ICD-10-CM | POA: Insufficient documentation

## 2023-05-21 DIAGNOSIS — E871 Hypo-osmolality and hyponatremia: Secondary | ICD-10-CM | POA: Diagnosis not present

## 2023-05-21 DIAGNOSIS — R112 Nausea with vomiting, unspecified: Secondary | ICD-10-CM | POA: Insufficient documentation

## 2023-05-21 DIAGNOSIS — C7931 Secondary malignant neoplasm of brain: Secondary | ICD-10-CM | POA: Diagnosis not present

## 2023-05-21 DIAGNOSIS — Z5111 Encounter for antineoplastic chemotherapy: Secondary | ICD-10-CM | POA: Diagnosis present

## 2023-05-21 DIAGNOSIS — K5900 Constipation, unspecified: Secondary | ICD-10-CM | POA: Diagnosis not present

## 2023-05-21 DIAGNOSIS — C7951 Secondary malignant neoplasm of bone: Secondary | ICD-10-CM | POA: Diagnosis not present

## 2023-05-21 DIAGNOSIS — R040 Epistaxis: Secondary | ICD-10-CM | POA: Insufficient documentation

## 2023-05-21 DIAGNOSIS — Z7901 Long term (current) use of anticoagulants: Secondary | ICD-10-CM | POA: Insufficient documentation

## 2023-05-21 DIAGNOSIS — J90 Pleural effusion, not elsewhere classified: Secondary | ICD-10-CM | POA: Diagnosis not present

## 2023-05-21 DIAGNOSIS — C787 Secondary malignant neoplasm of liver and intrahepatic bile duct: Secondary | ICD-10-CM | POA: Insufficient documentation

## 2023-05-21 LAB — URINALYSIS, COMPLETE (UACMP) WITH MICROSCOPIC
Bilirubin Urine: NEGATIVE
Glucose, UA: NEGATIVE mg/dL
Hgb urine dipstick: NEGATIVE
Ketones, ur: NEGATIVE mg/dL
Leukocytes,Ua: NEGATIVE
Nitrite: NEGATIVE
Protein, ur: NEGATIVE mg/dL
Specific Gravity, Urine: 1.014 (ref 1.005–1.030)
Squamous Epithelial / HPF: NONE SEEN /HPF (ref 0–5)
pH: 5 (ref 5.0–8.0)

## 2023-05-21 LAB — CMP (CANCER CENTER ONLY)
ALT: 54 U/L — ABNORMAL HIGH (ref 0–44)
AST: 70 U/L — ABNORMAL HIGH (ref 15–41)
Albumin: 3.5 g/dL (ref 3.5–5.0)
Alkaline Phosphatase: 147 U/L — ABNORMAL HIGH (ref 38–126)
Anion gap: 7 (ref 5–15)
BUN: 10 mg/dL (ref 6–20)
CO2: 24 mmol/L (ref 22–32)
Calcium: 8.6 mg/dL — ABNORMAL LOW (ref 8.9–10.3)
Chloride: 105 mmol/L (ref 98–111)
Creatinine: 0.88 mg/dL (ref 0.61–1.24)
GFR, Estimated: >60 mL/min (ref >60)
Glucose, Bld: 86 mg/dL (ref 70–99)
Potassium: 3.7 mmol/L (ref 3.5–5.1)
Sodium: 136 mmol/L (ref 135–145)
Total Bilirubin: 0.6 mg/dL (ref 0.3–1.2)
Total Protein: 6.5 g/dL (ref 6.5–8.1)

## 2023-05-21 LAB — CBC WITH DIFFERENTIAL (CANCER CENTER ONLY)
Abs Immature Granulocytes: 0.18 10*3/uL — ABNORMAL HIGH (ref 0.00–0.07)
Basophils Absolute: 0.1 10*3/uL (ref 0.0–0.1)
Basophils Relative: 1 %
Eosinophils Absolute: 0.2 10*3/uL (ref 0.0–0.5)
Eosinophils Relative: 2 %
HCT: 40.8 % (ref 39.0–52.0)
Hemoglobin: 13.7 g/dL (ref 13.0–17.0)
Immature Granulocytes: 2 %
Lymphocytes Relative: 14 %
Lymphs Abs: 1.1 10*3/uL (ref 0.7–4.0)
MCH: 33.2 pg (ref 26.0–34.0)
MCHC: 33.6 g/dL (ref 30.0–36.0)
MCV: 98.8 fL (ref 80.0–100.0)
Monocytes Absolute: 1.2 10*3/uL — ABNORMAL HIGH (ref 0.1–1.0)
Monocytes Relative: 15 %
Neutro Abs: 5.1 10*3/uL (ref 1.7–7.7)
Neutrophils Relative %: 66 %
Platelet Count: 179 10*3/uL (ref 150–400)
RBC: 4.13 MIL/uL — ABNORMAL LOW (ref 4.22–5.81)
RDW: 18.9 % — ABNORMAL HIGH (ref 11.5–15.5)
WBC Count: 7.7 10*3/uL (ref 4.0–10.5)
nRBC: 0 % (ref 0.0–0.2)

## 2023-05-21 MED ORDER — PROCHLORPERAZINE MALEATE 10 MG PO TABS
10.0000 mg | ORAL_TABLET | Freq: Once | ORAL | Status: AC
  Administered 2023-05-21: 10 mg via ORAL
  Filled 2023-05-21: qty 1

## 2023-05-21 MED ORDER — SODIUM CHLORIDE 0.9 % IV SOLN
15.0000 mg/kg | Freq: Once | INTRAVENOUS | Status: AC
  Administered 2023-05-21: 1600 mg via INTRAVENOUS
  Filled 2023-05-21: qty 64

## 2023-05-21 MED ORDER — HEPARIN SOD (PORK) LOCK FLUSH 100 UNIT/ML IV SOLN
500.0000 [IU] | Freq: Once | INTRAVENOUS | Status: DC | PRN
  Filled 2023-05-21: qty 5

## 2023-05-21 MED ORDER — SODIUM CHLORIDE 0.9 % IV SOLN
500.0000 mg/m2 | Freq: Once | INTRAVENOUS | Status: AC
  Administered 2023-05-21: 1100 mg via INTRAVENOUS
  Filled 2023-05-21: qty 40

## 2023-05-21 MED ORDER — SODIUM CHLORIDE 0.9 % IV SOLN
Freq: Once | INTRAVENOUS | Status: AC
  Filled 2023-05-21: qty 250

## 2023-05-21 NOTE — Assessment & Plan Note (Addendum)
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 s /p second opinion at [facility]- Dr.Stinchcomb. status post 4 cycles of carbo Taxol-Atezo+Bev- JAN 25th, 2024-PET- Interval response to therapy with resolution of previously demonstrated extensive hypermetabolic activity throughout the liver.  PET scan April 17th- Unchanged appearance of chronic loculated left pleural effusion/fibrothorax with new hypermetabolic activity of the pleura and adjacent consolidations, findings may be due to pseudoprogression or progressive disease. New irregular areas of hypermetabolic activity in the liver associated with previously described metastatic liver lesions, findings may be due to pseudoprogression or progressive disease. April 21st- liver MRI-  showed positive response to therapy when compared to prior abdominal MRI in terms of regression of diffuse hepatic metastatic disease.  Much of these imaging findings may be attributable to developing areas of hepatic fibrosis at the site of treated lesions, however, given the hypermetabolism in these regions on the recent PET-CT, some degree of residual tumor is not excluded. No new lesions are identified.   However clinically concerned about progression esp in liver- Discontinued Cycle #5  -Atezo + bev-given clinical concerns of progression [extreme fatigue rising LFTs].  # currently on Alimta+ M-vasi. JULY  13th, 2024- Subpleural left upper lobe opacities, raising concern for recurrent/metastatic disease. Associated chronic left pleural effusion. Heterogeneous hypermetabolism in the peripheral liver, similar to the prior, suspicious for some degree of residual hepatic metastases.   # proceed with  Alimta+ M-vasi s/p cyle #5. Labs-CBC/chemistries were reviewed with the patient.   # Elevated LFTs: Normal bilirubin-question related to immunotherapy [likley] versus progressive disease.  LFTs slightly imrpoved- s/p prednisone- currently  off prednisone for last 2 weeks- Monitor closely.   # Nose bleeds: multifactorial- avastin+ platelets-currently on low-dose of Eliquis given thrombocytopenia from chemotherapy.   continue Eliquis at low-dose of 2.5 mg BID [currently once a day]- stable.   # Eye burning/tearing- on Patanol eye drop BID- stable.   #Asymptomatic multiple brain mets subcentimeter asymptomatic-  -s/p SBRT SEP 2023- [[place]].  S/p SBRT [[place]]- FEB 12th, 2024. July 9th, 2024-  Stable clinically. Decreased conspicuity of all enhancing lesions with some lesions described on the prior no longer visible and others less visible. No new lesions identified  S/p evaluation with neuro-oncology- stable. Discussed with Dr.Vaslow. will repeat imaging in OCT, 2024.   # bone metastases-Hypocalcemia: mild- continue calcium 1200 plus vitamin D-3 1000-over-the-counter-1 pill a day.  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 Continue dose of Eliquis 2.5 mg BID.[given nose bleeds]- see above. Stable  #Bone metastases- CT DEc 7th, 2022 to healing process.  Dental evaluation on 9/21.  Continue calcium vitamin D intake. 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   [Tuesday/wed]- pref B12  on 6/25  # DISPOSITION:  # chemo today- Alimta+ M-vasi;  # as per IS- Follow up in 3  weeks- MD; port-labs- cbc/cmp; CE[facility]; UA; Alimta+ M-vasi;b12 injection--Dr.B

## 2023-05-21 NOTE — Patient Instructions (Signed)
[facility]  Discharge Instructions: Thank you for choosing [facility] to provide your oncology and hematology care.  If you have a lab appointment with the Cancer Center, please go directly to the Cancer Center and check in at the registration area.  Wear comfortable clothing and clothing appropriate for easy access to any Portacath or PICC line.   We strive to give you quality time with your provider. You may need to reschedule your appointment if you arrive late (15 or more minutes).  Arriving late affects you and other patients whose appointments are after yours.  Also, if you miss three or more appointments without notifying the office, you may be dismissed from the clinic at the provider's discretion.      For prescription refill requests, have your pharmacy contact our office and allow 72 hours for refills to be completed.    Today you received the following chemotherapy and/or immunotherapy agents Mvasi & Alimta      To help prevent nausea and vomiting after your treatment, we encourage you to take your nausea medication as directed.  BELOW ARE SYMPTOMS THAT SHOULD BE REPORTED IMMEDIATELY: *FEVER GREATER THAN 100.4 F (38 C) OR HIGHER *CHILLS OR SWEATING *NAUSEA AND VOMITING THAT IS NOT CONTROLLED WITH YOUR NAUSEA MEDICATION *UNUSUAL SHORTNESS OF BREATH *UNUSUAL BRUISING OR BLEEDING *URINARY PROBLEMS (pain or burning when urinating, or frequent urination) *BOWEL PROBLEMS (unusual diarrhea, constipation, pain near the anus) TENDERNESS IN MOUTH AND THROAT WITH OR WITHOUT PRESENCE OF ULCERS (sore throat, sores in mouth, or a toothache) UNUSUAL RASH, SWELLING OR PAIN  UNUSUAL VAGINAL DISCHARGE OR ITCHING   Items with * indicate a potential emergency and should be followed up as soon as possible or go to the Emergency Department if any problems should occur.  Please show the CHEMOTHERAPY ALERT CARD or IMMUNOTHERAPY ALERT CARD at  check-in to the Emergency Department and triage nurse.  Should you have questions after your visit or need to cancel or reschedule your appointment, please contact [facility]  [phone] and follow the prompts.  Office hours are 8:00 a.m. to 4:30 p.m. Monday - Friday. Please note that voicemails left after 4:00 p.m. may not be returned until the following business day.  We are closed weekends and major holidays. You have access to a nurse at all times for urgent questions. Please call the main number to the clinic [phone] and follow the prompts.  For any non-urgent questions, you may also contact your provider using MyChart. We now offer e-Visits for anyone 68 and older to request care online for non-urgent symptoms. For details visit [email or web address].   Also download the MyChart app! Go to the app store, search "MyChart", open the app, select [facility], and log in with your MyChart username and password.

## 2023-05-21 NOTE — Progress Notes (Signed)
No concerns today 

## 2023-05-21 NOTE — Patient Instructions (Signed)

## 2023-05-21 NOTE — Progress Notes (Signed)
[facility] CONSULT NOTE  Patient Care Team: Pcp, No as PCP - General [name], RN as Oncology Nurse Navigator [name], MD as Consulting Physician (Internal Medicine)  CHIEF COMPLAINTS/PURPOSE OF CONSULTATION: Lung cancer  #  Oncology History Overview Note  IMPRESSION: 1. Patchy nodular fat stranding throughout the anterior left upper quadrant peritoneal fat, nonspecific, cannot exclude peritoneal carcinomatosis. Dedicated CT abdomen/pelvis with oral and IV contrast recommended for further evaluation. 2. Dense patchy consolidation replacing much of the left lower lung lobe, appearing masslike in the superior segment left lower lobe, with associated bulging of the left major fissure and associated left lower lobe volume loss. Fine nodularity throughout both lungs with an upper lobe predominance. Asymmetric left upper lobe interlobular septal thickening. These findings are indeterminate, with differential including multilobar pneumonia, sarcoidosis or a neoplastic process. The persistence on radiographs back to 01/20/2021 despite antibiotic therapy make sarcoidosis or a neoplastic process more likely. Pulmonology consultation suggested for consideration of bronchoscopic evaluation. 3. Small dependent left pleural effusion. 4. Mild mediastinal lymphadenopathy, nonspecific. 5. Subacute healing lateral right sixth rib fracture.   DIAGNOSIS:  A. LUNG, LEFT LOWER LOBE; ENB-ASSISTED BIOPSY:  - NON-SMALL CELL CARCINOMA, FAVOR ADENOCARCINOMA.  - FOREIGN MATERIAL SUGGESTIVE OF ASPIRATION.   # EGFR MUTATED: 26 del- June 28th, 2022- [name]. [limited]  # Asymptomatic multiple brain mets subcentimeter asymptomatic -JUNE 29th, 2023- Numerous metastatic lesions in the supratentorial brain-increase in number also conspicuity compared to January 2023.  Pre-SBRT MRI brain AUG 18th, 2023-  Eighteen small enhancing brain metastases (punctate to 11 mm); were all  present on 03/28/2021-s/p SBRT SEP 2023- [[place]]; SRS/ radiation treatment for brain metastasis He finished treatment on 11-26-22.   # SEP-OCT-worsening left-sided pleural effusion-status post paracentesis; exudative; Cytology negative- ?  Progressive disease.  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  While on osimertinib- NOV 1st, 2023- PET scan- shows progressive disease in liver. Liquid Biopsy/Guardant testing-positive for EGFR mutation; otherwise Negative for any novel mutations.  Repeat liver Biopsy positive for adenocarcinoma. NEGATIVE for MET amplification.  S/p second opinion at [facility] Dr.Stinchcomb. status post 4 cycles of carbo Taxol-Atezo+Bev- JAN 25th, 2024-PET- Interval response to therapy with resolution of previously demonstrated extensive hypermetabolic activity throughout the liver. PARIL 2024- ? Partial response, but clinically worse/rising LFTs  # MAY 6th, Brain MRI-progressive multiple brain metastases  # MAY 16th, 2024- Alimta+Avastin #1  # MAY 16th, 2024-    Cancer of lower lobe of left lung (HCC)  03/23/2021 Initial Diagnosis   Cancer of lower lobe of left lung (HCC)   03/29/2021 Cancer Staging   Staging form: Lung, AJCC 8th Edition - Clinical: Stage IVB (cT3, cN3, pM1c) - Signed by [name], MD on 03/29/2021   03/30/2021 - 03/30/2021 Chemotherapy   Patient is on Treatment Plan : LUNG NSCLC Pemetrexed (Alimta) / Carboplatin q21d x 1 cycles     08/17/2022 - 01/15/2023 Chemotherapy   Patient is on Treatment Plan : LUNG Atezolizumab + Bevacizumab + Carboplatin + Paclitaxel q21d Induction x 4 cycles / Atezolizumab + Bevacizumab q21d Maintenance     02/28/2023 -  Chemotherapy   Patient is on Treatment Plan : LUNG NON-SMALL CELL Bevacizumab + Pemetrexed q21d      HISTORY OF PRESENTING ILLNESS: Ambulating independently.  Accompanied by his dad.   [name] 42 y.o.   male patient with stage IV lung cancer adenocarcinoma-brain mets [synchronous]  bone mets EGFR mutated -currently on Alimta- Bev is here for follow-up. Patient currently on Alimta+ M-vasi s/p cycle #4- appx 3 weeks ago.   Patient noted to have headaches after stopping pepsi. Improved after -restarting pepsi.   Currently on  Eliquis 2.5mg  qd instead of bid due to bleeding from his nose.    Shortness of breath coughing improved. Denies any abdominal pain nausea vomiting.  No worsening pain.   No worsening skin rash.   Review of Systems  Constitutional:  Positive for malaise/fatigue. Negative for chills, diaphoresis and fever.  HENT:  Negative for nosebleeds and sore throat.   Eyes:  Negative for double vision.  Respiratory:  Positive for cough. Negative for wheezing.   Cardiovascular:  Negative for chest pain, palpitations, orthopnea and leg swelling.  Gastrointestinal:  Positive for heartburn and nausea. Negative for abdominal pain, blood in stool, diarrhea and melena.  Genitourinary:  Negative for dysuria, frequency and urgency.  Musculoskeletal:  Negative for back pain and joint pain.  Skin:  Negative for itching.  Neurological:  Negative for tingling and focal weakness.  Endo/Heme/Allergies:  Does not bruise/bleed easily.  Psychiatric/Behavioral:  Negative for depression. The patient is not nervous/anxious and does not have insomnia.      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    SURGICAL HISTORY: Past Surgical History:  Procedure Laterality Date   FRACTURE SURGERY     broke femur when he was 6 years   PORTA CATH INSERTION N/A 03/28/2021   Procedure: PORTA CATH INSERTION;  Surgeon: Renford Dills, MD;  Location: [facility] INVASIVE CV LAB;  Service: Cardiovascular;  Laterality: N/A;   VIDEO BRONCHOSCOPY WITH ENDOBRONCHIAL NAVIGATION N/A 03/15/2021    Procedure: VIDEO BRONCHOSCOPY WITH ENDOBRONCHIAL NAVIGATION;  Surgeon: Vida Rigger, MD;  Location: [facility] ORS;  Service: Thoracic;  Laterality: N/A;   VIDEO BRONCHOSCOPY WITH ENDOBRONCHIAL ULTRASOUND N/A 03/15/2021   Procedure: VIDEO BRONCHOSCOPY WITH ENDOBRONCHIAL ULTRASOUND;  Surgeon: Vida Rigger, MD;  Location: [facility] ORS;  Service: Thoracic;  Laterality: N/A;    SOCIAL HISTORY: Social History   Socioeconomic History   Marital status: Married    Spouse name: Darl Pikes   Number of children: 2   Years of education: Not on file   Highest education level: Not on file  Occupational History   Not on file  Tobacco Use   Smoking status: Never   Smokeless tobacco: Former    Types: Snuff, Chew  Vaping Use   Vaping status: Never Used  Substance and Sexual Activity   Alcohol use: Never   Drug use: Never   Sexual activity: Yes  Other Topics Concern   Not on file  Social History Narrative   [place]; with wife; 2 daughters[12 and 22]; never smoked; rare alcohol. 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 Dog and cat.   Social Determinants of Health   Financial Resource Strain: Low Risk  (05/10/2022)   Overall Financial Resource Strain (CARDIA)    Difficulty of Paying Living Expenses: Not hard at all  Food Insecurity: No Food Insecurity (05/10/2022)   Hunger Vital Sign    Worried About Running Out of Food in the Last Year: Never true    Ran Out of Food in the Last Year: Never true  Transportation Needs: No Transportation Needs (03/16/2022)   PRAPARE - Transportation    Lack  of Transportation (Medical): No    Lack of Transportation (Non-Medical): No  Physical Activity: Not on file  Stress: Not on file  Social Connections: Socially Integrated (05/10/2022)   Social Connection and Isolation Panel [NHANES]    Frequency of Communication with Friends and Family: More than three times a week    Frequency of Social Gatherings with Friends and Family: More than three times a week    Attends Religious Services:  More than 4 times per year    Active Member of [employer] or Organizations: Yes    Attends Engineer, structural: More than 4 times per year    Marital Status: Married  Catering manager Violence: Not on file    FAMILY HISTORY: Family History  Problem Relation Age of Onset   High Cholesterol Mother    Hypertension Father    Lung cancer Father    Skin cancer Father     ALLERGIES:  is allergic to codeine.  MEDICATIONS:  Current Outpatient Medications  Medication Sig Dispense Refill   albuterol (VENTOLIN HFA) 108 (90 Base) MCG/ACT inhaler Inhale 2 puffs into the lungs every 6 (six) hours as needed for wheezing or shortness of breath. 8 g 2   apixaban (ELIQUIS) 2.5 MG TABS tablet Take 1 tablet (2.5 mg total) by mouth 2 (two) times daily. 60 tablet 4   calcium carbonate (TUMS EX) 750 MG chewable tablet Chew 1 tablet by mouth daily.     ergocalciferol (VITAMIN D2) 1.25 MG (50000 UT) capsule Take 1 capsule (50,000 Units total) by mouth once a week. 12 capsule 1   folic acid (FOLVITE) 1 MG tablet TAKE 1 TABLET BY MOUTH EVERY DAY 90 tablet 1   lidocaine-prilocaine (EMLA) cream Apply 1 Application topically as needed. 30 g 0   metoprolol succinate (TOPROL-XL) 50 MG 24 hr tablet Take 50 mg by mouth daily. Take with or immediately following a meal.     sertraline (ZOLOFT) 50 MG tablet Take 1 tablet (50 mg total) by mouth daily. 30 tablet 1   No current facility-administered medications for this visit.   Facility-Administered Medications Ordered in Other Visits  Medication Dose Route Frequency Provider Last Rate Last Admin   heparin lock flush 100 UNIT/ML injection            heparin lock flush 100 UNIT/ML injection            sodium chloride flush (NS) 0.9 % injection 10 mL  10 mL Intravenous PRN [name], MD   10 mL at 07/28/21 0852      .  PHYSICAL EXAMINATION: ECOG PERFORMANCE STATUS: 1 - Symptomatic but completely ambulatory  Vitals:   05/21/23 0841  BP: (!)  126/91  Pulse: 65  Temp: (!) 96.3 F (35.7 C)  SpO2: 95%        Filed Weights   05/21/23 0841  Weight: 232 lb (105.2 kg)       Left cheek papular lesion noted.  No signs of infection.  Physical Exam HENT:     Head: Normocephalic and atraumatic.     Mouth/Throat:     Pharynx: No oropharyngeal exudate.  Eyes:     Pupils: Pupils are equal, round, and reactive to light.  Cardiovascular:     Rate and Rhythm: Normal rate and regular rhythm.  Pulmonary:     Effort: No respiratory distress.     Breath sounds: No wheezing.     Comments: Decreased breath sound on the left side compared to right.  Abdominal:     General: Bowel sounds are normal. There is no distension.     Palpations: Abdomen is soft. There is no mass.     Tenderness: There is no abdominal tenderness. There is no guarding or rebound.  Musculoskeletal:        General: No tenderness. Normal range of motion.     Cervical back: Normal range of motion and neck supple.  Skin:    General: Skin is warm.  Neurological:     Mental Status: He is alert and oriented to person, place, and time.  Psychiatric:        Mood and Affect: Affect normal.    Fungal dermatitis bilateral buttocks groin/underneath abdominal pannus  LABORATORY DATA:  I have reviewed the data as listed Lab Results  Component Value Date   WBC 7.7 05/21/2023   HGB 13.7 05/21/2023   HCT 40.8 05/21/2023   MCV 98.8 05/21/2023   PLT 179 05/21/2023   Recent Labs    04/09/23 0848 04/30/23 0820 05/21/23 0841  NA 138 137 136  K 3.7 4.2 3.7  CL 104 106 105  CO2 26 26 24   GLUCOSE 131* 85 86  BUN 19 10 10   CREATININE 0.92 0.97 0.88  CALCIUM 8.2* 8.5* 8.6*  GFRNONAA >60 >60 >60  PROT 5.8* 6.3* 6.5  ALBUMIN 3.3* 3.4* 3.5  AST 85* 70* 70*  ALT 197* 96* 54*  ALKPHOS 127* 142* 147*  BILITOT 1.0 0.9 0.6    RADIOGRAPHIC STUDIES: I have personally reviewed the radiological images as listed and agreed with the findings in the report. NM PET  Image Restage (PS) Skull Base to Thigh (F-18 FDG)  Result Date: 04/27/2023 CLINICAL DATA:  Subsequent treatment strategy for non-small cell lung cancer with liver metastases. EXAM: NUCLEAR MEDICINE PET SKULL BASE TO THIGH TECHNIQUE: 13.0 mCi F-18 FDG was injected intravenously. Full-ring PET imaging was performed from the skull base to thigh after the radiotracer. CT data was obtained and used for attenuation correction and anatomic localization. Fasting blood glucose: 100 mg/dl COMPARISON:  MRI abdomen dated 02/03/2023.  PET-CT dated 01/31/2023. FINDINGS: Mediastinal blood pool activity: SUV max 2.6 Liver activity: SUV max NA NECK: No hypermetabolic cervical lymphadenopathy. Incidental CT findings: None. CHEST: Moderate chronic left pleural effusion with pleural thickening. Associated subpleural opacity in the left upper lobe, max SUV 5.0. No hypermetabolic thoracic lymphadenopathy. Right chest port terminates at the cavoatrial junction. Incidental CT findings: None. ABDOMEN/PELVIS: Heterogeneous hypermetabolism in the peripheral liver, similar to the prior. Some degree of residual tumor cannot be excluded, particularly along the posterior aspect of segment 7, max SUV 5.5. No abnormal metabolism in the spleen, pancreas, or adrenal glands. No metabolic abdominopelvic lymphadenopathy. Incidental CT findings: None. SKELETON: No focal hypermetabolic activity to suggest skeletal metastasis. Incidental CT findings: None. IMPRESSION: Subpleural left upper lobe opacities, raising concern for recurrent/metastatic disease. Associated chronic left pleural effusion. Heterogeneous hypermetabolism in the peripheral liver, similar to the prior, suspicious for some degree of residual hepatic metastases. Electronically Signed   By: Charline Bills M.D.   On: 04/27/2023 05:07   MR Brain W Wo Contrast  Result Date: 04/25/2023 CLINICAL DATA:  Brain metastases, assess treatment response 3T SRS Protocol EXAM: MRI HEAD WITHOUT  AND WITH CONTRAST TECHNIQUE: Multiplanar, multiecho pulse sequences of the brain and surrounding structures were obtained without and with intravenous contrast. CONTRAST:  10 mL Vueway COMPARISON:  MRI head Feb 18, 2023. FINDINGS: Brain: Decreased conspicuity of all lesions with some lesions described on  the prior no longer visible and others less visible. No new lesions identified. Similar mild edema surrounding a high left frontal lesion. No significant mass effect. No evidence of acute infarct, acute hemorrhage, mass effect or midline shift. No hydrocephalus. Vascular: Major arterial flow voids are maintained at the skull base. Skull and upper cervical spine: Normal marrow signal. Sinuses/Orbits: Mostly clear sinuses.  No acute orbital findings. Other: No mastoid effusions IMPRESSION: Decreased conspicuity of all enhancing lesions with some lesions described on the prior no longer visible and others less visible. No new lesions identified. Electronically Signed   By: Feliberto Harts M.D.   On: 04/25/2023 15:32    ASSESSMENT & PLAN:   Cancer of lower lobe of left lung (HCC) # STAGE IV- [JUNE 2022] Left lower lobe lung cancer-adenocarcinoma the lung;  EGFR- 19 del POSITIVE. s /p second opinion at [facility]- Dr.Stinchcomb. status post 4 cycles of carbo Taxol-Atezo+Bev- JAN 25th, 2024-PET- Interval response to therapy with resolution of previously demonstrated extensive hypermetabolic activity throughout the liver.  PET scan April 17th- Unchanged appearance of chronic loculated left pleural effusion/fibrothorax with new hypermetabolic activity of the pleura and adjacent consolidations, findings may be due to pseudoprogression or progressive disease. New irregular areas of hypermetabolic activity in the liver associated with previously described metastatic liver lesions, findings may be due to pseudoprogression or progressive disease. April 21st- liver MRI-  showed positive response to therapy when compared to prior  abdominal MRI in terms of regression of diffuse hepatic metastatic disease.  Much of these imaging findings may be attributable to developing areas of hepatic fibrosis at the site of treated lesions, however, given the hypermetabolism in these regions on the recent PET-CT, some degree of residual tumor is not excluded. No new lesions are identified.   However clinically concerned about progression esp in liver- Discontinued Cycle #5  -Atezo + bev-given clinical concerns of progression [extreme fatigue rising LFTs].  # currently on Alimta+ M-vasi. JULY  13th, 2024- Subpleural left upper lobe opacities, raising concern for recurrent/metastatic disease. Associated chronic left pleural effusion. Heterogeneous hypermetabolism in the peripheral liver, similar to the prior, suspicious for some degree of residual hepatic metastases.   # proceed with  Alimta+ M-vasi s/p cyle #5. Labs-CBC/chemistries were reviewed with the patient.   # Elevated LFTs: Normal bilirubin-question related to immunotherapy [likley] versus progressive disease.  LFTs slightly imrpoved- s/p prednisone- currently off prednisone for last 2 weeks- Monitor closely.   # Nose bleeds: multifactorial- avastin+ platelets-currently on low-dose of Eliquis given thrombocytopenia from chemotherapy.   continue Eliquis at low-dose of 2.5 mg BID [currently once a day]- stable.   # Eye burning/tearing- on Patanol eye drop BID- stable.   #Asymptomatic multiple brain mets subcentimeter asymptomatic-  -s/p SBRT SEP 2023- [[place]].  S/p SBRT [[place]]- FEB 12th, 2024. July 9th, 2024-  Stable clinically. Decreased conspicuity of all enhancing lesions with some lesions described on the prior no longer visible and others less visible. No new lesions identified  S/p evaluation with neuro-oncology- stable. Discussed with Dr.Vaslow. will repeat imaging in OCT, 2024.   # bone metastases-Hypocalcemia: mild- continue calcium 1200 plus vitamin D-3 1000-over-the-counter-1  pill a day.  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 Continue dose of Eliquis 2.5 mg BID.[given nose bleeds]- see above. Stable  #  Bone metastases- CT DEc 7th, 2022 to healing process.  Dental evaluation on 9/21.  Continue calcium vitamin D intake. 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   [Tuesday/wed]- pref B12  on 6/25  # DISPOSITION:  # chemo today- Alimta+ M-vasi;  # as per IS- Follow up in 3  weeks- MD; port-labs- cbc/cmp; CEA; UA; Alimta+ M-vasi;b12 injection--Dr.B     All questions were answered. The patient knows to call the clinic with any problems, questions or concerns.    [name], MD 05/21/2023 9:39 AM

## 2023-05-27 ENCOUNTER — Inpatient Hospital Stay: Payer: 59

## 2023-05-27 ENCOUNTER — Other Ambulatory Visit: Payer: Self-pay

## 2023-05-27 ENCOUNTER — Inpatient Hospital Stay (HOSPITAL_BASED_OUTPATIENT_CLINIC_OR_DEPARTMENT_OTHER): Payer: 59 | Admitting: Hospice and Palliative Medicine

## 2023-05-27 ENCOUNTER — Encounter: Payer: Self-pay | Admitting: Hospice and Palliative Medicine

## 2023-05-27 ENCOUNTER — Other Ambulatory Visit: Payer: Managed Care, Other (non HMO)

## 2023-05-27 ENCOUNTER — Ambulatory Visit: Admission: RE | Admit: 2023-05-27 | Payer: Managed Care, Other (non HMO) | Source: Ambulatory Visit

## 2023-05-27 ENCOUNTER — Ambulatory Visit
Admission: RE | Admit: 2023-05-27 | Discharge: 2023-05-27 | Disposition: A | Discharge status: Home / Self Care | Payer: Managed Care, Other (non HMO) | Source: Ambulatory Visit | Attending: Hospice and Palliative Medicine | Admitting: Hospice and Palliative Medicine

## 2023-05-27 ENCOUNTER — Encounter: Payer: Self-pay | Admitting: Internal Medicine

## 2023-05-27 VITALS — BP 137/100 | HR 98 | Temp 96.9°F | Resp 22 | Ht 71.0 in | Wt 222.8 lb

## 2023-05-27 DIAGNOSIS — R17 Unspecified jaundice: Secondary | ICD-10-CM

## 2023-05-27 DIAGNOSIS — Z5112 Encounter for antineoplastic immunotherapy: Secondary | ICD-10-CM | POA: Diagnosis not present

## 2023-05-27 DIAGNOSIS — R110 Nausea: Secondary | ICD-10-CM

## 2023-05-27 DIAGNOSIS — E860 Dehydration: Secondary | ICD-10-CM

## 2023-05-27 DIAGNOSIS — C3432 Malignant neoplasm of lower lobe, left bronchus or lung: Secondary | ICD-10-CM | POA: Insufficient documentation

## 2023-05-27 LAB — CBC WITH DIFFERENTIAL/PLATELET
Abs Immature Granulocytes: 0.04 10*3/uL (ref 0.00–0.07)
Basophils Absolute: 0 10*3/uL (ref 0.0–0.1)
Basophils Relative: 1 %
Eosinophils Absolute: 0.1 10*3/uL (ref 0.0–0.5)
Eosinophils Relative: 1 %
HCT: 37.9 % — ABNORMAL LOW (ref 39.0–52.0)
Hemoglobin: 13 g/dL (ref 13.0–17.0)
Immature Granulocytes: 1 %
Lymphocytes Relative: 11 %
Lymphs Abs: 0.7 10*3/uL (ref 0.7–4.0)
MCH: 33.9 pg (ref 26.0–34.0)
MCHC: 34.3 g/dL (ref 30.0–36.0)
MCV: 98.7 fL (ref 80.0–100.0)
Monocytes Absolute: 0.3 10*3/uL (ref 0.1–1.0)
Monocytes Relative: 4 %
Neutro Abs: 5.6 10*3/uL (ref 1.7–7.7)
Neutrophils Relative %: 82 %
Platelets: 100 10*3/uL — ABNORMAL LOW (ref 150–400)
RBC: 3.84 MIL/uL — ABNORMAL LOW (ref 4.22–5.81)
RDW: 16.5 % — ABNORMAL HIGH (ref 11.5–15.5)
Smear Review: NORMAL
WBC: 6.7 10*3/uL (ref 4.0–10.5)
nRBC: 0 % (ref 0.0–0.2)

## 2023-05-27 LAB — COMPREHENSIVE METABOLIC PANEL
ALT: 58 U/L — ABNORMAL HIGH (ref 0–44)
AST: 76 U/L — ABNORMAL HIGH (ref 15–41)
Albumin: 3.4 g/dL — ABNORMAL LOW (ref 3.5–5.0)
Alkaline Phosphatase: 150 U/L — ABNORMAL HIGH (ref 38–126)
Anion gap: 9 (ref 5–15)
BUN: 11 mg/dL (ref 6–20)
CO2: 25 mmol/L (ref 22–32)
Calcium: 8.8 mg/dL — ABNORMAL LOW (ref 8.9–10.3)
Chloride: 96 mmol/L — ABNORMAL LOW (ref 98–111)
Creatinine, Ser: 0.81 mg/dL (ref 0.61–1.24)
GFR, Estimated: >60 mL/min (ref >60)
Glucose, Bld: 136 mg/dL — ABNORMAL HIGH (ref 70–99)
Potassium: 3.5 mmol/L (ref 3.5–5.1)
Sodium: 130 mmol/L — ABNORMAL LOW (ref 135–145)
Total Bilirubin: 2.7 mg/dL — ABNORMAL HIGH (ref 0.3–1.2)
Total Protein: 6.9 g/dL (ref 6.5–8.1)

## 2023-05-27 LAB — LIPASE, BLOOD: Lipase: 35 U/L (ref 11–51)

## 2023-05-27 LAB — AMYLASE: Amylase: 52 U/L (ref 28–100)

## 2023-05-27 LAB — MAGNESIUM: Magnesium: 1.7 mg/dL (ref 1.7–2.4)

## 2023-05-27 MED ORDER — SODIUM CHLORIDE 0.9 % IV SOLN
INTRAVENOUS | Status: DC
  Filled 2023-05-27 (×2): qty 250

## 2023-05-27 MED ORDER — TRAMADOL HCL 50 MG PO TABS: 50.0000 mg | ORAL_TABLET | Freq: Two times a day (BID) | ORAL | 0 refills | Status: DC | PRN

## 2023-05-27 MED ORDER — SODIUM CHLORIDE 0.9% FLUSH
10.0000 mL | Freq: Once | INTRAVENOUS | Status: DC
  Filled 2023-05-27: qty 10

## 2023-05-27 MED ORDER — IOHEXOL 300 MG/ML  SOLN
100.0000 mL | Freq: Once | INTRAMUSCULAR | Status: AC | PRN
  Administered 2023-05-27: 100 mL via INTRAVENOUS

## 2023-05-27 MED ORDER — HEPARIN SOD (PORK) LOCK FLUSH 100 UNIT/ML IV SOLN
500.0000 [IU] | Freq: Once | INTRAVENOUS | Status: AC
  Administered 2023-05-27: 500 [IU] via INTRAVENOUS

## 2023-05-27 MED ORDER — HEPARIN SOD (PORK) LOCK FLUSH 100 UNIT/ML IV SOLN
500.0000 [IU] | Freq: Once | INTRAVENOUS | Status: DC
  Filled 2023-05-27: qty 5

## 2023-05-27 MED ORDER — ONDANSETRON HCL 4 MG/2ML IJ SOLN
8.0000 mg | Freq: Once | INTRAMUSCULAR | Status: AC
  Administered 2023-05-27: 8 mg via INTRAVENOUS
  Filled 2023-05-27: qty 4

## 2023-05-27 NOTE — Progress Notes (Unsigned)
Symptom Management Clinic [facility] at [facility] [phone] [phone]  Patient Care Team: Pcp, No as PCP - General [name], RN as Oncology Nurse Navigator [name], MD as Consulting Physician (Internal Medicine)   NAME OF PATIENT: [name]  [id number]  December 20, 1980   DATE OF VISIT: 05/27/23  REASON FOR CONSULT: [name] is a 42 y.o. 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  Most recently on treatment with Alimta+ M-vasi.  INTERVAL HISTORY: Patient last saw Dr. Donneta Romberg on 05/21/2023 for cycle 5 Alimta+ M-vasi.  Patient presents to clinic today with complaint of about a week of lower quadrant abdominal pain, poor oral intake, nausea with a couple of episodes of vomiting.  Also with low back pain. Overall, he just feels poorly.    Denies recent fevers or illnesses. Denies any easy bleeding or bruising. Denies chest pain. Denies urinary complaints. Patient offers no further specific complaints today.  Of note, patient says that he stopped all of his oral medications including his Eliquis last week.  Does not give explanation of why.   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   PORTA CATH INSERTION N/A 03/28/2021   Procedure: PORTA CATH INSERTION;  Surgeon: Renford Dills, MD;  Location: [facility] INVASIVE CV [facility];  Service: Cardiovascular;  Laterality: N/A;   VIDEO BRONCHOSCOPY WITH ENDOBRONCHIAL NAVIGATION N/A 03/15/2021   Procedure: VIDEO BRONCHOSCOPY WITH ENDOBRONCHIAL NAVIGATION;  Surgeon: Vida Rigger, MD;  Location: [facility] ORS;  Service: Thoracic;  Laterality: N/A;    VIDEO BRONCHOSCOPY WITH ENDOBRONCHIAL ULTRASOUND N/A 03/15/2021   Procedure: VIDEO BRONCHOSCOPY WITH ENDOBRONCHIAL ULTRASOUND;  Surgeon: Vida Rigger, MD;  Location: [facility] ORS;  Service: Thoracic;  Laterality: N/A;    HEMATOLOGY/ONCOLOGY HISTORY:  Oncology History Overview Note  IMPRESSION: 1. Patchy nodular fat stranding throughout the anterior left upper quadrant peritoneal fat, nonspecific, cannot exclude peritoneal carcinomatosis. Dedicated CT abdomen/pelvis with oral and IV contrast recommended for further evaluation. 2. Dense patchy consolidation replacing much of the left lower lung lobe, appearing masslike in the superior segment left lower lobe, with associated bulging of the left major fissure and associated left lower lobe volume loss. Fine nodularity throughout both lungs with an upper lobe predominance. Asymmetric left upper lobe interlobular septal thickening. These findings are indeterminate, with differential including multilobar pneumonia, sarcoidosis or a neoplastic process. The persistence on radiographs back to 01/20/2021 despite antibiotic therapy make sarcoidosis or a neoplastic process more likely. Pulmonology consultation suggested for consideration of bronchoscopic evaluation. 3. Small dependent left pleural effusion. 4. Mild mediastinal lymphadenopathy, nonspecific. 5. Subacute healing lateral right sixth rib fracture.   DIAGNOSIS:  A. LUNG, LEFT LOWER LOBE; ENB-ASSISTED BIOPSY:  - NON-SMALL CELL CARCINOMA, FAVOR ADENOCARCINOMA.  - FOREIGN MATERIAL SUGGESTIVE OF ASPIRATION.   # EGFR MUTATED: 32 del- June 28th, 2022- [name]. [limited]  # Asymptomatic multiple brain mets subcentimeter asymptomatic -JUNE 29th, 2023- Numerous metastatic lesions in the supratentorial brain-increase in number also conspicuity compared to January 2023.  Pre-SBRT MRI brain AUG 18th, 2023-  Eighteen small enhancing brain metastases (punctate to 11 mm); were all  present on  03/28/2021-s/p SBRT SEP 2023- [[place]]; SRS/ radiation treatment for brain metastasis He finished treatment on 11-26-22.   # SEP-OCT-worsening left-sided pleural effusion-status post paracentesis; exudative; Cytology negative- ?  Progressive disease.  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  While on osimertinib- NOV 1st, 2023- PET scan- shows progressive disease in liver. Liquid Biopsy/Guardant testing-positive for EGFR mutation; otherwise Negative for any novel mutations.  Repeat liver Biopsy positive for adenocarcinoma. NEGATIVE for MET amplification.  S/p [facility] opinion at [facility]- Dr.Stinchcomb. status post 4 cycles of carbo Taxol-Atezo+Bev- JAN 25th, 2024-PET- Interval response to therapy with resolution of previously demonstrated extensive hypermetabolic activity throughout the liver. PARIL 2024- ? Partial response, but clinically worse/rising LFTs  # MAY 6th, Brain MRI-progressive multiple brain metastases  # MAY 16th, 2024- Alimta+Avastin #1  # MAY 16th, 2024-    Cancer of lower lobe of left lung (HCC)  03/23/2021 Initial Diagnosis   Cancer of lower lobe of left lung (HCC)   03/29/2021 Cancer Staging   Staging form: Lung, AJCC 8th Edition - Clinical: Stage IVB (cT3, cN3, pM1c) - Signed by [name], MD on 03/29/2021   03/30/2021 - 03/30/2021 Chemotherapy   Patient is on Treatment Plan : LUNG NSCLC Pemetrexed (Alimta) / Carboplatin q21d x 1 cycles     08/17/2022 - 01/15/2023 Chemotherapy   Patient is on Treatment Plan : LUNG Atezolizumab + Bevacizumab + Carboplatin + Paclitaxel q21d Induction x 4 cycles / Atezolizumab + Bevacizumab q21d Maintenance     02/28/2023 -  Chemotherapy   Patient is on Treatment Plan : LUNG NON-SMALL CELL Bevacizumab + Pemetrexed q21d       ALLERGIES:  is allergic to codeine.  MEDICATIONS:  Current Outpatient Medications  Medication Sig Dispense Refill    albuterol (VENTOLIN HFA) 108 (90 Base) MCG/ACT inhaler Inhale 2 puffs into the lungs every 6 (six) hours as needed for wheezing or shortness of breath. (Patient not taking: Reported on 05/27/2023) 8 g 2   apixaban (ELIQUIS) 2.5 MG TABS tablet Take 1 tablet (2.5 mg total) by mouth 2 (two) times daily. (Patient not taking: Reported on 05/27/2023) 60 tablet 4   calcium carbonate (TUMS EX) 750 MG chewable tablet Chew 1 tablet by mouth daily. (Patient not taking: Reported on 05/27/2023)     ergocalciferol (VITAMIN D2) 1.25 MG (50000 UT) capsule Take 1 capsule (50,000 Units total) by mouth once a week. (Patient not taking: Reported on 05/27/2023) 12 capsule 1   folic acid (FOLVITE) 1 MG tablet TAKE 1 TABLET BY MOUTH EVERY DAY (Patient not taking: Reported on 05/27/2023) 90 tablet 1   lidocaine-prilocaine (EMLA) cream Apply 1 Application topically as needed. (Patient not taking: Reported on 05/27/2023) 30 g 0   metoprolol succinate (TOPROL-XL) 50 MG 24 hr tablet Take 50 mg by mouth daily. Take with or immediately following a meal. (Patient not taking: Reported on 05/27/2023)     sertraline (ZOLOFT) 50 MG tablet Take 1 tablet (50 mg total) by mouth daily. (Patient not taking: Reported on 05/27/2023) 30 tablet 1   No current facility-administered medications for this visit.   Facility-Administered Medications Ordered in Other Visits  Medication Dose Route Frequency Provider Last Rate Last Admin   heparin lock flush 100 UNIT/ML injection            heparin lock flush 100 UNIT/ML injection  sodium chloride flush (NS) 0.9 % injection 10 mL  10 mL Intravenous PRN Louretta Shorten R, MD   10 mL at 07/28/21 0852    VITAL SIGNS: BP (!) 137/100   Pulse 98   Temp (!) 96.9 F (36.1 C) (Tympanic)   Resp (!) 22   Ht 5\' 11"  (1.803 m)   Wt 222 lb 12.8 oz (101.1 kg)   BMI 31.07 kg/m  Filed Weights   05/27/23 0953  Weight: 222 lb 12.8 oz (101.1 kg)    Estimated body mass index is 31.07 kg/m as  calculated from the following:   Height as of this encounter: 5\' 11"  (1.803 m).   Weight as of this encounter: 222 lb 12.8 oz (101.1 kg).  LABS: CBC:    Component Value Date/Time   WBC 6.7 05/27/2023 0943   HGB 13.0 05/27/2023 0943   HGB 13.7 05/21/2023 0842   HCT 37.9 (L) 05/27/2023 0943   PLT 100 (L) 05/27/2023 0943   PLT 179 05/21/2023 0842   MCV 98.7 05/27/2023 0943   NEUTROABS 5.6 05/27/2023 0943   LYMPHSABS 0.7 05/27/2023 0943   MONOABS 0.3 05/27/2023 0943   EOSABS 0.1 05/27/2023 0943   BASOSABS 0.0 05/27/2023 0943   Comprehensive Metabolic Panel:    Component Value Date/Time   NA 130 (L) 05/27/2023 0943   K 3.5 05/27/2023 0943   CL 96 (L) 05/27/2023 0943   CO2 25 05/27/2023 0943   BUN 11 05/27/2023 0943   CREATININE 0.81 05/27/2023 0943   CREATININE 0.88 05/21/2023 0841   GLUCOSE 136 (H) 05/27/2023 0943   CALCIUM 8.8 (L) 05/27/2023 0943   AST 76 (H) 05/27/2023 0943   AST 70 (H) 05/21/2023 0841   ALT 58 (H) 05/27/2023 0943   ALT 54 (H) 05/21/2023 0841   ALKPHOS 150 (H) 05/27/2023 0943   BILITOT 2.7 (H) 05/27/2023 0943   BILITOT 0.6 05/21/2023 0841   PROT 6.9 05/27/2023 0943   ALBUMIN 3.4 (L) 05/27/2023 0943    RADIOGRAPHIC STUDIES: No results found.  PERFORMANCE STATUS (ECOG) : 1 - Symptomatic but completely ambulatory  Review of Systems Unless otherwise noted, a complete review of systems is negative.  Physical Exam General: NAD Cardiovascular: regular rate and rhythm Pulmonary: clear ant fields Abdomen: soft, nontender, + bowel sounds GU: no suprapubic tenderness Extremities: no edema, no joint deformities Skin: no rashes Neurological: Weakness but otherwise nonfocal  IMPRESSION/PLAN: Stage IV non-small cell lung cancer with brain/bone metastasis -PET scan 04/22/23 showing L. Upper lobe opacities concerning for recurrent metastatic disease. Also with heterogeneous hypermetabolism in the liver suspicious for residual hepatic metastasis.   Abd/Back  pain - unclear etiology given recent PET scan. Note hyperbilirubinemia today. Discussed with Dr. Donneta Romberg and will obtain CT abdomen and pelvis for further workup.   Hyponatremia - probable dehydration. Proceed with IV fluids today.   Neoplasm related pain -refill tramadol   RTC later this week for repeat labs/fluids  Patient expressed understanding and was in agreement with this plan. He also understands that He can call clinic at any time with any questions, concerns, or complaints.   Thank you for allowing me to participate in the care of this very pleasant patient.   Time Total: 15 minutes  Visit consisted of counseling and education dealing with the complex and emotionally intense issues of symptom management in the setting of serious illness.Greater than 50%  of this time was spent counseling and coordinating care related to the above assessment and plan.  Signed by:  Laurette Schimke, PhD, NP-C

## 2023-05-28 ENCOUNTER — Encounter: Payer: Self-pay | Admitting: Internal Medicine

## 2023-05-28 ENCOUNTER — Ambulatory Visit: Admission: RE | Admit: 2023-05-28 | Payer: Managed Care, Other (non HMO) | Source: Ambulatory Visit

## 2023-05-28 ENCOUNTER — Encounter: Payer: Self-pay | Admitting: *Deleted

## 2023-05-28 ENCOUNTER — Ambulatory Visit
Admission: RE | Admit: 2023-05-28 | Discharge: 2023-05-28 | Disposition: A | Discharge status: Home / Self Care | Payer: Managed Care, Other (non HMO) | Source: Ambulatory Visit | Attending: Hospice and Palliative Medicine

## 2023-05-28 ENCOUNTER — Encounter: Payer: Self-pay | Admitting: Hospice and Palliative Medicine

## 2023-05-28 ENCOUNTER — Encounter: Payer: Self-pay | Admitting: Nurse Practitioner

## 2023-05-28 ENCOUNTER — Ambulatory Visit
Admission: RE | Admit: 2023-05-28 | Discharge: 2023-05-28 | Disposition: A | Discharge status: Home / Self Care | Payer: Managed Care, Other (non HMO) | Source: Ambulatory Visit | Attending: Student

## 2023-05-28 VITALS — BP 136/94 | HR 89

## 2023-05-28 DIAGNOSIS — J90 Pleural effusion, not elsewhere classified: Secondary | ICD-10-CM | POA: Diagnosis not present

## 2023-05-28 DIAGNOSIS — C3432 Malignant neoplasm of lower lobe, left bronchus or lung: Secondary | ICD-10-CM | POA: Insufficient documentation

## 2023-05-28 DIAGNOSIS — C3492 Malignant neoplasm of unspecified part of left bronchus or lung: Secondary | ICD-10-CM

## 2023-05-28 MED ORDER — METRONIDAZOLE 500 MG PO TABS: 500.0000 mg | ORAL_TABLET | Freq: Three times a day (TID) | ORAL | 0 refills | Status: DC

## 2023-05-28 MED ORDER — CIPROFLOXACIN HCL 500 MG PO TABS: 500.0000 mg | ORAL_TABLET | Freq: Two times a day (BID) | ORAL | 0 refills | Status: DC

## 2023-05-28 MED ORDER — LIDOCAINE HCL (PF) 1 % IJ SOLN
10.0000 mL | Freq: Once | INTRAMUSCULAR | Status: AC
  Administered 2023-05-28: 10 mL via INTRADERMAL

## 2023-05-28 NOTE — Procedures (Signed)
PROCEDURE SUMMARY:  Successful US guided left thoracentesis. Yielded 350 ml of red/brown fluid. Pt tolerated procedure well. No immediate complications.  Specimen not sent for labs. CXR ordered; no post-procedure pneumothorax identified.   EBL < 2 mL  Mickie Kay, NP 05/28/2023 4:12 PM

## 2023-05-28 NOTE — Progress Notes (Signed)
Per Elouise Munroe, NP - pt will need thoracentesis for left pleural effusion to be scheduled. Also, needs follow up later this week on Thurs or Fri to follow up with labs and IV fluids. Orders placed and message sent to scheduling. Pt will be called with appts once scheduled.

## 2023-05-30 ENCOUNTER — Inpatient Hospital Stay (HOSPITAL_BASED_OUTPATIENT_CLINIC_OR_DEPARTMENT_OTHER): Payer: 59 | Admitting: Hospice and Palliative Medicine

## 2023-05-30 ENCOUNTER — Encounter: Payer: Self-pay | Admitting: Hospice and Palliative Medicine

## 2023-05-30 ENCOUNTER — Inpatient Hospital Stay: Payer: 59

## 2023-05-30 ENCOUNTER — Other Ambulatory Visit: Payer: Self-pay

## 2023-05-30 VITALS — BP 129/92 | HR 73 | Temp 97.6°F | Resp 20 | Ht 71.0 in | Wt 222.2 lb

## 2023-05-30 DIAGNOSIS — E860 Dehydration: Secondary | ICD-10-CM | POA: Diagnosis not present

## 2023-05-30 DIAGNOSIS — C3432 Malignant neoplasm of lower lobe, left bronchus or lung: Secondary | ICD-10-CM

## 2023-05-30 DIAGNOSIS — J90 Pleural effusion, not elsewhere classified: Secondary | ICD-10-CM

## 2023-05-30 DIAGNOSIS — Z5112 Encounter for antineoplastic immunotherapy: Secondary | ICD-10-CM | POA: Diagnosis not present

## 2023-05-30 LAB — CBC WITH DIFFERENTIAL (CANCER CENTER ONLY)
Abs Immature Granulocytes: 0.12 10*3/uL — ABNORMAL HIGH (ref 0.00–0.07)
Basophils Absolute: 0 10*3/uL (ref 0.0–0.1)
Basophils Relative: 2 %
Eosinophils Absolute: 0.1 10*3/uL (ref 0.0–0.5)
Eosinophils Relative: 2 %
HCT: 35.9 % — ABNORMAL LOW (ref 39.0–52.0)
Hemoglobin: 12.2 g/dL — ABNORMAL LOW (ref 13.0–17.0)
Immature Granulocytes: 4 %
Lymphocytes Relative: 30 %
Lymphs Abs: 0.8 10*3/uL (ref 0.7–4.0)
MCH: 33.7 pg (ref 26.0–34.0)
MCHC: 34 g/dL (ref 30.0–36.0)
MCV: 99.2 fL (ref 80.0–100.0)
Monocytes Absolute: 0.8 10*3/uL (ref 0.1–1.0)
Monocytes Relative: 28 %
Neutro Abs: 0.9 10*3/uL — ABNORMAL LOW (ref 1.7–7.7)
Neutrophils Relative %: 34 %
Platelet Count: 75 10*3/uL — ABNORMAL LOW (ref 150–400)
RBC: 3.62 MIL/uL — ABNORMAL LOW (ref 4.22–5.81)
RDW: 16 % — ABNORMAL HIGH (ref 11.5–15.5)
WBC Count: 2.7 10*3/uL — ABNORMAL LOW (ref 4.0–10.5)
nRBC: 0 % (ref 0.0–0.2)

## 2023-05-30 LAB — CMP (CANCER CENTER ONLY)
ALT: 48 U/L — ABNORMAL HIGH (ref 0–44)
AST: 65 U/L — ABNORMAL HIGH (ref 15–41)
Albumin: 3.1 g/dL — ABNORMAL LOW (ref 3.5–5.0)
Alkaline Phosphatase: 153 U/L — ABNORMAL HIGH (ref 38–126)
Anion gap: 7 (ref 5–15)
BUN: 9 mg/dL (ref 6–20)
CO2: 26 mmol/L (ref 22–32)
Calcium: 8.5 mg/dL — ABNORMAL LOW (ref 8.9–10.3)
Chloride: 100 mmol/L (ref 98–111)
Creatinine: 0.74 mg/dL (ref 0.61–1.24)
GFR, Estimated: >60 mL/min (ref >60)
Glucose, Bld: 123 mg/dL — ABNORMAL HIGH (ref 70–99)
Potassium: 3.5 mmol/L (ref 3.5–5.1)
Sodium: 133 mmol/L — ABNORMAL LOW (ref 135–145)
Total Bilirubin: 1 mg/dL (ref 0.3–1.2)
Total Protein: 6.5 g/dL (ref 6.5–8.1)

## 2023-05-30 MED ORDER — HEPARIN SOD (PORK) LOCK FLUSH 100 UNIT/ML IV SOLN
500.0000 [IU] | Freq: Once | INTRAVENOUS | Status: AC
  Administered 2023-05-30: 500 [IU] via INTRAVENOUS
  Filled 2023-05-30: qty 5

## 2023-05-30 MED ORDER — SODIUM CHLORIDE 0.9 % IV SOLN
INTRAVENOUS | Status: DC
  Filled 2023-05-30 (×2): qty 250

## 2023-05-30 MED ORDER — SODIUM CHLORIDE 0.9% FLUSH
10.0000 mL | Freq: Once | INTRAVENOUS | Status: AC
  Administered 2023-05-30: 10 mL via INTRAVENOUS
  Filled 2023-05-30: qty 10

## 2023-05-30 NOTE — Progress Notes (Signed)
Symptom Management Clinic [facility] at [place] [phone]) [phone] [phone]) [phone]  Patient Care Team: Pcp, No as PCP - General [name], RN as Oncology Nurse Navigator [name], MD as Consulting Physician (Internal Medicine)   NAME OF PATIENT: [name]  [id number]  [birth year]   DATE OF VISIT: 05/30/23  REASON FOR CONSULT: [name] is a 42 y.o. 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  Most recently on treatment with Alimta+ M-vasi.  INTERVAL HISTORY: Patient last saw Dr. Donneta Romberg on 05/21/2023 for cycle 5 Alimta+ M-vasi.  Seen in [facility] on 05/27/2023 with lower abdominal pain and intermittent vomiting. CT showed large right-sided pleural effusion and thickening of the colon concerning for possible colitis.  Patient was started empirically on Cipro and Flagyl.  He underwent left-sided thoracentesis on 05/28/2023 with approximately 350 mL fluid removed.  Patient presents to clinic today for follow-up.  Today, patient reports he is feeling better.  He still has occasional pain in the lower abdomen but that that has improved.  He has used as needed tramadol with good effect.  He has not had any additional vomiting.  However, he says his appetite remains poor.  He endorses constipation.  Denies recent fevers or illnesses. Denies any easy bleeding or bruising. Denies chest pain. Denies urinary complaints. Patient offers no further specific complaints today.  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   PORTA CATH INSERTION N/A 03/28/2021   Procedure: PORTA CATH INSERTION;  Surgeon: Renford Dills, MD;  Location: [facility] INVASIVE CV LAB;  Service: Cardiovascular;  Laterality: N/A;   VIDEO BRONCHOSCOPY WITH ENDOBRONCHIAL NAVIGATION N/A 03/15/2021   Procedure: VIDEO BRONCHOSCOPY WITH ENDOBRONCHIAL NAVIGATION;  Surgeon: Vida Rigger, MD;  Location: [facility] ORS;  Service: Thoracic;  Laterality: N/A;   VIDEO BRONCHOSCOPY WITH ENDOBRONCHIAL ULTRASOUND N/A 03/15/2021   Procedure: VIDEO BRONCHOSCOPY WITH ENDOBRONCHIAL ULTRASOUND;  Surgeon: Vida Rigger, MD;  Location: [facility] ORS;  Service: Thoracic;  Laterality: N/A;    HEMATOLOGY/ONCOLOGY HISTORY:  Oncology History Overview Note  IMPRESSION: 1. Patchy nodular fat stranding throughout the anterior left upper quadrant peritoneal fat, nonspecific, cannot exclude peritoneal carcinomatosis. Dedicated CT abdomen/pelvis with oral and IV contrast recommended for further evaluation. 2. Dense patchy consolidation replacing much of the left lower lung lobe, appearing masslike in the superior segment left lower lobe, with associated bulging of the left major fissure and associated left lower lobe volume loss. Fine nodularity throughout both lungs with an upper lobe predominance. Asymmetric left upper lobe interlobular septal thickening. These findings are indeterminate, with differential including multilobar pneumonia, sarcoidosis or a neoplastic process. The persistence on radiographs back to 01/20/2021 despite antibiotic therapy make sarcoidosis or a neoplastic process more likely. Pulmonology consultation suggested for consideration of bronchoscopic evaluation. 3. Small dependent left pleural effusion. 4. Mild mediastinal lymphadenopathy, nonspecific. 5. Subacute healing lateral right sixth rib fracture.   DIAGNOSIS:  A. LUNG, LEFT LOWER LOBE; ENB-ASSISTED BIOPSY:  - NON-SMALL CELL CARCINOMA, FAVOR ADENOCARCINOMA.  - FOREIGN MATERIAL SUGGESTIVE OF ASPIRATION.   # EGFR MUTATED: 73 del- June 28th, 2022- [name]. [limited]  #  Asymptomatic multiple brain mets subcentimeter asymptomatic -JUNE 29th, 2023- Numerous metastatic lesions in the supratentorial brain-increase in number also conspicuity compared to January 2023.  Pre-SBRT MRI brain AUG 18th, 2023-  Eighteen small enhancing brain metastases (punctate to 11 mm); were all present on 03/28/2021-s/p SBRT SEP 2023- [[place]]; SRS/ radiation treatment for brain metastasis He finished treatment on 11-26-22.   # SEP-OCT-worsening left-sided pleural effusion-status post paracentesis; exudative; Cytology negative- ?  Progressive disease.  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  While on osimertinib- NOV 1st, 2023- PET scan- shows progressive disease in liver. Liquid Biopsy/Guardant testing-positive for EGFR mutation; otherwise Negative for any novel mutations.  Repeat liver Biopsy positive for adenocarcinoma. NEGATIVE for MET amplification.  S/p second opinion at [facility]- Dr.Stinchcomb. status post 4 cycles of carbo Taxol-Atezo+Bev- JAN 25th, 2024-PET- Interval response to therapy with resolution of previously demonstrated extensive hypermetabolic activity throughout the liver. PARIL 2024- ? Partial response, but clinically worse/rising LFTs  # MAY 6th, Brain MRI-progressive multiple brain metastases  # MAY 16th, 2024- Alimta+Avastin #1  # MAY 16th, 2024-    Cancer of lower lobe of left lung (HCC)  03/23/2021 Initial Diagnosis   Cancer of lower lobe of left lung (HCC)   03/29/2021 Cancer Staging   Staging form: Lung, AJCC 8th Edition - Clinical: Stage IVB (cT3, cN3, pM1c) - Signed by [name], MD on 03/29/2021   03/30/2021 - 03/30/2021 Chemotherapy   Patient is on Treatment Plan : LUNG NSCLC Pemetrexed (Alimta) / Carboplatin q21d x 1 cycles     08/17/2022 - 01/15/2023 Chemotherapy   Patient is on Treatment Plan : LUNG Atezolizumab + Bevacizumab + Carboplatin + Paclitaxel q21d  Induction x 4 cycles / Atezolizumab + Bevacizumab q21d Maintenance     02/28/2023 -  Chemotherapy   Patient is on Treatment Plan : LUNG NON-SMALL CELL Bevacizumab + Pemetrexed q21d       ALLERGIES:  is allergic to codeine.  MEDICATIONS:  Current Outpatient Medications  Medication Sig Dispense Refill   albuterol (VENTOLIN HFA) 108 (90 Base) MCG/ACT inhaler Inhale 2 puffs into the lungs every 6 (six) hours as needed for wheezing or shortness of breath. (Patient not taking: Reported on 05/27/2023) 8 g 2   apixaban (ELIQUIS) 2.5 MG TABS tablet Take 1 tablet (2.5 mg total) by mouth 2 (two) times daily. (Patient not taking: Reported on 05/27/2023) 60 tablet 4   calcium carbonate (TUMS EX) 750 MG chewable tablet Chew 1 tablet by mouth daily. (Patient not taking: Reported on 05/27/2023)     ciprofloxacin (CIPRO) 500 MG tablet Take 1 tablet (500 mg total) by mouth 2 (two) times daily. 14 tablet 0   ergocalciferol (VITAMIN D2) 1.25 MG (50000 UT) capsule Take 1 capsule (50,000 Units total) by mouth once a week. (Patient not taking: Reported on 05/27/2023) 12 capsule 1   folic acid (FOLVITE) 1 MG tablet TAKE 1 TABLET BY MOUTH EVERY DAY (Patient not taking: Reported on 05/27/2023) 90 tablet 1   lidocaine-prilocaine (EMLA) cream Apply 1 Application topically as needed. (Patient not taking: Reported on 05/27/2023) 30 g 0   metoprolol succinate (TOPROL-XL) 50 MG 24 hr tablet Take 50 mg by mouth daily. Take with or immediately following a meal. (Patient not taking: Reported on 05/27/2023)     metroNIDAZOLE (FLAGYL) 500 MG tablet Take 1 tablet (500 mg total) by mouth 3 (three) times daily. 15 tablet 0  sertraline (ZOLOFT) 50 MG tablet Take 1 tablet (50 mg total) by mouth daily. (Patient not taking: Reported on 05/27/2023) 30 tablet 1   traMADol (ULTRAM) 50 MG tablet Take 1 tablet (50 mg total) by mouth every 12 (twelve) hours as needed. 30 tablet 0   No current facility-administered medications for this visit.    Facility-Administered Medications Ordered in Other Visits  Medication Dose Route Frequency Provider Last Rate Last Admin   heparin lock flush 100 UNIT/ML injection            heparin lock flush 100 UNIT/ML injection            heparin lock flush 100 unit/mL  500 Units Intravenous Once , Daryl Eastern, NP       sodium chloride flush (NS) 0.9 % injection 10 mL  10 mL Intravenous PRN [name], MD   10 mL at 07/28/21 0852    VITAL SIGNS: BP (!) 129/92   Pulse 73   Temp 97.6 F (36.4 C) (Tympanic)   Resp 20   Ht 5\' 11"  (1.803 m)   Wt 222 lb 3.2 oz (100.8 kg)   BMI 30.99 kg/m  Filed Weights   05/30/23 1315  Weight: 222 lb 3.2 oz (100.8 kg)    Estimated body mass index is 30.99 kg/m as calculated from the following:   Height as of this encounter: 5\' 11"  (1.803 m).   Weight as of this encounter: 222 lb 3.2 oz (100.8 kg).  LABS: CBC:    Component Value Date/Time   WBC 2.7 (L) 05/30/2023 1305   WBC 6.7 05/27/2023 0943   HGB 12.2 (L) 05/30/2023 1305   HCT 35.9 (L) 05/30/2023 1305   PLT 75 (L) 05/30/2023 1305   MCV 99.2 05/30/2023 1305   NEUTROABS 0.9 (L) 05/30/2023 1305   LYMPHSABS 0.8 05/30/2023 1305   MONOABS 0.8 05/30/2023 1305   EOSABS 0.1 05/30/2023 1305   BASOSABS 0.0 05/30/2023 1305   Comprehensive Metabolic Panel:    Component Value Date/Time   NA 130 (L) 05/27/2023 0943   K 3.5 05/27/2023 0943   CL 96 (L) 05/27/2023 0943   CO2 25 05/27/2023 0943   BUN 11 05/27/2023 0943   CREATININE 0.81 05/27/2023 0943   CREATININE 0.88 05/21/2023 0841   GLUCOSE 136 (H) 05/27/2023 0943   CALCIUM 8.8 (L) 05/27/2023 0943   AST 76 (H) 05/27/2023 0943   AST 70 (H) 05/21/2023 0841   ALT 58 (H) 05/27/2023 0943   ALT 54 (H) 05/21/2023 0841   ALKPHOS 150 (H) 05/27/2023 0943   BILITOT 2.7 (H) 05/27/2023 0943   BILITOT 0.6 05/21/2023 0841   PROT 6.9 05/27/2023 0943   ALBUMIN 3.4 (L) 05/27/2023 0943    RADIOGRAPHIC STUDIES: US THORACENTESIS ASP PLEURAL SPACE  W/IMG GUIDE  Result Date: 05/28/2023 INDICATION: Patient with a history of left lung cancer with recurrent pleural effusion. Conventional radiology asked to perform a therapeutic thoracentesis. EXAM: ULTRASOUND GUIDED THORACENTESIS MEDICATIONS: 1% lidocaine 10 mL COMPLICATIONS: None immediate. PROCEDURE: An ultrasound guided thoracentesis was thoroughly discussed with the patient and questions answered. The benefits, risks, alternatives and complications were also discussed. The patient understands and wishes to proceed with the procedure. Written consent was obtained. Ultrasound was performed to localize and mark an adequate pocket of fluid in the left chest. The area was then prepped and draped in the normal sterile fashion. 1% Lidocaine was used for local anesthesia. Under ultrasound guidance a 6 Fr Safe-T-Centesis catheter was introduced. Thoracentesis was performed.  The catheter was removed and a dressing applied. FINDINGS: A total of approximately 350 mL of red/brown pleural fluid was removed. IMPRESSION: Successful ultrasound guided left thoracentesis yielding 350 mL of pleural fluid. Procedure performed by Alwyn Ren NP Electronically Signed   By: Olive Bass M.D.   On: 05/28/2023 16:16   DG Chest 1 View  Result Date: 05/28/2023 CLINICAL DATA:  Status post thoracentesis EXAM: CHEST  1 VIEW COMPARISON:  07/26/2022 FINDINGS: Cardiomediastinal silhouette and pulmonary vasculature are within normal limits. Mild interval decrease of left pleural effusion. There is no pneumothorax. Right chest port is unchanged in position. IMPRESSION: No pneumothorax status post left thoracentesis. Electronically Signed   By: Acquanetta Belling M.D.   On: 05/28/2023 16:09   CT ABDOMEN PELVIS W CONTRAST  Result Date: 05/27/2023 CLINICAL DATA:  Sudden onset right lower quadrant pain last week. History of lung cancer EXAM: CT ABDOMEN AND PELVIS WITH CONTRAST TECHNIQUE: Multidetector CT imaging of the abdomen and pelvis  was performed using the standard protocol following bolus administration of intravenous contrast. RADIATION DOSE REDUCTION: This exam was performed according to the departmental dose-optimization program which includes automated exposure control, adjustment of the mA and/or kV according to patient size and/or use of iterative reconstruction technique. CONTRAST:  OMNIPAQUE IOHEXOL 300 MG/ML  SOLN COMPARISON:  PET-CT 04/22/2023 MRI 02/03/2023.  Older exams as well FINDINGS: Lower chest: Once again there is a large loculated right-sided pleural effusion with pleural thickening and adjacent lung opacity. Some volume loss as well. Interstitial septal thickening in the right lung base. No right-sided effusion. Hepatobiliary: Slightly nodular contours of the liver. Patent portal vein. There are some geographic areas of low-density seen along the superior margin of the liver towards the dome and again along segment 4/5. There are some areas of capsular retraction as well. The liver contours are similar to the previous examination. The subtle areas of lesion on the previous examination are simply not as well defined on this standard postcontrast CT without dynamic. Please correlate for known liver metastases gallbladder is mildly distended. Main and right portal veins are patent. There is truncated appearance of the left portal vein. Partial thrombus. Pancreas: Unremarkable. No pancreatic ductal dilatation or surrounding inflammatory changes. Spleen: Cephalocaudal length of 14.7 cm, enlarged. Preserved enhancement. Adrenals/Urinary Tract: Adrenal glands are preserved. No enhancing renal mass or collecting system dilatation. The ureters have normal course and caliber extending down to the bladder. Preserved contours of the urinary bladder Stomach/Bowel: The bowel on this non oral contrast examination is nondilated. This includes the stomach and small bowel. Large bowel is nondilated but there is some areas of wall  thickening along left side of the colon. There are some scattered mesenteric stranding. Please correlate for any clinical evidence of colitis. Normal appendix in the right lower quadrant. Vascular/Lymphatic: No significant vascular findings are present. No enlarged abdominal or pelvic lymph nodes. Few varices. Reproductive: Prostate is unremarkable. Other: Mesenteric stranding identified.  No ascites or free air Musculoskeletal: Curvature and degenerative changes along the spine and pelvis. There is partial sacralization of the right side of L5. IMPRESSION: Nodular liver with fatty infiltration and splenomegaly. There are once again are some geographic areas of low-density in the liver with some capsular retraction. Please correlate for known liver metastases. These are less well defined on this standard postcontrast CT. No bowel obstruction or free air. There is some subtle wall thickening along left side of the colon. A subtle colitis is not excluded. Please correlate with symptoms.  Normal appendix. Persistent large loculated left pleural effusion with pleural thickening. The main and right portal veins are patent. The left portal vein appears somewhat small, atretic. Partial thrombus is possible Electronically Signed   By: Karen Kays M.D.   On: 05/27/2023 12:48    PERFORMANCE STATUS (ECOG) : 1 - Symptomatic but completely ambulatory  Review of Systems Unless otherwise noted, a complete review of systems is negative.  Physical Exam General: NAD Cardiovascular: regular rate and rhythm Pulmonary: clear ant fields Abdomen: soft, nontender, + bowel sounds GU: no suprapubic tenderness Extremities: no edema, no joint deformities Skin: no rashes Neurological: Weakness but otherwise nonfocal  IMPRESSION/PLAN: Stage IV non-small cell lung cancer with brain/bone metastasis -PET scan 04/22/23 showing L. Upper lobe opacities concerning for recurrent metastatic disease. Also with heterogeneous hypermetabolism  in the liver suspicious for residual hepatic metastasis.  Follow up with Dr. Donneta Romberg on 8/27.  Abd pain -improved today. CT showed possible colitis.  Continue Cipro and Flagyl.  Constipation -recommended MiraLAX/senna  Pancytopenia-likely secondary to chemotherapy.  Should be at or close to nadir.  He is empirically on antibiotics for possible colitis.  Follow labs.  Neoplasm related pain -continue tramadol   RTC next week for labs/fluid clinic   Patient expressed understanding and was in agreement with this plan. He also understands that He can call clinic at any time with any questions, concerns, or complaints.   Thank you for allowing me to participate in the care of this very pleasant patient.   Time Total: 15 minutes  Visit consisted of counseling and education dealing with the complex and emotionally intense issues of symptom management in the setting of serious illness.Greater than 50%  of this time was spent counseling and coordinating care related to the above assessment and plan.  Signed by: Laurette Schimke, PhD, NP-C

## 2023-05-30 NOTE — Telephone Encounter (Signed)
Seeing [name] today.

## 2023-05-31 ENCOUNTER — Other Ambulatory Visit: Payer: Managed Care, Other (non HMO)

## 2023-05-31 ENCOUNTER — Encounter: Payer: Managed Care, Other (non HMO) | Admitting: Hospice and Palliative Medicine

## 2023-05-31 ENCOUNTER — Ambulatory Visit: Payer: Managed Care, Other (non HMO)

## 2023-06-03 ENCOUNTER — Other Ambulatory Visit: Payer: Self-pay

## 2023-06-03 ENCOUNTER — Encounter: Payer: Self-pay | Admitting: Hospice and Palliative Medicine

## 2023-06-03 ENCOUNTER — Ambulatory Visit
Admission: RE | Admit: 2023-06-03 | Discharge: 2023-06-03 | Disposition: A | Discharge status: Home / Self Care | Payer: Managed Care, Other (non HMO) | Attending: Nurse Practitioner | Admitting: Nurse Practitioner

## 2023-06-03 ENCOUNTER — Other Ambulatory Visit: Payer: Self-pay | Admitting: *Deleted

## 2023-06-03 ENCOUNTER — Inpatient Hospital Stay (HOSPITAL_BASED_OUTPATIENT_CLINIC_OR_DEPARTMENT_OTHER): Payer: 59 | Admitting: Nurse Practitioner

## 2023-06-03 ENCOUNTER — Inpatient Hospital Stay: Payer: 59

## 2023-06-03 ENCOUNTER — Ambulatory Visit
Admission: RE | Admit: 2023-06-03 | Discharge: 2023-06-03 | Disposition: A | Discharge status: Home / Self Care | Payer: Managed Care, Other (non HMO) | Source: Ambulatory Visit | Attending: Nurse Practitioner

## 2023-06-03 VITALS — BP 136/99 | HR 81 | Temp 97.7°F | Wt 223.0 lb

## 2023-06-03 DIAGNOSIS — Z5112 Encounter for antineoplastic immunotherapy: Secondary | ICD-10-CM | POA: Diagnosis not present

## 2023-06-03 DIAGNOSIS — C3432 Malignant neoplasm of lower lobe, left bronchus or lung: Secondary | ICD-10-CM

## 2023-06-03 DIAGNOSIS — R110 Nausea: Secondary | ICD-10-CM

## 2023-06-03 DIAGNOSIS — R1031 Right lower quadrant pain: Secondary | ICD-10-CM

## 2023-06-03 DIAGNOSIS — J90 Pleural effusion, not elsewhere classified: Secondary | ICD-10-CM

## 2023-06-03 DIAGNOSIS — Z95828 Presence of other vascular implants and grafts: Secondary | ICD-10-CM

## 2023-06-03 LAB — CBC WITH DIFFERENTIAL (CANCER CENTER ONLY)
Abs Immature Granulocytes: 0.21 10*3/uL — ABNORMAL HIGH (ref 0.00–0.07)
Basophils Absolute: 0 10*3/uL (ref 0.0–0.1)
Basophils Relative: 1 %
Eosinophils Absolute: 0.1 10*3/uL (ref 0.0–0.5)
Eosinophils Relative: 2 %
HCT: 41.2 % (ref 39.0–52.0)
Hemoglobin: 13.8 g/dL (ref 13.0–17.0)
Immature Granulocytes: 5 %
Lymphocytes Relative: 22 %
Lymphs Abs: 0.9 10*3/uL (ref 0.7–4.0)
MCH: 33.6 pg (ref 26.0–34.0)
MCHC: 33.5 g/dL (ref 30.0–36.0)
MCV: 100.2 fL — ABNORMAL HIGH (ref 80.0–100.0)
Monocytes Absolute: 1.4 10*3/uL — ABNORMAL HIGH (ref 0.1–1.0)
Monocytes Relative: 32 %
Neutro Abs: 1.7 10*3/uL (ref 1.7–7.7)
Neutrophils Relative %: 38 %
Platelet Count: 97 10*3/uL — ABNORMAL LOW (ref 150–400)
RBC: 4.11 MIL/uL — ABNORMAL LOW (ref 4.22–5.81)
RDW: 17.4 % — ABNORMAL HIGH (ref 11.5–15.5)
WBC Count: 4.3 10*3/uL (ref 4.0–10.5)
nRBC: 0 % (ref 0.0–0.2)

## 2023-06-03 LAB — CMP (CANCER CENTER ONLY)
ALT: 48 U/L — ABNORMAL HIGH (ref 0–44)
AST: 89 U/L — ABNORMAL HIGH (ref 15–41)
Albumin: 3.2 g/dL — ABNORMAL LOW (ref 3.5–5.0)
Alkaline Phosphatase: 152 U/L — ABNORMAL HIGH (ref 38–126)
Anion gap: 7 (ref 5–15)
BUN: 7 mg/dL (ref 6–20)
CO2: 24 mmol/L (ref 22–32)
Calcium: 8.7 mg/dL — ABNORMAL LOW (ref 8.9–10.3)
Chloride: 101 mmol/L (ref 98–111)
Creatinine: 0.88 mg/dL (ref 0.61–1.24)
GFR, Estimated: >60 mL/min (ref >60)
Glucose, Bld: 113 mg/dL — ABNORMAL HIGH (ref 70–99)
Potassium: 3.3 mmol/L — ABNORMAL LOW (ref 3.5–5.1)
Sodium: 132 mmol/L — ABNORMAL LOW (ref 135–145)
Total Bilirubin: 0.7 mg/dL (ref 0.3–1.2)
Total Protein: 6.7 g/dL (ref 6.5–8.1)

## 2023-06-03 LAB — LACTATE DEHYDROGENASE: LDH: 285 U/L — ABNORMAL HIGH (ref 98–192)

## 2023-06-03 LAB — LACTIC ACID, PLASMA: Lactic Acid, Venous: 2.1 mmol/L (ref 0.5–1.9)

## 2023-06-03 MED ORDER — HEPARIN SOD (PORK) LOCK FLUSH 100 UNIT/ML IV SOLN
500.0000 [IU] | Freq: Once | INTRAVENOUS | Status: AC
  Administered 2023-06-03: 500 [IU]
  Filled 2023-06-03: qty 5

## 2023-06-03 NOTE — Progress Notes (Signed)
Symptom Management Clinic  [facility] at [facility] A Department of the [place]. [facility] [address] [place], [place] [address] [phone] (fax)  Patient Care Team: Pcp, No as PCP - General [name], RN as Oncology Nurse Navigator [name], MD as Consulting Physician (Internal Medicine)   Name of the patient: Yves Disantis  [id number]  05/20/1981   Date of visit: 06/03/23  Diagnosis- Metastatic Lung Cancer  Chief complaint/ Reason for visit- Abdominal Pain  Heme/Onc history:  Oncology History Overview Note  IMPRESSION: 1. Patchy nodular fat stranding throughout the anterior left upper quadrant peritoneal fat, nonspecific, cannot exclude peritoneal carcinomatosis. Dedicated CT abdomen/pelvis with oral and IV contrast recommended for further evaluation. 2. Dense patchy consolidation replacing much of the left lower lung lobe, appearing masslike in the superior segment left lower lobe, with associated bulging of the left major fissure and associated left lower lobe volume loss. Fine nodularity throughout both lungs with an upper lobe predominance. Asymmetric left upper lobe interlobular septal thickening. These findings are indeterminate, with differential including multilobar pneumonia, sarcoidosis or a neoplastic process. The persistence on radiographs back to 01/20/2021 despite antibiotic therapy make sarcoidosis or a neoplastic process more likely. Pulmonology consultation suggested for consideration of bronchoscopic evaluation. 3. Small dependent left pleural effusion. 4. Mild mediastinal lymphadenopathy, nonspecific. 5. Subacute healing lateral right sixth rib fracture.   DIAGNOSIS:  A. LUNG, LEFT LOWER LOBE; ENB-ASSISTED BIOPSY:  - NON-SMALL CELL CARCINOMA, FAVOR ADENOCARCINOMA.  - FOREIGN MATERIAL SUGGESTIVE OF ASPIRATION.   # EGFR MUTATED: 80 del- June 28th, 2022-  [name]. [limited]  # Asymptomatic multiple brain mets subcentimeter asymptomatic -JUNE 29th, 2023- Numerous metastatic lesions in the supratentorial brain-increase in number also conspicuity compared to January 2023.  Pre-SBRT MRI brain AUG 18th, 2023-  Eighteen small enhancing brain metastases (punctate to 11 mm); were all present on 03/28/2021-s/p SBRT SEP 2023- [[place]]; SRS/ radiation treatment for brain metastasis He finished treatment on 11-26-22.   # SEP-OCT-worsening left-sided pleural effusion-status post paracentesis; exudative; Cytology negative- ?  Progressive disease.  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  While on osimertinib- NOV 1st, 2023- PET scan- shows progressive disease in liver. Liquid Biopsy/Guardant testing-positive for EGFR mutation; otherwise Negative for any novel mutations.  Repeat liver Biopsy positive for adenocarcinoma. NEGATIVE for MET amplification.  S/p [facility] opinion at [facility]- Dr.Stinchcomb. status post 4 cycles of carbo Taxol-Atezo+Bev- JAN 25th, 2024-PET- Interval response to therapy with resolution of previously demonstrated extensive hypermetabolic activity throughout the liver. PARIL 2024- ? Partial response, but clinically worse/rising LFTs  # MAY 6th, Brain MRI-progressive multiple brain metastases  # MAY 16th, 2024- Alimta+Avastin #1  # MAY 16th, 2024-    Cancer of lower lobe of left lung (HCC)  03/23/2021 Initial Diagnosis   Cancer of lower lobe of left lung (HCC)   03/29/2021 Cancer Staging   Staging form: Lung, AJCC 8th Edition - Clinical: Stage IVB (cT3, cN3, pM1c) - Signed by [name], MD on 03/29/2021   03/30/2021 - 03/30/2021 Chemotherapy   Patient is on Treatment Plan : LUNG NSCLC Pemetrexed (Alimta) / Carboplatin q21d x 1 cycles     08/17/2022 - 01/15/2023 Chemotherapy   Patient is on Treatment Plan : LUNG Atezolizumab + Bevacizumab +  Carboplatin + Paclitaxel q21d Induction x 4 cycles / Atezolizumab + Bevacizumab q21d Maintenance  02/28/2023 -  Chemotherapy   Patient is on Treatment Plan : LUNG NON-SMALL CELL Bevacizumab + Pemetrexed q21d       Interval history- Patient is 42 year old male diagnosed with metastatic lung cancer, currently on bevacizumab-pemetrexed chemotherapy who presents to clinic for complaints of abdominal pain. He describes right lower quadrant abdominal pain. Describes as pulling pain. Nothing seems to bring it on or relieve it. Symptoms don't seem to be associated with constipation, diarrhea, or bowel movements. Pain is the same as it was previously, no worsening or occurring more frequently.   ECOG FS:2 - Symptomatic, <50% confined to bed  Review of systems- Review of Systems  Constitutional:  Negative for chills, fever, malaise/fatigue and weight loss.  HENT:  Negative for hearing loss, nosebleeds, sore throat and tinnitus.   Eyes:  Negative for blurred vision and double vision.  Respiratory:  Negative for cough, hemoptysis, shortness of breath and wheezing.   Cardiovascular:  Negative for chest pain, palpitations and leg swelling.  Gastrointestinal:  Positive for abdominal pain. Negative for blood in stool, constipation, diarrhea, melena, nausea and vomiting.  Genitourinary:  Negative for dysuria and urgency.  Musculoskeletal:  Negative for back pain, falls, joint pain and myalgias.  Skin:  Negative for itching and rash.  Neurological:  Negative for dizziness, tingling, sensory change, loss of consciousness, weakness and headaches.  Endo/Heme/Allergies:  Negative for environmental allergies. Does not bruise/bleed easily.  Psychiatric/Behavioral:  Negative for depression. The patient is not nervous/anxious and does not have insomnia.       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    Past Surgical History:  Procedure Laterality Date   FRACTURE SURGERY     broke femur when he was 6 years   PORTA CATH INSERTION N/A 03/28/2021   Procedure: PORTA CATH INSERTION;  Surgeon: Renford Dills, MD;  Location: [facility] INVASIVE CV LAB;  Service: Cardiovascular;  Laterality: N/A;   VIDEO BRONCHOSCOPY WITH ENDOBRONCHIAL NAVIGATION N/A 03/15/2021   Procedure: VIDEO BRONCHOSCOPY WITH ENDOBRONCHIAL NAVIGATION;  Surgeon: Vida Rigger, MD;  Location: [facility] ORS;  Service: Thoracic;  Laterality: N/A;   VIDEO BRONCHOSCOPY WITH ENDOBRONCHIAL ULTRASOUND N/A 03/15/2021   Procedure: VIDEO BRONCHOSCOPY WITH ENDOBRONCHIAL ULTRASOUND;  Surgeon: Vida Rigger, MD;  Location: [facility] ORS;  Service: Thoracic;  Laterality: N/A;    Social History   Socioeconomic History   Marital status: Married    Spouse name: Darl Pikes   Number of children: 2   Years of education: Not on file   Highest education level: Not on file  Occupational History   Not on file  Tobacco Use   Smoking status: Never   Smokeless tobacco: Former    Types: Snuff, Chew  Vaping Use   Vaping status: Never Used  Substance and Sexual Activity   Alcohol use: Never   Drug use: Never   Sexual activity: Yes  Other Topics Concern   Not on file  Social History Narrative   [place] in [place]; with wife; 2 daughters[12 and 22]; never smoked; rare alcohol. 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 Dog and cat.   Social Determinants of Health   Financial Resource Strain: Low Risk  (05/10/2022)   Overall Financial Resource Strain (CARDIA)    Difficulty of Paying Living  Expenses: Not hard at all  Food Insecurity: No Food Insecurity (05/10/2022)   Hunger Vital Sign    Worried About Running Out of Food in the Last Year: Never true    Ran Out of Food in the Last Year: Never true  Transportation Needs: No Transportation Needs (03/16/2022)   PRAPARE - Administrator, Civil Service  (Medical): No    Lack of Transportation (Non-Medical): No  Physical Activity: Not on file  Stress: Not on file  Social Connections: Socially Integrated (05/10/2022)   Social Connection and Isolation Panel [NHANES]    Frequency of Communication with Friends and Family: More than three times a week    Frequency of Social Gatherings with Friends and Family: More than three times a week    Attends Religious Services: More than 4 times per year    Active Member of [employer] or Organizations: Yes    Attends Engineer, structural: More than 4 times per year    Marital Status: Married  Catering manager Violence: Not on file    Family History  Problem Relation Age of Onset   High Cholesterol Mother    Hypertension Father    Lung cancer Father    Skin cancer Father      Current Outpatient Medications:    apixaban (ELIQUIS) 2.5 MG TABS tablet, Take 1 tablet (2.5 mg total) by mouth 2 (two) times daily., Disp: 60 tablet, Rfl: 4   calcium carbonate (TUMS EX) 750 MG chewable tablet, Chew 1 tablet by mouth daily., Disp: , Rfl:    ciprofloxacin (CIPRO) 500 MG tablet, Take 1 tablet (500 mg total) by mouth 2 (two) times daily., Disp: 14 tablet, Rfl: 0   ergocalciferol (VITAMIN D2) 1.25 MG (50000 UT) capsule, Take 1 capsule (50,000 Units total) by mouth once a week., Disp: 12 capsule, Rfl: 1   folic acid (FOLVITE) 1 MG tablet, TAKE 1 TABLET BY MOUTH EVERY DAY, Disp: 90 tablet, Rfl: 1   lidocaine-prilocaine (EMLA) cream, Apply 1 Application topically as needed., Disp: 30 g, Rfl: 0   metoprolol succinate (TOPROL-XL) 50 MG 24 hr tablet, Take 50 mg by mouth daily. Take with or immediately following a meal., Disp: , Rfl:    metroNIDAZOLE (FLAGYL) 500 MG tablet, Take 1 tablet (500 mg total) by mouth 3 (three) times daily., Disp: 15 tablet, Rfl: 0   sertraline (ZOLOFT) 50 MG tablet, Take 1 tablet (50 mg total) by mouth daily., Disp: 30 tablet, Rfl: 1   traMADol (ULTRAM) 50 MG tablet, Take 1 tablet (50 mg  total) by mouth every 12 (twelve) hours as needed., Disp: 30 tablet, Rfl: 0   albuterol (VENTOLIN HFA) 108 (90 Base) MCG/ACT inhaler, Inhale 2 puffs into the lungs every 6 (six) hours as needed for wheezing or shortness of breath. (Patient not taking: Reported on 06/03/2023), Disp: 8 g, Rfl: 2 No current facility-administered medications for this visit.  Facility-Administered Medications Ordered in Other Visits:    heparin lock flush 100 UNIT/ML injection, , , ,    heparin lock flush 100 UNIT/ML injection, , , ,    sodium chloride flush (NS) 0.9 % injection 10 mL, 10 mL, Intravenous, PRN, [name], MD, 10 mL at 07/28/21 0852  Physical exam:  Vitals:   06/03/23 1319 06/03/23 1322  BP: (!) 145/105 (!) 136/99  Pulse: 81   Temp: 97.7 F (36.5 C)   TempSrc: Tympanic   SpO2: 98%   Weight: 223 lb (101.2 kg)  Physical Exam Constitutional:      General: He is not in acute distress.    Appearance: He is well-developed.  HENT:     Head: Normocephalic and atraumatic.     Nose: Nose normal.     Mouth/Throat:     Pharynx: No oropharyngeal exudate.  Eyes:     General: No scleral icterus.    Conjunctiva/sclera: Conjunctivae normal.     Pupils: Pupils are equal, round, and reactive to light.  Cardiovascular:     Rate and Rhythm: Normal rate and regular rhythm.  Pulmonary:     Effort: Pulmonary effort is normal.     Breath sounds: No wheezing.  Abdominal:     General: Bowel sounds are normal. There is no distension.     Palpations: Abdomen is soft. There is no mass.     Tenderness: There is abdominal tenderness (intermittent mild tenderness to deep palpation of RLQ radiating to mid pelvis and RUQ.). There is no rebound.     Hernia: No hernia is present.  Musculoskeletal:     Comments: Ambulates w/o aids. Pain exacerbated by laying flat.   Lymphadenopathy:     Lower Body: No right inguinal adenopathy. No left inguinal adenopathy.  Skin:    General: Skin is warm and dry.      Coloration: Skin is not pale.  Neurological:     Mental Status: He is alert and oriented to person, place, and time.  Psychiatric:        Behavior: Behavior normal.         Latest Ref Rng & Units 05/30/2023    1:05 PM  CMP  Glucose 70 - 99 mg/dL 213   BUN 6 - 20 mg/dL 9   Creatinine 0.86 - 5.78 mg/dL 4.69   Sodium 629 - 528 mmol/L 133   Potassium 3.5 - 5.1 mmol/L 3.5   Chloride 98 - 111 mmol/L 100   CO2 22 - 32 mmol/L 26   Calcium 8.9 - 10.3 mg/dL 8.5   Total Protein 6.5 - 8.1 g/dL 6.5   Total Bilirubin 0.3 - 1.2 mg/dL 1.0   Alkaline Phos 38 - 126 U/L 153   AST 15 - 41 U/L 65   ALT 0 - 44 U/L 48       Latest Ref Rng & Units 05/30/2023    1:05 PM  CBC  WBC 4.0 - 10.5 K/uL 2.7   Hemoglobin 13.0 - 17.0 g/dL 41.3   Hematocrit 24.4 - 52.0 % 35.9   Platelets 150 - 400 K/uL 75     No images are attached to the encounter.  US THORACENTESIS ASP PLEURAL SPACE W/IMG GUIDE  Result Date: 05/28/2023 INDICATION: Patient with a history of left lung cancer with recurrent pleural effusion. Conventional radiology asked to perform a therapeutic thoracentesis. EXAM: ULTRASOUND GUIDED THORACENTESIS MEDICATIONS: 1% lidocaine 10 mL COMPLICATIONS: None immediate. PROCEDURE: An ultrasound guided thoracentesis was thoroughly discussed with the patient and questions answered. The benefits, risks, alternatives and complications were also discussed. The patient understands and wishes to proceed with the procedure. Written consent was obtained. Ultrasound was performed to localize and mark an adequate pocket of fluid in the left chest. The area was then prepped and draped in the normal sterile fashion. 1% Lidocaine was used for local anesthesia. Under ultrasound guidance a 6 Fr Safe-T-Centesis catheter was introduced. Thoracentesis was performed. The catheter was removed and a dressing applied. FINDINGS: A total of approximately 350 mL of red/brown pleural fluid was removed. IMPRESSION:  Successful ultrasound  guided left thoracentesis yielding 350 mL of pleural fluid. Procedure performed by Alwyn Ren NP Electronically Signed   By: Olive Bass M.D.   On: 05/28/2023 16:16   DG Chest 1 View  Result Date: 05/28/2023 CLINICAL DATA:  Status post thoracentesis EXAM: CHEST  1 VIEW COMPARISON:  07/26/2022 FINDINGS: Cardiomediastinal silhouette and pulmonary vasculature are within normal limits. Mild interval decrease of left pleural effusion. There is no pneumothorax. Right chest port is unchanged in position. IMPRESSION: No pneumothorax status post left thoracentesis. Electronically Signed   By: Acquanetta Belling M.D.   On: 05/28/2023 16:09   CT ABDOMEN PELVIS W CONTRAST  Result Date: 05/27/2023 CLINICAL DATA:  Sudden onset right lower quadrant pain last week. History of lung cancer EXAM: CT ABDOMEN AND PELVIS WITH CONTRAST TECHNIQUE: Multidetector CT imaging of the abdomen and pelvis was performed using the standard protocol following bolus administration of intravenous contrast. RADIATION DOSE REDUCTION: This exam was performed according to the departmental dose-optimization program which includes automated exposure control, adjustment of the mA and/or kV according to patient size and/or use of iterative reconstruction technique. CONTRAST:  OMNIPAQUE IOHEXOL 300 MG/ML  SOLN COMPARISON:  PET-CT 04/22/2023 MRI 02/03/2023.  Older exams as well FINDINGS: Lower chest: Once again there is a large loculated right-sided pleural effusion with pleural thickening and adjacent lung opacity. Some volume loss as well. Interstitial septal thickening in the right lung base. No right-sided effusion. Hepatobiliary: Slightly nodular contours of the liver. Patent portal vein. There are some geographic areas of low-density seen along the superior margin of the liver towards the dome and again along segment 4/5. There are some areas of capsular retraction as well. The liver contours are similar to the previous examination. The  subtle areas of lesion on the previous examination are simply not as well defined on this standard postcontrast CT without dynamic. Please correlate for known liver metastases gallbladder is mildly distended. Main and right portal veins are patent. There is truncated appearance of the left portal vein. Partial thrombus. Pancreas: Unremarkable. No pancreatic ductal dilatation or surrounding inflammatory changes. Spleen: Cephalocaudal length of 14.7 cm, enlarged. Preserved enhancement. Adrenals/Urinary Tract: Adrenal glands are preserved. No enhancing renal mass or collecting system dilatation. The ureters have normal course and caliber extending down to the bladder. Preserved contours of the urinary bladder Stomach/Bowel: The bowel on this non oral contrast examination is nondilated. This includes the stomach and small bowel. Large bowel is nondilated but there is some areas of wall thickening along left side of the colon. There are some scattered mesenteric stranding. Please correlate for any clinical evidence of colitis. Normal appendix in the right lower quadrant. Vascular/Lymphatic: No significant vascular findings are present. No enlarged abdominal or pelvic lymph nodes. Few varices. Reproductive: Prostate is unremarkable. Other: Mesenteric stranding identified.  No ascites or free air Musculoskeletal: Curvature and degenerative changes along the spine and pelvis. There is partial sacralization of the right side of L5. IMPRESSION: Nodular liver with fatty infiltration and splenomegaly. There are once again are some geographic areas of low-density in the liver with some capsular retraction. Please correlate for known liver metastases. These are less well defined on this standard postcontrast CT. No bowel obstruction or free air. There is some subtle wall thickening along left side of the colon. A subtle colitis is not excluded. Please correlate with symptoms. Normal appendix. Persistent large loculated left  pleural effusion with pleural thickening. The main and right portal veins are patent. The left  portal vein appears somewhat small, atretic. Partial thrombus is possible Electronically Signed   By: Karen Kays M.D.   On: 05/27/2023 12:48    Assessment and plan- Patient is a 42 y.o. male   Colitis- RLQ pain and intermittent vomiting on 05/27/23. CT showed possible thickening of left side of colon possibly consistent with colitis. Appendix normal. Started empiric cipro and flagyl. No improvement. Labs largely unrevealing. Abdominal xray w/o evidence of perforation in setting of recent avastin. Reviewed symptoms with Dr Donneta Romberg who advises possibly inflammatory pain. Recommends prednisone 40 mg daily x 7 days.  Metastatic Lung Cancer- s/p Cycle 5 of alimta-Mvasi on 05/21/23 with Dr. Donneta Romberg.   Follow up with Dr Donneta Romberg as scheduled. If symptoms do not improve or worsen, notify clinic for reevaluation.    Visit Diagnosis 1. RLQ abdominal pain   2. Cancer of lower lobe of left lung [facility])     Patient expressed understanding and was in agreement with this plan. He also understands that He can call clinic at any time with any questions, concerns, or complaints.   Thank you for allowing me to participate in the care of this very pleasant patient.   Consuello Masse, DNP, AGNP-C, AOCNP [facility] [phone]

## 2023-06-03 NOTE — Progress Notes (Signed)
Patient says that he is fatigued and is having some lower right abdomin pain. Nausea along with some vomiting. Diarrhea and some constipation.

## 2023-06-03 NOTE — Telephone Encounter (Signed)
Pt contacted by [facility] RN- apt for smc at 100pm for port labs- smc/ possible iv fluids

## 2023-06-04 ENCOUNTER — Encounter: Payer: Self-pay | Admitting: Nurse Practitioner

## 2023-06-04 ENCOUNTER — Encounter: Payer: Self-pay | Admitting: Internal Medicine

## 2023-06-04 ENCOUNTER — Telehealth: Payer: Self-pay | Admitting: *Deleted

## 2023-06-04 MED ORDER — PREDNISONE 20 MG PO TABS: 40.0000 mg | ORAL_TABLET | Freq: Every day | ORAL | 0 refills | Status: AC

## 2023-06-04 NOTE — Telephone Encounter (Signed)
Per Lauren NP, no acute findings on recent abd xray. Dr. B recommends prednisone 40mg  daily and to follow up with him in 1 week. Pt's wife made aware of results and recommendations. Advised to keep appts as scheduled.

## 2023-06-05 ENCOUNTER — Inpatient Hospital Stay: Payer: 59

## 2023-06-05 ENCOUNTER — Encounter: Payer: Self-pay | Admitting: Nurse Practitioner

## 2023-06-08 ENCOUNTER — Other Ambulatory Visit: Payer: Self-pay | Admitting: Internal Medicine

## 2023-06-10 ENCOUNTER — Encounter: Payer: Self-pay | Admitting: Nurse Practitioner

## 2023-06-10 ENCOUNTER — Encounter: Payer: Self-pay | Admitting: Internal Medicine

## 2023-06-11 ENCOUNTER — Inpatient Hospital Stay: Payer: 59

## 2023-06-11 ENCOUNTER — Inpatient Hospital Stay (HOSPITAL_BASED_OUTPATIENT_CLINIC_OR_DEPARTMENT_OTHER): Payer: 59 | Admitting: Internal Medicine

## 2023-06-11 ENCOUNTER — Encounter: Payer: Self-pay | Admitting: Internal Medicine

## 2023-06-11 VITALS — BP 125/85 | HR 76 | Temp 96.6°F | Ht 71.0 in | Wt 226.4 lb

## 2023-06-11 DIAGNOSIS — C3432 Malignant neoplasm of lower lobe, left bronchus or lung: Secondary | ICD-10-CM | POA: Diagnosis not present

## 2023-06-11 DIAGNOSIS — Z5112 Encounter for antineoplastic immunotherapy: Secondary | ICD-10-CM | POA: Diagnosis not present

## 2023-06-11 LAB — CMP (CANCER CENTER ONLY)
ALT: 61 U/L — ABNORMAL HIGH (ref 0–44)
AST: 95 U/L — ABNORMAL HIGH (ref 15–41)
Albumin: 3.8 g/dL (ref 3.5–5.0)
Alkaline Phosphatase: 155 U/L — ABNORMAL HIGH (ref 38–126)
Anion gap: 8 (ref 5–15)
BUN: 5 mg/dL — ABNORMAL LOW (ref 6–20)
CO2: 24 mmol/L (ref 22–32)
Calcium: 9 mg/dL (ref 8.9–10.3)
Chloride: 105 mmol/L (ref 98–111)
Creatinine: 0.82 mg/dL (ref 0.61–1.24)
GFR, Estimated: >60 mL/min (ref >60)
Glucose, Bld: 96 mg/dL (ref 70–99)
Potassium: 3.6 mmol/L (ref 3.5–5.1)
Sodium: 137 mmol/L (ref 135–145)
Total Bilirubin: 1.2 mg/dL (ref 0.3–1.2)
Total Protein: 6.9 g/dL (ref 6.5–8.1)

## 2023-06-11 LAB — CBC WITH DIFFERENTIAL (CANCER CENTER ONLY)
Abs Immature Granulocytes: 0.08 10*3/uL — ABNORMAL HIGH (ref 0.00–0.07)
Basophils Absolute: 0.1 10*3/uL (ref 0.0–0.1)
Basophils Relative: 1 %
Eosinophils Absolute: 0.1 10*3/uL (ref 0.0–0.5)
Eosinophils Relative: 2 %
HCT: 42 % (ref 39.0–52.0)
Hemoglobin: 14 g/dL (ref 13.0–17.0)
Immature Granulocytes: 1 %
Lymphocytes Relative: 21 %
Lymphs Abs: 1.3 10*3/uL (ref 0.7–4.0)
MCH: 33.7 pg (ref 26.0–34.0)
MCHC: 33.3 g/dL (ref 30.0–36.0)
MCV: 101 fL — ABNORMAL HIGH (ref 80.0–100.0)
Monocytes Absolute: 1.1 10*3/uL — ABNORMAL HIGH (ref 0.1–1.0)
Monocytes Relative: 18 %
Neutro Abs: 3.6 10*3/uL (ref 1.7–7.7)
Neutrophils Relative %: 57 %
Platelet Count: 174 10*3/uL (ref 150–400)
RBC: 4.16 MIL/uL — ABNORMAL LOW (ref 4.22–5.81)
RDW: 16.4 % — ABNORMAL HIGH (ref 11.5–15.5)
WBC Count: 6.3 10*3/uL (ref 4.0–10.5)
nRBC: 0 % (ref 0.0–0.2)

## 2023-06-11 LAB — PROTEIN, URINE, RANDOM: Total Protein, Urine: 11 mg/dL

## 2023-06-11 MED ORDER — SODIUM CHLORIDE 0.9 % IV SOLN
Freq: Once | INTRAVENOUS | Status: AC
  Filled 2023-06-11: qty 250

## 2023-06-11 MED ORDER — SODIUM CHLORIDE 0.9 % IV SOLN
500.0000 mg/m2 | Freq: Once | INTRAVENOUS | Status: AC
  Administered 2023-06-11: 1100 mg via INTRAVENOUS
  Filled 2023-06-11: qty 40

## 2023-06-11 MED ORDER — SODIUM CHLORIDE 0.9 % IV SOLN
15.0000 mg/kg | Freq: Once | INTRAVENOUS | Status: AC
  Administered 2023-06-11: 1600 mg via INTRAVENOUS
  Filled 2023-06-11: qty 64

## 2023-06-11 MED ORDER — PROCHLORPERAZINE MALEATE 10 MG PO TABS
10.0000 mg | ORAL_TABLET | Freq: Once | ORAL | Status: AC
  Administered 2023-06-11: 10 mg via ORAL
  Filled 2023-06-11: qty 1

## 2023-06-11 MED ORDER — CYANOCOBALAMIN 1000 MCG/ML IJ SOLN
1000.0000 ug | Freq: Once | INTRAMUSCULAR | Status: AC
  Administered 2023-06-11: 1000 ug via INTRAMUSCULAR
  Filled 2023-06-11: qty 1

## 2023-06-11 MED ORDER — HEPARIN SOD (PORK) LOCK FLUSH 100 UNIT/ML IV SOLN
500.0000 [IU] | Freq: Once | INTRAVENOUS | Status: AC | PRN
  Administered 2023-06-11: 500 [IU]
  Filled 2023-06-11: qty 5

## 2023-06-11 MED ORDER — SODIUM CHLORIDE 0.9% FLUSH
10.0000 mL | INTRAVENOUS | Status: DC | PRN
  Administered 2023-06-11: 10 mL
  Filled 2023-06-11: qty 10

## 2023-06-11 NOTE — Patient Instructions (Signed)
[place] [facility] AT [facility]  Discharge Instructions: Thank you for choosing [facility] to provide your oncology and hematology care.  If you have a lab appointment with the Cancer Center, please go directly to the Cancer Center and check in at the registration area.  Wear comfortable clothing and clothing appropriate for easy access to any Portacath or PICC line.   We strive to give you quality time with your provider. You may need to reschedule your appointment if you arrive late (15 or more minutes).  Arriving late affects you and other patients whose appointments are after yours.  Also, if you miss three or more appointments without notifying the office, you may be dismissed from the clinic at the provider's discretion.      For prescription refill requests, have your pharmacy contact our office and allow 72 hours for refills to be completed.    Today you received the following chemotherapy and/or immunotherapy agents- bevacizumab, Alimta      To help prevent nausea and vomiting after your treatment, we encourage you to take your nausea medication as directed.  BELOW ARE SYMPTOMS THAT SHOULD BE REPORTED IMMEDIATELY: *FEVER GREATER THAN 100.4 F (38 C) OR HIGHER *CHILLS OR SWEATING *NAUSEA AND VOMITING THAT IS NOT CONTROLLED WITH YOUR NAUSEA MEDICATION *UNUSUAL SHORTNESS OF BREATH *UNUSUAL BRUISING OR BLEEDING *URINARY PROBLEMS (pain or burning when urinating, or frequent urination) *BOWEL PROBLEMS (unusual diarrhea, constipation, pain near the anus) TENDERNESS IN MOUTH AND THROAT WITH OR WITHOUT PRESENCE OF ULCERS (sore throat, sores in mouth, or a toothache) UNUSUAL RASH, SWELLING OR PAIN  UNUSUAL VAGINAL DISCHARGE OR ITCHING   Items with * indicate a potential emergency and should be followed up as soon as possible or go to the Emergency Department if any problems should occur.  Please show the CHEMOTHERAPY ALERT CARD or IMMUNOTHERAPY ALERT CARD at  check-in to the Emergency Department and triage nurse.  Should you have questions after your visit or need to cancel or reschedule your appointment, please contact [place] [facility] AT [facility]  [phone] and follow the prompts.  Office hours are 8:00 a.m. to 4:30 p.m. Monday - Friday. Please note that voicemails left after 4:00 p.m. may not be returned until the following business day.  We are closed weekends and major holidays. You have access to a nurse at all times for urgent questions. Please call the main number to the clinic [phone] and follow the prompts.  For any non-urgent questions, you may also contact your provider using MyChart. We now offer e-Visits for anyone 15 and older to request care online for non-urgent symptoms. For details visit [email or web address].   Also download the MyChart app! Go to the app store, search "MyChart", open the app, select [place], and log in with your MyChart username and password.

## 2023-06-11 NOTE — Progress Notes (Signed)
[facility] CONSULT NOTE  Patient Care Team: Pcp, No as PCP - General [name], RN as Oncology Nurse Navigator [name], MD as Consulting Physician (Internal Medicine)  CHIEF COMPLAINTS/PURPOSE OF CONSULTATION: Lung cancer  #  Oncology History Overview Note  IMPRESSION: 1. Patchy nodular fat stranding throughout the anterior left upper quadrant peritoneal fat, nonspecific, cannot exclude peritoneal carcinomatosis. Dedicated CT abdomen/pelvis with oral and IV contrast recommended for further evaluation. 2. Dense patchy consolidation replacing much of the left lower lung lobe, appearing masslike in the superior segment left lower lobe, with associated bulging of the left major fissure and associated left lower lobe volume loss. Fine nodularity throughout both lungs with an upper lobe predominance. Asymmetric left upper lobe interlobular septal thickening. These findings are indeterminate, with differential including multilobar pneumonia, sarcoidosis or a neoplastic process. The persistence on radiographs back to 01/20/2021 despite antibiotic therapy make sarcoidosis or a neoplastic process more likely. Pulmonology consultation suggested for consideration of bronchoscopic evaluation. 3. Small dependent left pleural effusion. 4. Mild mediastinal lymphadenopathy, nonspecific. 5. Subacute healing lateral right sixth rib fracture.   DIAGNOSIS:  A. LUNG, LEFT LOWER LOBE; ENB-ASSISTED BIOPSY:  - NON-SMALL CELL CARCINOMA, FAVOR ADENOCARCINOMA.  - FOREIGN MATERIAL SUGGESTIVE OF ASPIRATION.   # EGFR MUTATED: 20 del- June 28th, 2022- [name]. [limited]  # Asymptomatic multiple brain mets subcentimeter asymptomatic -JUNE 29th, 2023- Numerous metastatic lesions in the supratentorial brain-increase in number also conspicuity compared to January 2023.  Pre-SBRT MRI brain AUG 18th, 2023-  Eighteen small enhancing brain metastases (punctate to 11 mm); were all  present on 03/28/2021-s/p SBRT SEP 2023- [[place]]; SRS/ radiation treatment for brain metastasis He finished treatment on 11-26-22.   # SEP-OCT-worsening left-sided pleural effusion-status post paracentesis; exudative; Cytology negative- ?  Progressive disease.  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  While on osimertinib- NOV 1st, 2023- PET scan- shows progressive disease in liver. Liquid Biopsy/Guardant testing-positive for EGFR mutation; otherwise Negative for any novel mutations.  Repeat liver Biopsy positive for adenocarcinoma. NEGATIVE for MET amplification.  S/p second opinion at [facility]- Dr.Stinchcomb. status post 4 cycles of carbo Taxol-Atezo+Bev- JAN 25th, 2024-PET- Interval response to therapy with resolution of previously demonstrated extensive hypermetabolic activity throughout the liver. PARIL 2024- ? Partial response, but clinically worse/rising LFTs  # MAY 6th, Brain MRI-progressive multiple brain metastases  # MAY 16th, 2024- Alimta+Avastin #1  # MAY 16th, 2024-    Cancer of lower lobe of left lung (HCC)  03/23/2021 Initial Diagnosis   Cancer of lower lobe of left lung (HCC)   03/29/2021 Cancer Staging   Staging form: Lung, AJCC 8th Edition - Clinical: Stage IVB (cT3, cN3, pM1c) - Signed by [name], MD on 03/29/2021   03/30/2021 - 03/30/2021 Chemotherapy   Patient is on Treatment Plan : LUNG NSCLC Pemetrexed (Alimta) / Carboplatin q21d x 1 cycles     08/17/2022 - 01/15/2023 Chemotherapy   Patient is on Treatment Plan : LUNG Atezolizumab + Bevacizumab + Carboplatin + Paclitaxel q21d Induction x 4 cycles / Atezolizumab + Bevacizumab q21d Maintenance     02/28/2023 -  Chemotherapy   Patient is on Treatment Plan : LUNG NON-SMALL CELL Bevacizumab + Pemetrexed q21d      HISTORY OF PRESENTING ILLNESS: Ambulating independently.  Accompanied by his dad.   [name] 42 y.o.   male patient with stage IV lung cancer adenocarcinoma-brain mets [synchronous]  bone mets EGFR mutated -currently on Alimta- Bev is here for follow-up. Patient currently on Alimta+ M-vasi s/p cycle #5- appx 3 weeks ago.   Patient seen in [facility] twice in the interim- s/p antibiotics- after CT scan possible colitis. Patient di not use any steroids. No worsening headaches.   Currently on  Eliquis 2.5mg  bid due to bleeding from his nose.  Shortness of breath coughing improved. Denies any abdominal pain nausea vomiting.  No worsening pain.   No worsening skin rash.   Review of Systems  Constitutional:  Positive for malaise/fatigue. Negative for chills, diaphoresis and fever.  HENT:  Negative for nosebleeds and sore throat.   Eyes:  Negative for double vision.  Respiratory:  Positive for cough. Negative for wheezing.   Cardiovascular:  Negative for chest pain, palpitations, orthopnea and leg swelling.  Gastrointestinal:  Positive for heartburn and nausea. Negative for abdominal pain, blood in stool, diarrhea and melena.  Genitourinary:  Negative for dysuria, frequency and urgency.  Musculoskeletal:  Negative for back pain and joint pain.  Skin:  Negative for itching.  Neurological:  Negative for tingling and focal weakness.  Endo/Heme/Allergies:  Does not bruise/bleed easily.  Psychiatric/Behavioral:  Negative for depression. The patient is not nervous/anxious and does not have insomnia.      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    SURGICAL HISTORY: Past Surgical History:  Procedure Laterality Date   FRACTURE SURGERY     broke femur when he was 6 years   PORTA CATH INSERTION N/A 03/28/2021   Procedure: PORTA CATH INSERTION;  Surgeon: Renford Dills, MD;  Location: [facility] INVASIVE CV LAB;  Service: Cardiovascular;  Laterality: N/A;   VIDEO BRONCHOSCOPY WITH  ENDOBRONCHIAL NAVIGATION N/A 03/15/2021   Procedure: VIDEO BRONCHOSCOPY WITH ENDOBRONCHIAL NAVIGATION;  Surgeon: Vida Rigger, MD;  Location: [facility] ORS;  Service: Thoracic;  Laterality: N/A;   VIDEO BRONCHOSCOPY WITH ENDOBRONCHIAL ULTRASOUND N/A 03/15/2021   Procedure: VIDEO BRONCHOSCOPY WITH ENDOBRONCHIAL ULTRASOUND;  Surgeon: Vida Rigger, MD;  Location: [facility] ORS;  Service: Thoracic;  Laterality: N/A;    SOCIAL HISTORY: Social History   Socioeconomic History   Marital status: Married    Spouse name: Darl Pikes   Number of children: 2   Years of education: Not on file   Highest education level: Not on file  Occupational History   Not on file  Tobacco Use   Smoking status: Never   Smokeless tobacco: Former    Types: Snuff, Chew  Vaping Use   Vaping status: Never Used  Substance and Sexual Activity   Alcohol use: Never   Drug use: Never   Sexual activity: Yes  Other Topics Concern   Not on file  Social History Narrative   [place] in [place]; with wife; 2 daughters[12 and 22]; never smoked; rare alcohol. 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 Dog and cat.   Social Determinants of Health   Financial Resource Strain: Low Risk  (05/10/2022)   Overall Financial Resource Strain (CARDIA)    Difficulty of Paying Living Expenses: Not hard at all  Food Insecurity: No Food Insecurity (05/10/2022)   Hunger Vital Sign    Worried About Running Out of Food in the Last Year: Never true    Ran Out of Food in the Last Year: Never true  Transportation Needs: No Transportation Needs (03/16/2022)  PRAPARE - Administrator, Civil Service (Medical): No    Lack of Transportation (Non-Medical): No  Physical Activity: Not on file  Stress: Not on file  Social Connections: Socially Integrated (05/10/2022)   Social Connection and Isolation Panel [NHANES]    Frequency of Communication with Friends and Family: More than three times a week    Frequency of Social Gatherings with Friends and Family: More than three  times a week    Attends Religious Services: More than 4 times per year    Active Member of [employer] or Organizations: Yes    Attends Engineer, structural: More than 4 times per year    Marital Status: Married  Catering manager Violence: Not on file    FAMILY HISTORY: Family History  Problem Relation Age of Onset   High Cholesterol Mother    Hypertension Father    Lung cancer Father    Skin cancer Father     ALLERGIES:  is allergic to codeine.  MEDICATIONS:  Current Outpatient Medications  Medication Sig Dispense Refill   apixaban (ELIQUIS) 2.5 MG TABS tablet Take 1 tablet (2.5 mg total) by mouth 2 (two) times daily. 60 tablet 4   calcium carbonate (TUMS EX) 750 MG chewable tablet Chew 1 tablet by mouth daily.     ciprofloxacin (CIPRO) 500 MG tablet Take 1 tablet (500 mg total) by mouth 2 (two) times daily. 14 tablet 0   folic acid (FOLVITE) 1 MG tablet TAKE 1 TABLET BY MOUTH EVERY DAY 90 tablet 1   lidocaine-prilocaine (EMLA) cream Apply 1 Application topically as needed. 30 g 0   metoprolol succinate (TOPROL-XL) 50 MG 24 hr tablet Take 50 mg by mouth daily. Take with or immediately following a meal.     metroNIDAZOLE (FLAGYL) 500 MG tablet Take 1 tablet (500 mg total) by mouth 3 (three) times daily. 15 tablet 0   predniSONE (DELTASONE) 20 MG tablet Take 2 tablets (40 mg total) by mouth daily with breakfast for 7 days. 14 tablet 0   sertraline (ZOLOFT) 50 MG tablet Take 1 tablet (50 mg total) by mouth daily. 30 tablet 1   traMADol (ULTRAM) 50 MG tablet Take 1 tablet (50 mg total) by mouth every 12 (twelve) hours as needed. 30 tablet 0   Vitamin D, Ergocalciferol, (DRISDOL) 1.25 MG (50000 UNIT) CAPS capsule TAKE 1 CAPSULE BY MOUTH ONE TIME PER WEEK 12 capsule 1   albuterol (VENTOLIN HFA) 108 (90 Base) MCG/ACT inhaler Inhale 2 puffs into the lungs every 6 (six) hours as needed for wheezing or shortness of breath. (Patient not taking: Reported on 06/03/2023) 8 g 2   No current  facility-administered medications for this visit.   Facility-Administered Medications Ordered in Other Visits  Medication Dose Route Frequency Provider Last Rate Last Admin   bevacizumab-awwb (MVASI) 1,600 mg in sodium chloride 0.9 % 100 mL chemo infusion  15 mg/kg (Treatment Plan Recorded) Intravenous Once [name], MD       cyanocobalamin (VITAMIN B12) injection 1,000 mcg  1,000 mcg Intramuscular Once Louretta Shorten R, MD       heparin lock flush 100 UNIT/ML injection            heparin lock flush 100 UNIT/ML injection            heparin lock flush 100 unit/mL  500 Units Intracatheter Once PRN [name], MD       PEMEtrexed (ALIMTA) 1,100 mg in sodium chloride 0.9 % 100  mL chemo infusion  500 mg/m2 (Treatment Plan Recorded) Intravenous Once [name], MD       prochlorperazine (COMPAZINE) tablet 10 mg  10 mg Oral Once Louretta Shorten R, MD       sodium chloride flush (NS) 0.9 % injection 10 mL  10 mL Intravenous PRN Louretta Shorten R, MD   10 mL at 07/28/21 0852   sodium chloride flush (NS) 0.9 % injection 10 mL  10 mL Intracatheter PRN [name], MD          .  PHYSICAL EXAMINATION: ECOG PERFORMANCE STATUS: 1 - Symptomatic but completely ambulatory  Vitals:   06/11/23 0851 06/11/23 0942  BP: (!) 141/102 125/85  Pulse: 75 76  Temp: (!) 96.6 F (35.9 C)   SpO2: 97%         Filed Weights   06/11/23 0851  Weight: 226 lb 6.4 oz (102.7 kg)       Left cheek papular lesion noted.  No signs of infection.  Physical Exam HENT:     Head: Normocephalic and atraumatic.     Mouth/Throat:     Pharynx: No oropharyngeal exudate.  Eyes:     Pupils: Pupils are equal, round, and reactive to light.  Cardiovascular:     Rate and Rhythm: Normal rate and regular rhythm.  Pulmonary:     Effort: No respiratory distress.     Breath sounds: No wheezing.     Comments: Decreased breath sound on the left side compared to  right. Abdominal:     General: Bowel sounds are normal. There is no distension.     Palpations: Abdomen is soft. There is no mass.     Tenderness: There is no abdominal tenderness. There is no guarding or rebound.  Musculoskeletal:        General: No tenderness. Normal range of motion.     Cervical back: Normal range of motion and neck supple.  Skin:    General: Skin is warm.  Neurological:     Mental Status: He is alert and oriented to person, place, and time.  Psychiatric:        Mood and Affect: Affect normal.    Fungal dermatitis bilateral buttocks groin/underneath abdominal pannus  LABORATORY DATA:  I have reviewed the data as listed Lab Results  Component Value Date   WBC 6.3 06/11/2023   HGB 14.0 06/11/2023   HCT 42.0 06/11/2023   MCV 101.0 (H) 06/11/2023   PLT 174 06/11/2023   Recent Labs    05/30/23 1305 06/03/23 1257 06/11/23 0857  NA 133* 132* 137  K 3.5 3.3* 3.6  CL 100 101 105  CO2 26 24 24   GLUCOSE 123* 113* 96  BUN 9 7 5*  CREATININE 0.74 0.88 0.82  CALCIUM 8.5* 8.7* 9.0  GFRNONAA >60 >60 >60  PROT 6.5 6.7 6.9  ALBUMIN 3.1* 3.2* 3.8  AST 65* 89* 95*  ALT 48* 48* 61*  ALKPHOS 153* 152* 155*  BILITOT 1.0 0.7 1.2    RADIOGRAPHIC STUDIES: I have personally reviewed the radiological images as listed and agreed with the findings in the report. DG Abd 2 Views  Result Date: 06/03/2023 CLINICAL DATA:  Abdominal pain AND vomiting- RLQ. On avastin for metastatic lung cancer. Eval for GI perforation. EXAM: ABDOMEN - 2 VIEW COMPARISON:  05/27/2023 CT abdomen/pelvis FINDINGS: No dilated small bowel loops or significant air-fluid levels. Minimal colonic stool and gas. No evidence of pneumatosis or pneumoperitoneum. At least moderate loculated left pleural  effusion with left basilar patchy lung opacity, unchanged from recent CT. No radiopaque nephrolithiasis. IMPRESSION: 1. Nonobstructive bowel gas pattern. No evidence of pneumoperitoneum. 2. At least moderate  loculated left pleural effusion with left basilar patchy lung opacity, unchanged from recent CT. Electronically Signed   By: Delbert Phenix M.D.   On: 06/03/2023 16:48   US THORACENTESIS ASP PLEURAL SPACE W/IMG GUIDE  Result Date: 05/28/2023 INDICATION: Patient with a history of left lung cancer with recurrent pleural effusion. Conventional radiology asked to perform a therapeutic thoracentesis. EXAM: ULTRASOUND GUIDED THORACENTESIS MEDICATIONS: 1% lidocaine 10 mL COMPLICATIONS: None immediate. PROCEDURE: An ultrasound guided thoracentesis was thoroughly discussed with the patient and questions answered. The benefits, risks, alternatives and complications were also discussed. The patient understands and wishes to proceed with the procedure. Written consent was obtained. Ultrasound was performed to localize and mark an adequate pocket of fluid in the left chest. The area was then prepped and draped in the normal sterile fashion. 1% Lidocaine was used for local anesthesia. Under ultrasound guidance a 6 Fr Safe-T-Centesis catheter was introduced. Thoracentesis was performed. The catheter was removed and a dressing applied. FINDINGS: A total of approximately 350 mL of red/brown pleural fluid was removed. IMPRESSION: Successful ultrasound guided left thoracentesis yielding 350 mL of pleural fluid. Procedure performed by Alwyn Ren NP Electronically Signed   By: Olive Bass M.D.   On: 05/28/2023 16:16   DG Chest 1 View  Result Date: 05/28/2023 CLINICAL DATA:  Status post thoracentesis EXAM: CHEST  1 VIEW COMPARISON:  07/26/2022 FINDINGS: Cardiomediastinal silhouette and pulmonary vasculature are within normal limits. Mild interval decrease of left pleural effusion. There is no pneumothorax. Right chest port is unchanged in position. IMPRESSION: No pneumothorax status post left thoracentesis. Electronically Signed   By: Acquanetta Belling M.D.   On: 05/28/2023 16:09   CT ABDOMEN PELVIS W CONTRAST  Result Date:  05/27/2023 CLINICAL DATA:  Sudden onset right lower quadrant pain last week. History of lung cancer EXAM: CT ABDOMEN AND PELVIS WITH CONTRAST TECHNIQUE: Multidetector CT imaging of the abdomen and pelvis was performed using the standard protocol following bolus administration of intravenous contrast. RADIATION DOSE REDUCTION: This exam was performed according to the departmental dose-optimization program which includes automated exposure control, adjustment of the mA and/or kV according to patient size and/or use of iterative reconstruction technique. CONTRAST:  OMNIPAQUE IOHEXOL 300 MG/ML  SOLN COMPARISON:  PET-CT 04/22/2023 MRI 02/03/2023.  Older exams as well FINDINGS: Lower chest: Once again there is a large loculated right-sided pleural effusion with pleural thickening and adjacent lung opacity. Some volume loss as well. Interstitial septal thickening in the right lung base. No right-sided effusion. Hepatobiliary: Slightly nodular contours of the liver. Patent portal vein. There are some geographic areas of low-density seen along the superior margin of the liver towards the dome and again along segment 4/5. There are some areas of capsular retraction as well. The liver contours are similar to the previous examination. The subtle areas of lesion on the previous examination are simply not as well defined on this standard postcontrast CT without dynamic. Please correlate for known liver metastases gallbladder is mildly distended. Main and right portal veins are patent. There is truncated appearance of the left portal vein. Partial thrombus. Pancreas: Unremarkable. No pancreatic ductal dilatation or surrounding inflammatory changes. Spleen: Cephalocaudal length of 14.7 cm, enlarged. Preserved enhancement. Adrenals/Urinary Tract: Adrenal glands are preserved. No enhancing renal mass or collecting system dilatation. The ureters have normal course and caliber  extending down to the bladder. Preserved contours of  the urinary bladder Stomach/Bowel: The bowel on this non oral contrast examination is nondilated. This includes the stomach and small bowel. Large bowel is nondilated but there is some areas of wall thickening along left side of the colon. There are some scattered mesenteric stranding. Please correlate for any clinical evidence of colitis. Normal appendix in the right lower quadrant. Vascular/Lymphatic: No significant vascular findings are present. No enlarged abdominal or pelvic lymph nodes. Few varices. Reproductive: Prostate is unremarkable. Other: Mesenteric stranding identified.  No ascites or free air Musculoskeletal: Curvature and degenerative changes along the spine and pelvis. There is partial sacralization of the right side of L5. IMPRESSION: Nodular liver with fatty infiltration and splenomegaly. There are once again are some geographic areas of low-density in the liver with some capsular retraction. Please correlate for known liver metastases. These are less well defined on this standard postcontrast CT. No bowel obstruction or free air. There is some subtle wall thickening along left side of the colon. A subtle colitis is not excluded. Please correlate with symptoms. Normal appendix. Persistent large loculated left pleural effusion with pleural thickening. The main and right portal veins are patent. The left portal vein appears somewhat small, atretic. Partial thrombus is possible Electronically Signed   By: Karen Kays M.D.   On: 05/27/2023 12:48    ASSESSMENT & PLAN:   Cancer of lower lobe of left lung (HCC) # STAGE IV- [JUNE 2022] Left lower lobe lung cancer-adenocarcinoma the lung;  EGFR- 19 del POSITIVE. s /p second opinion at [facility]- Dr.Stinchcomb. status post 4 cycles of carbo Taxol-Atezo+Bev- JAN 25th, 2024-PET- Interval response to therapy with resolution of previously demonstrated extensive hypermetabolic activity throughout the liver.  PET scan April 17th- Unchanged appearance of chronic  loculated left pleural effusion/fibrothorax with new hypermetabolic activity of the pleura and adjacent consolidations, findings may be due to pseudoprogression or progressive disease. New irregular areas of hypermetabolic activity in the liver associated with previously described metastatic liver lesions, findings may be due to pseudoprogression or progressive disease. April 21st- liver MRI-  showed positive response to therapy when compared to prior abdominal MRI in terms of regression of diffuse hepatic metastatic disease.  Much of these imaging findings may be attributable to developing areas of hepatic fibrosis at the site of treated lesions, however, given the hypermetabolism in these regions on the recent PET-CT, some degree of residual tumor is not excluded. No new lesions are identified.   However clinically concerned about progression esp in liver- Discontinued Cycle #5  -Atezo + bev-given clinical concerns of progression [extreme fatigue rising LFTs].  # currently on Alimta+ M-vasi. JULY  13th, 2024- Subpleural left upper lobe opacities, raising concern for recurrent/metastatic disease. Associated chronic left pleural effusion. Heterogeneous hypermetabolism in the peripheral liver, similar to the prior, suspicious for some degree of residual hepatic metastases. stable.   # proceed with  Alimta+ M-vasi s/p cyle #6. Labs-CBC/chemistries were reviewed with the patient. Will repeat scan in OCT 2024. Will order at next visit.   # Elevated LFTs: Normal bilirubin-question related to immunotherapy [likley] versus progressive disease.  Overall stable- monitor for now.   # Nose bleeds: multifactorial- avastin+ platelets-currently on low-dose of Eliquis given thrombocytopenia from chemotherapy.   continue Eliquis at low-dose of 2.5 mg BID stable.   # Eye burning/tearing- on Patanol eye drop BID-  stable.   #Asymptomatic multiple brain mets subcentimeter asymptomatic-  -s/p SBRT SEP 2023- [[place]].  S/p SBRT  [  [place]]- FEB 12th, 2024. July 9th, 2024-  Stable clinically. Decreased conspicuity of all enhancing lesions with some lesions described on the prior no longer visible and others less visible. No new lesions identified  S/p evaluation with neuro-oncology- stable. Discussed with Dr.Vaslow. will repeat imaging in OCT, 2024-  stable.   # bone metastases-Hypocalcemia: mild- continue calcium 1200 plus vitamin D-3 1000-over-the-counter-1 pill a day.  APRIL 2024-vit D 25-OH-28- on Ergocalciferol - stable.   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 Continue dose of Eliquis 2.5 mg BID.[given nose bleeds]- see above. Stable  #Bone metastases- CT DEc 7th, 2022 to healing process.  Dental evaluation on 9/21.  Continue calcium vitamin D intake. stable.   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   # IV access/ port flush.   [Tuesday/wed]- pref B12  on 8/27  # DISPOSITION:  # chemo today- Alimta+ M-vasi; ;b12 injection--do not wait for UA # as per IS- Follow up in 3  weeks- MD; port-labs- cbc/cmp; CEA; UA; Alimta+ M-vasi- Dr.B      All questions were answered. The patient knows to call the clinic with any problems, questions or concerns.    [name], MD 06/11/2023 10:38 AM

## 2023-06-11 NOTE — Progress Notes (Signed)
No questions or concerns today 

## 2023-06-11 NOTE — Assessment & Plan Note (Addendum)
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 s /p second opinion at [facility]- Dr.Stinchcomb. status post 4 cycles of carbo Taxol-Atezo+Bev- JAN 25th, 2024-PET- Interval response to therapy with resolution of previously demonstrated extensive hypermetabolic activity throughout the liver.  PET scan April 17th- Unchanged appearance of chronic loculated left pleural effusion/fibrothorax with new hypermetabolic activity of the pleura and adjacent consolidations, findings may be due to pseudoprogression or progressive disease. New irregular areas of hypermetabolic activity in the liver associated with previously described metastatic liver lesions, findings may be due to pseudoprogression or progressive disease. April 21st- liver MRI-  showed positive response to therapy when compared to prior abdominal MRI in terms of regression of diffuse hepatic metastatic disease.  Much of these imaging findings may be attributable to developing areas of hepatic fibrosis at the site of treated lesions, however, given the hypermetabolism in these regions on the recent PET-CT, some degree of residual tumor is not excluded. No new lesions are identified.   However clinically concerned about progression esp in liver- Discontinued Cycle #5  -Atezo + bev-given clinical concerns of progression [extreme fatigue rising LFTs].  # currently on Alimta+ M-vasi. JULY  13th, 2024- Subpleural left upper lobe opacities, raising concern for recurrent/metastatic disease. Associated chronic left pleural effusion. Heterogeneous hypermetabolism in the peripheral liver, similar to the prior, suspicious for some degree of residual hepatic metastases. stable.   # proceed with  Alimta+ M-vasi s/p cyle #6. Labs-CBC/chemistries were reviewed with the patient. Will repeat scan in OCT 2024. Will order at next visit.   # Elevated LFTs: Normal bilirubin-question related to immunotherapy [likley] versus  progressive disease.  Overall stable- monitor for now.   # Nose bleeds: multifactorial- avastin+ platelets-currently on low-dose of Eliquis given thrombocytopenia from chemotherapy.   continue Eliquis at low-dose of 2.5 mg BID stable.   # Eye burning/tearing- on Patanol eye drop BID-  stable.   #Asymptomatic multiple brain mets subcentimeter asymptomatic-  -s/p SBRT SEP 2023- [[place]  S/p SBRT [[place]]- FEB 12th, 2024. July 9th, 2024-  Stable clinically. Decreased conspicuity of all enhancing lesions with some lesions described on the prior no longer visible and others less visible. No new lesions identified  S/p evaluation with neuro-oncology- stable. Discussed with Dr.Vaslow. will repeat imaging in OCT, 2024-  stable.   # bone metastases-Hypocalcemia: mild- continue calcium 1200 plus vitamin D-3 1000-over-the-counter-1 pill a day.  APRIL 2024-vit D 25-OH-28- on Ergocalciferol - stable.   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 Continue dose of Eliquis 2.5 mg BID.[given nose bleeds]- see above. Stable  #Bone metastases- CT DEc 7th, 2022 to healing process.  Dental evaluation on 9/21.  Continue calcium vitamin D intake. stable.   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   # IV access/ port flush.   [Tuesday/wed]- pref B12  on 8/27  # DISPOSITION:  # chemo today- Alimta+ M-vasi; ;b12 injection--do not wait for UA # as per IS- Follow up in 3  weeks- MD; port-labs- cbc/cmp; CEA; UA; Alimta+ M-vasi- Dr.B

## 2023-06-11 NOTE — Progress Notes (Signed)
Pt reports he is NOT taking any prednisone currently.

## 2023-06-12 LAB — CEA: CEA: 15.6 ng/mL — ABNORMAL HIGH (ref 0.0–4.7)

## 2023-06-14 ENCOUNTER — Encounter: Payer: Self-pay | Admitting: Internal Medicine

## 2023-06-14 ENCOUNTER — Encounter: Payer: Self-pay | Admitting: Nurse Practitioner

## 2023-06-20 ENCOUNTER — Inpatient Hospital Stay: Payer: 59 | Attending: Internal Medicine

## 2023-06-20 ENCOUNTER — Inpatient Hospital Stay: Payer: 59

## 2023-06-20 ENCOUNTER — Other Ambulatory Visit: Payer: Self-pay | Admitting: *Deleted

## 2023-06-20 ENCOUNTER — Inpatient Hospital Stay (HOSPITAL_BASED_OUTPATIENT_CLINIC_OR_DEPARTMENT_OTHER): Payer: 59 | Admitting: Hospice and Palliative Medicine

## 2023-06-20 VITALS — BP 134/99 | HR 76 | Temp 96.3°F | Wt 219.0 lb

## 2023-06-20 DIAGNOSIS — Z7901 Long term (current) use of anticoagulants: Secondary | ICD-10-CM | POA: Insufficient documentation

## 2023-06-20 DIAGNOSIS — C3432 Malignant neoplasm of lower lobe, left bronchus or lung: Secondary | ICD-10-CM

## 2023-06-20 DIAGNOSIS — C7951 Secondary malignant neoplasm of bone: Secondary | ICD-10-CM | POA: Insufficient documentation

## 2023-06-20 DIAGNOSIS — Z86718 Personal history of other venous thrombosis and embolism: Secondary | ICD-10-CM | POA: Insufficient documentation

## 2023-06-20 DIAGNOSIS — C7931 Secondary malignant neoplasm of brain: Secondary | ICD-10-CM | POA: Diagnosis not present

## 2023-06-20 DIAGNOSIS — Z79899 Other long term (current) drug therapy: Secondary | ICD-10-CM | POA: Insufficient documentation

## 2023-06-20 DIAGNOSIS — Z923 Personal history of irradiation: Secondary | ICD-10-CM | POA: Insufficient documentation

## 2023-06-20 DIAGNOSIS — C787 Secondary malignant neoplasm of liver and intrahepatic bile duct: Secondary | ICD-10-CM | POA: Diagnosis not present

## 2023-06-20 DIAGNOSIS — Z5111 Encounter for antineoplastic chemotherapy: Secondary | ICD-10-CM | POA: Diagnosis present

## 2023-06-20 DIAGNOSIS — R110 Nausea: Secondary | ICD-10-CM

## 2023-06-20 DIAGNOSIS — Z5112 Encounter for antineoplastic immunotherapy: Secondary | ICD-10-CM | POA: Insufficient documentation

## 2023-06-20 LAB — CBC WITH DIFFERENTIAL (CANCER CENTER ONLY)
Abs Immature Granulocytes: 0.04 10*3/uL (ref 0.00–0.07)
Basophils Absolute: 0 10*3/uL (ref 0.0–0.1)
Basophils Relative: 1 %
Eosinophils Absolute: 0.1 10*3/uL (ref 0.0–0.5)
Eosinophils Relative: 2 %
HCT: 38.8 % — ABNORMAL LOW (ref 39.0–52.0)
Hemoglobin: 13.1 g/dL (ref 13.0–17.0)
Immature Granulocytes: 2 %
Lymphocytes Relative: 34 %
Lymphs Abs: 0.9 10*3/uL (ref 0.7–4.0)
MCH: 34.4 pg — ABNORMAL HIGH (ref 26.0–34.0)
MCHC: 33.8 g/dL (ref 30.0–36.0)
MCV: 101.8 fL — ABNORMAL HIGH (ref 80.0–100.0)
Monocytes Absolute: 0.8 10*3/uL (ref 0.1–1.0)
Monocytes Relative: 31 %
Neutro Abs: 0.8 10*3/uL — ABNORMAL LOW (ref 1.7–7.7)
Neutrophils Relative %: 30 %
Platelet Count: 100 10*3/uL — ABNORMAL LOW (ref 150–400)
RBC: 3.81 MIL/uL — ABNORMAL LOW (ref 4.22–5.81)
RDW: 13.8 % (ref 11.5–15.5)
WBC Count: 2.7 10*3/uL — ABNORMAL LOW (ref 4.0–10.5)
nRBC: 0 % (ref 0.0–0.2)

## 2023-06-20 LAB — MAGNESIUM: Magnesium: 1.8 mg/dL (ref 1.7–2.4)

## 2023-06-20 LAB — CMP (CANCER CENTER ONLY)
ALT: 65 U/L — ABNORMAL HIGH (ref 0–44)
AST: 95 U/L — ABNORMAL HIGH (ref 15–41)
Albumin: 3.3 g/dL — ABNORMAL LOW (ref 3.5–5.0)
Alkaline Phosphatase: 194 U/L — ABNORMAL HIGH (ref 38–126)
Anion gap: 8 (ref 5–15)
BUN: 7 mg/dL (ref 6–20)
CO2: 26 mmol/L (ref 22–32)
Calcium: 8.9 mg/dL (ref 8.9–10.3)
Chloride: 101 mmol/L (ref 98–111)
Creatinine: 0.77 mg/dL (ref 0.61–1.24)
GFR, Estimated: >60 mL/min (ref >60)
Glucose, Bld: 104 mg/dL — ABNORMAL HIGH (ref 70–99)
Potassium: 4.1 mmol/L (ref 3.5–5.1)
Sodium: 135 mmol/L (ref 135–145)
Total Bilirubin: 1.1 mg/dL (ref 0.3–1.2)
Total Protein: 7 g/dL (ref 6.5–8.1)

## 2023-06-20 MED ORDER — HEPARIN SOD (PORK) LOCK FLUSH 100 UNIT/ML IV SOLN
500.0000 [IU] | Freq: Once | INTRAVENOUS | Status: AC
  Administered 2023-06-20: 500 [IU] via INTRAVENOUS
  Filled 2023-06-20: qty 5

## 2023-06-20 MED ORDER — OLANZAPINE 5 MG PO TABS: 5.0000 mg | ORAL_TABLET | Freq: Every evening | ORAL | 0 refills | Status: DC | PRN

## 2023-06-20 MED ORDER — SODIUM CHLORIDE 0.9% FLUSH
10.0000 mL | Freq: Once | INTRAVENOUS | Status: AC
  Administered 2023-06-20: 10 mL via INTRAVENOUS
  Filled 2023-06-20: qty 10

## 2023-06-20 MED ORDER — ONDANSETRON 4 MG PO TBDP: 4.0000 mg | ORAL_TABLET | Freq: Three times a day (TID) | ORAL | 1 refills | Status: DC | PRN

## 2023-06-20 MED ORDER — SERTRALINE HCL 50 MG PO TABS: 50.0000 mg | ORAL_TABLET | Freq: Every day | ORAL | 1 refills | Status: DC

## 2023-06-20 NOTE — Progress Notes (Signed)
[facility]pt[facility] at [facility] [phone] [phone]  Patient Care Team: Pcp, No as PCP - General [name], RN as Oncology Nurse Navigator [name], MD as Consulting Physician (Internal Medicine)   NAME OF PATIENT: [name]  [id number]  11/04/1980   DATE OF VISIT: 06/20/23  REASON FOR CONSULT: [name] is a 42 y.o. 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  Most recently on treatment with Alimta+ M-vasi.  INTERVAL HISTORY: Patient last saw Dr. Donneta Romberg on 06/11/2023 for cycle 6 Alimta+ M-vasi.  Presents to [facility] today with complaint of poor oral intake and nausea and vomiting.  Patient reports that since last treatment, he has had intermittent nausea and vomiting.  He says that it happened suddenly and is not associated with any known triggers.  It does not occur daily.  He is having regular bowel movements and denies diarrhea or abdominal pain.  However, he has had generalized pain for which he takes tramadol.  He has also had infrequent nosebleeds/mouth bleeding but this is short-lived.  Overall, feels poorly.  Denies recent fevers or illnesses. Denies chest pain. Denies urinary complaints. Patient offers no further specific complaints today.  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   PORTA CATH INSERTION N/A 03/28/2021   Procedure: PORTA CATH INSERTION;  Surgeon: Renford Dills, MD;  Location: [facility] INVASIVE CV LAB;  Service: Cardiovascular;  Laterality: N/A;   VIDEO BRONCHOSCOPY WITH ENDOBRONCHIAL NAVIGATION N/A 03/15/2021   Procedure: VIDEO  BRONCHOSCOPY WITH ENDOBRONCHIAL NAVIGATION;  Surgeon: Vida Rigger, MD;  Location: [facility] ORS;  Service: Thoracic;  Laterality: N/A;   VIDEO BRONCHOSCOPY WITH ENDOBRONCHIAL ULTRASOUND N/A 03/15/2021   Procedure: VIDEO BRONCHOSCOPY WITH ENDOBRONCHIAL ULTRASOUND;  Surgeon: Vida Rigger, MD;  Location: [facility] ORS;  Service: Thoracic;  Laterality: N/A;    HEMATOLOGY/ONCOLOGY HISTORY:  Oncology History Overview Note  IMPRESSION: 1. Patchy nodular fat stranding throughout the anterior left upper quadrant peritoneal fat, nonspecific, cannot exclude peritoneal carcinomatosis. Dedicated CT abdomen/pelvis with oral and IV contrast recommended for further evaluation. 2. Dense patchy consolidation replacing much of the left lower lung lobe, appearing masslike in the superior segment left lower lobe, with associated bulging of the left major fissure and associated left lower lobe volume loss. Fine nodularity throughout both lungs with an upper lobe predominance. Asymmetric left upper lobe interlobular septal thickening. These findings are indeterminate, with differential including multilobar pneumonia, sarcoidosis or a neoplastic process. The persistence on radiographs back to 01/20/2021 despite antibiotic therapy make sarcoidosis or a neoplastic process more likely. Pulmonology consultation suggested for consideration of bronchoscopic evaluation. 3. Small dependent left pleural effusion. 4. Mild mediastinal lymphadenopathy, nonspecific. 5. Subacute healing lateral right sixth rib fracture.   DIAGNOSIS:  A. LUNG, LEFT LOWER LOBE; ENB-ASSISTED BIOPSY:  - NON-SMALL CELL CARCINOMA, FAVOR ADENOCARCINOMA.  - FOREIGN MATERIAL SUGGESTIVE OF ASPIRATION.   # EGFR MUTATED: 40 del- June 28th, 2022- [name]. [limited]  # Asymptomatic multiple brain mets subcentimeter asymptomatic -JUNE 29th, 2023- Numerous metastatic lesions in the supratentorial brain-increase in number also conspicuity compared to  January 2023.  Pre-SBRT MRI brain AUG 18th, 2023-  Eighteen small enhancing brain metastases (punctate to 11 mm); were all present on 03/28/2021-s/p SBRT SEP 2023- [[place]]; SRS/ radiation treatment for brain metastasis He finished treatment on 11-26-22.   # SEP-OCT-worsening left-sided pleural effusion-status post paracentesis; exudative; Cytology negative- ?  Progressive disease.  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  While on osimertinib- NOV 1st, 2023- PET scan- shows progressive disease in liver. Liquid Biopsy/Guardant testing-positive for EGFR mutation; otherwise Negative for any novel mutations.  Repeat liver Biopsy positive for adenocarcinoma. NEGATIVE for MET amplification.  S/p second opinion at [facility]- Dr.Stinchcomb. status post 4 cycles of carbo Taxol-Atezo+Bev- JAN 25th, 2024-PET- Interval response to therapy with resolution of previously demonstrated extensive hypermetabolic activity throughout the liver. PARIL 2024- ? Partial response, but clinically worse/rising LFTs  # MAY 6th, Brain MRI-progressive multiple brain metastases  # MAY 16th, 2024- Alimta+Avastin #1  # MAY 16th, 2024-    Cancer of lower lobe of left lung (HCC)  03/23/2021 Initial Diagnosis   Cancer of lower lobe of left lung (HCC)   03/29/2021 Cancer Staging   Staging form: Lung, AJCC 8th Edition - Clinical: Stage IVB (cT3, cN3, pM1c) - Signed by [name], MD on 03/29/2021   03/30/2021 - 03/30/2021 Chemotherapy   Patient is on Treatment Plan : LUNG NSCLC Pemetrexed (Alimta) / Carboplatin q21d x 1 cycles     08/17/2022 - 01/15/2023 Chemotherapy   Patient is on Treatment Plan : LUNG Atezolizumab + Bevacizumab + Carboplatin + Paclitaxel q21d Induction x 4 cycles / Atezolizumab + Bevacizumab q21d Maintenance     02/28/2023 -  Chemotherapy   Patient is on Treatment Plan : LUNG NON-SMALL CELL Bevacizumab + Pemetrexed  q21d       ALLERGIES:  is allergic to codeine.  MEDICATIONS:  Current Outpatient Medications  Medication Sig Dispense Refill   apixaban (ELIQUIS) 2.5 MG TABS tablet Take 1 tablet (2.5 mg total) by mouth 2 (two) times daily. 60 tablet 4   calcium carbonate (TUMS EX) 750 MG chewable tablet Chew 1 tablet by mouth daily.     ciprofloxacin (CIPRO) 500 MG tablet Take 1 tablet (500 mg total) by mouth 2 (two) times daily. 14 tablet 0   folic acid (FOLVITE) 1 MG tablet TAKE 1 TABLET BY MOUTH EVERY DAY 90 tablet 1   lidocaine-prilocaine (EMLA) cream Apply 1 Application topically as needed. 30 g 0   metoprolol succinate (TOPROL-XL) 50 MG 24 hr tablet Take 50 mg by mouth daily. Take with or immediately following a meal.     metroNIDAZOLE (FLAGYL) 500 MG tablet Take 1 tablet (500 mg total) by mouth 3 (three) times daily. 15 tablet 0   sertraline (ZOLOFT) 50 MG tablet Take 1 tablet (50 mg total) by mouth daily. 30 tablet 1   traMADol (ULTRAM) 50 MG tablet Take 1 tablet (50 mg total) by mouth every 12 (twelve) hours as needed. 30 tablet 0   Vitamin D, Ergocalciferol, (DRISDOL) 1.25 MG (50000 UNIT) CAPS capsule TAKE 1 CAPSULE BY MOUTH ONE TIME PER WEEK 12 capsule 1   albuterol (VENTOLIN HFA) 108 (90 Base) MCG/ACT inhaler Inhale 2 puffs into the lungs every 6 (six) hours as needed for wheezing or shortness of breath. (Patient not taking: Reported on 06/03/2023) 8 g 2   No current facility-administered medications for this visit.   Facility-Administered Medications Ordered in Other Visits  Medication Dose  Route Frequency Provider Last Rate Last Admin   heparin lock flush 100 UNIT/ML injection            heparin lock flush 100 UNIT/ML injection            heparin lock flush 100 unit/mL  500 Units Intravenous Once Tasmin Exantus, Daryl Eastern, NP       sodium chloride flush (NS) 0.9 % injection 10 mL  10 mL Intravenous PRN [name], MD   10 mL at 07/28/21 0852    VITAL SIGNS: BP (!) 134/99 (BP Location:  Left Arm, Patient Position: Sitting, Cuff Size: Large)   Pulse 76   Temp (!) 96.3 F (35.7 C) (Tympanic)   Wt 219 lb (99.3 kg)   SpO2 98%   BMI 30.54 kg/m  Filed Weights   06/20/23 1343  Weight: 219 lb (99.3 kg)    Estimated body mass index is 30.54 kg/m as calculated from the following:   Height as of 06/11/23: 5\' 11"  (1.803 m).   Weight as of this encounter: 219 lb (99.3 kg).  LABS: CBC:    Component Value Date/Time   WBC 2.7 (L) 06/20/2023 1300   WBC 6.7 05/27/2023 0943   HGB 13.1 06/20/2023 1300   HCT 38.8 (L) 06/20/2023 1300   PLT 100 (L) 06/20/2023 1300   MCV 101.8 (H) 06/20/2023 1300   NEUTROABS 0.8 (L) 06/20/2023 1300   LYMPHSABS 0.9 06/20/2023 1300   MONOABS 0.8 06/20/2023 1300   EOSABS 0.1 06/20/2023 1300   BASOSABS 0.0 06/20/2023 1300   Comprehensive Metabolic Panel:    Component Value Date/Time   NA 137 06/11/2023 0857   K 3.6 06/11/2023 0857   CL 105 06/11/2023 0857   CO2 24 06/11/2023 0857   BUN 5 (L) 06/11/2023 0857   CREATININE 0.82 06/11/2023 0857   GLUCOSE 96 06/11/2023 0857   CALCIUM 9.0 06/11/2023 0857   AST 95 (H) 06/11/2023 0857   ALT 61 (H) 06/11/2023 0857   ALKPHOS 155 (H) 06/11/2023 0857   BILITOT 1.2 06/11/2023 0857   PROT 6.9 06/11/2023 0857   ALBUMIN 3.8 06/11/2023 0857    RADIOGRAPHIC STUDIES: DG Abd 2 Views  Result Date: 06/03/2023 CLINICAL DATA:  Abdominal pain AND vomiting- RLQ. On avastin for metastatic lung cancer. Eval for GI perforation. EXAM: ABDOMEN - 2 VIEW COMPARISON:  05/27/2023 CT abdomen/pelvis FINDINGS: No dilated small bowel loops or significant air-fluid levels. Minimal colonic stool and gas. No evidence of pneumatosis or pneumoperitoneum. At least moderate loculated left pleural effusion with left basilar patchy lung opacity, unchanged from recent CT. No radiopaque nephrolithiasis. IMPRESSION: 1. Nonobstructive bowel gas pattern. No evidence of pneumoperitoneum. 2. At least moderate loculated left pleural effusion  with left basilar patchy lung opacity, unchanged from recent CT. Electronically Signed   By: Delbert Phenix M.D.   On: 06/03/2023 16:48   US THORACENTESIS ASP PLEURAL SPACE W/IMG GUIDE  Result Date: 05/28/2023 INDICATION: Patient with a history of left lung cancer with recurrent pleural effusion. Conventional radiology asked to perform a therapeutic thoracentesis. EXAM: ULTRASOUND GUIDED THORACENTESIS MEDICATIONS: 1% lidocaine 10 mL COMPLICATIONS: None immediate. PROCEDURE: An ultrasound guided thoracentesis was thoroughly discussed with the patient and questions answered. The benefits, risks, alternatives and complications were also discussed. The patient understands and wishes to proceed with the procedure. Written consent was obtained. Ultrasound was performed to localize and mark an adequate pocket of fluid in the left chest. The area was then prepped and draped in the normal sterile fashion.  1% Lidocaine was used for local anesthesia. Under ultrasound guidance a 6 Fr Safe-T-Centesis catheter was introduced. Thoracentesis was performed. The catheter was removed and a dressing applied. FINDINGS: A total of approximately 350 mL of red/brown pleural fluid was removed. IMPRESSION: Successful ultrasound guided left thoracentesis yielding 350 mL of pleural fluid. Procedure performed by Alwyn Ren NP Electronically Signed   By: Olive Bass M.D.   On: 05/28/2023 16:16   DG Chest 1 View  Result Date: 05/28/2023 CLINICAL DATA:  Status post thoracentesis EXAM: CHEST  1 VIEW COMPARISON:  07/26/2022 FINDINGS: Cardiomediastinal silhouette and pulmonary vasculature are within normal limits. Mild interval decrease of left pleural effusion. There is no pneumothorax. Right chest port is unchanged in position. IMPRESSION: No pneumothorax status post left thoracentesis. Electronically Signed   By: Acquanetta Belling M.D.   On: 05/28/2023 16:09   CT ABDOMEN PELVIS W CONTRAST  Result Date: 05/27/2023 CLINICAL DATA:   Sudden onset right lower quadrant pain last week. History of lung cancer EXAM: CT ABDOMEN AND PELVIS WITH CONTRAST TECHNIQUE: Multidetector CT imaging of the abdomen and pelvis was performed using the standard protocol following bolus administration of intravenous contrast. RADIATION DOSE REDUCTION: This exam was performed according to the departmental dose-optimization program which includes automated exposure control, adjustment of the mA and/or kV according to patient size and/or use of iterative reconstruction technique. CONTRAST:  OMNIPAQUE IOHEXOL 300 MG/ML  SOLN COMPARISON:  PET-CT 04/22/2023 MRI 02/03/2023.  Older exams as well FINDINGS: Lower chest: Once again there is a large loculated right-sided pleural effusion with pleural thickening and adjacent lung opacity. Some volume loss as well. Interstitial septal thickening in the right lung base. No right-sided effusion. Hepatobiliary: Slightly nodular contours of the liver. Patent portal vein. There are some geographic areas of low-density seen along the superior margin of the liver towards the dome and again along segment 4/5. There are some areas of capsular retraction as well. The liver contours are similar to the previous examination. The subtle areas of lesion on the previous examination are simply not as well defined on this standard postcontrast CT without dynamic. Please correlate for known liver metastases gallbladder is mildly distended. Main and right portal veins are patent. There is truncated appearance of the left portal vein. Partial thrombus. Pancreas: Unremarkable. No pancreatic ductal dilatation or surrounding inflammatory changes. Spleen: Cephalocaudal length of 14.7 cm, enlarged. Preserved enhancement. Adrenals/Urinary Tract: Adrenal glands are preserved. No enhancing renal mass or collecting system dilatation. The ureters have normal course and caliber extending down to the bladder. Preserved contours of the urinary bladder  Stomach/Bowel: The bowel on this non oral contrast examination is nondilated. This includes the stomach and small bowel. Large bowel is nondilated but there is some areas of wall thickening along left side of the colon. There are some scattered mesenteric stranding. Please correlate for any clinical evidence of colitis. Normal appendix in the right lower quadrant. Vascular/Lymphatic: No significant vascular findings are present. No enlarged abdominal or pelvic lymph nodes. Few varices. Reproductive: Prostate is unremarkable. Other: Mesenteric stranding identified.  No ascites or free air Musculoskeletal: Curvature and degenerative changes along the spine and pelvis. There is partial sacralization of the right side of L5. IMPRESSION: Nodular liver with fatty infiltration and splenomegaly. There are once again are some geographic areas of low-density in the liver with some capsular retraction. Please correlate for known liver metastases. These are less well defined on this standard postcontrast CT. No bowel obstruction or free air. There is  some subtle wall thickening along left side of the colon. A subtle colitis is not excluded. Please correlate with symptoms. Normal appendix. Persistent large loculated left pleural effusion with pleural thickening. The main and right portal veins are patent. The left portal vein appears somewhat small, atretic. Partial thrombus is possible Electronically Signed   By: Karen Kays M.D.   On: 05/27/2023 12:48    PERFORMANCE STATUS (ECOG) : 1 - Symptomatic but completely ambulatory  Review of Systems Unless otherwise noted, a complete review of systems is negative.  Physical Exam General: NAD Cardiovascular: regular rate and rhythm Pulmonary: clear ant fields Abdomen: soft, nontender, + bowel sounds GU: no suprapubic tenderness Extremities: no edema, no joint deformities Skin: no rashes Neurological: Weakness but otherwise nonfocal  IMPRESSION/PLAN: Stage IV  non-small cell lung cancer with brain/bone metastasis -PET scan 04/22/23 showing L. Upper lobe opacities concerning for recurrent metastatic disease. Also with heterogeneous hypermetabolism in the liver suspicious for residual hepatic metastasis.  Patient is on active treatment with Avastin and Mvasi.   Nausea/vomiting -benign exam today.  Labs grossly unchanged from baseline although note slight rise in transaminases/alk phos.  Discussed with Dr. Donneta Romberg and symptoms likely from chemotherapy =/- dz progression.  Will start patient on low-dose olanzapine to see if this helps with nausea and vomiting and poor oral intake.  Will also send Rx for as needed ondansetron.  Depression-refill sertraline.  Referral to [facility].  Neutropenia/thrombocytopenia-likely secondary to chemotherapy.  Should be at or close to nadir.  Follow labs.  Neoplasm related pain -continue tramadol   Discussed scheduled supportive care visits but patient stated that he would prefer to call as needed   Patient expressed understanding and was in agreement with this plan. He also understands that He can call clinic at any time with any questions, concerns, or complaints.   Thank you for allowing me to participate in the care of this very pleasant patient.   Time Total: 25 minutes  Visit consisted of counseling and education dealing with the complex and emotionally intense issues of symptom management in the setting of serious illness.Greater than 50%  of this time was spent counseling and coordinating care related to the above assessment and plan.  Signed by: Laurette Schimke, PhD, NP-C

## 2023-06-20 NOTE — Progress Notes (Signed)
Patient is having some nausea and vomiting. Fatigued and weak, chemo two weeks ago, over the weekend went fairly well but then it turned around to where patient started hurting all over, mouth bleeding and nose bleeding, which went on sat-Sunday. Usual by Tuesday or Wednesday patient starts to feel better but this time around that isn't the case he isn't feeling any better. Patient says that he does have an appetite and is able to keep fluids down.   Patients wife stated that while patient is getting fluids she is running to their care to bring in their completed Advance Directive to scan into the patients chart.

## 2023-06-21 ENCOUNTER — Telehealth: Payer: Self-pay | Admitting: *Deleted

## 2023-06-21 NOTE — Telephone Encounter (Signed)
FMLA received via fax, form completed and sent for doctor signature

## 2023-06-24 ENCOUNTER — Encounter: Payer: Self-pay | Admitting: Internal Medicine

## 2023-06-24 ENCOUNTER — Encounter: Payer: Self-pay | Admitting: Nurse Practitioner

## 2023-06-24 NOTE — Telephone Encounter (Signed)
FMLA signed and faxed to [facility]. Receipt confirmation obtained. My Chart ms sent to  patient to inform it has been sent

## 2023-07-02 ENCOUNTER — Inpatient Hospital Stay (HOSPITAL_BASED_OUTPATIENT_CLINIC_OR_DEPARTMENT_OTHER): Payer: 59 | Admitting: Internal Medicine

## 2023-07-02 ENCOUNTER — Encounter: Payer: Self-pay | Admitting: Internal Medicine

## 2023-07-02 ENCOUNTER — Inpatient Hospital Stay: Payer: 59

## 2023-07-02 VITALS — BP 129/87 | HR 71 | Temp 95.1°F | Resp 18

## 2023-07-02 VITALS — BP 125/91 | HR 69 | Temp 95.5°F | Ht 71.0 in | Wt 216.0 lb

## 2023-07-02 DIAGNOSIS — C3432 Malignant neoplasm of lower lobe, left bronchus or lung: Secondary | ICD-10-CM

## 2023-07-02 DIAGNOSIS — Z5112 Encounter for antineoplastic immunotherapy: Secondary | ICD-10-CM | POA: Diagnosis not present

## 2023-07-02 LAB — CMP (CANCER CENTER ONLY)
ALT: 83 U/L — ABNORMAL HIGH (ref 0–44)
AST: 140 U/L — ABNORMAL HIGH (ref 15–41)
Albumin: 3.5 g/dL (ref 3.5–5.0)
Alkaline Phosphatase: 261 U/L — ABNORMAL HIGH (ref 38–126)
Anion gap: 6 (ref 5–15)
BUN: 9 mg/dL (ref 6–20)
CO2: 26 mmol/L (ref 22–32)
Calcium: 8.7 mg/dL — ABNORMAL LOW (ref 8.9–10.3)
Chloride: 103 mmol/L (ref 98–111)
Creatinine: 0.89 mg/dL (ref 0.61–1.24)
GFR, Estimated: >60 mL/min (ref >60)
Glucose, Bld: 92 mg/dL (ref 70–99)
Potassium: 3.9 mmol/L (ref 3.5–5.1)
Sodium: 135 mmol/L (ref 135–145)
Total Bilirubin: 0.6 mg/dL (ref 0.3–1.2)
Total Protein: 7 g/dL (ref 6.5–8.1)

## 2023-07-02 LAB — CBC WITH DIFFERENTIAL (CANCER CENTER ONLY)
Abs Immature Granulocytes: 0.07 10*3/uL (ref 0.00–0.07)
Basophils Absolute: 0.1 10*3/uL (ref 0.0–0.1)
Basophils Relative: 1 %
Eosinophils Absolute: 0.1 10*3/uL (ref 0.0–0.5)
Eosinophils Relative: 2 %
HCT: 45.5 % (ref 39.0–52.0)
Hemoglobin: 15 g/dL (ref 13.0–17.0)
Immature Granulocytes: 1 %
Lymphocytes Relative: 23 %
Lymphs Abs: 1.2 10*3/uL (ref 0.7–4.0)
MCH: 33.6 pg (ref 26.0–34.0)
MCHC: 33 g/dL (ref 30.0–36.0)
MCV: 102 fL — ABNORMAL HIGH (ref 80.0–100.0)
Monocytes Absolute: 1.2 10*3/uL — ABNORMAL HIGH (ref 0.1–1.0)
Monocytes Relative: 23 %
Neutro Abs: 2.6 10*3/uL (ref 1.7–7.7)
Neutrophils Relative %: 50 %
Platelet Count: 185 10*3/uL (ref 150–400)
RBC: 4.46 MIL/uL (ref 4.22–5.81)
RDW: 14.2 % (ref 11.5–15.5)
WBC Count: 5.2 10*3/uL (ref 4.0–10.5)
nRBC: 0 % (ref 0.0–0.2)

## 2023-07-02 LAB — PROTEIN, URINE, RANDOM: Total Protein, Urine: 7 mg/dL

## 2023-07-02 MED ORDER — SODIUM CHLORIDE 0.9 % IV SOLN
500.0000 mg/m2 | Freq: Once | INTRAVENOUS | Status: AC
  Administered 2023-07-02: 1100 mg via INTRAVENOUS
  Filled 2023-07-02: qty 40

## 2023-07-02 MED ORDER — PROCHLORPERAZINE MALEATE 10 MG PO TABS
10.0000 mg | ORAL_TABLET | Freq: Once | ORAL | Status: AC
  Administered 2023-07-02: 10 mg via ORAL
  Filled 2023-07-02: qty 1

## 2023-07-02 MED ORDER — SODIUM CHLORIDE 0.9 % IV SOLN
15.0000 mg/kg | Freq: Once | INTRAVENOUS | Status: AC
  Administered 2023-07-02: 1600 mg via INTRAVENOUS
  Filled 2023-07-02: qty 64

## 2023-07-02 MED ORDER — MONTELUKAST SODIUM 10 MG PO TABS: 10.0000 mg | ORAL_TABLET | Freq: Every day | ORAL | 0 refills | Status: DC

## 2023-07-02 MED ORDER — SODIUM CHLORIDE 0.9 % IV SOLN
Freq: Once | INTRAVENOUS | Status: AC
  Filled 2023-07-02: qty 250

## 2023-07-02 MED ORDER — HEPARIN SOD (PORK) LOCK FLUSH 100 UNIT/ML IV SOLN
500.0000 [IU] | Freq: Once | INTRAVENOUS | Status: AC | PRN
  Administered 2023-07-02: 500 [IU]
  Filled 2023-07-02: qty 5

## 2023-07-02 MED ORDER — CYANOCOBALAMIN 1000 MCG/ML IJ SOLN: 1000.0000 ug | Freq: Once | INTRAMUSCULAR | Status: DC

## 2023-07-02 NOTE — Patient Instructions (Signed)
[facility]  Discharge Instructions: Thank you for choosing [facility] to provide your oncology and hematology care.  If you have a lab appointment with the Cancer Center, please go directly to the Cancer Center and check in at the registration area.  Wear comfortable clothing and clothing appropriate for easy access to any Portacath or PICC line.   We strive to give you quality time with your provider. You may need to reschedule your appointment if you arrive late (15 or more minutes).  Arriving late affects you and other patients whose appointments are after yours.  Also, if you miss three or more appointments without notifying the office, you may be dismissed from the clinic at the provider's discretion.      For prescription refill requests, have your pharmacy contact our office and allow 72 hours for refills to be completed.    Today you received the following chemotherapy and/or immunotherapy agents mvasi and alimta      To help prevent nausea and vomiting after your treatment, we encourage you to take your nausea medication as directed.  BELOW ARE SYMPTOMS THAT SHOULD BE REPORTED IMMEDIATELY: *FEVER GREATER THAN 100.4 F (38 C) OR HIGHER *CHILLS OR SWEATING *NAUSEA AND VOMITING THAT IS NOT CONTROLLED WITH YOUR NAUSEA MEDICATION *UNUSUAL SHORTNESS OF BREATH *UNUSUAL BRUISING OR BLEEDING *URINARY PROBLEMS (pain or burning when urinating, or frequent urination) *BOWEL PROBLEMS (unusual diarrhea, constipation, pain near the anus) TENDERNESS IN MOUTH AND THROAT WITH OR WITHOUT PRESENCE OF ULCERS (sore throat, sores in mouth, or a toothache) UNUSUAL RASH, SWELLING OR PAIN  UNUSUAL VAGINAL DISCHARGE OR ITCHING   Items with * indicate a potential emergency and should be followed up as soon as possible or go to the Emergency Department if any problems should occur.  Please show the CHEMOTHERAPY ALERT CARD or IMMUNOTHERAPY ALERT CARD at  check-in to the Emergency Department and triage nurse.  Should you have questions after your visit or need to cancel or reschedule your appointment, please contact [facility]  [phone] and follow the prompts.  Office hours are 8:00 a.m. to 4:30 p.m. Monday - Friday. Please note that voicemails left after 4:00 p.m. may not be returned until the following business day.  We are closed weekends and major holidays. You have access to a nurse at all times for urgent questions. Please call the main number to the clinic [phone] and follow the prompts.  For any non-urgent questions, you may also contact your provider using MyChart. We now offer e-Visits for anyone 64 and older to request care online for non-urgent symptoms. For details visit [email or web address].   Also download the MyChart app! Go to the app store, search "MyChart", open the app, select [facility], and log in with your MyChart username and password.

## 2023-07-02 NOTE — Assessment & Plan Note (Addendum)
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 s /p second opinion at [facility]- Dr.Stinchcomb. status post 4 cycles of carbo Taxol-Atezo+Bev- JAN 25th, 2024-PET- Interval response to therapy with resolution of previously demonstrated extensive hypermetabolic activity throughout the liver.  PET scan April 17th- Unchanged appearance of chronic loculated left pleural effusion/fibrothorax with new hypermetabolic activity of the pleura and adjacent consolidations, findings may be due to pseudoprogression or progressive disease. New irregular areas of hypermetabolic activity in the liver associated with previously described metastatic liver lesions, findings may be due to pseudoprogression or progressive disease. April 21st- liver MRI-  showed positive response to therapy when compared to prior abdominal MRI in terms of regression of diffuse hepatic metastatic disease.  Much of these imaging findings may be attributable to developing areas of hepatic fibrosis at the site of treated lesions, however, given the hypermetabolism in these regions on the recent PET-CT, some degree of residual tumor is not excluded. No new lesions are identified.   However clinically concerned about progression esp in liver- Discontinued Cycle #5  -Atezo + bev-given clinical concerns of progression [extreme fatigue rising LFTs].  # currently on Alimta+ M-vasi. JULY 13th, 2024- Subpleural left upper lobe opacities, raising concern for recurrent/metastatic disease. Associated chronic left pleural effusion. Heterogeneous hypermetabolism in the peripheral liver, similar to the prior, suspicious for some degree of residual hepatic metastases. stable.   # proceed with  Alimta+ M-vasi s/p cyle #7 . Labs-CBC/chemistries were reviewed with the patient. Will repeat PET scan today. Ordered today. Discussed my concerns of clinical progression. Also discussed re: evaluation at higher centers, like [facility]  [facility].   # URI/? Allergies- no concerns for infectious process- continue Clairitn; recommend singulair; also prn albuterol.  # Elevated LFTs: Normal bilirubin-question related to immunotherapy [UNlikley] versus progressive disease [most likely]  Rising- await repeat PET scan.   # Nose bleeds: multifactorial- avastin+ platelets-currently on low-dose of Eliquis given thrombocytopenia from chemotherapy.   continue Eliquis at low-dose of 2.5 mg BID stable.   # Eye burning/tearing- on Patanol eye drop BID- stable.   #Asymptomatic multiple brain mets subcentimeter asymptomatic-  -s/p SBRT SEP 2023- [[place]].  S/p SBRT [[place]]- FEB 12th, 2024. July 9th, 2024-  Stable clinically. Decreased conspicuity of all enhancing lesions with some lesions described on the prior no longer visible and others less visible. No new lesions identified  S/p evaluation with neuro-oncology- stable. Discussed with Dr.Vaslow. will repeat imaging in OCT, 2024-  stable.   # bone metastases-Hypocalcemia: mild- continue calcium 1200 plus vitamin D-3 1000-over-the-counter-1 pill a day.  APRIL 2024-vit D 25-OH-28- on Ergocalciferol -  stable.   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 Continue dose of Eliquis 2.5 mg BID.[given nose bleeds]- see above. Stable  #Bone metastases- CT DEc 7th, 2022 to healing process.  Dental evaluation on 9/21.  Continue calcium vitamin D intake.  stable.   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   # IV access/ port flush.   [Tuesday/wed]- pref B12  on 8/27  # DISPOSITION:  # chemo today- Alimta+ M-vasi; do not wait for UA # as per IS- Follow up in 3  weeks- MD; port-labs- cbc/cmp; CEA; UA; Alimta+ M-vasi; PET scan in 2 weeks- Dr.B

## 2023-07-02 NOTE — Progress Notes (Signed)
Pt states not feeling good. Nausea and vomiting. Runny nose and cough x5 days.

## 2023-07-02 NOTE — Progress Notes (Signed)
[facility] CONSULT NOTE  Patient Care Team: Pcp, No as PCP - General Nathan Buff, RN as Oncology Nurse Navigator Nathan Coder, MD as Consulting Physician (Internal Medicine)  CHIEF COMPLAINTS/PURPOSE OF CONSULTATION: Lung cancer  #  Oncology History Overview Note  IMPRESSION: 1. Patchy nodular fat stranding throughout the anterior left upper quadrant peritoneal fat, nonspecific, cannot exclude peritoneal carcinomatosis. Dedicated CT abdomen/pelvis with oral and IV contrast recommended for further evaluation. 2. Dense patchy consolidation replacing much of the left lower lung lobe, appearing masslike in the superior segment left lower lobe, with associated bulging of the left major fissure and associated left lower lobe volume loss. Fine nodularity throughout both lungs with an upper lobe predominance. Asymmetric left upper lobe interlobular septal thickening. These findings are indeterminate, with differential including multilobar pneumonia, sarcoidosis or a neoplastic process. The persistence on radiographs back to 01/20/2021 despite antibiotic therapy make sarcoidosis or a neoplastic process more likely. Pulmonology consultation suggested for consideration of bronchoscopic evaluation. 3. Small dependent left pleural effusion. 4. Mild mediastinal lymphadenopathy, nonspecific. 5. Subacute healing lateral right sixth rib fracture.   DIAGNOSIS:  A. LUNG, LEFT LOWER LOBE; ENB-ASSISTED BIOPSY:  - NON-SMALL CELL CARCINOMA, FAVOR ADENOCARCINOMA.  - FOREIGN MATERIAL SUGGESTIVE OF ASPIRATION.   # EGFR MUTATED: 60 del- June 28th, 2022- [name]. [limited]  # Asymptomatic multiple brain mets subcentimeter asymptomatic -JUNE 29th, 2023- Numerous metastatic lesions in the supratentorial brain-increase in number also conspicuity compared to January 2023.  Pre-SBRT MRI brain AUG 18th, 2023-  Eighteen small enhancing brain metastases (punctate to 11 mm); were all  present on 03/28/2021-s/p SBRT SEP 2023- [[place]]; SRS/ radiation treatment for brain metastasis He finished treatment on 11-26-22.   # SEP-OCT-worsening left-sided pleural effusion-status post paracentesis; exudative; Cytology negative- ?  Progressive disease.  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  While on osimertinib- NOV 1st, 2023- PET scan- shows progressive disease in liver. Liquid Biopsy/Guardant testing-positive for EGFR mutation; otherwise Negative for any novel mutations.  Repeat liver Biopsy positive for adenocarcinoma. NEGATIVE for MET amplification.  S/p second opinion at [facility]- Dr.Stinchcomb. status post 4 cycles of carbo Taxol-Atezo+Bev- JAN 25th, 2024-PET- Interval response to therapy with resolution of previously demonstrated extensive hypermetabolic activity throughout the liver. PARIL 2024- ? Partial response, but clinically worse/rising LFTs  # MAY 6th, Brain MRI-progressive multiple brain metastases  # MAY 16th, 2024- Alimta+Avastin #1    Cancer of lower lobe of left lung (HCC)  03/23/2021 Initial Diagnosis   Cancer of lower lobe of left lung (HCC)   03/29/2021 Cancer Staging   Staging form: Lung, AJCC 8th Edition - Clinical: Stage IVB (cT3, cN3, pM1c) - Signed by Nathan Coder, MD on 03/29/2021   03/30/2021 - 03/30/2021 Chemotherapy   Patient is on Treatment Plan : LUNG NSCLC Pemetrexed (Alimta) / Carboplatin q21d x 1 cycles     08/17/2022 - 01/15/2023 Chemotherapy   Patient is on Treatment Plan : LUNG Atezolizumab + Bevacizumab + Carboplatin + Paclitaxel q21d Induction x 4 cycles / Atezolizumab + Bevacizumab q21d Maintenance     02/28/2023 -  Chemotherapy   Patient is on Treatment Plan : LUNG NON-SMALL CELL Bevacizumab + Pemetrexed q21d      HISTORY OF PRESENTING ILLNESS: Ambulating independently.  Accompanied by his wife.  [name] 42 y.o.  male patient with  stage IV lung cancer adenocarcinoma-brain mets [synchronous] bone mets EGFR mutated -currently on  Alimta- Bev is here for follow-up. Patient currently on Alimta+ M-vasi s/p cycle #6- appx 3 weeks ago.   In the interim patient was evaluated in [facility] for overall feeling poorly nausea.  However no acute issues were noted.  Patient continues to states not feeling good. Intermittent nausea and vomiting. Positive for weight loss.  Poor appetite. Mild abdominal discomfort.   Patient complains of runny nose and cough x 5 days; yellow phlegm; no blood. S/p claritin- slightly better. No fevers. No chills. Intermittent wheezing. No shortness of breath.   No worsening headaches. Currently on  Eliquis 2.5mg  bid due to bleeding from his nose.  No worsening skin rash.   Review of Systems  Constitutional:  Positive for malaise/fatigue. Negative for chills, diaphoresis and fever.  HENT:  Negative for nosebleeds and sore throat.   Eyes:  Negative for double vision.  Respiratory:  Positive for cough. Negative for wheezing.   Cardiovascular:  Negative for chest pain, palpitations, orthopnea and leg swelling.  Gastrointestinal:  Positive for heartburn and nausea. Negative for abdominal pain, blood in stool, diarrhea and melena.  Genitourinary:  Negative for dysuria, frequency and urgency.  Musculoskeletal:  Negative for back pain and joint pain.  Skin:  Negative for itching.  Neurological:  Negative for tingling and focal weakness.  Endo/Heme/Allergies:  Does not bruise/bleed easily.  Psychiatric/Behavioral:  Negative for depression. The patient is not nervous/anxious and does not have insomnia.      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    SURGICAL HISTORY: Past Surgical History:  Procedure Laterality Date   FRACTURE SURGERY     broke femur when he was 6 years   PORTA  CATH INSERTION N/A 03/28/2021   Procedure: PORTA CATH INSERTION;  Surgeon: Nathan Dills, MD;  Location: [facility] INVASIVE CV LAB;  Service: Cardiovascular;  Laterality: N/A;   VIDEO BRONCHOSCOPY WITH ENDOBRONCHIAL NAVIGATION N/A 03/15/2021   Procedure: VIDEO BRONCHOSCOPY WITH ENDOBRONCHIAL NAVIGATION;  Surgeon: Vida Rigger, MD;  Location: [facility] ORS;  Service: Thoracic;  Laterality: N/A;   VIDEO BRONCHOSCOPY WITH ENDOBRONCHIAL ULTRASOUND N/A 03/15/2021   Procedure: VIDEO BRONCHOSCOPY WITH ENDOBRONCHIAL ULTRASOUND;  Surgeon: Vida Rigger, MD;  Location: [facility] ORS;  Service: Thoracic;  Laterality: N/A;    SOCIAL HISTORY: Social History   Socioeconomic History   Marital status: Married    Spouse name: Darl Pikes   Number of children: 2   Years of education: Not on file   Highest education level: Not on file  Occupational History   Not on file  Tobacco Use   Smoking status: Never   Smokeless tobacco: Former    Types: Snuff, Chew  Vaping Use   Vaping status: Never Used  Substance and Sexual Activity   Alcohol use: Never   Drug use: Never   Sexual activity: Yes  Other Topics Concern   Not on file  Social History Narrative   [place]; with wife; 2 daughters[12 and 22]; never smoked; rare alcohol. 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 Dog and cat.   Social Determinants of Health   Financial Resource Strain: Low Risk  (05/10/2022)   Overall Financial Resource Strain (CARDIA)    Difficulty of Paying Living Expenses: Not hard at all  Food Insecurity: No Food Insecurity (05/10/2022)   Hunger Vital Sign    Worried About Running Out of Food  in the Last Year: Never true    Ran Out of Food in the Last Year: Never true  Transportation Needs: No Transportation Needs (03/16/2022)   PRAPARE - Administrator, Civil Service (Medical): No    Lack of Transportation (Non-Medical): No  Physical Activity: Not on file  Stress: Not on file  Social Connections: Socially Integrated (05/10/2022)   Social  Connection and Isolation Panel [NHANES]    Frequency of Communication with Friends and Family: More than three times a week    Frequency of Social Gatherings with Friends and Family: More than three times a week    Attends Religious Services: More than 4 times per year    Active Member of [employer] or Organizations: Yes    Attends Engineer, structural: More than 4 times per year    Marital Status: Married  Catering manager Violence: Not on file    FAMILY HISTORY: Family History  Problem Relation Age of Onset   High Cholesterol Mother    Hypertension Father    Lung cancer Father    Skin cancer Father     ALLERGIES:  is allergic to codeine.  MEDICATIONS:  Current Outpatient Medications  Medication Sig Dispense Refill   apixaban (ELIQUIS) 2.5 MG TABS tablet Take 1 tablet (2.5 mg total) by mouth 2 (two) times daily. 60 tablet 4   calcium carbonate (TUMS EX) 750 MG chewable tablet Chew 1 tablet by mouth daily.     folic acid (FOLVITE) 1 MG tablet TAKE 1 TABLET BY MOUTH EVERY DAY 90 tablet 1   lidocaine-prilocaine (EMLA) cream Apply 1 Application topically as needed. 30 g 0   metoprolol succinate (TOPROL-XL) 50 MG 24 hr tablet Take 50 mg by mouth daily. Take with or immediately following a meal.     montelukast (SINGULAIR) 10 MG tablet Take 1 tablet (10 mg total) by mouth at bedtime. 30 tablet 0   OLANZapine (ZYPREXA) 5 MG tablet Take 1 tablet (5 mg total) by mouth at bedtime as needed (nausea). 30 tablet 0   ondansetron (ZOFRAN-ODT) 4 MG disintegrating tablet Take 1 tablet (4 mg total) by mouth every 8 (eight) hours as needed for nausea or vomiting. 20 tablet 1   sertraline (ZOLOFT) 50 MG tablet Take 1 tablet (50 mg total) by mouth daily. 30 tablet 1   traMADol (ULTRAM) 50 MG tablet Take 1 tablet (50 mg total) by mouth every 12 (twelve) hours as needed. 30 tablet 0   Vitamin D, Ergocalciferol, (DRISDOL) 1.25 MG (50000 UNIT) CAPS capsule TAKE 1 CAPSULE BY MOUTH ONE TIME PER WEEK 12  capsule 1   albuterol (VENTOLIN HFA) 108 (90 Base) MCG/ACT inhaler Inhale 2 puffs into the lungs every 6 (six) hours as needed for wheezing or shortness of breath. (Patient not taking: Reported on 06/03/2023) 8 g 2   No current facility-administered medications for this visit.   Facility-Administered Medications Ordered in Other Visits  Medication Dose Route Frequency Provider Last Rate Last Admin   bevacizumab-awwb (MVASI) 1,600 mg in sodium chloride 0.9 % 100 mL chemo infusion  15 mg/kg (Treatment Plan Recorded) Intravenous Once Louretta Shorten R, MD       heparin lock flush 100 UNIT/ML injection            heparin lock flush 100 UNIT/ML injection            PEMEtrexed (ALIMTA) 1,100 mg in sodium chloride 0.9 % 100 mL chemo infusion  500 mg/m2 (Treatment Plan Recorded) Intravenous  Once Nathan Coder, MD       sodium chloride flush (NS) 0.9 % injection 10 mL  10 mL Intravenous PRN Nathan Coder, MD   10 mL at 07/28/21 0852      .  PHYSICAL EXAMINATION: ECOG PERFORMANCE STATUS: 1 - Symptomatic but completely ambulatory  Vitals:   07/02/23 0815 07/02/23 0840  BP: (!) 142/101 (!) 125/91  Pulse: 69   Temp: (!) 95.5 F (35.3 C)   SpO2: 96%         Filed Weights   07/02/23 0815  Weight: 216 lb (98 kg)      Physical Exam HENT:     Head: Normocephalic and atraumatic.     Mouth/Throat:     Pharynx: No oropharyngeal exudate.  Eyes:     Pupils: Pupils are equal, round, and reactive to light.  Cardiovascular:     Rate and Rhythm: Normal rate and regular rhythm.  Pulmonary:     Effort: No respiratory distress.     Breath sounds: No wheezing.     Comments: Decreased breath sound on the left side compared to right. Abdominal:     General: Bowel sounds are normal. There is no distension.     Palpations: Abdomen is soft. There is no mass.     Tenderness: There is no abdominal tenderness. There is no guarding or rebound.  Musculoskeletal:        General: No  tenderness. Normal range of motion.     Cervical back: Normal range of motion and neck supple.  Skin:    General: Skin is warm.  Neurological:     Mental Status: He is alert and oriented to person, place, and time.  Psychiatric:        Mood and Affect: Affect normal.    Fungal dermatitis bilateral buttocks groin/underneath abdominal pannus  LABORATORY DATA:  I have reviewed the data as listed Lab Results  Component Value Date   WBC 5.2 07/02/2023   HGB 15.0 07/02/2023   HCT 45.5 07/02/2023   MCV 102.0 (H) 07/02/2023   PLT 185 07/02/2023   Recent Labs    06/11/23 0857 06/20/23 1300 07/02/23 0814  NA 137 135 135  K 3.6 4.1 3.9  CL 105 101 103  CO2 24 26 26   GLUCOSE 96 104* 92  BUN 5* 7 9  CREATININE 0.82 0.77 0.89  CALCIUM 9.0 8.9 8.7*  GFRNONAA >60 >60 >60  PROT 6.9 7.0 7.0  ALBUMIN 3.8 3.3* 3.5  AST 95* 95* 140*  ALT 61* 65* 83*  ALKPHOS 155* 194* 261*  BILITOT 1.2 1.1 0.6    RADIOGRAPHIC STUDIES: I have personally reviewed the radiological images as listed and agreed with the findings in the report. DG Abd 2 Views  Result Date: 06/03/2023 CLINICAL DATA:  Abdominal pain AND vomiting- RLQ. On avastin for metastatic lung cancer. Eval for GI perforation. EXAM: ABDOMEN - 2 VIEW COMPARISON:  05/27/2023 CT abdomen/pelvis FINDINGS: No dilated small bowel loops or significant air-fluid levels. Minimal colonic stool and gas. No evidence of pneumatosis or pneumoperitoneum. At least moderate loculated left pleural effusion with left basilar patchy lung opacity, unchanged from recent CT. No radiopaque nephrolithiasis. IMPRESSION: 1. Nonobstructive bowel gas pattern. No evidence of pneumoperitoneum. 2. At least moderate loculated left pleural effusion with left basilar patchy lung opacity, unchanged from recent CT. Electronically Signed   By: Delbert Phenix M.D.   On: 06/03/2023 16:48    ASSESSMENT & PLAN:   Cancer of lower  lobe of left lung (HCC) # STAGE IV- [JUNE 2022] Left  lower lobe lung cancer-adenocarcinoma the lung;  EGFR- 19 del POSITIVE. s /p second opinion at [facility]- Dr.Stinchcomb. status post 4 cycles of carbo Taxol-Atezo+Bev- JAN 25th, 2024-PET- Interval response to therapy with resolution of previously demonstrated extensive hypermetabolic activity throughout the liver.  PET scan April 17th- Unchanged appearance of chronic loculated left pleural effusion/fibrothorax with new hypermetabolic activity of the pleura and adjacent consolidations, findings may be due to pseudoprogression or progressive disease. New irregular areas of hypermetabolic activity in the liver associated with previously described metastatic liver lesions, findings may be due to pseudoprogression or progressive disease. April 21st- liver MRI-  showed positive response to therapy when compared to prior abdominal MRI in terms of regression of diffuse hepatic metastatic disease.  Much of these imaging findings may be attributable to developing areas of hepatic fibrosis at the site of treated lesions, however, given the hypermetabolism in these regions on the recent PET-CT, some degree of residual tumor is not excluded. No new lesions are identified.   However clinically concerned about progression esp in liver- Discontinued Cycle #5  -Atezo + bev-given clinical concerns of progression [extreme fatigue rising LFTs].  # currently on Alimta+ M-vasi. JULY 13th, 2024- Subpleural left upper lobe opacities, raising concern for recurrent/metastatic disease. Associated chronic left pleural effusion. Heterogeneous hypermetabolism in the peripheral liver, similar to the prior, suspicious for some degree of residual hepatic metastases. stable.   # proceed with  Alimta+ M-vasi s/p cyle #7 . Labs-CBC/chemistries were reviewed with the patient. Will repeat PET scan today. Ordered today. Discussed my concerns of clinical progression. Also discussed re: evaluation at higher centers, like [facility].   # URI/? Allergies- no  concerns for infectious process- continue Clairitn; recommend singulair; also prn albuterol.  # Elevated LFTs: Normal bilirubin-question related to immunotherapy [UNlikley] versus progressive disease [most likely]  Rising- await repeat PET scan.   # Nose bleeds: multifactorial- avastin+ platelets-currently on low-dose of Eliquis given thrombocytopenia from chemotherapy.   continue Eliquis at low-dose of 2.5 mg BID stable.   # Eye burning/tearing- on Patanol eye drop BID- stable.   #Asymptomatic multiple brain mets subcentimeter asymptomatic-  -s/p SBRT SEP 2023- [[place]].  S/p SBRT [[place]]- FEB 12th, 2024. July 9th, 2024-  Stable clinically. Decreased conspicuity of all enhancing lesions with some lesions described on the prior no longer visible and others less visible. No new lesions identified  S/p evaluation with neuro-oncology- stable. Discussed with Dr.Vaslow. will repeat imaging in OCT, 2024-  stable.   # bone metastases-Hypocalcemia: mild- continue calcium 1200 plus vitamin D-3 1000-over-the-counter-1 pill a day.  APRIL 2024-vit D 25-OH-28- on Ergocalciferol -  stable.   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 Continue dose of Eliquis 2.5 mg BID.[given nose bleeds]- see above. Stable  #Bone metastases- CT DEc 7th, 2022 to healing process.  Dental evaluation on 9/21.  Continue calcium vitamin D intake.  stable.   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   # IV access/ port flush.   [Tuesday/wed]- pref B12  on 8/27  # DISPOSITION:  # chemo today- Alimta+ M-vasi; do not wait for UA # as per IS- Follow up in 3  weeks- MD; port-labs- cbc/cmp; CEA; UA; Alimta+ M-vasi; PET scan  in 2 weeks- Dr.B       All questions were answered. The  patient knows to call the clinic with any problems, questions or concerns.    Nathan Coder, MD 07/02/2023 9:39 AM

## 2023-07-09 NOTE — Progress Notes (Signed)
Dear Referring Specialist:  Thank you for referring your patient to [facility]. Below you will find my new psychiatric medication recommendations.  Given the patient's history of [employer] Syndrome, consider repeat EKG if he is regularly taking medications that can impact cardiac conduction such as prochloperazine and olanzapine.  Once reviewed, please confirm prescription(s) within 2 business days. If you modify or decline the recommendations, please document these modifications and notify us. If you have any questions or concerns, please call our [facility] provider line at [phone].  Best,   Demetria Pore, MD  ---  Name: Danford Bad "Ryan" Date of Birth: 03-28-1981 Referring Provider: Laurette Schimke, NP Patient MRN: [id number]

## 2023-07-09 NOTE — Progress Notes (Signed)
[name]. [name] "[name]" DOB 01-27-1981  Final Intake Summary: [name] a.k.a. 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 [name] was initially dx'd with cancer in 2022. An MRI in May of this year demonstrated progression in multiple brain metastases. [name] may be open to psychiatric medication recommendations, but feels stable on his current sertraline 50mg  prescribed by NP [facility].  Assessments:  [name] scored PHQ-9=2 (minimal), reporting poor appetite and trouble concentrating. [name] noted his sxs are minimal at time of assessment d/t timing between chemo rounds. [name] scored GAD-7=2 (minimal), reporting trouble relaxing. [name] denied any significant anxiety and reported having acceptance and peace around his condition. He stated there are things he dreads, such as total brain radiation, but he would not consider this anxiety-provoking.   Somatic Sxs: [name] reported SOB that makes a brisk walk "too much" and dealt with a plethora of side effects after last chemo, including nosebleeds, abdominal/muscle/bone pains, high BP, and severe fatigue. Lately he has been feeling much better.  Psych Tx/Hx: [name] is prescribed Sertraline 50mg  daily by NP Laurette Schimke. He initiated Sertraline at 25mg  on 03/29/2021 after a hospitalization and then titrated up to 50mg  after 1 month.   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 [name] enjoys hunting in the fall and winter.  Assigned Dx: F43.20: adjustment disorder, unspecified

## 2023-07-11 ENCOUNTER — Telehealth: Payer: Self-pay | Admitting: *Deleted

## 2023-07-11 NOTE — Telephone Encounter (Signed)
Received FMLA for this patient. Form completed and sent for physician signature

## 2023-07-12 ENCOUNTER — Other Ambulatory Visit: Payer: Self-pay | Admitting: Hospice and Palliative Medicine

## 2023-07-12 ENCOUNTER — Encounter: Payer: Self-pay | Admitting: Internal Medicine

## 2023-07-12 ENCOUNTER — Encounter: Payer: Self-pay | Admitting: Nurse Practitioner

## 2023-07-12 NOTE — Telephone Encounter (Signed)
Form signed, faxed and confirmatio n of receipt obtained

## 2023-07-14 ENCOUNTER — Encounter: Payer: Self-pay | Admitting: Internal Medicine

## 2023-07-15 DIAGNOSIS — F4320 Adjustment disorder, unspecified: Secondary | ICD-10-CM

## 2023-07-16 ENCOUNTER — Ambulatory Visit
Admission: RE | Admit: 2023-07-16 | Discharge: 2023-07-16 | Disposition: A | Discharge status: Home / Self Care | Payer: Managed Care, Other (non HMO) | Source: Ambulatory Visit | Attending: Internal Medicine | Admitting: Internal Medicine

## 2023-07-16 DIAGNOSIS — C3432 Malignant neoplasm of lower lobe, left bronchus or lung: Secondary | ICD-10-CM | POA: Diagnosis present

## 2023-07-16 DIAGNOSIS — Z923 Personal history of irradiation: Secondary | ICD-10-CM | POA: Insufficient documentation

## 2023-07-16 DIAGNOSIS — C7951 Secondary malignant neoplasm of bone: Secondary | ICD-10-CM | POA: Insufficient documentation

## 2023-07-16 DIAGNOSIS — C787 Secondary malignant neoplasm of liver and intrahepatic bile duct: Secondary | ICD-10-CM | POA: Diagnosis not present

## 2023-07-16 DIAGNOSIS — J90 Pleural effusion, not elsewhere classified: Secondary | ICD-10-CM | POA: Diagnosis not present

## 2023-07-16 DIAGNOSIS — C7931 Secondary malignant neoplasm of brain: Secondary | ICD-10-CM | POA: Diagnosis not present

## 2023-07-16 DIAGNOSIS — R188 Other ascites: Secondary | ICD-10-CM | POA: Insufficient documentation

## 2023-07-16 DIAGNOSIS — K449 Diaphragmatic hernia without obstruction or gangrene: Secondary | ICD-10-CM | POA: Diagnosis not present

## 2023-07-16 LAB — GLUCOSE, CAPILLARY: Glucose-Capillary: 93 mg/dL (ref 70–99)

## 2023-07-16 MED ORDER — HEPARIN SOD (PORK) LOCK FLUSH 100 UNIT/ML IV SOLN
INTRAVENOUS | Status: AC
  Filled 2023-07-16: qty 5

## 2023-07-16 MED ORDER — FLUDEOXYGLUCOSE F - 18 (FDG) INJECTION
11.2000 | Freq: Once | INTRAVENOUS | Status: AC
  Administered 2023-07-16: 11.88 via INTRAVENOUS

## 2023-07-22 ENCOUNTER — Inpatient Hospital Stay: Admission: RE | Admit: 2023-07-22 | Payer: Managed Care, Other (non HMO) | Source: Ambulatory Visit

## 2023-07-22 ENCOUNTER — Other Ambulatory Visit: Payer: Self-pay | Admitting: *Deleted

## 2023-07-22 DIAGNOSIS — C3432 Malignant neoplasm of lower lobe, left bronchus or lung: Secondary | ICD-10-CM

## 2023-07-23 ENCOUNTER — Ambulatory Visit: Payer: Managed Care, Other (non HMO) | Admitting: Internal Medicine

## 2023-07-23 ENCOUNTER — Other Ambulatory Visit: Payer: Managed Care, Other (non HMO)

## 2023-07-23 ENCOUNTER — Inpatient Hospital Stay (HOSPITAL_BASED_OUTPATIENT_CLINIC_OR_DEPARTMENT_OTHER): Payer: Managed Care, Other (non HMO) | Admitting: Internal Medicine

## 2023-07-23 ENCOUNTER — Ambulatory Visit: Payer: Managed Care, Other (non HMO)

## 2023-07-23 ENCOUNTER — Encounter: Payer: Self-pay | Admitting: Internal Medicine

## 2023-07-23 ENCOUNTER — Inpatient Hospital Stay: Payer: Managed Care, Other (non HMO) | Attending: Internal Medicine

## 2023-07-23 ENCOUNTER — Inpatient Hospital Stay: Payer: Managed Care, Other (non HMO)

## 2023-07-23 VITALS — BP 122/84 | HR 66 | Temp 97.0°F | Resp 18 | Wt 228.8 lb

## 2023-07-23 DIAGNOSIS — Z5112 Encounter for antineoplastic immunotherapy: Secondary | ICD-10-CM | POA: Insufficient documentation

## 2023-07-23 DIAGNOSIS — C3432 Malignant neoplasm of lower lobe, left bronchus or lung: Secondary | ICD-10-CM

## 2023-07-23 DIAGNOSIS — C787 Secondary malignant neoplasm of liver and intrahepatic bile duct: Secondary | ICD-10-CM | POA: Insufficient documentation

## 2023-07-23 DIAGNOSIS — R112 Nausea with vomiting, unspecified: Secondary | ICD-10-CM | POA: Insufficient documentation

## 2023-07-23 DIAGNOSIS — Z5111 Encounter for antineoplastic chemotherapy: Secondary | ICD-10-CM | POA: Diagnosis present

## 2023-07-23 DIAGNOSIS — Z86718 Personal history of other venous thrombosis and embolism: Secondary | ICD-10-CM | POA: Insufficient documentation

## 2023-07-23 DIAGNOSIS — R188 Other ascites: Secondary | ICD-10-CM | POA: Insufficient documentation

## 2023-07-23 DIAGNOSIS — I471 Supraventricular tachycardia, unspecified: Secondary | ICD-10-CM | POA: Insufficient documentation

## 2023-07-23 DIAGNOSIS — Z7901 Long term (current) use of anticoagulants: Secondary | ICD-10-CM | POA: Insufficient documentation

## 2023-07-23 DIAGNOSIS — C7951 Secondary malignant neoplasm of bone: Secondary | ICD-10-CM | POA: Insufficient documentation

## 2023-07-23 DIAGNOSIS — Z452 Encounter for adjustment and management of vascular access device: Secondary | ICD-10-CM | POA: Diagnosis not present

## 2023-07-23 DIAGNOSIS — K449 Diaphragmatic hernia without obstruction or gangrene: Secondary | ICD-10-CM | POA: Insufficient documentation

## 2023-07-23 DIAGNOSIS — C7931 Secondary malignant neoplasm of brain: Secondary | ICD-10-CM | POA: Diagnosis not present

## 2023-07-23 DIAGNOSIS — D6959 Other secondary thrombocytopenia: Secondary | ICD-10-CM | POA: Diagnosis not present

## 2023-07-23 LAB — CMP (CANCER CENTER ONLY)
ALT: 77 U/L — ABNORMAL HIGH (ref 0–44)
AST: 125 U/L — ABNORMAL HIGH (ref 15–41)
Albumin: 3.4 g/dL — ABNORMAL LOW (ref 3.5–5.0)
Alkaline Phosphatase: 288 U/L — ABNORMAL HIGH (ref 38–126)
Anion gap: 8 (ref 5–15)
BUN: 9 mg/dL (ref 6–20)
CO2: 27 mmol/L (ref 22–32)
Calcium: 8.6 mg/dL — ABNORMAL LOW (ref 8.9–10.3)
Chloride: 101 mmol/L (ref 98–111)
Creatinine: 0.83 mg/dL (ref 0.61–1.24)
GFR, Estimated: >60 mL/min (ref >60)
Glucose, Bld: 74 mg/dL (ref 70–99)
Potassium: 3 mmol/L — ABNORMAL LOW (ref 3.5–5.1)
Sodium: 136 mmol/L (ref 135–145)
Total Bilirubin: 1 mg/dL (ref 0.3–1.2)
Total Protein: 7.5 g/dL (ref 6.5–8.1)

## 2023-07-23 LAB — CBC WITH DIFFERENTIAL (CANCER CENTER ONLY)
Abs Immature Granulocytes: 0.06 10*3/uL (ref 0.00–0.07)
Basophils Absolute: 0.1 10*3/uL (ref 0.0–0.1)
Basophils Relative: 1 %
Eosinophils Absolute: 0.2 10*3/uL (ref 0.0–0.5)
Eosinophils Relative: 3 %
HCT: 41.9 % (ref 39.0–52.0)
Hemoglobin: 14 g/dL (ref 13.0–17.0)
Immature Granulocytes: 1 %
Lymphocytes Relative: 20 %
Lymphs Abs: 1.3 10*3/uL (ref 0.7–4.0)
MCH: 33.9 pg (ref 26.0–34.0)
MCHC: 33.4 g/dL (ref 30.0–36.0)
MCV: 101.5 fL — ABNORMAL HIGH (ref 80.0–100.0)
Monocytes Absolute: 1 10*3/uL (ref 0.1–1.0)
Monocytes Relative: 15 %
Neutro Abs: 3.9 10*3/uL (ref 1.7–7.7)
Neutrophils Relative %: 60 %
Platelet Count: 209 10*3/uL (ref 150–400)
RBC: 4.13 MIL/uL — ABNORMAL LOW (ref 4.22–5.81)
RDW: 14.5 % (ref 11.5–15.5)
WBC Count: 6.4 10*3/uL (ref 4.0–10.5)
nRBC: 0 % (ref 0.0–0.2)

## 2023-07-23 LAB — URINALYSIS, DIPSTICK ONLY
Bilirubin Urine: NEGATIVE
Glucose, UA: NEGATIVE mg/dL
Hgb urine dipstick: NEGATIVE
Ketones, ur: NEGATIVE mg/dL
Leukocytes,Ua: NEGATIVE
Nitrite: NEGATIVE
Protein, ur: NEGATIVE mg/dL
Specific Gravity, Urine: 1.013 (ref 1.005–1.030)
pH: 5 (ref 5.0–8.0)

## 2023-07-23 LAB — PROTEIN, URINE, RANDOM: Total Protein, Urine: 7 mg/dL

## 2023-07-23 MED ORDER — HEPARIN SOD (PORK) LOCK FLUSH 100 UNIT/ML IV SOLN
500.0000 [IU] | Freq: Once | INTRAVENOUS | Status: AC
  Administered 2023-07-23: 500 [IU] via INTRAVENOUS
  Filled 2023-07-23: qty 5

## 2023-07-23 NOTE — Progress Notes (Signed)
[facility] CONSULT NOTE  Patient Care Team: Pcp, No as PCP - General [name], RN as Oncology Nurse Navigator [name], MD as Consulting Physician (Internal Medicine)  CHIEF COMPLAINTS/PURPOSE OF CONSULTATION: Lung cancer  #  Oncology History Overview Note  IMPRESSION: 1. Patchy nodular fat stranding throughout the anterior left upper quadrant peritoneal fat, nonspecific, cannot exclude peritoneal carcinomatosis. Dedicated CT abdomen/pelvis with oral and IV contrast recommended for further evaluation. 2. Dense patchy consolidation replacing much of the left lower lung lobe, appearing masslike in the superior segment left lower lobe, with associated bulging of the left major fissure and associated left lower lobe volume loss. Fine nodularity throughout both lungs with an upper lobe predominance. Asymmetric left upper lobe interlobular septal thickening. These findings are indeterminate, with differential including multilobar pneumonia, sarcoidosis or a neoplastic process. The persistence on radiographs back to 01/20/2021 despite antibiotic therapy make sarcoidosis or a neoplastic process more likely. Pulmonology consultation suggested for consideration of bronchoscopic evaluation. 3. Small dependent left pleural effusion. 4. Mild mediastinal lymphadenopathy, nonspecific. 5. Subacute healing lateral right sixth rib fracture.   DIAGNOSIS:  A. LUNG, LEFT LOWER LOBE; ENB-ASSISTED BIOPSY:  - NON-SMALL CELL CARCINOMA, FAVOR ADENOCARCINOMA.  - FOREIGN MATERIAL SUGGESTIVE OF ASPIRATION.   # EGFR MUTATED: 63 del- June 28th, 2022- [name]. [limited]  # Asymptomatic multiple brain mets subcentimeter asymptomatic -JUNE 29th, 2023- Numerous metastatic lesions in the supratentorial brain-increase in number also conspicuity compared to January 2023.  Pre-SBRT MRI brain AUG 18th, 2023-  Eighteen small enhancing brain metastases (punctate to 11 mm); were all  present on 03/28/2021-s/p SBRT SEP 2023- [[place]]; SRS/ radiation treatment for brain metastasis He finished treatment on 11-26-22.   # SEP-OCT-worsening left-sided pleural effusion-status post paracentesis; exudative; Cytology negative- ?  Progressive disease.  # NOV 2023- JAN 2024 CARBO-TAXOL-BEV+ATEZO x4 cyles- BEV+ATEZO- until APRIL 2024  # STAGE IV- [JUNE 2022] Left lower lobe lung cancer-adenocarcinoma the lung;  EGFR- 19 del POSITIVE.  While on osimertinib- NOV 1st, 2023- PET scan- shows progressive disease in liver. Liquid Biopsy/Guardant testing-positive for EGFR mutation; otherwise Negative for any novel mutations.  Repeat liver Biopsy positive for adenocarcinoma. NEGATIVE for MET amplification.  S/p second opinion at [facility]- Dr.Stinchcomb. status post 4 cycles of carbo Taxol-Atezo+Bev- JAN 25th, 2024-PET- Interval response to therapy with resolution of previously demonstrated extensive hypermetabolic activity throughout the liver. PARIL 2024- ? Partial response, but clinically worse/rising LFTs  # MAY 6th, Brain MRI-progressive multiple brain metastases  # MAY 16th, 2024- Alimta+Avastin #1    Cancer of lower lobe of left lung (HCC)  03/23/2021 Initial Diagnosis   Cancer of lower lobe of left lung (HCC)   03/29/2021 Cancer Staging   Staging form: Lung, AJCC 8th Edition - Clinical: Stage IVB (cT3, cN3, pM1c) - Signed by [name], MD on 03/29/2021   03/30/2021 - 03/30/2021 Chemotherapy   Patient is on Treatment Plan : LUNG NSCLC Pemetrexed (Alimta) / Carboplatin q21d x 1 cycles     08/17/2022 - 01/15/2023 Chemotherapy   Patient is on Treatment Plan : LUNG Atezolizumab + Bevacizumab + Carboplatin + Paclitaxel q21d Induction x 4 cycles / Atezolizumab + Bevacizumab q21d Maintenance     02/28/2023 -  Chemotherapy   Patient is on Treatment Plan : LUNG NON-SMALL CELL Bevacizumab + Pemetrexed q21d      HISTORY OF PRESENTING ILLNESS: Ambulating independently.  Accompanied by his  wife.  [name] 42 y.o.  male patient with stage IV lung cancer adenocarcinoma-brain  mets [synchronous] bone mets EGFR mutated -currently on Alimta- Bev is here for follow-up. Patient currently on Alimta+ M-vasi  appx 3 weeks ago; is here to review the results of PET scan..   Patient states that he has distended belly. Has a good appetite but belly gets tight so he is eating less. Has hiatal hernia . Bowels normal. Intermittent nausea and vomiting. No headaches or dizzines   Intermittent nausea and vomiting. Positive for weight loss. Intermittent wheezing. No shortness of breath.   No worsening headaches. Currently on  Eliquis 2.5mg  bid due to bleeding from his nose.  No worsening skin rash.   Review of Systems  Constitutional:  Positive for malaise/fatigue. Negative for chills, diaphoresis and fever.  HENT:  Negative for nosebleeds and sore throat.   Eyes:  Negative for double vision.  Respiratory:  Positive for cough. Negative for wheezing.   Cardiovascular:  Negative for chest pain, palpitations, orthopnea and leg swelling.  Gastrointestinal:  Positive for heartburn and nausea. Negative for abdominal pain, blood in stool, diarrhea and melena.  Genitourinary:  Negative for dysuria, frequency and urgency.  Musculoskeletal:  Negative for back pain and joint pain.  Skin:  Negative for itching.  Neurological:  Negative for tingling and focal weakness.  Endo/Heme/Allergies:  Does not bruise/bleed easily.  Psychiatric/Behavioral:  Negative for depression. The patient is not nervous/anxious and does not have insomnia.      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    SURGICAL HISTORY: Past Surgical History:  Procedure Laterality Date   FRACTURE SURGERY     broke femur when he was 6 years   PORTA CATH INSERTION N/A 03/28/2021   Procedure: PORTA CATH  INSERTION;  Surgeon: Renford Dills, MD;  Location: [facility] INVASIVE CV LAB;  Service: Cardiovascular;  Laterality: N/A;   VIDEO BRONCHOSCOPY WITH ENDOBRONCHIAL NAVIGATION N/A 03/15/2021   Procedure: VIDEO BRONCHOSCOPY WITH ENDOBRONCHIAL NAVIGATION;  Surgeon: Vida Rigger, MD;  Location: [facility] ORS;  Service: Thoracic;  Laterality: N/A;   VIDEO BRONCHOSCOPY WITH ENDOBRONCHIAL ULTRASOUND N/A 03/15/2021   Procedure: VIDEO BRONCHOSCOPY WITH ENDOBRONCHIAL ULTRASOUND;  Surgeon: Vida Rigger, MD;  Location: [facility] ORS;  Service: Thoracic;  Laterality: N/A;    SOCIAL HISTORY: Social History   Socioeconomic History   Marital status: Married    Spouse name: Darl Pikes   Number of children: 2   Years of education: Not on file   Highest education level: Not on file  Occupational History   Not on file  Tobacco Use   Smoking status: Never   Smokeless tobacco: Former    Types: Snuff, Chew  Vaping Use   Vaping status: Never Used  Substance and Sexual Activity   Alcohol use: Never   Drug use: Never   Sexual activity: Yes  Other Topics Concern   Not on file  Social History Narrative   [place]; with wife; 2 daughters[12 and 22]; never smoked; rare alcohol. 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 Dog and cat.   Social Determinants of Health   Financial Resource Strain: Low Risk  (05/10/2022)   Overall Financial Resource Strain (CARDIA)    Difficulty of Paying Living Expenses: Not hard at all  Food Insecurity: No Food Insecurity (05/10/2022)   Hunger Vital Sign    Worried About Running Out of Food in the Last Year: Never true  Ran Out of Food in the Last Year: Never true  Transportation Needs: No Transportation Needs (03/16/2022)   PRAPARE - Administrator, Civil Service (Medical): No    Lack of Transportation (Non-Medical): No  Physical Activity: Not on file  Stress: Not on file  Social Connections: Socially Integrated (05/10/2022)   Social Connection and Isolation Panel [NHANES]    Frequency of  Communication with Friends and Family: More than three times a week    Frequency of Social Gatherings with Friends and Family: More than three times a week    Attends Religious Services: More than 4 times per year    Active Member of [employer] or Organizations: Yes    Attends Engineer, structural: More than 4 times per year    Marital Status: Married  Catering manager Violence: Not on file    FAMILY HISTORY: Family History  Problem Relation Age of Onset   High Cholesterol Mother    Hypertension Father    Lung cancer Father    Skin cancer Father     ALLERGIES:  is allergic to codeine.  MEDICATIONS:  Current Outpatient Medications  Medication Sig Dispense Refill   albuterol (VENTOLIN HFA) 108 (90 Base) MCG/ACT inhaler Inhale 2 puffs into the lungs every 6 (six) hours as needed for wheezing or shortness of breath. 8 g 2   apixaban (ELIQUIS) 2.5 MG TABS tablet Take 1 tablet (2.5 mg total) by mouth 2 (two) times daily. 60 tablet 4   calcium carbonate (TUMS EX) 750 MG chewable tablet Chew 1 tablet by mouth daily.     folic acid (FOLVITE) 1 MG tablet TAKE 1 TABLET BY MOUTH EVERY DAY 90 tablet 1   lidocaine-prilocaine (EMLA) cream Apply 1 Application topically as needed. 30 g 0   metoprolol succinate (TOPROL-XL) 50 MG 24 hr tablet Take 50 mg by mouth daily. Take with or immediately following a meal.     OLANZapine (ZYPREXA) 5 MG tablet Take 1 tablet (5 mg total) by mouth at bedtime as needed (nausea). 30 tablet 0   ondansetron (ZOFRAN-ODT) 4 MG disintegrating tablet Take 1 tablet (4 mg total) by mouth every 8 (eight) hours as needed for nausea or vomiting. 20 tablet 1   sertraline (ZOLOFT) 50 MG tablet Take 1 tablet (50 mg total) by mouth daily. 30 tablet 1   traMADol (ULTRAM) 50 MG tablet Take 1 tablet (50 mg total) by mouth every 12 (twelve) hours as needed. 30 tablet 0   Vitamin D, Ergocalciferol, (DRISDOL) 1.25 MG (50000 UNIT) CAPS capsule TAKE 1 CAPSULE BY MOUTH ONE TIME PER WEEK  12 capsule 1   montelukast (SINGULAIR) 10 MG tablet Take 1 tablet (10 mg total) by mouth at bedtime. (Patient not taking: Reported on 07/23/2023) 30 tablet 0   No current facility-administered medications for this visit.   Facility-Administered Medications Ordered in Other Visits  Medication Dose Route Frequency Provider Last Rate Last Admin   heparin lock flush 100 UNIT/ML injection            heparin lock flush 100 UNIT/ML injection            sodium chloride flush (NS) 0.9 % injection 10 mL  10 mL Intravenous PRN [name], MD   10 mL at 07/28/21 0852      .  PHYSICAL EXAMINATION: ECOG PERFORMANCE STATUS: 1 - Symptomatic but completely ambulatory  Vitals:   07/23/23 1527  BP: 122/84  Pulse: 66  Resp: 18  Temp: (!)  97 F (36.1 C)  SpO2: 98%    Filed Weights   07/23/23 1527  Weight: 228 lb 12.8 oz (103.8 kg)      Physical Exam HENT:     Head: Normocephalic and atraumatic.     Mouth/Throat:     Pharynx: No oropharyngeal exudate.  Eyes:     Pupils: Pupils are equal, round, and reactive to light.  Cardiovascular:     Rate and Rhythm: Normal rate and regular rhythm.  Pulmonary:     Effort: No respiratory distress.     Breath sounds: No wheezing.     Comments: Decreased breath sound on the left side compared to right. Abdominal:     General: Bowel sounds are normal. There is no distension.     Palpations: Abdomen is soft. There is no mass.     Tenderness: There is no abdominal tenderness. There is no guarding or rebound.  Musculoskeletal:        General: No tenderness. Normal range of motion.     Cervical back: Normal range of motion and neck supple.  Skin:    General: Skin is warm.  Neurological:     Mental Status: He is alert and oriented to person, place, and time.  Psychiatric:        Mood and Affect: Affect normal.    Fungal dermatitis bilateral buttocks groin/underneath abdominal pannus  LABORATORY DATA:  I have reviewed the data as  listed Lab Results  Component Value Date   WBC 6.4 07/23/2023   HGB 14.0 07/23/2023   HCT 41.9 07/23/2023   MCV 101.5 (H) 07/23/2023   PLT 209 07/23/2023   Recent Labs    06/20/23 1300 07/02/23 0814 07/23/23 1446  NA 135 135 136  K 4.1 3.9 3.0*  CL 101 103 101  CO2 26 26 27   GLUCOSE 104* 92 74  BUN 7 9 9   CREATININE 0.77 0.89 0.83  CALCIUM 8.9 8.7* 8.6*  GFRNONAA >60 >60 >60  PROT 7.0 7.0 7.5  ALBUMIN 3.3* 3.5 3.4*  AST 95* 140* 125*  ALT 65* 83* 77*  ALKPHOS 194* 261* 288*  BILITOT 1.1 0.6 1.0    RADIOGRAPHIC STUDIES: I have personally reviewed the radiological images as listed and agreed with the findings in the report. NM PET Image Restage (PS) Skull Base to Thigh (F-18 FDG)  Result Date: 07/17/2023 CLINICAL DATA:  Subsequent treatment strategy for recurrent stage IV non-small cell left lower lobe lung cancer. EXAM: NUCLEAR MEDICINE PET SKULL BASE TO THIGH TECHNIQUE: 11.9 mCi F-18 FDG was injected intravenously. Full-ring PET imaging was performed from the skull base to thigh after the radiotracer. CT data was obtained and used for attenuation correction and anatomic localization. Fasting blood glucose: 93 mg/dl COMPARISON:  16/07/9603 PET-CT.  05/27/2023 CT abdomen/pelvis. FINDINGS: Mediastinal blood pool activity: SUV max 2.6 Liver activity: SUV max NA NECK: New hypermetabolic bilateral level 2 neck lymph nodes, largest 0.7 cm on the right with max SUV 5.9 (series 6/image 28). Incidental CT findings: Right internal jugular Port-A-Cath terminates at the cavoatrial junction. CHEST: Mild patchy hypermetabolism associated with bandlike chronic consolidation in the left lower lobe with max SUV 5.8, previous max SUV 5.0, not substantially changed favoring inflammatory uptake. No new focal hypermetabolic pulmonary nodules. No enlarged or hypermetabolic mediastinal, hilar or axillary lymph nodes. Incidental CT findings: Chronic loculated large peripheral left pleural effusion with  smooth left pleural thickening and associated prominent left lung volume loss, unchanged. Interlobular septal thickening throughout the right lung  is mildly increased. Stable tiny 0.3 cm apical right upper lobe nodule, below PET resolution (series 6/image 52). ABDOMEN/PELVIS: Widespread poorly marginated hypermetabolic hypodense liver masses, increased in size and metabolism. Representative segment 4 left liver 3.5 x 3.1 cm mass with max SUV 9.9 (series 6/image 87), previously 2.6 x 2.5 cm with max SUV 5.6. Representative posterior right liver dome 6.0 x 2.6 cm mass with max SUV 7.7 (series 6/image 78), previously 5.3 x 2.2 cm with max SUV 5.7. No abnormal hypermetabolic activity within the pancreas, adrenal glands, or spleen. No hypermetabolic lymph nodes in the abdomen or pelvis. Incidental CT findings: Small hiatal hernia. Small volume ascites is new. SKELETON: New hypermetabolic lytic posterior T3 vertebral 1.0 cm lesion with max SUV 5.3 (series 6/image 50). No additional focal hypermetabolic skeletal lesions. Incidental CT findings: None. IMPRESSION: 1. Widespread hypermetabolic poorly marginated bilobar liver metastases, increased in size and metabolism since 04/22/2023 PET-CT. 2. New hypermetabolic lytic posterior T3 vertebral metastasis. 3. New hypermetabolic subcentimeter bilateral level 2 neck lymph nodes, indeterminate for reactive versus metastatic nodes. 4. Interlobular septal thickening throughout the right lung is mildly increased, cannot exclude lymphangitic carcinomatosis. 5. Stable mild patchy hypermetabolism associated with bandlike chronic consolidation in the left lower lobe, favoring inflammatory uptake. Chronic loculated large left pleural effusion with smooth left pleural thickening. 6. New small volume ascites. 7. Small hiatal hernia. Electronically Signed   By: Delbert Phenix M.D.   On: 07/17/2023 12:57    ASSESSMENT & PLAN:   Cancer of lower lobe of left lung (HCC) # STAGE IV- [JUNE  2022] Left lower lobe lung cancer-adenocarcinoma the lung;  EGFR- 19 del POSITIVE. s /p second opinion at [facility]- Dr.Stinchcomb.  Since MAY 2024- Alimta+ M-vasi  s/p 7- cyles-   OCT 1st, 2024- . Widespread hypermetabolic poorly marginated bilobar liver metastases, increased in size and metabolism since 04/22/2023 PET-CT; New hypermetabolic lytic posterior T3 vertebral metastasis;  New hypermetabolic subcentimeter bilateral level 2 neck lymph nodes, indeterminate for reactive versus metastatic nodes.Interlobular septal thickening throughout the right lung is mildly increased, cannot exclude lymphangitic carcinomatosis. Stable mild patchy hypermetabolism associated with bandlike chronic consolidation in the left lower lobe, favoring inflammatory uptake. Chronic loculated large left pleural effusion with smooth left pleural thickening;  New small volume ascites.   # Given the facility discussed regarding discontinuation of current chemotherapy Alimta with Avastin. Recommend Carboplatin-Alimta-Amivatimab [cycle #1 -ami given days 1 and 2; and then 8 and 15]; thereafter every 3 weeks.  I reviewed the potential side effects including but not due to nausea vomiting diarrhea skin rash infusion reactions risk of blood clots.  Discussed with pharmacy.  Will check with insurance.  # Abdominal discomfort ongoing nausea elevated LFTs: Normal bilirubin-likely secondary progressive disease based on PET scan.-Consider Zyprexa.  # Nose bleeds: multifactorial- avastin+ platelets-currently on low-dose of Eliquis given thrombocytopenia from chemotherapy.   continue Eliquis at low-dose of 2.5 mg BID stable.   # Eye burning/tearing- on Patanol eye drop BID- stable.   #Asymptomatic multiple brain mets subcentimeter asymptomatic-  -s/p SBRT SEP 2023- [[place]].  S/p SBRT [[place]]- FEB 12th, 2024. July 9th, 2024-  Stable clinically. Decreased conspicuity of all enhancing lesions with some lesions described on the prior no longer visible  and others less visible. No new lesions identified  S/p evaluation with neuro-oncology- stable. Discussed with Dr.Vaslow. will repeat imaging in OCT, 2024-clinically stable.  # bone metastases-Hypocalcemia: mild- continue calcium 1200 plus vitamin D-3 1000-over-the-counter-1 pill a day.  APRIL 2024-vit D 25-OH-28- on  Ergocalciferol progressive disease as above.  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 Continue dose of Eliquis 2.5 mg BID.[given nose bleeds]- see above.stable.   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   # IV access/ port malfunction-s/p tPA.   [Tuesday/wed]- pref B12  on 8/27  # DISPOSITION:  # HOLD chemo today # de-access  # follow up TBD-  Dr.B  # 40 minutes face-to-face with the patient discussing the above plan of care; more than 50% of time spent on prognosis/ natural history; counseling and coordination.    All questions were answered. The patient knows to call the clinic with any problems, questions or concerns.    [name], MD 07/23/2023 4:59 PM

## 2023-07-23 NOTE — Assessment & Plan Note (Addendum)
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 s /p second opinion at [facility]- Dr.Stinchcomb.  Since MAY 2024- Alimta+ M-vasi  s/p 7- cyles-   OCT 1st, 2024- . Widespread hypermetabolic poorly marginated bilobar liver metastases, increased in size and metabolism since 04/22/2023 PET-CT; New hypermetabolic lytic posterior T3 vertebral metastasis;  New hypermetabolic subcentimeter bilateral level 2 neck lymph nodes, indeterminate for reactive versus metastatic nodes.Interlobular septal thickening throughout the right lung is mildly increased, cannot exclude lymphangitic carcinomatosis. Stable mild patchy hypermetabolism associated with bandlike chronic consolidation in the left lower lobe, favoring inflammatory uptake. Chronic loculated large left pleural effusion with smooth left pleural thickening;  New small volume ascites.   # Given the facility discussed regarding discontinuation of current chemotherapy Alimta with Avastin. Recommend Carboplatin-Alimta-Amivatimab [cycle #1 -ami given days 1 and 2; and then 8 and 15]; thereafter every 3 weeks.  I reviewed the potential side effects including but not due to nausea vomiting diarrhea skin rash infusion reactions risk of blood clots.  Discussed with pharmacy.  Will check with insurance.  # Abdominal discomfort ongoing nausea elevated LFTs: Normal bilirubin-likely secondary progressive disease based on PET scan.-Consider Zyprexa.  # Nose bleeds: multifactorial- avastin+ platelets-currently on low-dose of Eliquis given thrombocytopenia from chemotherapy.   continue Eliquis at low-dose of 2.5 mg BID stable.   # Eye burning/tearing- on Patanol eye drop BID- stable.   #Asymptomatic multiple brain mets subcentimeter asymptomatic-  -s/p SBRT SEP 2023- [[place]].  S/p SBRT [[place]]- FEB 12th, 2024. July 9th, 2024-  Stable clinically. Decreased conspicuity of all enhancing lesions with some lesions described on the prior  no longer visible and others less visible. No new lesions identified  S/p evaluation with neuro-oncology- stable. Discussed with Dr.Vaslow. will repeat imaging in OCT, 2024-clinically stable.  # bone metastases-Hypocalcemia: mild- continue calcium 1200 plus vitamin D-3 1000-over-the-counter-1 pill a day.  APRIL 2024-vit D 25-OH-28- on Ergocalciferol progressive disease as above.  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 Continue dose of Eliquis 2.5 mg BID.[given nose bleeds]- see above.stable.   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   # IV access/ port malfunction-s/p tPA.   [Tuesday/wed]- pref B12  on 8/27  # DISPOSITION:  # HOLD chemo today # de-access  # follow up TBD-  Dr.B  # 40 minutes face-to-face with the patient discussing the above plan of care; more than 50% of time spent on prognosis/ natural history; counseling and coordination.

## 2023-07-23 NOTE — Progress Notes (Signed)
Pt has distended belly. Has a good appetite but belly gets tight so he is eating less. Has hiatal hernia . Bowels normal. Intermittent nausea and vomiting. No headaches or dizziness.

## 2023-07-23 NOTE — Progress Notes (Signed)
PET 07/16/23.

## 2023-07-23 NOTE — Progress Notes (Signed)
DISCONTINUE OFF PATHWAY REGIMEN - Non-Small Cell Lung   Custom Intervention:Medical: [Pemetrexed]:     Pemetrexed   **Always confirm dose/schedule in your pharmacy ordering system**  REASON: Toxicities / Adverse Event PRIOR TREATMENT: Off Pathway: Medical: [Pemetrexed] TREATMENT RESPONSE: Progressive Disease (PD)  START OFF PATHWAY REGIMEN - Non-Small Cell Lung   [id number]:Amivantamab 1,750 mg IV D1 + Carboplatin AUC=5 IV D1 + Pemetrexed 500 mg/m2 IV D1 q21 Days:   Cycle 1: A cycle is 21 days:     Pemetrexed      Carboplatin      Amivantamab-vmjw      Amivantamab-vmjw      Amivantamab-vmjw    Cycle 2: A cycle is 21 days:     Pemetrexed      Carboplatin      Amivantamab-vmjw    Cycles 3 and and 4: A cycle is every 21 days:     Pemetrexed      Carboplatin      Amivantamab-vmjw    Cycles 5 and beyond: A cycle is every 21 days:     Pemetrexed      Amivantamab-vmjw   **Always confirm dose/schedule in your pharmacy ordering system**  Patient Characteristics: Stage IV Metastatic, Nonsquamous, Molecular Analysis Completed, Molecular Alteration Present and Targeted Therapy Exhausted OR KRAS G12C+ or HER2+ Present and No Prior Chemo/Immunotherapy OR No Alteration Present, Second Line -  Chemotherapy/Immunotherapy, PS = 2, Prior PD-1/PD-L1 Inhibitor and No Prior Platinum-Based Chemotherapy Therapeutic Status: Stage IV Metastatic Histology: Nonsquamous Cell Broad Molecular Profiling Status: Molecular Analysis Completed Molecular Analysis Results: Alteration Present and Targeted Therapy Exhausted ECOG Performance Status: 2 Chemotherapy/Immunotherapy Line of Therapy: Second Line Chemotherapy/Immunotherapy Immunotherapy Candidate Status: Not a Candidate for Immunotherapy Prior Immunotherapy Status: Prior PD-1/PD-L1 Inhibitor and No Prior Platinum-Based Chemotherapy Intent of Therapy: Non-Curative / Palliative Intent, Discussed with Patient

## 2023-07-24 LAB — CEA: CEA: 17.8 ng/mL — ABNORMAL HIGH (ref 0.0–4.7)

## 2023-07-25 ENCOUNTER — Other Ambulatory Visit: Payer: Self-pay | Admitting: Internal Medicine

## 2023-07-26 ENCOUNTER — Encounter: Payer: Self-pay | Admitting: Nurse Practitioner

## 2023-07-26 ENCOUNTER — Ambulatory Visit: Payer: Managed Care, Other (non HMO) | Admitting: Internal Medicine

## 2023-07-26 ENCOUNTER — Telehealth: Payer: Self-pay

## 2023-07-26 ENCOUNTER — Encounter: Payer: Self-pay | Admitting: Internal Medicine

## 2023-07-26 NOTE — Telephone Encounter (Signed)
Called to follow up patient. Pt states he has a follow up MRI 10/15 and will see Vaslow early November. "[place] is doing great, body not so much, mostly my liver". Starts new chemo next week.

## 2023-07-29 ENCOUNTER — Other Ambulatory Visit: Payer: Self-pay | Admitting: *Deleted

## 2023-07-29 ENCOUNTER — Other Ambulatory Visit: Payer: Self-pay | Admitting: Internal Medicine

## 2023-07-29 ENCOUNTER — Encounter: Payer: Self-pay | Admitting: Internal Medicine

## 2023-07-29 DIAGNOSIS — C3432 Malignant neoplasm of lower lobe, left bronchus or lung: Secondary | ICD-10-CM

## 2023-07-29 NOTE — Progress Notes (Signed)
Follow up on 10/22- MD; labs- cbc/cmp; CEA; port- d-1 chemo; Day-2 -chemo  # follow up 10/29- X-MD; labs- cbc/cmp; CEA; port- d-1 chemo; Day-2 -chemo- Dr.B

## 2023-07-30 ENCOUNTER — Encounter: Payer: Self-pay | Admitting: Internal Medicine

## 2023-07-30 ENCOUNTER — Encounter: Payer: Self-pay | Admitting: Nurse Practitioner

## 2023-07-30 ENCOUNTER — Ambulatory Visit
Admission: RE | Admit: 2023-07-30 | Discharge: 2023-07-30 | Disposition: A | Discharge status: Home / Self Care | Payer: Managed Care, Other (non HMO) | Source: Ambulatory Visit | Attending: Internal Medicine | Admitting: Internal Medicine

## 2023-07-30 DIAGNOSIS — C7931 Secondary malignant neoplasm of brain: Secondary | ICD-10-CM

## 2023-07-30 MED ORDER — GADOPICLENOL 0.5 MMOL/ML IV SOLN
10.0000 mL | Freq: Once | INTRAVENOUS | Status: AC | PRN
  Administered 2023-07-30: 10 mL via INTRAVENOUS

## 2023-07-30 MED ORDER — HEPARIN SOD (PORK) LOCK FLUSH 100 UNIT/ML IV SOLN: 500.0000 [IU] | Freq: Once | INTRAVENOUS | Status: DC

## 2023-07-30 MED ORDER — SODIUM CHLORIDE 0.9% FLUSH: 10.0000 mL | INTRAVENOUS | Status: DC | PRN

## 2023-07-30 NOTE — Progress Notes (Signed)
Pharmacist Chemotherapy Monitoring - Initial Assessment    Anticipated start date: 08/07/23   The following has been reviewed per standard work regarding the patient's treatment regimen: The patient's diagnosis, treatment plan and drug doses, and organ/hematologic function Lab orders and baseline tests specific to treatment regimen  The treatment plan start date, drug sequencing, and pre-medications Prior authorization status  Patient's documented medication list, including drug-drug interaction screen and prescriptions for anti-emetics and supportive care specific to the treatment regimen The drug concentrations, fluid compatibility, administration routes, and timing of the medications to be used The patient's access for treatment and lifetime cumulative dose history, if applicable  The patient's medication allergies and previous infusion related reactions, if applicable   Changes made to treatment plan:  N/A  Follow up needed:  N/A   Ebony Hail, Pharm.D., CPP 07/30/2023@4 :06 PM

## 2023-08-01 ENCOUNTER — Encounter: Payer: Self-pay | Admitting: Internal Medicine

## 2023-08-05 ENCOUNTER — Other Ambulatory Visit: Payer: Self-pay | Admitting: Internal Medicine

## 2023-08-05 ENCOUNTER — Inpatient Hospital Stay: Payer: Managed Care, Other (non HMO)

## 2023-08-06 ENCOUNTER — Encounter: Payer: Self-pay | Admitting: Internal Medicine

## 2023-08-06 ENCOUNTER — Inpatient Hospital Stay: Payer: Managed Care, Other (non HMO)

## 2023-08-06 ENCOUNTER — Inpatient Hospital Stay (HOSPITAL_BASED_OUTPATIENT_CLINIC_OR_DEPARTMENT_OTHER): Payer: Managed Care, Other (non HMO) | Admitting: Internal Medicine

## 2023-08-06 DIAGNOSIS — C3432 Malignant neoplasm of lower lobe, left bronchus or lung: Secondary | ICD-10-CM | POA: Diagnosis not present

## 2023-08-06 DIAGNOSIS — Z5112 Encounter for antineoplastic immunotherapy: Secondary | ICD-10-CM | POA: Diagnosis not present

## 2023-08-06 LAB — CBC WITH DIFFERENTIAL (CANCER CENTER ONLY)
Abs Immature Granulocytes: 0.07 10*3/uL (ref 0.00–0.07)
Basophils Absolute: 0.1 10*3/uL (ref 0.0–0.1)
Basophils Relative: 1 %
Eosinophils Absolute: 0.2 10*3/uL (ref 0.0–0.5)
Eosinophils Relative: 3 %
HCT: 45 % (ref 39.0–52.0)
Hemoglobin: 15 g/dL (ref 13.0–17.0)
Immature Granulocytes: 1 %
Lymphocytes Relative: 20 %
Lymphs Abs: 1.2 10*3/uL (ref 0.7–4.0)
MCH: 32.9 pg (ref 26.0–34.0)
MCHC: 33.3 g/dL (ref 30.0–36.0)
MCV: 98.7 fL (ref 80.0–100.0)
Monocytes Absolute: 0.9 10*3/uL (ref 0.1–1.0)
Monocytes Relative: 15 %
Neutro Abs: 3.4 10*3/uL (ref 1.7–7.7)
Neutrophils Relative %: 60 %
Platelet Count: 150 10*3/uL (ref 150–400)
RBC: 4.56 MIL/uL (ref 4.22–5.81)
RDW: 14.1 % (ref 11.5–15.5)
WBC Count: 5.8 10*3/uL (ref 4.0–10.5)
nRBC: 0 % (ref 0.0–0.2)

## 2023-08-06 LAB — PHOSPHORUS: Phosphorus: 3.4 mg/dL (ref 2.5–4.6)

## 2023-08-06 LAB — CMP (CANCER CENTER ONLY)
ALT: 90 U/L — ABNORMAL HIGH (ref 0–44)
AST: 152 U/L — ABNORMAL HIGH (ref 15–41)
Albumin: 3.1 g/dL — ABNORMAL LOW (ref 3.5–5.0)
Alkaline Phosphatase: 329 U/L — ABNORMAL HIGH (ref 38–126)
Anion gap: 7 (ref 5–15)
BUN: 7 mg/dL (ref 6–20)
CO2: 25 mmol/L (ref 22–32)
Calcium: 8.7 mg/dL — ABNORMAL LOW (ref 8.9–10.3)
Chloride: 103 mmol/L (ref 98–111)
Creatinine: 0.72 mg/dL (ref 0.61–1.24)
GFR, Estimated: >60 mL/min (ref >60)
Glucose, Bld: 103 mg/dL — ABNORMAL HIGH (ref 70–99)
Potassium: 3.7 mmol/L (ref 3.5–5.1)
Sodium: 135 mmol/L (ref 135–145)
Total Bilirubin: 1 mg/dL (ref 0.3–1.2)
Total Protein: 6.8 g/dL (ref 6.5–8.1)

## 2023-08-06 LAB — MAGNESIUM: Magnesium: 1.9 mg/dL (ref 1.7–2.4)

## 2023-08-06 NOTE — Assessment & Plan Note (Addendum)
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 s /p second opinion at [facility]- Dr.Stinchcomb.  Since MAY 2024- Alimta+ M-vasi  s/p 7- cyles-   OCT 1st, 2024- . Widespread hypermetabolic poorly marginated bilobar liver metastases, increased in size and metabolism since 04/22/2023 PET-CT; New hypermetabolic lytic posterior T3 vertebral metastasis;  New hypermetabolic subcentimeter bilateral level 2 neck lymph nodes, indeterminate for reactive versus metastatic nodes.Interlobular septal thickening throughout the right lung is mildly increased, cannot exclude lymphangitic carcinomatosis. Stable mild patchy hypermetabolism associated with bandlike chronic consolidation in the left lower lobe, favoring inflammatory uptake. Chronic loculated large left pleural effusion with smooth left pleural thickening;  New small volume ascites.   # Given the progression disucssed regarding discontinuation of current chemotherapy Alimta with Avastin. Recommend Carboplatin-Alimta-Amivatimab [cycle #1 -ami given days 1 and 2; and then 8 and 15]; thereafter every 3 weeks.  I reviewed the potential side effects including but not due to nausea vomiting diarrhea skin rash infusion reactions risk of blood clots.    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  # Nose bleeds: multifactorial- avastin+ platelets-currently on low-dose of Eliquis given thrombocytopenia from chemotherapy.   continue Eliquis at low-dose of 2.5 mg BID stable.   # Eye burning/tearing- on Patanol eye drop BID-stable.   #Asymptomatic multiple brain mets subcentimeter asymptomatic-  -s/p SBRT SEP 2023- [[place]].  S/p SBRT [[place]]- FEB 12th, 2024. July 9th, 2024-  Stable clinically. Decreased conspicuity of all enhancing lesions with some lesions described on the prior no longer visible and others less visible. No new  lesions identified  S/p evaluation with neuro-oncology- stable. Discussed with Dr.Vaslow. will repeat imaging in OCT, 2024-clinically stable.   # bone metastases-Hypocalcemia: mild- continue calcium 1200 plus vitamin D-3 1000-over-the-counter-1 pill a day.  APRIL 2024-vit D 25-OH-28- on Ergocalciferol progressive disease as above.  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 Continue dose of Eliquis 2.5 mg BID.[given nose bleeds]- see above.stable.   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   # IV access/ port malfunction-s/p tPA.   [Tuesday/wed]- pref B12  on 8/27  # DISPOSITION:  # as planned- days- tomorrow-1 &  Day-2 -chemo  # follow up as planned- on day-8; and 15- Dr.B

## 2023-08-06 NOTE — Progress Notes (Signed)
Pt states not eating much because of the abdominal swelling x3 weeks. He doesn't understand how he can weight 220lb. Having nausea and vomiting.  Port didn't work last time. They didn't do labs thru port. He was wanting to know if port is accessible.

## 2023-08-06 NOTE — Progress Notes (Signed)
[facility] CONSULT NOTE  Patient Care Team: Pcp, No as PCP - General [name], RN as Oncology Nurse Navigator [name], MD as Consulting Physician (Internal Medicine)  CHIEF COMPLAINTS/PURPOSE OF CONSULTATION: Lung cancer  #  Oncology History Overview Note  IMPRESSION: 1. Patchy nodular fat stranding throughout the anterior left upper quadrant peritoneal fat, nonspecific, cannot exclude peritoneal carcinomatosis. Dedicated CT abdomen/pelvis with oral and IV contrast recommended for further evaluation. 2. Dense patchy consolidation replacing much of the left lower lung lobe, appearing masslike in the superior segment left lower lobe, with associated bulging of the left major fissure and associated left lower lobe volume loss. Fine nodularity throughout both lungs with an upper lobe predominance. Asymmetric left upper lobe interlobular septal thickening. These findings are indeterminate, with differential including multilobar pneumonia, sarcoidosis or a neoplastic process. The persistence on radiographs back to 01/20/2021 despite antibiotic therapy make sarcoidosis or a neoplastic process more likely. Pulmonology consultation suggested for consideration of bronchoscopic evaluation. 3. Small dependent left pleural effusion. 4. Mild mediastinal lymphadenopathy, nonspecific. 5. Subacute healing lateral right sixth rib fracture.   DIAGNOSIS:  A. LUNG, LEFT LOWER LOBE; ENB-ASSISTED BIOPSY:  - NON-SMALL CELL CARCINOMA, FAVOR ADENOCARCINOMA.  - FOREIGN MATERIAL SUGGESTIVE OF ASPIRATION.   # EGFR MUTATED: 70 del- June 28th, 2022- [name]. [limited]  # Asymptomatic multiple brain mets subcentimeter asymptomatic -JUNE 29th, 2023- Numerous metastatic lesions in the supratentorial brain-increase in number also conspicuity compared to January 2023.  Pre-SBRT MRI brain AUG 18th, 2023-  Eighteen small enhancing brain metastases (punctate to 11 mm); were all  present on 03/28/2021-s/p SBRT SEP 2023- [[place]]; SRS/ radiation treatment for brain metastasis He finished treatment on 11-26-22.   # SEP-OCT-worsening left-sided pleural effusion-status post paracentesis; exudative; Cytology negative- ?  Progressive disease.  # NOV 2023- JAN 2024 CARBO-TAXOL-BEV+ATEZO x4 cyles- BEV+ATEZO- until APRIL 2024  # STAGE IV- [JUNE 2022] Left lower lobe lung cancer-adenocarcinoma the lung;  EGFR- 19 del POSITIVE.  While on osimertinib- NOV 1st, 2023- PET scan- shows progressive disease in liver. Liquid Biopsy/Guardant testing-positive for EGFR mutation; otherwise Negative for any novel mutations.  Repeat liver Biopsy positive for adenocarcinoma. NEGATIVE for MET amplification.  S/p second opinion at [facility]- Dr.Stinchcomb. status post 4 cycles of carbo Taxol-Atezo+Bev- JAN 25th, 2024-PET- Interval response to therapy with resolution of previously demonstrated extensive hypermetabolic activity throughout the liver. PARIL 2024- ? Partial response, but clinically worse/rising LFTs  # MAY 6th, Brain MRI-progressive multiple brain metastases  # MAY 16th, 2024- Alimta+Avastin #1    Cancer of lower lobe of left lung (HCC)  03/23/2021 Initial Diagnosis   Cancer of lower lobe of left lung (HCC)   03/29/2021 Cancer Staging   Staging form: Lung, AJCC 8th Edition - Clinical: Stage IVB (cT3, cN3, pM1c) - Signed by [name], MD on 03/29/2021   03/30/2021 - 03/30/2021 Chemotherapy   Patient is on Treatment Plan : LUNG NSCLC Pemetrexed (Alimta) / Carboplatin q21d x 1 cycles     08/17/2022 - 01/15/2023 Chemotherapy   Patient is on Treatment Plan : LUNG Atezolizumab + Bevacizumab + Carboplatin + Paclitaxel q21d Induction x 4 cycles / Atezolizumab + Bevacizumab q21d Maintenance     02/28/2023 - 07/02/2023 Chemotherapy   Patient is on Treatment Plan : LUNG NON-SMALL CELL Bevacizumab + Pemetrexed q21d     08/06/2023 -  Chemotherapy   Patient is on Treatment Plan : LUNG NSCLC  Pemetrexed + Carboplatin + Amivantamab q21d x 4 Cycles / Pemetrexed + Amivantamab  q21d      HISTORY OF PRESENTING ILLNESS: Ambulating independently.  Accompanied by his wife.  [name] 42 y.o.  male patient with stage IV lung cancer adenocarcinoma-brain mets [synchronous] bone mets EGFR mutated -most recently noted to have progression on Alimta- Bev is here for follow-up/and proceed with chemotherapy with carboplatin-Alimta- Ami starting tomorrow.   Pt states not eating much because of the abdominal swelling x3 weeks.  However he complains of weight gain and also abdominal distention.  Patient having nausea without any vomiting.    Port didn't work last time. They didn't do labs thru port. He was wanting to know if port is accessible.   Intermittent wheezing. No shortness of breath.    No worsening headaches. Currently on  Eliquis 2.5mg  bid due to bleeding from his nose.  No worsening skin rash.   Review of Systems  Constitutional:  Positive for malaise/fatigue. Negative for chills, diaphoresis and fever.  HENT:  Negative for nosebleeds and sore throat.   Eyes:  Negative for double vision.  Respiratory:  Positive for cough. Negative for wheezing.   Cardiovascular:  Negative for chest pain, palpitations, orthopnea and leg swelling.  Gastrointestinal:  Positive for heartburn and nausea. Negative for abdominal pain, blood in stool, diarrhea and melena.  Genitourinary:  Negative for dysuria, frequency and urgency.  Musculoskeletal:  Negative for back pain and joint pain.  Skin:  Negative for itching.  Neurological:  Negative for tingling and focal weakness.  Endo/Heme/Allergies:  Does not bruise/bleed easily.  Psychiatric/Behavioral:  Negative for depression. The patient is not nervous/anxious and does not have insomnia.      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    SURGICAL HISTORY: Past Surgical History:  Procedure Laterality Date   FRACTURE SURGERY     broke femur when he was 6 years   PORTA CATH INSERTION N/A 03/28/2021   Procedure: PORTA CATH INSERTION;  Surgeon: Renford Dills, MD;  Location: [facility] INVASIVE CV LAB;  Service: Cardiovascular;  Laterality: N/A;   VIDEO BRONCHOSCOPY WITH ENDOBRONCHIAL NAVIGATION N/A 03/15/2021   Procedure: VIDEO BRONCHOSCOPY WITH ENDOBRONCHIAL NAVIGATION;  Surgeon: Vida Rigger, MD;  Location: [facility] ORS;  Service: Thoracic;  Laterality: N/A;   VIDEO BRONCHOSCOPY WITH ENDOBRONCHIAL ULTRASOUND N/A 03/15/2021   Procedure: VIDEO BRONCHOSCOPY WITH ENDOBRONCHIAL ULTRASOUND;  Surgeon: Vida Rigger, MD;  Location: [facility] ORS;  Service: Thoracic;  Laterality: N/A;    SOCIAL HISTORY: Social History   Socioeconomic History   Marital status: Married    Spouse name: Darl Pikes   Number of children: 2   Years of education: Not on file   Highest education level: Not on file  Occupational History   Not on file  Tobacco Use   Smoking status: Never   Smokeless tobacco: Former    Types: Snuff, Chew  Vaping Use   Vaping status: Never Used  Substance and Sexual Activity   Alcohol use: Never   Drug use: Never   Sexual activity: Yes  Other Topics Concern   Not on file  Social History Narrative   [place] in [place]; with wife; 2 daughters[12 and 22]; never smoked; rare alcohol. 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 Dog and cat.   Social Determinants of Health   Financial Resource Strain: Low Risk  (05/10/2022)   Overall Financial Resource Strain (CARDIA)  Difficulty of Paying Living Expenses: Not hard at all  Food Insecurity: No Food Insecurity (05/10/2022)   Hunger Vital Sign    Worried About Running Out of Food in the Last Year: Never true    Ran Out of Food in the Last Year: Never true  Transportation Needs: No Transportation Needs (03/16/2022)   PRAPARE - Administrator, Civil Service (Medical): No    Lack  of Transportation (Non-Medical): No  Physical Activity: Not on file  Stress: Not on file  Social Connections: Socially Integrated (05/10/2022)   Social Connection and Isolation Panel [NHANES]    Frequency of Communication with Friends and Family: More than three times a week    Frequency of Social Gatherings with Friends and Family: More than three times a week    Attends Religious Services: More than 4 times per year    Active Member of [employer] or Organizations: Yes    Attends Engineer, structural: More than 4 times per year    Marital Status: Married  Catering manager Violence: Not on file    FAMILY HISTORY: Family History  Problem Relation Age of Onset   High Cholesterol Mother    Hypertension Father    Lung cancer Father    Skin cancer Father     ALLERGIES:  is allergic to codeine.  MEDICATIONS:  Current Outpatient Medications  Medication Sig Dispense Refill   albuterol (VENTOLIN HFA) 108 (90 Base) MCG/ACT inhaler Inhale 2 puffs into the lungs every 6 (six) hours as needed for wheezing or shortness of breath. 8 g 2   apixaban (ELIQUIS) 2.5 MG TABS tablet Take 1 tablet (2.5 mg total) by mouth 2 (two) times daily. 60 tablet 4   calcium carbonate (TUMS EX) 750 MG chewable tablet Chew 1 tablet by mouth daily.     folic acid (FOLVITE) 1 MG tablet TAKE 1 TABLET BY MOUTH EVERY DAY 90 tablet 1   lidocaine-prilocaine (EMLA) cream Apply 1 Application topically as needed. 30 g 0   metoprolol succinate (TOPROL-XL) 50 MG 24 hr tablet Take 50 mg by mouth daily. Take with or immediately following a meal.     montelukast (SINGULAIR) 10 MG tablet TAKE 1 TABLET BY MOUTH EVERYDAY AT BEDTIME 90 tablet 1   OLANZapine (ZYPREXA) 5 MG tablet Take 1 tablet (5 mg total) by mouth at bedtime as needed (nausea). 30 tablet 0   ondansetron (ZOFRAN-ODT) 4 MG disintegrating tablet Take 1 tablet (4 mg total) by mouth every 8 (eight) hours as needed for nausea or vomiting. 20 tablet 1   sertraline  (ZOLOFT) 50 MG tablet Take 1 tablet (50 mg total) by mouth daily. 30 tablet 1   traMADol (ULTRAM) 50 MG tablet Take 1 tablet (50 mg total) by mouth every 12 (twelve) hours as needed. 30 tablet 0   Vitamin D, Ergocalciferol, (DRISDOL) 1.25 MG (50000 UNIT) CAPS capsule TAKE 1 CAPSULE BY MOUTH ONE TIME PER WEEK 12 capsule 1   No current facility-administered medications for this visit.   Facility-Administered Medications Ordered in Other Visits  Medication Dose Route Frequency Provider Last Rate Last Admin   heparin lock flush 100 UNIT/ML injection            heparin lock flush 100 UNIT/ML injection            sodium chloride flush (NS) 0.9 % injection 10 mL  10 mL Intravenous PRN [name], MD   10 mL at 07/28/21 0852      .  PHYSICAL EXAMINATION: ECOG PERFORMANCE STATUS: 1 - Symptomatic but completely ambulatory  Vitals:   08/06/23 0843  BP: 106/68  Pulse: 67  Temp: (!) 96.7 F (35.9 C)  SpO2: 96%    Filed Weights   08/06/23 0843  Weight: 220 lb 12.8 oz (100.2 kg)      Physical Exam HENT:     Head: Normocephalic and atraumatic.     Mouth/Throat:     Pharynx: No oropharyngeal exudate.  Eyes:     Pupils: Pupils are equal, round, and reactive to light.  Cardiovascular:     Rate and Rhythm: Normal rate and regular rhythm.  Pulmonary:     Effort: No respiratory distress.     Breath sounds: No wheezing.     Comments: Decreased breath sound on the left side compared to right. Abdominal:     General: Bowel sounds are normal. There is no distension.     Palpations: Abdomen is soft. There is no mass.     Tenderness: There is no abdominal tenderness. There is no guarding or rebound.  Musculoskeletal:        General: No tenderness. Normal range of motion.     Cervical back: Normal range of motion and neck supple.  Skin:    General: Skin is warm.  Neurological:     Mental Status: He is alert and oriented to person, place, and time.  Psychiatric:        Mood  and Affect: Affect normal.    Fungal dermatitis bilateral buttocks groin/underneath abdominal pannus  LABORATORY DATA:  I have reviewed the data as listed Lab Results  Component Value Date   WBC 5.8 08/06/2023   HGB 15.0 08/06/2023   HCT 45.0 08/06/2023   MCV 98.7 08/06/2023   PLT 150 08/06/2023   Recent Labs    07/02/23 0814 07/23/23 1446 08/06/23 0832  NA 135 136 135  K 3.9 3.0* 3.7  CL 103 101 103  CO2 26 27 25   GLUCOSE 92 74 103*  BUN 9 9 7   CREATININE 0.89 0.83 0.72  CALCIUM 8.7* 8.6* 8.7*  GFRNONAA >60 >60 >60  PROT 7.0 7.5 6.8  ALBUMIN 3.5 3.4* 3.1*  AST 140* 125* 152*  ALT 83* 77* 90*  ALKPHOS 261* 288* 329*  BILITOT 0.6 1.0 1.0    RADIOGRAPHIC STUDIES: I have personally reviewed the radiological images as listed and agreed with the findings in the report. MR BRAIN W WO CONTRAST  Result Date: 07/30/2023 CLINICAL DATA:  42 year old male with metastatic lung cancer. Progression of numerous subcentimeter enhancing lesions in May, subsequently regressed in July. Immunotherapy. Progressive liver metastases on PET-CT earlier this month. Restaging. EXAM: MRI HEAD WITHOUT AND WITH CONTRAST TECHNIQUE: Multiplanar, multiecho pulse sequences of the brain and surrounding structures were obtained without and with intravenous contrast. CONTRAST:  10 mL Vueway COMPARISON:  04/23/2023 and earlier. FINDINGS: Brain: Overall black blood postcontrast imaging reveals no disease progression compared to 02/18/2023. Overall few are subcentimeter enhancing lesions since that time, and those which are visible are stable since May. Multiple small lesions in the right parietal and occipital lobes are no longer visible. Most other lesions have not enlarged. A range from punctate (left temporal lobe series 13, image 46) up to approximately 12 mm (medial anterior right temporal lobe image 47). Subtle increased conspicuity of the cerebellar folia on postcontrast black blood (especially series 13,  image 49), but this is not correlated with convincing leptomeningeal thickening on the other postcontrast sequences (series 15, image  14) and is felt to be artifact. Approximately a dozen lesions remain visible on series 13 (annotated), and not all of these are apparent on MPRAGE. Occasional associated T2 and FLAIR hyperintensity (left frontal operculum, middle frontal gyrus) is unchanged since May. Stable cerebral volume. No restricted diffusion to suggest acute infarction. No midline shift, mass effect, ventriculomegaly, extra-axial collection or acute intracranial hemorrhage. Cervicomedullary junction and pituitary are within normal limits. No new signal abnormality. Occasional post treatment hemosiderin of the lesions is stable. Vascular: Major intracranial vascular flow voids are stable. Dominant distal right vertebral artery. Skull and upper cervical spine: Negative visible cervical spine and spinal cord. Visualized bone marrow signal is within normal limits. Sinuses/Orbits: Stable, negative. Other: Mastoids remain clear. Visible internal auditory structures appear normal. IMPRESSION: Stable post treatment appearance of the brain since 02/18/2023: - approximately a dozen small enhancing metastases remain visible (series 13) ranging from punctate to 12 mm. No significant edema or mass effect. - no new metastasis identified; subtle increased conspicuity of cerebellum leptomeninges on only series 13 is felt to be artifact. Attention on follow-up. Electronically Signed   By: Odessa Fleming M.D.   On: 07/30/2023 10:54   NM PET Image Restage (PS) Skull Base to Thigh (F-18 FDG)  Result Date: 07/17/2023 CLINICAL DATA:  Subsequent treatment strategy for recurrent stage IV non-small cell left lower lobe lung cancer. EXAM: NUCLEAR MEDICINE PET SKULL BASE TO THIGH TECHNIQUE: 11.9 mCi F-18 FDG was injected intravenously. Full-ring PET imaging was performed from the skull base to thigh after the radiotracer. CT data was  obtained and used for attenuation correction and anatomic localization. Fasting blood glucose: 93 mg/dl COMPARISON:  52/84/1324 PET-CT.  05/27/2023 CT abdomen/pelvis. FINDINGS: Mediastinal blood pool activity: SUV max 2.6 Liver activity: SUV max NA NECK: New hypermetabolic bilateral level 2 neck lymph nodes, largest 0.7 cm on the right with max SUV 5.9 (series 6/image 28). Incidental CT findings: Right internal jugular Port-A-Cath terminates at the cavoatrial junction. CHEST: Mild patchy hypermetabolism associated with bandlike chronic consolidation in the left lower lobe with max SUV 5.8, previous max SUV 5.0, not substantially changed favoring inflammatory uptake. No new focal hypermetabolic pulmonary nodules. No enlarged or hypermetabolic mediastinal, hilar or axillary lymph nodes. Incidental CT findings: Chronic loculated large peripheral left pleural effusion with smooth left pleural thickening and associated prominent left lung volume loss, unchanged. Interlobular septal thickening throughout the right lung is mildly increased. Stable tiny 0.3 cm apical right upper lobe nodule, below PET resolution (series 6/image 52). ABDOMEN/PELVIS: Widespread poorly marginated hypermetabolic hypodense liver masses, increased in size and metabolism. Representative segment 4 left liver 3.5 x 3.1 cm mass with max SUV 9.9 (series 6/image 87), previously 2.6 x 2.5 cm with max SUV 5.6. Representative posterior right liver dome 6.0 x 2.6 cm mass with max SUV 7.7 (series 6/image 78), previously 5.3 x 2.2 cm with max SUV 5.7. No abnormal hypermetabolic activity within the pancreas, adrenal glands, or spleen. No hypermetabolic lymph nodes in the abdomen or pelvis. Incidental CT findings: Small hiatal hernia. Small volume ascites is new. SKELETON: New hypermetabolic lytic posterior T3 vertebral 1.0 cm lesion with max SUV 5.3 (series 6/image 50). No additional focal hypermetabolic skeletal lesions. Incidental CT findings: None.  IMPRESSION: 1. Widespread hypermetabolic poorly marginated bilobar liver metastases, increased in size and metabolism since 04/22/2023 PET-CT. 2. New hypermetabolic lytic posterior T3 vertebral metastasis. 3. New hypermetabolic subcentimeter bilateral level 2 neck lymph nodes, indeterminate for reactive versus metastatic nodes. 4. Interlobular septal thickening throughout  the right lung is mildly increased, cannot exclude lymphangitic carcinomatosis. 5. Stable mild patchy hypermetabolism associated with bandlike chronic consolidation in the left lower lobe, favoring inflammatory uptake. Chronic loculated large left pleural effusion with smooth left pleural thickening. 6. New small volume ascites. 7. Small hiatal hernia. Electronically Signed   By: Delbert Phenix M.D.   On: 07/17/2023 12:57    ASSESSMENT & PLAN:   Cancer of lower lobe of left lung (HCC) # STAGE IV- [JUNE 2022] Left lower lobe lung cancer-adenocarcinoma the lung;  EGFR- 19 del POSITIVE. s /p second opinion at [facility]- Dr.Stinchcomb.  Since MAY 2024- Alimta+ M-vasi  s/p 7- cyles-   OCT 1st, 2024- . Widespread hypermetabolic poorly marginated bilobar liver metastases, increased in size and metabolism since 04/22/2023 PET-CT; New hypermetabolic lytic posterior T3 vertebral metastasis;  New hypermetabolic subcentimeter bilateral level 2 neck lymph nodes, indeterminate for reactive versus metastatic nodes.Interlobular septal thickening throughout the right lung is mildly increased, cannot exclude lymphangitic carcinomatosis. Stable mild patchy hypermetabolism associated with bandlike chronic consolidation in the left lower lobe, favoring inflammatory uptake. Chronic loculated large left pleural effusion with smooth left pleural thickening;  New small volume ascites.   # Given the progression disucssed regarding discontinuation of current chemotherapy Alimta with Avastin. Recommend Carboplatin-Alimta-Amivatimab [cycle #1 -ami given days 1 and 2; and  then 8 and 15]; thereafter every 3 weeks.  I reviewed the potential side effects including but not due to nausea vomiting diarrhea skin rash infusion reactions risk of blood clots.    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  # Nose bleeds: multifactorial- avastin+ platelets-currently on low-dose of Eliquis given thrombocytopenia from chemotherapy.   continue Eliquis at low-dose of 2.5 mg BID stable.   # Eye burning/tearing- on Patanol eye drop BID-stable.   #Asymptomatic multiple brain mets subcentimeter asymptomatic-  -s/p SBRT SEP 2023- [[place]].  S/p SBRT [[place]]- FEB 12th, 2024. July 9th, 2024-  Stable clinically. Decreased conspicuity of all enhancing lesions with some lesions described on the prior no longer visible and others less visible. No new lesions identified  S/p evaluation with neuro-oncology- stable. Discussed with Dr.Vaslow. will repeat imaging in OCT, 2024-clinically stable.   # bone metastases-Hypocalcemia: mild- continue calcium 1200 plus vitamin D-3 1000-over-the-counter-1 pill a day.  APRIL 2024-vit D 25-OH-28- on Ergocalciferol progressive disease as above.  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 Continue dose of Eliquis 2.5 mg BID.[given nose bleeds]- see above.stable.   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   # IV access/ port malfunction-s/p tPA.   [Tuesday/wed]- pref B12  on 8/27  # DISPOSITION:  # as planned- days- tomorrow-1 &  Day-2 -chemo  # follow up as planned- on day-8; and 15- Dr.B     All questions were answered. The patient knows to call the clinic with any problems, questions or  concerns.    [name], MD 08/06/2023 5:05 PM

## 2023-08-07 ENCOUNTER — Encounter: Payer: Self-pay | Admitting: Internal Medicine

## 2023-08-07 ENCOUNTER — Other Ambulatory Visit: Payer: Self-pay | Admitting: Internal Medicine

## 2023-08-07 ENCOUNTER — Inpatient Hospital Stay: Payer: Managed Care, Other (non HMO)

## 2023-08-07 VITALS — BP 122/82 | HR 64 | Temp 96.0°F | Resp 18

## 2023-08-07 DIAGNOSIS — Z5112 Encounter for antineoplastic immunotherapy: Secondary | ICD-10-CM | POA: Diagnosis not present

## 2023-08-07 DIAGNOSIS — C3432 Malignant neoplasm of lower lobe, left bronchus or lung: Secondary | ICD-10-CM

## 2023-08-07 DIAGNOSIS — E538 Deficiency of other specified B group vitamins: Secondary | ICD-10-CM

## 2023-08-07 LAB — CEA: CEA: 22.2 ng/mL — ABNORMAL HIGH (ref 0.0–4.7)

## 2023-08-07 MED ORDER — SODIUM CHLORIDE 0.9 % IV SOLN
350.0000 mg | Freq: Once | INTRAVENOUS | Status: AC
  Administered 2023-08-07: 350 mg via INTRAVENOUS
  Filled 2023-08-07: qty 7

## 2023-08-07 MED ORDER — DEXAMETHASONE SODIUM PHOSPHATE 10 MG/ML IJ SOLN
10.0000 mg | Freq: Once | INTRAMUSCULAR | Status: AC
  Administered 2023-08-07: 10 mg via INTRAVENOUS
  Filled 2023-08-07: qty 1

## 2023-08-07 MED ORDER — CYANOCOBALAMIN 1000 MCG/ML IJ SOLN
1000.0000 ug | Freq: Once | INTRAMUSCULAR | Status: AC
  Administered 2023-08-07: 1000 ug via INTRAMUSCULAR
  Filled 2023-08-07: qty 1

## 2023-08-07 MED ORDER — SODIUM CHLORIDE 0.9 % IV SOLN
750.0000 mg | Freq: Once | INTRAVENOUS | Status: AC
  Administered 2023-08-07: 750 mg via INTRAVENOUS
  Filled 2023-08-07: qty 20.67

## 2023-08-07 MED ORDER — SODIUM CHLORIDE 0.9 % IV SOLN
Freq: Once | INTRAVENOUS | Status: AC
  Filled 2023-08-07: qty 250

## 2023-08-07 MED ORDER — ACETAMINOPHEN 325 MG PO TABS
650.0000 mg | ORAL_TABLET | Freq: Once | ORAL | Status: AC
  Administered 2023-08-07: 650 mg via ORAL
  Filled 2023-08-07: qty 2

## 2023-08-07 MED ORDER — HEPARIN SOD (PORK) LOCK FLUSH 100 UNIT/ML IV SOLN
500.0000 [IU] | Freq: Once | INTRAVENOUS | Status: AC | PRN
  Administered 2023-08-07: 500 [IU]
  Filled 2023-08-07: qty 5

## 2023-08-07 MED ORDER — FAMOTIDINE IN NACL 20-0.9 MG/50ML-% IV SOLN
20.0000 mg | Freq: Once | INTRAVENOUS | Status: AC
  Administered 2023-08-07: 20 mg via INTRAVENOUS
  Filled 2023-08-07: qty 50

## 2023-08-07 MED ORDER — MONTELUKAST SODIUM 10 MG PO TABS
10.0000 mg | ORAL_TABLET | Freq: Once | ORAL | Status: AC
  Administered 2023-08-07: 10 mg via ORAL
  Filled 2023-08-07: qty 1

## 2023-08-07 MED ORDER — DIPHENHYDRAMINE HCL 25 MG PO CAPS
50.0000 mg | ORAL_CAPSULE | Freq: Once | ORAL | Status: AC
  Administered 2023-08-07: 50 mg via ORAL
  Filled 2023-08-07: qty 2

## 2023-08-07 MED ORDER — SODIUM CHLORIDE 0.9 % IV SOLN
500.0000 mg/m2 | Freq: Once | INTRAVENOUS | Status: AC
  Administered 2023-08-07: 1100 mg via INTRAVENOUS
  Filled 2023-08-07: qty 40

## 2023-08-07 MED ORDER — PALONOSETRON HCL INJECTION 0.25 MG/5ML
0.2500 mg | Freq: Once | INTRAVENOUS | Status: AC
  Administered 2023-08-07: 0.25 mg via INTRAVENOUS
  Filled 2023-08-07: qty 5

## 2023-08-07 MED ORDER — SODIUM CHLORIDE 0.9 % IV SOLN
150.0000 mg | Freq: Once | INTRAVENOUS | Status: AC
  Administered 2023-08-07: 150 mg via INTRAVENOUS
  Filled 2023-08-07: qty 150

## 2023-08-07 NOTE — Patient Instructions (Signed)
[facility] [facility]  Discharge Instructions: Thank you for choosing [facility] to provide your oncology and hematology care.  If you have a lab appointment with the Cancer Center, please go directly to the Cancer Center and check in at the registration area.  Wear comfortable clothing and clothing appropriate for easy access to any Portacath or PICC line.   We strive to give you quality time with your provider. You may need to reschedule your appointment if you arrive late (15 or more minutes).  Arriving late affects you and other patients whose appointments are after yours.  Also, if you miss three or more appointments without notifying the office, you may be dismissed from the clinic at the provider's discretion.      For prescription refill requests, have your pharmacy contact our office and allow 72 hours for refills to be completed.    Today you received the following chemotherapy and/or immunotherapy agents ALIMTA, CARBOPLATIN and RYBREVANT   To help prevent nausea and vomiting after your treatment, we encourage you to take your nausea medication as directed.  BELOW ARE SYMPTOMS THAT SHOULD BE REPORTED IMMEDIATELY: *FEVER GREATER THAN 100.4 F (38 C) OR HIGHER *CHILLS OR SWEATING *NAUSEA AND VOMITING THAT IS NOT CONTROLLED WITH YOUR NAUSEA MEDICATION *UNUSUAL SHORTNESS OF BREATH *UNUSUAL BRUISING OR BLEEDING *URINARY PROBLEMS (pain or burning when urinating, or frequent urination) *BOWEL PROBLEMS (unusual diarrhea, constipation, pain near the anus) TENDERNESS IN MOUTH AND THROAT WITH OR WITHOUT PRESENCE OF ULCERS (sore throat, sores in mouth, or a toothache) UNUSUAL RASH, SWELLING OR PAIN  UNUSUAL VAGINAL DISCHARGE OR ITCHING   Items with * indicate a potential emergency and should be followed up as soon as possible or go to the Emergency Department if any problems should occur.  Please show the CHEMOTHERAPY ALERT CARD or IMMUNOTHERAPY ALERT  CARD at check-in to the Emergency Department and triage nurse.  Should you have questions after your visit or need to cancel or reschedule your appointment, please contact [place] [facility]  [phone] and follow the prompts.  Office hours are 8:00 a.m. to 4:30 p.m. Monday - Friday. Please note that voicemails left after 4:00 p.m. may not be returned until the following business day.  We are closed weekends and major holidays. You have access to a nurse at all times for urgent questions. Please call the main number to the clinic [phone] and follow the prompts.  For any non-urgent questions, you may also contact your provider using MyChart. We now offer e-Visits for anyone 4 and older to request care online for non-urgent symptoms. For details visit [email or web address].   Also download the MyChart app! Go to the app store, search "MyChart", open the app, select [place], and log in with your MyChart username and password.  Pemetrexed Injection What is this medication? PEMETREXED (PEM e TREX ed) treats some types of cancer. It works by slowing down the growth of cancer cells. This medicine may be used for other purposes; ask your health care provider or pharmacist if you have questions. COMMON BRAND NAME(S): Alimta, PEMFEXY, PEMRYDI RTU What should I tell my care team before I take this medication? They need to know if you have any of these conditions: Infection, such as chickenpox, cold sores, or herpes Kidney disease Low blood cell levels (white cells, red cells, and platelets) Lung or breathing disease, such as asthma Radiation therapy An unusual or allergic reaction to pemetrexed, other medications, foods,  dyes, or preservatives If you or your partner are pregnant or trying to get pregnant Breast-feeding How should I use this medication? This medication is injected into a vein. It is given by your care team in a hospital or clinic setting. Talk to  your care team about the use of this medication in children. Special care may be needed. Overdosage: If you think you have taken too much of this medicine contact a poison control center or emergency room at once. NOTE: This medicine is only for you. Do not share this medicine with others. What if I miss a dose? Keep appointments for follow-up doses. It is important not to miss your dose. Call your care team if you are unable to keep an appointment. What may interact with this medication? Do not take this medication with any of the following: Live virus vaccines This medication may also interact with the following: Ibuprofen This list may not describe all possible interactions. Give your health care provider a list of all the medicines, herbs, non-prescription drugs, or dietary supplements you use. Also tell them if you smoke, drink alcohol, or use illegal drugs. Some items may interact with your medicine. What should I watch for while using this medication? Your condition will be monitored carefully while you are receiving this medication. This medication may make you feel generally unwell. This is not uncommon as chemotherapy can affect healthy cells as well as cancer cells. Report any side effects. Continue your course of treatment even though you feel ill unless your care team tells you to stop. This medication can cause serious side effects. To reduce the risk, your care team may give you other medications to take before receiving this one. Be sure to follow the directions from your care team. This medication can cause a rash or redness in areas of the body that have previously had radiation therapy. If you have had radiation therapy, tell your care team if you notice a rash in this area. This medication may increase your risk of getting an infection. Call your care team for advice if you get a fever, chills, sore throat, or other symptoms of a cold or flu. Do not treat yourself. Try to avoid  being around people who are sick. Be careful brushing or flossing your teeth or using a toothpick because you may get an infection or bleed more easily. If you have any dental work done, tell your dentist you are receiving this medication. Avoid taking medications that contain aspirin, acetaminophen, ibuprofen, naproxen, or ketoprofen unless instructed by your care team. These medications may hide a fever. Check with your care team if you have severe diarrhea, nausea, and vomiting, or if you sweat a lot. The loss of too much body fluid may make it dangerous for you to take this medication. Talk to your care team if you or your partner wish to become pregnant or think either of you might be pregnant. This medication can cause serious birth defects if taken during pregnancy and for 6 months after the last dose. A negative pregnancy test is required before starting this medication. A reliable form of contraception is recommended while taking this medication and for 6 months after the last dose. Talk to your care team about reliable forms of contraception. Do not father a child while taking this medication and for 3 months after the last dose. Use a condom while having sex during this time period. Do not breastfeed while taking this medication and for 1 week after  the last dose. This medication may cause infertility. Talk to your care team if you are concerned about your fertility. What side effects may I notice from receiving this medication? Side effects that you should report to your care team as soon as possible: Allergic reactions--skin rash, itching, hives, swelling of the face, lips, tongue, or throat Dry cough, shortness of breath or trouble breathing Infection--fever, chills, cough, sore throat, wounds that don't heal, pain or trouble when passing urine, general feeling of discomfort or being unwell Kidney injury--decrease in the amount of urine, swelling of the ankles, hands, or feet Low red blood  cell level--unusual weakness or fatigue, dizziness, headache, trouble breathing Redness, blistering, peeling, or loosening of the skin, including inside the mouth Unusual bruising or bleeding Side effects that usually do not require medical attention (report to your care team if they continue or are bothersome): Fatigue Loss of appetite Nausea Vomiting This list may not describe all possible side effects. Call your doctor for medical advice about side effects. You may report side effects to FDA at 1-800-FDA-1088. Where should I keep my medication? This medication is given in a hospital or clinic. It will not be stored at home. NOTE: This sheet is a summary. It may not cover all possible information. If you have questions about this medicine, talk to your doctor, pharmacist, or health care provider.  2024 Elsevier/Gold Standard (2022-02-06 00:00:00)  Carboplatin Injection What is this medication? CARBOPLATIN (KAR boe pla tin) treats some types of cancer. It works by slowing down the growth of cancer cells. This medicine may be used for other purposes; ask your health care provider or pharmacist if you have questions. COMMON BRAND NAME(S): Paraplatin What should I tell my care team before I take this medication? They need to know if you have any of these conditions: Blood disorders Hearing problems Kidney disease Recent or ongoing radiation therapy An unusual or allergic reaction to carboplatin, cisplatin, other medications, foods, dyes, or preservatives Pregnant or trying to get pregnant Breast-feeding How should I use this medication? This medication is injected into a vein. It is given by your care team in a hospital or clinic setting. Talk to your care team about the use of this medication in children. Special care may be needed. Overdosage: If you think you have taken too much of this medicine contact a poison control center or emergency room at once. NOTE: This medicine is only  for you. Do not share this medicine with others. What if I miss a dose? Keep appointments for follow-up doses. It is important not to miss your dose. Call your care team if you are unable to keep an appointment. What may interact with this medication? Medications for seizures Some antibiotics, such as amikacin, gentamicin, neomycin, streptomycin, tobramycin Vaccines This list may not describe all possible interactions. Give your health care provider a list of all the medicines, herbs, non-prescription drugs, or dietary supplements you use. Also tell them if you smoke, drink alcohol, or use illegal drugs. Some items may interact with your medicine. What should I watch for while using this medication? Your condition will be monitored carefully while you are receiving this medication. You may need blood work while taking this medication. This medication may make you feel generally unwell. This is not uncommon, as chemotherapy can affect healthy cells as well as cancer cells. Report any side effects. Continue your course of treatment even though you feel ill unless your care team tells you to stop. In some cases,  you may be given additional medications to help with side effects. Follow all directions for their use. This medication may increase your risk of getting an infection. Call your care team for advice if you get a fever, chills, sore throat, or other symptoms of a cold or flu. Do not treat yourself. Try to avoid being around people who are sick. Avoid taking medications that contain aspirin, acetaminophen, ibuprofen, naproxen, or ketoprofen unless instructed by your care team. These medications may hide a fever. Be careful brushing or flossing your teeth or using a toothpick because you may get an infection or bleed more easily. If you have any dental work done, tell your dentist you are receiving this medication. Talk to your care team if you wish to become pregnant or think you might be pregnant.  This medication can cause serious birth defects. Talk to your care team about effective forms of contraception. Do not breast-feed while taking this medication. What side effects may I notice from receiving this medication? Side effects that you should report to your care team as soon as possible: Allergic reactions--skin rash, itching, hives, swelling of the face, lips, tongue, or throat Infection--fever, chills, cough, sore throat, wounds that don't heal, pain or trouble when passing urine, general feeling of discomfort or being unwell Low red blood cell level--unusual weakness or fatigue, dizziness, headache, trouble breathing Pain, tingling, or numbness in the hands or feet, muscle weakness, change in vision, confusion or trouble speaking, loss of balance or coordination, trouble walking, seizures Unusual bruising or bleeding Side effects that usually do not require medical attention (report to your care team if they continue or are bothersome): Hair loss Nausea Unusual weakness or fatigue Vomiting This list may not describe all possible side effects. Call your doctor for medical advice about side effects. You may report side effects to FDA at 1-800-FDA-1088. Where should I keep my medication? This medication is given in a hospital or clinic. It will not be stored at home. NOTE: This sheet is a summary. It may not cover all possible information. If you have questions about this medicine, talk to your doctor, pharmacist, or health care provider.  2024 Elsevier/Gold Standard (2022-01-23 00:00:00)   Amivantamab Injection What is this medication? AMIVANTAMAB (AM i VAN ta mab) treats lung cancer. It works by helping your immune system slow or stop the spread of cancer cells. It is a monoclonal antibody. This medicine may be used for other purposes; ask your health care provider or pharmacist if you have questions. COMMON BRAND NAME(S): Rybrevant What should I tell my care team before I take  this medication? They need to know if you have any of these conditions: Eye disease Lung disease An unusual or allergic reaction to amivantamab, other medications, foods, dyes, or preservatives Pregnant or trying to get pregnant Breast-feeding How should I use this medication? This medication is injected into a vein. It is given by your care team in a hospital or clinic setting. Talk to your care team about the use of this medication in children. Special care may be needed. Overdosage: If you think you have taken too much of this medicine contact a poison control center or emergency room at once. NOTE: This medicine is only for you. Do not share this medicine with others. What if I miss a dose? Keep appointments for follow-up doses. It is important not to miss your dose. Call your care team if you are unable to keep an appointment. What may interact with this  medication? Interactions have not been studied. This list may not describe all possible interactions. Give your health care provider a list of all the medicines, herbs, non-prescription drugs, or dietary supplements you use. Also tell them if you smoke, drink alcohol, or use illegal drugs. Some items may interact with your medicine. What should I watch for while using this medication? Your condition will be monitored carefully while you are receiving this medication. You may need blood work while taking this medication. Tell your care team right away if you have any change in your eyesight. This medication can make you more sensitive to the sun. Keep out of the sun. If you cannot avoid being in the sun, wear protective clothing and sunscreen. Do not use sun lamps, tanning beds, or tanning booths. This medication may increase your risk of getting an infection. Call your care team for advice if you get a fever, chills, sore throat, or other symptoms of a cold or flu. Do not treat yourself. Try to avoid being around people who are sick. Talk  to your care team if you wish to become pregnant or think you might be pregnant. This medication can cause serious birth defects if taken during pregnancy or for 3 months after the last dose. A negative pregnancy test is required before starting this medication. A reliable form of contraception is recommended while taking this medication and for 3 months after the last dose. Talk to your care team about effective forms of contraception. Do not breastfeed while taking this medication and for 3 months after the last dose. What side effects may I notice from receiving this medication? Side effects that you should report to your care team as soon as possible: Allergic reactions--skin rash, itching, hives, swelling of the face, lips, tongue, or throat Dry cough, shortness of breath or trouble breathing Eye pain, redness, irritation, or discharge with blurry or decreased vision Infusion reactions--chest pain, shortness of breath or trouble breathing, feeling faint or lightheaded Redness, blistering, peeling, or loosening of the skin, including inside the mouth Side effects that usually do not require medical attention (report these to your care team if they continue or are bothersome): Bone, joint, or muscle pain Constipation Fatigue Nail infection--redness, swelling, warmth, or pain around the nail Nausea Pain, redness, or swelling with sores inside the mouth or throat Swelling of the ankles, hands, or feet This list may not describe all possible side effects. Call your doctor for medical advice about side effects. You may report side effects to FDA at 1-800-FDA-1088. Where should I keep my medication? This medication is given in a hospital or clinic. It will not be stored at home. NOTE: This sheet is a summary. It may not cover all possible information. If you have questions about this medicine, talk to your doctor, pharmacist, or health care provider.  2024 Elsevier/Gold Standard (2022-02-12  00:00:00)

## 2023-08-08 ENCOUNTER — Inpatient Hospital Stay: Payer: Managed Care, Other (non HMO)

## 2023-08-08 ENCOUNTER — Encounter: Payer: Self-pay | Admitting: Internal Medicine

## 2023-08-08 VITALS — BP 138/88 | HR 76 | Temp 96.9°F | Resp 18

## 2023-08-08 DIAGNOSIS — C3432 Malignant neoplasm of lower lobe, left bronchus or lung: Secondary | ICD-10-CM

## 2023-08-08 DIAGNOSIS — Z5112 Encounter for antineoplastic immunotherapy: Secondary | ICD-10-CM | POA: Diagnosis not present

## 2023-08-08 MED ORDER — ACETAMINOPHEN 325 MG PO TABS
650.0000 mg | ORAL_TABLET | Freq: Once | ORAL | Status: AC
  Administered 2023-08-08: 650 mg via ORAL
  Filled 2023-08-08: qty 2

## 2023-08-08 MED ORDER — FAMOTIDINE IN NACL 20-0.9 MG/50ML-% IV SOLN
20.0000 mg | Freq: Once | INTRAVENOUS | Status: AC
Start: 2023-08-08 — End: 2023-08-08
  Administered 2023-08-08: 20 mg via INTRAVENOUS
  Filled 2023-08-08: qty 50

## 2023-08-08 MED ORDER — SODIUM CHLORIDE 0.9 % IV SOLN
Freq: Once | INTRAVENOUS | Status: AC
  Filled 2023-08-08: qty 250

## 2023-08-08 MED ORDER — DEXAMETHASONE SODIUM PHOSPHATE 10 MG/ML IJ SOLN
10.0000 mg | Freq: Once | INTRAMUSCULAR | Status: AC
  Administered 2023-08-08: 10 mg via INTRAVENOUS
  Filled 2023-08-08: qty 1

## 2023-08-08 MED ORDER — DIPHENHYDRAMINE HCL 25 MG PO CAPS
50.0000 mg | ORAL_CAPSULE | Freq: Once | ORAL | Status: AC
  Administered 2023-08-08: 50 mg via ORAL
  Filled 2023-08-08: qty 2

## 2023-08-08 MED ORDER — AMIVANTAMAB-VMJW CHEMO INJECTION 350 MG/7ML
1400.0000 mg | Freq: Once | INTRAVENOUS | Status: AC
  Administered 2023-08-08: 1400 mg via INTRAVENOUS
  Filled 2023-08-08: qty 28

## 2023-08-08 MED ORDER — MONTELUKAST SODIUM 10 MG PO TABS
10.0000 mg | ORAL_TABLET | Freq: Once | ORAL | Status: AC
Start: 2023-08-08 — End: 2023-08-08
  Administered 2023-08-08: 10 mg via ORAL
  Filled 2023-08-08: qty 1

## 2023-08-08 NOTE — Patient Instructions (Signed)
[place] [facility]  Discharge Instructions: Thank you for choosing [facility] to provide your oncology and hematology care.  If you have a lab appointment with the Cancer Center, please go directly to the Cancer Center and check in at the registration area.  Wear comfortable clothing and clothing appropriate for easy access to any Portacath or PICC line.   We strive to give you quality time with your provider. You may need to reschedule your appointment if you arrive late (15 or more minutes).  Arriving late affects you and other patients whose appointments are after yours.  Also, if you miss three or more appointments without notifying the office, you may be dismissed from the clinic at the provider's discretion.      For prescription refill requests, have your pharmacy contact our office and allow 72 hours for refills to be completed.    Today you received the following chemotherapy and/or immunotherapy agents Rybrevant      To help prevent nausea and vomiting after your treatment, we encourage you to take your nausea medication as directed.  BELOW ARE SYMPTOMS THAT SHOULD BE REPORTED IMMEDIATELY: *FEVER GREATER THAN 100.4 F (38 C) OR HIGHER *CHILLS OR SWEATING *NAUSEA AND VOMITING THAT IS NOT CONTROLLED WITH YOUR NAUSEA MEDICATION *UNUSUAL SHORTNESS OF BREATH *UNUSUAL BRUISING OR BLEEDING *URINARY PROBLEMS (pain or burning when urinating, or frequent urination) *BOWEL PROBLEMS (unusual diarrhea, constipation, pain near the anus) TENDERNESS IN MOUTH AND THROAT WITH OR WITHOUT PRESENCE OF ULCERS (sore throat, sores in mouth, or a toothache) UNUSUAL RASH, SWELLING OR PAIN  UNUSUAL VAGINAL DISCHARGE OR ITCHING   Items with * indicate a potential emergency and should be followed up as soon as possible or go to the Emergency Department if any problems should occur.  Please show the CHEMOTHERAPY ALERT CARD or IMMUNOTHERAPY ALERT CARD at check-in to  the Emergency Department and triage nurse.  Should you have questions after your visit or need to cancel or reschedule your appointment, please contact [place] [facility]  [phone] and follow the prompts.  Office hours are 8:00 a.m. to 4:30 p.m. Monday - Friday. Please note that voicemails left after 4:00 p.m. may not be returned until the following business day.  We are closed weekends and major holidays. You have access to a nurse at all times for urgent questions. Please call the main number to the clinic [phone] and follow the prompts.  For any non-urgent questions, you may also contact your provider using MyChart. We now offer e-Visits for anyone 82 and older to request care online for non-urgent symptoms. For details visit [email or web address].   Also download the MyChart app! Go to the app store, search "MyChart", open the app, select [place], and log in with your MyChart username and password.

## 2023-08-09 ENCOUNTER — Encounter: Payer: Self-pay | Admitting: Internal Medicine

## 2023-08-09 ENCOUNTER — Encounter: Payer: Self-pay | Admitting: Nurse Practitioner

## 2023-08-09 MED ORDER — SERTRALINE HCL 50 MG PO TABS: 50.0000 mg | ORAL_TABLET | Freq: Every day | ORAL | 1 refills | Status: DC

## 2023-08-13 ENCOUNTER — Ambulatory Visit
Admission: RE | Admit: 2023-08-13 | Discharge: 2023-08-13 | Disposition: A | Discharge status: Home / Self Care | Payer: Managed Care, Other (non HMO) | Source: Ambulatory Visit | Attending: Hospice and Palliative Medicine | Admitting: Hospice and Palliative Medicine

## 2023-08-13 ENCOUNTER — Ambulatory Visit
Admission: RE | Admit: 2023-08-13 | Discharge: 2023-08-13 | Disposition: A | Discharge status: Home / Self Care | Payer: Managed Care, Other (non HMO) | Attending: Hospice and Palliative Medicine | Admitting: Hospice and Palliative Medicine

## 2023-08-13 ENCOUNTER — Other Ambulatory Visit: Payer: Self-pay | Admitting: *Deleted

## 2023-08-13 ENCOUNTER — Inpatient Hospital Stay: Payer: Managed Care, Other (non HMO)

## 2023-08-13 ENCOUNTER — Other Ambulatory Visit: Payer: Self-pay

## 2023-08-13 ENCOUNTER — Other Ambulatory Visit: Payer: Managed Care, Other (non HMO)

## 2023-08-13 ENCOUNTER — Inpatient Hospital Stay (HOSPITAL_BASED_OUTPATIENT_CLINIC_OR_DEPARTMENT_OTHER): Payer: Managed Care, Other (non HMO) | Admitting: Hospice and Palliative Medicine

## 2023-08-13 ENCOUNTER — Ambulatory Visit: Payer: Managed Care, Other (non HMO)

## 2023-08-13 ENCOUNTER — Ambulatory Visit: Payer: Managed Care, Other (non HMO) | Admitting: Internal Medicine

## 2023-08-13 ENCOUNTER — Encounter: Payer: Self-pay | Admitting: Internal Medicine

## 2023-08-13 ENCOUNTER — Encounter: Payer: Self-pay | Admitting: Hospice and Palliative Medicine

## 2023-08-13 ENCOUNTER — Ambulatory Visit
Admission: RE | Admit: 2023-08-13 | Discharge: 2023-08-13 | Disposition: A | Discharge status: Home / Self Care | Payer: Managed Care, Other (non HMO) | Source: Ambulatory Visit | Attending: Hospice and Palliative Medicine

## 2023-08-13 VITALS — BP 138/91 | HR 98 | Temp 98.9°F | Resp 22

## 2023-08-13 DIAGNOSIS — C3432 Malignant neoplasm of lower lobe, left bronchus or lung: Secondary | ICD-10-CM

## 2023-08-13 DIAGNOSIS — R109 Unspecified abdominal pain: Secondary | ICD-10-CM | POA: Insufficient documentation

## 2023-08-13 DIAGNOSIS — R0602 Shortness of breath: Secondary | ICD-10-CM

## 2023-08-13 DIAGNOSIS — E860 Dehydration: Secondary | ICD-10-CM | POA: Diagnosis not present

## 2023-08-13 DIAGNOSIS — R053 Chronic cough: Secondary | ICD-10-CM

## 2023-08-13 DIAGNOSIS — R112 Nausea with vomiting, unspecified: Secondary | ICD-10-CM

## 2023-08-13 DIAGNOSIS — Z5112 Encounter for antineoplastic immunotherapy: Secondary | ICD-10-CM | POA: Diagnosis not present

## 2023-08-13 DIAGNOSIS — J90 Pleural effusion, not elsewhere classified: Secondary | ICD-10-CM

## 2023-08-13 LAB — CBC WITH DIFFERENTIAL (CANCER CENTER ONLY)
Abs Immature Granulocytes: 0.01 10*3/uL (ref 0.00–0.07)
Basophils Absolute: 0.1 10*3/uL (ref 0.0–0.1)
Basophils Relative: 2 %
Eosinophils Absolute: 0.1 10*3/uL (ref 0.0–0.5)
Eosinophils Relative: 2 %
HCT: 47 % (ref 39.0–52.0)
Hemoglobin: 15.7 g/dL (ref 13.0–17.0)
Immature Granulocytes: 0 %
Lymphocytes Relative: 19 %
Lymphs Abs: 0.6 10*3/uL — ABNORMAL LOW (ref 0.7–4.0)
MCH: 32.6 pg (ref 26.0–34.0)
MCHC: 33.4 g/dL (ref 30.0–36.0)
MCV: 97.5 fL (ref 80.0–100.0)
Monocytes Absolute: 0.1 10*3/uL (ref 0.1–1.0)
Monocytes Relative: 2 %
Neutro Abs: 2.3 10*3/uL (ref 1.7–7.7)
Neutrophils Relative %: 75 %
Platelet Count: 98 10*3/uL — ABNORMAL LOW (ref 150–400)
RBC: 4.82 MIL/uL (ref 4.22–5.81)
RDW: 13.6 % (ref 11.5–15.5)
WBC Count: 3.1 10*3/uL — ABNORMAL LOW (ref 4.0–10.5)
nRBC: 0 % (ref 0.0–0.2)

## 2023-08-13 LAB — CMP (CANCER CENTER ONLY)
ALT: 118 U/L — ABNORMAL HIGH (ref 0–44)
AST: 155 U/L — ABNORMAL HIGH (ref 15–41)
Albumin: 3.1 g/dL — ABNORMAL LOW (ref 3.5–5.0)
Alkaline Phosphatase: 384 U/L — ABNORMAL HIGH (ref 38–126)
Anion gap: 9 (ref 5–15)
BUN: 17 mg/dL (ref 6–20)
CO2: 25 mmol/L (ref 22–32)
Calcium: 8.8 mg/dL — ABNORMAL LOW (ref 8.9–10.3)
Chloride: 101 mmol/L (ref 98–111)
Creatinine: 0.75 mg/dL (ref 0.61–1.24)
GFR, Estimated: >60 mL/min (ref >60)
Glucose, Bld: 115 mg/dL — ABNORMAL HIGH (ref 70–99)
Potassium: 3.9 mmol/L (ref 3.5–5.1)
Sodium: 135 mmol/L (ref 135–145)
Total Bilirubin: 2.5 mg/dL — ABNORMAL HIGH (ref 0.3–1.2)
Total Protein: 7.1 g/dL (ref 6.5–8.1)

## 2023-08-13 MED ORDER — SODIUM CHLORIDE 0.9% FLUSH
10.0000 mL | Freq: Once | INTRAVENOUS | Status: AC
  Filled 2023-08-13: qty 10

## 2023-08-13 MED ORDER — HEPARIN SOD (PORK) LOCK FLUSH 100 UNIT/ML IV SOLN
INTRAVENOUS | Status: AC
  Filled 2023-08-13: qty 5

## 2023-08-13 MED ORDER — HEPARIN SOD (PORK) LOCK FLUSH 100 UNIT/ML IV SOLN
500.0000 [IU] | Freq: Once | INTRAVENOUS | Status: AC
  Administered 2023-08-13: 500 [IU] via INTRAVENOUS
  Filled 2023-08-13: qty 5

## 2023-08-13 MED ORDER — HEPARIN SOD (PORK) LOCK FLUSH 100 UNIT/ML IV SOLN
500.0000 [IU] | Freq: Once | INTRAVENOUS | Status: AC
  Filled 2023-08-13: qty 5

## 2023-08-13 MED ORDER — SODIUM CHLORIDE 0.9 % IV SOLN
INTRAVENOUS | Status: AC
  Filled 2023-08-13 (×2): qty 250

## 2023-08-13 MED ORDER — ONDANSETRON HCL 4 MG/2ML IJ SOLN
8.0000 mg | Freq: Once | INTRAMUSCULAR | Status: AC
  Administered 2023-08-13: 8 mg via INTRAVENOUS

## 2023-08-13 MED ORDER — IOHEXOL 300 MG/ML  SOLN
100.0000 mL | Freq: Once | INTRAMUSCULAR | Status: AC | PRN
  Administered 2023-08-13: 100 mL via INTRAVENOUS

## 2023-08-13 NOTE — Progress Notes (Signed)
Symptom Management Clinic [facility] at [facility] [phone] [phone]  Patient Care Team: Pcp, No as PCP - General [name], RN as Oncology Nurse Navigator [name], MD as Consulting Physician (Internal Medicine)   NAME OF PATIENT: [name]  [id number]  [birth year]   DATE OF VISIT: 08/13/23  REASON FOR CONSULT: [name] is a 42 y.o. 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   INTERVAL HISTORY: Patient last saw Dr. Donneta Romberg on 10/24.  Patient found to have disease progression on PET 07/16/23 after 7 cycles Alimta+ M-vasi and was rotated to Carboplatin-Alimta-Amivatimab, which patient received day 1, cycle 1 on 08/07/2023 and day 2, cycle 1 on 08/08/2023.  Is scheduled to receive day 7, cycle 1 on 08/15/2023.  Patient presents [facility] today with 24 to 48 hours of intermittent nausea and vomiting and progressive left-sided abdominal pain.  Patient reports that he has not had a bowel movement in 5 days.  Is passing gas.  Feels like his abdomen is somewhat distended.  Tender to touch on the left side.  Is having occasional bilious vomiting.  Denies fever or chills.  Does have some cough and occasional wheezing.  No shortness of breath or chest pain.  Denies recent fevers or illnesses. Denies chest pain. Denies urinary complaints. Patient offers no further specific complaints today.  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   PORTA CATH INSERTION N/A 03/28/2021   Procedure: PORTA CATH INSERTION;  Surgeon: Renford Dills, MD;  Location: [facility] INVASIVE CV LAB;  Service:  Cardiovascular;  Laterality: N/A;   VIDEO BRONCHOSCOPY WITH ENDOBRONCHIAL NAVIGATION N/A 03/15/2021   Procedure: VIDEO BRONCHOSCOPY WITH ENDOBRONCHIAL NAVIGATION;  Surgeon: Vida Rigger, MD;  Location: [facility] ORS;  Service: Thoracic;  Laterality: N/A;   VIDEO BRONCHOSCOPY WITH ENDOBRONCHIAL ULTRASOUND N/A 03/15/2021   Procedure: VIDEO BRONCHOSCOPY WITH ENDOBRONCHIAL ULTRASOUND;  Surgeon: Vida Rigger, MD;  Location: [facility] ORS;  Service: Thoracic;  Laterality: N/A;    HEMATOLOGY/ONCOLOGY HISTORY:  Oncology History Overview Note  IMPRESSION: 1. Patchy nodular fat stranding throughout the anterior left upper quadrant peritoneal fat, nonspecific, cannot exclude peritoneal carcinomatosis. Dedicated CT abdomen/pelvis with oral and IV contrast recommended for further evaluation. 2. Dense patchy consolidation replacing much of the left lower lung lobe, appearing masslike in the superior segment left lower lobe, with associated bulging of the left major fissure and associated left lower lobe volume loss. Fine nodularity throughout both lungs with an upper lobe predominance. Asymmetric left upper lobe interlobular septal thickening. These findings are indeterminate, with differential including multilobar pneumonia, sarcoidosis or a neoplastic process. The persistence on radiographs back to 01/20/2021 despite antibiotic therapy make sarcoidosis or a neoplastic process more likely. Pulmonology consultation suggested for consideration of bronchoscopic evaluation. 3. Small dependent left pleural effusion. 4. Mild mediastinal lymphadenopathy, nonspecific. 5. Subacute healing lateral right sixth rib fracture.   DIAGNOSIS:  A. LUNG, LEFT LOWER LOBE; ENB-ASSISTED BIOPSY:  - NON-SMALL CELL CARCINOMA, FAVOR ADENOCARCINOMA.  - FOREIGN MATERIAL SUGGESTIVE OF ASPIRATION.   # EGFR MUTATED: 32 del- June 28th, 2022- [name]. [limited]  #  Asymptomatic multiple brain mets subcentimeter asymptomatic -JUNE  29th, 2023- Numerous metastatic lesions in the supratentorial brain-increase in number also conspicuity compared to January 2023.  Pre-SBRT MRI brain AUG 18th, 2023-  Eighteen small enhancing brain metastases (punctate to 11 mm); were all present on 03/28/2021-s/p SBRT SEP 2023- [[place]]; SRS/ radiation treatment for brain metastasis He finished treatment on 11-26-22.   # SEP-OCT-worsening left-sided pleural effusion-status post paracentesis; exudative; Cytology negative- ?  Progressive disease.  # NOV 2023- JAN 2024 CARBO-TAXOL-BEV+ATEZO x4 cyles- BEV+ATEZO- until APRIL 2024  # STAGE IV- [JUNE 2022] Left lower lobe lung cancer-adenocarcinoma the lung;  EGFR- 19 del POSITIVE.  While on osimertinib- NOV 1st, 2023- PET scan- shows progressive disease in liver. Liquid Biopsy/Guardant testing-positive for EGFR mutation; otherwise Negative for any novel mutations.  Repeat liver Biopsy positive for adenocarcinoma. NEGATIVE for MET amplification.  S/p second opinion at [facility]- Dr.Stinchcomb. status post 4 cycles of carbo Taxol-Atezo+Bev- JAN 25th, 2024-PET- Interval response to therapy with resolution of previously demonstrated extensive hypermetabolic activity throughout the liver. PARIL 2024- ? Partial response, but clinically worse/rising LFTs  # MAY 6th, Brain MRI-progressive multiple brain metastases  # MAY 16th, 2024- Alimta+Avastin #1    Cancer of lower lobe of left lung (HCC)  03/23/2021 Initial Diagnosis   Cancer of lower lobe of left lung (HCC)   03/29/2021 Cancer Staging   Staging form: Lung, AJCC 8th Edition - Clinical: Stage IVB (cT3, cN3, pM1c) - Signed by [name], MD on 03/29/2021   03/30/2021 - 03/30/2021 Chemotherapy   Patient is on Treatment Plan : LUNG NSCLC Pemetrexed (Alimta) / Carboplatin q21d x 1 cycles     08/17/2022 - 01/15/2023 Chemotherapy   Patient is on Treatment Plan : LUNG Atezolizumab + Bevacizumab + Carboplatin + Paclitaxel q21d Induction x 4 cycles / Atezolizumab +  Bevacizumab q21d Maintenance     02/28/2023 - 07/02/2023 Chemotherapy   Patient is on Treatment Plan : LUNG NON-SMALL CELL Bevacizumab + Pemetrexed q21d     08/07/2023 -  Chemotherapy   Patient is on Treatment Plan : LUNG NSCLC Pemetrexed + Carboplatin + Amivantamab q21d x 4 Cycles / Pemetrexed + Amivantamab q21d       ALLERGIES:  is allergic to codeine.  MEDICATIONS:  Current Outpatient Medications  Medication Sig Dispense Refill   albuterol (VENTOLIN HFA) 108 (90 Base) MCG/ACT inhaler Inhale 2 puffs into the lungs every 6 (six) hours as needed for wheezing or shortness of breath. 8 g 2   apixaban (ELIQUIS) 2.5 MG TABS tablet Take 1 tablet (2.5 mg total) by mouth 2 (two) times daily. 60 tablet 4   calcium carbonate (TUMS EX) 750 MG chewable tablet Chew 1 tablet by mouth daily.     folic acid (FOLVITE) 1 MG tablet TAKE 1 TABLET BY MOUTH EVERY DAY 90 tablet 1   lidocaine-prilocaine (EMLA) cream Apply 1 Application topically as needed. 30 g 0   metoprolol succinate (TOPROL-XL) 50 MG 24 hr tablet Take 50 mg by mouth daily. Take with or immediately following a meal.     montelukast (SINGULAIR) 10 MG tablet TAKE 1 TABLET BY MOUTH EVERYDAY AT BEDTIME 90 tablet 1   OLANZapine (ZYPREXA) 5 MG tablet Take 1 tablet (5 mg total) by mouth at bedtime as needed (nausea). 30 tablet 0   ondansetron (ZOFRAN-ODT) 4 MG disintegrating tablet Take 1 tablet (4 mg total) by mouth every 8 (eight) hours as needed for nausea or vomiting. 20 tablet 1   sertraline (ZOLOFT) 50 MG  tablet Take 1 tablet (50 mg total) by mouth daily. 30 tablet 1   traMADol (ULTRAM) 50 MG tablet Take 1 tablet (50 mg total) by mouth every 12 (twelve) hours as needed. 30 tablet 0   Vitamin D, Ergocalciferol, (DRISDOL) 1.25 MG (50000 UNIT) CAPS capsule TAKE 1 CAPSULE BY MOUTH ONE TIME PER WEEK 12 capsule 1   No current facility-administered medications for this visit.   Facility-Administered Medications Ordered in Other Visits  Medication  Dose Route Frequency Provider Last Rate Last Admin   heparin lock flush 100 UNIT/ML injection            heparin lock flush 100 UNIT/ML injection            sodium chloride flush (NS) 0.9 % injection 10 mL  10 mL Intravenous PRN [name], MD   10 mL at 07/28/21 0852    VITAL SIGNS: There were no vitals taken for this visit. There were no vitals filed for this visit.   Estimated body mass index is 30.8 kg/m as calculated from the following:   Height as of 07/02/23: 5\' 11"  (1.803 m).   Weight as of 08/06/23: 220 lb 12.8 oz (100.2 kg).  LABS: CBC:    Component Value Date/Time   WBC 5.8 08/06/2023 0832   WBC 6.7 05/27/2023 0943   HGB 15.0 08/06/2023 0832   HCT 45.0 08/06/2023 0832   PLT 150 08/06/2023 0832   MCV 98.7 08/06/2023 0832   NEUTROABS 3.4 08/06/2023 0832   LYMPHSABS 1.2 08/06/2023 0832   MONOABS 0.9 08/06/2023 0832   EOSABS 0.2 08/06/2023 0832   BASOSABS 0.1 08/06/2023 0832   Comprehensive Metabolic Panel:    Component Value Date/Time   NA 135 08/06/2023 0832   K 3.7 08/06/2023 0832   CL 103 08/06/2023 0832   CO2 25 08/06/2023 0832   BUN 7 08/06/2023 0832   CREATININE 0.72 08/06/2023 0832   GLUCOSE 103 (H) 08/06/2023 0832   CALCIUM 8.7 (L) 08/06/2023 0832   AST 152 (H) 08/06/2023 0832   ALT 90 (H) 08/06/2023 0832   ALKPHOS 329 (H) 08/06/2023 0832   BILITOT 1.0 08/06/2023 0832   PROT 6.8 08/06/2023 0832   ALBUMIN 3.1 (L) 08/06/2023 0832    RADIOGRAPHIC STUDIES: MR BRAIN W WO CONTRAST  Result Date: 07/30/2023 CLINICAL DATA:  42 year old male with metastatic lung cancer. Progression of numerous subcentimeter enhancing lesions in May, subsequently regressed in July. Immunotherapy. Progressive liver metastases on PET-CT earlier this month. Restaging. EXAM: MRI HEAD WITHOUT AND WITH CONTRAST TECHNIQUE: Multiplanar, multiecho pulse sequences of the brain and surrounding structures were obtained without and with intravenous contrast. CONTRAST:  10 mL  [employer] COMPARISON:  04/23/2023 and earlier. FINDINGS: Brain: Overall black blood postcontrast imaging reveals no disease progression compared to 02/18/2023. Overall few are subcentimeter enhancing lesions since that time, and those which are visible are stable since May. Multiple small lesions in the right parietal and occipital lobes are no longer visible. Most other lesions have not enlarged. A range from punctate (left temporal lobe series 13, image 46) up to approximately 12 mm (medial anterior right temporal lobe image 47). Subtle increased conspicuity of the cerebellar folia on postcontrast black blood (especially series 13, image 49), but this is not correlated with convincing leptomeningeal thickening on the other postcontrast sequences (series 15, image 14) and is felt to be artifact. Approximately a dozen lesions remain visible on series 13 (annotated), and not all of these are apparent on MPRAGE. Occasional associated  T2 and FLAIR hyperintensity (left frontal operculum, middle frontal gyrus) is unchanged since May. Stable cerebral volume. No restricted diffusion to suggest acute infarction. No midline shift, mass effect, ventriculomegaly, extra-axial collection or acute intracranial hemorrhage. Cervicomedullary junction and pituitary are within normal limits. No new signal abnormality. Occasional post treatment hemosiderin of the lesions is stable. Vascular: Major intracranial vascular flow voids are stable. Dominant distal right vertebral artery. Skull and upper cervical spine: Negative visible cervical spine and spinal cord. Visualized bone marrow signal is within normal limits. Sinuses/Orbits: Stable, negative. Other: Mastoids remain clear. Visible internal auditory structures appear normal. IMPRESSION: Stable post treatment appearance of the brain since 02/18/2023: - approximately a dozen small enhancing metastases remain visible (series 13) ranging from punctate to 12 mm. No significant edema or mass  effect. - no new metastasis identified; subtle increased conspicuity of cerebellum leptomeninges on only series 13 is felt to be artifact. Attention on follow-up. Electronically Signed   By: Odessa Fleming M.D.   On: 07/30/2023 10:54   NM PET Image Restage (PS) Skull Base to Thigh (F-18 FDG)  Result Date: 07/17/2023 CLINICAL DATA:  Subsequent treatment strategy for recurrent stage IV non-small cell left lower lobe lung cancer. EXAM: NUCLEAR MEDICINE PET SKULL BASE TO THIGH TECHNIQUE: 11.9 mCi F-18 FDG was injected intravenously. Full-ring PET imaging was performed from the skull base to thigh after the radiotracer. CT data was obtained and used for attenuation correction and anatomic localization. Fasting blood glucose: 93 mg/dl COMPARISON:  19/14/7829 PET-CT.  05/27/2023 CT abdomen/pelvis. FINDINGS: Mediastinal blood pool activity: SUV max 2.6 Liver activity: SUV max NA NECK: New hypermetabolic bilateral level 2 neck lymph nodes, largest 0.7 cm on the right with max SUV 5.9 (series 6/image 28). Incidental CT findings: Right internal jugular Port-A-Cath terminates at the cavoatrial junction. CHEST: Mild patchy hypermetabolism associated with bandlike chronic consolidation in the left lower lobe with max SUV 5.8, previous max SUV 5.0, not substantially changed favoring inflammatory uptake. No new focal hypermetabolic pulmonary nodules. No enlarged or hypermetabolic mediastinal, hilar or axillary lymph nodes. Incidental CT findings: Chronic loculated large peripheral left pleural effusion with smooth left pleural thickening and associated prominent left lung volume loss, unchanged. Interlobular septal thickening throughout the right lung is mildly increased. Stable tiny 0.3 cm apical right upper lobe nodule, below PET resolution (series 6/image 52). ABDOMEN/PELVIS: Widespread poorly marginated hypermetabolic hypodense liver masses, increased in size and metabolism. Representative segment 4 left liver 3.5 x 3.1 cm mass  with max SUV 9.9 (series 6/image 87), previously 2.6 x 2.5 cm with max SUV 5.6. Representative posterior right liver dome 6.0 x 2.6 cm mass with max SUV 7.7 (series 6/image 78), previously 5.3 x 2.2 cm with max SUV 5.7. No abnormal hypermetabolic activity within the pancreas, adrenal glands, or spleen. No hypermetabolic lymph nodes in the abdomen or pelvis. Incidental CT findings: Small hiatal hernia. Small volume ascites is new. SKELETON: New hypermetabolic lytic posterior T3 vertebral 1.0 cm lesion with max SUV 5.3 (series 6/image 50). No additional focal hypermetabolic skeletal lesions. Incidental CT findings: None. IMPRESSION: 1. Widespread hypermetabolic poorly marginated bilobar liver metastases, increased in size and metabolism since 04/22/2023 PET-CT. 2. New hypermetabolic lytic posterior T3 vertebral metastasis. 3. New hypermetabolic subcentimeter bilateral level 2 neck lymph nodes, indeterminate for reactive versus metastatic nodes. 4. Interlobular septal thickening throughout the right lung is mildly increased, cannot exclude lymphangitic carcinomatosis. 5. Stable mild patchy hypermetabolism associated with bandlike chronic consolidation in the left lower lobe, favoring inflammatory uptake.  Chronic loculated large left pleural effusion with smooth left pleural thickening. 6. New small volume ascites. 7. Small hiatal hernia. Electronically Signed   By: Delbert Phenix M.D.   On: 07/17/2023 12:57    PERFORMANCE STATUS (ECOG) : 1 - Symptomatic but completely ambulatory  Review of Systems Unless otherwise noted, a complete review of systems is negative.  Physical Exam General: NAD Cardiovascular: regular rate and rhythm Pulmonary: clear ant fields Abdomen: distended, tender L side, hypoactive bowel sounds GU: no suprapubic tenderness Extremities: no edema, no joint deformities Skin: no rashes Neurological: Weakness but otherwise nonfocal  IMPRESSION/PLAN: Stage IV non-small cell lung cancer  with brain/bone metastasis -PET scan 07/16/23 showed progressive disease.  Patient status post cycle 1 Carboplatin-Alimta-Amivatimab.  Nausea/vomiting/abd pain -abdominal distention and patient is focally tender on the left side.  This could reflect constipation/obstruction as patient has not had a bowel movement in 5 days.  However, labs also show new hyperbilirubinemia with baseline elevated transaminases.  Discussed with Dr. Orlie Dakin and will proceed with CT abdomen and pelvis for further evaluation.  Proceed with IV fluids and supportive care today.  Recurrent left pleural effusion -demonstrated on chest x-ray.  Last thoracentesis on 8/13 with 350 mL fluid removed.  Can can consider repeat thoracentesis as needed.  Patient has follow-up scheduled on 10/31 but can be seen sooner if needed  Addendum: CT showed large volume ascites, which I suspect is the primary culprit for patient's abdominal discomfort.  Will proceed with paracentesis.  CT also suggestive of cancer progression in the liver, which likely explains hyperbilirubinemia and elevated transaminases.  Will discuss with Dr. Donneta Romberg.  Discussed with patient and wife.  Patient has had a bowel movement.  Encouraged daily bowel regimen.  They are not interested in thoracentesis but would like to proceed with paracentesis.   Patient expressed understanding and was in agreement with this plan. He also understands that He can call clinic at any time with any questions, concerns, or complaints.   Thank you for allowing me to participate in the care of this very pleasant patient.   Time Total: 25 minutes  Visit consisted of counseling and education dealing with the complex and emotionally intense issues of symptom management in the setting of serious illness.Greater than 50%  of this time was spent counseling and coordinating care related to the above assessment and plan.  Signed by: Laurette Schimke, PhD, NP-C

## 2023-08-14 ENCOUNTER — Encounter: Payer: Self-pay | Admitting: Internal Medicine

## 2023-08-14 ENCOUNTER — Ambulatory Visit
Admission: RE | Admit: 2023-08-14 | Discharge: 2023-08-14 | Disposition: A | Discharge status: Home / Self Care | Payer: Managed Care, Other (non HMO) | Source: Ambulatory Visit | Attending: Hospice and Palliative Medicine | Admitting: Hospice and Palliative Medicine

## 2023-08-14 ENCOUNTER — Other Ambulatory Visit: Payer: Self-pay | Admitting: *Deleted

## 2023-08-14 DIAGNOSIS — C3432 Malignant neoplasm of lower lobe, left bronchus or lung: Secondary | ICD-10-CM | POA: Insufficient documentation

## 2023-08-14 DIAGNOSIS — R180 Malignant ascites: Secondary | ICD-10-CM

## 2023-08-14 LAB — CEA: CEA: 25.5 ng/mL — ABNORMAL HIGH (ref 0.0–4.7)

## 2023-08-14 MED ORDER — LIDOCAINE HCL (PF) 1 % IJ SOLN
10.0000 mL | Freq: Once | INTRAMUSCULAR | Status: AC
  Administered 2023-08-14: 10 mL via INTRADERMAL

## 2023-08-14 NOTE — Procedures (Signed)
PROCEDURE SUMMARY:  Successful US guided paracentesis from right abdomen.  Yielded 5.2 L of amber-colored fluid.  No immediate complications.  Pt tolerated well.   Specimen not sent for labs.  EBL < 2 mL  Mickie Kay, NP 08/14/2023 3:21 PM

## 2023-08-15 ENCOUNTER — Encounter: Payer: Self-pay | Admitting: Internal Medicine

## 2023-08-15 ENCOUNTER — Encounter: Payer: Self-pay | Admitting: Nurse Practitioner

## 2023-08-15 ENCOUNTER — Inpatient Hospital Stay: Payer: Managed Care, Other (non HMO)

## 2023-08-15 ENCOUNTER — Inpatient Hospital Stay: Payer: Managed Care, Other (non HMO) | Admitting: Internal Medicine

## 2023-08-15 ENCOUNTER — Other Ambulatory Visit: Payer: Self-pay

## 2023-08-15 VITALS — BP 118/87 | HR 90 | Temp 96.9°F | Ht 71.0 in | Wt 202.2 lb

## 2023-08-15 VITALS — BP 122/77 | HR 84

## 2023-08-15 DIAGNOSIS — Z5112 Encounter for antineoplastic immunotherapy: Secondary | ICD-10-CM | POA: Diagnosis not present

## 2023-08-15 DIAGNOSIS — C3432 Malignant neoplasm of lower lobe, left bronchus or lung: Secondary | ICD-10-CM

## 2023-08-15 DIAGNOSIS — F4320 Adjustment disorder, unspecified: Secondary | ICD-10-CM | POA: Insufficient documentation

## 2023-08-15 LAB — CBC WITH DIFFERENTIAL (CANCER CENTER ONLY)
Abs Immature Granulocytes: 0.02 10*3/uL (ref 0.00–0.07)
Basophils Absolute: 0 10*3/uL (ref 0.0–0.1)
Basophils Relative: 1 %
Eosinophils Absolute: 0.1 10*3/uL (ref 0.0–0.5)
Eosinophils Relative: 2 %
HCT: 42.6 % (ref 39.0–52.0)
Hemoglobin: 14.6 g/dL (ref 13.0–17.0)
Immature Granulocytes: 1 %
Lymphocytes Relative: 22 %
Lymphs Abs: 0.5 10*3/uL — ABNORMAL LOW (ref 0.7–4.0)
MCH: 32.8 pg (ref 26.0–34.0)
MCHC: 34.3 g/dL (ref 30.0–36.0)
MCV: 95.7 fL (ref 80.0–100.0)
Monocytes Absolute: 0.1 10*3/uL (ref 0.1–1.0)
Monocytes Relative: 3 %
Neutro Abs: 1.8 10*3/uL (ref 1.7–7.7)
Neutrophils Relative %: 71 %
Platelet Count: 63 10*3/uL — ABNORMAL LOW (ref 150–400)
RBC: 4.45 MIL/uL (ref 4.22–5.81)
RDW: 13.4 % (ref 11.5–15.5)
Smear Review: NORMAL
WBC Count: 2.5 10*3/uL — ABNORMAL LOW (ref 4.0–10.5)
nRBC: 0 % (ref 0.0–0.2)

## 2023-08-15 LAB — CMP (CANCER CENTER ONLY)
ALT: 109 U/L — ABNORMAL HIGH (ref 0–44)
AST: 140 U/L — ABNORMAL HIGH (ref 15–41)
Albumin: 2.8 g/dL — ABNORMAL LOW (ref 3.5–5.0)
Alkaline Phosphatase: 333 U/L — ABNORMAL HIGH (ref 38–126)
Anion gap: 7 (ref 5–15)
BUN: 11 mg/dL (ref 6–20)
CO2: 26 mmol/L (ref 22–32)
Calcium: 8.4 mg/dL — ABNORMAL LOW (ref 8.9–10.3)
Chloride: 101 mmol/L (ref 98–111)
Creatinine: 0.55 mg/dL — ABNORMAL LOW (ref 0.61–1.24)
GFR, Estimated: >60 mL/min (ref >60)
Glucose, Bld: 107 mg/dL — ABNORMAL HIGH (ref 70–99)
Potassium: 3.5 mmol/L (ref 3.5–5.1)
Sodium: 134 mmol/L — ABNORMAL LOW (ref 135–145)
Total Bilirubin: 2.3 mg/dL — ABNORMAL HIGH (ref 0.3–1.2)
Total Protein: 6.1 g/dL — ABNORMAL LOW (ref 6.5–8.1)

## 2023-08-15 LAB — BILIRUBIN, TOTAL AND DIRECT (CANCER CENTER ONLY)
Bilirubin, Direct: 0.9 mg/dL — ABNORMAL HIGH (ref 0.0–0.2)
Indirect Bilirubin: 1.4 mg/dL — ABNORMAL HIGH (ref 0.3–0.9)
Total Bilirubin: 2.3 mg/dL — ABNORMAL HIGH (ref 0.3–1.2)

## 2023-08-15 MED ORDER — ONDANSETRON HCL 4 MG/2ML IJ SOLN
8.0000 mg | Freq: Once | INTRAMUSCULAR | Status: AC
  Administered 2023-08-15: 8 mg via INTRAVENOUS
  Filled 2023-08-15: qty 4

## 2023-08-15 MED ORDER — FAMOTIDINE IN NACL 20-0.9 MG/50ML-% IV SOLN
20.0000 mg | Freq: Once | INTRAVENOUS | Status: AC
  Administered 2023-08-15: 20 mg via INTRAVENOUS
  Filled 2023-08-15: qty 50

## 2023-08-15 MED ORDER — MONTELUKAST SODIUM 10 MG PO TABS
10.0000 mg | ORAL_TABLET | Freq: Once | ORAL | Status: AC
Start: 2023-08-15 — End: 2023-08-15
  Administered 2023-08-15: 10 mg via ORAL
  Filled 2023-08-15: qty 1

## 2023-08-15 MED ORDER — DIPHENHYDRAMINE HCL 25 MG PO CAPS
50.0000 mg | ORAL_CAPSULE | Freq: Once | ORAL | Status: AC
  Administered 2023-08-15: 50 mg via ORAL
  Filled 2023-08-15: qty 2

## 2023-08-15 MED ORDER — ACETAMINOPHEN 325 MG PO TABS
650.0000 mg | ORAL_TABLET | Freq: Once | ORAL | Status: AC
  Administered 2023-08-15: 650 mg via ORAL
  Filled 2023-08-15: qty 2

## 2023-08-15 MED ORDER — AMIVANTAMAB-VMJW CHEMO INJECTION 350 MG/7ML
1750.0000 mg | Freq: Once | INTRAVENOUS | Status: AC
  Administered 2023-08-15: 1750 mg via INTRAVENOUS
  Filled 2023-08-15: qty 35

## 2023-08-15 MED ORDER — DEXAMETHASONE SODIUM PHOSPHATE 10 MG/ML IJ SOLN
10.0000 mg | Freq: Once | INTRAMUSCULAR | Status: AC
  Administered 2023-08-15: 10 mg via INTRAVENOUS
  Filled 2023-08-15: qty 1

## 2023-08-15 NOTE — Progress Notes (Signed)
CT and xray was done Tuesday.

## 2023-08-15 NOTE — Progress Notes (Signed)
[facility] CONSULT NOTE  Patient Care Team: Pcp, No as PCP - General [name], RN as Oncology Nurse Navigator [name], MD as Consulting Physician (Internal Medicine)  CHIEF COMPLAINTS/PURPOSE OF CONSULTATION: Lung cancer  #  Oncology History Overview Note  IMPRESSION: 1. Patchy nodular fat stranding throughout the anterior left upper quadrant peritoneal fat, nonspecific, cannot exclude peritoneal carcinomatosis. Dedicated CT abdomen/pelvis with oral and IV contrast recommended for further evaluation. 2. Dense patchy consolidation replacing much of the left lower lung lobe, appearing masslike in the superior segment left lower lobe, with associated bulging of the left major fissure and associated left lower lobe volume loss. Fine nodularity throughout both lungs with an upper lobe predominance. Asymmetric left upper lobe interlobular septal thickening. These findings are indeterminate, with differential including multilobar pneumonia, sarcoidosis or a neoplastic process. The persistence on radiographs back to 01/20/2021 despite antibiotic therapy make sarcoidosis or a neoplastic process more likely. Pulmonology consultation suggested for consideration of bronchoscopic evaluation. 3. Small dependent left pleural effusion. 4. Mild mediastinal lymphadenopathy, nonspecific. 5. Subacute healing lateral right sixth rib fracture.   DIAGNOSIS:  A. LUNG, LEFT LOWER LOBE; ENB-ASSISTED BIOPSY:  - NON-SMALL CELL CARCINOMA, FAVOR ADENOCARCINOMA.  - FOREIGN MATERIAL SUGGESTIVE OF ASPIRATION.   # EGFR MUTATED: 61 del- June 28th, 2022- [name]. [limited]  # Asymptomatic multiple brain mets subcentimeter asymptomatic -JUNE 29th, 2023- Numerous metastatic lesions in the supratentorial brain-increase in number also conspicuity compared to January 2023.  Pre-SBRT MRI brain AUG 18th, 2023-  Eighteen small enhancing brain metastases (punctate to 11 mm); were all  present on 03/28/2021-s/p SBRT SEP 2023- [[place]]; SRS/ radiation treatment for brain metastasis He finished treatment on 11-26-22.   # SEP-OCT-worsening left-sided pleural effusion-status post paracentesis; exudative; Cytology negative- ?  Progressive disease.  # NOV 2023- JAN 2024 CARBO-TAXOL-BEV+ATEZO x4 cyles- BEV+ATEZO- until APRIL 2024  # STAGE IV- [JUNE 2022] Left lower lobe lung cancer-adenocarcinoma the lung;  EGFR- 19 del POSITIVE.  While on osimertinib- NOV 1st, 2023- PET scan- shows progressive disease in liver. Liquid Biopsy/Guardant testing-positive for EGFR mutation; otherwise Negative for any novel mutations.  Repeat liver Biopsy positive for adenocarcinoma. NEGATIVE for MET amplification.  S/p second opinion at [facility]- Dr.Stinchcomb. status post 4 cycles of carbo Taxol-Atezo+Bev- JAN 25th, 2024-PET- Interval response to therapy with resolution of previously demonstrated extensive hypermetabolic activity throughout the liver. PARIL 2024- ? Partial response, but clinically worse/rising LFTs  # MAY 6th, Brain MRI-progressive multiple brain metastases  # MAY 16th, 2024- Alimta+Avastin #1    Cancer of lower lobe of left lung (HCC)  03/23/2021 Initial Diagnosis   Cancer of lower lobe of left lung (HCC)   03/29/2021 Cancer Staging   Staging form: Lung, AJCC 8th Edition - Clinical: Stage IVB (cT3, cN3, pM1c) - Signed by [name], MD on 03/29/2021   03/30/2021 - 03/30/2021 Chemotherapy   Patient is on Treatment Plan : LUNG NSCLC Pemetrexed (Alimta) / Carboplatin q21d x 1 cycles     08/17/2022 - 01/15/2023 Chemotherapy   Patient is on Treatment Plan : LUNG Atezolizumab + Bevacizumab + Carboplatin + Paclitaxel q21d Induction x 4 cycles / Atezolizumab + Bevacizumab q21d Maintenance     02/28/2023 - 07/02/2023 Chemotherapy   Patient is on Treatment Plan : LUNG NON-SMALL CELL Bevacizumab + Pemetrexed q21d     08/07/2023 -  Chemotherapy   Patient is on Treatment Plan : LUNG NSCLC  Pemetrexed + Carboplatin + Amivantamab q21d x 4 Cycles / Pemetrexed + Amivantamab  q21d      HISTORY OF PRESENTING ILLNESS: Ambulating independently.  Accompanied by his wife.  [name] 42 y.o.  male patient with stage IV lung cancer adenocarcinoma-brain mets [synchronous] bone mets EGFR mutated -most recently noted to have progression on Alimta- Bev is here for follow-up/and proceed with chemotherapy with carboplatin-Alimta- Ami   Patient reports this treatment has been rough.  Feels fatigued and tired.  Appetite has been poor. Reports nausea and 3 episodes of vomiting.  Has been using Zyprexa and Zofran. Had 5.2 L abdominal fluid removed yesterday.  Reports improvement in distention.  But continues to have left-sided abdominal pain which is tender even on mild palpation. Denies diarrhea.  Having bowel movements after every few days.   Review of Systems  Constitutional:  Positive for malaise/fatigue. Negative for chills, diaphoresis and fever.  HENT:  Negative for nosebleeds and sore throat.   Eyes:  Negative for double vision.  Respiratory:  Positive for cough. Negative for wheezing.   Cardiovascular:  Negative for chest pain, palpitations, orthopnea and leg swelling.  Gastrointestinal:  Positive for heartburn and nausea. Negative for abdominal pain, blood in stool, diarrhea and melena.  Genitourinary:  Negative for dysuria, frequency and urgency.  Musculoskeletal:  Negative for back pain and joint pain.  Skin:  Negative for itching.  Neurological:  Negative for tingling and focal weakness.  Endo/Heme/Allergies:  Does not bruise/bleed easily.  Psychiatric/Behavioral:  Negative for depression. The patient is not nervous/anxious and does not have insomnia.      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    SURGICAL HISTORY: Past Surgical History:   Procedure Laterality Date   FRACTURE SURGERY     broke femur when he was 6 years   PORTA CATH INSERTION N/A 03/28/2021   Procedure: PORTA CATH INSERTION;  Surgeon: Renford Dills, MD;  Location: [facility] INVASIVE CV LAB;  Service: Cardiovascular;  Laterality: N/A;   VIDEO BRONCHOSCOPY WITH ENDOBRONCHIAL NAVIGATION N/A 03/15/2021   Procedure: VIDEO BRONCHOSCOPY WITH ENDOBRONCHIAL NAVIGATION;  Surgeon: Vida Rigger, MD;  Location: [facility] ORS;  Service: Thoracic;  Laterality: N/A;   VIDEO BRONCHOSCOPY WITH ENDOBRONCHIAL ULTRASOUND N/A 03/15/2021   Procedure: VIDEO BRONCHOSCOPY WITH ENDOBRONCHIAL ULTRASOUND;  Surgeon: Vida Rigger, MD;  Location: [facility] ORS;  Service: Thoracic;  Laterality: N/A;    SOCIAL HISTORY: Social History   Socioeconomic History   Marital status: Married    Spouse name: Darl Pikes   Number of children: 2   Years of education: Not on file   Highest education level: Not on file  Occupational History   Not on file  Tobacco Use   Smoking status: Never   Smokeless tobacco: Former    Types: Snuff, Chew  Vaping Use   Vaping status: Never Used  Substance and Sexual Activity   Alcohol use: Never   Drug use: Never   Sexual activity: Yes  Other Topics Concern   Not on file  Social History Narrative   [place]; with wife; 2 daughters[12 and 22]; never smoked; rare alcohol. 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 Dog and cat.   Social Determinants of Health   Financial Resource Strain: Low Risk  (05/10/2022)   Overall Financial Resource Strain (CARDIA)    Difficulty of Paying Living Expenses: Not hard at all  Food Insecurity: No Food Insecurity (05/10/2022)  Hunger Vital Sign    Worried About Running Out of Food in the Last Year: Never true    Ran Out of Food in the Last Year: Never true  Transportation Needs: No Transportation Needs (03/16/2022)   PRAPARE - Administrator, Civil Service (Medical): No    Lack of Transportation (Non-Medical): No  Physical Activity: Not on  file  Stress: Not on file  Social Connections: Socially Integrated (05/10/2022)   Social Connection and Isolation Panel [NHANES]    Frequency of Communication with Friends and Family: More than three times a week    Frequency of Social Gatherings with Friends and Family: More than three times a week    Attends Religious Services: More than 4 times per year    Active Member of [employer] or Organizations: Yes    Attends Engineer, structural: More than 4 times per year    Marital Status: Married  Catering manager Violence: Not on file    FAMILY HISTORY: Family History  Problem Relation Age of Onset   High Cholesterol Mother    Hypertension Father    Lung cancer Father    Skin cancer Father     ALLERGIES:  is allergic to codeine.  MEDICATIONS:  Current Outpatient Medications  Medication Sig Dispense Refill   albuterol (VENTOLIN HFA) 108 (90 Base) MCG/ACT inhaler Inhale 2 puffs into the lungs every 6 (six) hours as needed for wheezing or shortness of breath. 8 g 2   apixaban (ELIQUIS) 2.5 MG TABS tablet Take 1 tablet (2.5 mg total) by mouth 2 (two) times daily. 60 tablet 4   calcium carbonate (TUMS EX) 750 MG chewable tablet Chew 1 tablet by mouth daily.     folic acid (FOLVITE) 1 MG tablet TAKE 1 TABLET BY MOUTH EVERY DAY 90 tablet 1   lidocaine-prilocaine (EMLA) cream Apply 1 Application topically as needed. 30 g 0   metoprolol succinate (TOPROL-XL) 50 MG 24 hr tablet Take 50 mg by mouth daily. Take with or immediately following a meal.     montelukast (SINGULAIR) 10 MG tablet TAKE 1 TABLET BY MOUTH EVERYDAY AT BEDTIME 90 tablet 1   OLANZapine (ZYPREXA) 5 MG tablet Take 1 tablet (5 mg total) by mouth at bedtime as needed (nausea). 30 tablet 0   ondansetron (ZOFRAN-ODT) 4 MG disintegrating tablet Take 1 tablet (4 mg total) by mouth every 8 (eight) hours as needed for nausea or vomiting. 20 tablet 1   sertraline (ZOLOFT) 50 MG tablet Take 1 tablet (50 mg total) by mouth daily. 30  tablet 1   traMADol (ULTRAM) 50 MG tablet Take 1 tablet (50 mg total) by mouth every 12 (twelve) hours as needed. 30 tablet 0   Vitamin D, Ergocalciferol, (DRISDOL) 1.25 MG (50000 UNIT) CAPS capsule TAKE 1 CAPSULE BY MOUTH ONE TIME PER WEEK 12 capsule 1   No current facility-administered medications for this visit.   Facility-Administered Medications Ordered in Other Visits  Medication Dose Route Frequency Provider Last Rate Last Admin   amivantamab-vmjw (RYBREVANT) 1,750 mg in sodium chloride 0.9 % 215 mL chemo infusion  1,750 mg Intravenous Once Louretta Shorten R, MD       famotidine (PEPCID) IVPB 20 mg premix  20 mg Intravenous Once Louretta Shorten R, MD 200 mL/hr at 08/15/23 1006 20 mg at 08/15/23 1006   heparin lock flush 100 UNIT/ML injection            heparin lock flush 100 UNIT/ML injection  heparin lock flush 100 unit/mL  500 Units Intravenous Once Borders, Ivin Booty R, NP       sodium chloride flush (NS) 0.9 % injection 10 mL  10 mL Intravenous PRN Louretta Shorten R, MD   10 mL at 07/28/21 0852   sodium chloride flush (NS) 0.9 % injection 10 mL  10 mL Intravenous Once Borders, Daryl Eastern, NP          .  PHYSICAL EXAMINATION: ECOG PERFORMANCE STATUS: 1 - Symptomatic but completely ambulatory  Vitals:   08/15/23 0818  BP: 118/87  Pulse: 90  Temp: (!) 96.9 F (36.1 C)  SpO2: 97%    Filed Weights   08/15/23 0818  Weight: 202 lb 3.2 oz (91.7 kg)      Physical Exam HENT:     Head: Normocephalic and atraumatic.     Mouth/Throat:     Pharynx: No oropharyngeal exudate.  Eyes:     Pupils: Pupils are equal, round, and reactive to light.  Cardiovascular:     Rate and Rhythm: Normal rate and regular rhythm.  Pulmonary:     Effort: No respiratory distress.     Breath sounds: No wheezing.     Comments: Decreased breath sound on the left side compared to right. Abdominal:     General: Bowel sounds are normal. There is no distension.     Palpations:  Abdomen is soft. There is no mass.     Tenderness: There is no abdominal tenderness. There is no guarding or rebound.  Musculoskeletal:        General: No tenderness. Normal range of motion.     Cervical back: Normal range of motion and neck supple.  Skin:    General: Skin is warm.  Neurological:     Mental Status: He is alert and oriented to person, place, and time.  Psychiatric:        Mood and Affect: Affect normal.    Fungal dermatitis bilateral buttocks groin/underneath abdominal pannus  LABORATORY DATA:  I have reviewed the data as listed Lab Results  Component Value Date   WBC 2.5 (L) 08/15/2023   HGB 14.6 08/15/2023   HCT 42.6 08/15/2023   MCV 95.7 08/15/2023   PLT 63 (L) 08/15/2023   Recent Labs    08/06/23 0832 08/13/23 1053 08/15/23 0821  NA 135 135 134*  K 3.7 3.9 3.5  CL 103 101 101  CO2 25 25 26   GLUCOSE 103* 115* 107*  BUN 7 17 11   CREATININE 0.72 0.75 0.55*  CALCIUM 8.7* 8.8* 8.4*  GFRNONAA >60 >60 >60  PROT 6.8 7.1 6.1*  ALBUMIN 3.1* 3.1* 2.8*  AST 152* 155* 140*  ALT 90* 118* 109*  ALKPHOS 329* 384* 333*  BILITOT 1.0 2.5* 2.3*  2.3*  BILIDIR  --   --  0.9*  IBILI  --   --  1.4*    RADIOGRAPHIC STUDIES: I have personally reviewed the radiological images as listed and agreed with the findings in the report. US Paracentesis  Result Date: 08/14/2023 INDICATION: Patient with metastatic lung cancer presents today with ascites. Therapeutic paracentesis requested. EXAM: ULTRASOUND GUIDED PARACENTESIS MEDICATIONS: 1% lidocaine 10 mL COMPLICATIONS: None immediate. PROCEDURE: Informed written consent was obtained from the patient after a discussion of the risks, benefits and alternatives to treatment. A timeout was performed prior to the initiation of the procedure. Initial ultrasound scanning demonstrates a large amount of ascites within the right lower abdominal quadrant. The right lower abdomen was prepped and draped in  the usual sterile fashion. 1%  lidocaine was used for local anesthesia. Following this, a 19 gauge, 7-cm, Yueh catheter was introduced. An ultrasound image was saved for documentation purposes. The paracentesis was performed. The catheter was removed and a dressing was applied. The patient tolerated the procedure well without immediate post procedural complication. FINDINGS: A total of approximately 5.2 L of amber colored fluid was removed. IMPRESSION: Successful ultrasound-guided paracentesis yielding 5.2 liters of peritoneal fluid. Procedure performed by Alwyn Ren NP Electronically Signed   By: Olive Bass M.D.   On: 08/14/2023 15:26   CT ABDOMEN PELVIS W CONTRAST  Result Date: 08/13/2023 CLINICAL DATA:  Left-sided abdominal pain metastatic disease distension EXAM: CT ABDOMEN AND PELVIS WITH CONTRAST TECHNIQUE: Multidetector CT imaging of the abdomen and pelvis was performed using the standard protocol following bolus administration of intravenous contrast. RADIATION DOSE REDUCTION: This exam was performed according to the departmental dose-optimization program which includes automated exposure control, adjustment of the mA and/or kV according to patient size and/or use of iterative reconstruction technique. CONTRAST:  OMNIPAQUE IOHEXOL 300 MG/ML  SOLN COMPARISON:  PET CT 07/16/2023, CT abdomen pelvis 05/27/2023, CT 06/27/2021, MRI 09/04/2022 FINDINGS: Lower chest: Lung bases demonstrate incompletely visualized chronic large loculated left pleural effusion. Partial left lower lobe consolidation. Hepatobiliary: Atrophic hypodense appearing left hepatic lobe and irregular heterogenous hypodensity in the posterior right hepatic lobe, corresponding to the patient's history of metastatic disease. Liver appears grossly nodular consistent with cirrhotic morphology. No calcified gallstone or biliary dilatation. Findings appear slightly progressive compared with CT from August. Pancreas: Unremarkable. No pancreatic ductal dilatation  or surrounding inflammatory changes. Spleen: Upper normal in size measuring 13 cm oblique craniocaudad. Adrenals/Urinary Tract: Adrenal glands are normal. Kidneys show no hydronephrosis. The bladder is unremarkable Stomach/Bowel: The stomach is nonenlarged. No dilated small bowel. No acute bowel wall thickening. Vascular/Lymphatic: Nonaneurysmal aorta. No suspicious lymph nodes. 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 Main and right portal veins appear patent. Reproductive: Prostate is unremarkable. Other: No free air.  Interval large volume abdominopelvic ascites. Musculoskeletal: Scattered subtle foci of lytic and sclerotic change within the spine and pelvis, corresponding to history of treated skeletal metastatic disease. IMPRESSION: 1. Interval large volume abdominopelvic ascites. 2. Cirrhotic morphology of the liver with atrophic left hepatic lobe and irregular heterogenous hypodensity in the posterior right hepatic lobe, corresponding to the patient's known history of metastatic disease. Findings appear slightly progressive compared with CT from August. 3. 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 4. Incompletely visualized chronic large loculated left pleural effusion with partial left lower lobe consolidation. 5. No acute osseous abnormality. No new suspicious bone lesion by CT. Electronically Signed   By: Jasmine Pang M.D.   On: 08/13/2023 20:50   DG Chest 2 View  Result Date: 08/13/2023 CLINICAL DATA:  History of pleural effusion in lung cancer EXAM: CHEST - 2 VIEW COMPARISON:  05/28/2023 FINDINGS: Right chest port with the catheter tip at superior cavoatrial junction. Unchanged cardiac and mediastinal contours. Large left pleural effusion, overall unchanged from prior exam. No new focal pulmonary opacity in the right lung. No right pleural effusion. No pneumothorax bilaterally. No acute osseous abnormality. IMPRESSION: Large left pleural effusion, overall unchanged from prior exam.  Electronically Signed   By: Wiliam Ke M.D.   On: 08/13/2023 11:40   MR BRAIN W WO CONTRAST  Result Date: 07/30/2023 CLINICAL DATA:  42 year old male with metastatic lung cancer. Progression of numerous subcentimeter enhancing lesions in May, subsequently regressed in July.  Immunotherapy. Progressive liver metastases on PET-CT earlier this month. Restaging. EXAM: MRI HEAD WITHOUT AND WITH CONTRAST TECHNIQUE: Multiplanar, multiecho pulse sequences of the brain and surrounding structures were obtained without and with intravenous contrast. CONTRAST:  10 mL Vueway COMPARISON:  04/23/2023 and earlier. FINDINGS: Brain: Overall black blood postcontrast imaging reveals no disease progression compared to 02/18/2023. Overall few are subcentimeter enhancing lesions since that time, and those which are visible are stable since May. Multiple small lesions in the right parietal and occipital lobes are no longer visible. Most other lesions have not enlarged. A range from punctate (left temporal lobe series 13, image 46) up to approximately 12 mm (medial anterior right temporal lobe image 47). Subtle increased conspicuity of the cerebellar folia on postcontrast black blood (especially series 13, image 49), but this is not correlated with convincing leptomeningeal thickening on the other postcontrast sequences (series 15, image 14) and is felt to be artifact. Approximately a dozen lesions remain visible on series 13 (annotated), and not all of these are apparent on MPRAGE. Occasional associated T2 and FLAIR hyperintensity (left frontal operculum, middle frontal gyrus) is unchanged since May. Stable cerebral volume. No restricted diffusion to suggest acute infarction. No midline shift, mass effect, ventriculomegaly, extra-axial collection or acute intracranial hemorrhage. Cervicomedullary junction and pituitary are within normal limits. No new signal abnormality. Occasional post treatment hemosiderin of the lesions is  stable. Vascular: Major intracranial vascular flow voids are stable. Dominant distal right vertebral artery. Skull and upper cervical spine: Negative visible cervical spine and spinal cord. Visualized bone marrow signal is within normal limits. Sinuses/Orbits: Stable, negative. Other: Mastoids remain clear. Visible internal auditory structures appear normal. IMPRESSION: Stable post treatment appearance of the brain since 02/18/2023: - approximately a dozen small enhancing metastases remain visible (series 13) ranging from punctate to 12 mm. No significant edema or mass effect. - no new metastasis identified; subtle increased conspicuity of cerebellum leptomeninges on only series 13 is felt to be artifact. Attention on follow-up. Electronically Signed   By: Odessa Fleming M.D.   On: 07/30/2023 10:54    ASSESSMENT & PLAN:   Cancer of lower lobe of left lung (HCC) # STAGE IV- [JUNE 2022] Left lower lobe lung cancer-adenocarcinoma the lung;  EGFR- 19 del POSITIVE. s /p second opinion at [facility]- Dr.Stinchcomb.  Since MAY 2024- Alimta+ M-vasi  s/p 7- cyles-    OCT 1st, 2024- . Widespread hypermetabolic poorly marginated bilobar liver metastases, increased in size and metabolism since 04/22/2023 PET-CT; New hypermetabolic lytic posterior T3 vertebral metastasis;  New hypermetabolic subcentimeter bilateral level 2 neck lymph nodes, indeterminate for reactive versus metastatic nodes.Interlobular septal thickening throughout the right lung is mildly increased, cannot exclude lymphangitic carcinomatosis. Stable mild patchy hypermetabolism associated with bandlike chronic consolidation in the left lower lobe, favoring inflammatory uptake. Chronic loculated large left pleural effusion with smooth left pleural thickening;  New small volume ascites.    # Started on [place], Alimta and amivantamab on 08/07/2023.  Labs reviewed.  WBC 2.3, ANC 1.8, platelets 63.  AST and ALT elevated but stable.  Total bilirubin 2.3.  Cytopenias are  likely related to chemotherapy.  Dosing reviewed.  Does not need any dose adjustment of amivantamab with elevated bilirubin level.  Will proceed with the treatment as planned.  Follow-up in 1 week with Dr. B for cycle 1 day 15.   # Abdominal discomfort-CT abdomen pelvis showing large volume ascites. S/p paracentesis on 08/14/2023.  5.2 L fluid removed.  Still having discomfort on the  left side.  Nothing obvious on the CT.  He wants to hold off on pain medications right now.   # Eye burning/tearing- on Patanol eye drop BID-stable.    #Asymptomatic multiple brain mets subcentimeter asymptomatic-  -s/p SBRT SEP 2023- [[place]].  S/p SBRT [[place]]- FEB 12th, 2024. July 9th, 2024-  Stable clinically. Decreased conspicuity of all enhancing lesions with some lesions described on the prior no longer visible and others less visible. No new lesions identified  S/p evaluation with neuro-oncology- stable.  MRI brain from 07/30/2023 showed stable brain mets.  Has follow-up scheduled with Dr. Barbaraann Cao tomorrow.   # bone metastases-Hypocalcemia: mild- continue calcium 1200 plus vitamin D-3 1000-over-the-counter-1 pill a day.  APRIL 2024-vit D 25-OH-28- on Ergocalciferol progressive disease as above.  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 Continue dose of Eliquis 2.5 mg BID.[given nose bleeds]- see above.stable.    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    # IV access/ port malfunction-s/p tPA.    [Tuesday/wed]- pref B12  on 8/27   # DISPOSITION:  # Proceed with Amivantamab today. RTC in 1 week with Dr. B, labs, cycle 1 day 15 of Ami as scheduled.       All questions were answered. The patient knows to call the clinic with any  problems, questions or concerns.    Michaelyn Barter, MD 08/15/2023 10:18 AM

## 2023-08-15 NOTE — Progress Notes (Signed)
[facility] Care Clinical Summary 2024-09 Name: Brekken, Zwack DOB: 04/14/1981 Patient MRN: [id number]  Referring Provider: Laurette Schimke [facility] Name: Marissa Nestle Date of Last Psychiatric Consultant Review: 2023-07-02  Completed Behavioral Health Coach Visits  Completed [facility] Visits  06/26/23 07/04/23 07/11/23  Total Care Time: 107 minutes  Diagnosis(es): F43.20: Adjustment disorder, unspecified  Most Recent Assessments  Depression Assessment (PHQ-9) 2 Anxiety Assessment (GAD-7) 2 PTSD Checklist Assessment (PCL-5) NA BAM - Use NA BAM - Risk Factor NA BAM - Protective Factor NA  Current Medications (Name, Dose) Sertraline HCl Oral Tablet - 50 MG  No changes to psychiatric medication recommendations. No new action requested. Please reference last Care Plan for most up-to-date behavioral health recommendations.  Safety Plan: No Safety Plan Date: N/A

## 2023-08-15 NOTE — Patient Instructions (Signed)
[place] [facility]  Discharge Instructions: Thank you for choosing [facility] to provide your oncology and hematology care.  If you have a lab appointment with the Cancer Center, please go directly to the Cancer Center and check in at the registration area.  Wear comfortable clothing and clothing appropriate for easy access to any Portacath or PICC line.   We strive to give you quality time with your provider. You may need to reschedule your appointment if you arrive late (15 or more minutes).  Arriving late affects you and other patients whose appointments are after yours.  Also, if you miss three or more appointments without notifying the office, you may be dismissed from the clinic at the provider's discretion.      For prescription refill requests, have your pharmacy contact our office and allow 72 hours for refills to be completed.    Today you received the following chemotherapy and/or immunotherapy agents Rybrevant      To help prevent nausea and vomiting after your treatment, we encourage you to take your nausea medication as directed.  BELOW ARE SYMPTOMS THAT SHOULD BE REPORTED IMMEDIATELY: *FEVER GREATER THAN 100.4 F (38 C) OR HIGHER *CHILLS OR SWEATING *NAUSEA AND VOMITING THAT IS NOT CONTROLLED WITH YOUR NAUSEA MEDICATION *UNUSUAL SHORTNESS OF BREATH *UNUSUAL BRUISING OR BLEEDING *URINARY PROBLEMS (pain or burning when urinating, or frequent urination) *BOWEL PROBLEMS (unusual diarrhea, constipation, pain near the anus) TENDERNESS IN MOUTH AND THROAT WITH OR WITHOUT PRESENCE OF ULCERS (sore throat, sores in mouth, or a toothache) UNUSUAL RASH, SWELLING OR PAIN  UNUSUAL VAGINAL DISCHARGE OR ITCHING   Items with * indicate a potential emergency and should be followed up as soon as possible or go to the Emergency Department if any problems should occur.  Please show the CHEMOTHERAPY ALERT CARD or IMMUNOTHERAPY ALERT CARD at check-in to  the Emergency Department and triage nurse.  Should you have questions after your visit or need to cancel or reschedule your appointment, please contact [place] [facility]  [phone] and follow the prompts.  Office hours are 8:00 a.m. to 4:30 p.m. Monday - Friday. Please note that voicemails left after 4:00 p.m. may not be returned until the following business day.  We are closed weekends and major holidays. You have access to a nurse at all times for urgent questions. Please call the main number to the clinic [phone] and follow the prompts.  For any non-urgent questions, you may also contact your provider using MyChart. We now offer e-Visits for anyone 82 and older to request care online for non-urgent symptoms. For details visit [email or web address].   Also download the MyChart app! Go to the app store, search "MyChart", open the app, select [place], and log in with your MyChart username and password.

## 2023-08-16 ENCOUNTER — Inpatient Hospital Stay: Payer: Managed Care, Other (non HMO) | Attending: Internal Medicine | Admitting: Internal Medicine

## 2023-08-16 ENCOUNTER — Encounter: Payer: Self-pay | Admitting: Internal Medicine

## 2023-08-16 ENCOUNTER — Encounter: Payer: Self-pay | Admitting: *Deleted

## 2023-08-16 VITALS — BP 133/88 | HR 81 | Temp 96.1°F | Ht 71.0 in | Wt 205.3 lb

## 2023-08-16 DIAGNOSIS — C7931 Secondary malignant neoplasm of brain: Secondary | ICD-10-CM | POA: Diagnosis not present

## 2023-08-16 DIAGNOSIS — Z5111 Encounter for antineoplastic chemotherapy: Secondary | ICD-10-CM | POA: Insufficient documentation

## 2023-08-16 DIAGNOSIS — Z5189 Encounter for other specified aftercare: Secondary | ICD-10-CM | POA: Insufficient documentation

## 2023-08-16 DIAGNOSIS — I81 Portal vein thrombosis: Secondary | ICD-10-CM | POA: Insufficient documentation

## 2023-08-16 DIAGNOSIS — C7951 Secondary malignant neoplasm of bone: Secondary | ICD-10-CM | POA: Insufficient documentation

## 2023-08-16 DIAGNOSIS — R21 Rash and other nonspecific skin eruption: Secondary | ICD-10-CM | POA: Insufficient documentation

## 2023-08-16 DIAGNOSIS — C787 Secondary malignant neoplasm of liver and intrahepatic bile duct: Secondary | ICD-10-CM | POA: Diagnosis not present

## 2023-08-16 DIAGNOSIS — J90 Pleural effusion, not elsewhere classified: Secondary | ICD-10-CM | POA: Diagnosis not present

## 2023-08-16 DIAGNOSIS — D701 Agranulocytosis secondary to cancer chemotherapy: Secondary | ICD-10-CM | POA: Diagnosis not present

## 2023-08-16 DIAGNOSIS — R188 Other ascites: Secondary | ICD-10-CM | POA: Insufficient documentation

## 2023-08-16 DIAGNOSIS — Z7901 Long term (current) use of anticoagulants: Secondary | ICD-10-CM | POA: Diagnosis not present

## 2023-08-16 DIAGNOSIS — Z5112 Encounter for antineoplastic immunotherapy: Secondary | ICD-10-CM | POA: Insufficient documentation

## 2023-08-16 DIAGNOSIS — Z452 Encounter for adjustment and management of vascular access device: Secondary | ICD-10-CM | POA: Diagnosis not present

## 2023-08-16 DIAGNOSIS — C3432 Malignant neoplasm of lower lobe, left bronchus or lung: Secondary | ICD-10-CM | POA: Diagnosis present

## 2023-08-16 NOTE — Progress Notes (Signed)
C/o red rash on his nose and chin, will bleed if messed with.

## 2023-08-16 NOTE — Progress Notes (Signed)
Pt seen in clinic after his visit with Dr. Barbaraann Cao. Pt stated that has noticed a new rash to his face around his nose and mouth. Per Dr. Alena Bills, pt may try OTC hydrocortisone cream and reassess at his next visit with Dr. Leonard Schwartz. Pt made aware and informed to call if rash worsens. Advised pt to use facial moisturizer and sunscreen twice daily to help prevent further skin irritation. Pt verbalized understanding. Nothing further needed.

## 2023-08-16 NOTE — Progress Notes (Signed)
[facility] at [facility] [address]  [place], [place] [address] [phone]   Interval Evaluation  Date of Service: 08/16/23 Patient Name: KAVAUGHN FAUCETT Patient MRN: [id number] Patient DOB: 1981/01/03 Provider: Henreitta Leber, MD  Identifying Statement:  LUCAS WINOGRAD is a 42 y.o. male with Malignant neoplasm metastatic to brain [facility])   Primary Cancer:  Oncologic History: Oncology History Overview Note  IMPRESSION: 1. Patchy nodular fat stranding throughout the anterior left upper quadrant peritoneal fat, nonspecific, cannot exclude peritoneal carcinomatosis. Dedicated CT abdomen/pelvis with oral and IV contrast recommended for further evaluation. 2. Dense patchy consolidation replacing much of the left lower lung lobe, appearing masslike in the superior segment left lower lobe, with associated bulging of the left major fissure and associated left lower lobe volume loss. Fine nodularity throughout both lungs with an upper lobe predominance. Asymmetric left upper lobe interlobular septal thickening. These findings are indeterminate, with differential including multilobar pneumonia, sarcoidosis or a neoplastic process. The persistence on radiographs back to 01/20/2021 despite antibiotic therapy make sarcoidosis or a neoplastic process more likely. Pulmonology consultation suggested for consideration of bronchoscopic evaluation. 3. Small dependent left pleural effusion. 4. Mild mediastinal lymphadenopathy, nonspecific. 5. Subacute healing lateral right sixth rib fracture.   DIAGNOSIS:  A. LUNG, LEFT LOWER LOBE; ENB-ASSISTED BIOPSY:  - NON-SMALL CELL CARCINOMA, FAVOR ADENOCARCINOMA.  - FOREIGN MATERIAL SUGGESTIVE OF ASPIRATION.   # EGFR MUTATED: 68 del- June 28th, 2022- [name]. [limited]  # Asymptomatic multiple brain mets subcentimeter asymptomatic -JUNE 29th, 2023- Numerous metastatic lesions in the supratentorial brain-increase in  number also conspicuity compared to January 2023.  Pre-SBRT MRI brain AUG 18th, 2023-  Eighteen small enhancing brain metastases (punctate to 11 mm); were all present on 03/28/2021-s/p SBRT SEP 2023- [[place]]; SRS/ radiation treatment for brain metastasis He finished treatment on 11-26-22.   # SEP-OCT-worsening left-sided pleural effusion-status post paracentesis; exudative; Cytology negative- ?  Progressive disease.  # NOV 2023- JAN 2024 CARBO-TAXOL-BEV+ATEZO x4 cyles- BEV+ATEZO- until APRIL 2024  # STAGE IV- [JUNE 2022] Left lower lobe lung cancer-adenocarcinoma the lung;  EGFR- 19 del POSITIVE.  While on osimertinib- NOV 1st, 2023- PET scan- shows progressive disease in liver. Liquid Biopsy/Guardant testing-positive for EGFR mutation; otherwise Negative for any novel mutations.  Repeat liver Biopsy positive for adenocarcinoma. NEGATIVE for MET amplification.  S/p second opinion at [facility]- Dr.Stinchcomb. status post 4 cycles of carbo Taxol-Atezo+Bev- JAN 25th, 2024-PET- Interval response to therapy with resolution of previously demonstrated extensive hypermetabolic activity throughout the liver. PARIL 2024- ? Partial response, but clinically worse/rising LFTs  # MAY 6th, Brain MRI-progressive multiple brain metastases  # MAY 16th, 2024- Alimta+Avastin #1    Cancer of lower lobe of left lung (HCC)  03/23/2021 Initial Diagnosis   Cancer of lower lobe of left lung (HCC)   03/29/2021 Cancer Staging   Staging form: Lung, AJCC 8th Edition - Clinical: Stage IVB (cT3, cN3, pM1c) - Signed by [name], MD on 03/29/2021   03/30/2021 - 03/30/2021 Chemotherapy   Patient is on Treatment Plan : LUNG NSCLC Pemetrexed (Alimta) / Carboplatin q21d x 1 cycles     08/17/2022 - 01/15/2023 Chemotherapy   Patient is on Treatment Plan : LUNG Atezolizumab + Bevacizumab + Carboplatin + Paclitaxel q21d Induction x 4 cycles / Atezolizumab + Bevacizumab q21d Maintenance     02/28/2023 - 07/02/2023 Chemotherapy   Patient  is on Treatment Plan : LUNG NON-SMALL CELL Bevacizumab + Pemetrexed q21d  08/07/2023 -  Chemotherapy   Patient is on Treatment Plan : LUNG NSCLC Pemetrexed + Carboplatin + Amivantamab q21d x 4 Cycles / Pemetrexed + Amivantamab q21d      CNS Oncologic History 06/05/22: SRS x18 [facility]) 11/26/22: SRS x13 [facility])  Interval History: ITHAN TOUHEY presents today for follow up after recent MRI brain.  Only mild recent headaches.  He otherwise describes no new or progressive neurologic changes.  No further episodes of severe fatigue described previously.  He is now on Carboplatin, Alimta and Amivantanab for recently progressive NSCLC.    Prior- He describes bout of severe fatigue, coming on suddenly and lasting "about 2 weeks" recently having resolved.  He has been dosing prednisone 100mg  daily without ill effect.  Last immunotherapy infusion was in early April, most recent rx having been held due to the fatigue symptoms.    H+P (05/03/22) Patient presents today to review recent finding of progressive brain metastases from lung cancer.  Mets were originally identified in June 2022, he was asymptomatic at that time.  Decision was made to monitor with serial imaging given EGFR status and utilization of tagrisso through Dr. Donneta Romberg.  Recent MRI demonstrates progression of disease in the brain, but he remains asymptomatic.    Medications: Current Outpatient Medications on File Prior to Visit  Medication Sig Dispense Refill   albuterol (VENTOLIN HFA) 108 (90 Base) MCG/ACT inhaler Inhale 2 puffs into the lungs every 6 (six) hours as needed for wheezing or shortness of breath. 8 g 2   apixaban (ELIQUIS) 2.5 MG TABS tablet Take 1 tablet (2.5 mg total) by mouth 2 (two) times daily. 60 tablet 4   calcium carbonate (TUMS EX) 750 MG chewable tablet Chew 1 tablet by mouth daily.     folic acid (FOLVITE) 1 MG tablet TAKE 1 TABLET BY MOUTH EVERY DAY 90 tablet 1   lidocaine-prilocaine (EMLA) cream Apply 1  Application topically as needed. 30 g 0   metoprolol succinate (TOPROL-XL) 50 MG 24 hr tablet Take 50 mg by mouth daily. Take with or immediately following a meal.     montelukast (SINGULAIR) 10 MG tablet TAKE 1 TABLET BY MOUTH EVERYDAY AT BEDTIME 90 tablet 1   OLANZapine (ZYPREXA) 5 MG tablet Take 1 tablet (5 mg total) by mouth at bedtime as needed (nausea). 30 tablet 0   ondansetron (ZOFRAN-ODT) 4 MG disintegrating tablet Take 1 tablet (4 mg total) by mouth every 8 (eight) hours as needed for nausea or vomiting. 20 tablet 1   sertraline (ZOLOFT) 50 MG tablet Take 1 tablet (50 mg total) by mouth daily. 30 tablet 1   traMADol (ULTRAM) 50 MG tablet Take 1 tablet (50 mg total) by mouth every 12 (twelve) hours as needed. 30 tablet 0   Vitamin D, Ergocalciferol, (DRISDOL) 1.25 MG (50000 UNIT) CAPS capsule TAKE 1 CAPSULE BY MOUTH ONE TIME PER WEEK 12 capsule 1   Current Facility-Administered Medications on File Prior to Visit  Medication Dose Route Frequency Provider Last Rate Last Admin   heparin lock flush 100 UNIT/ML injection            heparin lock flush 100 UNIT/ML injection            heparin lock flush 100 unit/mL  500 Units Intravenous Once Borders, Ivin Booty R, NP       sodium chloride flush (NS) 0.9 % injection 10 mL  10 mL Intravenous PRN [name], MD   10 mL at 07/28/21 [phone]  sodium chloride flush (NS) 0.9 % injection 10 mL  10 mL Intravenous Once Borders, Daryl Eastern, NP        Allergies:  Allergies  Allergen Reactions   Codeine     Makes pt hyper   Past Medical History:  Past Medical History:  Diagnosis Date   Cancer (HCC)    Dyspnea    Heartburn    Pneumonia    Wolff-Parkinson-White (WPW) 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   Past Surgical History:  Past Surgical History:  Procedure Laterality Date   FRACTURE SURGERY     broke femur when he was 6 years   PORTA CATH INSERTION N/A 03/28/2021   Procedure: PORTA CATH INSERTION;  Surgeon: Renford Dills, MD;  Location:  [facility] INVASIVE CV LAB;  Service: Cardiovascular;  Laterality: N/A;   VIDEO BRONCHOSCOPY WITH ENDOBRONCHIAL NAVIGATION N/A 03/15/2021   Procedure: VIDEO BRONCHOSCOPY WITH ENDOBRONCHIAL NAVIGATION;  Surgeon: Vida Rigger, MD;  Location: [facility] ORS;  Service: Thoracic;  Laterality: N/A;   VIDEO BRONCHOSCOPY WITH ENDOBRONCHIAL ULTRASOUND N/A 03/15/2021   Procedure: VIDEO BRONCHOSCOPY WITH ENDOBRONCHIAL ULTRASOUND;  Surgeon: Vida Rigger, MD;  Location: [facility] ORS;  Service: Thoracic;  Laterality: N/A;   Social History:  Social History   Socioeconomic History   Marital status: Married    Spouse name: Darl Pikes   Number of children: 2   Years of education: Not on file   Highest education level: Not on file  Occupational History   Not on file  Tobacco Use   Smoking status: Never   Smokeless tobacco: Former    Types: Snuff, Chew  Vaping Use   Vaping status: Never Used  Substance and Sexual Activity   Alcohol use: Never   Drug use: Never   Sexual activity: Yes  Other Topics Concern   Not on file  Social History Narrative   [place]; with wife; 2 daughters[12 and 22]; never smoked; rare alcohol. 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 Dog and cat.   Social Determinants of Health   Financial Resource Strain: Low Risk  (05/10/2022)   Overall Financial Resource Strain (CARDIA)    Difficulty of Paying Living Expenses: Not hard at all  Food Insecurity: No Food Insecurity (05/10/2022)   Hunger Vital Sign    Worried About Running Out of Food in the Last Year: Never true    Ran Out of Food in the Last Year: Never true  Transportation Needs: No Transportation Needs (03/16/2022)   PRAPARE - Administrator, Civil Service (Medical): No    Lack of Transportation (Non-Medical): No  Physical Activity: Not on file  Stress: Not on file  Social Connections: Socially Integrated (05/10/2022)   Social Connection and Isolation Panel [NHANES]    Frequency of Communication with Friends and Family: More than three  times a week    Frequency of Social Gatherings with Friends and Family: More than three times a week    Attends Religious Services: More than 4 times per year    Active Member of [employer] or Organizations: Yes    Attends Engineer, structural: More than 4 times per year    Marital Status: Married  Catering manager Violence: Not on file   Family History:  Family History  Problem Relation Age of Onset   High Cholesterol Mother    Hypertension Father    Lung cancer Father    Skin cancer Father     Review of Systems: Constitutional: Doesn't report fevers, chills or abnormal weight  loss Eyes: Doesn't report blurriness of vision Ears, nose, mouth, throat, and face: Doesn't report sore throat Respiratory: Doesn't report cough, dyspnea or wheezes Cardiovascular: Doesn't report palpitation, chest discomfort  Gastrointestinal:  Doesn't report nausea, constipation, diarrhea GU: Doesn't report incontinence Skin: Doesn't report skin rashes Neurological: Per HPI Musculoskeletal: Doesn't report joint pain Behavioral/Psych: Doesn't report anxiety  Physical Exam: Vitals:   08/16/23 0932  BP: 133/88  Pulse: 81  Temp: (!) 96.1 F (35.6 C)  SpO2: 99%    KPS: 90. General: Alert, cooperative, pleasant, in no acute distress Head: Normal EENT: No conjunctival injection or scleral icterus.  Lungs: Resp effort normal Cardiac: Regular rate Abdomen: Non-distended abdomen Skin: No rashes cyanosis or petechiae. Extremities: No clubbing or edema  Neurologic Exam: Mental Status: Awake, alert, attentive to examiner. Oriented to self and environment. Language is fluent with intact comprehension.  Cranial Nerves: Visual acuity is grossly normal. Visual fields are full. Extra-ocular movements intact. No ptosis. Face is symmetric Motor: Tone and bulk are normal. Power is full in both arms and legs. Reflexes are symmetric, no pathologic reflexes present.  Sensory: Intact to light touch Gait:  Normal.   Labs: I have reviewed the data as listed    Component Value Date/Time   NA 134 (L) 08/15/2023 0821   K 3.5 08/15/2023 0821   CL 101 08/15/2023 0821   CO2 26 08/15/2023 0821   GLUCOSE 107 (H) 08/15/2023 0821   BUN 11 08/15/2023 0821   CREATININE 0.55 (L) 08/15/2023 0821   CALCIUM 8.4 (L) 08/15/2023 0821   PROT 6.1 (L) 08/15/2023 0821   ALBUMIN 2.8 (L) 08/15/2023 0821   AST 140 (H) 08/15/2023 0821   ALT 109 (H) 08/15/2023 0821   ALKPHOS 333 (H) 08/15/2023 0821   BILITOT 2.3 (H) 08/15/2023 0821   BILITOT 2.3 (H) 08/15/2023 0821   GFRNONAA >60 08/15/2023 0821   Lab Results  Component Value Date   WBC 2.5 (L) 08/15/2023   NEUTROABS 1.8 08/15/2023   HGB 14.6 08/15/2023   HCT 42.6 08/15/2023   MCV 95.7 08/15/2023   PLT 63 (L) 08/15/2023    Imaging:  US Paracentesis  Result Date: 08/14/2023 INDICATION: Patient with metastatic lung cancer presents today with ascites. Therapeutic paracentesis requested. EXAM: ULTRASOUND GUIDED PARACENTESIS MEDICATIONS: 1% lidocaine 10 mL COMPLICATIONS: None immediate. PROCEDURE: Informed written consent was obtained from the patient after a discussion of the risks, benefits and alternatives to treatment. A timeout was performed prior to the initiation of the procedure. Initial ultrasound scanning demonstrates a large amount of ascites within the right lower abdominal quadrant. The right lower abdomen was prepped and draped in the usual sterile fashion. 1% lidocaine was used for local anesthesia. Following this, a 19 gauge, 7-cm, Yueh catheter was introduced. An ultrasound image was saved for documentation purposes. The paracentesis was performed. The catheter was removed and a dressing was applied. The patient tolerated the procedure well without immediate post procedural complication. FINDINGS: A total of approximately 5.2 L of amber colored fluid was removed. IMPRESSION: Successful ultrasound-guided paracentesis yielding 5.2 liters of  peritoneal fluid. Procedure performed by Alwyn Ren NP Electronically Signed   By: Olive Bass M.D.   On: 08/14/2023 15:26   CT ABDOMEN PELVIS W CONTRAST  Result Date: 08/13/2023 CLINICAL DATA:  Left-sided abdominal pain metastatic disease distension EXAM: CT ABDOMEN AND PELVIS WITH CONTRAST TECHNIQUE: Multidetector CT imaging of the abdomen and pelvis was performed using the standard protocol following bolus administration of intravenous contrast. RADIATION DOSE REDUCTION:  This exam was performed according to the departmental dose-optimization program which includes automated exposure control, adjustment of the mA and/or kV according to patient size and/or use of iterative reconstruction technique. CONTRAST:  OMNIPAQUE IOHEXOL 300 MG/ML  SOLN COMPARISON:  PET CT 07/16/2023, CT abdomen pelvis 05/27/2023, CT 06/27/2021, MRI 09/04/2022 FINDINGS: Lower chest: Lung bases demonstrate incompletely visualized chronic large loculated left pleural effusion. Partial left lower lobe consolidation. Hepatobiliary: Atrophic hypodense appearing left hepatic lobe and irregular heterogenous hypodensity in the posterior right hepatic lobe, corresponding to the patient's history of metastatic disease. Liver appears grossly nodular consistent with cirrhotic morphology. No calcified gallstone or biliary dilatation. Findings appear slightly progressive compared with CT from August. Pancreas: Unremarkable. No pancreatic ductal dilatation or surrounding inflammatory changes. Spleen: Upper normal in size measuring 13 cm oblique craniocaudad. Adrenals/Urinary Tract: Adrenal glands are normal. Kidneys show no hydronephrosis. The bladder is unremarkable Stomach/Bowel: The stomach is nonenlarged. No dilated small bowel. No acute bowel wall thickening. Vascular/Lymphatic: Nonaneurysmal aorta. No suspicious lymph nodes. 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 Main and right portal veins appear patent. Reproductive: Prostate  is unremarkable. Other: No free air.  Interval large volume abdominopelvic ascites. Musculoskeletal: Scattered subtle foci of lytic and sclerotic change within the spine and pelvis, corresponding to history of treated skeletal metastatic disease. IMPRESSION: 1. Interval large volume abdominopelvic ascites. 2. Cirrhotic morphology of the liver with atrophic left hepatic lobe and irregular heterogenous hypodensity in the posterior right hepatic lobe, corresponding to the patient's known history of metastatic disease. Findings appear slightly progressive compared with CT from August. 3. 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 4. Incompletely visualized chronic large loculated left pleural effusion with partial left lower lobe consolidation. 5. No acute osseous abnormality. No new suspicious bone lesion by CT. Electronically Signed   By: Jasmine Pang M.D.   On: 08/13/2023 20:50   DG Chest 2 View  Result Date: 08/13/2023 CLINICAL DATA:  History of pleural effusion in lung cancer EXAM: CHEST - 2 VIEW COMPARISON:  05/28/2023 FINDINGS: Right chest port with the catheter tip at superior cavoatrial junction. Unchanged cardiac and mediastinal contours. Large left pleural effusion, overall unchanged from prior exam. No new focal pulmonary opacity in the right lung. No right pleural effusion. No pneumothorax bilaterally. No acute osseous abnormality. IMPRESSION: Large left pleural effusion, overall unchanged from prior exam. Electronically Signed   By: Wiliam Ke M.D.   On: 08/13/2023 11:40   MR BRAIN W WO CONTRAST  Result Date: 07/30/2023 CLINICAL DATA:  42 year old male with metastatic lung cancer. Progression of numerous subcentimeter enhancing lesions in May, subsequently regressed in July. Immunotherapy. Progressive liver metastases on PET-CT earlier this month. Restaging. EXAM: MRI HEAD WITHOUT AND WITH CONTRAST TECHNIQUE: Multiplanar, multiecho pulse sequences of the brain and surrounding structures were  obtained without and with intravenous contrast. CONTRAST:  10 mL Vueway COMPARISON:  04/23/2023 and earlier. FINDINGS: Brain: Overall black blood postcontrast imaging reveals no disease progression compared to 02/18/2023. Overall few are subcentimeter enhancing lesions since that time, and those which are visible are stable since May. Multiple small lesions in the right parietal and occipital lobes are no longer visible. Most other lesions have not enlarged. A range from punctate (left temporal lobe series 13, image 46) up to approximately 12 mm (medial anterior right temporal lobe image 47). Subtle increased conspicuity of the cerebellar folia on postcontrast black blood (especially series 13, image 49), but this is not correlated with convincing leptomeningeal thickening on the other  postcontrast sequences (series 15, image 14) and is felt to be artifact. Approximately a dozen lesions remain visible on series 13 (annotated), and not all of these are apparent on MPRAGE. Occasional associated T2 and FLAIR hyperintensity (left frontal operculum, middle frontal gyrus) is unchanged since May. Stable cerebral volume. No restricted diffusion to suggest acute infarction. No midline shift, mass effect, ventriculomegaly, extra-axial collection or acute intracranial hemorrhage. Cervicomedullary junction and pituitary are within normal limits. No new signal abnormality. Occasional post treatment hemosiderin of the lesions is stable. Vascular: Major intracranial vascular flow voids are stable. Dominant distal right vertebral artery. Skull and upper cervical spine: Negative visible cervical spine and spinal cord. Visualized bone marrow signal is within normal limits. Sinuses/Orbits: Stable, negative. Other: Mastoids remain clear. Visible internal auditory structures appear normal. IMPRESSION: Stable post treatment appearance of the brain since 02/18/2023: - approximately a dozen small enhancing metastases remain visible (series  13) ranging from punctate to 12 mm. No significant edema or mass effect. - no new metastasis identified; subtle increased conspicuity of cerebellum leptomeninges on only series 13 is felt to be artifact. Attention on follow-up. Electronically Signed   By: Odessa Fleming M.D.   On: 07/30/2023 10:54    [facility] Clinician Interpretation: I have personally reviewed the radiological images as listed.  My interpretation, in the context of the patient's clinical presentation, is stable disease   Assessment/Plan Malignant neoplasm metastatic to brain [facility])  PAX REASONER is clinically stable today.  MRI demonstrates stable findings, no progression or novel lesions.  His case will be reviewed again in CNS tumor board this week.  Recommendation was made to continue close surveillance.    We appreciate the opportunity to participate in the care of CAMPBELL KRAY.    We ask that GRABIEL SCHMUTZ return to clinic in 3 months following next brain MRI, or sooner as needed.  All questions were answered. The patient knows to call the clinic with any problems, questions or concerns. No barriers to learning were detected.  The total time spent in the encounter was 30 minutes and more than 50% was on counseling and review of test results   Henreitta Leber, MD Medical Director of Neuro-Oncology [facility] 08/16/23 9:42 AM

## 2023-08-20 ENCOUNTER — Ambulatory Visit: Payer: Managed Care, Other (non HMO) | Admitting: Internal Medicine

## 2023-08-20 ENCOUNTER — Ambulatory Visit: Payer: Managed Care, Other (non HMO)

## 2023-08-20 ENCOUNTER — Inpatient Hospital Stay: Payer: Managed Care, Other (non HMO) | Admitting: Licensed Clinical Social Worker

## 2023-08-20 ENCOUNTER — Other Ambulatory Visit: Payer: Managed Care, Other (non HMO)

## 2023-08-20 ENCOUNTER — Encounter: Payer: Self-pay | Admitting: Internal Medicine

## 2023-08-20 NOTE — Progress Notes (Signed)
[facility] CSW Progress Note  Clinical Social Worker contacted caregiver by phone to invite their family to the [place] event on 12/3.  She was on another call and requested more information.  CSW to meet with patient during his treatment on Thursday, 12/7.    Dorothey Baseman, LCSW Clinical Social Worker [facility]

## 2023-08-22 ENCOUNTER — Inpatient Hospital Stay: Payer: Managed Care, Other (non HMO)

## 2023-08-22 ENCOUNTER — Telehealth: Payer: Self-pay | Admitting: *Deleted

## 2023-08-22 ENCOUNTER — Encounter: Payer: Self-pay | Admitting: Internal Medicine

## 2023-08-22 ENCOUNTER — Inpatient Hospital Stay: Payer: Managed Care, Other (non HMO) | Admitting: Internal Medicine

## 2023-08-22 ENCOUNTER — Encounter: Payer: Self-pay | Admitting: Nurse Practitioner

## 2023-08-22 VITALS — BP 114/77 | HR 90 | Temp 96.3°F | Ht 71.0 in | Wt 210.2 lb

## 2023-08-22 DIAGNOSIS — C3432 Malignant neoplasm of lower lobe, left bronchus or lung: Secondary | ICD-10-CM

## 2023-08-22 DIAGNOSIS — R180 Malignant ascites: Secondary | ICD-10-CM | POA: Diagnosis not present

## 2023-08-22 DIAGNOSIS — E538 Deficiency of other specified B group vitamins: Secondary | ICD-10-CM

## 2023-08-22 DIAGNOSIS — Z5112 Encounter for antineoplastic immunotherapy: Secondary | ICD-10-CM | POA: Diagnosis not present

## 2023-08-22 LAB — CMP (CANCER CENTER ONLY)
ALT: 119 U/L — ABNORMAL HIGH (ref 0–44)
AST: 112 U/L — ABNORMAL HIGH (ref 15–41)
Albumin: 2.5 g/dL — ABNORMAL LOW (ref 3.5–5.0)
Alkaline Phosphatase: 324 U/L — ABNORMAL HIGH (ref 38–126)
Anion gap: 8 (ref 5–15)
BUN: 10 mg/dL (ref 6–20)
CO2: 27 mmol/L (ref 22–32)
Calcium: 7.8 mg/dL — ABNORMAL LOW (ref 8.9–10.3)
Chloride: 94 mmol/L — ABNORMAL LOW (ref 98–111)
Creatinine: 0.61 mg/dL (ref 0.61–1.24)
GFR, Estimated: >60 mL/min (ref >60)
Glucose, Bld: 103 mg/dL — ABNORMAL HIGH (ref 70–99)
Potassium: 3.2 mmol/L — ABNORMAL LOW (ref 3.5–5.1)
Sodium: 129 mmol/L — ABNORMAL LOW (ref 135–145)
Total Bilirubin: 1.1 mg/dL (ref <1.2)
Total Protein: 5.8 g/dL — ABNORMAL LOW (ref 6.5–8.1)

## 2023-08-22 LAB — CBC WITH DIFFERENTIAL (CANCER CENTER ONLY)
Abs Immature Granulocytes: 0.01 10*3/uL (ref 0.00–0.07)
Basophils Absolute: 0 10*3/uL (ref 0.0–0.1)
Basophils Relative: 1 %
Eosinophils Absolute: 0.1 10*3/uL (ref 0.0–0.5)
Eosinophils Relative: 3 %
HCT: 36 % — ABNORMAL LOW (ref 39.0–52.0)
Hemoglobin: 12.7 g/dL — ABNORMAL LOW (ref 13.0–17.0)
Immature Granulocytes: 1 %
Lymphocytes Relative: 60 %
Lymphs Abs: 0.9 10*3/uL (ref 0.7–4.0)
MCH: 32.4 pg (ref 26.0–34.0)
MCHC: 35.3 g/dL (ref 30.0–36.0)
MCV: 91.8 fL (ref 80.0–100.0)
Monocytes Absolute: 0.4 10*3/uL (ref 0.1–1.0)
Monocytes Relative: 26 %
Neutro Abs: 0.1 10*3/uL — CL (ref 1.7–7.7)
Neutrophils Relative %: 9 %
Platelet Count: 33 10*3/uL — ABNORMAL LOW (ref 150–400)
RBC: 3.92 MIL/uL — ABNORMAL LOW (ref 4.22–5.81)
RDW: 12.9 % (ref 11.5–15.5)
WBC Count: 1.5 10*3/uL — ABNORMAL LOW (ref 4.0–10.5)
nRBC: 0 % (ref 0.0–0.2)

## 2023-08-22 MED ORDER — TRIAMCINOLONE ACETONIDE 0.5 % EX OINT: 1.0000 | TOPICAL_OINTMENT | Freq: Two times a day (BID) | CUTANEOUS | 1 refills | Status: DC

## 2023-08-22 MED ORDER — FILGRASTIM-SNDZ 480 MCG/0.8ML IJ SOSY
480.0000 ug | PREFILLED_SYRINGE | Freq: Every day | INTRAMUSCULAR | Status: DC
  Administered 2023-08-22: 480 ug via SUBCUTANEOUS

## 2023-08-22 MED ORDER — HEPARIN SOD (PORK) LOCK FLUSH 100 UNIT/ML IV SOLN
500.0000 [IU] | Freq: Once | INTRAVENOUS | Status: AC
Start: 2023-08-22 — End: 2023-08-22
  Administered 2023-08-22: 500 [IU] via INTRAVENOUS
  Filled 2023-08-22: qty 5

## 2023-08-22 NOTE — Progress Notes (Signed)
Critical lab result from Enzo Bi in lab. ANC 0.1 Readback. Dr B notified

## 2023-08-22 NOTE — Progress Notes (Signed)
[facility] CONSULT NOTE  Patient Care Team: Pcp, No as PCP - General [name], RN as Oncology Nurse Navigator [name], MD as Consulting Physician (Internal Medicine)  CHIEF COMPLAINTS/PURPOSE OF CONSULTATION: Lung cancer  #  Oncology History Overview Note  IMPRESSION: 1. Patchy nodular fat stranding throughout the anterior left upper quadrant peritoneal fat, nonspecific, cannot exclude peritoneal carcinomatosis. Dedicated CT abdomen/pelvis with oral and IV contrast recommended for further evaluation. 2. Dense patchy consolidation replacing much of the left lower lung lobe, appearing masslike in the superior segment left lower lobe, with associated bulging of the left major fissure and associated left lower lobe volume loss. Fine nodularity throughout both lungs with an upper lobe predominance. Asymmetric left upper lobe interlobular septal thickening. These findings are indeterminate, with differential including multilobar pneumonia, sarcoidosis or a neoplastic process. The persistence on radiographs back to 01/20/2021 despite antibiotic therapy make sarcoidosis or a neoplastic process more likely. Pulmonology consultation suggested for consideration of bronchoscopic evaluation. 3. Small dependent left pleural effusion. 4. Mild mediastinal lymphadenopathy, nonspecific. 5. Subacute healing lateral right sixth rib fracture.   DIAGNOSIS:  A. LUNG, LEFT LOWER LOBE; ENB-ASSISTED BIOPSY:  - NON-SMALL CELL CARCINOMA, FAVOR ADENOCARCINOMA.  - FOREIGN MATERIAL SUGGESTIVE OF ASPIRATION.   # EGFR MUTATED: 43 del- June 28th, 2022- [name]. [limited]  # Asymptomatic multiple brain mets subcentimeter asymptomatic -JUNE 29th, 2023- Numerous metastatic lesions in the supratentorial brain-increase in number also conspicuity compared to January 2023.  Pre-SBRT MRI brain AUG 18th, 2023-  Eighteen small enhancing brain metastases (punctate to 11 mm); were all  present on 03/28/2021-s/p SBRT SEP 2023- [[place]]; SRS/ radiation treatment for brain metastasis He finished treatment on 11-26-22.   # SEP-OCT-worsening left-sided pleural effusion-status post paracentesis; exudative; Cytology negative- ?  Progressive disease.  # NOV 2023- JAN 2024 CARBO-TAXOL-BEV+ATEZO x4 cyles- BEV+ATEZO- until APRIL 2024  # STAGE IV- [JUNE 2022] Left lower lobe lung cancer-adenocarcinoma the lung;  EGFR- 19 del POSITIVE.  While on osimertinib- NOV 1st, 2023- PET scan- shows progressive disease in liver. Liquid Biopsy/Guardant testing-positive for EGFR mutation; otherwise Negative for any novel mutations.  Repeat liver Biopsy positive for adenocarcinoma. NEGATIVE for MET amplification.  S/p second opinion at [facility]- Dr.Stinchcomb. status post 4 cycles of carbo Taxol-Atezo+Bev- JAN 25th, 2024-PET- Interval response to therapy with resolution of previously demonstrated extensive hypermetabolic activity throughout the liver. PARIL 2024- ? Partial response, but clinically worse/rising LFTs  # MAY 6th, Brain MRI-progressive multiple brain metastases  # MAY 16th, 2024- Alimta+Avastin #1    Cancer of lower lobe of left lung (HCC)  03/23/2021 Initial Diagnosis   Cancer of lower lobe of left lung (HCC)   03/29/2021 Cancer Staging   Staging form: Lung, AJCC 8th Edition - Clinical: Stage IVB (cT3, cN3, pM1c) - Signed by [name], MD on 03/29/2021   03/30/2021 - 03/30/2021 Chemotherapy   Patient is on Treatment Plan : LUNG NSCLC Pemetrexed (Alimta) / Carboplatin q21d x 1 cycles     08/17/2022 - 01/15/2023 Chemotherapy   Patient is on Treatment Plan : LUNG Atezolizumab + Bevacizumab + Carboplatin + Paclitaxel q21d Induction x 4 cycles / Atezolizumab + Bevacizumab q21d Maintenance     02/28/2023 - 07/02/2023 Chemotherapy   Patient is on Treatment Plan : LUNG NON-SMALL CELL Bevacizumab + Pemetrexed q21d     08/07/2023 -  Chemotherapy   Patient is on Treatment Plan : LUNG NSCLC  Pemetrexed + Carboplatin + Amivantamab q21d x 4 Cycles / Pemetrexed + Amivantamab  q21d      HISTORY OF PRESENTING ILLNESS: Ambulating independently.  Accompanied by his mother.   [name] 42 y.o.  male patient with stage IV lung cancer adenocarcinoma-brain mets [synchronous] bone mets EGFR mutated -most recently noted to have progression on Alimta- Bev is here for follow-up/and proceed with chemotherapy with carboplatin-Alimta- Ami is here for a follow up.  Patient stated that has noticed a new rash to his face around his nose and mouth.  Recommended to use hydrocortisone cream patient moisturizing.  In the interim evaluated by symptom management clinic for worsening abdominal distention/abdominal pain CT scan showed ascites status post paracentesis.  Patient having nausea without any vomiting.  Also have abdominal distention.   No worsening headaches. Currently on  Eliquis 2.5mg  bid due to bleeding from his nose.  Intermittent nosebleeds.  Review of Systems  Constitutional:  Positive for malaise/fatigue. Negative for chills, diaphoresis and fever.  HENT:  Negative for nosebleeds and sore throat.   Eyes:  Negative for double vision.  Respiratory:  Positive for cough. Negative for wheezing.   Cardiovascular:  Negative for chest pain, palpitations, orthopnea and leg swelling.  Gastrointestinal:  Positive for heartburn and nausea. Negative for abdominal pain, blood in stool, diarrhea and melena.  Genitourinary:  Negative for dysuria, frequency and urgency.  Musculoskeletal:  Negative for back pain and joint pain.  Skin:  Negative for itching.  Neurological:  Negative for tingling and focal weakness.  Endo/Heme/Allergies:  Does not bruise/bleed easily.  Psychiatric/Behavioral:  Negative for depression. The patient is not nervous/anxious and does not have insomnia.      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    SURGICAL HISTORY: Past Surgical History:  Procedure Laterality Date   FRACTURE SURGERY     broke femur when he was 6 years   PORTA CATH INSERTION N/A 03/28/2021   Procedure: PORTA CATH INSERTION;  Surgeon: Renford Dills, MD;  Location: [facility] INVASIVE CV LAB;  Service: Cardiovascular;  Laterality: N/A;   VIDEO BRONCHOSCOPY WITH ENDOBRONCHIAL NAVIGATION N/A 03/15/2021   Procedure: VIDEO BRONCHOSCOPY WITH ENDOBRONCHIAL NAVIGATION;  Surgeon: Vida Rigger, MD;  Location: [facility] ORS;  Service: Thoracic;  Laterality: N/A;   VIDEO BRONCHOSCOPY WITH ENDOBRONCHIAL ULTRASOUND N/A 03/15/2021   Procedure: VIDEO BRONCHOSCOPY WITH ENDOBRONCHIAL ULTRASOUND;  Surgeon: Vida Rigger, MD;  Location: [facility] ORS;  Service: Thoracic;  Laterality: N/A;    SOCIAL HISTORY: Social History   Socioeconomic History   Marital status: Married    Spouse name: Darl Pikes   Number of children: 2   Years of education: Not on file   Highest education level: Not on file  Occupational History   Not on file  Tobacco Use   Smoking status: Never   Smokeless tobacco: Former    Types: Snuff, Chew  Vaping Use   Vaping status: Never Used  Substance and Sexual Activity   Alcohol use: Never   Drug use: Never   Sexual activity: Yes  Other Topics Concern   Not on file  Social History Narrative   [place]; with wife; 2 daughters[12 and 22]; never smoked; rare alcohol. 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 Dog and cat.   Social Determinants of Health   Financial Resource Strain: Low Risk  (05/10/2022)   Overall Financial Resource Strain (CARDIA)    Difficulty of Paying  Living Expenses: Not hard at all  Food Insecurity: No Food Insecurity (05/10/2022)   Hunger Vital Sign    Worried About Running Out of Food in the Last Year: Never true    Ran Out of Food in the Last Year: Never true  Transportation Needs: No Transportation Needs (03/16/2022)   PRAPARE - Therapist, art (Medical): No    Lack of Transportation (Non-Medical): No  Physical Activity: Not on file  Stress: Not on file  Social Connections: Socially Integrated (05/10/2022)   Social Connection and Isolation Panel [NHANES]    Frequency of Communication with Friends and Family: More than three times a week    Frequency of Social Gatherings with Friends and Family: More than three times a week    Attends Religious Services: More than 4 times per year    Active Member of [employer] or Organizations: Yes    Attends Engineer, structural: More than 4 times per year    Marital Status: Married  Catering manager Violence: Not on file    FAMILY HISTORY: Family History  Problem Relation Age of Onset   High Cholesterol Mother    Hypertension Father    Lung cancer Father    Skin cancer Father     ALLERGIES:  is allergic to codeine.  MEDICATIONS:  Current Outpatient Medications  Medication Sig Dispense Refill   albuterol (VENTOLIN HFA) 108 (90 Base) MCG/ACT inhaler Inhale 2 puffs into the lungs every 6 (six) hours as needed for wheezing or shortness of breath. 8 g 2   apixaban (ELIQUIS) 2.5 MG TABS tablet Take 1 tablet (2.5 mg total) by mouth 2 (two) times daily. 60 tablet 4   calcium carbonate (TUMS EX) 750 MG chewable tablet Chew 1 tablet by mouth daily.     folic acid (FOLVITE) 1 MG tablet TAKE 1 TABLET BY MOUTH EVERY DAY 90 tablet 1   lidocaine-prilocaine (EMLA) cream Apply 1 Application topically as needed. 30 g 0   metoprolol succinate (TOPROL-XL) 50 MG 24 hr tablet Take 50 mg by mouth daily. Take with or immediately following a meal.     montelukast (SINGULAIR) 10 MG tablet TAKE 1 TABLET BY MOUTH EVERYDAY AT BEDTIME 90 tablet 1   OLANZapine (ZYPREXA) 5 MG tablet Take 1 tablet (5 mg total) by mouth at bedtime as needed (nausea). 30 tablet 0   ondansetron (ZOFRAN-ODT) 4 MG disintegrating tablet Take 1 tablet (4 mg total) by mouth every 8 (eight) hours as needed for nausea  or vomiting. 20 tablet 1   sertraline (ZOLOFT) 50 MG tablet Take 1 tablet (50 mg total) by mouth daily. 30 tablet 1   traMADol (ULTRAM) 50 MG tablet Take 1 tablet (50 mg total) by mouth every 12 (twelve) hours as needed. 30 tablet 0   triamcinolone ointment (KENALOG) 0.5 % Apply 1 Application topically 2 (two) times daily. 30 g 1   Vitamin D, Ergocalciferol, (DRISDOL) 1.25 MG (50000 UNIT) CAPS capsule TAKE 1 CAPSULE BY MOUTH ONE TIME PER WEEK 12 capsule 1   No current facility-administered medications for this visit.   Facility-Administered Medications Ordered in Other Visits  Medication Dose Route Frequency Provider Last Rate Last Admin   filgrastim-sndz [facility]) injection 480 mcg  480 mcg Subcutaneous Q2000 Louretta Shorten R, MD   480 mcg at 08/22/23 1040   heparin lock flush 100 UNIT/ML injection            heparin lock flush 100 UNIT/ML injection  heparin lock flush 100 unit/mL  500 Units Intravenous Once Borders, Ivin Booty R, NP       sodium chloride flush (NS) 0.9 % injection 10 mL  10 mL Intravenous PRN Louretta Shorten R, MD   10 mL at 07/28/21 0852   sodium chloride flush (NS) 0.9 % injection 10 mL  10 mL Intravenous Once Borders, Daryl Eastern, NP          .  PHYSICAL EXAMINATION: ECOG PERFORMANCE STATUS: 1 - Symptomatic but completely ambulatory  Vitals:   08/22/23 0904  BP: 114/77  Pulse: 90  Temp: (!) 96.3 F (35.7 C)  SpO2: 98%     Filed Weights   08/22/23 0904  Weight: 210 lb 3.2 oz (95.3 kg)       Physical Exam HENT:     Head: Normocephalic and atraumatic.     Mouth/Throat:     Pharynx: No oropharyngeal exudate.  Eyes:     Pupils: Pupils are equal, round, and reactive to light.  Cardiovascular:     Rate and Rhythm: Normal rate and regular rhythm.  Pulmonary:     Effort: No respiratory distress.     Breath sounds: No wheezing.     Comments: Decreased breath sound on the left side compared to right. Abdominal:     General: Bowel sounds  are normal. There is no distension.     Palpations: Abdomen is soft. There is no mass.     Tenderness: There is no abdominal tenderness. There is no guarding or rebound.  Musculoskeletal:        General: No tenderness. Normal range of motion.     Cervical back: Normal range of motion and neck supple.  Skin:    General: Skin is warm.  Neurological:     Mental Status: He is alert and oriented to person, place, and time.  Psychiatric:        Mood and Affect: Affect normal.    Fungal dermatitis bilateral buttocks groin/underneath abdominal pannus  LABORATORY DATA:  I have reviewed the data as listed Lab Results  Component Value Date   WBC 1.5 (L) 08/22/2023   HGB 12.7 (L) 08/22/2023   HCT 36.0 (L) 08/22/2023   MCV 91.8 08/22/2023   PLT 33 (L) 08/22/2023   Recent Labs    08/13/23 1053 08/15/23 0821 08/22/23 0853  NA 135 134* 129*  K 3.9 3.5 3.2*  CL 101 101 94*  CO2 25 26 27   GLUCOSE 115* 107* 103*  BUN 17 11 10   CREATININE 0.75 0.55* 0.61  CALCIUM 8.8* 8.4* 7.8*  GFRNONAA >60 >60 >60  PROT 7.1 6.1* 5.8*  ALBUMIN 3.1* 2.8* 2.5*  AST 155* 140* 112*  ALT 118* 109* 119*  ALKPHOS 384* 333* 324*  BILITOT 2.5* 2.3*  2.3* 1.1  BILIDIR  --  0.9*  --   IBILI  --  1.4*  --     RADIOGRAPHIC STUDIES: I have personally reviewed the radiological images as listed and agreed with the findings in the report. US Paracentesis  Result Date: 08/14/2023 INDICATION: Patient with metastatic lung cancer presents today with ascites. Therapeutic paracentesis requested. EXAM: ULTRASOUND GUIDED PARACENTESIS MEDICATIONS: 1% lidocaine 10 mL COMPLICATIONS: None immediate. PROCEDURE: Informed written consent was obtained from the patient after a discussion of the risks, benefits and alternatives to treatment. A timeout was performed prior to the initiation of the procedure. Initial ultrasound scanning demonstrates a large amount of ascites within the right lower abdominal quadrant. The right lower  abdomen  was prepped and draped in the usual sterile fashion. 1% lidocaine was used for local anesthesia. Following this, a 19 gauge, 7-cm, Yueh catheter was introduced. An ultrasound image was saved for documentation purposes. The paracentesis was performed. The catheter was removed and a dressing was applied. The patient tolerated the procedure well without immediate post procedural complication. FINDINGS: A total of approximately 5.2 L of amber colored fluid was removed. IMPRESSION: Successful ultrasound-guided paracentesis yielding 5.2 liters of peritoneal fluid. Procedure performed by Alwyn Ren NP Electronically Signed   By: Olive Bass M.D.   On: 08/14/2023 15:26   CT ABDOMEN PELVIS W CONTRAST  Result Date: 08/13/2023 CLINICAL DATA:  Left-sided abdominal pain metastatic disease distension EXAM: CT ABDOMEN AND PELVIS WITH CONTRAST TECHNIQUE: Multidetector CT imaging of the abdomen and pelvis was performed using the standard protocol following bolus administration of intravenous contrast. RADIATION DOSE REDUCTION: This exam was performed according to the departmental dose-optimization program which includes automated exposure control, adjustment of the mA and/or kV according to patient size and/or use of iterative reconstruction technique. CONTRAST:  OMNIPAQUE IOHEXOL 300 MG/ML  SOLN COMPARISON:  PET CT 07/16/2023, CT abdomen pelvis 05/27/2023, CT 06/27/2021, MRI 09/04/2022 FINDINGS: Lower chest: Lung bases demonstrate incompletely visualized chronic large loculated left pleural effusion. Partial left lower lobe consolidation. Hepatobiliary: Atrophic hypodense appearing left hepatic lobe and irregular heterogenous hypodensity in the posterior right hepatic lobe, corresponding to the patient's history of metastatic disease. Liver appears grossly nodular consistent with cirrhotic morphology. No calcified gallstone or biliary dilatation. Findings appear slightly progressive compared with CT from  August. Pancreas: Unremarkable. No pancreatic ductal dilatation or surrounding inflammatory changes. Spleen: Upper normal in size measuring 13 cm oblique craniocaudad. Adrenals/Urinary Tract: Adrenal glands are normal. Kidneys show no hydronephrosis. The bladder is unremarkable Stomach/Bowel: The stomach is nonenlarged. No dilated small bowel. No acute bowel wall thickening. Vascular/Lymphatic: Nonaneurysmal aorta. No suspicious lymph nodes. 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 Main and right portal veins appear patent. Reproductive: Prostate is unremarkable. Other: No free air.  Interval large volume abdominopelvic ascites. Musculoskeletal: Scattered subtle foci of lytic and sclerotic change within the spine and pelvis, corresponding to history of treated skeletal metastatic disease. IMPRESSION: 1. Interval large volume abdominopelvic ascites. 2. Cirrhotic morphology of the liver with atrophic left hepatic lobe and irregular heterogenous hypodensity in the posterior right hepatic lobe, corresponding to the patient's known history of metastatic disease. Findings appear slightly progressive compared with CT from August. 3. 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 4. Incompletely visualized chronic large loculated left pleural effusion with partial left lower lobe consolidation. 5. No acute osseous abnormality. No new suspicious bone lesion by CT. Electronically Signed   By: Jasmine Pang M.D.   On: 08/13/2023 20:50   DG Chest 2 View  Result Date: 08/13/2023 CLINICAL DATA:  History of pleural effusion in lung cancer EXAM: CHEST - 2 VIEW COMPARISON:  05/28/2023 FINDINGS: Right chest port with the catheter tip at superior cavoatrial junction. Unchanged cardiac and mediastinal contours. Large left pleural effusion, overall unchanged from prior exam. No new focal pulmonary opacity in the right lung. No right pleural effusion. No pneumothorax bilaterally. No acute osseous abnormality. IMPRESSION: Large left  pleural effusion, overall unchanged from prior exam. Electronically Signed   By: Wiliam Ke M.D.   On: 08/13/2023 11:40   MR BRAIN W WO CONTRAST  Result Date: 07/30/2023 CLINICAL DATA:  42 year old male with metastatic lung cancer. Progression of numerous subcentimeter enhancing lesions in May,  subsequently regressed in July. Immunotherapy. Progressive liver metastases on PET-CT earlier this month. Restaging. EXAM: MRI HEAD WITHOUT AND WITH CONTRAST TECHNIQUE: Multiplanar, multiecho pulse sequences of the brain and surrounding structures were obtained without and with intravenous contrast. CONTRAST:  10 mL Vueway COMPARISON:  04/23/2023 and earlier. FINDINGS: Brain: Overall black blood postcontrast imaging reveals no disease progression compared to 02/18/2023. Overall few are subcentimeter enhancing lesions since that time, and those which are visible are stable since May. Multiple small lesions in the right parietal and occipital lobes are no longer visible. Most other lesions have not enlarged. A range from punctate (left temporal lobe series 13, image 46) up to approximately 12 mm (medial anterior right temporal lobe image 47). Subtle increased conspicuity of the cerebellar folia on postcontrast black blood (especially series 13, image 49), but this is not correlated with convincing leptomeningeal thickening on the other postcontrast sequences (series 15, image 14) and is felt to be artifact. Approximately a dozen lesions remain visible on series 13 (annotated), and not all of these are apparent on MPRAGE. Occasional associated T2 and FLAIR hyperintensity (left frontal operculum, middle frontal gyrus) is unchanged since May. Stable cerebral volume. No restricted diffusion to suggest acute infarction. No midline shift, mass effect, ventriculomegaly, extra-axial collection or acute intracranial hemorrhage. Cervicomedullary junction and pituitary are within normal limits. No new signal abnormality.  Occasional post treatment hemosiderin of the lesions is stable. Vascular: Major intracranial vascular flow voids are stable. Dominant distal right vertebral artery. Skull and upper cervical spine: Negative visible cervical spine and spinal cord. Visualized bone marrow signal is within normal limits. Sinuses/Orbits: Stable, negative. Other: Mastoids remain clear. Visible internal auditory structures appear normal. IMPRESSION: Stable post treatment appearance of the brain since 02/18/2023: - approximately a dozen small enhancing metastases remain visible (series 13) ranging from punctate to 12 mm. No significant edema or mass effect. - no new metastasis identified; subtle increased conspicuity of cerebellum leptomeninges on only series 13 is felt to be artifact. Attention on follow-up. Electronically Signed   By: Odessa Fleming M.D.   On: 07/30/2023 10:54    ASSESSMENT & PLAN:   Cancer of lower lobe of left lung (HCC) # STAGE IV- [JUNE 2022] Left lower lobe lung cancer-adenocarcinoma the lung;  EGFR- 19 del POSITIVE. s /p second opinion at [facility]- Dr.Stinchcomb.   OCT 1st, 2024- . Widespread hypermetabolic poorly marginated bilobar liver metastases, increased in size and metabolism since 04/22/2023 PET-CT; New hypermetabolic lytic posterior T3 vertebral metastasis;  New hypermetabolic subcentimeter bilateral level 2 neck lymph nodes, indeterminate for reactive versus metastatic nodes.Interlobular septal thickening throughout the right lung is mildly increased, cannot exclude lymphangitic carcinomatosis. Stable mild patchy hypermetabolism associated with bandlike chronic consolidation in the left lower lobe, favoring inflammatory uptake. Chronic loculated large left pleural effusion with smooth left pleural thickening. Currently on Carboplatin-Alimta-Amivatimab [since Oct 2024]  #on Carboplatin-Alimta-Amivatimab; however- HOLD cycle #1- day-15-Ami today sec to ANC 0.1; and platelets 33. [Hold eliquis]-   # Severe  neutropenia-secondary chemotherapy no signs of infection.  Proceed with zarxio for 3 injections.   # Abdominal discomfort/ascites [CT scan- NOv 2024]- s/p paracentesis; concern for progressive ascites.  Proceed with paracentesis-cytology added.   # new rash to his face around his nose and mouth-se cto chemo-. Per Dr. Alena Bills, pt may try OTC hydrocortisone cream; recommend kenalog if not improved; script sent.   #Asymptomatic multiple brain mets subcentimeter asymptomatic-  -s/p SBRT SEP 2023- [[place]].  S/p SBRT [[place]]- FEB 12th, 2024. July 9th,  2024-  Stable clinically. Decreased conspicuity of all enhancing lesions with some lesions described on the prior no longer visible and others less visible. No new lesions identified  S/p evaluation with neuro-oncology- MRI Brain- OCT, 2024-clinically stable. s/p evaluation with Dr.Vaslow.   # bone metastases-Hypocalcemia: mild- continue calcium 1200 plus vitamin D-3 1000-over-the-counter-1 pill a day.  APRIL 2024-vit D 25-OH-28- on Ergocalciferol progressive disease as above.  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 HOLD Eliquis 2.5 mg BID- given platlets 33.   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   # IV access/ port malfunction-s/p tPA.   [Tuesday/wed]- pref B12  on 8/27  # DISPOSITION: # Zarxio today; tomorrow; and Monday-   # HOLD chemo today; de-access # US paracentesis- ASAP-  # follow up on 11/15- MD; labs- cbc/cmp; chemo- Dr.B  # 40 minutes face-to-face with the patient discussing the above plan of care; more than 50% of time spent on prognosis/ natural history; counseling and coordination.      All questions were answered. The patient knows to call the clinic  with any problems, questions or concerns.    [name], MD 08/22/2023 11:47 AM

## 2023-08-22 NOTE — Assessment & Plan Note (Addendum)
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 s /p second opinion at [facility]- Dr.Stinchcomb.   OCT 1st, 2024- . Widespread hypermetabolic poorly marginated bilobar liver metastases, increased in size and metabolism since 04/22/2023 PET-CT; New hypermetabolic lytic posterior T3 vertebral metastasis;  New hypermetabolic subcentimeter bilateral level 2 neck lymph nodes, indeterminate for reactive versus metastatic nodes.Interlobular septal thickening throughout the right lung is mildly increased, cannot exclude lymphangitic carcinomatosis. Stable mild patchy hypermetabolism associated with bandlike chronic consolidation in the left lower lobe, favoring inflammatory uptake. Chronic loculated large left pleural effusion with smooth left pleural thickening. Currently on Carboplatin-Alimta-Amivatimab [since Oct 2024]  #on Carboplatin-Alimta-Amivatimab; however- HOLD cycle #1- day-15-Ami today sec to ANC 0.1; and platelets 33. [Hold eliquis]-   # Severe neutropenia-secondary chemotherapy no signs of infection.  Proceed with zarxio for 3 injections.   # Abdominal discomfort/ascites [CT scan- NOv 2024]- s/p paracentesis; concern for progressive ascites.  Proceed with paracentesis-cytology added.   # new rash to his face around his nose and mouth-se cto chemo-. Per Dr. Alena Bills, pt may try OTC hydrocortisone cream; recommend kenalog if not improved; script sent.   #Asymptomatic multiple brain mets subcentimeter asymptomatic-  -s/p SBRT SEP 2023- [[place]].  S/p SBRT [[place]]- FEB 12th, 2024. July 9th, 2024-  Stable clinically. Decreased conspicuity of all enhancing lesions with some lesions described on the prior no longer visible and others less visible. No new lesions identified  S/p evaluation with neuro-oncology- MRI Brain- OCT, 2024-clinically stable. s/p evaluation with Dr.Vaslow.   # bone metastases-Hypocalcemia: mild- continue calcium 1200 plus vitamin D-3  1000-over-the-counter-1 pill a day.  APRIL 2024-vit D 25-OH-28- on Ergocalciferol progressive disease as above.  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 HOLD Eliquis 2.5 mg BID- given platlets 33.   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   # IV access/ port malfunction-s/p tPA.   [Tuesday/wed]- pref B12  on 8/27  # DISPOSITION: # Zarxio today; tomorrow; and Monday-   # HOLD chemo today; de-access # US paracentesis- ASAP-  # follow up on 11/15- MD; labs- cbc/cmp; chemo- Dr.B  # 40 minutes face-to-face with the patient discussing the above plan of care; more than 50% of time spent on prognosis/ natural history; counseling and coordination.

## 2023-08-22 NOTE — Telephone Encounter (Signed)
Called to patient regarding Paracentesis; Agreed to come tomm at 2 pm. He will report to medical mall registration desk at 2 pm.

## 2023-08-22 NOTE — Telephone Encounter (Signed)
Paracentesis request faxed to IR.

## 2023-08-22 NOTE — Progress Notes (Signed)
He feels like he his gaining fluid in the abdomen area again.  C/o rash on nose. Bleeds at times.

## 2023-08-23 ENCOUNTER — Ambulatory Visit
Admission: RE | Admit: 2023-08-23 | Discharge: 2023-08-23 | Disposition: A | Discharge status: Home / Self Care | Payer: Managed Care, Other (non HMO) | Source: Ambulatory Visit | Attending: Internal Medicine | Admitting: Internal Medicine

## 2023-08-23 ENCOUNTER — Inpatient Hospital Stay: Payer: Managed Care, Other (non HMO)

## 2023-08-23 DIAGNOSIS — C3432 Malignant neoplasm of lower lobe, left bronchus or lung: Secondary | ICD-10-CM | POA: Insufficient documentation

## 2023-08-23 DIAGNOSIS — E538 Deficiency of other specified B group vitamins: Secondary | ICD-10-CM

## 2023-08-23 DIAGNOSIS — R180 Malignant ascites: Secondary | ICD-10-CM | POA: Insufficient documentation

## 2023-08-23 DIAGNOSIS — Z5112 Encounter for antineoplastic immunotherapy: Secondary | ICD-10-CM | POA: Diagnosis not present

## 2023-08-23 LAB — PROTEIN, PLEURAL OR PERITONEAL FLUID: Total protein, fluid: <3 g/dL

## 2023-08-23 LAB — ALBUMIN, PLEURAL OR PERITONEAL FLUID: Albumin, Fluid: <1.5 g/dL

## 2023-08-23 LAB — BODY FLUID CELL COUNT WITH DIFFERENTIAL
Eos, Fluid: 1 %
Lymphs, Fluid: 32 %
Monocyte-Macrophage-Serous Fluid: 65 %
Neutrophil Count, Fluid: 2 %
Total Nucleated Cell Count, Fluid: 180 uL

## 2023-08-23 LAB — LACTATE DEHYDROGENASE, PLEURAL OR PERITONEAL FLUID: LD, Fluid: 188 U/L — ABNORMAL HIGH (ref 3–23)

## 2023-08-23 MED ORDER — FILGRASTIM-SNDZ 480 MCG/0.8ML IJ SOSY
480.0000 ug | PREFILLED_SYRINGE | Freq: Every day | INTRAMUSCULAR | Status: DC
  Administered 2023-08-23: 480 ug via SUBCUTANEOUS

## 2023-08-23 MED ORDER — LIDOCAINE HCL (PF) 1 % IJ SOLN
10.0000 mL | Freq: Once | INTRAMUSCULAR | Status: AC
  Administered 2023-08-23: 10 mL via INTRADERMAL
  Filled 2023-08-23: qty 10

## 2023-08-23 NOTE — Procedures (Signed)
PROCEDURE SUMMARY:  Successful image-guided paracentesis from the right lower abdomen.  Yielded 4.1 liters of amber fluid.  No immediate complications.  EBL = trace. Patient tolerated well.   Specimen was  sent for labs.  Please see imaging section of Epic for full dictation.   Kennieth Francois PA-C 08/23/2023 4:27 PM

## 2023-08-26 ENCOUNTER — Inpatient Hospital Stay: Payer: Managed Care, Other (non HMO)

## 2023-08-26 ENCOUNTER — Encounter: Payer: Self-pay | Admitting: Internal Medicine

## 2023-08-26 DIAGNOSIS — E538 Deficiency of other specified B group vitamins: Secondary | ICD-10-CM

## 2023-08-26 DIAGNOSIS — Z5112 Encounter for antineoplastic immunotherapy: Secondary | ICD-10-CM | POA: Diagnosis not present

## 2023-08-26 LAB — AEROBIC CULTURE W GRAM STAIN (SUPERFICIAL SPECIMEN): Culture: NO GROWTH

## 2023-08-26 MED ORDER — FILGRASTIM-SNDZ 480 MCG/0.8ML IJ SOSY
480.0000 ug | PREFILLED_SYRINGE | Freq: Every day | INTRAMUSCULAR | Status: DC
  Administered 2023-08-26: 480 ug via SUBCUTANEOUS
  Filled 2023-08-26: qty 0.8

## 2023-08-28 ENCOUNTER — Other Ambulatory Visit: Payer: Self-pay | Admitting: Hospice and Palliative Medicine

## 2023-08-28 LAB — CYTOLOGY - NON PAP

## 2023-08-28 MED ORDER — METHYLPHENIDATE HCL 5 MG PO TABS: 5.0000 mg | ORAL_TABLET | Freq: Two times a day (BID) | ORAL | 0 refills | Status: DC | PRN

## 2023-08-28 NOTE — Progress Notes (Signed)
Discussed with psychiatry [facility]) who recommend methylphenidate for fatigue/quality of life.  Rx sent to pharmacy.

## 2023-08-28 NOTE — Progress Notes (Signed)
Dear Referring Specialist:  Thank you for referring your patient to [facility]. Below you will find my new psychiatric medication recommendations.  Once reviewed, please confirm prescription(s) within 2 business days. If you modify or decline the recommendations, please document these modifications and notify us. If you have any questions or concerns, please call our [facility] provider line at [phone].  Best,   Demetria Pore, MD  ---  Name: Nathan Bad "Ryan" Date of Birth: Sep 07, 1981 Referring Provider: Laurette Schimke, NP Patient MRN: [id number]  Psychiatric Provider recommends that the Referring Specialist prescribe the below medications....  Start methylphenidate IR 5mg  in the morning and 5mg  at 1PM if needed for fatigue Reason for Recommendation: patient reports fatigue as the most impactful symptom at this time.  ---  Thank you, Marissa Nestle [facility] Care Manager

## 2023-08-30 ENCOUNTER — Inpatient Hospital Stay: Payer: Managed Care, Other (non HMO)

## 2023-08-30 ENCOUNTER — Inpatient Hospital Stay (HOSPITAL_BASED_OUTPATIENT_CLINIC_OR_DEPARTMENT_OTHER): Payer: Managed Care, Other (non HMO) | Admitting: Internal Medicine

## 2023-08-30 ENCOUNTER — Encounter: Payer: Self-pay | Admitting: Internal Medicine

## 2023-08-30 VITALS — BP 108/90 | HR 80 | Temp 95.7°F | Ht 71.0 in | Wt 209.8 lb

## 2023-08-30 VITALS — BP 101/72 | HR 86 | Temp 96.0°F | Resp 18

## 2023-08-30 DIAGNOSIS — C3432 Malignant neoplasm of lower lobe, left bronchus or lung: Secondary | ICD-10-CM

## 2023-08-30 DIAGNOSIS — Z5112 Encounter for antineoplastic immunotherapy: Secondary | ICD-10-CM | POA: Diagnosis not present

## 2023-08-30 LAB — CMP (CANCER CENTER ONLY)
ALT: 87 U/L — ABNORMAL HIGH (ref 0–44)
AST: 103 U/L — ABNORMAL HIGH (ref 15–41)
Albumin: 2.3 g/dL — ABNORMAL LOW (ref 3.5–5.0)
Alkaline Phosphatase: 350 U/L — ABNORMAL HIGH (ref 38–126)
Anion gap: 7 (ref 5–15)
BUN: 8 mg/dL (ref 6–20)
CO2: 29 mmol/L (ref 22–32)
Calcium: 7.7 mg/dL — ABNORMAL LOW (ref 8.9–10.3)
Chloride: 89 mmol/L — ABNORMAL LOW (ref 98–111)
Creatinine: 0.64 mg/dL (ref 0.61–1.24)
GFR, Estimated: >60 mL/min (ref >60)
Glucose, Bld: 104 mg/dL — ABNORMAL HIGH (ref 70–99)
Potassium: 3.4 mmol/L — ABNORMAL LOW (ref 3.5–5.1)
Sodium: 125 mmol/L — ABNORMAL LOW (ref 135–145)
Total Bilirubin: 1.1 mg/dL (ref <1.2)
Total Protein: 5.4 g/dL — ABNORMAL LOW (ref 6.5–8.1)

## 2023-08-30 LAB — CBC WITH DIFFERENTIAL (CANCER CENTER ONLY)
Abs Immature Granulocytes: 7.29 10*3/uL — ABNORMAL HIGH (ref 0.00–0.07)
Basophils Absolute: 0 10*3/uL (ref 0.0–0.1)
Basophils Relative: 0 %
Eosinophils Absolute: 0 10*3/uL (ref 0.0–0.5)
Eosinophils Relative: 0 %
HCT: 38.7 % — ABNORMAL LOW (ref 39.0–52.0)
Hemoglobin: 13.5 g/dL (ref 13.0–17.0)
Immature Granulocytes: 27 %
Lymphocytes Relative: 10 %
Lymphs Abs: 2.8 10*3/uL (ref 0.7–4.0)
MCH: 32.1 pg (ref 26.0–34.0)
MCHC: 34.9 g/dL (ref 30.0–36.0)
MCV: 91.9 fL (ref 80.0–100.0)
Monocytes Absolute: 3.6 10*3/uL — ABNORMAL HIGH (ref 0.1–1.0)
Monocytes Relative: 13 %
Neutro Abs: 13.2 10*3/uL — ABNORMAL HIGH (ref 1.7–7.7)
Neutrophils Relative %: 50 %
Platelet Count: 189 10*3/uL (ref 150–400)
RBC: 4.21 MIL/uL — ABNORMAL LOW (ref 4.22–5.81)
RDW: 14.6 % (ref 11.5–15.5)
Smear Review: NORMAL
WBC Count: 26.9 10*3/uL — ABNORMAL HIGH (ref 4.0–10.5)
nRBC: 0 % (ref 0.0–0.2)

## 2023-08-30 MED ORDER — DEXAMETHASONE SODIUM PHOSPHATE 10 MG/ML IJ SOLN
10.0000 mg | Freq: Once | INTRAMUSCULAR | Status: AC
  Administered 2023-08-30: 10 mg via INTRAVENOUS
  Filled 2023-08-30: qty 1

## 2023-08-30 MED ORDER — DIPHENHYDRAMINE HCL 25 MG PO CAPS
50.0000 mg | ORAL_CAPSULE | Freq: Once | ORAL | Status: AC
  Administered 2023-08-30: 50 mg via ORAL
  Filled 2023-08-30: qty 2

## 2023-08-30 MED ORDER — MONTELUKAST SODIUM 10 MG PO TABS
10.0000 mg | ORAL_TABLET | Freq: Once | ORAL | Status: AC
Start: 2023-08-30 — End: 2023-08-30
  Administered 2023-08-30: 10 mg via ORAL
  Filled 2023-08-30: qty 1

## 2023-08-30 MED ORDER — ONDANSETRON HCL 4 MG/2ML IJ SOLN
8.0000 mg | Freq: Once | INTRAMUSCULAR | Status: AC
  Administered 2023-08-30: 8 mg via INTRAVENOUS
  Filled 2023-08-30: qty 4

## 2023-08-30 MED ORDER — FAMOTIDINE IN NACL 20-0.9 MG/50ML-% IV SOLN
20.0000 mg | Freq: Once | INTRAVENOUS | Status: AC
  Administered 2023-08-30: 20 mg via INTRAVENOUS
  Filled 2023-08-30: qty 50

## 2023-08-30 MED ORDER — ACETAMINOPHEN 325 MG PO TABS
650.0000 mg | ORAL_TABLET | Freq: Once | ORAL | Status: AC
Start: 2023-08-30 — End: 2023-08-30
  Administered 2023-08-30: 650 mg via ORAL
  Filled 2023-08-30: qty 2

## 2023-08-30 MED ORDER — SODIUM CHLORIDE 0.9 % IV SOLN
1750.0000 mg | Freq: Once | INTRAVENOUS | Status: AC
  Administered 2023-08-30: 1750 mg via INTRAVENOUS
  Filled 2023-08-30: qty 35

## 2023-08-30 MED ORDER — HEPARIN SOD (PORK) LOCK FLUSH 100 UNIT/ML IV SOLN
500.0000 [IU] | Freq: Once | INTRAVENOUS | Status: AC
  Administered 2023-08-30: 500 [IU] via INTRAVENOUS
  Filled 2023-08-30: qty 5

## 2023-08-30 MED ORDER — SODIUM CHLORIDE 0.9 % IV SOLN
Freq: Once | INTRAVENOUS | Status: AC
  Filled 2023-08-30: qty 250

## 2023-08-30 NOTE — Progress Notes (Signed)
Nutrition Follow-up:  42 year old male with stage IV lung adenocarcinoma, metastatic to brain, bone.  Patient receiving pemetrexed, carboplatin, amivantamab.  Met with patient and mother during infusion.  Reports appetite is decreased.  Having issues with certain foods (ie french fries, fried chicken) not sitting well on stomach, causing vomiting after eating.  Yesterday ate 2 pieces of toast with jelly and chicken noodle soup.  Has not been taking nausea medication on regular basis.    Noted thoracentesis on 11/8 with 4.1 liters removed.     Medications: zyprexa  Labs: Na 125, K 3.4, glucose 104  Anthropometrics:   Weight 209 lb 12.8 oz   226 lb 8/27  203 lb 9.6 oz on 05/05/21 (seen by RD)   NUTRITION DIAGNOSIS: Inadequate oral intake ongoing   INTERVENTION:  MD and Nurse Navigator discussed taking nausea medication on more regular basis.  Discussed foods to choose when nauseated.  Handout provided     MONITORING, EVALUATION, GOAL: weight trends, intake   NEXT VISIT: to be determined with treatments  Leonarda Leis B. Freida Busman, RD, LDN Registered Dietitian [phone]

## 2023-08-30 NOTE — Progress Notes (Signed)
[name] CONSULT NOTE  Patient Care Team: Pcp, No as PCP - General [name], RN as Oncology Nurse Navigator [name], MD as Consulting Physician (Internal Medicine)  CHIEF COMPLAINTS/PURPOSE OF CONSULTATION: Lung cancer  #  Oncology History Overview Note  IMPRESSION: 1. Patchy nodular fat stranding throughout the anterior left upper quadrant peritoneal fat, nonspecific, cannot exclude peritoneal carcinomatosis. Dedicated CT abdomen/pelvis with oral and IV contrast recommended for further evaluation. 2. Dense patchy consolidation replacing much of the left lower lung lobe, appearing masslike in the superior segment left lower lobe, with associated bulging of the left major fissure and associated left lower lobe volume loss. Fine nodularity throughout both lungs with an upper lobe predominance. Asymmetric left upper lobe interlobular septal thickening. These findings are indeterminate, with differential including multilobar pneumonia, sarcoidosis or a neoplastic process. The persistence on radiographs back to 01/20/2021 despite antibiotic therapy make sarcoidosis or a neoplastic process more likely. Pulmonology consultation suggested for consideration of bronchoscopic evaluation. 3. Small dependent left pleural effusion. 4. Mild mediastinal lymphadenopathy, nonspecific. 5. Subacute healing lateral right sixth rib fracture.   DIAGNOSIS:  A. LUNG, LEFT LOWER LOBE; ENB-ASSISTED BIOPSY:  - NON-SMALL CELL CARCINOMA, FAVOR ADENOCARCINOMA.  - FOREIGN MATERIAL SUGGESTIVE OF ASPIRATION.   # EGFR MUTATED: 54 del- June 28th, 2022- [name]. [limited]  # Asymptomatic multiple brain mets subcentimeter asymptomatic -JUNE 29th, 2023- Numerous metastatic lesions in the supratentorial brain-increase in number also conspicuity compared to January 2023.  Pre-SBRT MRI brain AUG 18th, 2023-  Eighteen small enhancing brain metastases (punctate to 11 mm); were all  present on 03/28/2021-s/p SBRT SEP 2023- [[place]]; SRS/ radiation treatment for brain metastasis He finished treatment on 11-26-22.   # SEP-OCT-worsening left-sided pleural effusion-status post paracentesis; exudative; Cytology negative- ?  Progressive disease.  # NOV 2023- JAN 2024 CARBO-TAXOL-BEV+ATEZO x4 cyles- BEV+ATEZO- until APRIL 2024  # STAGE IV- [JUNE 2022] Left lower lobe lung cancer-adenocarcinoma the lung;  EGFR- 19 del POSITIVE.  While on osimertinib- NOV 1st, 2023- PET scan- shows progressive disease in liver. Liquid Biopsy/Guardant testing-positive for EGFR mutation; otherwise Negative for any novel mutations.  Repeat liver Biopsy positive for adenocarcinoma. NEGATIVE for MET amplification.  S/p second opinion at [facility]- Dr.Stinchcomb. status post 4 cycles of carbo Taxol-Atezo+Bev- JAN 25th, 2024-PET- Interval response to therapy with resolution of previously demonstrated extensive hypermetabolic activity throughout the liver. PARIL 2024- ? Partial response, but clinically worse/rising LFTs  # MAY 6th, Brain MRI-progressive multiple brain metastases  # MAY 16th, 2024- Alimta+Avastin #1    Cancer of lower lobe of left lung (HCC)  03/23/2021 Initial Diagnosis   Cancer of lower lobe of left lung (HCC)   03/29/2021 Cancer Staging   Staging form: Lung, AJCC 8th Edition - Clinical: Stage IVB (cT3, cN3, pM1c) - Signed by [name], MD on 03/29/2021   03/30/2021 - 03/30/2021 Chemotherapy   Patient is on Treatment Plan : LUNG NSCLC Pemetrexed (Alimta) / Carboplatin q21d x 1 cycles     08/17/2022 - 01/15/2023 Chemotherapy   Patient is on Treatment Plan : LUNG Atezolizumab + Bevacizumab + Carboplatin + Paclitaxel q21d Induction x 4 cycles / Atezolizumab + Bevacizumab q21d Maintenance     02/28/2023 - 07/02/2023 Chemotherapy   Patient is on Treatment Plan : LUNG NON-SMALL CELL Bevacizumab + Pemetrexed q21d     08/07/2023 -  Chemotherapy   Patient is on Treatment Plan : LUNG NSCLC  Pemetrexed + Carboplatin + Amivantamab q21d x 4 Cycles / Pemetrexed + Amivantamab  q21d      HISTORY OF PRESENTING ILLNESS: Ambulating independently.  Accompanied by his mother.   [name] 42 y.o.  male patient with stage IV lung cancer adenocarcinoma-brain mets [synchronous] bone mets EGFR mutated -most recently noted to have progression on Alimta- Bev is here for follow-up/and proceed with chemotherapy with carboplatin-Alimta- Ami is here for a follow up.  Cycle number 1 day 15 was held because of-neutropenia/thrombocytopenia.  Patient also status post growth factor support.  In the interim muscle status post paracentesis for ascites.  Patient having increasing nausea with vomiting.   S/p evaluation with psychiatry-recommended methylphenidate for ongoing fatigue.  Patient stated that has noticed a new rash to his face around his nose and mouth.- on kenalog.    No worsening headaches. Currently ON HOLD Eliquis 2.5mg  bid due to bleeding from his nose.    Review of Systems  Constitutional:  Positive for malaise/fatigue. Negative for chills, diaphoresis and fever.  HENT:  Negative for nosebleeds and sore throat.   Eyes:  Negative for double vision.  Respiratory:  Positive for cough. Negative for wheezing.   Cardiovascular:  Negative for chest pain, palpitations, orthopnea and leg swelling.  Gastrointestinal:  Positive for heartburn and nausea. Negative for abdominal pain, blood in stool, diarrhea and melena.  Genitourinary:  Negative for dysuria, frequency and urgency.  Musculoskeletal:  Negative for back pain and joint pain.  Neurological:  Negative for tingling and focal weakness.  Endo/Heme/Allergies:  Does not bruise/bleed easily.  Psychiatric/Behavioral:  Negative for depression. The patient is not nervous/anxious and does not have insomnia.      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    SURGICAL HISTORY: Past Surgical History:  Procedure Laterality Date   FRACTURE SURGERY     broke femur when he was 6 years   PORTA CATH INSERTION N/A 03/28/2021   Procedure: PORTA CATH INSERTION;  Surgeon: Renford Dills, MD;  Location: [facility] INVASIVE CV LAB;  Service: Cardiovascular;  Laterality: N/A;   VIDEO BRONCHOSCOPY WITH ENDOBRONCHIAL NAVIGATION N/A 03/15/2021   Procedure: VIDEO BRONCHOSCOPY WITH ENDOBRONCHIAL NAVIGATION;  Surgeon: Vida Rigger, MD;  Location: [facility] ORS;  Service: Thoracic;  Laterality: N/A;   VIDEO BRONCHOSCOPY WITH ENDOBRONCHIAL ULTRASOUND N/A 03/15/2021   Procedure: VIDEO BRONCHOSCOPY WITH ENDOBRONCHIAL ULTRASOUND;  Surgeon: Vida Rigger, MD;  Location: [facility] ORS;  Service: Thoracic;  Laterality: N/A;    SOCIAL HISTORY: Social History   Socioeconomic History   Marital status: Married    Spouse name: Darl Pikes   Number of children: 2   Years of education: Not on file   Highest education level: Not on file  Occupational History   Not on file  Tobacco Use   Smoking status: Never   Smokeless tobacco: Former    Types: Snuff, Chew  Vaping Use   Vaping status: Never Used  Substance and Sexual Activity   Alcohol use: Never   Drug use: Never   Sexual activity: Yes  Other Topics Concern   Not on file  Social History Narrative   [place]; with wife; 2 daughters[12 and 22]; never smoked; rare alcohol. 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 Dog and cat.   Social Determinants of Health   Financial Resource Strain: Low Risk  (05/10/2022)   Overall Financial Resource Strain (CARDIA)    Difficulty of Paying  Living Expenses: Not hard at all  Food Insecurity: No Food Insecurity (05/10/2022)   Hunger Vital Sign    Worried About Running Out of Food in the Last Year: Never true    Ran Out of Food in the Last Year: Never true  Transportation Needs: No Transportation Needs (03/16/2022)   PRAPARE - Doctor, general practice (Medical): No    Lack of Transportation (Non-Medical): No  Physical Activity: Not on file  Stress: Not on file  Social Connections: Socially Integrated (05/10/2022)   Social Connection and Isolation Panel [NHANES]    Frequency of Communication with Friends and Family: More than three times a week    Frequency of Social Gatherings with Friends and Family: More than three times a week    Attends Religious Services: More than 4 times per year    Active Member of [employer] or Organizations: Yes    Attends Engineer, structural: More than 4 times per year    Marital Status: Married  Catering manager Violence: Not on file    FAMILY HISTORY: Family History  Problem Relation Age of Onset   High Cholesterol Mother    Hypertension Father    Lung cancer Father    Skin cancer Father     ALLERGIES:  is allergic to codeine.  MEDICATIONS:  Current Outpatient Medications  Medication Sig Dispense Refill   albuterol (VENTOLIN HFA) 108 (90 Base) MCG/ACT inhaler Inhale 2 puffs into the lungs every 6 (six) hours as needed for wheezing or shortness of breath. 8 g 2   apixaban (ELIQUIS) 2.5 MG TABS tablet Take 1 tablet (2.5 mg total) by mouth 2 (two) times daily. 60 tablet 4   calcium carbonate (TUMS EX) 750 MG chewable tablet Chew 1 tablet by mouth daily.     folic acid (FOLVITE) 1 MG tablet TAKE 1 TABLET BY MOUTH EVERY DAY 90 tablet 1   lidocaine-prilocaine (EMLA) cream Apply 1 Application topically as needed. 30 g 0   methylphenidate (RITALIN) 5 MG tablet Take 1 tablet (5 mg total) by mouth 2 (two) times daily as needed (fatigue). 60 tablet 0   metoprolol succinate (TOPROL-XL) 50 MG 24 hr tablet Take 50 mg by mouth daily. Take with or immediately following a meal.     montelukast (SINGULAIR) 10 MG tablet TAKE 1 TABLET BY MOUTH EVERYDAY AT BEDTIME 90 tablet 1   OLANZapine (ZYPREXA) 5 MG tablet Take 1 tablet (5 mg total) by mouth at bedtime as needed (nausea). 30 tablet 0    ondansetron (ZOFRAN-ODT) 4 MG disintegrating tablet Take 1 tablet (4 mg total) by mouth every 8 (eight) hours as needed for nausea or vomiting. 20 tablet 1   sertraline (ZOLOFT) 50 MG tablet Take 1 tablet (50 mg total) by mouth daily. 30 tablet 1   traMADol (ULTRAM) 50 MG tablet Take 1 tablet (50 mg total) by mouth every 12 (twelve) hours as needed. 30 tablet 0   triamcinolone ointment (KENALOG) 0.5 % Apply 1 Application topically 2 (two) times daily. 30 g 1   Vitamin D, Ergocalciferol, (DRISDOL) 1.25 MG (50000 UNIT) CAPS capsule TAKE 1 CAPSULE BY MOUTH ONE TIME PER WEEK 12 capsule 1   No current facility-administered medications for this visit.   Facility-Administered Medications Ordered in Other Visits  Medication Dose Route Frequency Provider Last Rate Last Admin   amivantamab-vmjw (RYBREVANT) 1,750 mg in sodium chloride 0.9 % 215 mL chemo infusion  1,750 mg Intravenous Once [name], MD  dexamethasone (DECADRON) injection 10 mg  10 mg Intravenous Once [name], MD       famotidine (PEPCID) IVPB 20 mg premix  20 mg Intravenous Once Louretta Shorten R, MD       heparin lock flush 100 UNIT/ML injection            heparin lock flush 100 UNIT/ML injection            heparin lock flush 100 unit/mL  500 Units Intravenous Once Borders, Daryl Eastern, NP       ondansetron (ZOFRAN) injection 8 mg  8 mg Intravenous Once Louretta Shorten R, MD       sodium chloride flush (NS) 0.9 % injection 10 mL  10 mL Intravenous PRN Louretta Shorten R, MD   10 mL at 07/28/21 0852   sodium chloride flush (NS) 0.9 % injection 10 mL  10 mL Intravenous Once Borders, Daryl Eastern, NP          .  PHYSICAL EXAMINATION: ECOG PERFORMANCE STATUS: 1 - Symptomatic but completely ambulatory  Vitals:   08/30/23 0819  BP: (!) 108/90  Pulse: 80  Temp: (!) 95.7 F (35.4 C)  SpO2: 95%     Filed Weights   08/30/23 0819  Weight: 209 lb 12.8 oz (95.2 kg)       Physical Exam HENT:      Head: Normocephalic and atraumatic.     Mouth/Throat:     Pharynx: No oropharyngeal exudate.  Eyes:     Pupils: Pupils are equal, round, and reactive to light.  Cardiovascular:     Rate and Rhythm: Normal rate and regular rhythm.  Pulmonary:     Effort: No respiratory distress.     Breath sounds: No wheezing.     Comments: Decreased breath sound on the left side compared to right. Abdominal:     General: Bowel sounds are normal. There is no distension.     Palpations: Abdomen is soft. There is no mass.     Tenderness: There is no abdominal tenderness. There is no guarding or rebound.  Musculoskeletal:        General: No tenderness. Normal range of motion.     Cervical back: Normal range of motion and neck supple.  Skin:    General: Skin is warm.  Neurological:     Mental Status: He is alert and oriented to person, place, and time.  Psychiatric:        Mood and Affect: Affect normal.    Fungal dermatitis bilateral buttocks groin/underneath abdominal pannus  LABORATORY DATA:  I have reviewed the data as listed Lab Results  Component Value Date   WBC 26.9 (H) 08/30/2023   HGB 13.5 08/30/2023   HCT 38.7 (L) 08/30/2023   MCV 91.9 08/30/2023   PLT 189 08/30/2023   Recent Labs    08/15/23 0821 08/22/23 0853 08/30/23 0801  NA 134* 129* 125*  K 3.5 3.2* 3.4*  CL 101 94* 89*  CO2 26 27 29   GLUCOSE 107* 103* 104*  BUN 11 10 8   CREATININE 0.55* 0.61 0.64  CALCIUM 8.4* 7.8* 7.7*  GFRNONAA >60 >60 >60  PROT 6.1* 5.8* 5.4*  ALBUMIN 2.8* 2.5* 2.3*  AST 140* 112* 103*  ALT 109* 119* 87*  ALKPHOS 333* 324* 350*  BILITOT 2.3*  2.3* 1.1 1.1  BILIDIR 0.9*  --   --   IBILI 1.4*  --   --     RADIOGRAPHIC STUDIES: I have personally reviewed the  radiological images as listed and agreed with the findings in the report. US Paracentesis  Result Date: 08/23/2023 INDICATION: Malignant ascites. Request received for diagnostic and therapeutic paracentesis EXAM: ULTRASOUND  GUIDED  PARACENTESIS MEDICATIONS: 10 cc 1% lidocaine COMPLICATIONS: None immediate. PROCEDURE: Informed written consent was obtained from the patient after a discussion of the risks, benefits and alternatives to treatment. A timeout was performed prior to the initiation of the procedure. Initial ultrasound scanning demonstrates a large amount of ascites within the right lower abdominal quadrant. The right lower abdomen was prepped and draped in the usual sterile fashion. 1% lidocaine was used for local anesthesia. Following this, a 19 gauge, 10-cm, Yueh catheter was introduced. An ultrasound image was saved for documentation purposes. The paracentesis was performed. The catheter was removed and a dressing was applied. The patient tolerated the procedure well without immediate post procedural complication. FINDINGS: A total of approximately 4.1 L of amber fluid was removed. Samples were sent to the laboratory as requested by the clinical team. IMPRESSION: Successful ultrasound-guided paracentesis yielding 4.1 liters of peritoneal fluid. Procedure performed by Mina Marble, PA-C Electronically Signed   By: Olive Bass M.D.   On: 08/23/2023 16:43   US Paracentesis  Result Date: 08/14/2023 INDICATION: Patient with metastatic lung cancer presents today with ascites. Therapeutic paracentesis requested. EXAM: ULTRASOUND GUIDED PARACENTESIS MEDICATIONS: 1% lidocaine 10 mL COMPLICATIONS: None immediate. PROCEDURE: Informed written consent was obtained from the patient after a discussion of the risks, benefits and alternatives to treatment. A timeout was performed prior to the initiation of the procedure. Initial ultrasound scanning demonstrates a large amount of ascites within the right lower abdominal quadrant. The right lower abdomen was prepped and draped in the usual sterile fashion. 1% lidocaine was used for local anesthesia. Following this, a 19 gauge, 7-cm, Yueh catheter was introduced. An ultrasound image  was saved for documentation purposes. The paracentesis was performed. The catheter was removed and a dressing was applied. The patient tolerated the procedure well without immediate post procedural complication. FINDINGS: A total of approximately 5.2 L of amber colored fluid was removed. IMPRESSION: Successful ultrasound-guided paracentesis yielding 5.2 liters of peritoneal fluid. Procedure performed by Alwyn Ren NP Electronically Signed   By: Olive Bass M.D.   On: 08/14/2023 15:26   CT ABDOMEN PELVIS W CONTRAST  Result Date: 08/13/2023 CLINICAL DATA:  Left-sided abdominal pain metastatic disease distension EXAM: CT ABDOMEN AND PELVIS WITH CONTRAST TECHNIQUE: Multidetector CT imaging of the abdomen and pelvis was performed using the standard protocol following bolus administration of intravenous contrast. RADIATION DOSE REDUCTION: This exam was performed according to the departmental dose-optimization program which includes automated exposure control, adjustment of the mA and/or kV according to patient size and/or use of iterative reconstruction technique. CONTRAST:  OMNIPAQUE IOHEXOL 300 MG/ML  SOLN COMPARISON:  PET CT 07/16/2023, CT abdomen pelvis 05/27/2023, CT 06/27/2021, MRI 09/04/2022 FINDINGS: Lower chest: Lung bases demonstrate incompletely visualized chronic large loculated left pleural effusion. Partial left lower lobe consolidation. Hepatobiliary: Atrophic hypodense appearing left hepatic lobe and irregular heterogenous hypodensity in the posterior right hepatic lobe, corresponding to the patient's history of metastatic disease. Liver appears grossly nodular consistent with cirrhotic morphology. No calcified gallstone or biliary dilatation. Findings appear slightly progressive compared with CT from August. Pancreas: Unremarkable. No pancreatic ductal dilatation or surrounding inflammatory changes. Spleen: Upper normal in size measuring 13 cm oblique craniocaudad. Adrenals/Urinary  Tract: Adrenal glands are normal. Kidneys show no hydronephrosis. The bladder is unremarkable Stomach/Bowel: The stomach is nonenlarged.  No dilated small bowel. No acute bowel wall thickening. Vascular/Lymphatic: Nonaneurysmal aorta. No suspicious lymph nodes. 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 Main and right portal veins appear patent. Reproductive: Prostate is unremarkable. Other: No free air.  Interval large volume abdominopelvic ascites. Musculoskeletal: Scattered subtle foci of lytic and sclerotic change within the spine and pelvis, corresponding to history of treated skeletal metastatic disease. IMPRESSION: 1. Interval large volume abdominopelvic ascites. 2. Cirrhotic morphology of the liver with atrophic left hepatic lobe and irregular heterogenous hypodensity in the posterior right hepatic lobe, corresponding to the patient's known history of metastatic disease. Findings appear slightly progressive compared with CT from August. 3. 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 4. Incompletely visualized chronic large loculated left pleural effusion with partial left lower lobe consolidation. 5. No acute osseous abnormality. No new suspicious bone lesion by CT. Electronically Signed   By: Jasmine Pang M.D.   On: 08/13/2023 20:50   DG Chest 2 View  Result Date: 08/13/2023 CLINICAL DATA:  History of pleural effusion in lung cancer EXAM: CHEST - 2 VIEW COMPARISON:  05/28/2023 FINDINGS: Right chest port with the catheter tip at superior cavoatrial junction. Unchanged cardiac and mediastinal contours. Large left pleural effusion, overall unchanged from prior exam. No new focal pulmonary opacity in the right lung. No right pleural effusion. No pneumothorax bilaterally. No acute osseous abnormality. IMPRESSION: Large left pleural effusion, overall unchanged from prior exam. Electronically Signed   By: Wiliam Ke M.D.   On: 08/13/2023 11:40    ASSESSMENT & PLAN:   Cancer of lower lobe of left lung  (HCC) # STAGE IV- [JUNE 2022] Left lower lobe lung cancer-adenocarcinoma the lung;  EGFR- 19 del POSITIVE. s /p second opinion at [facility]- Dr.Stinchcomb.  OCT 1st, 2024- . Widespread hypermetabolic poorly marginated bilobar liver metastases, increased in size and metabolism since 04/22/2023 PET-CT; New hypermetabolic lytic posterior T3 vertebral metastasis;  New hypermetabolic subcentimeter bilateral level 2 neck lymph nodes, indeterminate for reactive versus metastatic nodes.Interlobular septal thickening throughout the right lung is mildly increased, cannot exclude lymphangitic carcinomatosis. Stable mild patchy hypermetabolism associated with bandlike chronic consolidation in the left lower lobe, favoring inflammatory uptake. Chronic loculated large left pleural effusion with smooth left pleural thickening. Currently on Carboplatin-Alimta-Amivatimab [since Oct 2024]  # on Carboplatin-Alimta-Amivatimab; proceed with cycle #1- day-15-Ami today. Labs-CBC/chemistries were reviewed with the patient.  # Hyponatremia 125; 1 week ago 129-recommend increased oral intake of electrolytes.  Monitor closely.   # Severe neutropenia-secondary chemotherapy- s/p zarxio for 3 injections- today with Leucocytosis- no infections. .   # Abdominal discomfort/ascites [CT scan- NOv 2024]- s/p paracentesis x2-cytology positive for adenocarcinoma.   # new rash to his face around his nose and mouth-se cto chemo- kenalog - stable.   #Asymptomatic multiple brain mets subcentimeter asymptomatic-  -s/p SBRT SEP 2023- [[place]].  S/p SBRT [[place]]- FEB 12th, 2024. July 9th, 2024-  Stable clinically. Decreased conspicuity of all enhancing lesions with some lesions described on the prior no longer visible and others less visible. No new lesions identified  S/p evaluation with neuro-oncology- MRI Brain- OCT, 2024-clinically stable. s/p evaluation with Dr.Vaslow.   # bone metastases-Hypocalcemia: mild- continue calcium 1200 plus vitamin D-3  1000-over-the-counter-1 pill a day.  APRIL 2024-vit D 25-OH-28- on Ergocalciferol progressive disease as above.  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 Recommend re-starting Eliquis 2.5 mg BID [platelets- 189].   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   # Fatigue: s/p evaluation with [facility]- started on ritalin-   # IV access/ port malfunction-s/p tPA.   [Tuesday/wed]- pref B12  on 10/23  # DISPOSITION: # chemo today-  # as per IS- follow up in 1 weeks- - MD; labs- cbc/cmp; chemo- carbo-alimta- ami; - Dr.B      All questions were answered. The patient knows to call the clinic with any problems, questions or concerns.    [name], MD 08/30/2023 9:41 AM

## 2023-08-30 NOTE — Progress Notes (Signed)
Pt states stomach is better but feels like it is filling up with fluid.  Having trouble sleeping, takes nausea meds.  Appetite 25% normal, no supplements.  Constipation, no supplements, has BM 4-5 days per week.  Is there a nausea med that doesn't cause so much sleepiness.  Talking to [facility]. Advised a rx stimulant, ritalin for energy.

## 2023-08-30 NOTE — Assessment & Plan Note (Addendum)
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 s /p second opinion at [facility]- Dr.Stinchcomb.  OCT 1st, 2024- . Widespread hypermetabolic poorly marginated bilobar liver metastases, increased in size and metabolism since 04/22/2023 PET-CT; New hypermetabolic lytic posterior T3 vertebral metastasis;  New hypermetabolic subcentimeter bilateral level 2 neck lymph nodes, indeterminate for reactive versus metastatic nodes.Interlobular septal thickening throughout the right lung is mildly increased, cannot exclude lymphangitic carcinomatosis. Stable mild patchy hypermetabolism associated with bandlike chronic consolidation in the left lower lobe, favoring inflammatory uptake. Chronic loculated large left pleural effusion with smooth left pleural thickening. Currently on Carboplatin-Alimta-Amivatimab [since Oct 2024]  # on Carboplatin-Alimta-Amivatimab; proceed with cycle #1- day-15-Ami today. Labs-CBC/chemistries were reviewed with the patient.  # Hyponatremia 125; 1 week ago 129-recommend increased oral intake of electrolytes.  Monitor closely.   # Severe neutropenia-secondary chemotherapy- s/p zarxio for 3 injections- today with Leucocytosis- no infections. .   # Abdominal discomfort/ascites [CT scan- NOv 2024]- s/p paracentesis x2-cytology positive for adenocarcinoma.   # new rash to his face around his nose and mouth-se cto chemo- kenalog - stable.   #Asymptomatic multiple brain mets subcentimeter asymptomatic-  -s/p SBRT SEP 2023- [[place]].  S/p SBRT [[place]]- FEB 12th, 2024. July 9th, 2024-  Stable clinically. Decreased conspicuity of all enhancing lesions with some lesions described on the prior no longer visible and others less visible. No new lesions identified  S/p evaluation with neuro-oncology- MRI Brain- OCT, 2024-clinically stable. s/p evaluation with Dr.Vaslow.   # bone metastases-Hypocalcemia: mild- continue calcium 1200 plus vitamin D-3  1000-over-the-counter-1 pill a day.  APRIL 2024-vit D 25-OH-28- on Ergocalciferol progressive disease as above.  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 Recommend re-starting Eliquis 2.5 mg BID [platelets- 189].   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   # Fatigue: s/p evaluation with cerula care- started on ritalin-   # IV access/ port malfunction-s/p tPA.   [Tuesday/wed]- pref B12  on 10/23  # DISPOSITION: # chemo today-  # as per IS- follow up in 1 weeks- - MD; labs- cbc/cmp; chemo- carbo-alimta- ami; - Dr.B

## 2023-09-01 ENCOUNTER — Other Ambulatory Visit: Payer: Self-pay | Admitting: Internal Medicine

## 2023-09-01 ENCOUNTER — Other Ambulatory Visit: Payer: Self-pay | Admitting: Hospice and Palliative Medicine

## 2023-09-02 ENCOUNTER — Encounter: Payer: Self-pay | Admitting: Nurse Practitioner

## 2023-09-02 ENCOUNTER — Encounter: Payer: Self-pay | Admitting: Internal Medicine

## 2023-09-02 MED ORDER — OLANZAPINE 5 MG PO TABS: 5.0000 mg | ORAL_TABLET | Freq: Every evening | ORAL | 0 refills | Status: DC | PRN

## 2023-09-04 ENCOUNTER — Encounter: Payer: Self-pay | Admitting: Internal Medicine

## 2023-09-05 MED FILL — Fosaprepitant Dimeglumine For IV Infusion 150 MG (Base Eq): INTRAVENOUS | Qty: 5 | Status: AC

## 2023-09-06 ENCOUNTER — Encounter: Payer: Self-pay | Admitting: Internal Medicine

## 2023-09-06 ENCOUNTER — Ambulatory Visit
Admission: RE | Admit: 2023-09-06 | Discharge: 2023-09-06 | Disposition: A | Discharge status: Home / Self Care | Payer: Managed Care, Other (non HMO) | Source: Ambulatory Visit | Attending: Internal Medicine | Admitting: Internal Medicine

## 2023-09-06 ENCOUNTER — Inpatient Hospital Stay: Payer: Managed Care, Other (non HMO)

## 2023-09-06 ENCOUNTER — Inpatient Hospital Stay (HOSPITAL_BASED_OUTPATIENT_CLINIC_OR_DEPARTMENT_OTHER): Payer: Managed Care, Other (non HMO) | Admitting: Internal Medicine

## 2023-09-06 VITALS — BP 128/89 | HR 107 | Temp 96.6°F | Ht 71.0 in | Wt 215.4 lb

## 2023-09-06 VITALS — BP 117/76 | HR 88

## 2023-09-06 DIAGNOSIS — C3432 Malignant neoplasm of lower lobe, left bronchus or lung: Secondary | ICD-10-CM

## 2023-09-06 DIAGNOSIS — R180 Malignant ascites: Secondary | ICD-10-CM

## 2023-09-06 DIAGNOSIS — Z5112 Encounter for antineoplastic immunotherapy: Secondary | ICD-10-CM | POA: Diagnosis not present

## 2023-09-06 LAB — CMP (CANCER CENTER ONLY)
ALT: 92 U/L — ABNORMAL HIGH (ref 0–44)
AST: 91 U/L — ABNORMAL HIGH (ref 15–41)
Albumin: 2.2 g/dL — ABNORMAL LOW (ref 3.5–5.0)
Alkaline Phosphatase: 349 U/L — ABNORMAL HIGH (ref 38–126)
Anion gap: 12 (ref 5–15)
BUN: 10 mg/dL (ref 6–20)
CO2: 25 mmol/L (ref 22–32)
Calcium: 7.9 mg/dL — ABNORMAL LOW (ref 8.9–10.3)
Chloride: 90 mmol/L — ABNORMAL LOW (ref 98–111)
Creatinine: 0.74 mg/dL (ref 0.61–1.24)
GFR, Estimated: >60 mL/min (ref >60)
Glucose, Bld: 128 mg/dL — ABNORMAL HIGH (ref 70–99)
Potassium: 3.6 mmol/L (ref 3.5–5.1)
Sodium: 127 mmol/L — ABNORMAL LOW (ref 135–145)
Total Bilirubin: 1.6 mg/dL — ABNORMAL HIGH (ref <1.2)
Total Protein: 5.6 g/dL — ABNORMAL LOW (ref 6.5–8.1)

## 2023-09-06 LAB — CBC WITH DIFFERENTIAL (CANCER CENTER ONLY)
Abs Immature Granulocytes: 0.36 10*3/uL — ABNORMAL HIGH (ref 0.00–0.07)
Basophils Absolute: 0.2 10*3/uL — ABNORMAL HIGH (ref 0.0–0.1)
Basophils Relative: 1 %
Eosinophils Absolute: 0 10*3/uL (ref 0.0–0.5)
Eosinophils Relative: 0 %
HCT: 40.9 % (ref 39.0–52.0)
Hemoglobin: 14.2 g/dL (ref 13.0–17.0)
Immature Granulocytes: 3 %
Lymphocytes Relative: 12 %
Lymphs Abs: 1.6 10*3/uL (ref 0.7–4.0)
MCH: 31.9 pg (ref 26.0–34.0)
MCHC: 34.7 g/dL (ref 30.0–36.0)
MCV: 91.9 fL (ref 80.0–100.0)
Monocytes Absolute: 1.8 10*3/uL — ABNORMAL HIGH (ref 0.1–1.0)
Monocytes Relative: 13 %
Neutro Abs: 9.6 10*3/uL — ABNORMAL HIGH (ref 1.7–7.7)
Neutrophils Relative %: 71 %
Platelet Count: 200 10*3/uL (ref 150–400)
RBC: 4.45 MIL/uL (ref 4.22–5.81)
RDW: 14.8 % (ref 11.5–15.5)
WBC Count: 13.5 10*3/uL — ABNORMAL HIGH (ref 4.0–10.5)
nRBC: 0 % (ref 0.0–0.2)

## 2023-09-06 MED ORDER — SODIUM CHLORIDE 0.9 % IV SOLN
Freq: Once | INTRAVENOUS | Status: AC
  Filled 2023-09-06: qty 250

## 2023-09-06 MED ORDER — SODIUM CHLORIDE 0.9 % IV SOLN
1750.0000 mg | Freq: Once | INTRAVENOUS | Status: AC
  Administered 2023-09-06: 1750 mg via INTRAVENOUS
  Filled 2023-09-06: qty 35

## 2023-09-06 MED ORDER — MONTELUKAST SODIUM 10 MG PO TABS
10.0000 mg | ORAL_TABLET | Freq: Once | ORAL | Status: AC
Start: 2023-09-06 — End: 2023-09-06
  Administered 2023-09-06: 10 mg via ORAL
  Filled 2023-09-06: qty 1

## 2023-09-06 MED ORDER — FAMOTIDINE IN NACL 20-0.9 MG/50ML-% IV SOLN
20.0000 mg | Freq: Once | INTRAVENOUS | Status: AC
  Administered 2023-09-06: 20 mg via INTRAVENOUS
  Filled 2023-09-06: qty 50

## 2023-09-06 MED ORDER — DEXAMETHASONE SODIUM PHOSPHATE 10 MG/ML IJ SOLN
10.0000 mg | Freq: Once | INTRAMUSCULAR | Status: AC
  Administered 2023-09-06: 10 mg via INTRAVENOUS
  Filled 2023-09-06: qty 1

## 2023-09-06 MED ORDER — PALONOSETRON HCL INJECTION 0.25 MG/5ML
0.2500 mg | Freq: Once | INTRAVENOUS | Status: AC
  Administered 2023-09-06: 0.25 mg via INTRAVENOUS
  Filled 2023-09-06: qty 5

## 2023-09-06 MED ORDER — SODIUM CHLORIDE 0.9 % IV SOLN
500.0000 mg/m2 | Freq: Once | INTRAVENOUS | Status: AC
  Administered 2023-09-06: 1100 mg via INTRAVENOUS
  Filled 2023-09-06: qty 4

## 2023-09-06 MED ORDER — HEPARIN SOD (PORK) LOCK FLUSH 100 UNIT/ML IV SOLN
500.0000 [IU] | Freq: Once | INTRAVENOUS | Status: AC | PRN
  Administered 2023-09-06: 500 [IU]
  Filled 2023-09-06: qty 5

## 2023-09-06 MED ORDER — SODIUM CHLORIDE 0.9 % IV SOLN
150.0000 mg | Freq: Once | INTRAVENOUS | Status: AC
  Administered 2023-09-06: 150 mg via INTRAVENOUS
  Filled 2023-09-06: qty 150

## 2023-09-06 MED ORDER — LIDOCAINE HCL (PF) 1 % IJ SOLN
10.0000 mL | Freq: Once | INTRAMUSCULAR | Status: AC
  Administered 2023-09-06: 10 mL
  Filled 2023-09-06: qty 10

## 2023-09-06 MED ORDER — SODIUM CHLORIDE 0.9 % IV SOLN
750.0000 mg | Freq: Once | INTRAVENOUS | Status: AC
  Administered 2023-09-06: 750 mg via INTRAVENOUS
  Filled 2023-09-06: qty 75

## 2023-09-06 MED ORDER — DIPHENHYDRAMINE HCL 25 MG PO CAPS
50.0000 mg | ORAL_CAPSULE | Freq: Once | ORAL | Status: AC
  Administered 2023-09-06: 50 mg via ORAL
  Filled 2023-09-06: qty 2

## 2023-09-06 MED ORDER — ACETAMINOPHEN 325 MG PO TABS
650.0000 mg | ORAL_TABLET | Freq: Once | ORAL | Status: AC
  Administered 2023-09-06: 650 mg via ORAL
  Filled 2023-09-06: qty 2

## 2023-09-06 NOTE — Assessment & Plan Note (Addendum)
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 s /p second opinion at [facility]- Dr.Stinchcomb.  OCT 1st, 2024- . Widespread hypermetabolic poorly marginated bilobar liver metastases, increased in size and metabolism since 04/22/2023 PET-CT; New hypermetabolic lytic posterior T3 vertebral metastasis;  New hypermetabolic subcentimeter bilateral level 2 neck lymph nodes, indeterminate for reactive versus metastatic nodes.Interlobular septal thickening throughout the right lung is mildly increased, cannot exclude lymphangitic carcinomatosis. Stable mild patchy hypermetabolism associated with bandlike chronic consolidation in the left lower lobe, favoring inflammatory uptake. Chronic loculated large left pleural effusion with smooth left pleural thickening. Currently on Carboplatin-Alimta-Amivatimab [since Oct 2024]  # on Carboplatin-Alimta-Amivatimab; proceed with cycle #2- day-1- Carboplatin-Alimta-Amivatimab Labs-CBC/chemistries were reviewed with the patient.  # Hyponatremia 127; 1 week ago 129-recommend increased oral intake of electrolytes.  Monitor closely- stable.   # Severe neutropenia-secondary chemotherapy- s/p zarxio for 3 injections- today with Leucocytosis- no infections- stable.   # Abdominal discomfort/ascites [CT scan- NOv 2024]- s/p paracentesis x2-cytology positive for adenocarcinoma- proceed with paracentesis; declines pleurex catheter.  # History of left pleural effusion worsening shortness of breath-get chest x-ray consider thoracentesis on the left side.  Ordered   # new rash to his face around his nose and mouth-se cto chemo- kenalog - stable.   #Asymptomatic multiple brain mets subcentimeter asymptomatic-  -s/p SBRT SEP 2023- [[place]].  S/p SBRT [[place]]- FEB 12th, 2024. July 9th, 2024-  Stable clinically. Decreased conspicuity of all enhancing lesions with some lesions described on the prior no longer visible and others less visible. No  new lesions identified  S/p evaluation with neuro-oncology- MRI Brain- OCT, 2024-clinically stable. s/p evaluation with Dr.Vaslow.   # bone metastases-Hypocalcemia: mild- continue calcium 1200 plus vitamin D-3 1000-over-the-counter-1 pill a day.  APRIL 2024-vit D 25-OH-28- on Ergocalciferol progressive disease as above.    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 On  Eliquis 2.5 mg BID [platelets- 189].   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   # Fatigue: s/p evaluation with cerula care- started on ritalin-   # IV access/ port malfunction-s/p tPA.   # I had a long discussion with the patient and his wife regarding the overall poor prognosis of patients;s underlying malignancy given lack of significant improvement of clinical status.  Discussed my concerns of liver cirrhosis/and also modest benefit from current chemotherapy.  Also discussed regarding CODE STATUS; and also discussed my recommendations against full code given the overall futility of the heroic measures.  Recommend evaluation with palliative care at next visit.  [Tuesday/wed]- pref B12  on 10/23  # DISPOSITION: # paracentesis ASAP # left sided Thoracentesis ASAP # CXR EX:[id number] effusion/ # chemo today # follow up in week on dec 2nd/3rd- Lauren- labs- cbc/cmp # follow up with [name] on  2nd/3rd- re: palliative care discussion # as per IS- follow up in 3  weeks- - MD; labs- cbc/cmp; chemo- carbo-alimta- ami-  Dr.B  # 40 minutes face-to-face with the patient discussing the above plan of care; more than 50% of time spent on prognosis/ natural history; counseling and coordination.

## 2023-09-06 NOTE — Progress Notes (Signed)
[facility] CONSULT NOTE  Patient Care Team: Pcp, No as PCP - General [name], RN as Oncology Nurse Navigator [name], MD as Consulting Physician (Internal Medicine)  CHIEF COMPLAINTS/PURPOSE OF CONSULTATION: Lung cancer  #  Oncology History Overview Note  IMPRESSION: 1. Patchy nodular fat stranding throughout the anterior left upper quadrant peritoneal fat, nonspecific, cannot exclude peritoneal carcinomatosis. Dedicated CT abdomen/pelvis with oral and IV contrast recommended for further evaluation. 2. Dense patchy consolidation replacing much of the left lower lung lobe, appearing masslike in the superior segment left lower lobe, with associated bulging of the left major fissure and associated left lower lobe volume loss. Fine nodularity throughout both lungs with an upper lobe predominance. Asymmetric left upper lobe interlobular septal thickening. These findings are indeterminate, with differential including multilobar pneumonia, sarcoidosis or a neoplastic process. The persistence on radiographs back to 01/20/2021 despite antibiotic therapy make sarcoidosis or a neoplastic process more likely. Pulmonology consultation suggested for consideration of bronchoscopic evaluation. 3. Small dependent left pleural effusion. 4. Mild mediastinal lymphadenopathy, nonspecific. 5. Subacute healing lateral right sixth rib fracture.   DIAGNOSIS:  A. LUNG, LEFT LOWER LOBE; ENB-ASSISTED BIOPSY:  - NON-SMALL CELL CARCINOMA, FAVOR ADENOCARCINOMA.  - FOREIGN MATERIAL SUGGESTIVE OF ASPIRATION.   # EGFR MUTATED: 36 del- June 28th, 2022- [name]. [limited]  # Asymptomatic multiple brain mets subcentimeter asymptomatic -JUNE 29th, 2023- Numerous metastatic lesions in the supratentorial brain-increase in number also conspicuity compared to January 2023.  Pre-SBRT MRI brain AUG 18th, 2023-  Eighteen small enhancing brain metastases (punctate to 11 mm); were all  present on 03/28/2021-s/p SBRT SEP 2023- [[place]]; SRS/ radiation treatment for brain metastasis He finished treatment on 11-26-22.   # SEP-OCT-worsening left-sided pleural effusion-status post paracentesis; exudative; Cytology negative- ?  Progressive disease.  # NOV 2023- JAN 2024 CARBO-TAXOL-BEV+ATEZO x4 cyles- BEV+ATEZO- until APRIL 2024  # STAGE IV- [JUNE 2022] Left lower lobe lung cancer-adenocarcinoma the lung;  EGFR- 19 del POSITIVE.  While on osimertinib- NOV 1st, 2023- PET scan- shows progressive disease in liver. Liquid Biopsy/Guardant testing-positive for EGFR mutation; otherwise Negative for any novel mutations.  Repeat liver Biopsy positive for adenocarcinoma. NEGATIVE for MET amplification.  S/p second opinion at [facility]- Dr.Stinchcomb. status post 4 cycles of carbo Taxol-Atezo+Bev- JAN 25th, 2024-PET- Interval response to therapy with resolution of previously demonstrated extensive hypermetabolic activity throughout the liver. PARIL 2024- ? Partial response, but clinically worse/rising LFTs  # MAY 6th, Brain MRI-progressive multiple brain metastases  # MAY 16th, 2024- Alimta+Avastin #1    Cancer of lower lobe of left lung (HCC)  03/23/2021 Initial Diagnosis   Cancer of lower lobe of left lung (HCC)   03/29/2021 Cancer Staging   Staging form: Lung, AJCC 8th Edition - Clinical: Stage IVB (cT3, cN3, pM1c) - Signed by [name], MD on 03/29/2021   03/30/2021 - 03/30/2021 Chemotherapy   Patient is on Treatment Plan : LUNG NSCLC Pemetrexed (Alimta) / Carboplatin q21d x 1 cycles     08/17/2022 - 01/15/2023 Chemotherapy   Patient is on Treatment Plan : LUNG Atezolizumab + Bevacizumab + Carboplatin + Paclitaxel q21d Induction x 4 cycles / Atezolizumab + Bevacizumab q21d Maintenance     02/28/2023 - 07/02/2023 Chemotherapy   Patient is on Treatment Plan : LUNG NON-SMALL CELL Bevacizumab + Pemetrexed q21d     08/07/2023 -  Chemotherapy   Patient is on Treatment Plan : LUNG NSCLC  Pemetrexed + Carboplatin + Amivantamab q21d x 4 Cycles / Pemetrexed + Amivantamab  q21d      HISTORY OF PRESENTING ILLNESS: Ambulating independently.  Accompanied by his mother.   Nathan Iha 42 y.o.  male patient with stage IV lung cancer adenocarcinoma-brain mets [synchronous] bone mets EGFR mutated -most recently noted to have progression on Alimta- Bev is here for follow-up/and proceed with chemotherapy with carboplatin-Alimta- Ami is here for a follow up.  Nausea and vomiting last night and this morning. No fever.  Patient has poor appetite.    Patient notes to have SOB getting worse with activity denies any cough.  S/p evaluation with psychiatry-recommended methylphenidate for ongoing fatigue.  Patient stated that has noticed a new rash to his face around his nose and mouth.- on kenalog.    No worsening headaches.  Review of Systems  Constitutional:  Positive for malaise/fatigue. Negative for chills, diaphoresis and fever.  HENT:  Negative for nosebleeds and sore throat.   Eyes:  Negative for double vision.  Respiratory:  Positive for cough. Negative for wheezing.   Cardiovascular:  Negative for chest pain, palpitations, orthopnea and leg swelling.  Gastrointestinal:  Positive for heartburn and nausea. Negative for abdominal pain, blood in stool, diarrhea and melena.  Genitourinary:  Negative for dysuria, frequency and urgency.  Musculoskeletal:  Negative for back pain and joint pain.  Neurological:  Negative for tingling and focal weakness.  Endo/Heme/Allergies:  Does not bruise/bleed easily.  Psychiatric/Behavioral:  Negative for depression. The patient is not nervous/anxious and does not have insomnia.      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    SURGICAL HISTORY: Past Surgical History:  Procedure Laterality Date   FRACTURE SURGERY      broke femur when he was 6 years   PORTA CATH INSERTION N/A 03/28/2021   Procedure: PORTA CATH INSERTION;  Surgeon: Renford Dills, MD;  Location: [facility] INVASIVE CV LAB;  Service: Cardiovascular;  Laterality: N/A;   VIDEO BRONCHOSCOPY WITH ENDOBRONCHIAL NAVIGATION N/A 03/15/2021   Procedure: VIDEO BRONCHOSCOPY WITH ENDOBRONCHIAL NAVIGATION;  Surgeon: Vida Rigger, MD;  Location: [facility] ORS;  Service: Thoracic;  Laterality: N/A;   VIDEO BRONCHOSCOPY WITH ENDOBRONCHIAL ULTRASOUND N/A 03/15/2021   Procedure: VIDEO BRONCHOSCOPY WITH ENDOBRONCHIAL ULTRASOUND;  Surgeon: Vida Rigger, MD;  Location: [facility] ORS;  Service: Thoracic;  Laterality: N/A;    SOCIAL HISTORY: Social History   Socioeconomic History   Marital status: Married    Spouse name: Darl Pikes   Number of children: 2   Years of education: Not on file   Highest education level: Not on file  Occupational History   Not on file  Tobacco Use   Smoking status: Never   Smokeless tobacco: Former    Types: Snuff, Chew  Vaping Use   Vaping status: Never Used  Substance and Sexual Activity   Alcohol use: Never   Drug use: Never   Sexual activity: Yes  Other Topics Concern   Not on file  Social History Narrative   [place]; with wife; 2 daughters[12 and 22]; never smoked; rare alcohol. 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 Dog and cat.   Social Determinants of Health   Financial Resource Strain: Low Risk  (05/10/2022)   Overall Financial Resource Strain (CARDIA)    Difficulty of Paying Living Expenses: Not hard at all  Food Insecurity: No Food Insecurity (05/10/2022)   Hunger Vital Sign  Worried About Programme researcher, broadcasting/film/video in the Last Year: Never true    Ran Out of Food in the Last Year: Never true  Transportation Needs: No Transportation Needs (03/16/2022)   PRAPARE - Administrator, Civil Service (Medical): No    Lack of Transportation (Non-Medical): No  Physical Activity: Not on file  Stress: Not on file  Social Connections:  Socially Integrated (05/10/2022)   Social Connection and Isolation Panel [NHANES]    Frequency of Communication with Friends and Family: More than three times a week    Frequency of Social Gatherings with Friends and Family: More than three times a week    Attends Religious Services: More than 4 times per year    Active Member of [employer] or Organizations: Yes    Attends Engineer, structural: More than 4 times per year    Marital Status: Married  Catering manager Violence: Not on file    FAMILY HISTORY: Family History  Problem Relation Age of Onset   High Cholesterol Mother    Hypertension Father    Lung cancer Father    Skin cancer Father     ALLERGIES:  is allergic to codeine.  MEDICATIONS:  Current Outpatient Medications  Medication Sig Dispense Refill   albuterol (VENTOLIN HFA) 108 (90 Base) MCG/ACT inhaler Inhale 2 puffs into the lungs every 6 (six) hours as needed for wheezing or shortness of breath. 8 g 2   calcium carbonate (TUMS EX) 750 MG chewable tablet Chew 1 tablet by mouth daily.     ELIQUIS 2.5 MG TABS tablet TAKE 1 TABLET BY MOUTH TWICE A DAY 60 tablet 4   folic acid (FOLVITE) 1 MG tablet TAKE 1 TABLET BY MOUTH EVERY DAY 90 tablet 1   lidocaine-prilocaine (EMLA) cream Apply 1 Application topically as needed. 30 g 0   methylphenidate (RITALIN) 5 MG tablet Take 1 tablet (5 mg total) by mouth 2 (two) times daily as needed (fatigue). 60 tablet 0   metoprolol succinate (TOPROL-XL) 50 MG 24 hr tablet Take 50 mg by mouth daily. Take with or immediately following a meal.     montelukast (SINGULAIR) 10 MG tablet TAKE 1 TABLET BY MOUTH EVERYDAY AT BEDTIME 90 tablet 1   OLANZapine (ZYPREXA) 5 MG tablet Take 1 tablet (5 mg total) by mouth at bedtime as needed (nausea). 30 tablet 0   ondansetron (ZOFRAN-ODT) 4 MG disintegrating tablet Take 1 tablet (4 mg total) by mouth every 8 (eight) hours as needed for nausea or vomiting. 20 tablet 1   sertraline (ZOLOFT) 50 MG tablet  Take 1 tablet (50 mg total) by mouth daily. 30 tablet 1   traMADol (ULTRAM) 50 MG tablet Take 1 tablet (50 mg total) by mouth every 12 (twelve) hours as needed. 30 tablet 0   triamcinolone ointment (KENALOG) 0.5 % Apply 1 Application topically 2 (two) times daily. 30 g 1   Vitamin D, Ergocalciferol, (DRISDOL) 1.25 MG (50000 UNIT) CAPS capsule TAKE 1 CAPSULE BY MOUTH ONE TIME PER WEEK 12 capsule 1   No current facility-administered medications for this visit.   Facility-Administered Medications Ordered in Other Visits  Medication Dose Route Frequency Provider Last Rate Last Admin   amivantamab-vmjw (RYBREVANT) 1,750 mg in sodium chloride 0.9 % 215 mL chemo infusion  1,750 mg Intravenous Once Louretta Shorten R, MD       CARBOplatin (PARAPLATIN) 750 mg in sodium chloride 0.9 % 250 mL chemo infusion  750 mg Intravenous Once Maedell Hedger R,  MD       famotidine (PEPCID) IVPB 20 mg premix  20 mg Intravenous Once Louretta Shorten R, MD 200 mL/hr at 09/06/23 1020 20 mg at 09/06/23 1020   heparin lock flush 100 UNIT/ML injection            heparin lock flush 100 UNIT/ML injection            heparin lock flush 100 unit/mL  500 Units Intravenous Once Borders, Daryl Eastern, NP       PEMEtrexed (ALIMTA) 1,100 mg in sodium chloride 0.9 % 100 mL chemo infusion  500 mg/m2 (Treatment Plan Recorded) Intravenous Once Louretta Shorten R, MD       sodium chloride flush (NS) 0.9 % injection 10 mL  10 mL Intravenous PRN Louretta Shorten R, MD   10 mL at 07/28/21 0852   sodium chloride flush (NS) 0.9 % injection 10 mL  10 mL Intravenous Once Borders, Daryl Eastern, NP          .  PHYSICAL EXAMINATION: ECOG PERFORMANCE STATUS: 1 - Symptomatic but completely ambulatory  Vitals:   09/06/23 0850  BP: 128/89  Pulse: (!) 107  Temp: (!) 96.6 F (35.9 C)  SpO2: 98%     Filed Weights   09/06/23 0850  Weight: 215 lb 6.4 oz (97.7 kg)       Physical Exam HENT:     Head: Normocephalic and  atraumatic.     Mouth/Throat:     Pharynx: No oropharyngeal exudate.  Eyes:     Pupils: Pupils are equal, round, and reactive to light.  Cardiovascular:     Rate and Rhythm: Normal rate and regular rhythm.  Pulmonary:     Effort: No respiratory distress.     Breath sounds: No wheezing.     Comments: Decreased breath sound on the left side compared to right. Abdominal:     General: Bowel sounds are normal. There is no distension.     Palpations: Abdomen is soft. There is no mass.     Tenderness: There is no abdominal tenderness. There is no guarding or rebound.  Musculoskeletal:        General: No tenderness. Normal range of motion.     Cervical back: Normal range of motion and neck supple.  Skin:    General: Skin is warm.  Neurological:     Mental Status: He is alert and oriented to person, place, and time.  Psychiatric:        Mood and Affect: Affect normal.    Fungal dermatitis bilateral buttocks groin/underneath abdominal pannus  LABORATORY DATA:  I have reviewed the data as listed Lab Results  Component Value Date   WBC 13.5 (H) 09/06/2023   HGB 14.2 09/06/2023   HCT 40.9 09/06/2023   MCV 91.9 09/06/2023   PLT 200 09/06/2023   Recent Labs    08/15/23 0821 08/22/23 0853 08/30/23 0801 09/06/23 0831  NA 134* 129* 125* 127*  K 3.5 3.2* 3.4* 3.6  CL 101 94* 89* 90*  CO2 26 27 29 25   GLUCOSE 107* 103* 104* 128*  BUN 11 10 8 10   CREATININE 0.55* 0.61 0.64 0.74  CALCIUM 8.4* 7.8* 7.7* 7.9*  GFRNONAA >60 >60 >60 >60  PROT 6.1* 5.8* 5.4* 5.6*  ALBUMIN 2.8* 2.5* 2.3* 2.2*  AST 140* 112* 103* 91*  ALT 109* 119* 87* 92*  ALKPHOS 333* 324* 350* 349*  BILITOT 2.3*  2.3* 1.1 1.1 1.6*  BILIDIR 0.9*  --   --   --  IBILI 1.4*  --   --   --     RADIOGRAPHIC STUDIES: I have personally reviewed the radiological images as listed and agreed with the findings in the report. US Paracentesis  Result Date: 08/23/2023 INDICATION: Malignant ascites. Request received for  diagnostic and therapeutic paracentesis EXAM: ULTRASOUND GUIDED  PARACENTESIS MEDICATIONS: 10 cc 1% lidocaine COMPLICATIONS: None immediate. PROCEDURE: Informed written consent was obtained from the patient after a discussion of the risks, benefits and alternatives to treatment. A timeout was performed prior to the initiation of the procedure. Initial ultrasound scanning demonstrates a large amount of ascites within the right lower abdominal quadrant. The right lower abdomen was prepped and draped in the usual sterile fashion. 1% lidocaine was used for local anesthesia. Following this, a 19 gauge, 10-cm, Yueh catheter was introduced. An ultrasound image was saved for documentation purposes. The paracentesis was performed. The catheter was removed and a dressing was applied. The patient tolerated the procedure well without immediate post procedural complication. FINDINGS: A total of approximately 4.1 L of amber fluid was removed. Samples were sent to the laboratory as requested by the clinical team. IMPRESSION: Successful ultrasound-guided paracentesis yielding 4.1 liters of peritoneal fluid. Procedure performed by Mina Marble, PA-C Electronically Signed   By: Olive Bass M.D.   On: 08/23/2023 16:43   US Paracentesis  Result Date: 08/14/2023 INDICATION: Patient with metastatic lung cancer presents today with ascites. Therapeutic paracentesis requested. EXAM: ULTRASOUND GUIDED PARACENTESIS MEDICATIONS: 1% lidocaine 10 mL COMPLICATIONS: None immediate. PROCEDURE: Informed written consent was obtained from the patient after a discussion of the risks, benefits and alternatives to treatment. A timeout was performed prior to the initiation of the procedure. Initial ultrasound scanning demonstrates a large amount of ascites within the right lower abdominal quadrant. The right lower abdomen was prepped and draped in the usual sterile fashion. 1% lidocaine was used for local anesthesia. Following this, a 19 gauge,  7-cm, Yueh catheter was introduced. An ultrasound image was saved for documentation purposes. The paracentesis was performed. The catheter was removed and a dressing was applied. The patient tolerated the procedure well without immediate post procedural complication. FINDINGS: A total of approximately 5.2 L of amber colored fluid was removed. IMPRESSION: Successful ultrasound-guided paracentesis yielding 5.2 liters of peritoneal fluid. Procedure performed by Alwyn Ren NP Electronically Signed   By: Olive Bass M.D.   On: 08/14/2023 15:26   CT ABDOMEN PELVIS W CONTRAST  Result Date: 08/13/2023 CLINICAL DATA:  Left-sided abdominal pain metastatic disease distension EXAM: CT ABDOMEN AND PELVIS WITH CONTRAST TECHNIQUE: Multidetector CT imaging of the abdomen and pelvis was performed using the standard protocol following bolus administration of intravenous contrast. RADIATION DOSE REDUCTION: This exam was performed according to the departmental dose-optimization program which includes automated exposure control, adjustment of the mA and/or kV according to patient size and/or use of iterative reconstruction technique. CONTRAST:  OMNIPAQUE IOHEXOL 300 MG/ML  SOLN COMPARISON:  PET CT 07/16/2023, CT abdomen pelvis 05/27/2023, CT 06/27/2021, MRI 09/04/2022 FINDINGS: Lower chest: Lung bases demonstrate incompletely visualized chronic large loculated left pleural effusion. Partial left lower lobe consolidation. Hepatobiliary: Atrophic hypodense appearing left hepatic lobe and irregular heterogenous hypodensity in the posterior right hepatic lobe, corresponding to the patient's history of metastatic disease. Liver appears grossly nodular consistent with cirrhotic morphology. No calcified gallstone or biliary dilatation. Findings appear slightly progressive compared with CT from August. Pancreas: Unremarkable. No pancreatic ductal dilatation or surrounding inflammatory changes. Spleen: Upper normal in size  measuring 13 cm  oblique craniocaudad. Adrenals/Urinary Tract: Adrenal glands are normal. Kidneys show no hydronephrosis. The bladder is unremarkable Stomach/Bowel: The stomach is nonenlarged. No dilated small bowel. No acute bowel wall thickening. Vascular/Lymphatic: Nonaneurysmal aorta. No suspicious lymph nodes. 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 Main and right portal veins appear patent. Reproductive: Prostate is unremarkable. Other: No free air.  Interval large volume abdominopelvic ascites. Musculoskeletal: Scattered subtle foci of lytic and sclerotic change within the spine and pelvis, corresponding to history of treated skeletal metastatic disease. IMPRESSION: 1. Interval large volume abdominopelvic ascites. 2. Cirrhotic morphology of the liver with atrophic left hepatic lobe and irregular heterogenous hypodensity in the posterior right hepatic lobe, corresponding to the patient's known history of metastatic disease. Findings appear slightly progressive compared with CT from August. 3. 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 4. Incompletely visualized chronic large loculated left pleural effusion with partial left lower lobe consolidation. 5. No acute osseous abnormality. No new suspicious bone lesion by CT. Electronically Signed   By: Jasmine Pang M.D.   On: 08/13/2023 20:50   DG Chest 2 View  Result Date: 08/13/2023 CLINICAL DATA:  History of pleural effusion in lung cancer EXAM: CHEST - 2 VIEW COMPARISON:  05/28/2023 FINDINGS: Right chest port with the catheter tip at superior cavoatrial junction. Unchanged cardiac and mediastinal contours. Large left pleural effusion, overall unchanged from prior exam. No new focal pulmonary opacity in the right lung. No right pleural effusion. No pneumothorax bilaterally. No acute osseous abnormality. IMPRESSION: Large left pleural effusion, overall unchanged from prior exam. Electronically Signed   By: Wiliam Ke M.D.   On: 08/13/2023 11:40     ASSESSMENT & PLAN:   Cancer of lower lobe of left lung (HCC) # STAGE IV- [JUNE 2022] Left lower lobe lung cancer-adenocarcinoma the lung;  EGFR- 19 del POSITIVE. s /p second opinion at [facility]- Dr.Stinchcomb.  OCT 1st, 2024- . Widespread hypermetabolic poorly marginated bilobar liver metastases, increased in size and metabolism since 04/22/2023 PET-CT; New hypermetabolic lytic posterior T3 vertebral metastasis;  New hypermetabolic subcentimeter bilateral level 2 neck lymph nodes, indeterminate for reactive versus metastatic nodes.Interlobular septal thickening throughout the right lung is mildly increased, cannot exclude lymphangitic carcinomatosis. Stable mild patchy hypermetabolism associated with bandlike chronic consolidation in the left lower lobe, favoring inflammatory uptake. Chronic loculated large left pleural effusion with smooth left pleural thickening. Currently on Carboplatin-Alimta-Amivatimab [since Oct 2024]  # on Carboplatin-Alimta-Amivatimab; proceed with cycle #2- day-1- Carboplatin-Alimta-Amivatimab Labs-CBC/chemistries were reviewed with the patient.  # Hyponatremia 127; 1 week ago 129-recommend increased oral intake of electrolytes.  Monitor closely- stable.   # Severe neutropenia-secondary chemotherapy- s/p zarxio for 3 injections- today with Leucocytosis- no infections- stable.   # Abdominal discomfort/ascites [CT scan- NOv 2024]- s/p paracentesis x2-cytology positive for adenocarcinoma- proceed with paracentesis; declines pleurex catheter.  # History of left pleural effusion worsening shortness of breath-get chest x-ray consider thoracentesis on the left side.  Ordered   # new rash to his face around his nose and mouth-se cto chemo- kenalog - stable.   #Asymptomatic multiple brain mets subcentimeter asymptomatic-  -s/p SBRT SEP 2023- [[place]].  S/p SBRT [[place]]- FEB 12th, 2024. July 9th, 2024-  Stable clinically. Decreased conspicuity of all enhancing lesions with some lesions  described on the prior no longer visible and others less visible. No new lesions identified  S/p evaluation with neuro-oncology- MRI Brain- OCT, 2024-clinically stable. s/p evaluation with Dr.Vaslow.   # bone metastases-Hypocalcemia: mild- continue calcium 1200 plus vitamin D-3 1000-over-the-counter-1  pill a day.  APRIL 2024-vit D 25-OH-28- on Ergocalciferol progressive disease as above.    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 On  Eliquis 2.5 mg BID [platelets- 189].   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   # Fatigue: s/p evaluation with [facility]- started on ritalin-   # IV access/ port malfunction-s/p tPA.   # I had a long discussion with the patient and his wife regarding the overall poor prognosis of patients;s underlying malignancy given lack of significant improvement of clinical status.  Discussed my concerns of liver cirrhosis/and also modest benefit from current chemotherapy.  Also discussed regarding CODE STATUS; and also discussed my recommendations against full code given the overall futility of the heroic measures.  Recommend evaluation with palliative care at next visit.  [Tuesday/wed]- pref B12  on 10/23  # DISPOSITION: # paracentesis ASAP # left sided Thoracentesis ASAP # CXR [id number] effusion/ # chemo today # follow up in week on dec 2nd/3rd- Lauren- labs- cbc/cmp # follow up with [name] on  2nd/3rd- re: palliative care discussion # as per IS- follow up in 3  weeks- - MD; labs- cbc/cmp; chemo- carbo-alimta- ami-  Dr.B  # 40 minutes face-to-face with the patient discussing the above plan of care; more than 50% of time spent on prognosis/ natural history; counseling and coordination.   All questions were  answered. The patient knows to call the clinic with any problems, questions or concerns.    [name], MD 09/06/2023 10:23 AM

## 2023-09-06 NOTE — Progress Notes (Signed)
Nausea and vomiting last night and this morning. No fever. Has n/v meds.  Gained 6lb, stomach hard and swollen.  Appetite 50% normal, no supplement drinks.  SOB getting worse with activity.

## 2023-09-06 NOTE — Patient Instructions (Signed)
 [facility] - A DEPT OF [facility]  Discharge Instructions: Thank you for choosing [facility] to provide your oncology and hematology care.  If you have a lab appointment with the Cancer Center, please go directly to the Cancer Center and check in at the registration area.  Wear comfortable clothing and clothing appropriate for easy access to any Portacath or PICC line.   We strive to give you quality time with your provider. You may need to reschedule your appointment if you arrive late (15 or more minutes).  Arriving late affects you and other patients whose appointments are after yours.  Also, if you miss three or more appointments without notifying the office, you may be dismissed from the clinic at the provider's discretion.      For prescription refill requests, have your pharmacy contact our office and allow 72 hours for refills to be completed.    To help prevent nausea and vomiting after your treatment, we encourage you to take your nausea medication as directed.  BELOW ARE SYMPTOMS THAT SHOULD BE REPORTED IMMEDIATELY: *FEVER GREATER THAN 100.4 F (38 C) OR HIGHER *CHILLS OR SWEATING *NAUSEA AND VOMITING THAT IS NOT CONTROLLED WITH YOUR NAUSEA MEDICATION *UNUSUAL SHORTNESS OF BREATH *UNUSUAL BRUISING OR BLEEDING *URINARY PROBLEMS (pain or burning when urinating, or frequent urination) *BOWEL PROBLEMS (unusual diarrhea, constipation, pain near the anus) TENDERNESS IN MOUTH AND THROAT WITH OR WITHOUT PRESENCE OF ULCERS (sore throat, sores in mouth, or a toothache) UNUSUAL RASH, SWELLING OR PAIN  UNUSUAL VAGINAL DISCHARGE OR ITCHING   Items with * indicate a potential emergency and should be followed up as soon as possible or go to the Emergency Department if any problems should occur.  Please show the CHEMOTHERAPY ALERT CARD or IMMUNOTHERAPY ALERT CARD at check-in to the Emergency Department and triage nurse.  Should you have  questions after your visit or need to cancel or reschedule your appointment, please contact [facility] - A DEPT OF [facility]  [phone] and follow the prompts.  Office hours are 8:00 a.m. to 4:30 p.m. Monday - Friday. Please note that voicemails left after 4:00 p.m. may not be returned until the following business day.  We are closed weekends and major holidays. You have access to a nurse at all times for urgent questions. Please call the main number to the clinic [phone] and follow the prompts.  For any non-urgent questions, you may also contact your provider using MyChart. We now offer e-Visits for anyone 57 and older to request care online for non-urgent symptoms. For details visit [email or web address].   Also download the MyChart app! Go to the app store, search "MyChart", open the app, select [place], and log in with your MyChart username and password.

## 2023-09-07 ENCOUNTER — Encounter: Payer: Self-pay | Admitting: Internal Medicine

## 2023-09-09 ENCOUNTER — Other Ambulatory Visit: Payer: Self-pay | Admitting: Radiology

## 2023-09-09 ENCOUNTER — Ambulatory Visit
Admission: RE | Admit: 2023-09-09 | Discharge: 2023-09-09 | Disposition: A | Discharge status: Home / Self Care | Payer: Managed Care, Other (non HMO) | Source: Ambulatory Visit | Attending: Radiology | Admitting: Radiology

## 2023-09-09 ENCOUNTER — Ambulatory Visit
Admission: RE | Admit: 2023-09-09 | Discharge: 2023-09-09 | Disposition: A | Discharge status: Home / Self Care | Payer: Managed Care, Other (non HMO) | Source: Ambulatory Visit | Attending: Internal Medicine | Admitting: Internal Medicine

## 2023-09-09 DIAGNOSIS — C3432 Malignant neoplasm of lower lobe, left bronchus or lung: Secondary | ICD-10-CM

## 2023-09-09 DIAGNOSIS — Z9889 Other specified postprocedural states: Secondary | ICD-10-CM

## 2023-09-09 DIAGNOSIS — R180 Malignant ascites: Secondary | ICD-10-CM

## 2023-09-09 DIAGNOSIS — J90 Pleural effusion, not elsewhere classified: Secondary | ICD-10-CM | POA: Diagnosis present

## 2023-09-09 MED ORDER — LIDOCAINE HCL (PF) 1 % IJ SOLN
10.0000 mL | Freq: Once | INTRAMUSCULAR | Status: AC
  Administered 2023-09-09: 10 mL via INTRADERMAL
  Filled 2023-09-09: qty 10

## 2023-09-11 ENCOUNTER — Inpatient Hospital Stay (HOSPITAL_BASED_OUTPATIENT_CLINIC_OR_DEPARTMENT_OTHER): Payer: Managed Care, Other (non HMO) | Admitting: Hospice and Palliative Medicine

## 2023-09-11 ENCOUNTER — Other Ambulatory Visit: Payer: Self-pay | Admitting: *Deleted

## 2023-09-11 ENCOUNTER — Inpatient Hospital Stay: Payer: Managed Care, Other (non HMO)

## 2023-09-11 ENCOUNTER — Encounter: Payer: Self-pay | Admitting: Internal Medicine

## 2023-09-11 VITALS — BP 143/105 | HR 108 | Temp 96.3°F | Resp 20

## 2023-09-11 DIAGNOSIS — R112 Nausea with vomiting, unspecified: Secondary | ICD-10-CM

## 2023-09-11 DIAGNOSIS — C3432 Malignant neoplasm of lower lobe, left bronchus or lung: Secondary | ICD-10-CM

## 2023-09-11 DIAGNOSIS — C7931 Secondary malignant neoplasm of brain: Secondary | ICD-10-CM

## 2023-09-11 DIAGNOSIS — Z5112 Encounter for antineoplastic immunotherapy: Secondary | ICD-10-CM | POA: Diagnosis not present

## 2023-09-11 DIAGNOSIS — R180 Malignant ascites: Secondary | ICD-10-CM

## 2023-09-11 LAB — CBC WITH DIFFERENTIAL (CANCER CENTER ONLY)
Abs Immature Granulocytes: 0.04 10*3/uL (ref 0.00–0.07)
Basophils Absolute: 0.1 10*3/uL (ref 0.0–0.1)
Basophils Relative: 2 %
Eosinophils Absolute: 0.1 10*3/uL (ref 0.0–0.5)
Eosinophils Relative: 2 %
HCT: 38.1 % — ABNORMAL LOW (ref 39.0–52.0)
Hemoglobin: 13.1 g/dL (ref 13.0–17.0)
Immature Granulocytes: 1 %
Lymphocytes Relative: 8 %
Lymphs Abs: 0.5 10*3/uL — ABNORMAL LOW (ref 0.7–4.0)
MCH: 32 pg (ref 26.0–34.0)
MCHC: 34.4 g/dL (ref 30.0–36.0)
MCV: 93.2 fL (ref 80.0–100.0)
Monocytes Absolute: 0.1 10*3/uL (ref 0.1–1.0)
Monocytes Relative: 2 %
Neutro Abs: 5.7 10*3/uL (ref 1.7–7.7)
Neutrophils Relative %: 85 %
Platelet Count: 112 10*3/uL — ABNORMAL LOW (ref 150–400)
RBC: 4.09 MIL/uL — ABNORMAL LOW (ref 4.22–5.81)
RDW: 14.7 % (ref 11.5–15.5)
Smear Review: NORMAL
WBC Count: 6.6 10*3/uL (ref 4.0–10.5)
nRBC: 0 % (ref 0.0–0.2)

## 2023-09-11 LAB — CMP (CANCER CENTER ONLY)
ALT: 79 U/L — ABNORMAL HIGH (ref 0–44)
AST: 80 U/L — ABNORMAL HIGH (ref 15–41)
Albumin: 2 g/dL — ABNORMAL LOW (ref 3.5–5.0)
Alkaline Phosphatase: 264 U/L — ABNORMAL HIGH (ref 38–126)
Anion gap: 8 (ref 5–15)
BUN: 13 mg/dL (ref 6–20)
CO2: 25 mmol/L (ref 22–32)
Calcium: 7.7 mg/dL — ABNORMAL LOW (ref 8.9–10.3)
Chloride: 96 mmol/L — ABNORMAL LOW (ref 98–111)
Creatinine: 0.56 mg/dL — ABNORMAL LOW (ref 0.61–1.24)
GFR, Estimated: >60 mL/min (ref >60)
Glucose, Bld: 122 mg/dL — ABNORMAL HIGH (ref 70–99)
Potassium: 3.6 mmol/L (ref 3.5–5.1)
Sodium: 129 mmol/L — ABNORMAL LOW (ref 135–145)
Total Bilirubin: 2.8 mg/dL — ABNORMAL HIGH (ref <1.2)
Total Protein: 5 g/dL — ABNORMAL LOW (ref 6.5–8.1)

## 2023-09-11 MED ORDER — HEPARIN SOD (PORK) LOCK FLUSH 100 UNIT/ML IV SOLN
500.0000 [IU] | Freq: Once | INTRAVENOUS | Status: AC
  Administered 2023-09-11: 500 [IU] via INTRAVENOUS
  Filled 2023-09-11: qty 5

## 2023-09-11 MED ORDER — SODIUM CHLORIDE 0.9% FLUSH
10.0000 mL | Freq: Once | INTRAVENOUS | Status: AC
  Administered 2023-09-11: 10 mL via INTRAVENOUS
  Filled 2023-09-11: qty 10

## 2023-09-11 MED ORDER — PROCHLORPERAZINE EDISYLATE 10 MG/2ML IJ SOLN
10.0000 mg | Freq: Once | INTRAMUSCULAR | Status: AC
  Administered 2023-09-11: 10 mg via INTRAVENOUS
  Filled 2023-09-11: qty 2

## 2023-09-11 MED ORDER — DEXAMETHASONE SODIUM PHOSPHATE 10 MG/ML IJ SOLN
10.0000 mg | Freq: Once | INTRAMUSCULAR | Status: AC
Start: 2023-09-11 — End: 2023-09-11
  Administered 2023-09-11: 10 mg via INTRAVENOUS
  Filled 2023-09-11: qty 1

## 2023-09-11 MED ORDER — DEXAMETHASONE SODIUM PHOSPHATE 100 MG/10ML IJ SOLN: 10.0000 mg | Freq: Once | INTRAMUSCULAR | Status: DC

## 2023-09-11 MED ORDER — ONDANSETRON HCL 4 MG/2ML IJ SOLN
8.0000 mg | Freq: Once | INTRAMUSCULAR | Status: AC
  Administered 2023-09-11: 8 mg via INTRAVENOUS
  Filled 2023-09-11: qty 4

## 2023-09-11 MED ORDER — FAMOTIDINE IN NACL 20-0.9 MG/50ML-% IV SOLN
20.0000 mg | Freq: Once | INTRAVENOUS | Status: AC
  Administered 2023-09-11: 20 mg via INTRAVENOUS
  Filled 2023-09-11: qty 50

## 2023-09-11 MED ORDER — SODIUM CHLORIDE 0.9 % IV SOLN
INTRAVENOUS | Status: DC
Start: 2023-09-11 — End: 2023-09-11
  Filled 2023-09-11 (×2): qty 250

## 2023-09-11 NOTE — Progress Notes (Signed)
Symptom Management [facility] at [facility] [phone] [phone]  Patient Care Team: Pcp, No as PCP - General [name], RN as Oncology Nurse Navigator [name], MD as Consulting Physician (Internal Medicine)   NAME OF PATIENT: [name]  [id number]  [birth year]   DATE OF VISIT: 09/11/23  REASON FOR CONSULT: [name] is a 42 y.o. 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   INTERVAL HISTORY: Patient saw Dr. Donneta Romberg on 09/06/2023.  He received cycle 2 carbo Alimta-Ami chemotherapy.  Patient presents [facility] today with intermittent nausea and vomiting.  Overall, he feels poorly.  Denies fever or chills.  Has poor appetite.  Occasional shortness of breath with exertion but no chest pain.  Has been treated recently with Kenalog for a rash to his face.  Denies recent fevers or illnesses. Denies chest pain. Denies urinary complaints.  Having regular bowel movements.  Patient offers no further specific complaints today.  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   PORTA CATH INSERTION N/A 03/28/2021   Procedure: PORTA CATH INSERTION;  Surgeon: Renford Dills, MD;  Location: [facility] INVASIVE CV LAB;  Service: Cardiovascular;  Laterality: N/A;   VIDEO BRONCHOSCOPY WITH ENDOBRONCHIAL NAVIGATION N/A 03/15/2021   Procedure: VIDEO BRONCHOSCOPY WITH ENDOBRONCHIAL NAVIGATION;  Surgeon: Vida Rigger, MD;  Location: [facility] ORS;  Service: Thoracic;  Laterality: N/A;   VIDEO BRONCHOSCOPY WITH ENDOBRONCHIAL ULTRASOUND N/A 03/15/2021   Procedure: VIDEO BRONCHOSCOPY WITH ENDOBRONCHIAL  ULTRASOUND;  Surgeon: Vida Rigger, MD;  Location: [facility] ORS;  Service: Thoracic;  Laterality: N/A;    HEMATOLOGY/ONCOLOGY HISTORY:  Oncology History Overview Note  IMPRESSION: 1. Patchy nodular fat stranding throughout the anterior left upper quadrant peritoneal fat, nonspecific, cannot exclude peritoneal carcinomatosis. Dedicated CT abdomen/pelvis with oral and IV contrast recommended for further evaluation. 2. Dense patchy consolidation replacing much of the left lower lung lobe, appearing masslike in the superior segment left lower lobe, with associated bulging of the left major fissure and associated left lower lobe volume loss. Fine nodularity throughout both lungs with an upper lobe predominance. Asymmetric left upper lobe interlobular septal thickening. These findings are indeterminate, with differential including multilobar pneumonia, sarcoidosis or a neoplastic process. The persistence on radiographs back to 01/20/2021 despite antibiotic therapy make sarcoidosis or a neoplastic process more likely. Pulmonology consultation suggested for consideration of bronchoscopic evaluation. 3. Small dependent left pleural effusion. 4. Mild mediastinal lymphadenopathy, nonspecific. 5. Subacute healing lateral right sixth rib fracture.   DIAGNOSIS:  A. LUNG, LEFT LOWER LOBE; ENB-ASSISTED BIOPSY:  - NON-SMALL CELL CARCINOMA, FAVOR ADENOCARCINOMA.  - FOREIGN MATERIAL SUGGESTIVE OF ASPIRATION.   # EGFR MUTATED: 73 del- June 28th, 2022- [name]. [limited]  # Asymptomatic multiple brain mets subcentimeter asymptomatic -JUNE 29th, 2023- Numerous metastatic lesions in the supratentorial brain-increase in number also conspicuity compared to January 2023.  Pre-SBRT MRI brain AUG 18th, 2023-  Eighteen small enhancing brain metastases (punctate to 11 mm); were all present on 03/28/2021-s/p SBRT SEP 2023- [[place]]; SRS/ radiation treatment for brain metastasis He finished treatment on 11-26-22.    # SEP-OCT-worsening left-sided  pleural effusion-status post paracentesis; exudative; Cytology negative- ?  Progressive disease.  # NOV 2023- JAN 2024 CARBO-TAXOL-BEV+ATEZO x4 cyles- BEV+ATEZO- until APRIL 2024  # STAGE IV- [JUNE 2022] Left lower lobe lung cancer-adenocarcinoma the lung;  EGFR- 19 del POSITIVE.  While on osimertinib- NOV 1st, 2023- PET scan- shows progressive disease in liver. Liquid Biopsy/Guardant testing-positive for EGFR mutation; otherwise Negative for any novel mutations.  Repeat liver Biopsy positive for adenocarcinoma. NEGATIVE for MET amplification.  S/p second opinion at [facility]- Dr.Stinchcomb. status post 4 cycles of carbo Taxol-Atezo+Bev- JAN 25th, 2024-PET- Interval response to therapy with resolution of previously demonstrated extensive hypermetabolic activity throughout the liver. PARIL 2024- ? Partial response, but clinically worse/rising LFTs  # MAY 6th, Brain MRI-progressive multiple brain metastases  # MAY 16th, 2024- Alimta+Avastin #1    Cancer of lower lobe of left lung (HCC)  03/23/2021 Initial Diagnosis   Cancer of lower lobe of left lung (HCC)   03/29/2021 Cancer Staging   Staging form: Lung, AJCC 8th Edition - Clinical: Stage IVB (cT3, cN3, pM1c) - Signed by [name], MD on 03/29/2021   03/30/2021 - 03/30/2021 Chemotherapy   Patient is on Treatment Plan : LUNG NSCLC Pemetrexed (Alimta) / Carboplatin q21d x 1 cycles     08/17/2022 - 01/15/2023 Chemotherapy   Patient is on Treatment Plan : LUNG Atezolizumab + Bevacizumab + Carboplatin + Paclitaxel q21d Induction x 4 cycles / Atezolizumab + Bevacizumab q21d Maintenance     02/28/2023 - 07/02/2023 Chemotherapy   Patient is on Treatment Plan : LUNG NON-SMALL CELL Bevacizumab + Pemetrexed q21d     08/07/2023 -  Chemotherapy   Patient is on Treatment Plan : LUNG NSCLC Pemetrexed + Carboplatin + Amivantamab q21d x 4 Cycles / Pemetrexed + Amivantamab q21d       ALLERGIES:  is allergic to  codeine.  MEDICATIONS:  Current Outpatient Medications  Medication Sig Dispense Refill   albuterol (VENTOLIN HFA) 108 (90 Base) MCG/ACT inhaler Inhale 2 puffs into the lungs every 6 (six) hours as needed for wheezing or shortness of breath. 8 g 2   calcium carbonate (TUMS EX) 750 MG chewable tablet Chew 1 tablet by mouth daily.     ELIQUIS 2.5 MG TABS tablet TAKE 1 TABLET BY MOUTH TWICE A DAY 60 tablet 4   folic acid (FOLVITE) 1 MG tablet TAKE 1 TABLET BY MOUTH EVERY DAY 90 tablet 1   lidocaine-prilocaine (EMLA) cream Apply 1 Application topically as needed. 30 g 0   methylphenidate (RITALIN) 5 MG tablet Take 1 tablet (5 mg total) by mouth 2 (two) times daily as needed (fatigue). 60 tablet 0   metoprolol succinate (TOPROL-XL) 50 MG 24 hr tablet Take 50 mg by mouth daily. Take with or immediately following a meal.     montelukast (SINGULAIR) 10 MG tablet TAKE 1 TABLET BY MOUTH EVERYDAY AT BEDTIME 90 tablet 1   OLANZapine (ZYPREXA) 5 MG tablet Take 1 tablet (5 mg total) by mouth at bedtime as needed (nausea). 30 tablet 0   ondansetron (ZOFRAN-ODT) 4 MG disintegrating tablet Take 1 tablet (4 mg total) by mouth every 8 (eight) hours as needed for nausea or vomiting. 20 tablet 1   sertraline (ZOLOFT) 50 MG tablet Take 1 tablet (50 mg total) by mouth daily. 30 tablet 1   traMADol (ULTRAM) 50 MG tablet Take 1 tablet (50 mg total) by mouth every 12 (twelve) hours as needed. 30 tablet 0   triamcinolone ointment (KENALOG) 0.5 % Apply 1 Application  topically 2 (two) times daily. 30 g 1   Vitamin D, Ergocalciferol, (DRISDOL) 1.25 MG (50000 UNIT) CAPS capsule TAKE 1 CAPSULE BY MOUTH ONE TIME PER WEEK 12 capsule 1   No current facility-administered medications for this visit.   Facility-Administered Medications Ordered in Other Visits  Medication Dose Route Frequency Provider Last Rate Last Admin   0.9 %  sodium chloride infusion   Intravenous Continuous Bani Gianfrancesco, Daryl Eastern, NP   Stopped at 09/11/23 1126    heparin lock flush 100 UNIT/ML injection            heparin lock flush 100 UNIT/ML injection            heparin lock flush 100 unit/mL  500 Units Intravenous Once Terin Cragle, Ivin Booty R, NP       sodium chloride flush (NS) 0.9 % injection 10 mL  10 mL Intravenous PRN Louretta Shorten R, MD   10 mL at 07/28/21 0852   sodium chloride flush (NS) 0.9 % injection 10 mL  10 mL Intravenous Once Namari Breton, Daryl Eastern, NP        VITAL SIGNS: There were no vitals taken for this visit. There were no vitals filed for this visit.   Estimated body mass index is 30.04 kg/m as calculated from the following:   Height as of 09/06/23: 5\' 11"  (1.803 m).   Weight as of 09/06/23: 215 lb 6.4 oz (97.7 kg).  LABS: CBC:    Component Value Date/Time   WBC 6.6 09/11/2023 0940   WBC 6.7 05/27/2023 0943   HGB 13.1 09/11/2023 0940   HCT 38.1 (L) 09/11/2023 0940   PLT 112 (L) 09/11/2023 0940   MCV 93.2 09/11/2023 0940   NEUTROABS 5.7 09/11/2023 0940   LYMPHSABS 0.5 (L) 09/11/2023 0940   MONOABS 0.1 09/11/2023 0940   EOSABS 0.1 09/11/2023 0940   BASOSABS 0.1 09/11/2023 0940   Comprehensive Metabolic Panel:    Component Value Date/Time   NA 129 (L) 09/11/2023 0940   K 3.6 09/11/2023 0940   CL 96 (L) 09/11/2023 0940   CO2 25 09/11/2023 0940   BUN 13 09/11/2023 0940   CREATININE 0.56 (L) 09/11/2023 0940   GLUCOSE 122 (H) 09/11/2023 0940   CALCIUM 7.7 (L) 09/11/2023 0940   AST 80 (H) 09/11/2023 0940   ALT 79 (H) 09/11/2023 0940   ALKPHOS 264 (H) 09/11/2023 0940   BILITOT 2.8 (H) 09/11/2023 0940   PROT 5.0 (L) 09/11/2023 0940   ALBUMIN 2.0 (L) 09/11/2023 0940    RADIOGRAPHIC STUDIES: DG Chest Port 1 View  Result Date: 09/09/2023 CLINICAL DATA:  Left lower lobe lung cancer. Status post left thoracentesis. EXAM: PORTABLE CHEST 1 VIEW COMPARISON:  09/06/2023 FINDINGS: Power injectable Port-A-Cath tip: Cavoatrial junction. Minimal increase in aeration in the left lung, large left pleural effusion observed. No  pneumothorax or radiographic evidence of procedure complication. The right lung remains clear. Heart size within normal limits although the left apical border remains obscured. IMPRESSION: 1. Minimal increase in aeration in the left lung, large left pleural effusion observed. No pneumothorax or radiographic evidence of procedure complication. Electronically Signed   By: Gaylyn Rong M.D.   On: 09/09/2023 14:38   US THORACENTESIS ASP PLEURAL SPACE W/IMG GUIDE  Result Date: 09/09/2023 INDICATION: Patient with history of left lung cancer with recurrent left-sided pleural effusion. Request is for therapeutic thoracentesis EXAM: ULTRASOUND GUIDED THERAPEUTIC LEFT-SIDED THORACENTESIS MEDICATIONS: Lidocaine 1% 10 mL COMPLICATIONS: None immediate. PROCEDURE: An ultrasound guided thoracentesis was thoroughly discussed with the  patient and questions answered. The benefits, risks, alternatives and complications were also discussed. The patient understands and wishes to proceed with the procedure. Written consent was obtained. Ultrasound was performed to localize and mark an adequate pocket of fluid in the left chest. The area was then prepped and draped in the normal sterile fashion. 1% Lidocaine was used for local anesthesia. Under ultrasound guidance a 6 Fr Safe-T-Centesis catheter was introduced. Thoracentesis was performed. The catheter was removed and a dressing applied. FINDINGS: A total of approximately 450 mL of reddish brown fluid was removed. Samples were sent to the laboratory as requested by the clinical team. IMPRESSION: Successful ultrasound guided therapeutic left-sided thoracentesis yielding 450 mL of pleural fluid. Performed by Anders Grant NP Electronically Signed   By: Judie Petit.  Shick M.D.   On: 09/09/2023 13:58   DG Chest 2 View  Result Date: 09/06/2023 CLINICAL DATA:  Cough and shortness of breath. History of lung cancer. EXAM: CHEST - 2 VIEW COMPARISON:  Chest x-ray dated August 13, 2023.  FINDINGS: Unchanged right chest wall port catheter. Stable cardiomediastinal silhouette with normal heart size. Unchanged chronic loculated large left pleural effusion with atelectasis in the lingula and left lower lobe. Slightly increased interstitial thickening in the right lung. No pneumothorax. No acute osseous abnormality. IMPRESSION: 1. Slightly increased interstitial thickening in the right lung, which may reflect atypical infection or pulmonary edema. 2. Unchanged chronic loculated large left pleural effusion. Electronically Signed   By: Obie Dredge M.D.   On: 09/06/2023 17:11   US Paracentesis  Result Date: 09/06/2023 INDICATION: malignant ascites EXAM: ULTRASOUND GUIDED  PARACENTESIS MEDICATIONS: None. COMPLICATIONS: None immediate. PROCEDURE: Informed written consent was obtained from the patient after a discussion of the risks, benefits and alternatives to treatment. A timeout was performed prior to the initiation of the procedure. Initial ultrasound scanning demonstrates a large amount of ascites within the right lower abdominal quadrant. The right lower abdomen was prepped and draped in the usual sterile fashion. 1% lidocaine was used for local anesthesia. Following this, a 19 gauge, 7-cm, Yueh catheter was introduced. An ultrasound image was saved for documentation purposes. The paracentesis was performed. The catheter was removed and a dressing was applied. The patient tolerated the procedure well without immediate post procedural complication. FINDINGS: A total of approximately 4.8 L of clear, straw-colored fluid was removed. IMPRESSION: Successful ultrasound-guided paracentesis yielding 4.8 liters of peritoneal fluid. Electronically Signed   By: Olive Bass M.D.   On: 09/06/2023 16:39   US Paracentesis  Result Date: 08/23/2023 INDICATION: Malignant ascites. Request received for diagnostic and therapeutic paracentesis EXAM: ULTRASOUND GUIDED  PARACENTESIS MEDICATIONS: 10 cc 1%  lidocaine COMPLICATIONS: None immediate. PROCEDURE: Informed written consent was obtained from the patient after a discussion of the risks, benefits and alternatives to treatment. A timeout was performed prior to the initiation of the procedure. Initial ultrasound scanning demonstrates a large amount of ascites within the right lower abdominal quadrant. The right lower abdomen was prepped and draped in the usual sterile fashion. 1% lidocaine was used for local anesthesia. Following this, a 19 gauge, 10-cm, Yueh catheter was introduced. An ultrasound image was saved for documentation purposes. The paracentesis was performed. The catheter was removed and a dressing was applied. The patient tolerated the procedure well without immediate post procedural complication. FINDINGS: A total of approximately 4.1 L of amber fluid was removed. Samples were sent to the laboratory as requested by the clinical team. IMPRESSION: Successful ultrasound-guided paracentesis yielding 4.1 liters of peritoneal fluid.  Procedure performed by Mina Marble, PA-C Electronically Signed   By: Olive Bass M.D.   On: 08/23/2023 16:43   US Paracentesis  Result Date: 08/14/2023 INDICATION: Patient with metastatic lung cancer presents today with ascites. Therapeutic paracentesis requested. EXAM: ULTRASOUND GUIDED PARACENTESIS MEDICATIONS: 1% lidocaine 10 mL COMPLICATIONS: None immediate. PROCEDURE: Informed written consent was obtained from the patient after a discussion of the risks, benefits and alternatives to treatment. A timeout was performed prior to the initiation of the procedure. Initial ultrasound scanning demonstrates a large amount of ascites within the right lower abdominal quadrant. The right lower abdomen was prepped and draped in the usual sterile fashion. 1% lidocaine was used for local anesthesia. Following this, a 19 gauge, 7-cm, Yueh catheter was introduced. An ultrasound image was saved for documentation purposes. The  paracentesis was performed. The catheter was removed and a dressing was applied. The patient tolerated the procedure well without immediate post procedural complication. FINDINGS: A total of approximately 5.2 L of amber colored fluid was removed. IMPRESSION: Successful ultrasound-guided paracentesis yielding 5.2 liters of peritoneal fluid. Procedure performed by Alwyn Ren NP Electronically Signed   By: Olive Bass M.D.   On: 08/14/2023 15:26   CT ABDOMEN PELVIS W CONTRAST  Result Date: 08/13/2023 CLINICAL DATA:  Left-sided abdominal pain metastatic disease distension EXAM: CT ABDOMEN AND PELVIS WITH CONTRAST TECHNIQUE: Multidetector CT imaging of the abdomen and pelvis was performed using the standard protocol following bolus administration of intravenous contrast. RADIATION DOSE REDUCTION: This exam was performed according to the departmental dose-optimization program which includes automated exposure control, adjustment of the mA and/or kV according to patient size and/or use of iterative reconstruction technique. CONTRAST:  OMNIPAQUE IOHEXOL 300 MG/ML  SOLN COMPARISON:  PET CT 07/16/2023, CT abdomen pelvis 05/27/2023, CT 06/27/2021, MRI 09/04/2022 FINDINGS: Lower chest: Lung bases demonstrate incompletely visualized chronic large loculated left pleural effusion. Partial left lower lobe consolidation. Hepatobiliary: Atrophic hypodense appearing left hepatic lobe and irregular heterogenous hypodensity in the posterior right hepatic lobe, corresponding to the patient's history of metastatic disease. Liver appears grossly nodular consistent with cirrhotic morphology. No calcified gallstone or biliary dilatation. Findings appear slightly progressive compared with CT from August. Pancreas: Unremarkable. No pancreatic ductal dilatation or surrounding inflammatory changes. Spleen: Upper normal in size measuring 13 cm oblique craniocaudad. Adrenals/Urinary Tract: Adrenal glands are normal. Kidneys show  no hydronephrosis. The bladder is unremarkable Stomach/Bowel: The stomach is nonenlarged. No dilated small bowel. No acute bowel wall thickening. Vascular/Lymphatic: Nonaneurysmal aorta. No suspicious lymph nodes. 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 Main and right portal veins appear patent. Reproductive: Prostate is unremarkable. Other: No free air.  Interval large volume abdominopelvic ascites. Musculoskeletal: Scattered subtle foci of lytic and sclerotic change within the spine and pelvis, corresponding to history of treated skeletal metastatic disease. IMPRESSION: 1. Interval large volume abdominopelvic ascites. 2. Cirrhotic morphology of the liver with atrophic left hepatic lobe and irregular heterogenous hypodensity in the posterior right hepatic lobe, corresponding to the patient's known history of metastatic disease. Findings appear slightly progressive compared with CT from August. 3. 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 4. Incompletely visualized chronic large loculated left pleural effusion with partial left lower lobe consolidation. 5. No acute osseous abnormality. No new suspicious bone lesion by CT. Electronically Signed   By: Jasmine Pang M.D.   On: 08/13/2023 20:50   DG Chest 2 View  Result Date: 08/13/2023 CLINICAL DATA:  History of pleural effusion in lung cancer EXAM: CHEST -  2 VIEW COMPARISON:  05/28/2023 FINDINGS: Right chest port with the catheter tip at superior cavoatrial junction. Unchanged cardiac and mediastinal contours. Large left pleural effusion, overall unchanged from prior exam. No new focal pulmonary opacity in the right lung. No right pleural effusion. No pneumothorax bilaterally. No acute osseous abnormality. IMPRESSION: Large left pleural effusion, overall unchanged from prior exam. Electronically Signed   By: Wiliam Ke M.D.   On: 08/13/2023 11:40    PERFORMANCE STATUS (ECOG) : 1 - Symptomatic but completely ambulatory  Review of Systems Unless  otherwise noted, a complete review of systems is negative.  Physical Exam General: NAD Cardiovascular: regular rate and rhythm Pulmonary: clear ant fields Abdomen: distended, tender L side, hypoactive bowel sounds GU: no suprapubic tenderness Extremities: no edema, no joint deformities Skin: no rashes Neurological: Weakness but otherwise nonfocal  IMPRESSION/PLAN: Stage IV non-small cell lung cancer with brain/bone metastasis -PET scan 07/16/23 showed progressive disease.  Patient status post cycle 2 Carboplatin-Alimta-Amivatimab.  Patient pending evaluation at [facility] next week.  Nausea/vomiting -likely multifactorial with treatment/cancer.  No overt signs of obstructive process.  Labs grossly unchanged from baseline.  Patient did have paracentesis on 11/8 with 4.1 L removed.  Patient had significant postprocedural improvement in symptoms.  He is interested in repeating paracentesis but not this week.  He would like to get it done next week before he leaves for [place].  Patient received supportive care today with IV fluids/steroids/antiemetics.  Discussed importance of maintaining consistent antiemetic regimen ED triggers reviewed in detail.   Patient has follow-up scheduled next week.  Patient expressed understanding and was in agreement with this plan. He also understands that He can call clinic at any time with any questions, concerns, or complaints.   Thank you for allowing me to participate in the care of this very pleasant patient.   Time Total: 25 minutes  Visit consisted of counseling and education dealing with the complex and emotionally intense issues of symptom management in the setting of serious illness.Greater than 50%  of this time was spent counseling and coordinating care related to the above assessment and plan.  Signed by: Laurette Schimke, PhD, NP-C

## 2023-09-11 NOTE — Telephone Encounter (Signed)
At this time, fluid clinic and port lab apts per josh. Pt called and accepted apt. He will be here w/in 30 mins.

## 2023-09-11 NOTE — Progress Notes (Signed)
Pt in clinic today for IVF/nausea meds. Patient given 1 Liter NS over one hour and 8 mg Zofran. Ate ice chips and pack of crackers, seemed to have tolerated well. Port de accessed, pt getting ready to leave clinic and vomited large amount. [name] Borders NP notified, additional orders obtained. Port re accessed- 10 mg IV decadron, 10 mg IV compazine, and 20 mg of Pepcid given. Will continue to monitor

## 2023-09-14 ENCOUNTER — Other Ambulatory Visit: Payer: Self-pay

## 2023-09-14 ENCOUNTER — Observation Stay
Admission: EM | Admit: 2023-09-14 | Discharge: 2023-09-15 | Disposition: A | Discharge status: Home / Self Care | Payer: Managed Care, Other (non HMO) | Attending: Internal Medicine | Admitting: Internal Medicine

## 2023-09-14 ENCOUNTER — Telehealth: Payer: Self-pay | Admitting: Oncology

## 2023-09-14 ENCOUNTER — Other Ambulatory Visit: Payer: Self-pay | Admitting: Oncology

## 2023-09-14 ENCOUNTER — Encounter: Payer: Self-pay | Admitting: Intensive Care

## 2023-09-14 DIAGNOSIS — E871 Hypo-osmolality and hyponatremia: Secondary | ICD-10-CM | POA: Diagnosis present

## 2023-09-14 DIAGNOSIS — Z09 Encounter for follow-up examination after completed treatment for conditions other than malignant neoplasm: Secondary | ICD-10-CM | POA: Diagnosis not present

## 2023-09-14 DIAGNOSIS — Z85118 Personal history of other malignant neoplasm of bronchus and lung: Secondary | ICD-10-CM | POA: Diagnosis not present

## 2023-09-14 DIAGNOSIS — F32A Depression, unspecified: Secondary | ICD-10-CM | POA: Insufficient documentation

## 2023-09-14 DIAGNOSIS — R1110 Vomiting, unspecified: Principal | ICD-10-CM

## 2023-09-14 DIAGNOSIS — Z7901 Long term (current) use of anticoagulants: Secondary | ICD-10-CM | POA: Diagnosis not present

## 2023-09-14 DIAGNOSIS — Z79899 Other long term (current) drug therapy: Secondary | ICD-10-CM | POA: Insufficient documentation

## 2023-09-14 DIAGNOSIS — Z87891 Personal history of nicotine dependence: Secondary | ICD-10-CM | POA: Insufficient documentation

## 2023-09-14 DIAGNOSIS — C7951 Secondary malignant neoplasm of bone: Secondary | ICD-10-CM | POA: Insufficient documentation

## 2023-09-14 DIAGNOSIS — I10 Essential (primary) hypertension: Secondary | ICD-10-CM | POA: Diagnosis not present

## 2023-09-14 DIAGNOSIS — C7931 Secondary malignant neoplasm of brain: Secondary | ICD-10-CM | POA: Diagnosis not present

## 2023-09-14 DIAGNOSIS — R112 Nausea with vomiting, unspecified: Secondary | ICD-10-CM | POA: Diagnosis present

## 2023-09-14 DIAGNOSIS — C3432 Malignant neoplasm of lower lobe, left bronchus or lung: Secondary | ICD-10-CM | POA: Diagnosis present

## 2023-09-14 LAB — COMPREHENSIVE METABOLIC PANEL
ALT: 73 U/L — ABNORMAL HIGH (ref 0–44)
AST: 67 U/L — ABNORMAL HIGH (ref 15–41)
Albumin: 1.7 g/dL — ABNORMAL LOW (ref 3.5–5.0)
Alkaline Phosphatase: 213 U/L — ABNORMAL HIGH (ref 38–126)
Anion gap: 6 (ref 5–15)
BUN: 14 mg/dL (ref 6–20)
CO2: 25 mmol/L (ref 22–32)
Calcium: 7.2 mg/dL — ABNORMAL LOW (ref 8.9–10.3)
Chloride: 94 mmol/L — ABNORMAL LOW (ref 98–111)
Creatinine, Ser: 0.63 mg/dL (ref 0.61–1.24)
GFR, Estimated: >60 mL/min (ref >60)
Glucose, Bld: 116 mg/dL — ABNORMAL HIGH (ref 70–99)
Potassium: 3.9 mmol/L (ref 3.5–5.1)
Sodium: 125 mmol/L — ABNORMAL LOW (ref 135–145)
Total Bilirubin: 2.3 mg/dL — ABNORMAL HIGH (ref <1.2)
Total Protein: 4.6 g/dL — ABNORMAL LOW (ref 6.5–8.1)

## 2023-09-14 LAB — CBC WITH DIFFERENTIAL/PLATELET
Abs Immature Granulocytes: 0.04 10*3/uL (ref 0.00–0.07)
Basophils Absolute: 0.1 10*3/uL (ref 0.0–0.1)
Basophils Relative: 1 %
Eosinophils Absolute: 0.1 10*3/uL (ref 0.0–0.5)
Eosinophils Relative: 1 %
HCT: 30.5 % — ABNORMAL LOW (ref 39.0–52.0)
Hemoglobin: 10.8 g/dL — ABNORMAL LOW (ref 13.0–17.0)
Immature Granulocytes: 1 %
Lymphocytes Relative: 18 %
Lymphs Abs: 0.8 10*3/uL (ref 0.7–4.0)
MCH: 32.2 pg (ref 26.0–34.0)
MCHC: 35.4 g/dL (ref 30.0–36.0)
MCV: 91 fL (ref 80.0–100.0)
Monocytes Absolute: 0.4 10*3/uL (ref 0.1–1.0)
Monocytes Relative: 8 %
Neutro Abs: 3.1 10*3/uL (ref 1.7–7.7)
Neutrophils Relative %: 71 %
Platelets: 45 10*3/uL — ABNORMAL LOW (ref 150–400)
RBC: 3.35 MIL/uL — ABNORMAL LOW (ref 4.22–5.81)
RDW: 14.7 % (ref 11.5–15.5)
WBC: 4.4 10*3/uL (ref 4.0–10.5)
nRBC: 0 % (ref 0.0–0.2)

## 2023-09-14 LAB — TROPONIN I (HIGH SENSITIVITY)
Troponin I (High Sensitivity): 19 ng/L — ABNORMAL HIGH (ref <18)
Troponin I (High Sensitivity): 22 ng/L — ABNORMAL HIGH (ref <18)

## 2023-09-14 LAB — LIPASE, BLOOD: Lipase: 46 U/L (ref 11–51)

## 2023-09-14 MED ORDER — POTASSIUM CHLORIDE IN NACL 20-0.9 MEQ/L-% IV SOLN
INTRAVENOUS | Status: DC
  Filled 2023-09-14: qty 1000

## 2023-09-14 MED ORDER — DROPERIDOL 2.5 MG/ML IJ SOLN
2.5000 mg | Freq: Once | INTRAMUSCULAR | Status: AC
  Administered 2023-09-14: 2.5 mg via INTRAVENOUS
  Filled 2023-09-14: qty 2

## 2023-09-14 MED ORDER — TRAZODONE HCL 50 MG PO TABS: 25.0000 mg | ORAL_TABLET | Freq: Every evening | ORAL | Status: DC | PRN

## 2023-09-14 MED ORDER — PROMETHAZINE HCL 25 MG PO TABS: 25.0000 mg | ORAL_TABLET | Freq: Four times a day (QID) | ORAL | 0 refills | Status: DC | PRN

## 2023-09-14 MED ORDER — ACETAMINOPHEN 650 MG RE SUPP: 650.0000 mg | Freq: Four times a day (QID) | RECTAL | Status: DC | PRN

## 2023-09-14 MED ORDER — ONDANSETRON HCL 4 MG/2ML IJ SOLN: 4.0000 mg | Freq: Four times a day (QID) | INTRAMUSCULAR | Status: DC | PRN

## 2023-09-14 MED ORDER — ONDANSETRON 4 MG PO TBDP: 4.0000 mg | ORAL_TABLET | Freq: Three times a day (TID) | ORAL | Status: DC | PRN

## 2023-09-14 MED ORDER — METHYLPHENIDATE HCL 5 MG PO TABS: 5.0000 mg | ORAL_TABLET | Freq: Two times a day (BID) | ORAL | Status: DC | PRN

## 2023-09-14 MED ORDER — ACETAMINOPHEN 325 MG PO TABS: 650.0000 mg | ORAL_TABLET | Freq: Four times a day (QID) | ORAL | Status: DC | PRN

## 2023-09-14 MED ORDER — MAGNESIUM HYDROXIDE 400 MG/5ML PO SUSP: 30.0000 mL | Freq: Every day | ORAL | Status: DC | PRN

## 2023-09-14 MED ORDER — SERTRALINE HCL 50 MG PO TABS: 50.0000 mg | ORAL_TABLET | Freq: Every day | ORAL | Status: DC

## 2023-09-14 MED ORDER — CALCIUM CARBONATE ANTACID 500 MG PO CHEW
1.0000 | CHEWABLE_TABLET | Freq: Every day | ORAL | Status: DC
  Administered 2023-09-14: 200 mg via ORAL
  Filled 2023-09-14: qty 1

## 2023-09-14 MED ORDER — SODIUM CHLORIDE 0.9 % IV SOLN: Freq: Once | INTRAVENOUS | Status: DC

## 2023-09-14 MED ORDER — METOPROLOL SUCCINATE ER 50 MG PO TB24
50.0000 mg | ORAL_TABLET | Freq: Every day | ORAL | Status: DC
  Administered 2023-09-14: 50 mg via ORAL
  Filled 2023-09-14: qty 1

## 2023-09-14 MED ORDER — SODIUM CHLORIDE 0.9 % IV BOLUS
1000.0000 mL | Freq: Once | INTRAVENOUS | Status: AC
  Administered 2023-09-14: 1000 mL via INTRAVENOUS

## 2023-09-14 MED ORDER — MONTELUKAST SODIUM 10 MG PO TABS
10.0000 mg | ORAL_TABLET | Freq: Every day | ORAL | Status: DC
  Administered 2023-09-14: 10 mg via ORAL
  Filled 2023-09-14: qty 1

## 2023-09-14 MED ORDER — ONDANSETRON HCL 4 MG PO TABS: 4.0000 mg | ORAL_TABLET | Freq: Four times a day (QID) | ORAL | Status: DC | PRN

## 2023-09-14 MED ORDER — TRAMADOL HCL 50 MG PO TABS: 50.0000 mg | ORAL_TABLET | Freq: Two times a day (BID) | ORAL | Status: DC | PRN

## 2023-09-14 MED ORDER — APIXABAN 2.5 MG PO TABS
2.5000 mg | ORAL_TABLET | Freq: Two times a day (BID) | ORAL | Status: DC
  Administered 2023-09-14: 2.5 mg via ORAL
  Filled 2023-09-14 (×2): qty 1

## 2023-09-14 MED ORDER — SODIUM CHLORIDE 0.9 % IV SOLN
25.0000 mg | Freq: Once | INTRAVENOUS | Status: AC
  Administered 2023-09-14: 25 mg via INTRAVENOUS
  Filled 2023-09-14: qty 25

## 2023-09-14 MED ORDER — OLANZAPINE 5 MG PO TABS
5.0000 mg | ORAL_TABLET | Freq: Every evening | ORAL | Status: DC | PRN
  Administered 2023-09-15: 5 mg via ORAL
  Filled 2023-09-14: qty 1

## 2023-09-14 MED ORDER — FOLIC ACID 1 MG PO TABS: 1.0000 mg | ORAL_TABLET | Freq: Every day | ORAL | Status: DC

## 2023-09-14 MED ORDER — ALBUTEROL SULFATE (2.5 MG/3ML) 0.083% IN NEBU: 2.5000 mg | INHALATION_SOLUTION | Freq: Four times a day (QID) | RESPIRATORY_TRACT | Status: DC | PRN

## 2023-09-14 NOTE — H&P (Addendum)
[place]   PATIENT NAME: [name]    MR#:  [id number]  DATE OF BIRTH:  [birth year]  DATE OF ADMISSION:  09/14/2023  PRIMARY CARE PHYSICIAN: Pcp, No   Patient is coming from: Home  REQUESTING/REFERRING PHYSICIAN: Trinna Post, MD  CHIEF COMPLAINT:   Chief Complaint  Patient presents with   Emesis   Weakness    HISTORY OF PRESENT ILLNESS:  [name] is a 42 y.o. 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 Donneta Romberg), who presented to the emergency room with acute onset of intractable nausea and vomiting since Tuesday for which she has been taking multiple outpatients antiemetics.  He had IV fluids at the cancer clinic on IV fluids.  Since then he has not been able to keep anything down.  He spoke with Dr. Murrell Converse earlier today, who increased his Zofran dose and also sent oral Phenergan prescription.  Despite both he has continued to have recurrent nausea and vomiting to fluids and salt.  He denied any abdominal pain.  No melena or bright red blood per rectum.  No bilious vomitus or hematemesis.  The patient gets regular left thoracentesis for last paracentesis.  Scheduled for paracentesis on Tuesday and had left thoracentesis last week.  ED Course: Upon presentation to the emergency room, BP was 128/102, heart rate 129 and temperature 97.1 with otherwise normal vital signs.  Labs revealed hyponatremia 125 and hypochloremia 94, calcium 7.2, alkaline phosphatase 213, albumin 1.7, total protein 4.6, AST 67 and ALT 73 and total bili 2.3, high-sensitivity troponin I of 19 and later 22.  CBC showed anemia with hemoglobin 10.8 hematocrit 30.5 compared to 13.1 and 38.1 on 09/11/2023.  EKG as reviewed by me : EKG showed sinus tachycardia with a rate of 114, PACs, probable left atrial enlargement, borderline right axis deviation and T wave  inversion anteroseptally. Imaging: Portable chest x-ray showed the following:  Minimal increase in aeration in the left lung, large left pleural effusion observed. No pneumothorax or radiographic evidence of procedure complication.  The patient was given 2 L bolus of IV normal saline, 25 mg of IV Phenergan and 2.5 mg of IV Inapsine.  He will be admitted to an sufficient medical bed for further evaluation and management. 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   PORTA CATH INSERTION N/A 03/28/2021   Procedure: PORTA CATH INSERTION;  Surgeon: Renford Dills, MD;  Location: [facility];  Service: Cardiovascular;  Laterality: N/A;   VIDEO BRONCHOSCOPY WITH ENDOBRONCHIAL NAVIGATION N/A 03/15/2021   Procedure: VIDEO BRONCHOSCOPY WITH ENDOBRONCHIAL NAVIGATION;  Surgeon: Vida Rigger, MD;  Location: [facility];  Service: Thoracic;  Laterality: N/A;   VIDEO BRONCHOSCOPY WITH ENDOBRONCHIAL ULTRASOUND N/A 03/15/2021   Procedure: VIDEO BRONCHOSCOPY WITH ENDOBRONCHIAL ULTRASOUND;  Surgeon: Vida Rigger, MD;  Location: [facility] OR[facility];  Service: Thoracic;  Laterality: N/A;    SOCIAL HISTORY:   Social History   Tobacco Use   Smoking status: Never   Smokeless tobacco: Former    Types: Snuff,  Chew  Substance Use Topics   Alcohol use: Never    FAMILY HISTORY:   Family History  Problem Relation Age of Onset   High Cholesterol Mother    Hypertension Father    Lung cancer Father    Skin cancer Father     DRUG ALLERGIES:   Allergies  Allergen Reactions   Codeine     Makes pt hyper    REVIEW OF SYSTEMS:   ROS As per history of present illness. All pertinent systems were reviewed above. Constitutional, HEENT,  cardiovascular, respiratory, GI, GU, musculoskeletal, neuro, psychiatric, endocrine, integumentary and hematologic systems were reviewed and are otherwise negative/unremarkable except for positive findings mentioned above in the HPI.   MEDICATIONS AT HOME:   Prior to Admission medications   Medication Sig Start Date End Date Taking? Authorizing Provider  albuterol (VENTOLIN HFA) 108 (90 Base) MCG/ACT inhaler Inhale 2 puffs into the lungs every 6 (six) hours as needed for wheezing or shortness of breath. 02/28/23   [name], MD  calcium carbonate (TUMS EX) 750 MG chewable tablet Chew 1 tablet by mouth daily.    [provider]  ELIQUIS 2.5 MG TABS tablet TAKE 1 TABLET BY MOUTH TWICE A DAY 09/02/23   [name], MD  folic acid (FOLVITE) 1 MG tablet TAKE 1 TABLET BY MOUTH EVERY DAY 04/17/23   Alinda Dooms, NP  lidocaine-prilocaine (EMLA) cream Apply 1 Application topically as needed. 10/11/22   Rushie Chestnut, PA-C  methylphenidate (RITALIN) 5 MG tablet Take 1 tablet (5 mg total) by mouth 2 (two) times daily as needed (fatigue). 08/28/23   Borders, Daryl Eastern, NP  metoprolol succinate (TOPROL-XL) 50 MG 24 hr tablet Take 50 mg by mouth daily. Take with or immediately following a meal.    [provider]  montelukast (SINGULAIR) 10 MG tablet TAKE 1 TABLET BY MOUTH EVERYDAY AT BEDTIME 07/26/23   [name], MD  OLANZapine (ZYPREXA) 5 MG tablet Take 1 tablet (5 mg total) by mouth at bedtime as needed (nausea). 09/02/23   Borders, Daryl Eastern, NP  ondansetron (ZOFRAN-ODT) 4 MG disintegrating tablet Take 1 tablet (4 mg total) by mouth every 8 (eight) hours as needed for nausea or vomiting. 06/20/23   Borders, Daryl Eastern, NP  promethazine (PHENERGAN) 25 MG tablet Take 1 tablet (25 mg total) by mouth every 6 (six) hours as needed for nausea or vomiting. 09/14/23   Rickard Patience, MD  sertraline (ZOLOFT) 50 MG tablet Take 1 tablet (50 mg total) by mouth daily. 08/09/23    Borders, Daryl Eastern, NP  traMADol (ULTRAM) 50 MG tablet Take 1 tablet (50 mg total) by mouth every 12 (twelve) hours as needed. 05/27/23   Borders, Daryl Eastern, NP  triamcinolone ointment (KENALOG) 0.5 % Apply 1 Application topically 2 (two) times daily. 08/22/23   [name], MD  Vitamin D, Ergocalciferol, (DRISDOL) 1.25 MG (50000 UNIT) CAPS capsule TAKE 1 CAPSULE BY MOUTH ONE TIME PER WEEK 06/10/23   [name], MD      VITAL SIGNS:  Blood pressure (!) 133/109, pulse (!) 122, temperature (!) 97.1 F (36.2 C), temperature source Axillary, resp. rate (!) 21, height 5\' 10"  (1.778 m), weight 86.2 kg, SpO2 100%.  PHYSICAL EXAMINATION:  Physical Exam  GENERAL:  42 y.o.-year-old male patient lying in the bed with no acute distress.  EYES: Pupils equal, round, reactive to light and accommodation. No scleral icterus. Extraocular muscles intact.  HEENT: Head atraumatic, normocephalic. Oropharynx  and nasopharynx clear.  NECK:  Supple, no jugular venous distention. No thyroid enlargement, no tenderness.  LUNGS: Slightly diminished left basal breath sounds with no wheezing, rales,rhonchi or crepitation. No use of accessory muscles of respiration.  CARDIOVASCULAR: Regular rate and rhythm, S1, S2 normal. No murmurs, rubs, or gallops.  ABDOMEN: Soft, distended with positive shifting dullness, nontender. Bowel sounds present. No organomegaly or mass.  EXTREMITIES: No pedal edema, cyanosis, or clubbing.  NEUROLOGIC: Cranial nerves II through XII are intact. Muscle strength 5/5 in all extremities. Sensation intact. Gait not checked.  PSYCHIATRIC: The patient is alert and oriented x 3.  Normal affect and good eye contact. SKIN: No obvious rash, lesion, or ulcer.   LABORATORY PANEL:   CBC Recent Labs  Lab 09/14/23 1618  WBC 4.4  HGB 10.8*  HCT 30.5*  PLT 45*    ------------------------------------------------------------------------------------------------------------------  Chemistries  Recent Labs  Lab 09/14/23 1618  NA 125*  K 3.9  CL 94*  CO2 25  GLUCOSE 116*  BUN 14  CREATININE 0.63  CALCIUM 7.2*  AST 67*  ALT 73*  ALKPHOS 213*  BILITOT 2.3*   ------------------------------------------------------------------------------------------------------------------  Cardiac Enzymes No results for input(s): "TROPONINI" in the last 168 hours. ------------------------------------------------------------------------------------------------------------------  RADIOLOGY:  No results found.    IMPRESSION AND PLAN:  Assessment and Plan: * Intractable nausea and vomiting - This is likely secondary to chemotherapy. - The patient was admitted to a medical observation bed. - We will continue hydration with IV normal saline with added potassium chloride. - As needed antiemetics will be provided. - We will utilize scheduled IV Reglan if needed. - Diet will be advanced as tolerated.  Hyponatremia - This is likely hypovolemic due to intractable nausea and vomiting. - We will continue addition with IV normal saline. - We will follow sodium levels.  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 - The patient is on active chemotherapy. - He follows with Dr. Donneta Romberg. - The patient has associated left pleural effusion and ascites.  Depression - We will continue Zoloft and Zyprexa.  Essential hypertension - We will continue antihypertensive therapy.   DVT prophylaxis: Eliquis.   Advanced Care Planning:  Code Status: The patient is DNR only.  This was discussed with him. Family Communication:  The plan of care was discussed in details with the patient (and family). I answered all questions. The patient agreed to proceed with the above mentioned plan. Further management  will depend upon hospital course. Disposition Plan: Back to previous home environment Consults called: none.  All the records are reviewed and case discussed with ED provider.  Status is: Observation  I certify that at the time of admission, it is my clinical judgment that the patient will require inpatient hospital care extending more than 2 midnights.                            Dispo: The patient is from: Home              Anticipated d/c is to: Home              Patient currently is not medically stable to d/c.              Difficult to place patient: No  Hannah Beat M.D on 09/14/2023 at 10:18 PM  [facility]   From 7 PM-7 AM, contact  night-coverage [email or web address]  CC: Primary care physician; Pcp, No

## 2023-09-14 NOTE — Assessment & Plan Note (Signed)
-  We will continue Zoloft and Zyprexa

## 2023-09-14 NOTE — Assessment & Plan Note (Addendum)
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 - The patient is on active chemotherapy. - He follows with Dr. Donneta Romberg. - The patient has associated left pleural effusion and ascites.

## 2023-09-14 NOTE — Telephone Encounter (Signed)
Patient called to report that he has nausea, can not keep anything down.  He has compazine which did not help and zofran at home. Last used Zofran 4mg  2 days ago.   I recommend patient to increase Zofran disintegrating tablet to 8mg  Q8 hours PRN.  Also sent a Rx of phenergan 25mg  Q6h PRN nausea.  I advise patient to go to ER if nausea/vomiting does not improve despite using both antiemetics.  Cc Dr. Donneta Romberg.

## 2023-09-14 NOTE — ED Triage Notes (Signed)
Patient presents with emesis. 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 Patient told by doctor to come here and get fluids.  Receives chemo once every three weeks.

## 2023-09-14 NOTE — Assessment & Plan Note (Signed)
-   We will continue anti-hypertensive therapy. 

## 2023-09-14 NOTE — ED Provider Notes (Signed)
[facility] Provider Note    Event Date/Time   First MD Initiated Contact with Patient 09/14/23 1508     (approximate)   History   Emesis and Weakness   HPI  [name] is a 42 year old male with history of metastatic lung cancer with bone and brain mets presenting to the emergency department for evaluation of nausea and vomiting.  Patient has been having ongoing issues with nausea and vomiting that he has been taking multiple outpatient antiemetics for.  On Tuesday, he got some IV fluids in the clinic.  Since that time, he has not really been able to keep down anything.  He spoke with Dr. Cathie Hoops with oncology earlier today who increased his dose of Zofran and also sent oral Phenergan, but despite this, patient has continued to have vomiting with any fluids or solids.  In the setting of this, presents to the ER.  Denies abdominal pain.  Has been having continued bowel movements.  Does report he has had multiple CT scans over the past few months without acute findings.  Last chemotherapy was last Friday, next treatment planned for 3 weeks following that.    Physical Exam   Triage Vital Signs: ED Triage Vitals  Encounter Vitals Group     BP 09/14/23 1437 (!) 131/103     Systolic BP Percentile --      Diastolic BP Percentile --      Pulse Rate 09/14/23 1437 (!) 120     Resp 09/14/23 1437 18     Temp 09/14/23 1437 97.6 F (36.4 C)     Temp Source 09/14/23 1437 Oral     SpO2 09/14/23 1437 100 %     Weight 09/14/23 1438 190 lb (86.2 kg)     Height 09/14/23 1438 5\' 10"  (1.778 m)     Head Circumference --      Peak Flow --      Pain Score 09/14/23 1438 0     Pain Loc --      Pain Education --      Exclude from Growth Chart --     Most recent vital signs: Vitals:   09/14/23 2220 09/14/23 2335  BP: 119/84 117/73  Pulse: (!) 122 (!) 110  Resp:  16  Temp:  98.1 F (36.7 C)  SpO2:  97%     General: Awake, interactive  CV:  Tachycardia with regular  rhythm, normal peripheral perfusion Resp:  Unlabored respirations, lungs clear to auscultation.  Port in place over the right chest without surrounding erythema Abd:  Mild fullness with fluid wave consistent with ascites, nontender to palpation diffusely, not tense Neuro:  Symmetric facial movement, fluid speech   ED Results / Procedures / Treatments   Labs (all labs ordered are listed, but only abnormal results are displayed) Labs Reviewed  COMPREHENSIVE METABOLIC PANEL - Abnormal; Notable for the following components:      Result Value   Sodium 125 (*)    Chloride 94 (*)    Glucose, Bld 116 (*)    Calcium 7.2 (*)    Total Protein 4.6 (*)    Albumin 1.7 (*)    AST 67 (*)    ALT 73 (*)    Alkaline Phosphatase 213 (*)    Total Bilirubin 2.3 (*)    All other components within normal limits  CBC WITH DIFFERENTIAL/PLATELET - Abnormal; Notable for the following components:   RBC 3.35 (*)    Hemoglobin 10.8 (*)  HCT 30.5 (*)    Platelets 45 (*)    All other components within normal limits  TROPONIN I (HIGH SENSITIVITY) - Abnormal; Notable for the following components:   Troponin I (High Sensitivity) 19 (*)    All other components within normal limits  TROPONIN I (HIGH SENSITIVITY) - Abnormal; Notable for the following components:   Troponin I (High Sensitivity) 22 (*)    All other components within normal limits  LIPASE, BLOOD  HIV ANTIBODY (ROUTINE TESTING W REFLEX)  BASIC METABOLIC PANEL  CBC     EKG EKG independently reviewed interpreted by myself (ER attending) demonstrates:  EKG demonstrates sinus tachycardia at a rate of 114, PR 141, QRS 92, QTc 419, nonspecific ST changes with some ST depression most notable in V2, interpreted by computer as acute MI, but no STEMI on my review, similar to prior from 03/28/2021.  Repeat obtained at 1539 without significant change.  RADIOLOGY Imaging independently reviewed and interpreted by myself demonstrates:     PROCEDURES:  Critical Care performed: Yes, see critical care procedure note(s)  CRITICAL CARE Performed by: Trinna Post   Total critical care time: 30 minutes  Critical care time was exclusive of separately billable procedures and treating other patients.  Critical care was necessary to treat or prevent imminent or life-threatening deterioration.  Critical care was time spent personally by me on the following activities: development of treatment plan with patient and/or surrogate as well as nursing, discussions with consultants, evaluation of patient's response to treatment, examination of patient, obtaining history from patient or surrogate, ordering and performing treatments and interventions, ordering and review of laboratory studies, ordering and review of radiographic studies, pulse oximetry and re-evaluation of patient's condition.   Procedures   MEDICATIONS ORDERED IN ED: Medications  traMADol (ULTRAM) tablet 50 mg (has no administration in time range)  metoprolol succinate (TOPROL-XL) 24 hr tablet 50 mg (50 mg Oral Given 09/14/23 2220)  methylphenidate (RITALIN) tablet 5 mg (has no administration in time range)  OLANZapine (ZYPREXA) tablet 5 mg (5 mg Oral Given 09/15/23 0027)  sertraline (ZOLOFT) tablet 50 mg (has no administration in time range)  calcium carbonate (TUMS - dosed in mg elemental calcium) chewable tablet 200 mg of elemental calcium (200 mg of elemental calcium Oral Given 09/14/23 2220)  apixaban (ELIQUIS) tablet 2.5 mg (2.5 mg Oral Given 09/14/23 2230)  folic acid (FOLVITE) tablet 1 mg (has no administration in time range)  albuterol (PROVENTIL) (2.5 MG/3ML) 0.083% nebulizer solution 2.5 mg (has no administration in time range)  montelukast (SINGULAIR) tablet 10 mg (10 mg Oral Given 09/14/23 2220)  0.9 % NaCl with KCl 20 mEq/ L  infusion ( Intravenous New Bag/Given 09/14/23 2222)  acetaminophen (TYLENOL) tablet 650 mg (has no administration in time range)     Or  acetaminophen (TYLENOL) suppository 650 mg (has no administration in time range)  traZODone (DESYREL) tablet 25 mg (has no administration in time range)  magnesium hydroxide (MILK OF MAGNESIA) suspension 30 mL (has no administration in time range)  ondansetron (ZOFRAN) tablet 4 mg (has no administration in time range)    Or  ondansetron (ZOFRAN) injection 4 mg (has no administration in time range)  sodium chloride 0.9 % bolus 1,000 mL (0 mLs Intravenous Stopped 09/14/23 1914)  promethazine (PHENERGAN) 25 mg in sodium chloride 0.9 % 50 mL IVPB (0 mg Intravenous Stopped 09/14/23 1902)  sodium chloride 0.9 % bolus 1,000 mL (1,000 mLs Intravenous New Bag/Given 09/14/23 1901)  droperidol (INAPSINE) 2.5 MG/ML injection  2.5 mg (2.5 mg Intravenous Given 09/14/23 1922)     IMPRESSION / MDM / ASSESSMENT AND PLAN / ED COURSE  I reviewed the triage vital signs and the nursing notes.  Differential diagnosis includes, but is not limited to, side effects from chemotherapy, lower suspicion acute intra-abdominal process given reassuring abdominal exam, electrolyte abnormality  Patient's presentation is most consistent with acute presentation with potential threat to life or bodily function.  42 year old male presenting with intractable vomiting.  Tachycardic on presentation with stable blood pressure.  Given IV fluids and IV Phenergan.  Labs with mild anemia, hyponatremia with sodium of 125, but not significantly changed from recent prior.  Stable transaminitis.  Patient did have abnormal EKG though no cardiorespiratory symptoms, troponin obtained which is minimally elevated, suspect due to demand in the setting of significant vomiting.  Stable on repeat.  After Phenergan, patient initially did feel somewhat improved, but unfortunately after p.o. trial had multiple episodes of recurrent vomiting.  Will trial droperidol, but with persistent tachycardia and intractable vomiting, do think he is appropriate for  admission.  Will discuss with hospitalist team.      FINAL CLINICAL IMPRESSION(S) / ED DIAGNOSES   Final diagnoses:  Intractable vomiting     Rx / DC Orders   ED Discharge Orders     None        Note:  This document was prepared using Dragon voice recognition software and may include unintentional dictation errors.   Trinna Post, MD 09/15/23 [phone]

## 2023-09-14 NOTE — Assessment & Plan Note (Addendum)
-   This is likely hypovolemic due to intractable nausea and vomiting. - We will continue addition with IV normal saline. - We will follow sodium levels.

## 2023-09-14 NOTE — Assessment & Plan Note (Signed)
-   This is likely secondary to chemotherapy. - The patient was admitted to a medical observation bed. - We will continue hydration with IV normal saline with added potassium chloride. - As needed antiemetics will be provided. - We will utilize scheduled IV Reglan if needed. - Diet will be advanced as tolerated.

## 2023-09-15 DIAGNOSIS — R112 Nausea with vomiting, unspecified: Secondary | ICD-10-CM | POA: Diagnosis not present

## 2023-09-15 LAB — BASIC METABOLIC PANEL
Anion gap: 7 (ref 5–15)
BUN: 15 mg/dL (ref 6–20)
CO2: 23 mmol/L (ref 22–32)
Calcium: 7.4 mg/dL — ABNORMAL LOW (ref 8.9–10.3)
Chloride: 96 mmol/L — ABNORMAL LOW (ref 98–111)
Creatinine, Ser: 0.62 mg/dL (ref 0.61–1.24)
GFR, Estimated: >60 mL/min (ref >60)
Glucose, Bld: 107 mg/dL — ABNORMAL HIGH (ref 70–99)
Potassium: 4.3 mmol/L (ref 3.5–5.1)
Sodium: 126 mmol/L — ABNORMAL LOW (ref 135–145)

## 2023-09-15 LAB — HIV ANTIBODY (ROUTINE TESTING W REFLEX): HIV Screen 4th Generation wRfx: NONREACTIVE

## 2023-09-15 LAB — CBC
HCT: 28.7 % — ABNORMAL LOW (ref 39.0–52.0)
Hemoglobin: 10 g/dL — ABNORMAL LOW (ref 13.0–17.0)
MCH: 32.4 pg (ref 26.0–34.0)
MCHC: 34.8 g/dL (ref 30.0–36.0)
MCV: 92.9 fL (ref 80.0–100.0)
Platelets: 35 10*3/uL — ABNORMAL LOW (ref 150–400)
RBC: 3.09 MIL/uL — ABNORMAL LOW (ref 4.22–5.81)
RDW: 14.8 % (ref 11.5–15.5)
WBC: 4 10*3/uL (ref 4.0–10.5)
nRBC: 0 % (ref 0.0–0.2)

## 2023-09-15 MED ORDER — METOCLOPRAMIDE HCL 10 MG PO TABS: 10.0000 mg | ORAL_TABLET | Freq: Three times a day (TID) | ORAL | 1 refills | Status: DC | PRN

## 2023-09-15 MED ORDER — PROCHLORPERAZINE MALEATE 10 MG PO TABS: 10.0000 mg | ORAL_TABLET | Freq: Four times a day (QID) | ORAL | 0 refills | Status: DC | PRN

## 2023-09-15 MED ORDER — HEPARIN SOD (PORK) LOCK FLUSH 100 UNIT/ML IV SOLN
INTRAVENOUS | Status: AC
  Filled 2023-09-15: qty 5

## 2023-09-15 NOTE — Discharge Summary (Signed)
Physician Discharge Summary  [name] GNF:[id number] DOB: 07-10-1981 DOA: 09/14/2023  PCP: Pcp, No  Admit date: 09/14/2023 Discharge date: 09/15/2023  Admitted From: Home Disposition: Home  Recommendations for Outpatient Follow-up:  Follow up with PCP in 1-2 weeks Follow-up oncology to 3 days  Home Health: No Equipment/Devices: None  Discharge Condition: Stable CODE STATUS: DNR Diet recommendation: Soft/bland  Brief/Interim Summary: 42 y.o. 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 Donneta Romberg), who presented to the emergency room with acute onset of intractable nausea and vomiting since Tuesday for which she has been taking multiple outpatients antiemetics.  He had IV fluids at the cancer clinic on IV fluids.  Since then he has not been able to keep anything down.  He spoke with Dr. Murrell Converse earlier today, who increased his Zofran dose and also sent oral Phenergan prescription.  Despite both he has continued to have recurrent nausea and vomiting to fluids and salt.  He denied any abdominal pain.  No melena or bright red blood per rectum.  No bilious vomitus or hematemesis.  The patient gets regular left thoracentesis for last paracentesis.  Scheduled for paracentesis on Tuesday and had left thoracentesis last week.   Maintained on IV antiemetics with some result.  Was able to tolerate liquids without issue at time of discharge.  I had a lengthy discussion with patient and family member at bedside.  Stated that ideally we should keep patient admitted until confirmation that he is tolerating at least a soft diet.  Patient states that he feels well and can continue continue titration of his antiemetic regimen at home.  I did add Reglan and Compazine to home antiemetic regimen which included Zofran and Phenergan.  Instructed not to double up on any of  these medications.  Patient will follow-up in cancer center early next week.    Discharge Diagnoses:  Principal Problem:   Intractable nausea and vomiting Active Problems:   Hyponatremia   Cancer of lower lobe of left lung (HCC)   Essential hypertension   Depression  * Intractable nausea and vomiting Symptoms improved at time of discharge.  Patient responded to intravenous antiemetic therapy.  He is on a liquid diet at time of DC and does not wish to stay for advancement while admitted.  I gave him prescriptions for Compazine and Reglan and instructed not to double up on any medications.  Instructed to find which medication worked best and to use that approximately 20 to 30 minutes prior to attempting p.o. intake.  Patient expressed understanding.  Discharged home.  Follow-up outpatient cancer center and PCP.   Hyponatremia Likely secondary to intractable nausea and vomiting and resultant intravascular volume depletion.  Also unable to exclude element of SIADH in setting of active malignancy.  Relatively stable and asymptomatic.  Follow-up outpatient PCP 1 week for repeat labs.   Discharge Instructions  Discharge Instructions     Diet - low sodium heart healthy   Complete by: As directed    Increase activity slowly   Complete by: As directed       Allergies as of 09/15/2023       Reactions   Codeine    Makes pt hyper        Medication List     STOP taking these medications    traMADol 50 MG tablet Commonly known as: ULTRAM   Vitamin D (Ergocalciferol) 1.25  MG (50000 UNIT) Caps capsule Commonly known as: DRISDOL       TAKE these medications    albuterol 108 (90 Base) MCG/ACT inhaler Commonly known as: VENTOLIN HFA Inhale 2 puffs into the lungs every 6 (six) hours as needed for wheezing or shortness of breath.   calcium carbonate 750 MG chewable tablet Commonly known as: TUMS EX Chew 1 tablet by mouth daily.   Eliquis 2.5 MG Tabs tablet Generic drug:  apixaban TAKE 1 TABLET BY MOUTH TWICE A DAY   folic acid 1 MG tablet Commonly known as: FOLVITE TAKE 1 TABLET BY MOUTH EVERY DAY   lidocaine-prilocaine cream Commonly known as: EMLA Apply 1 Application topically as needed.   methylphenidate 5 MG tablet Commonly known as: Ritalin Take 1 tablet (5 mg total) by mouth 2 (two) times daily as needed (fatigue).   metoCLOPramide 10 MG tablet Commonly known as: REGLAN Take 1 tablet (10 mg total) by mouth every 8 (eight) hours as needed for nausea.   metoprolol succinate 50 MG 24 hr tablet Commonly known as: TOPROL-XL Take 50 mg by mouth daily. Take with or immediately following a meal.   montelukast 10 MG tablet Commonly known as: SINGULAIR TAKE 1 TABLET BY MOUTH EVERYDAY AT BEDTIME   OLANZapine 5 MG tablet Commonly known as: ZyPREXA Take 1 tablet (5 mg total) by mouth at bedtime as needed (nausea).   ondansetron 4 MG disintegrating tablet Commonly known as: ZOFRAN-ODT Take 1 tablet (4 mg total) by mouth every 8 (eight) hours as needed for nausea or vomiting.   prochlorperazine 10 MG tablet Commonly known as: COMPAZINE Take 1 tablet (10 mg total) by mouth every 6 (six) hours as needed for nausea or vomiting.   promethazine 25 MG tablet Commonly known as: PHENERGAN Take 1 tablet (25 mg total) by mouth every 6 (six) hours as needed for nausea or vomiting.   sertraline 50 MG tablet Commonly known as: ZOLOFT Take 1 tablet (50 mg total) by mouth daily.   triamcinolone ointment 0.5 % Commonly known as: KENALOG Apply 1 Application topically 2 (two) times daily.        Allergies  Allergen Reactions   Codeine     Makes pt hyper    Consultations: None   Procedures/Studies: DG Chest Port 1 View  Result Date: 09/09/2023 CLINICAL DATA:  Left lower lobe lung cancer. Status post left thoracentesis. EXAM: PORTABLE CHEST 1 VIEW COMPARISON:  09/06/2023 FINDINGS: Power injectable Port-A-Cath tip: Cavoatrial junction. Minimal  increase in aeration in the left lung, large left pleural effusion observed. No pneumothorax or radiographic evidence of procedure complication. The right lung remains clear. Heart size within normal limits although the left apical border remains obscured. IMPRESSION: 1. Minimal increase in aeration in the left lung, large left pleural effusion observed. No pneumothorax or radiographic evidence of procedure complication. Electronically Signed   By: Gaylyn Rong M.D.   On: 09/09/2023 14:38   US THORACENTESIS ASP PLEURAL SPACE W/IMG GUIDE  Result Date: 09/09/2023 INDICATION: Patient with history of left lung cancer with recurrent left-sided pleural effusion. Request is for therapeutic thoracentesis EXAM: ULTRASOUND GUIDED THERAPEUTIC LEFT-SIDED THORACENTESIS MEDICATIONS: Lidocaine 1% 10 mL COMPLICATIONS: None immediate. PROCEDURE: An ultrasound guided thoracentesis was thoroughly discussed with the patient and questions answered. The benefits, risks, alternatives and complications were also discussed. The patient understands and wishes to proceed with the procedure. Written consent was obtained. Ultrasound was performed to localize and mark an adequate pocket of fluid in the left chest. The  area was then prepped and draped in the normal sterile fashion. 1% Lidocaine was used for local anesthesia. Under ultrasound guidance a 6 Fr Safe-T-Centesis catheter was introduced. Thoracentesis was performed. The catheter was removed and a dressing applied. FINDINGS: A total of approximately 450 mL of reddish brown fluid was removed. Samples were sent to the laboratory as requested by the clinical team. IMPRESSION: Successful ultrasound guided therapeutic left-sided thoracentesis yielding 450 mL of pleural fluid. Performed by Anders Grant NP Electronically Signed   By: Judie Petit.  Shick M.D.   On: 09/09/2023 13:58   DG Chest 2 View  Result Date: 09/06/2023 CLINICAL DATA:  Cough and shortness of breath. History of  lung cancer. EXAM: CHEST - 2 VIEW COMPARISON:  Chest x-ray dated August 13, 2023. FINDINGS: Unchanged right chest wall port catheter. Stable cardiomediastinal silhouette with normal heart size. Unchanged chronic loculated large left pleural effusion with atelectasis in the lingula and left lower lobe. Slightly increased interstitial thickening in the right lung. No pneumothorax. No acute osseous abnormality. IMPRESSION: 1. Slightly increased interstitial thickening in the right lung, which may reflect atypical infection or pulmonary edema. 2. Unchanged chronic loculated large left pleural effusion. Electronically Signed   By: Obie Dredge M.D.   On: 09/06/2023 17:11   US Paracentesis  Result Date: 09/06/2023 INDICATION: malignant ascites EXAM: ULTRASOUND GUIDED  PARACENTESIS MEDICATIONS: None. COMPLICATIONS: None immediate. PROCEDURE: Informed written consent was obtained from the patient after a discussion of the risks, benefits and alternatives to treatment. A timeout was performed prior to the initiation of the procedure. Initial ultrasound scanning demonstrates a large amount of ascites within the right lower abdominal quadrant. The right lower abdomen was prepped and draped in the usual sterile fashion. 1% lidocaine was used for local anesthesia. Following this, a 19 gauge, 7-cm, Yueh catheter was introduced. An ultrasound image was saved for documentation purposes. The paracentesis was performed. The catheter was removed and a dressing was applied. The patient tolerated the procedure well without immediate post procedural complication. FINDINGS: A total of approximately 4.8 L of clear, straw-colored fluid was removed. IMPRESSION: Successful ultrasound-guided paracentesis yielding 4.8 liters of peritoneal fluid. Electronically Signed   By: Olive Bass M.D.   On: 09/06/2023 16:39   US Paracentesis  Result Date: 08/23/2023 INDICATION: Malignant ascites. Request received for diagnostic and  therapeutic paracentesis EXAM: ULTRASOUND GUIDED  PARACENTESIS MEDICATIONS: 10 cc 1% lidocaine COMPLICATIONS: None immediate. PROCEDURE: Informed written consent was obtained from the patient after a discussion of the risks, benefits and alternatives to treatment. A timeout was performed prior to the initiation of the procedure. Initial ultrasound scanning demonstrates a large amount of ascites within the right lower abdominal quadrant. The right lower abdomen was prepped and draped in the usual sterile fashion. 1% lidocaine was used for local anesthesia. Following this, a 19 gauge, 10-cm, Yueh catheter was introduced. An ultrasound image was saved for documentation purposes. The paracentesis was performed. The catheter was removed and a dressing was applied. The patient tolerated the procedure well without immediate post procedural complication. FINDINGS: A total of approximately 4.1 L of amber fluid was removed. Samples were sent to the laboratory as requested by the clinical team. IMPRESSION: Successful ultrasound-guided paracentesis yielding 4.1 liters of peritoneal fluid. Procedure performed by Mina Marble, PA-C Electronically Signed   By: Olive Bass M.D.   On: 08/23/2023 16:43      Subjective: Seen and examined on the day of discharge.  Stable no distress.  Appropriate discharge home.  Discharge  Exam: Vitals:   09/15/23 0540 09/15/23 0659  BP:  126/82  Pulse:  66  Resp:  18  Temp: 97.7 F (36.5 C)   SpO2:  99%   Vitals:   09/14/23 2335 09/15/23 0342 09/15/23 0540 09/15/23 0659  BP: 117/73 (!) 120/91  126/82  Pulse: (!) 110 92  66  Resp: 16 16  18   Temp: 98.1 F (36.7 C)  97.7 F (36.5 C)   TempSrc: Oral  Oral   SpO2: 97% 92%  99%  Weight:      Height:        General: Pt is alert, awake, not in acute distress Cardiovascular: RRR, S1/S2 +, no rubs, no gallops Respiratory: CTA bilaterally, no wheezing, no rhonchi Abdominal: Soft, NT, ND, bowel sounds + Extremities: no  edema, no cyanosis    The results of significant diagnostics from this hospitalization (including imaging, microbiology, ancillary and laboratory) are listed below for reference.     Microbiology: No results found for this or any previous visit (from the past 240 hour(s)).   Labs: BNP (last 3 results) No results for input(s): "BNP" in the last 8760 hours. Basic Metabolic Panel: Recent Labs  Lab 09/11/23 0940 09/14/23 1618 09/15/23 0527  NA 129* 125* 126*  K 3.6 3.9 4.3  CL 96* 94* 96*  CO2 25 25 23   GLUCOSE 122* 116* 107*  BUN 13 14 15   CREATININE 0.56* 0.63 0.62  CALCIUM 7.7* 7.2* 7.4*   Liver Function Tests: Recent Labs  Lab 09/11/23 0940 09/14/23 1618  AST 80* 67*  ALT 79* 73*  ALKPHOS 264* 213*  BILITOT 2.8* 2.3*  PROT 5.0* 4.6*  ALBUMIN 2.0* 1.7*   Recent Labs  Lab 09/14/23 1618  LIPASE 46   No results for input(s): "AMMONIA" in the last 168 hours. CBC: Recent Labs  Lab 09/11/23 0940 09/14/23 1618 09/15/23 0527  WBC 6.6 4.4 4.0  NEUTROABS 5.7 3.1  --   HGB 13.1 10.8* 10.0*  HCT 38.1* 30.5* 28.7*  MCV 93.2 91.0 92.9  PLT 112* 45* 35*   Cardiac Enzymes: No results for input(s): "CKTOTAL", "CKMB", "CKMBINDEX", "TROPONINI" in the last 168 hours. BNP: Invalid input(s): "POCBNP" CBG: No results for input(s): "GLUCAP" in the last 168 hours. D-Dimer No results for input(s): "DDIMER" in the last 72 hours. Hgb A1c No results for input(s): "HGBA1C" in the last 72 hours. Lipid Profile No results for input(s): "CHOL", "HDL", "LDLCALC", "TRIG", "CHOLHDL", "LDLDIRECT" in the last 72 hours. Thyroid function studies No results for input(s): "TSH", "T4TOTAL", "T3FREE", "THYROIDAB" in the last 72 hours.  Invalid input(s): "FREET3" Anemia work up No results for input(s): "VITAMINB12", "FOLATE", "FERRITIN", "TIBC", "IRON", "RETICCTPCT" in the last 72 hours. Urinalysis    Component Value Date/Time   COLORURINE YELLOW (A) 07/23/2023 1446   APPEARANCEUR  CLEAR (A) 07/23/2023 1446   LABSPEC 1.013 07/23/2023 1446   PHURINE 5.0 07/23/2023 1446   GLUCOSEU NEGATIVE 07/23/2023 1446   HGBUR NEGATIVE 07/23/2023 1446   BILIRUBINUR NEGATIVE 07/23/2023 1446   KETONESUR NEGATIVE 07/23/2023 1446   PROTEINUR NEGATIVE 07/23/2023 1446   NITRITE NEGATIVE 07/23/2023 1446   LEUKOCYTESUR NEGATIVE 07/23/2023 1446   Sepsis Labs Recent Labs  Lab 09/11/23 0940 09/14/23 1618 09/15/23 0527  WBC 6.6 4.4 4.0   Microbiology No results found for this or any previous visit (from the past 240 hour(s)).   Time coordinating discharge: Over 30 minutes  SIGNED:   Tresa Moore, MD  [facility] 09/15/2023, 8:21 AM Pager  If 7PM-7AM, please contact night-coverage

## 2023-09-15 NOTE — ED Notes (Signed)
Pt denies nausea at this time.

## 2023-09-15 NOTE — ED Notes (Signed)
Pt ambulated to bathroom and back independently, gait steady.

## 2023-09-15 NOTE — Discharge Instructions (Signed)
You have been provided prescriptions for Compazine and Reglan.  These medications acts similarly to your previously prescribed Zofran and Phenergan.  Please do not exceed dosing restrictions on any of the medications.  Do not take any 2 medications at the same time.  You can alternate medications and assess which 1 works best to treat your nausea.  I would suggest taking 1 antinausea pill approximately 20 to 30 minutes prior to attempting any oral intake.  I would suggest plenty of fluids, diet supplemented with protein shakes, soft bland foods and foods that are easy on the system.

## 2023-09-16 ENCOUNTER — Encounter: Payer: Self-pay | Admitting: Nurse Practitioner

## 2023-09-16 ENCOUNTER — Encounter: Payer: Self-pay | Admitting: Internal Medicine

## 2023-09-16 NOTE — Progress Notes (Signed)
Clinical Summary 2024-10 Name: [name], [name] DOB: 1981-07-18 Patient MRN: [id number] Referring Provider: Laurette Schimke [facility] Name: Marissa Nestle Date of Last Psychiatric Consultant Review: 2023-08-15 Completed Behavioral Health Coach Visits Completed [facility] Visits 07/18/23 07/25/23 08/01/23 Total Care Time: 123 minutes Diagnosis(es): F43.20: Adjustment disorder, unspecified Most Recent Assessments Depression Assessment (PHQ-9) NA Anxiety Assessment (GAD-7) NA PTSD Checklist Assessment (PCL-5) NA BAM - Use NA BAM - Risk Factor NA BAM - Protective Factor NA Current Medications (Name, Dose) Sertraline HCl Oral Tablet - 50 MG Ritalin Oral Tablet - 5 MG No changes to psychiatric medication recommendations. No new action requested. Please reference last Care Plan for most up-to-date behavioral health recommendations. Safety Plan: No Safety Plan Date:

## 2023-09-17 ENCOUNTER — Inpatient Hospital Stay: Payer: 59 | Admitting: Hospice and Palliative Medicine

## 2023-09-17 ENCOUNTER — Inpatient Hospital Stay: Payer: 59 | Attending: Internal Medicine

## 2023-09-17 ENCOUNTER — Encounter: Payer: Self-pay | Admitting: Nurse Practitioner

## 2023-09-17 ENCOUNTER — Inpatient Hospital Stay: Payer: 59 | Admitting: Nurse Practitioner

## 2023-09-17 ENCOUNTER — Inpatient Hospital Stay (HOSPITAL_BASED_OUTPATIENT_CLINIC_OR_DEPARTMENT_OTHER): Payer: 59 | Admitting: Nurse Practitioner

## 2023-09-17 ENCOUNTER — Ambulatory Visit
Admission: RE | Admit: 2023-09-17 | Discharge: 2023-09-17 | Disposition: A | Discharge status: Home / Self Care | Payer: Managed Care, Other (non HMO) | Source: Ambulatory Visit | Attending: Hospice and Palliative Medicine | Admitting: Hospice and Palliative Medicine

## 2023-09-17 ENCOUNTER — Inpatient Hospital Stay: Payer: 59

## 2023-09-17 VITALS — BP 135/93 | HR 110 | Temp 98.2°F

## 2023-09-17 DIAGNOSIS — R112 Nausea with vomiting, unspecified: Secondary | ICD-10-CM | POA: Diagnosis not present

## 2023-09-17 DIAGNOSIS — E871 Hypo-osmolality and hyponatremia: Secondary | ICD-10-CM | POA: Diagnosis not present

## 2023-09-17 DIAGNOSIS — R180 Malignant ascites: Secondary | ICD-10-CM | POA: Diagnosis present

## 2023-09-17 DIAGNOSIS — J910 Malignant pleural effusion: Secondary | ICD-10-CM | POA: Insufficient documentation

## 2023-09-17 DIAGNOSIS — Z09 Encounter for follow-up examination after completed treatment for conditions other than malignant neoplasm: Secondary | ICD-10-CM

## 2023-09-17 DIAGNOSIS — C7931 Secondary malignant neoplasm of brain: Secondary | ICD-10-CM | POA: Insufficient documentation

## 2023-09-17 DIAGNOSIS — D696 Thrombocytopenia, unspecified: Secondary | ICD-10-CM

## 2023-09-17 DIAGNOSIS — C7951 Secondary malignant neoplasm of bone: Secondary | ICD-10-CM | POA: Diagnosis not present

## 2023-09-17 DIAGNOSIS — D702 Other drug-induced agranulocytosis: Secondary | ICD-10-CM | POA: Diagnosis not present

## 2023-09-17 DIAGNOSIS — C3432 Malignant neoplasm of lower lobe, left bronchus or lung: Secondary | ICD-10-CM | POA: Insufficient documentation

## 2023-09-17 DIAGNOSIS — C787 Secondary malignant neoplasm of liver and intrahepatic bile duct: Secondary | ICD-10-CM | POA: Insufficient documentation

## 2023-09-17 DIAGNOSIS — D649 Anemia, unspecified: Secondary | ICD-10-CM | POA: Diagnosis not present

## 2023-09-17 DIAGNOSIS — D701 Agranulocytosis secondary to cancer chemotherapy: Secondary | ICD-10-CM | POA: Diagnosis not present

## 2023-09-17 DIAGNOSIS — Z515 Encounter for palliative care: Secondary | ICD-10-CM | POA: Diagnosis not present

## 2023-09-17 LAB — CBC WITH DIFFERENTIAL (CANCER CENTER ONLY)
Abs Immature Granulocytes: 0.01 10*3/uL (ref 0.00–0.07)
Basophils Absolute: 0 10*3/uL (ref 0.0–0.1)
Basophils Relative: 2 %
Eosinophils Absolute: 0 10*3/uL (ref 0.0–0.5)
Eosinophils Relative: 1 %
HCT: 29.6 % — ABNORMAL LOW (ref 39.0–52.0)
Hemoglobin: 10.4 g/dL — ABNORMAL LOW (ref 13.0–17.0)
Immature Granulocytes: 0 %
Lymphocytes Relative: 26 %
Lymphs Abs: 0.7 10*3/uL (ref 0.7–4.0)
MCH: 32 pg (ref 26.0–34.0)
MCHC: 35.1 g/dL (ref 30.0–36.0)
MCV: 91.1 fL (ref 80.0–100.0)
Monocytes Absolute: 0.4 10*3/uL (ref 0.1–1.0)
Monocytes Relative: 16 %
Neutro Abs: 1.5 10*3/uL — ABNORMAL LOW (ref 1.7–7.7)
Neutrophils Relative %: 55 %
Platelet Count: 23 10*3/uL — ABNORMAL LOW (ref 150–400)
RBC: 3.25 MIL/uL — ABNORMAL LOW (ref 4.22–5.81)
RDW: 14.5 % (ref 11.5–15.5)
WBC Count: 2.7 10*3/uL — ABNORMAL LOW (ref 4.0–10.5)
nRBC: 0 % (ref 0.0–0.2)

## 2023-09-17 LAB — CMP (CANCER CENTER ONLY)
ALT: 80 U/L — ABNORMAL HIGH (ref 0–44)
AST: 69 U/L — ABNORMAL HIGH (ref 15–41)
Albumin: 1.8 g/dL — ABNORMAL LOW (ref 3.5–5.0)
Alkaline Phosphatase: 234 U/L — ABNORMAL HIGH (ref 38–126)
Anion gap: 8 (ref 5–15)
BUN: 13 mg/dL (ref 6–20)
CO2: 21 mmol/L — ABNORMAL LOW (ref 22–32)
Calcium: 7.3 mg/dL — ABNORMAL LOW (ref 8.9–10.3)
Chloride: 91 mmol/L — ABNORMAL LOW (ref 98–111)
Creatinine: 0.64 mg/dL (ref 0.61–1.24)
GFR, Estimated: >60 mL/min (ref >60)
Glucose, Bld: 112 mg/dL — ABNORMAL HIGH (ref 70–99)
Potassium: 3.9 mmol/L (ref 3.5–5.1)
Sodium: 120 mmol/L — ABNORMAL LOW (ref 135–145)
Total Bilirubin: 2.2 mg/dL — ABNORMAL HIGH (ref <1.2)
Total Protein: 4.6 g/dL — ABNORMAL LOW (ref 6.5–8.1)

## 2023-09-17 MED ORDER — LIDOCAINE HCL (PF) 1 % IJ SOLN
10.0000 mL | Freq: Once | INTRAMUSCULAR | Status: AC
  Administered 2023-09-17: 10 mL via SUBCUTANEOUS
  Filled 2023-09-17: qty 10

## 2023-09-17 MED ORDER — SODIUM CHLORIDE 0.9% FLUSH
10.0000 mL | Freq: Once | INTRAVENOUS | Status: AC
Start: 2023-09-17 — End: 2023-09-17
  Administered 2023-09-17: 10 mL via INTRAVENOUS
  Filled 2023-09-17: qty 10

## 2023-09-17 MED ORDER — SODIUM CHLORIDE 1 G PO TABS: 1.0000 g | ORAL_TABLET | Freq: Three times a day (TID) | ORAL | 0 refills | Status: DC

## 2023-09-17 NOTE — Progress Notes (Signed)
[facility] CONSULT NOTE  Patient Care Team: Pcp, No as PCP - General [name], RN as Oncology Nurse Navigator [name], MD as Consulting Physician (Internal Medicine)  CHIEF COMPLAINTS/PURPOSE OF CONSULTATION: Lung cancer  #  Oncology History Overview Note  IMPRESSION: 1. Patchy nodular fat stranding throughout the anterior left upper quadrant peritoneal fat, nonspecific, cannot exclude peritoneal carcinomatosis. Dedicated CT abdomen/pelvis with oral and IV contrast recommended for further evaluation. 2. Dense patchy consolidation replacing much of the left lower lung lobe, appearing masslike in the superior segment left lower lobe, with associated bulging of the left major fissure and associated left lower lobe volume loss. Fine nodularity throughout both lungs with an upper lobe predominance. Asymmetric left upper lobe interlobular septal thickening. These findings are indeterminate, with differential including multilobar pneumonia, sarcoidosis or a neoplastic process. The persistence on radiographs back to 01/20/2021 despite antibiotic therapy make sarcoidosis or a neoplastic process more likely. Pulmonology consultation suggested for consideration of bronchoscopic evaluation. 3. Small dependent left pleural effusion. 4. Mild mediastinal lymphadenopathy, nonspecific. 5. Subacute healing lateral right sixth rib fracture.   DIAGNOSIS:  A. LUNG, LEFT LOWER LOBE; ENB-ASSISTED BIOPSY:  - NON-SMALL CELL CARCINOMA, FAVOR ADENOCARCINOMA.  - FOREIGN MATERIAL SUGGESTIVE OF ASPIRATION.   # EGFR MUTATED: 37 del- June 28th, 2022- [name]. [limited]  # Asymptomatic multiple brain mets subcentimeter asymptomatic -JUNE 29th, 2023- Numerous metastatic lesions in the supratentorial brain-increase in number also conspicuity compared to January 2023.  Pre-SBRT MRI brain AUG 18th, 2023-  Eighteen small enhancing brain metastases (punctate to 11 mm); were all  present on 03/28/2021-s/p SBRT SEP 2023- [[place]]; SRS/ radiation treatment for brain metastasis He finished treatment on 11-26-22.   # SEP-OCT-worsening left-sided pleural effusion-status post paracentesis; exudative; Cytology negative- ?  Progressive disease.  # NOV 2023- JAN 2024 CARBO-TAXOL-BEV+ATEZO x4 cyles- BEV+ATEZO- until APRIL 2024  # STAGE IV- [JUNE 2022] Left lower lobe lung cancer-adenocarcinoma the lung;  EGFR- 19 del POSITIVE.  While on osimertinib- NOV 1st, 2023- PET scan- shows progressive disease in liver. Liquid Biopsy/Guardant testing-positive for EGFR mutation; otherwise Negative for any novel mutations.  Repeat liver Biopsy positive for adenocarcinoma. NEGATIVE for MET amplification.  S/p second opinion at [facility]- Dr.Stinchcomb. status post 4 cycles of carbo Taxol-Atezo+Bev- JAN 25th, 2024-PET- Interval response to therapy with resolution of previously demonstrated extensive hypermetabolic activity throughout the liver. PARIL 2024- ? Partial response, but clinically worse/rising LFTs  # MAY 6th, Brain MRI-progressive multiple brain metastases  # MAY 16th, 2024- Alimta+Avastin #1    Cancer of lower lobe of left lung (HCC)  03/23/2021 Initial Diagnosis   Cancer of lower lobe of left lung (HCC)   03/29/2021 Cancer Staging   Staging form: Lung, AJCC 8th Edition - Clinical: Stage IVB (cT3, cN3, pM1c) - Signed by [name], MD on 03/29/2021   03/30/2021 - 03/30/2021 Chemotherapy   Patient is on Treatment Plan : LUNG NSCLC Pemetrexed (Alimta) / Carboplatin q21d x 1 cycles     08/17/2022 - 01/15/2023 Chemotherapy   Patient is on Treatment Plan : LUNG Atezolizumab + Bevacizumab + Carboplatin + Paclitaxel q21d Induction x 4 cycles / Atezolizumab + Bevacizumab q21d Maintenance     02/28/2023 - 07/02/2023 Chemotherapy   Patient is on Treatment Plan : LUNG NON-SMALL CELL Bevacizumab + Pemetrexed q21d     08/07/2023 -  Chemotherapy   Patient is on Treatment Plan : LUNG NSCLC  Pemetrexed + Carboplatin + Amivantamab q21d x 4 Cycles / Pemetrexed + Amivantamab  q21d      HISTORY OF PRESENTING ILLNESS: Ambulating independently.  Accompanied by his mother.   [name] 42 y.o.  male patient with stage IV lung cancer adenocarcinoma, brain mets [synchronous] bone mets EGFR mutated, currently on carboplatin- alimta- amivantamab, s/p cycle 2, alimta added with cycle 2, on 09/06/23 who returns to clinic for follow up. In interim he went to hospital for intractable nausea and vomiting. He received IVF and IV antiemetics. Hospitalization was recommended but he preferred to be discharged to home. He has recurrent nausea. His father questions how and when he should take medication. Appetite is poor. He is hopeful to make it to [facility]. Ongoing fatigue. Has dizziness when changing position but none currently. No worsening headaches or vision changes.   Review of Systems  Constitutional:  Positive for malaise/fatigue and weight loss. Negative for chills, diaphoresis and fever.  HENT:  Negative for nosebleeds and sore throat.   Eyes:  Negative for double vision.  Respiratory:  Positive for cough. Negative for shortness of breath and wheezing.   Cardiovascular:  Negative for chest pain, palpitations, orthopnea and leg swelling.  Gastrointestinal:  Positive for nausea and vomiting. Negative for abdominal pain, blood in stool, constipation, diarrhea, heartburn and melena.  Genitourinary:  Negative for dysuria, frequency and urgency.  Musculoskeletal:  Negative for back pain, falls and joint pain.  Neurological:  Positive for dizziness. Negative for tingling, tremors, sensory change, focal weakness, weakness and headaches.  Endo/Heme/Allergies:  Does not bruise/bleed easily.  Psychiatric/Behavioral:  Positive for depression. The patient is nervous/anxious. The patient does not have insomnia.     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    SURGICAL HISTORY: Past Surgical History:  Procedure Laterality Date   FRACTURE SURGERY     broke femur when he was 6 years   PORTA CATH INSERTION N/A 03/28/2021   Procedure: PORTA CATH INSERTION;  Surgeon: Renford Dills, MD;  Location: [facility] INVASIVE CV LAB;  Service: Cardiovascular;  Laterality: N/A;   VIDEO BRONCHOSCOPY WITH ENDOBRONCHIAL NAVIGATION N/A 03/15/2021   Procedure: VIDEO BRONCHOSCOPY WITH ENDOBRONCHIAL NAVIGATION;  Surgeon: Vida Rigger, MD;  Location: [facility] ORS;  Service: Thoracic;  Laterality: N/A;   VIDEO BRONCHOSCOPY WITH ENDOBRONCHIAL ULTRASOUND N/A 03/15/2021   Procedure: VIDEO BRONCHOSCOPY WITH ENDOBRONCHIAL ULTRASOUND;  Surgeon: Vida Rigger, MD;  Location: [facility] ORS;  Service: Thoracic;  Laterality: N/A;    SOCIAL HISTORY: Social History   Socioeconomic History   Marital status: Married    Spouse name: Darl Pikes   Number of children: 2   Years of education: Not on file   Highest education level: Not on file  Occupational History   Not on file  Tobacco Use   Smoking status: Never   Smokeless tobacco: Former    Types: Snuff, Chew  Vaping Use   Vaping status: Never Used  Substance and Sexual Activity   Alcohol use: Never   Drug use: Never   Sexual activity: Yes  Other Topics Concern   Not on file  Social History Narrative   [place]; with wife; 2 daughters[12 and 22]; never smoked; rare alcohol. 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 Dog and cat.   Social Determinants of Health   Financial Resource Strain: Low Risk  (05/10/2022)   Overall Financial Resource Strain (CARDIA)    Difficulty  of Paying Living Expenses: Not hard at all  Food Insecurity: No Food Insecurity (05/10/2022)   Hunger Vital Sign    Worried About Running Out of Food in the Last Year: Never true    Ran Out of Food in the Last Year: Never true  Transportation Needs: No Transportation Needs (03/16/2022)    PRAPARE - Administrator, Civil Service (Medical): No    Lack of Transportation (Non-Medical): No  Physical Activity: Not on file  Stress: Not on file  Social Connections: Socially Integrated (05/10/2022)   Social Connection and Isolation Panel [NHANES]    Frequency of Communication with Friends and Family: More than three times a week    Frequency of Social Gatherings with Friends and Family: More than three times a week    Attends Religious Services: More than 4 times per year    Active Member of [employer] or Organizations: Yes    Attends Engineer, structural: More than 4 times per year    Marital Status: Married  Catering manager Violence: Not on file    FAMILY HISTORY: Family History  Problem Relation Age of Onset   High Cholesterol Mother    Hypertension Father    Lung cancer Father    Skin cancer Father     ALLERGIES:  is allergic to codeine.  MEDICATIONS:  Current Outpatient Medications  Medication Sig Dispense Refill   albuterol (VENTOLIN HFA) 108 (90 Base) MCG/ACT inhaler Inhale 2 puffs into the lungs every 6 (six) hours as needed for wheezing or shortness of breath. 8 g 2   calcium carbonate (TUMS EX) 750 MG chewable tablet Chew 1 tablet by mouth daily.     ELIQUIS 2.5 MG TABS tablet TAKE 1 TABLET BY MOUTH TWICE A DAY 60 tablet 4   folic acid (FOLVITE) 1 MG tablet TAKE 1 TABLET BY MOUTH EVERY DAY 90 tablet 1   lidocaine-prilocaine (EMLA) cream Apply 1 Application topically as needed. 30 g 0   methylphenidate (RITALIN) 5 MG tablet Take 1 tablet (5 mg total) by mouth 2 (two) times daily as needed (fatigue). 60 tablet 0   metoCLOPramide (REGLAN) 10 MG tablet Take 1 tablet (10 mg total) by mouth every 8 (eight) hours as needed for nausea. 30 tablet 1   metoprolol succinate (TOPROL-XL) 50 MG 24 hr tablet Take 50 mg by mouth daily. Take with or immediately following a meal.     montelukast (SINGULAIR) 10 MG tablet TAKE 1 TABLET BY MOUTH EVERYDAY AT BEDTIME 90  tablet 1   OLANZapine (ZYPREXA) 5 MG tablet Take 1 tablet (5 mg total) by mouth at bedtime as needed (nausea). 30 tablet 0   ondansetron (ZOFRAN-ODT) 4 MG disintegrating tablet Take 1 tablet (4 mg total) by mouth every 8 (eight) hours as needed for nausea or vomiting. 20 tablet 1   prochlorperazine (COMPAZINE) 10 MG tablet Take 1 tablet (10 mg total) by mouth every 6 (six) hours as needed for nausea or vomiting. 30 tablet 0   promethazine (PHENERGAN) 25 MG tablet Take 1 tablet (25 mg total) by mouth every 6 (six) hours as needed for nausea or vomiting. 30 tablet 0   sertraline (ZOLOFT) 50 MG tablet Take 1 tablet (50 mg total) by mouth daily. 30 tablet 1   triamcinolone ointment (KENALOG) 0.5 % Apply 1 Application topically 2 (two) times daily. 30 g 1   No current facility-administered medications for this visit.   Facility-Administered Medications Ordered in Other Visits  Medication Dose Route  Frequency Provider Last Rate Last Admin   heparin lock flush 100 UNIT/ML injection            heparin lock flush 100 UNIT/ML injection            heparin lock flush 100 unit/mL  500 Units Intravenous Once Borders, Ivin Booty R, NP       sodium chloride flush (NS) 0.9 % injection 10 mL  10 mL Intravenous PRN Louretta Shorten R, MD   10 mL at 07/28/21 0852   sodium chloride flush (NS) 0.9 % injection 10 mL  10 mL Intravenous Once Borders, Daryl Eastern, NP       PHYSICAL EXAMINATION: ECOG PERFORMANCE STATUS: 1 - Symptomatic but completely ambulatory  Vitals:   09/17/23 1101  BP: (!) 135/93  Pulse: (!) 110  Temp: 98.2 F (36.8 C)  SpO2: 99%   There were no vitals filed for this visit.  Physical Exam Vitals reviewed.  Constitutional:      Appearance: He is not ill-appearing.     Comments: Weight loss  HENT:     Head: Normocephalic and atraumatic.     Mouth/Throat:     Pharynx: No oropharyngeal exudate.  Eyes:     General: No scleral icterus.    Extraocular Movements: Extraocular movements  intact.     Pupils: Pupils are equal, round, and reactive to light.  Cardiovascular:     Rate and Rhythm: Normal rate and regular rhythm.  Pulmonary:     Effort: No respiratory distress.     Breath sounds: No wheezing.     Comments: Decreased breath sound on the left side compared to right. Abdominal:     General: There is no distension.     Palpations: Abdomen is soft.     Tenderness: There is no abdominal tenderness. There is no guarding.  Musculoskeletal:        General: No tenderness.  Skin:    General: Skin is warm.     Coloration: Skin is not pale.  Neurological:     Mental Status: He is alert and oriented to person, place, and time.  Psychiatric:        Mood and Affect: Mood and affect normal.        Behavior: Behavior normal.    LABORATORY DATA:  I have reviewed the data as listed Lab Results  Component Value Date   WBC 4.0 09/15/2023   HGB 10.0 (L) 09/15/2023   HCT 28.7 (L) 09/15/2023   MCV 92.9 09/15/2023   PLT 35 (L) 09/15/2023   Recent Labs    08/15/23 0821 08/22/23 0853 09/06/23 0831 09/11/23 0940 09/14/23 1618 09/15/23 0527  NA 134*   < > 127* 129* 125* 126*  K 3.5   < > 3.6 3.6 3.9 4.3  CL 101   < > 90* 96* 94* 96*  CO2 26   < > 25 25 25 23   GLUCOSE 107*   < > 128* 122* 116* 107*  BUN 11   < > 10 13 14 15   CREATININE 0.55*   < > 0.74 0.56* 0.63 0.62  CALCIUM 8.4*   < > 7.9* 7.7* 7.2* 7.4*  GFRNONAA >60   < > >60 >60 >60 >60  PROT 6.1*   < > 5.6* 5.0* 4.6*  --   ALBUMIN 2.8*   < > 2.2* 2.0* 1.7*  --   AST 140*   < > 91* 80* 67*  --   ALT 109*   < >  92* 79* 73*  --   ALKPHOS 333*   < > 349* 264* 213*  --   BILITOT 2.3*  2.3*   < > 1.6* 2.8* 2.3*  --   BILIDIR 0.9*  --   --   --   --   --   IBILI 1.4*  --   --   --   --   --    < > = values in this interval not displayed.    RADIOGRAPHIC STUDIES: I have personally reviewed the radiological images as listed and agreed with the findings in the report. DG Chest Port 1 View  Result Date:  09/09/2023 CLINICAL DATA:  Left lower lobe lung cancer. Status post left thoracentesis. EXAM: PORTABLE CHEST 1 VIEW COMPARISON:  09/06/2023 FINDINGS: Power injectable Port-A-Cath tip: Cavoatrial junction. Minimal increase in aeration in the left lung, large left pleural effusion observed. No pneumothorax or radiographic evidence of procedure complication. The right lung remains clear. Heart size within normal limits although the left apical border remains obscured. IMPRESSION: 1. Minimal increase in aeration in the left lung, large left pleural effusion observed. No pneumothorax or radiographic evidence of procedure complication. Electronically Signed   By: Gaylyn Rong M.D.   On: 09/09/2023 14:38   US THORACENTESIS ASP PLEURAL SPACE W/IMG GUIDE  Result Date: 09/09/2023 INDICATION: Patient with history of left lung cancer with recurrent left-sided pleural effusion. Request is for therapeutic thoracentesis EXAM: ULTRASOUND GUIDED THERAPEUTIC LEFT-SIDED THORACENTESIS MEDICATIONS: Lidocaine 1% 10 mL COMPLICATIONS: None immediate. PROCEDURE: An ultrasound guided thoracentesis was thoroughly discussed with the patient and questions answered. The benefits, risks, alternatives and complications were also discussed. The patient understands and wishes to proceed with the procedure. Written consent was obtained. Ultrasound was performed to localize and mark an adequate pocket of fluid in the left chest. The area was then prepped and draped in the normal sterile fashion. 1% Lidocaine was used for local anesthesia. Under ultrasound guidance a 6 Fr Safe-T-Centesis catheter was introduced. Thoracentesis was performed. The catheter was removed and a dressing applied. FINDINGS: A total of approximately 450 mL of reddish brown fluid was removed. Samples were sent to the laboratory as requested by the clinical team. IMPRESSION: Successful ultrasound guided therapeutic left-sided thoracentesis yielding 450 mL of pleural  fluid. Performed by Anders Grant NP Electronically Signed   By: Judie Petit.  Shick M.D.   On: 09/09/2023 13:58   DG Chest 2 View  Result Date: 09/06/2023 CLINICAL DATA:  Cough and shortness of breath. History of lung cancer. EXAM: CHEST - 2 VIEW COMPARISON:  Chest x-ray dated August 13, 2023. FINDINGS: Unchanged right chest wall port catheter. Stable cardiomediastinal silhouette with normal heart size. Unchanged chronic loculated large left pleural effusion with atelectasis in the lingula and left lower lobe. Slightly increased interstitial thickening in the right lung. No pneumothorax. No acute osseous abnormality. IMPRESSION: 1. Slightly increased interstitial thickening in the right lung, which may reflect atypical infection or pulmonary edema. 2. Unchanged chronic loculated large left pleural effusion. Electronically Signed   By: Obie Dredge M.D.   On: 09/06/2023 17:11   US Paracentesis  Result Date: 09/06/2023 INDICATION: malignant ascites EXAM: ULTRASOUND GUIDED  PARACENTESIS MEDICATIONS: None. COMPLICATIONS: None immediate. PROCEDURE: Informed written consent was obtained from the patient after a discussion of the risks, benefits and alternatives to treatment. A timeout was performed prior to the initiation of the procedure. Initial ultrasound scanning demonstrates a large amount of ascites within the right lower abdominal quadrant. The right lower  abdomen was prepped and draped in the usual sterile fashion. 1% lidocaine was used for local anesthesia. Following this, a 19 gauge, 7-cm, Yueh catheter was introduced. An ultrasound image was saved for documentation purposes. The paracentesis was performed. The catheter was removed and a dressing was applied. The patient tolerated the procedure well without immediate post procedural complication. FINDINGS: A total of approximately 4.8 L of clear, straw-colored fluid was removed. IMPRESSION: Successful ultrasound-guided paracentesis yielding 4.8 liters  of peritoneal fluid. Electronically Signed   By: Olive Bass M.D.   On: 09/06/2023 16:39   US Paracentesis  Result Date: 08/23/2023 INDICATION: Malignant ascites. Request received for diagnostic and therapeutic paracentesis EXAM: ULTRASOUND GUIDED  PARACENTESIS MEDICATIONS: 10 cc 1% lidocaine COMPLICATIONS: None immediate. PROCEDURE: Informed written consent was obtained from the patient after a discussion of the risks, benefits and alternatives to treatment. A timeout was performed prior to the initiation of the procedure. Initial ultrasound scanning demonstrates a large amount of ascites within the right lower abdominal quadrant. The right lower abdomen was prepped and draped in the usual sterile fashion. 1% lidocaine was used for local anesthesia. Following this, a 19 gauge, 10-cm, Yueh catheter was introduced. An ultrasound image was saved for documentation purposes. The paracentesis was performed. The catheter was removed and a dressing was applied. The patient tolerated the procedure well without immediate post procedural complication. FINDINGS: A total of approximately 4.1 L of amber fluid was removed. Samples were sent to the laboratory as requested by the clinical team. IMPRESSION: Successful ultrasound-guided paracentesis yielding 4.1 liters of peritoneal fluid. Procedure performed by Mina Marble, PA-C Electronically Signed   By: Olive Bass M.D.   On: 08/23/2023 16:43    ASSESSMENT & PLAN:   # STAGE IV- [JUNE 2022] Left lower lobe lung cancer-adenocarcinoma the lung;  EGFR- 19 del POSITIVE. s /p second opinion at [facility]- Dr.Stinchcomb.  OCT 1st, 2024- . Widespread hypermetabolic poorly marginated bilobar liver metastases, increased in size and metabolism since 04/22/2023 PET-CT; New hypermetabolic lytic posterior T3 vertebral metastasis;  New hypermetabolic subcentimeter bilateral level 2 neck lymph nodes, indeterminate for reactive versus metastatic nodes.Interlobular septal thickening  throughout the right lung is mildly increased, cannot exclude lymphangitic carcinomatosis. Stable mild patchy hypermetabolism associated with bandlike chronic consolidation in the left lower lobe, favoring inflammatory uptake. Chronic loculated large left pleural effusion with smooth left pleural thickening. Currently on Carboplatin-Alimta-Amivatimab [since Oct 2024]   # on Carboplatin-Alimta-Amivatimab. S/p cycle 2 on 09/06/23. Tolerating poorly.   # Intractable nausea & vomiting- improved post paracentesis. Reviewed use of preventative medications with treatment medications. Could consider suppository if needed but he declines.   # Thrombocytopenia- plt 23. Eliquis held. Unclear if counts have reached nadir. Plan to repeat in 2 days for possible platelet transfusion. Updated Dr Donneta Romberg who contributed to plan.   # Anemia- Hmg 10.4. Stable but overall down from baseline. Monitor.   # Neutropenia- secondary to chemotherapy. ANC 1.5. Plan to rechceck counts later this week. May need zarxio particularly in anticipation of flying if decreased counts.   # Hyponatremia- worse. Na 120 despite IVF. Question SIADH/hypoosmolality in setting of lung cancer. Start sodium chloride tablets TID. May need to consider loop diuretics but hold for now. Plan to check morning urine osmolality, urine sodium when he returns later this week. He's drinking well, prefers to go home, so we can hold off on IV Fluids today.     # Abdominal discomfort/ascites [CT scan- NOv 2024]- s/p paracentesis x2-cytology positive for adenocarcinoma-  s/p paracentesis this morning. Declined pleurex catheter.    # History of left pleural effusion worsening shortness of breath-symptoms stable.    # Rash to his face around his nose and mouth-se cto chemo- kenalog - stable.    # Asymptomatic multiple brain mets subcentimeter asymptomatic-  -s/p SBRT SEP 2023- [[place]].  S/p SBRT [[place]]- FEB 12th, 2024. July 9th, 2024-  Stable clinically.  Decreased conspicuity of all enhancing lesions with some lesions described on the prior no longer visible and others less visible. No new lesions identified  S/p evaluation with neuro-oncology- MRI Brain- OCT, 2024-clinically stable. s/p evaluation with Dr.Vaslow.    # bone metastases-Hypocalcemia: mild- continue calcium 1200 plus vitamin D-3 1000-over-the-counter-1 pill a day.  APRIL 2024-vit D 25-OH-28- on Ergocalciferol progressive disease as above.     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 On  Eliquis 2.5 mg BID [platelets- 189]. Held due to thrombocytopenia.    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    # Fatigue: s/p evaluation with [facility]- started on ritalin-    # IV access/ port malfunction-s/p tPA.     # DISPOSITION: Thursday morning- lab only (cbc, cmp, hold tube, urine osmolality, urine sodium) Friday- possible platelet transfusion Follow up with Dr Donneta Romberg as planned  No problem-specific Assessment & Plan notes found for this encounter.  All questions were answered. The patient knows to call the clinic with any problems, questions or concerns.    Alinda Dooms, NP 09/17/2023

## 2023-09-17 NOTE — Discharge Instructions (Signed)
Instructions reviewed with patient. PCS

## 2023-09-17 NOTE — Procedures (Signed)
PROCEDURE SUMMARY:  Successful US guided paracentesis from right lateral abdomen.  Yielded 5.6 liters of clear yellow fluid.  No immediate complications.  Patient tolerated well.  EBL = trace  Reana Chacko S Galaxy Borden PA-C 09/17/2023 9:08 AM

## 2023-09-18 ENCOUNTER — Encounter: Payer: Self-pay | Admitting: *Deleted

## 2023-09-19 ENCOUNTER — Encounter: Payer: Self-pay | Admitting: *Deleted

## 2023-09-19 ENCOUNTER — Inpatient Hospital Stay: Payer: 59

## 2023-09-19 ENCOUNTER — Other Ambulatory Visit: Payer: Self-pay | Admitting: *Deleted

## 2023-09-19 DIAGNOSIS — C3490 Malignant neoplasm of unspecified part of unspecified bronchus or lung: Secondary | ICD-10-CM

## 2023-09-19 DIAGNOSIS — E871 Hypo-osmolality and hyponatremia: Secondary | ICD-10-CM

## 2023-09-19 DIAGNOSIS — D696 Thrombocytopenia, unspecified: Secondary | ICD-10-CM

## 2023-09-19 DIAGNOSIS — C3432 Malignant neoplasm of lower lobe, left bronchus or lung: Secondary | ICD-10-CM | POA: Diagnosis not present

## 2023-09-19 DIAGNOSIS — Z95828 Presence of other vascular implants and grafts: Secondary | ICD-10-CM

## 2023-09-19 DIAGNOSIS — E538 Deficiency of other specified B group vitamins: Secondary | ICD-10-CM

## 2023-09-19 LAB — CMP (CANCER CENTER ONLY)
ALT: 87 U/L — ABNORMAL HIGH (ref 0–44)
AST: 75 U/L — ABNORMAL HIGH (ref 15–41)
Albumin: 1.6 g/dL — ABNORMAL LOW (ref 3.5–5.0)
Alkaline Phosphatase: 239 U/L — ABNORMAL HIGH (ref 38–126)
Anion gap: 4 — ABNORMAL LOW (ref 5–15)
BUN: 10 mg/dL (ref 6–20)
CO2: 26 mmol/L (ref 22–32)
Calcium: 7.2 mg/dL — ABNORMAL LOW (ref 8.9–10.3)
Chloride: 95 mmol/L — ABNORMAL LOW (ref 98–111)
Creatinine: 0.58 mg/dL — ABNORMAL LOW (ref 0.61–1.24)
GFR, Estimated: >60 mL/min (ref >60)
Glucose, Bld: 129 mg/dL — ABNORMAL HIGH (ref 70–99)
Potassium: 3.5 mmol/L (ref 3.5–5.1)
Sodium: 125 mmol/L — ABNORMAL LOW (ref 135–145)
Total Bilirubin: 1.2 mg/dL — ABNORMAL HIGH (ref <1.2)
Total Protein: 4.4 g/dL — ABNORMAL LOW (ref 6.5–8.1)

## 2023-09-19 LAB — CBC WITH DIFFERENTIAL (CANCER CENTER ONLY)
Abs Immature Granulocytes: 0 10*3/uL (ref 0.00–0.07)
Basophils Absolute: 0 10*3/uL (ref 0.0–0.1)
Basophils Relative: 1 %
Eosinophils Absolute: 0 10*3/uL (ref 0.0–0.5)
Eosinophils Relative: 1 %
HCT: 29.3 % — ABNORMAL LOW (ref 39.0–52.0)
Hemoglobin: 10.3 g/dL — ABNORMAL LOW (ref 13.0–17.0)
Immature Granulocytes: 0 %
Lymphocytes Relative: 48 %
Lymphs Abs: 0.9 10*3/uL (ref 0.7–4.0)
MCH: 31.7 pg (ref 26.0–34.0)
MCHC: 35.2 g/dL (ref 30.0–36.0)
MCV: 90.2 fL (ref 80.0–100.0)
Monocytes Absolute: 0.4 10*3/uL (ref 0.1–1.0)
Monocytes Relative: 19 %
Neutro Abs: 0.6 10*3/uL — ABNORMAL LOW (ref 1.7–7.7)
Neutrophils Relative %: 31 %
Platelet Count: 18 10*3/uL — ABNORMAL LOW (ref 150–400)
RBC: 3.25 MIL/uL — ABNORMAL LOW (ref 4.22–5.81)
RDW: 14.8 % (ref 11.5–15.5)
WBC Count: 1.8 10*3/uL — ABNORMAL LOW (ref 4.0–10.5)
nRBC: 0 % (ref 0.0–0.2)

## 2023-09-19 LAB — TYPE AND SCREEN
ABO/RH(D): B POS
Antibody Screen: NEGATIVE

## 2023-09-19 LAB — ABO/RH: ABO/RH(D): B POS

## 2023-09-19 MED ORDER — FILGRASTIM-SNDZ 480 MCG/0.8ML IJ SOSY
480.0000 ug | PREFILLED_SYRINGE | Freq: Every day | INTRAMUSCULAR | Status: DC
  Administered 2023-09-19: 480 ug via SUBCUTANEOUS
  Filled 2023-09-19: qty 0.8

## 2023-09-19 MED ORDER — HEPARIN SOD (PORK) LOCK FLUSH 100 UNIT/ML IV SOLN
500.0000 [IU] | Freq: Once | INTRAVENOUS | Status: AC
Start: 2023-09-19 — End: 2023-09-19
  Administered 2023-09-19: 500 [IU]
  Filled 2023-09-19: qty 5

## 2023-09-19 MED ORDER — SODIUM CHLORIDE 0.9 % IV SOLN
INTRAVENOUS | Status: DC
  Filled 2023-09-19: qty 250

## 2023-09-19 NOTE — Progress Notes (Signed)
Patient declines further fluids. V/o given from [name], NP to stop iv fluids. Proceed with zarxio today as planned. Plt transfusion tomorrow at 8am. Patient is aware. Pt d/c from clinic in stable condition.

## 2023-09-19 NOTE — Progress Notes (Signed)
0820-Per Hayley, RN pt's wife reported that patient has had increased level of thirst and has been slurring his words. I spoke with Sharia Reeve, NP. NP recommended pt to have IV fluids today. V/o to add met c to labs today. Start out at NS at 40 ml/hour until labs are reported.

## 2023-09-19 NOTE — Progress Notes (Signed)
Received message from pt's wife that pt is feeling weak this morning with increased thirst and slurring words. Pt scheduled for lab only this morning. Per Sharia Reeve, pt can be added on for fluid encounter today while waiting for labs. Pt made aware and escorted to fluid clinic after labs drawn. Pt did not need anything else at this time. Pt's wife updated.

## 2023-09-19 NOTE — Progress Notes (Signed)
0946-labs still pending. Dr. Leonard Schwartz gave v/o to have iv fluid rate increased to 100 ml/hour while waiting on labs. V/o today given by Dr. Donneta Romberg to give patient zarxio today and tomorrow given anc of 0.6. prepare plt orders entered for tomorrow. Per v/o Dr. Donneta Romberg no premeds for plt transfusion.

## 2023-09-20 ENCOUNTER — Encounter: Payer: Self-pay | Admitting: Internal Medicine

## 2023-09-20 ENCOUNTER — Inpatient Hospital Stay: Payer: 59

## 2023-09-20 VITALS — BP 114/79 | HR 108 | Temp 96.2°F | Resp 16

## 2023-09-20 DIAGNOSIS — C3490 Malignant neoplasm of unspecified part of unspecified bronchus or lung: Secondary | ICD-10-CM

## 2023-09-20 DIAGNOSIS — D696 Thrombocytopenia, unspecified: Secondary | ICD-10-CM

## 2023-09-20 DIAGNOSIS — C3432 Malignant neoplasm of lower lobe, left bronchus or lung: Secondary | ICD-10-CM | POA: Diagnosis not present

## 2023-09-20 DIAGNOSIS — E538 Deficiency of other specified B group vitamins: Secondary | ICD-10-CM

## 2023-09-20 MED ORDER — FILGRASTIM-SNDZ 480 MCG/0.8ML IJ SOSY
480.0000 ug | PREFILLED_SYRINGE | Freq: Every day | INTRAMUSCULAR | Status: DC
  Administered 2023-09-20: 480 ug via SUBCUTANEOUS
  Filled 2023-09-20: qty 0.8

## 2023-09-20 MED ORDER — SODIUM CHLORIDE 0.9% IV SOLUTION
250.0000 mL | INTRAVENOUS | Status: DC
  Administered 2023-09-20: 250 mL via INTRAVENOUS
  Filled 2023-09-20: qty 250

## 2023-09-20 NOTE — Progress Notes (Signed)
I called patient to check on him.  Feels very tired-status post platelet transfusion this morning.  Awaiting to go to [facility], appointment at [facility] with his mother.   GB

## 2023-09-21 ENCOUNTER — Other Ambulatory Visit: Payer: Self-pay

## 2023-09-21 ENCOUNTER — Emergency Department: Payer: 59

## 2023-09-21 ENCOUNTER — Encounter: Payer: Self-pay | Admitting: Radiology

## 2023-09-21 ENCOUNTER — Inpatient Hospital Stay
Admission: EM | Admit: 2023-09-21 | Discharge: 2023-09-26 | DRG: 643 | Disposition: A | Discharge status: Home Health | Payer: 59 | Attending: Student | Admitting: Student

## 2023-09-21 DIAGNOSIS — C787 Secondary malignant neoplasm of liver and intrahepatic bile duct: Secondary | ICD-10-CM | POA: Diagnosis present

## 2023-09-21 DIAGNOSIS — E876 Hypokalemia: Secondary | ICD-10-CM | POA: Diagnosis present

## 2023-09-21 DIAGNOSIS — R55 Syncope and collapse: Secondary | ICD-10-CM | POA: Diagnosis not present

## 2023-09-21 DIAGNOSIS — Z515 Encounter for palliative care: Secondary | ICD-10-CM

## 2023-09-21 DIAGNOSIS — Z808 Family history of malignant neoplasm of other organs or systems: Secondary | ICD-10-CM

## 2023-09-21 DIAGNOSIS — C3492 Malignant neoplasm of unspecified part of left bronchus or lung: Secondary | ICD-10-CM | POA: Diagnosis present

## 2023-09-21 DIAGNOSIS — Z66 Do not resuscitate: Secondary | ICD-10-CM | POA: Diagnosis present

## 2023-09-21 DIAGNOSIS — Z83438 Family history of other disorder of lipoprotein metabolism and other lipidemia: Secondary | ICD-10-CM

## 2023-09-21 DIAGNOSIS — R188 Other ascites: Secondary | ICD-10-CM | POA: Diagnosis present

## 2023-09-21 DIAGNOSIS — K7460 Unspecified cirrhosis of liver: Secondary | ICD-10-CM | POA: Diagnosis present

## 2023-09-21 DIAGNOSIS — T451X5A Adverse effect of antineoplastic and immunosuppressive drugs, initial encounter: Secondary | ICD-10-CM | POA: Diagnosis present

## 2023-09-21 DIAGNOSIS — E222 Syndrome of inappropriate secretion of antidiuretic hormone: Principal | ICD-10-CM | POA: Diagnosis present

## 2023-09-21 DIAGNOSIS — D638 Anemia in other chronic diseases classified elsewhere: Secondary | ICD-10-CM | POA: Diagnosis present

## 2023-09-21 DIAGNOSIS — J910 Malignant pleural effusion: Secondary | ICD-10-CM | POA: Diagnosis present

## 2023-09-21 DIAGNOSIS — D6481 Anemia due to antineoplastic chemotherapy: Secondary | ICD-10-CM | POA: Diagnosis present

## 2023-09-21 DIAGNOSIS — E871 Hypo-osmolality and hyponatremia: Secondary | ICD-10-CM | POA: Diagnosis not present

## 2023-09-21 DIAGNOSIS — J9601 Acute respiratory failure with hypoxia: Secondary | ICD-10-CM | POA: Diagnosis present

## 2023-09-21 DIAGNOSIS — N179 Acute kidney failure, unspecified: Secondary | ICD-10-CM | POA: Diagnosis not present

## 2023-09-21 DIAGNOSIS — Z87891 Personal history of nicotine dependence: Secondary | ICD-10-CM

## 2023-09-21 DIAGNOSIS — Z7189 Other specified counseling: Secondary | ICD-10-CM

## 2023-09-21 DIAGNOSIS — E872 Acidosis, unspecified: Secondary | ICD-10-CM | POA: Diagnosis not present

## 2023-09-21 DIAGNOSIS — Z8249 Family history of ischemic heart disease and other diseases of the circulatory system: Secondary | ICD-10-CM

## 2023-09-21 DIAGNOSIS — T43225A Adverse effect of selective serotonin reuptake inhibitors, initial encounter: Secondary | ICD-10-CM | POA: Diagnosis present

## 2023-09-21 DIAGNOSIS — J440 Chronic obstructive pulmonary disease with acute lower respiratory infection: Secondary | ICD-10-CM | POA: Diagnosis present

## 2023-09-21 DIAGNOSIS — C7931 Secondary malignant neoplasm of brain: Secondary | ICD-10-CM | POA: Diagnosis present

## 2023-09-21 DIAGNOSIS — J90 Pleural effusion, not elsewhere classified: Secondary | ICD-10-CM

## 2023-09-21 DIAGNOSIS — J189 Pneumonia, unspecified organism: Secondary | ICD-10-CM | POA: Diagnosis present

## 2023-09-21 DIAGNOSIS — I456 Pre-excitation syndrome: Secondary | ICD-10-CM

## 2023-09-21 DIAGNOSIS — Z79899 Other long term (current) drug therapy: Secondary | ICD-10-CM

## 2023-09-21 DIAGNOSIS — J441 Chronic obstructive pulmonary disease with (acute) exacerbation: Secondary | ICD-10-CM | POA: Diagnosis present

## 2023-09-21 DIAGNOSIS — R180 Malignant ascites: Secondary | ICD-10-CM

## 2023-09-21 DIAGNOSIS — F32A Depression, unspecified: Secondary | ICD-10-CM | POA: Diagnosis present

## 2023-09-21 DIAGNOSIS — E861 Hypovolemia: Secondary | ICD-10-CM | POA: Diagnosis present

## 2023-09-21 DIAGNOSIS — D6959 Other secondary thrombocytopenia: Secondary | ICD-10-CM | POA: Diagnosis present

## 2023-09-21 DIAGNOSIS — E8770 Fluid overload, unspecified: Principal | ICD-10-CM | POA: Diagnosis present

## 2023-09-21 DIAGNOSIS — I471 Supraventricular tachycardia, unspecified: Secondary | ICD-10-CM | POA: Diagnosis not present

## 2023-09-21 DIAGNOSIS — Y92009 Unspecified place in unspecified non-institutional (private) residence as the place of occurrence of the external cause: Secondary | ICD-10-CM

## 2023-09-21 DIAGNOSIS — I951 Orthostatic hypotension: Secondary | ICD-10-CM | POA: Diagnosis present

## 2023-09-21 DIAGNOSIS — D630 Anemia in neoplastic disease: Secondary | ICD-10-CM | POA: Diagnosis present

## 2023-09-21 DIAGNOSIS — E860 Dehydration: Secondary | ICD-10-CM | POA: Diagnosis present

## 2023-09-21 DIAGNOSIS — Z7901 Long term (current) use of anticoagulants: Secondary | ICD-10-CM

## 2023-09-21 DIAGNOSIS — C7951 Secondary malignant neoplasm of bone: Secondary | ICD-10-CM | POA: Diagnosis present

## 2023-09-21 DIAGNOSIS — Z801 Family history of malignant neoplasm of trachea, bronchus and lung: Secondary | ICD-10-CM

## 2023-09-21 DIAGNOSIS — D684 Acquired coagulation factor deficiency: Secondary | ICD-10-CM | POA: Diagnosis present

## 2023-09-21 DIAGNOSIS — C782 Secondary malignant neoplasm of pleura: Secondary | ICD-10-CM | POA: Diagnosis present

## 2023-09-21 DIAGNOSIS — Z885 Allergy status to narcotic agent status: Secondary | ICD-10-CM

## 2023-09-21 DIAGNOSIS — R112 Nausea with vomiting, unspecified: Secondary | ICD-10-CM

## 2023-09-21 DIAGNOSIS — T43595A Adverse effect of other antipsychotics and neuroleptics, initial encounter: Secondary | ICD-10-CM | POA: Diagnosis present

## 2023-09-21 LAB — BPAM PLATELET PHERESIS
Blood Product Expiration Date: 202412072359
ISSUE DATE / TIME: 202412060845
Unit Type and Rh: 5100

## 2023-09-21 LAB — COMPREHENSIVE METABOLIC PANEL
ALT: 100 U/L — ABNORMAL HIGH (ref 0–44)
AST: 75 U/L — ABNORMAL HIGH (ref 15–41)
Albumin: 1.9 g/dL — ABNORMAL LOW (ref 3.5–5.0)
Alkaline Phosphatase: 315 U/L — ABNORMAL HIGH (ref 38–126)
Anion gap: 14 (ref 5–15)
BUN: 11 mg/dL (ref 6–20)
CO2: 18 mmol/L — ABNORMAL LOW (ref 22–32)
Calcium: 7.6 mg/dL — ABNORMAL LOW (ref 8.9–10.3)
Chloride: 90 mmol/L — ABNORMAL LOW (ref 98–111)
Creatinine, Ser: 1 mg/dL (ref 0.61–1.24)
GFR, Estimated: >60 mL/min (ref >60)
Glucose, Bld: 147 mg/dL — ABNORMAL HIGH (ref 70–99)
Potassium: 4.2 mmol/L (ref 3.5–5.1)
Sodium: 122 mmol/L — ABNORMAL LOW (ref 135–145)
Total Bilirubin: 2.1 mg/dL — ABNORMAL HIGH (ref <1.2)
Total Protein: 5 g/dL — ABNORMAL LOW (ref 6.5–8.1)

## 2023-09-21 LAB — URINALYSIS, W/ REFLEX TO CULTURE (INFECTION SUSPECTED)
Bacteria, UA: NONE SEEN
Bilirubin Urine: NEGATIVE
Glucose, UA: NEGATIVE mg/dL
Hgb urine dipstick: NEGATIVE
Ketones, ur: NEGATIVE mg/dL
Leukocytes,Ua: NEGATIVE
Nitrite: NEGATIVE
Protein, ur: NEGATIVE mg/dL
Specific Gravity, Urine: 1.017 (ref 1.005–1.030)
pH: 5 (ref 5.0–8.0)

## 2023-09-21 LAB — PREPARE PLATELET PHERESIS: Unit division: 0

## 2023-09-21 LAB — CBC WITH DIFFERENTIAL/PLATELET
Abs Immature Granulocytes: 0.07 10*3/uL (ref 0.00–0.07)
Basophils Absolute: 0 10*3/uL (ref 0.0–0.1)
Basophils Relative: 1 %
Eosinophils Absolute: 0 10*3/uL (ref 0.0–0.5)
Eosinophils Relative: 0 %
HCT: 34.8 % — ABNORMAL LOW (ref 39.0–52.0)
Hemoglobin: 12.2 g/dL — ABNORMAL LOW (ref 13.0–17.0)
Immature Granulocytes: 1 %
Lymphocytes Relative: 14 %
Lymphs Abs: 1 10*3/uL (ref 0.7–4.0)
MCH: 32.5 pg (ref 26.0–34.0)
MCHC: 35.1 g/dL (ref 30.0–36.0)
MCV: 92.8 fL (ref 80.0–100.0)
Monocytes Absolute: 1.6 10*3/uL — ABNORMAL HIGH (ref 0.1–1.0)
Monocytes Relative: 21 %
Neutro Abs: 4.7 10*3/uL (ref 1.7–7.7)
Neutrophils Relative %: 63 %
Platelets: 59 10*3/uL — ABNORMAL LOW (ref 150–400)
RBC: 3.75 MIL/uL — ABNORMAL LOW (ref 4.22–5.81)
RDW: 16.7 % — ABNORMAL HIGH (ref 11.5–15.5)
Smear Review: NORMAL
WBC: 7.4 10*3/uL (ref 4.0–10.5)
nRBC: 0.8 % — ABNORMAL HIGH (ref 0.0–0.2)

## 2023-09-21 LAB — BASIC METABOLIC PANEL
Anion gap: 6 (ref 5–15)
BUN: 11 mg/dL (ref 6–20)
CO2: 24 mmol/L (ref 22–32)
Calcium: 7 mg/dL — ABNORMAL LOW (ref 8.9–10.3)
Chloride: 92 mmol/L — ABNORMAL LOW (ref 98–111)
Creatinine, Ser: 0.84 mg/dL (ref 0.61–1.24)
GFR, Estimated: >60 mL/min (ref >60)
Glucose, Bld: 104 mg/dL — ABNORMAL HIGH (ref 70–99)
Potassium: 3.7 mmol/L (ref 3.5–5.1)
Sodium: 122 mmol/L — ABNORMAL LOW (ref 135–145)

## 2023-09-21 LAB — PROTIME-INR
INR: 1.8 — ABNORMAL HIGH (ref 0.8–1.2)
Prothrombin Time: 20.9 s — ABNORMAL HIGH (ref 11.4–15.2)

## 2023-09-21 LAB — TROPONIN I (HIGH SENSITIVITY): Troponin I (High Sensitivity): 21 ng/L — ABNORMAL HIGH (ref <18)

## 2023-09-21 LAB — MAGNESIUM: Magnesium: 1.3 mg/dL — ABNORMAL LOW (ref 1.7–2.4)

## 2023-09-21 LAB — LACTIC ACID, PLASMA
Lactic Acid, Venous: 6 mmol/L (ref 0.5–1.9)
Lactic Acid, Venous: 4.4 mmol/L (ref 0.5–1.9)
Lactic Acid, Venous: 1.6 mmol/L (ref 0.5–1.9)

## 2023-09-21 MED ORDER — ALBUMIN HUMAN 5 % IV SOLN
12.5000 g | Freq: Four times a day (QID) | INTRAVENOUS | Status: AC
  Administered 2023-09-21 (×2): 12.5 g via INTRAVENOUS
  Filled 2023-09-21 (×3): qty 250

## 2023-09-21 MED ORDER — ALBUTEROL SULFATE (2.5 MG/3ML) 0.083% IN NEBU
2.5000 mg | INHALATION_SOLUTION | RESPIRATORY_TRACT | Status: DC | PRN
  Administered 2023-09-21: 2.5 mg via RESPIRATORY_TRACT
  Filled 2023-09-21: qty 3

## 2023-09-21 MED ORDER — SERTRALINE HCL 50 MG PO TABS
50.0000 mg | ORAL_TABLET | Freq: Every day | ORAL | Status: DC
  Administered 2023-09-21 – 2023-09-22 (×2): 50 mg via ORAL
  Filled 2023-09-21 (×3): qty 1

## 2023-09-21 MED ORDER — ONDANSETRON HCL 4 MG PO TABS
4.0000 mg | ORAL_TABLET | Freq: Four times a day (QID) | ORAL | Status: DC | PRN
  Administered 2023-09-25: 4 mg via ORAL

## 2023-09-21 MED ORDER — ACETAMINOPHEN 325 MG PO TABS: 650.0000 mg | ORAL_TABLET | Freq: Four times a day (QID) | ORAL | Status: DC | PRN

## 2023-09-21 MED ORDER — SODIUM CHLORIDE 0.9% FLUSH
3.0000 mL | Freq: Two times a day (BID) | INTRAVENOUS | Status: DC
  Administered 2023-09-21 – 2023-09-26 (×9): 3 mL via INTRAVENOUS

## 2023-09-21 MED ORDER — ACETAMINOPHEN 650 MG RE SUPP: 650.0000 mg | Freq: Four times a day (QID) | RECTAL | Status: DC | PRN

## 2023-09-21 MED ORDER — ONDANSETRON HCL 4 MG/2ML IJ SOLN
4.0000 mg | Freq: Once | INTRAMUSCULAR | Status: AC
  Administered 2023-09-21: 4 mg via INTRAVENOUS
  Filled 2023-09-21: qty 2

## 2023-09-21 MED ORDER — PIPERACILLIN-TAZOBACTAM 3.375 G IVPB 30 MIN
3.3750 g | Freq: Once | INTRAVENOUS | Status: DC
  Filled 2023-09-21: qty 50

## 2023-09-21 MED ORDER — FOLIC ACID 1 MG PO TABS
1.0000 mg | ORAL_TABLET | Freq: Every day | ORAL | Status: DC
Start: 2023-09-21 — End: 2023-09-26
  Administered 2023-09-21 – 2023-09-25 (×5): 1 mg via ORAL
  Filled 2023-09-21 (×6): qty 1

## 2023-09-21 MED ORDER — OLANZAPINE 5 MG PO TABS
5.0000 mg | ORAL_TABLET | Freq: Every evening | ORAL | Status: DC | PRN
  Administered 2023-09-21: 5 mg via ORAL
  Filled 2023-09-21: qty 1

## 2023-09-21 MED ORDER — ONDANSETRON HCL 4 MG/2ML IJ SOLN
4.0000 mg | Freq: Four times a day (QID) | INTRAMUSCULAR | Status: DC | PRN
  Administered 2023-09-23 (×3): 4 mg via INTRAVENOUS
  Filled 2023-09-21 (×5): qty 2

## 2023-09-21 MED ORDER — SODIUM CHLORIDE 0.9 % IV BOLUS
1000.0000 mL | Freq: Once | INTRAVENOUS | Status: AC
  Administered 2023-09-21: 1000 mL via INTRAVENOUS

## 2023-09-21 MED ORDER — METRONIDAZOLE 500 MG/100ML IV SOLN
500.0000 mg | Freq: Once | INTRAVENOUS | Status: AC
  Administered 2023-09-21: 500 mg via INTRAVENOUS
  Filled 2023-09-21: qty 100

## 2023-09-21 MED ORDER — SODIUM CHLORIDE 0.9 % IV SOLN
2.0000 g | Freq: Once | INTRAVENOUS | Status: AC
  Administered 2023-09-21: 2 g via INTRAVENOUS
  Filled 2023-09-21: qty 12.5

## 2023-09-21 MED ORDER — VANCOMYCIN HCL 2000 MG/400ML IV SOLN
2000.0000 mg | Freq: Once | INTRAVENOUS | Status: AC
  Administered 2023-09-21: 2000 mg via INTRAVENOUS
  Filled 2023-09-21: qty 400

## 2023-09-21 MED ORDER — POLYETHYLENE GLYCOL 3350 17 G PO PACK: 17.0000 g | PACK | Freq: Every day | ORAL | Status: DC | PRN

## 2023-09-21 MED ORDER — SODIUM CHLORIDE 1 G PO TABS
1.0000 g | ORAL_TABLET | Freq: Three times a day (TID) | ORAL | Status: DC
  Administered 2023-09-21 – 2023-09-22 (×2): 1 g via ORAL
  Filled 2023-09-21 (×2): qty 1

## 2023-09-21 NOTE — Assessment & Plan Note (Signed)
Markedly elevated lactic acid at 6.0 likely secondary to dehydration.  No symptoms to suggest underlying infection.  WBCs elevated compared to prior as patient received the Zarxio yesterday.    - Monitor closely for development of fever - Urinalysis pending - Blood cultures pending - S/p cefepime, Flagyl and vancomycin; hold further antimicrobials for now

## 2023-09-21 NOTE — ED Triage Notes (Signed)
Pt states he has lung and liver CA. Pt has been vomiting for the past 3 weeks and was recently admitted for the same. He has received platelets at the cancer center. Pt has been weak and was at [employer] and his wife caught him and lowered him to the floor. Pt doesn't think he passed out but wife verifies that he did.

## 2023-09-21 NOTE — Assessment & Plan Note (Signed)
Discussed goals of care with patient today.  He states he would not want to receive CPR as he never want to be in a position where he has to rely on others to take care of him.  He does know that he would want intubation in the case of pulmonary arrest.  I discussed with patient that he has a high risk of never being able to wean off of ventilator becoming trach dependent.  He noted that he has heard of plenty of people being able to wean.  I explained that due to his history of lung cancer with rapidly accumulated loculated pleural effusions requiring repeated thoracentesis, in addition to multiorgan metastatic disease, he would be unlikely to wean off ventilatory support.  He expressed understanding but would like to remain as DNR only, with intubation in the case of pulmonary arrest.

## 2023-09-21 NOTE — H&P (Signed)
History and Physical    Patient: [name]:[id number] DOB: Feb 18, 1981 DOA: 09/21/2023 DOS: the patient was seen and examined on 09/21/2023 PCP: Pcp, No  Patient coming from: Home  Chief Complaint:  Chief Complaint  Patient presents with   Loss of Consciousness   HPI: [name] is a 42 y.o. male with medical history significant of stage IV lung adenocarcinoma with brain, liver, bone metastasis on Carboplatin-Alimta-Amivatimab, associated thrombocytopenia, Wolff-Parkinson-White syndrome on metoprolol, who presents to the ED due to syncope.  [name] states that he continues to experience daily nausea with multiple episode of vomiting.  Then today, he went to lunch with his family and on the way back he noted that he was very dyspneic on exertion.  He got into his home and was able to sit down and thought he might feel a bit better.  He then got up and took a few steps with sudden loss of consciousness.  His father at bedside states it seems like his legs first gave out and then he fell.  His wife and his father were able to catch him to make sure he did not lose consciousness.  He regained consciousness within a few seconds and was back to baseline with no disorientation or altered mental status.  They deny any seizure activity.  At this time, [name] endorses nausea, shortness of breath, wheezing, lower extremity swelling, and notes that his abdominal distention has returned despite paracentesis recently.  ED course: Upon arrival to the ED, patient was normotensive at 118/83 with heart rate of 130.  He was saturating at 99% on 2 L.  He was afebrile 97.6. Initial workup notable for WBC of 7.4, hemoglobin of 12.2, platelets 59, sodium 122, bicarb 18, glucose 147, BUN 11, creatinine 1.0, alkaline phosphatase 315, albumin 1.9, AST 75, ALT 100, and GFR above 60.  Lactic acid 6.0.  Troponin 21.  Chest x-ray was obtained with slight increase in large loculated left pleural effusion.   Patient started on IV fluids, vancomycin, cefepime and Flagyl.  TRH contacted for admission.  Review of Systems: As mentioned in the history of present illness. All other systems reviewed and are negative.  Past Medical History:  Diagnosis Date   Cancer (HCC)    Dyspnea    Heartburn    Pneumonia    Wolff-Parkinson-White (WPW) 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   Past Surgical History:  Procedure Laterality Date   FRACTURE SURGERY     broke femur when he was 6 years   PORTA CATH INSERTION N/A 03/28/2021   Procedure: PORTA CATH INSERTION;  Surgeon: Renford Dills, MD;  Location: [facility] INVASIVE CV LAB;  Service: Cardiovascular;  Laterality: N/A;   VIDEO BRONCHOSCOPY WITH ENDOBRONCHIAL NAVIGATION N/A 03/15/2021   Procedure: VIDEO BRONCHOSCOPY WITH ENDOBRONCHIAL NAVIGATION;  Surgeon: Vida Rigger, MD;  Location: [facility] ORS;  Service: Thoracic;  Laterality: N/A;   VIDEO BRONCHOSCOPY WITH ENDOBRONCHIAL ULTRASOUND N/A 03/15/2021   Procedure: VIDEO BRONCHOSCOPY WITH ENDOBRONCHIAL ULTRASOUND;  Surgeon: Vida Rigger, MD;  Location: [facility] ORS;  Service: Thoracic;  Laterality: N/A;   Social History:  reports that he has never smoked. He has quit using smokeless tobacco.  His smokeless tobacco use included snuff and chew. He reports that he does not drink alcohol and does not use drugs.  Allergies  Allergen Reactions   Codeine     Makes pt hyper    Family History  Problem Relation Age of Onset   High Cholesterol Mother  Hypertension Father    Lung cancer Father    Skin cancer Father     Prior to Admission medications   Medication Sig Start Date End Date Taking? Authorizing Provider  albuterol (VENTOLIN HFA) 108 (90 Base) MCG/ACT inhaler Inhale 2 puffs into the lungs every 6 (six) hours as needed for wheezing or shortness of breath. 02/28/23   [name], MD  calcium carbonate (TUMS EX) 750 MG chewable tablet Chew 1 tablet by mouth daily.    [provider]  ELIQUIS 2.5 MG  TABS tablet TAKE 1 TABLET BY MOUTH TWICE A DAY 09/02/23   [name], MD  folic acid (FOLVITE) 1 MG tablet TAKE 1 TABLET BY MOUTH EVERY DAY 04/17/23   Alinda Dooms, NP  lidocaine-prilocaine (EMLA) cream Apply 1 Application topically as needed. 10/11/22   Rushie Chestnut, PA-C  methylphenidate (RITALIN) 5 MG tablet Take 1 tablet (5 mg total) by mouth 2 (two) times daily as needed (fatigue). 08/28/23   Borders, Daryl Eastern, NP  metoCLOPramide (REGLAN) 10 MG tablet Take 1 tablet (10 mg total) by mouth every 8 (eight) hours as needed for nausea. 09/15/23   Tresa Moore, MD  metoprolol succinate (TOPROL-XL) 50 MG 24 hr tablet Take 50 mg by mouth daily. Take with or immediately following a meal.    [provider]  montelukast (SINGULAIR) 10 MG tablet TAKE 1 TABLET BY MOUTH EVERYDAY AT BEDTIME 07/26/23   [name], MD  OLANZapine (ZYPREXA) 5 MG tablet Take 1 tablet (5 mg total) by mouth at bedtime as needed (nausea). 09/02/23   Borders, Daryl Eastern, NP  ondansetron (ZOFRAN-ODT) 4 MG disintegrating tablet Take 1 tablet (4 mg total) by mouth every 8 (eight) hours as needed for nausea or vomiting. 06/20/23   Borders, Daryl Eastern, NP  prochlorperazine (COMPAZINE) 10 MG tablet Take 1 tablet (10 mg total) by mouth every 6 (six) hours as needed for nausea or vomiting. 09/15/23   Tresa Moore, MD  promethazine (PHENERGAN) 25 MG tablet Take 1 tablet (25 mg total) by mouth every 6 (six) hours as needed for nausea or vomiting. 09/14/23   Rickard Patience, MD  sertraline (ZOLOFT) 50 MG tablet Take 1 tablet (50 mg total) by mouth daily. 08/09/23   Borders, Daryl Eastern, NP  sodium chloride 1 g tablet Take 1 tablet (1 g total) by mouth 3 (three) times daily. For low sodium levels 09/17/23   Alinda Dooms, NP  triamcinolone ointment (KENALOG) 0.5 % Apply 1 Application topically 2 (two) times daily. 08/22/23   [name], MD    Physical Exam: Vitals:   09/21/23 1340 09/21/23 1349  09/21/23 1500 09/21/23 1515  BP:   112/88 111/85  Pulse:   (!) 113 (!) 117  Resp:   19 17  Temp:  97.6 F (36.4 C)    TempSrc:  Axillary    SpO2:   100% 100%  Weight: 86.2 kg     Height: 5\' 10"  (1.778 m)      Physical Exam Vitals and nursing note reviewed.  Constitutional:      Appearance: He is normal weight. He is ill-appearing (Chronically).  HENT:     Head: Normocephalic and atraumatic.     Comments: Oropharynx clear with no evidence of edema.    Mouth/Throat:     Mouth: Mucous membranes are dry.  Eyes:     Conjunctiva/sclera: Conjunctivae normal.     Pupils: Pupils are equal, round, and reactive to  light.  Cardiovascular:     Rate and Rhythm: Regular rhythm. Tachycardia present.     Heart sounds: No murmur heard.    No gallop.  Pulmonary:     Effort: Pulmonary effort is normal. Tachypnea present. No accessory muscle usage or respiratory distress.     Breath sounds: Examination of the left-upper field reveals decreased breath sounds. Examination of the left-middle field reveals decreased breath sounds. Decreased breath sounds present. No wheezing, rhonchi or rales.     Comments: Upper airway wheezing.  No wheezing on lung exam exam Musculoskeletal:     Comments: +1 pitting edema bilaterally  Skin:    General: Skin is warm and dry.     Coloration: Skin is pale.     Comments: Bilateral cheek flushing with associated telangiectasias  Neurological:     Mental Status: He is alert and oriented to person, place, and time. Mental status is at baseline.  Psychiatric:        Mood and Affect: Mood normal.        Behavior: Behavior normal.    Data Reviewed: CBC with WBC of 7.4, hemoglobin of 12.2, platelets of 59 CMP with sodium of 122, potassium 4.2, bicarb 18, anion gap 14, BUN 11, creatinine 1, D3 15, AST 75, ALT 100, albumin 1.9 and GFR above 60 Troponin 21 Lactic acid 6.0  EKG personally reviewed sinus rhythm with rate of 120.  T wave inversion in leads V1 through V4.   Delta wave noted in 1, V1 through V3.  Compared to EKG obtained on 11/30, T wave inversions are not new, neither is the delta wave.  Rate only minimally increased, as previously was 111.  DG Chest 2 View  Result Date: 09/21/2023 CLINICAL DATA:  Suspected sepsis. Lung and liver cancer per patient. Vomiting. EXAM: CHEST - 2 VIEW COMPARISON:  Chest radiographs 09/09/2023 and 09/06/2023. Chest CT 07/16/2022. FINDINGS: Right IJ Port-A-Cath extends to the level of the superior cavoatrial junction, stable. The visualized heart size and mediastinal contours are stable. A large laterally loculated left pleural effusion appears slightly increased compared with the most recent prior study. Associated chronic compressive left lung atelectasis. No evidence of right-sided pleural effusion or pneumothorax. Mild right basilar atelectasis, unchanged. The bones appear unchanged. IMPRESSION: Slight increase in size of a large laterally loculated left pleural effusion with associated chronic compressive left lung atelectasis. No other significant changes. Electronically Signed   By: Carey Bullocks M.D.   On: 09/21/2023 14:19    Results are pending, will review when available.  Assessment and Plan:  * Syncope Patient is presenting with a syncopal episode shortly after standing consistent with orthostatic hypotension, brought on by persistent intractable nausea/vomiting and associated AKI in the setting of chemotherapy.  - S/p 2 L bolus - Given cirrhosis and peripheral edema, start albumin 12.5 g every 6 hours for 3 doses - Push oral fluid rehydration - Zofran as needed for nausea  Lactic acidosis Markedly elevated lactic acid at 6.0 likely secondary to dehydration.  No symptoms to suggest underlying infection.  WBCs elevated compared to prior as patient received the Zarxio yesterday.    - Monitor closely for development of fever - Urinalysis pending - Blood cultures pending - S/p cefepime, Flagyl and vancomycin;  hold further antimicrobials for now  AKI (acute kidney injury) (HCC) In the setting of dehydration.  - IV fluids as ordered - Daily BMP - Hold nephrotoxic agents  Hyponatremia Hypovolemic hyponatremia in the setting of dehydration, although history of  malignant ascites and cirrhosis is likely contributing as well  - S/p 2 L bolus - Albumin as ordered - BMP every 8 hours  Loculated pleural effusion Malignant pleural effusion with recent thoracentesis on 10/09/2023 with evidence of reaccumulation on chest x-ray today.  No evidence of desaturations, however patient states he feels much better with oxygen on  - Ultrasound-guided thoracentesis ordered - Continue supplemental oxygen for comfort per patient request  Ascites Recent paracentesis on 12/3 with reaccumulation of ascites.  - Ultrasound-guided paracentesis ordered  Intractable nausea and vomiting In the setting of metastatic adenocarcinoma and associated chemotherapy.  - Zofran as needed - If no relief, could try Phenergan versus Reglan  WPW (Wolff-Parkinson-White syndrome) Long-term history of Wolff-Parkinson-White syndrome on metoprolol.  Syncopal episode consistent with orthostasis, however will monitor on telemetry overnight  - Telemetry monitoring - Continue home metoprolol  Adenocarcinoma of lung, stage 4, left (HCC) History of stage IV adenocarcinoma of the left lung with metastasis to the pleural space, liver, brain, bone.  Currently undergoing chemotherapy and is hoping to get a second opinion at [facility]  Goals of care, counseling/discussion Discussed goals of care with patient today.  He states he would not want to receive CPR as he never want to be in a position where he has to rely on others to take care of him.  He does know that he would want intubation in the case of pulmonary arrest.  I discussed with patient that he has a high risk of never being able to wean off of ventilator becoming trach  dependent.  He noted that he has heard of plenty of people being able to wean.  I explained that due to his history of lung cancer with rapidly accumulated loculated pleural effusions requiring repeated thoracentesis, in addition to multiorgan metastatic disease, he would be unlikely to wean off ventilatory support.  He expressed understanding but would like to remain as DNR only, with intubation in the case of pulmonary arrest.  Advance Care Planning:   Code Status: Do not attempt resuscitation (DNR) PRE-ARREST INTERVENTIONS DESIRED   Consults: None  Family Communication: Patient's wife and father updated at bedside  Severity of Illness: The appropriate patient status for this patient is OBSERVATION. Observation status is judged to be reasonable and necessary in order to provide the required intensity of service to ensure the patient's safety. The patient's presenting symptoms, physical exam findings, and initial radiographic and laboratory data in the context of their medical condition is felt to place them at decreased risk for further clinical deterioration. Furthermore, it is anticipated that the patient will be medically stable for discharge from the hospital within 2 midnights of admission.   Author: Verdene Lennert, MD 09/21/2023 4:55 PM  For on call review [email or web address].

## 2023-09-21 NOTE — Assessment & Plan Note (Signed)
In the setting of dehydration.  - IV fluids as ordered - Daily BMP - Hold nephrotoxic agents

## 2023-09-21 NOTE — ED Notes (Signed)
 Medications due are not yet verified by pharmacy.

## 2023-09-21 NOTE — Assessment & Plan Note (Addendum)
Malignant pleural effusion with recent thoracentesis on 10/09/2023 with evidence of reaccumulation on chest x-ray today.  No evidence of desaturations, however patient states he feels much better with oxygen on  - Ultrasound-guided thoracentesis ordered - Continue supplemental oxygen for comfort per patient request

## 2023-09-21 NOTE — ED Notes (Signed)
Sent med message for albumin.

## 2023-09-21 NOTE — Assessment & Plan Note (Signed)
Long-term history of Wolff-Parkinson-White syndrome on metoprolol.  Syncopal episode consistent with orthostasis, however will monitor on telemetry overnight  - Telemetry monitoring - Continue home metoprolol

## 2023-09-21 NOTE — ED Triage Notes (Signed)
Pt in via EMS from home with c/o syncopal episode. Pt has lung cancer that has mets to several places. 95/70, 109/54, 99% RA, HR 130, CBG 147

## 2023-09-21 NOTE — ED Provider Notes (Signed)
[facility] Provider Note    Event Date/Time   First MD Initiated Contact with Patient 09/21/23 1428     (approximate)   History   Loss of Consciousness   HPI  [name] is a 42 y.o. male   Past medical history of metastatic lung cancer on chemotherapy, WPW, with intractable nausea and vomiting leading to hospitalization found to have hyponatremia down to a low of 120 recently and just discharged on 09/15/2023 on antiemetic therapy presents with ongoing nausea vomiting, poor p.o. intake and an episode of syncope today.  The antiemetics he has been prescribed have not been working.  He came back from a lunch date with his father and upon standing got lightheaded and passed out.  He experienced no palpitations chest pain or shortness of breath during that time.  His family was able to lower to the ground gently and he sustained no trauma.  Currently he feels fatigued, generalized weakness.  He denies any focal pain, denies chest pain shortness of breath cough, fever, chills, congestion, abdominal pain or diarrhea.  He has no urinary symptoms.  He is disappointed because he was going to travel to [place] tomorrow to meet with oncology for second opinion on treatment options  Independent Historian contributed to assessment above: His wife is at bedside to corroborate information past medical history as above  External Medical Documents Reviewed: Discharge summary from 09/15/2023 when he was admitted for intractable nausea and vomiting found to be dehydrated and hyponatremic      Physical Exam   Triage Vital Signs: ED Triage Vitals  Encounter Vitals Group     BP 09/21/23 1339 118/83     Systolic BP Percentile --      Diastolic BP Percentile --      Pulse Rate 09/21/23 1339 (!) 130     Resp 09/21/23 1339 (!) 22     Temp 09/21/23 1349 97.6 F (36.4 C)     Temp Source 09/21/23 1349 Axillary     SpO2 09/21/23 1339 99 %     Weight 09/21/23 1340 190 lb  (86.2 kg)     Height 09/21/23 1340 5\' 10"  (1.778 m)     Head Circumference --      Peak Flow --      Pain Score 09/21/23 1502 0     Pain Loc --      Pain Education --      Exclude from Growth Chart --     Most recent vital signs: Vitals:   09/21/23 1349 09/21/23 1500  BP:  112/88  Pulse:  (!) 113  Resp:  19  Temp: 97.6 F (36.4 C)   SpO2:  100%    General: Awake, no distress.  CV:  Good peripheral perfusion.  Resp:  Normal effort.  Abd:  No distention.  Other:  He looks markedly dehydrated with cracked lips and dry mucous membranes and tachycardic.  He has no fever.  He has a mildly distended but nontender tender abdomen with no rigidity or guarding, he has clear lungs, he has normal mentation and is a pleasant gentleman in no acute distress does not appear toxic.   ED Results / Procedures / Treatments   Labs (all labs ordered are listed, but only abnormal results are displayed) Labs Reviewed  COMPREHENSIVE METABOLIC PANEL - Abnormal; Notable for the following components:      Result Value   Sodium 122 (*)    Chloride 90 (*)  CO2 18 (*)    Glucose, Bld 147 (*)    Calcium 7.6 (*)    Total Protein 5.0 (*)    Albumin 1.9 (*)    AST 75 (*)    ALT 100 (*)    Alkaline Phosphatase 315 (*)    Total Bilirubin 2.1 (*)    All other components within normal limits  LACTIC ACID, PLASMA - Abnormal; Notable for the following components:   Lactic Acid, Venous 6.0 (*)    All other components within normal limits  CBC WITH DIFFERENTIAL/PLATELET - Abnormal; Notable for the following components:   RBC 3.75 (*)    Hemoglobin 12.2 (*)    HCT 34.8 (*)    RDW 16.7 (*)    Platelets 59 (*)    nRBC 0.8 (*)    Monocytes Absolute 1.6 (*)    All other components within normal limits  CULTURE, BLOOD (ROUTINE X 2)  CULTURE, BLOOD (ROUTINE X 2)  LACTIC ACID, PLASMA  URINALYSIS, W/ REFLEX TO CULTURE (INFECTION SUSPECTED)  PROTIME-INR  MAGNESIUM  TROPONIN I (HIGH SENSITIVITY)      I ordered and reviewed the above labs they are notable for his sodium is low at 122 and he has a lactic acidosis of 6.0.  EKG  ED ECG REPORT I, Pilar Jarvis, the attending physician, personally viewed and interpreted this ECG.   Date: 09/21/2023  EKG Time: 1351  Rate: 138  Rhythm: sinus tachycardia  Axis: nl  Intervals:none  ST&T Change: no stemi    RADIOLOGY I independently reviewed and interpreted chest x-ray and I see a left-sided pleural effusion I also reviewed radiologist's formal read.   PROCEDURES:  Critical Care performed: Yes, see critical care procedure note(s)  .Critical Care  Performed by: Pilar Jarvis, MD Authorized by: Pilar Jarvis, MD   Critical care provider statement:    Critical care time (minutes):  30   Critical care was time spent personally by me on the following activities:  Development of treatment plan with patient or surrogate, discussions with consultants, evaluation of patient's response to treatment, examination of patient, ordering and review of laboratory studies, ordering and review of radiographic studies, ordering and performing treatments and interventions, pulse oximetry, re-evaluation of patient's condition and review of old charts    MEDICATIONS ORDERED IN ED: Medications  sodium chloride 0.9 % bolus 1,000 mL (has no administration in time range)  ceFEPIme (MAXIPIME) 2 g in sodium chloride 0.9 % 100 mL IVPB (has no administration in time range)  metroNIDAZOLE (FLAGYL) IVPB 500 mg (has no administration in time range)  sodium chloride 0.9 % bolus 1,000 mL (1,000 mLs Intravenous New Bag/Given 09/21/23 1459)  ondansetron (ZOFRAN) injection 4 mg (4 mg Intravenous Given 09/21/23 1506)    External physician / consultants:  I spoke with hospital medicine for admission and regarding care plan for this patient.   IMPRESSION / MDM / ASSESSMENT AND PLAN / ED COURSE  I reviewed the triage vital signs and the nursing notes.                                 Patient's presentation is most consistent with acute presentation with potential threat to life or bodily function.  Differential diagnosis includes, but is not limited to, dehydration, AKI, electrolyte disturbance, dysrhythmia, sepsis   The patient is on the cardiac monitor to evaluate for evidence of arrhythmia and/or significant heart rate changes.  MDM:  This patient appears markedly dehydrated with hyponatremia in the setting of intractable nausea and vomiting with recent hospitalization for the same.  He had a syncopal episode I think in the setting of dehydration though consider dysrhythmia with a history of WPW and electrolyte disturbances.  Sinus tachycardia now.  Remain on cardiac monitoring.  No history of seizure, will slowly correct his hyponatremia by addressing his dehydration with IV crystalloid  I considered sepsis in this patient with lactic acidosis tachycardia immunocompromise so I have ordered for broad-spectrum antibiotics, will check chest x-ray, blood cultures, urinalysis.  He has no focal infectious symptoms however and his vital sign abnormalities and lactic acidosis may be driven by his marked dehydration.  Admission.      FINAL CLINICAL IMPRESSION(S) / ED DIAGNOSES   Final diagnoses:  Syncope and collapse  Hyponatremia  Dehydration  Nausea and vomiting, unspecified vomiting type     Rx / DC Orders   ED Discharge Orders     None        Note:  This document was prepared using Dragon voice recognition software and may include unintentional dictation errors.    Pilar Jarvis, MD 09/21/23 [phone]

## 2023-09-21 NOTE — Assessment & Plan Note (Signed)
Patient is presenting with a syncopal episode shortly after standing consistent with orthostatic hypotension, brought on by persistent intractable nausea/vomiting and associated AKI in the setting of chemotherapy.  - S/p 2 L bolus - Given cirrhosis and peripheral edema, start albumin 12.5 g every 6 hours for 3 doses - Push oral fluid rehydration - Zofran as needed for nausea

## 2023-09-21 NOTE — ED Notes (Signed)
Messaged pharmacy about Vancomycin missing dose to be sent to the main ED.

## 2023-09-21 NOTE — ED Notes (Signed)
Patient sitting up in bed eating dinner

## 2023-09-21 NOTE — Consult Note (Signed)
ED Pharmacy Antibiotic Sign Off An antibiotic consult was received from an ED provider for Vancomycin per pharmacy dosing for sepsis. A chart review was completed to assess appropriateness.   The following one time order(s) were placed:  Vancomycin 2gm IV x 1 dose  Further antibiotic and/or antibiotic pharmacy consults should be ordered by the admitting provider if indicated.   Thank you for allowing pharmacy to be a part of this patient's care.   Meegan Shanafelt Rodriguez-Guzman PharmD, BCPS 09/21/2023 3:31 PM

## 2023-09-21 NOTE — ED Notes (Signed)
EDP communicated to get the lactic at the 2hr mark and NOT after the completion of fluids.

## 2023-09-21 NOTE — ED Notes (Signed)
Albumin requested from pharmacy

## 2023-09-21 NOTE — ED Notes (Signed)
Md, Huel Cote at bedside talking with family.

## 2023-09-21 NOTE — Assessment & Plan Note (Signed)
Hypovolemic hyponatremia in the setting of dehydration, although history of malignant ascites and cirrhosis is likely contributing as well  - S/p 2 L bolus - Albumin as ordered - BMP every 8 hours

## 2023-09-21 NOTE — Assessment & Plan Note (Signed)
In the setting of metastatic adenocarcinoma and associated chemotherapy.  - Zofran as needed - If no relief, could try Phenergan versus Reglan

## 2023-09-21 NOTE — Assessment & Plan Note (Signed)
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  Currently undergoing chemotherapy and is hoping to get a second opinion at [facility]

## 2023-09-21 NOTE — Assessment & Plan Note (Signed)
Recent paracentesis on 12/3 with reaccumulation of ascites.  - Ultrasound-guided paracentesis ordered

## 2023-09-21 NOTE — ED Notes (Signed)
Per MD wong will obtain 2nd lactic after 2 NS bolus finish

## 2023-09-22 DIAGNOSIS — R188 Other ascites: Secondary | ICD-10-CM | POA: Diagnosis present

## 2023-09-22 DIAGNOSIS — E872 Acidosis, unspecified: Secondary | ICD-10-CM | POA: Diagnosis present

## 2023-09-22 DIAGNOSIS — J440 Chronic obstructive pulmonary disease with acute lower respiratory infection: Secondary | ICD-10-CM | POA: Diagnosis present

## 2023-09-22 DIAGNOSIS — J189 Pneumonia, unspecified organism: Secondary | ICD-10-CM | POA: Diagnosis present

## 2023-09-22 DIAGNOSIS — C7931 Secondary malignant neoplasm of brain: Secondary | ICD-10-CM | POA: Diagnosis present

## 2023-09-22 DIAGNOSIS — D638 Anemia in other chronic diseases classified elsewhere: Secondary | ICD-10-CM | POA: Diagnosis present

## 2023-09-22 DIAGNOSIS — C787 Secondary malignant neoplasm of liver and intrahepatic bile duct: Secondary | ICD-10-CM | POA: Diagnosis present

## 2023-09-22 DIAGNOSIS — N179 Acute kidney failure, unspecified: Secondary | ICD-10-CM | POA: Diagnosis present

## 2023-09-22 DIAGNOSIS — Z515 Encounter for palliative care: Secondary | ICD-10-CM | POA: Diagnosis not present

## 2023-09-22 DIAGNOSIS — C782 Secondary malignant neoplasm of pleura: Secondary | ICD-10-CM | POA: Diagnosis present

## 2023-09-22 DIAGNOSIS — R9431 Abnormal electrocardiogram [ECG] [EKG]: Secondary | ICD-10-CM | POA: Diagnosis not present

## 2023-09-22 DIAGNOSIS — R55 Syncope and collapse: Secondary | ICD-10-CM | POA: Diagnosis present

## 2023-09-22 DIAGNOSIS — J9601 Acute respiratory failure with hypoxia: Secondary | ICD-10-CM | POA: Diagnosis present

## 2023-09-22 DIAGNOSIS — K7460 Unspecified cirrhosis of liver: Secondary | ICD-10-CM | POA: Diagnosis present

## 2023-09-22 DIAGNOSIS — F32A Depression, unspecified: Secondary | ICD-10-CM | POA: Diagnosis present

## 2023-09-22 DIAGNOSIS — R180 Malignant ascites: Secondary | ICD-10-CM | POA: Diagnosis present

## 2023-09-22 DIAGNOSIS — C3492 Malignant neoplasm of unspecified part of left bronchus or lung: Secondary | ICD-10-CM | POA: Diagnosis present

## 2023-09-22 DIAGNOSIS — E8770 Fluid overload, unspecified: Secondary | ICD-10-CM | POA: Diagnosis present

## 2023-09-22 DIAGNOSIS — Z66 Do not resuscitate: Secondary | ICD-10-CM | POA: Diagnosis present

## 2023-09-22 DIAGNOSIS — J441 Chronic obstructive pulmonary disease with (acute) exacerbation: Secondary | ICD-10-CM | POA: Diagnosis present

## 2023-09-22 DIAGNOSIS — D684 Acquired coagulation factor deficiency: Secondary | ICD-10-CM | POA: Diagnosis present

## 2023-09-22 DIAGNOSIS — I471 Supraventricular tachycardia, unspecified: Secondary | ICD-10-CM | POA: Diagnosis not present

## 2023-09-22 DIAGNOSIS — C7951 Secondary malignant neoplasm of bone: Secondary | ICD-10-CM | POA: Diagnosis present

## 2023-09-22 DIAGNOSIS — E222 Syndrome of inappropriate secretion of antidiuretic hormone: Secondary | ICD-10-CM | POA: Diagnosis present

## 2023-09-22 DIAGNOSIS — J910 Malignant pleural effusion: Secondary | ICD-10-CM | POA: Diagnosis present

## 2023-09-22 DIAGNOSIS — D6959 Other secondary thrombocytopenia: Secondary | ICD-10-CM | POA: Diagnosis present

## 2023-09-22 DIAGNOSIS — Y92009 Unspecified place in unspecified non-institutional (private) residence as the place of occurrence of the external cause: Secondary | ICD-10-CM | POA: Diagnosis not present

## 2023-09-22 LAB — PHOSPHORUS: Phosphorus: 3.3 mg/dL (ref 2.5–4.6)

## 2023-09-22 LAB — BASIC METABOLIC PANEL
Anion gap: 8 (ref 5–15)
Anion gap: 9 (ref 5–15)
Anion gap: 8 (ref 5–15)
BUN: 10 mg/dL (ref 6–20)
BUN: 10 mg/dL (ref 6–20)
BUN: 12 mg/dL (ref 6–20)
CO2: 21 mmol/L — ABNORMAL LOW (ref 22–32)
CO2: 20 mmol/L — ABNORMAL LOW (ref 22–32)
CO2: 23 mmol/L (ref 22–32)
Calcium: 7.4 mg/dL — ABNORMAL LOW (ref 8.9–10.3)
Calcium: 7.3 mg/dL — ABNORMAL LOW (ref 8.9–10.3)
Calcium: 7.2 mg/dL — ABNORMAL LOW (ref 8.9–10.3)
Chloride: 90 mmol/L — ABNORMAL LOW (ref 98–111)
Chloride: 90 mmol/L — ABNORMAL LOW (ref 98–111)
Chloride: 88 mmol/L — ABNORMAL LOW (ref 98–111)
Creatinine, Ser: 0.82 mg/dL (ref 0.61–1.24)
Creatinine, Ser: 0.74 mg/dL (ref 0.61–1.24)
Creatinine, Ser: 0.83 mg/dL (ref 0.61–1.24)
GFR, Estimated: >60 mL/min (ref >60)
GFR, Estimated: >60 mL/min (ref >60)
GFR, Estimated: >60 mL/min (ref >60)
Glucose, Bld: 89 mg/dL (ref 70–99)
Glucose, Bld: 103 mg/dL — ABNORMAL HIGH (ref 70–99)
Glucose, Bld: 142 mg/dL — ABNORMAL HIGH (ref 70–99)
Potassium: 3.6 mmol/L (ref 3.5–5.1)
Potassium: 3.5 mmol/L (ref 3.5–5.1)
Potassium: 3 mmol/L — ABNORMAL LOW (ref 3.5–5.1)
Sodium: 119 mmol/L — CL (ref 135–145)
Sodium: 119 mmol/L — CL (ref 135–145)
Sodium: 119 mmol/L — CL (ref 135–145)

## 2023-09-22 LAB — CBC
HCT: 25.8 % — ABNORMAL LOW (ref 39.0–52.0)
Hemoglobin: 9.2 g/dL — ABNORMAL LOW (ref 13.0–17.0)
MCH: 32.1 pg (ref 26.0–34.0)
MCHC: 35.7 g/dL (ref 30.0–36.0)
MCV: 89.9 fL (ref 80.0–100.0)
Platelets: 53 10*3/uL — ABNORMAL LOW (ref 150–400)
RBC: 2.87 MIL/uL — ABNORMAL LOW (ref 4.22–5.81)
RDW: 15.9 % — ABNORMAL HIGH (ref 11.5–15.5)
WBC: 7.7 10*3/uL (ref 4.0–10.5)
nRBC: 0.5 % — ABNORMAL HIGH (ref 0.0–0.2)

## 2023-09-22 LAB — MAGNESIUM: Magnesium: 1.3 mg/dL — ABNORMAL LOW (ref 1.7–2.4)

## 2023-09-22 LAB — OSMOLALITY: Osmolality: 252 mosm/kg — ABNORMAL LOW (ref 275–295)

## 2023-09-22 MED ORDER — METHYLPREDNISOLONE SODIUM SUCC 40 MG IJ SOLR
40.0000 mg | Freq: Two times a day (BID) | INTRAMUSCULAR | Status: AC
  Administered 2023-09-22 – 2023-09-24 (×6): 40 mg via INTRAVENOUS
  Filled 2023-09-22 (×6): qty 1

## 2023-09-22 MED ORDER — MAGNESIUM SULFATE 4 GM/100ML IV SOLN
4.0000 g | Freq: Once | INTRAVENOUS | Status: AC
  Administered 2023-09-22: 4 g via INTRAVENOUS
  Filled 2023-09-22: qty 100

## 2023-09-22 MED ORDER — FUROSEMIDE 10 MG/ML IJ SOLN
4.0000 mg/h | INTRAMUSCULAR | Status: DC
  Administered 2023-09-22: 4 mg/h via INTRAVENOUS
  Filled 2023-09-22: qty 20

## 2023-09-22 MED ORDER — ALBUMIN HUMAN 25 % IV SOLN
12.5000 g | Freq: Four times a day (QID) | INTRAVENOUS | Status: AC
  Administered 2023-09-22 – 2023-09-24 (×8): 12.5 g via INTRAVENOUS
  Filled 2023-09-22 (×8): qty 50

## 2023-09-22 MED ORDER — PROCHLORPERAZINE EDISYLATE 10 MG/2ML IJ SOLN
10.0000 mg | Freq: Four times a day (QID) | INTRAMUSCULAR | Status: AC
  Administered 2023-09-22 (×2): 10 mg via INTRAVENOUS
  Filled 2023-09-22 (×2): qty 2

## 2023-09-22 MED ORDER — IPRATROPIUM-ALBUTEROL 0.5-2.5 (3) MG/3ML IN SOLN
3.0000 mL | Freq: Four times a day (QID) | RESPIRATORY_TRACT | Status: DC
  Administered 2023-09-22 – 2023-09-25 (×8): 3 mL via RESPIRATORY_TRACT
  Filled 2023-09-22 (×10): qty 3

## 2023-09-22 MED ORDER — FLUTICASONE FUROATE-VILANTEROL 200-25 MCG/ACT IN AEPB
1.0000 | INHALATION_SPRAY | Freq: Every day | RESPIRATORY_TRACT | Status: DC
  Administered 2023-09-23 – 2023-09-26 (×4): 1 via RESPIRATORY_TRACT
  Filled 2023-09-22 (×2): qty 28

## 2023-09-22 MED ORDER — PANTOPRAZOLE SODIUM 40 MG IV SOLR
40.0000 mg | Freq: Two times a day (BID) | INTRAVENOUS | Status: DC
  Administered 2023-09-22 – 2023-09-26 (×9): 40 mg via INTRAVENOUS
  Filled 2023-09-22 (×9): qty 10

## 2023-09-22 MED ORDER — SODIUM CHLORIDE 1 G PO TABS
2.0000 g | ORAL_TABLET | Freq: Three times a day (TID) | ORAL | Status: DC
  Administered 2023-09-22 – 2023-09-24 (×6): 2 g via ORAL
  Filled 2023-09-22 (×6): qty 2

## 2023-09-22 NOTE — ED Notes (Signed)
Advised nurse that patient has ready bed 

## 2023-09-22 NOTE — Consult Note (Signed)
[facility]  CONSULT NOTE    Date: 09/22/2023                  Patient Name:  [name]  MRN: [id number]  DOB: 1981/06/07  Age / Sex: 42 y.o., male         PCP: Pcp, No                 Service Requesting Consult: Dr. Lucianne Muss                 Reason for Consult: hyponatremia            History of Present Illness: [name] is a 42 y.o.  male with metastatic lung cancer who is admitted to [facility] for syncope at home witnessed by family. Patient's wife is at bedside who assists with history taking.   Patient is slow to answer questions this morning. He states there was no witnessed seizure.   Patient has shortness of breath, lower extremity edema, and abdominal distension.   Nephrology consulted for hyponatremia of 119. Baseline seems to be 120-125.    Medications: Outpatient medications: (Not in a hospital admission)   Current medications: Current Facility-Administered Medications  Medication Dose Route Frequency Provider Last Rate Last Admin   acetaminophen (TYLENOL) tablet 650 mg  650 mg Oral Q6H PRN Verdene Lennert, MD       Or   acetaminophen (TYLENOL) suppository 650 mg  650 mg Rectal Q6H PRN Verdene Lennert, MD       albuterol (PROVENTIL) (2.5 MG/3ML) 0.083% nebulizer solution 2.5 mg  2.5 mg Inhalation Q4H PRN Verdene Lennert, MD   2.5 mg at 09/21/23 1847   folic acid (FOLVITE) tablet 1 mg  1 mg Oral Daily Verdene Lennert, MD   1 mg at 09/21/23 1954   magnesium sulfate IVPB 4 g 100 mL  4 g Intravenous Once Gillis Santa, MD 50 mL/hr at 09/22/23 0922 4 g at 09/22/23 0922   OLANZapine (ZYPREXA) tablet 5 mg  5 mg Oral QHS PRN Verdene Lennert, MD   5 mg at 09/21/23 2244   ondansetron (ZOFRAN) tablet 4 mg  4 mg Oral Q6H PRN Verdene Lennert, MD       Or   ondansetron (ZOFRAN) injection 4 mg  4 mg Intravenous Q6H PRN Verdene Lennert, MD       polyethylene glycol (MIRALAX / GLYCOLAX) packet 17 g  17 g Oral Daily PRN Verdene Lennert, MD        sertraline (ZOLOFT) tablet 50 mg  50 mg Oral Daily Verdene Lennert, MD   50 mg at 09/21/23 1955   sodium chloride flush (NS) 0.9 % injection 3 mL  3 mL Intravenous Q12H Verdene Lennert, MD   3 mL at 09/21/23 2210   sodium chloride tablet 1 g  1 g Oral TID Verdene Lennert, MD   1 g at 09/21/23 2241   Current Outpatient Medications  Medication Sig Dispense Refill   albuterol (VENTOLIN HFA) 108 (90 Base) MCG/ACT inhaler Inhale 2 puffs into the lungs every 6 (six) hours as needed for wheezing or shortness of breath. 8 g 2   calcium carbonate (TUMS EX) 750 MG chewable tablet Chew 1 tablet by mouth daily.     folic acid (FOLVITE) 1 MG tablet TAKE 1 TABLET BY MOUTH EVERY DAY 90 tablet 1   lidocaine-prilocaine (EMLA) cream Apply 1 Application topically as needed. 30 g 0   methylphenidate (RITALIN) 5 MG tablet  Take 1 tablet (5 mg total) by mouth 2 (two) times daily as needed (fatigue). 60 tablet 0   metoCLOPramide (REGLAN) 10 MG tablet Take 1 tablet (10 mg total) by mouth every 8 (eight) hours as needed for nausea. 30 tablet 1   montelukast (SINGULAIR) 10 MG tablet TAKE 1 TABLET BY MOUTH EVERYDAY AT BEDTIME 90 tablet 1   OLANZapine (ZYPREXA) 5 MG tablet Take 1 tablet (5 mg total) by mouth at bedtime as needed (nausea). 30 tablet 0   ondansetron (ZOFRAN-ODT) 4 MG disintegrating tablet Take 1 tablet (4 mg total) by mouth every 8 (eight) hours as needed for nausea or vomiting. 20 tablet 1   prochlorperazine (COMPAZINE) 10 MG tablet Take 1 tablet (10 mg total) by mouth every 6 (six) hours as needed for nausea or vomiting. 30 tablet 0   promethazine (PHENERGAN) 25 MG tablet Take 1 tablet (25 mg total) by mouth every 6 (six) hours as needed for nausea or vomiting. 30 tablet 0   sertraline (ZOLOFT) 50 MG tablet Take 1 tablet (50 mg total) by mouth daily. 30 tablet 1   sodium chloride 1 g tablet Take 1 tablet (1 g total) by mouth 3 (three) times daily. For low sodium levels 120 tablet 0   triamcinolone ointment  (KENALOG) 0.5 % Apply 1 Application topically 2 (two) times daily. 30 g 1   ELIQUIS 2.5 MG TABS tablet TAKE 1 TABLET BY MOUTH TWICE A DAY (Patient not taking: Reported on 09/21/2023) 60 tablet 4   metoprolol succinate (TOPROL-XL) 50 MG 24 hr tablet Take 50 mg by mouth daily. Take with or immediately following a meal. (Patient not taking: Reported on 09/21/2023)     Facility-Administered Medications Ordered in Other Encounters  Medication Dose Route Frequency Provider Last Rate Last Admin   heparin lock flush 100 UNIT/ML injection            heparin lock flush 100 UNIT/ML injection            heparin lock flush 100 unit/mL  500 Units Intravenous Once Borders, Ivin Booty R, NP       sodium chloride flush (NS) 0.9 % injection 10 mL  10 mL Intravenous PRN Louretta Shorten R, MD   10 mL at 07/28/21 0852   sodium chloride flush (NS) 0.9 % injection 10 mL  10 mL Intravenous Once Borders, Daryl Eastern, NP          Allergies: Allergies  Allergen Reactions   Codeine     Makes pt hyper      Past Medical History: Past Medical History:  Diagnosis Date   Cancer (HCC)    Dyspnea    Heartburn    Pneumonia    Wolff-Parkinson-White (WPW) 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     Past Surgical History: Past Surgical History:  Procedure Laterality Date   FRACTURE SURGERY     broke femur when he was 6 years   PORTA CATH INSERTION N/A 03/28/2021   Procedure: PORTA CATH INSERTION;  Surgeon: Renford Dills, MD;  Location: [facility] INVASIVE CV LAB;  Service: Cardiovascular;  Laterality: N/A;   VIDEO BRONCHOSCOPY WITH ENDOBRONCHIAL NAVIGATION N/A 03/15/2021   Procedure: VIDEO BRONCHOSCOPY WITH ENDOBRONCHIAL NAVIGATION;  Surgeon: Vida Rigger, MD;  Location: [facility] ORS;  Service: Thoracic;  Laterality: N/A;   VIDEO BRONCHOSCOPY WITH ENDOBRONCHIAL ULTRASOUND N/A 03/15/2021   Procedure: VIDEO BRONCHOSCOPY WITH ENDOBRONCHIAL ULTRASOUND;  Surgeon: Vida Rigger, MD;  Location: [facility] ORS;  Service: Thoracic;  Laterality:  N/A;  Family History: Family History  Problem Relation Age of Onset   High Cholesterol Mother    Hypertension Father    Lung cancer Father    Skin cancer Father      Social History: Social History   Socioeconomic History   Marital status: Married    Spouse name: Darl Pikes   Number of children: 2   Years of education: Not on file   Highest education level: Not on file  Occupational History   Not on file  Tobacco Use   Smoking status: Never   Smokeless tobacco: Former    Types: Snuff, Chew  Vaping Use   Vaping status: Never Used  Substance and Sexual Activity   Alcohol use: Never   Drug use: Never   Sexual activity: Yes  Other Topics Concern   Not on file  Social History Narrative   [place]; with wife; 2 daughters[12 and 22]; never smoked; rare alcohol. 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 Dog and cat.   Social Determinants of Health   Financial Resource Strain: Low Risk  (05/10/2022)   Overall Financial Resource Strain (CARDIA)    Difficulty of Paying Living Expenses: Not hard at all  Food Insecurity: No Food Insecurity (05/10/2022)   Hunger Vital Sign    Worried About Running Out of Food in the Last Year: Never true    Ran Out of Food in the Last Year: Never true  Transportation Needs: No Transportation Needs (03/16/2022)   PRAPARE - Administrator, Civil Service (Medical): No    Lack of Transportation (Non-Medical): No  Physical Activity: Not on file  Stress: Not on file  Social Connections: Socially Integrated (05/10/2022)   Social Connection and Isolation Panel [NHANES]    Frequency of Communication with Friends and Family: More than three times a week    Frequency of Social Gatherings with Friends and Family: More than three times a week    Attends Religious Services: More than 4 times per year    Active Member of [employer] or Organizations: Yes    Attends Engineer, structural: More than 4 times per year    Marital Status: Married  Catering manager  Violence: Not on file      Vital Signs: Blood pressure 126/89, pulse (!) 115, temperature 97.9 F (36.6 C), temperature source Oral, resp. rate 16, height 5\' 10"  (1.778 m), weight 86.2 kg, SpO2 100%.  Weight trends: Filed Weights   09/21/23 1340  Weight: 86.2 kg    Physical Exam: General: NAD, laying in bed  Head: Normocephalic, atraumatic. Moist oral mucosal membranes  Eyes: Anicteric, PERRL  Neck: Supple, trachea midline  Lungs:  Clear to auscultation  Heart: Regular rate and rhythm  Abdomen:  Soft, nontender,   Extremities:  ++ peripheral edema.  Neurologic: Nonfocal, moving all four extremities  Skin: No lesions  Access: none     Lab results: Basic Metabolic Panel: Recent Labs  Lab 09/21/23 1405 09/21/23 1407 09/21/23 2104 09/22/23 0511  NA  --  122* 122* 119*  K  --  4.2 3.7 3.6  CL  --  90* 92* 90*  CO2  --  18* 24 21*  GLUCOSE  --  147* 104* 89  BUN  --  11 11 10   CREATININE  --  1.00 0.84 0.82  CALCIUM  --  7.6* 7.0* 7.4*  MG 1.3*  --   --  1.3*  PHOS  --   --   --  3.3  Liver Function Tests: Recent Labs  Lab 09/17/23 1036 09/19/23 0822 09/21/23 1407  AST 69* 75* 75*  ALT 80* 87* 100*  ALKPHOS 234* 239* 315*  BILITOT 2.2* 1.2* 2.1*  PROT 4.6* 4.4* 5.0*  ALBUMIN 1.8* 1.6* 1.9*   No results for input(s): "LIPASE", "AMYLASE" in the last 168 hours. No results for input(s): "AMMONIA" in the last 168 hours.  CBC: Recent Labs  Lab 09/17/23 1036 09/19/23 0822 09/21/23 1407 09/22/23 0511  WBC 2.7* 1.8* 7.4 7.7  NEUTROABS 1.5* 0.6* 4.7  --   HGB 10.4* 10.3* 12.2* 9.2*  HCT 29.6* 29.3* 34.8* 25.8*  MCV 91.1 90.2 92.8 89.9  PLT 23* 18* 59* 53*    Cardiac Enzymes: No results for input(s): "CKTOTAL", "CKMB", "CKMBINDEX", "TROPONINI" in the last 168 hours.  BNP: Invalid input(s): "POCBNP"  CBG: No results for input(s): "GLUCAP" in the last 168 hours.  Microbiology: Results for orders placed or performed during the hospital encounter  of 09/21/23  Culture, blood (Routine x 2)     Status: None (Preliminary result)   Collection Time: 09/21/23  2:19 PM   Specimen: BLOOD  Result Value Ref Range Status   Specimen Description BLOOD PORTA CATH  Final   Special Requests   Final    BOTTLES DRAWN AEROBIC AND ANAEROBIC Blood Culture results may not be optimal due to an inadequate volume of blood received in culture bottles   Culture   Final    NO GROWTH < 24 HOURS Performed at [facility], [address] [place], [place] [address]    Report Status PENDING  Incomplete  Culture, blood (Routine x 2)     Status: None (Preliminary result)   Collection Time: 09/21/23  2:19 PM   Specimen: BLOOD LEFT ARM  Result Value Ref Range Status   Specimen Description BLOOD LEFT ARM  Final   Special Requests   Final    BOTTLES DRAWN AEROBIC AND ANAEROBIC Blood Culture adequate volume   Culture   Final    NO GROWTH < 24 HOURS Performed at [facility], [address], [place], [place] [address]    Report Status PENDING  Incomplete    Coagulation Studies: Recent Labs    09/21/23 1609  LABPROT 20.9*  INR 1.8*    Urinalysis: Recent Labs    09/21/23 2007  COLORURINE AMBER*  LABSPEC 1.017  PHURINE 5.0  GLUCOSEU NEGATIVE  HGBUR NEGATIVE  BILIRUBINUR NEGATIVE  KETONESUR NEGATIVE  PROTEINUR NEGATIVE  NITRITE NEGATIVE  LEUKOCYTESUR NEGATIVE      Imaging: DG Chest 2 View  Result Date: 09/21/2023 CLINICAL DATA:  Suspected sepsis. Lung and liver cancer per patient. Vomiting. EXAM: CHEST - 2 VIEW COMPARISON:  Chest radiographs 09/09/2023 and 09/06/2023. Chest CT 07/16/2022. FINDINGS: Right IJ Port-A-Cath extends to the level of the superior cavoatrial junction, stable. The visualized heart size and mediastinal contours are stable. A large laterally loculated left pleural effusion appears slightly increased compared with the most recent prior study. Associated chronic compressive left lung atelectasis. No evidence  of right-sided pleural effusion or pneumothorax. Mild right basilar atelectasis, unchanged. The bones appear unchanged. IMPRESSION: Slight increase in size of a large laterally loculated left pleural effusion with associated chronic compressive left lung atelectasis. No other significant changes. Electronically Signed   By: Carey Bullocks M.D.   On: 09/21/2023 14:19     Assessment & Plan: Mr. HARREL PELFREY is a 42 y.o.  male with WPW, thrombocytopenia, adenocarcinoma of the lungs with metastasis  to brain, bones and liver , who was admitted to [facility] on 09/21/2023 for Syncope [R55]  Hyponatremia: hypo-osmolar. Hypervolemic.  - serial sodiums - low threshold to place on loop diuretics - discussed the role of tolvaptan with patient and family.      LOS: 0 Americus Perkey 12/8/202411:11 AM

## 2023-09-22 NOTE — ED Notes (Signed)
Care transferred to [facility] pt moved to room 33 pt remains aox4 appears in nad spouse with pt albumin continues to infuse without apparent difficulty pt N/V resolved pt spouse provided recliner

## 2023-09-22 NOTE — ED Notes (Signed)
Albumin requested from pharmacy

## 2023-09-22 NOTE — ED Notes (Signed)
Attending notified of critical NA 119 via secure chat.

## 2023-09-22 NOTE — Progress Notes (Addendum)
[facility] Progress Note  Patient: [name]    TKZ:[id number]  DOA: 09/21/2023     Date of Service: the patient was seen and examined on 09/22/2023  Chief Complaint  Patient presents with   Loss of Consciousness   Brief hospital course: DAVONTAE RUSTON is a 42 y.o. male with medical history significant of stage IV lung adenocarcinoma with brain, liver, bone metastasis on Carboplatin-Alimta-Amivatimab, associated thrombocytopenia, Wolff-Parkinson-White syndrome on metoprolol, who presents to the ED due to syncope.  Patient is having nausea and vomiting for a few days, today patient was noted to have significant dyspnea on exertion when he went out for lunch.  Patient got home back and his legs gave out, suddenly LOC and patient passed out, caught by his father and wife, no trauma.  Patient regained consciousness quickly.  No seizure activity.  ED workup: VS HR 130 tachycardia, RR 22 tachypneic, BP 118/83, 99% on 2 L, afebrile BMP hyponatremia sodium 122, calcium 7.0 low could be due to low albumin 1.9 mildly elevated AST and ALT, bilirubin 2.1 elevated Lactic acidosis lactic acid 6.0 most likely due to intravascular volume depletion Chronic anemia hemoglobin 12.2, thrombocytopenia platelet 59 Blood culture sent CXR: Slight increase in size of a large laterally loculated left pleural effusion with associated chronic compressive left lung atelectasis. No other significant changes.   Assessment and Plan:  # Hypotonic hyponatremia due to liver cirrhosis, volume overload Chronic hyponatremia, gradually getting worse Serum osmolality 252 low Na 122--119 Started sodium chloride tablet 2 g p.o. 3 times daily Monitor BMP every 8 hourly Goal to raise sodium level 8 to 10 mEq in 24 hours Nephrology consulted, currently avoided 3% saline, we will continue to monitor.  # Recurrent malignant left pleural effusion 11/25 s/p thoracentesis Started IV Lasix and albumin IR consulted to  repeat thoracentesis   # Anasarca due to liver cirrhosis and Ascites  12/3 S/p paracentesis Monitor for recurrence Started Lasix IV infusion Started albumin IV 12.5 g every 6 hourly for 2 days  # COPD his admission 12/8 patient was wheezing on my exam Started Solu-Medrol IV 40 mg Q12 hourly for 3 days PPI for GI prophylaxis DuoNeb every 6 hourly Breo Ellipta inhaler   # Syncope, likely due to orthostatic hypotension Patient has intravascular volume depletion due to low albumin S/p 2 L bolus given in the ED Due to liver cirrhosis and peripheral edema patient was given albumin 12.5 g every 6 hourly x 3 doses Continue fall precautions, monitor orthostatics Continue supportive care  # Lactic acidosis and metabolic acidosis most likely due to intractable vomiting and dehydration  # Intractable nausea and vomiting S/p vancomycin, cefepime and Flagyl 1 dose given in the ED, no more need of antibiotics, no signs of infection. S/p IV fluid given, lactic acidosis resolved Monitor bicarb level Continue symptomatic treatment for nausea and vomiting Started pantoprazole 40 mg IV twice daily for GI prophylaxis  # Hypomagnesemia, mag repleted. Monitor electrolytes and replete as needed.  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  # Transaminitis, decompensated liver cirrhosis Monitor LFTs and bilirubin  # WPW (Wolff-Parkinson-White syndrome) Long-term history of Wolff-Parkinson-White syndrome on metoprolol.  Syncopal episode consistent with orthostasis, however will monitor on telemetry overnight - Telemetry monitoring - Continue home metoprolol   #  Adenocarcinoma of lung, stage 4, left History of stage IV adenocarcinoma of the left lung with metastasis to the  pleural space, liver, brain, bone.  Currently undergoing chemotherapy and is hoping to get a second opinion at [facility]  Overall long-term prognosis is poor.  Goals of care discussed patient's wife at bedside, who agreed with DNR/DNI and palliative care will be consulted.  We will continue medical management, if patient's condition does not improve or deteriorates then may transition to hospice care.  Body mass index is 27.26 kg/m.  Interventions:  Diet: Regular diet DVT Prophylaxis: SCD, pharmacological prophylaxis contraindicated due to thrombocytopenia, coagulopathy    Advance goals of care discussion: DNR/DNI-Limited  Family Communication: family was present at bedside, at the time of interview.  The pt provided permission to discuss medical plan with the family. Opportunity was given to ask question and all questions were answered satisfactorily.   Disposition:  Pt is from Home, admitted with Syncope, hyponatremia, stage IV metastatic cancer, still has low sodium, which precludes a safe discharge. Discharge to home, when stable.  Subjective: No significant events overnight, patient was sleepy, woke up and aware about person and month, does not know the year and not aware where is he right now.  Patient was dozing off and denied any complaints, resting comfortably. Above management plan discussed with patient's wife  Physical Exam: General: NAD, lying comfortably Appear in no distress, affect appropriate Eyes: PERRLA ENT: Oral Mucosa Clear, moist  Neck: no JVD,  Cardiovascular: S1 and S2 Present, no Murmur,  Respiratory: Equal air entry bilaterally, bilateral wheezing, bibasilar crackles Abdomen: Bowel Sound present, Soft but slightly distended due to ascites, no tenderness,  Skin: no rashes Extremities: 4+ pedal edema, no calf tenderness Neurologic: without any new focal findings Gait not checked due to patient safety concerns  Vitals:   09/22/23 0700 09/22/23 0830  09/22/23 1030 09/22/23 1230  BP:  (!) 135/94 126/89 124/85  Pulse: (!) 120 (!) 117 (!) 115 (!) 115  Resp: 20 19 16  (!) 21  Temp:      TempSrc:      SpO2: 100% 100% 100% 100%  Weight:      Height:        Intake/Output Summary (Last 24 hours) at 09/22/2023 1401 Last data filed at 09/22/2023 0300 Gross per 24 hour  Intake 2950 ml  Output --  Net 2950 ml   Filed Weights   09/21/23 1340  Weight: 86.2 kg    Data Reviewed: I have personally reviewed and interpreted daily labs, tele strips, imagings as discussed above. I reviewed all nursing notes, pharmacy notes, vitals, pertinent old records I have discussed plan of care as described above with RN and patient/family.  CBC: Recent Labs  Lab 09/17/23 1036 09/19/23 0822 09/21/23 1407 09/22/23 0511  WBC 2.7* 1.8* 7.4 7.7  NEUTROABS 1.5* 0.6* 4.7  --   HGB 10.4* 10.3* 12.2* 9.2*  HCT 29.6* 29.3* 34.8* 25.8*  MCV 91.1 90.2 92.8 89.9  PLT 23* 18* 59* 53*   Basic Metabolic Panel: Recent Labs  Lab 09/19/23 0822 09/21/23 1405 09/21/23 1407 09/21/23 2104 09/22/23 0511 09/22/23 1314  NA 125*  --  122* 122* 119* 119*  K 3.5  --  4.2 3.7 3.6 3.5  CL 95*  --  90* 92* 90* 90*  CO2 26  --  18* 24 21* 20*  GLUCOSE 129*  --  147* 104* 89 103*  BUN 10  --  11 11 10  10  CREATININE 0.58*  --  1.00 0.84 0.82 0.74  CALCIUM 7.2*  --  7.6* 7.0* 7.4* 7.3*  MG  --  1.3*  --   --  1.3*  --   PHOS  --   --   --   --  3.3  --     Studies: DG Chest 2 View  Result Date: 09/21/2023 CLINICAL DATA:  Suspected sepsis. Lung and liver cancer per patient. Vomiting. EXAM: CHEST - 2 VIEW COMPARISON:  Chest radiographs 09/09/2023 and 09/06/2023. Chest CT 07/16/2022. FINDINGS: Right IJ Port-A-Cath extends to the level of the superior cavoatrial junction, stable. The visualized heart size and mediastinal contours are stable. A large laterally loculated left pleural effusion appears slightly increased compared with the most recent prior study. Associated  chronic compressive left lung atelectasis. No evidence of right-sided pleural effusion or pneumothorax. Mild right basilar atelectasis, unchanged. The bones appear unchanged. IMPRESSION: Slight increase in size of a large laterally loculated left pleural effusion with associated chronic compressive left lung atelectasis. No other significant changes. Electronically Signed   By: Carey Bullocks M.D.   On: 09/21/2023 14:19    Scheduled Meds:  fluticasone furoate-vilanterol  1 puff Inhalation Daily   folic acid  1 mg Oral Daily   ipratropium-albuterol  3 mL Nebulization Q6H   methylPREDNISolone (SOLU-MEDROL) injection  40 mg Intravenous Q12H   pantoprazole (PROTONIX) IV  40 mg Intravenous Q12H   prochlorperazine  10 mg Intravenous Q6H   sertraline  50 mg Oral Daily   sodium chloride flush  3 mL Intravenous Q12H   sodium chloride  1 g Oral TID   Continuous Infusions:  albumin human 12.5 g (09/22/23 1350)   furosemide (LASIX) 200 mg in dextrose 5 % 100 mL (2 mg/mL) infusion 4 mg/hr (09/22/23 1311)   PRN Meds: acetaminophen **OR** acetaminophen, OLANZapine, ondansetron **OR** ondansetron (ZOFRAN) IV, polyethylene glycol  Time spent: 55 minutes  Author: Gillis Santa. MD [facility] 09/22/2023 2:01 PM  To reach On-call, see care teams to locate the attending and reach out to them via [email or web address]. If 7PM-7AM, please contact night-coverage If you still have difficulty reaching the attending provider, please page the [facility] (Director on Call) for [facility] on amion for assistance.

## 2023-09-22 NOTE — ED Notes (Signed)
ED TO INPATIENT HANDOFF REPORT  ED Nurse Name and Phone #: Lourene Hoston [phone]  S Name/Age/Gender [name] 42 y.o. male Room/Bed: ED33A/ED33A  Code Status   Code Status: Limited: Do not attempt resuscitation (DNR) -DNR-LIMITED -Do Not Intubate/DNI   Home/SNF/Other Home Patient oriented to: self, place, time, and situation Is this baseline? Yes   Triage Complete: Triage complete  Chief Complaint Syncope [R55] Hyponatremia [E87.1]  Triage Note Pt in via EMS from home with c/o syncopal episode. Pt has lung cancer that has mets to several places. 95/70, 109/54, 99% RA, HR 130, CBG 147  Pt states he has lung and liver CA. Pt has been vomiting for the past 3 weeks and was recently admitted for the same. He has received platelets at the cancer center. Pt has been weak and was at [employer] and his wife caught him and lowered him to the floor. Pt doesn't think he passed out but wife verifies that he did.    Allergies Allergies  Allergen Reactions   Codeine     Makes pt hyper    Level of Care/Admitting Diagnosis ED Disposition     ED Disposition  Admit   Condition  --   Comment  Hospital Area: [facility] [[id number]]  Level of Care: Telemetry Cardiac [103]  Covid Evaluation: Asymptomatic - no recent exposure (last 10 days) testing not required  Diagnosis: Hyponatremia [784696]  Admitting Physician: Gillis Santa [[id number]]  Attending Physician: Clearance Coots  Certification:: I certify this patient will need inpatient services for at least 2 midnights          B Medical/Surgery History Past Medical History:  Diagnosis Date   Cancer (HCC)    Dyspnea    Heartburn    Pneumonia    Wolff-Parkinson-White (WPW) 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   Past Surgical History:  Procedure Laterality Date   FRACTURE SURGERY     broke femur when he was 6 years   PORTA CATH INSERTION N/A 03/28/2021   Procedure: PORTA CATH INSERTION;  Surgeon:  Renford Dills, MD;  Location: [facility] INVASIVE CV LAB;  Service: Cardiovascular;  Laterality: N/A;   VIDEO BRONCHOSCOPY WITH ENDOBRONCHIAL NAVIGATION N/A 03/15/2021   Procedure: VIDEO BRONCHOSCOPY WITH ENDOBRONCHIAL NAVIGATION;  Surgeon: Vida Rigger, MD;  Location: [facility] ORS;  Service: Thoracic;  Laterality: N/A;   VIDEO BRONCHOSCOPY WITH ENDOBRONCHIAL ULTRASOUND N/A 03/15/2021   Procedure: VIDEO BRONCHOSCOPY WITH ENDOBRONCHIAL ULTRASOUND;  Surgeon: Vida Rigger, MD;  Location: [facility] ORS;  Service: Thoracic;  Laterality: N/A;     A IV Location/Drains/Wounds Patient Lines/Drains/Airways Status     Active Line/Drains/Airways     Name Placement date Placement time Site Days   Implanted Port Left Chest --  --  Chest  --   Peripheral IV 09/21/23 20 G 1.88" Anterior;Left;Upper Arm 09/21/23  1449  Arm  1            Intake/Output Last 24 hours  Intake/Output Summary (Last 24 hours) at 09/22/2023 1746 Last data filed at 09/22/2023 0300 Gross per 24 hour  Intake 750 ml  Output --  Net 750 ml    Labs/Imaging Results for orders placed or performed during the hospital encounter of 09/21/23 (from the past 48 hour(s))  Magnesium     Status: Abnormal   Collection Time: 09/21/23  2:05 PM  Result Value Ref Range   Magnesium 1.3 (L) 1.7 - 2.4 mg/dL    Comment: Performed at [facility], [address] [place], [place] [address]  Troponin I (High Sensitivity)     Status: Abnormal   Collection Time: 09/21/23  2:05 PM  Result Value Ref Range   Troponin I (High Sensitivity) 21 (H) <18 ng/L    Comment: (NOTE) Elevated high sensitivity troponin I (hsTnI) values and significant  changes across serial measurements may suggest ACS but many other  chronic and acute conditions are known to elevate hsTnI results.  Refer to the "Links" section for chest pain algorithms and additional  guidance. Performed at [facility], [address] [place], [place] [address]    Comprehensive metabolic panel     Status: Abnormal   Collection Time: 09/21/23  2:07 PM  Result Value Ref Range   Sodium 122 (L) 135 - 145 mmol/L   Potassium 4.2 3.5 - 5.1 mmol/L   Chloride 90 (L) 98 - 111 mmol/L   CO2 18 (L) 22 - 32 mmol/L   Glucose, Bld 147 (H) 70 - 99 mg/dL    Comment: Glucose reference range applies only to samples taken after fasting for at least 8 hours.   BUN 11 6 - 20 mg/dL   Creatinine, Ser 0.86 0.61 - 1.24 mg/dL   Calcium 7.6 (L) 8.9 - 10.3 mg/dL   Total Protein 5.0 (L) 6.5 - 8.1 g/dL   Albumin 1.9 (L) 3.5 - 5.0 g/dL   AST 75 (H) 15 - 41 U/L   ALT 100 (H) 0 - 44 U/L   Alkaline Phosphatase 315 (H) 38 - 126 U/L   Total Bilirubin 2.1 (H) <1.2 mg/dL   GFR, Estimated >57 >84 mL/min    Comment: (NOTE) Calculated using the CKD-EPI Creatinine Equation (2021)    Anion gap 14 5 - 15    Comment: Performed at  [facility], [address] [place], [place] [address]  Lactic acid, plasma     Status: Abnormal   Collection Time: 09/21/23  2:07 PM  Result Value Ref Range   Lactic Acid, Venous 6.0 (HH) 0.5 - 1.9 mmol/L    Comment: CRITICAL RESULT CALLED TO, READ BACK BY AND VERIFIED WITH REINA GALJOUR AT 1441 09/21/23.PMF Performed at [facility], [address] [place], [place] [address]   CBC with Differential     Status: Abnormal   Collection Time: 09/21/23  2:07 PM  Result Value Ref Range   WBC 7.4 4.0 - 10.5 K/uL   RBC 3.75 (L) 4.22 - 5.81 MIL/uL   Hemoglobin 12.2 (L) 13.0 - 17.0 g/dL   HCT 32.4 (L) 40.1 - 02.7 %   MCV 92.8 80.0 - 100.0 fL   MCH 32.5 26.0 - 34.0 pg   MCHC 35.1 30.0 - 36.0 g/dL   RDW 25.3 (H) 66.4 - 40.3 %   Platelets 59 (L) 150 - 400 K/uL    Comment: Immature Platelet Fraction may be clinically indicated, consider ordering this additional test [id number]    nRBC 0.8 (H) 0.0 - 0.2 %   Neutrophils Relative % 63 %   Neutro Abs 4.7 1.7 - 7.7 K/uL   Lymphocytes Relative 14 %   Lymphs Abs 1.0 0.7 - 4.0 K/uL    Monocytes Relative 21 %   Monocytes Absolute 1.6 (H) 0.1 - 1.0 K/uL   Eosinophils Relative 0 %   Eosinophils Absolute 0.0 0.0 - 0.5 K/uL   Basophils Relative 1 %   Basophils Absolute 0.0 0.0 - 0.1 K/uL   WBC Morphology MORPHOLOGY UNREMARKABLE    Smear Review Normal platelet morphology  Comment: PLATELETS APPEAR DECREASED   Immature Granulocytes 1 %   Abs Immature Granulocytes 0.07 0.00 - 0.07 K/uL   Polychromasia PRESENT     Comment: Performed at [facility], [address] [place], [place] [address]  Culture, blood (Routine x 2)     Status: None (Preliminary result)   Collection Time: 09/21/23  2:19 PM   Specimen: BLOOD  Result Value Ref Range   Specimen Description BLOOD PORTA CATH    Special Requests      BOTTLES DRAWN AEROBIC AND ANAEROBIC Blood Culture results may not be optimal due to an inadequate volume of blood received in culture bottles   Culture      NO GROWTH < 24 HOURS Performed at [facility], [address] [place], [place] [address]    Report Status PENDING   Culture, blood (Routine x 2)     Status: None (Preliminary result)   Collection Time: 09/21/23  2:19 PM   Specimen: BLOOD LEFT ARM  Result Value Ref Range   Specimen Description BLOOD LEFT ARM    Special Requests      BOTTLES DRAWN AEROBIC AND ANAEROBIC Blood Culture adequate volume   Culture      NO GROWTH < 24 HOURS Performed at [facility], [address], [place], [place] [address]    Report Status PENDING   Lactic acid, plasma     Status: Abnormal   Collection Time: 09/21/23  4:09 PM  Result Value Ref Range   Lactic Acid, Venous 4.4 (HH) 0.5 - 1.9 mmol/L    Comment: CRITICAL RESULT CALLED TO, READ BACK BY AND VERIFIED WITH KERRY NELSON ON 09/21/23 AT 1649 QSD Performed at [facility], [address] [place], [place] [address]   Protime-INR     Status: Abnormal   Collection Time: 09/21/23  4:09 PM  Result Value Ref Range   Prothrombin Time 20.9  (H) 11.4 - 15.2 seconds   INR 1.8 (H) 0.8 - 1.2    Comment: (NOTE) INR goal varies based on device and disease states. Performed at [facility], [address] [place], [place] [address]   Urinalysis, w/ Reflex to Culture (Infection Suspected) -Urine, Clean Catch     Status: Abnormal   Collection Time: 09/21/23  8:07 PM  Result Value Ref Range   Specimen Source URINE, CATHETERIZED    Color, Urine AMBER (A) YELLOW    Comment: BIOCHEMICALS MAY BE AFFECTED BY COLOR   APPearance HAZY (A) CLEAR   Specific Gravity, Urine 1.017 1.005 - 1.030   pH 5.0 5.0 - 8.0   Glucose, UA NEGATIVE NEGATIVE mg/dL   Hgb urine dipstick NEGATIVE NEGATIVE   Bilirubin Urine NEGATIVE NEGATIVE   Ketones, ur NEGATIVE NEGATIVE mg/dL   Protein, ur NEGATIVE NEGATIVE mg/dL   Nitrite NEGATIVE NEGATIVE   Leukocytes,Ua NEGATIVE NEGATIVE   RBC / HPF 0-5 0 - 5 RBC/hpf   WBC, UA 6-10 0 - 5 WBC/hpf    Comment:        Reflex urine culture not performed if WBC <=10, OR if Squamous epithelial cells >5. If Squamous epithelial cells >5 suggest recollection.    Bacteria, UA NONE SEEN NONE SEEN   Squamous Epithelial / HPF 0-5 0 - 5 /HPF   Mucus PRESENT    Hyaline Casts, UA PRESENT     Comment: Performed at [facility], [address], [place], [place] [address]  Basic metabolic panel     Status: Abnormal  Collection Time: 09/21/23  9:04 PM  Result Value Ref Range   Sodium 122 (L) 135 - 145 mmol/L   Potassium 3.7 3.5 - 5.1 mmol/L   Chloride 92 (L) 98 - 111 mmol/L   CO2 24 22 - 32 mmol/L   Glucose, Bld 104 (H) 70 - 99 mg/dL    Comment: Glucose reference range applies only to samples taken after fasting for at least 8 hours.   BUN 11 6 - 20 mg/dL   Creatinine, Ser 1.09 0.61 - 1.24 mg/dL   Calcium 7.0 (L) 8.9 - 10.3 mg/dL   GFR, Estimated >32 >35 mL/min    Comment: (NOTE) Calculated using the CKD-EPI Creatinine Equation (2021)    Anion gap 6 5 - 15    Comment: Performed at [facility], [address] [place], [place] [address]  Lactic acid, plasma     Status: None   Collection Time: 09/21/23 11:00 PM  Result Value Ref Range   Lactic Acid, Venous 1.6 0.5 - 1.9 mmol/L    Comment: Performed at [facility], [address], [place], [place] [address]  Basic metabolic panel     Status: Abnormal   Collection Time: 09/22/23  5:11 AM  Result Value Ref Range   Sodium 119 (LL) 135 - 145 mmol/L    Comment: CRITICAL RESULT CALLED TO, READ BACK BY AND VERIFIED WITH: KAYLA ALFORD 09/22/2023 AT 0619 LFD    Potassium 3.6 3.5 - 5.1 mmol/L   Chloride 90 (L) 98 - 111 mmol/L   CO2 21 (L) 22 - 32 mmol/L   Glucose, Bld 89 70 - 99 mg/dL    Comment: Glucose reference range applies only to samples taken after fasting for at least 8 hours.   BUN 10 6 - 20 mg/dL   Creatinine, Ser 7.06 0.61 - 1.24 mg/dL   Calcium 7.4 (L) 8.9 - 10.3 mg/dL   GFR, Estimated >23 >76 mL/min    Comment: (NOTE) Calculated using the CKD-EPI Creatinine Equation (2021)    Anion gap 8 5 - 15    Comment: Performed at [facility], [address] [place], [place] [address]  CBC     Status: Abnormal   Collection Time: 09/22/23  5:11 AM  Result Value Ref Range   WBC 7.7 4.0 - 10.5 K/uL   RBC 2.87 (L) 4.22 - 5.81 MIL/uL   Hemoglobin 9.2 (L) 13.0 - 17.0 g/dL   HCT 17.6 (L) 16.0 - 73.7 %   MCV 89.9 80.0 - 100.0 fL   MCH 32.1 26.0 - 34.0 pg   MCHC 35.7 30.0 - 36.0 g/dL   RDW 10.6 (H) 26.9 - 48.5 %   Platelets 53 (L) 150 - 400 K/uL    Comment: Immature Platelet Fraction may be clinically indicated, consider ordering this additional test [id number]    nRBC 0.5 (H) 0.0 - 0.2 %    Comment: Performed at [facility], [address] [place], [place] [address]  Osmolality     Status: Abnormal   Collection Time: 09/22/23  5:11 AM  Result Value Ref Range   Osmolality 252 (L) 275 - 295 mOsm/kg    Comment: REPEATED TO VERIFY Performed at [facility], [address],  [place], [place] [address]   Phosphorus     Status: None   Collection Time: 09/22/23  5:11 AM  Result Value Ref Range   Phosphorus 3.3 2.5 - 4.6 mg/dL    Comment: Performed at [facility],  [address] [place], [place] [address]  Magnesium     Status: Abnormal   Collection Time: 09/22/23  5:11 AM  Result Value Ref Range   Magnesium 1.3 (L) 1.7 - 2.4 mg/dL    Comment: Performed at [facility], [address] [place], [place] [address]  Basic metabolic panel     Status: Abnormal   Collection Time: 09/22/23  1:14 PM  Result Value Ref Range   Sodium 119 (LL) 135 - 145 mmol/L    Comment: CRITICAL RESULT CALLED TO, READ BACK BY AND VERIFIED WITH TAYLOR MASSEY 09/22/2023 AT 1336 SRR    Potassium 3.5 3.5 - 5.1 mmol/L   Chloride 90 (L) 98 - 111 mmol/L   CO2 20 (L) 22 - 32 mmol/L   Glucose, Bld 103 (H) 70 - 99 mg/dL    Comment: Glucose reference range applies only to samples taken after fasting for at least 8 hours.   BUN 10 6 - 20 mg/dL   Creatinine, Ser 0.98 0.61 - 1.24 mg/dL   Calcium 7.3 (L) 8.9 - 10.3 mg/dL   GFR, Estimated >11 >91 mL/min    Comment: (NOTE) Calculated using the CKD-EPI Creatinine Equation (2021)    Anion gap 9 5 - 15    Comment: Performed at [facility], [address], [place], [place] [address]   DG Chest 2 View  Result Date: 09/21/2023 CLINICAL DATA:  Suspected sepsis. Lung and liver cancer per patient. Vomiting. EXAM: CHEST - 2 VIEW COMPARISON:  Chest radiographs 09/09/2023 and 09/06/2023. Chest CT 07/16/2022. FINDINGS: Right IJ Port-A-Cath extends to the level of the superior cavoatrial junction, stable. The visualized heart size and mediastinal contours are stable. A large laterally loculated left pleural effusion appears slightly increased compared with the most recent prior study. Associated chronic compressive left lung atelectasis. No evidence of right-sided pleural effusion or pneumothorax. Mild right basilar atelectasis,  unchanged. The bones appear unchanged. IMPRESSION: Slight increase in size of a large laterally loculated left pleural effusion with associated chronic compressive left lung atelectasis. No other significant changes. Electronically Signed   By: Carey Bullocks M.D.   On: 09/21/2023 14:19    Pending Labs Unresulted Labs (From admission, onward)     Start     Ordered   09/23/23 0500  Magnesium  Daily,   R     Question:  Specimen collection method  Answer:  Lab=Lab collect   09/22/23 0756   09/23/23 0500  Phosphorus  Daily,   R     Question:  Specimen collection method  Answer:  Lab=Lab collect   09/22/23 0756   09/23/23 0500  CBC  Daily,   R      09/22/23 0756   09/23/23 0500  Hepatic function panel  Daily,   R      09/22/23 0756   09/22/23 1300  Basic metabolic panel  Now then every 8 hours,   TIMED      09/22/23 0748            Vitals/Pain Today's Vitals   09/22/23 0830 09/22/23 1030 09/22/23 1230 09/22/23 1558  BP: (!) 135/94 126/89 124/85   Pulse: (!) 117 (!) 115 (!) 115   Resp: 19 16 (!) 21   Temp:    98.2 F (36.8 C)  TempSrc:      SpO2: 100% 100% 100%   Weight:      Height:      PainSc:        Isolation  Precautions No active isolations  Medications Medications  sodium chloride flush (NS) 0.9 % injection 3 mL (3 mLs Intravenous Given 09/22/23 1254)  acetaminophen (TYLENOL) tablet 650 mg (has no administration in time range)    Or  acetaminophen (TYLENOL) suppository 650 mg (has no administration in time range)  ondansetron (ZOFRAN) tablet 4 mg (has no administration in time range)    Or  ondansetron (ZOFRAN) injection 4 mg (has no administration in time range)  polyethylene glycol (MIRALAX / GLYCOLAX) packet 17 g (has no administration in time range)  albumin human 5 % solution 12.5 g (12.5 g Intravenous Not Given 09/22/23 0730)  folic acid (FOLVITE) tablet 1 mg (1 mg Oral Given 09/22/23 1130)  OLANZapine (ZYPREXA) tablet 5 mg (5 mg Oral Given 09/21/23 2244)   sertraline (ZOLOFT) tablet 50 mg (50 mg Oral Given 09/22/23 1130)  methylPREDNISolone sodium succinate (SOLU-MEDROL) 40 mg/mL injection 40 mg (40 mg Intravenous Given 09/22/23 1253)  pantoprazole (PROTONIX) injection 40 mg (40 mg Intravenous Given 09/22/23 1254)  prochlorperazine (COMPAZINE) injection 10 mg (10 mg Intravenous Given 09/22/23 1744)  ipratropium-albuterol (DUONEB) 0.5-2.5 (3) MG/3ML nebulizer solution 3 mL (3 mLs Nebulization Given 09/22/23 1254)  fluticasone furoate-vilanterol (BREO ELLIPTA) 200-25 MCG/ACT 1 puff (1 puff Inhalation Not Given 09/22/23 1130)  furosemide (LASIX) 200 mg in dextrose 5 % 100 mL (2 mg/mL) infusion (4 mg/hr Intravenous New Bag/Given 09/22/23 1311)  albumin human 25 % solution 12.5 g (12.5 g Intravenous New Bag/Given 09/22/23 1745)  sodium chloride tablet 2 g (2 g Oral Given 09/22/23 1513)  sodium chloride 0.9 % bolus 1,000 mL (0 mLs Intravenous Stopped 09/21/23 1714)  ondansetron (ZOFRAN) injection 4 mg (4 mg Intravenous Given 09/21/23 1506)  sodium chloride 0.9 % bolus 1,000 mL (0 mLs Intravenous Stopped 09/21/23 1714)  ceFEPIme (MAXIPIME) 2 g in sodium chloride 0.9 % 100 mL IVPB (0 g Intravenous Stopped 09/21/23 1623)  metroNIDAZOLE (FLAGYL) IVPB 500 mg (0 mg Intravenous Stopped 09/21/23 1716)  vancomycin (VANCOREADY) IVPB 2000 mg/400 mL (0 mg Intravenous Stopped 09/21/23 1826)  magnesium sulfate IVPB 4 g 100 mL (0 g Intravenous Stopped 09/22/23 1122)    Mobility walks with person assist     Focused Assessments Cardiac Assessment Handoff:  Cardiac Rhythm: Sinus tachycardia Lab Results  Component Value Date   CKTOTAL 160 06/04/2022   No results found for: "DDIMER" Does the Patient currently have chest pain? No    R Recommendations: See Admitting Provider Note  Report given to:   Additional Notes: pt with some intermittent confusion. Family at bedside are extremely helpful with pt.

## 2023-09-23 ENCOUNTER — Inpatient Hospital Stay: Payer: 59

## 2023-09-23 ENCOUNTER — Encounter: Payer: Self-pay | Admitting: Nurse Practitioner

## 2023-09-23 ENCOUNTER — Encounter: Payer: Self-pay | Admitting: Internal Medicine

## 2023-09-23 DIAGNOSIS — R55 Syncope and collapse: Secondary | ICD-10-CM | POA: Diagnosis not present

## 2023-09-23 DIAGNOSIS — C3492 Malignant neoplasm of unspecified part of left bronchus or lung: Secondary | ICD-10-CM

## 2023-09-23 LAB — BASIC METABOLIC PANEL
Anion gap: 13 (ref 5–15)
Anion gap: 10 (ref 5–15)
Anion gap: 11 (ref 5–15)
BUN: 12 mg/dL (ref 6–20)
BUN: 12 mg/dL (ref 6–20)
BUN: 13 mg/dL (ref 6–20)
CO2: 21 mmol/L — ABNORMAL LOW (ref 22–32)
CO2: 21 mmol/L — ABNORMAL LOW (ref 22–32)
CO2: 23 mmol/L (ref 22–32)
Calcium: 7.5 mg/dL — ABNORMAL LOW (ref 8.9–10.3)
Calcium: 7.6 mg/dL — ABNORMAL LOW (ref 8.9–10.3)
Calcium: 7.8 mg/dL — ABNORMAL LOW (ref 8.9–10.3)
Chloride: 87 mmol/L — ABNORMAL LOW (ref 98–111)
Chloride: 88 mmol/L — ABNORMAL LOW (ref 98–111)
Chloride: 89 mmol/L — ABNORMAL LOW (ref 98–111)
Creatinine, Ser: 0.78 mg/dL (ref 0.61–1.24)
Creatinine, Ser: 0.78 mg/dL (ref 0.61–1.24)
Creatinine, Ser: 0.94 mg/dL (ref 0.61–1.24)
GFR, Estimated: >60 mL/min (ref >60)
GFR, Estimated: >60 mL/min (ref >60)
GFR, Estimated: >60 mL/min (ref >60)
Glucose, Bld: 123 mg/dL — ABNORMAL HIGH (ref 70–99)
Glucose, Bld: 142 mg/dL — ABNORMAL HIGH (ref 70–99)
Glucose, Bld: 147 mg/dL — ABNORMAL HIGH (ref 70–99)
Potassium: 2.9 mmol/L — ABNORMAL LOW (ref 3.5–5.1)
Potassium: 3 mmol/L — ABNORMAL LOW (ref 3.5–5.1)
Potassium: 3.1 mmol/L — ABNORMAL LOW (ref 3.5–5.1)
Sodium: 121 mmol/L — ABNORMAL LOW (ref 135–145)
Sodium: 121 mmol/L — ABNORMAL LOW (ref 135–145)
Sodium: 121 mmol/L — ABNORMAL LOW (ref 135–145)

## 2023-09-23 LAB — HEPATIC FUNCTION PANEL
ALT: 63 U/L — ABNORMAL HIGH (ref 0–44)
ALT: 62 U/L — ABNORMAL HIGH (ref 0–44)
AST: 48 U/L — ABNORMAL HIGH (ref 15–41)
AST: 51 U/L — ABNORMAL HIGH (ref 15–41)
Albumin: 2.8 g/dL — ABNORMAL LOW (ref 3.5–5.0)
Albumin: 3 g/dL — ABNORMAL LOW (ref 3.5–5.0)
Alkaline Phosphatase: 227 U/L — ABNORMAL HIGH (ref 38–126)
Alkaline Phosphatase: 227 U/L — ABNORMAL HIGH (ref 38–126)
Bilirubin, Direct: 0.6 mg/dL — ABNORMAL HIGH (ref 0.0–0.2)
Bilirubin, Direct: 0.6 mg/dL — ABNORMAL HIGH (ref 0.0–0.2)
Indirect Bilirubin: 0.9 mg/dL (ref 0.3–0.9)
Indirect Bilirubin: 0.9 mg/dL (ref 0.3–0.9)
Total Bilirubin: 1.5 mg/dL — ABNORMAL HIGH (ref <1.2)
Total Bilirubin: 1.5 mg/dL — ABNORMAL HIGH (ref <1.2)
Total Protein: 5 g/dL — ABNORMAL LOW (ref 6.5–8.1)
Total Protein: 5.1 g/dL — ABNORMAL LOW (ref 6.5–8.1)

## 2023-09-23 LAB — PHOSPHORUS: Phosphorus: 3.9 mg/dL (ref 2.5–4.6)

## 2023-09-23 LAB — CBC
HCT: 24.3 % — ABNORMAL LOW (ref 39.0–52.0)
Hemoglobin: 8.9 g/dL — ABNORMAL LOW (ref 13.0–17.0)
MCH: 33 pg (ref 26.0–34.0)
MCHC: 36.6 g/dL — ABNORMAL HIGH (ref 30.0–36.0)
MCV: 90 fL (ref 80.0–100.0)
Platelets: 61 10*3/uL — ABNORMAL LOW (ref 150–400)
RBC: 2.7 MIL/uL — ABNORMAL LOW (ref 4.22–5.81)
RDW: 16.3 % — ABNORMAL HIGH (ref 11.5–15.5)
WBC: 7.7 10*3/uL (ref 4.0–10.5)
nRBC: 0 % (ref 0.0–0.2)

## 2023-09-23 LAB — SODIUM: Sodium: 120 mmol/L — ABNORMAL LOW (ref 135–145)

## 2023-09-23 LAB — MAGNESIUM: Magnesium: 1.5 mg/dL — ABNORMAL LOW (ref 1.7–2.4)

## 2023-09-23 MED ORDER — METOPROLOL TARTRATE 25 MG PO TABS
12.5000 mg | ORAL_TABLET | Freq: Two times a day (BID) | ORAL | Status: DC
  Administered 2023-09-23 – 2023-09-25 (×5): 12.5 mg via ORAL
  Filled 2023-09-23 (×5): qty 1

## 2023-09-23 MED ORDER — POTASSIUM CHLORIDE 10 MEQ/100ML IV SOLN
10.0000 meq | INTRAVENOUS | Status: AC
  Administered 2023-09-23 – 2023-09-24 (×6): 10 meq via INTRAVENOUS
  Filled 2023-09-23 (×6): qty 100

## 2023-09-23 MED ORDER — TOLVAPTAN 15 MG PO TABS
15.0000 mg | ORAL_TABLET | Freq: Once | ORAL | Status: AC
  Administered 2023-09-23: 15 mg via ORAL
  Filled 2023-09-23: qty 1

## 2023-09-23 MED ORDER — POTASSIUM CHLORIDE 10 MEQ/100ML IV SOLN
10.0000 meq | INTRAVENOUS | Status: AC
  Administered 2023-09-23 (×4): 10 meq via INTRAVENOUS
  Filled 2023-09-23 (×4): qty 100

## 2023-09-23 MED ORDER — POTASSIUM CHLORIDE CRYS ER 20 MEQ PO TBCR
40.0000 meq | EXTENDED_RELEASE_TABLET | Freq: Once | ORAL | Status: AC
  Administered 2023-09-23: 40 meq via ORAL
  Filled 2023-09-23: qty 2

## 2023-09-23 MED ORDER — LIDOCAINE HCL (PF) 1 % IJ SOLN
20.0000 mL | Freq: Once | INTRAMUSCULAR | Status: AC
  Administered 2023-09-23: 20 mL via INTRADERMAL
  Filled 2023-09-23: qty 20

## 2023-09-23 MED ORDER — CHLORHEXIDINE GLUCONATE CLOTH 2 % EX PADS
6.0000 | MEDICATED_PAD | Freq: Every day | CUTANEOUS | Status: DC
  Administered 2023-09-23 – 2023-09-26 (×4): 6 via TOPICAL

## 2023-09-23 MED ORDER — MAGNESIUM SULFATE 4 GM/100ML IV SOLN
4.0000 g | Freq: Once | INTRAVENOUS | Status: AC
  Administered 2023-09-23: 4 g via INTRAVENOUS
  Filled 2023-09-23: qty 100

## 2023-09-23 NOTE — Consult Note (Signed)
[facility] CONSULT NOTE  Patient Care Team: Pcp, No as PCP - General [name], RN as Oncology Nurse Navigator [name], MD as Consulting Physician (Internal Medicine)  CHIEF COMPLAINTS/PURPOSE OF CONSULTATION:  lung cancer  HISTORY OF PRESENTING ILLNESS:  [name] 42 y.o.  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  Patient most recently has been on carboplatinum-Alimta/amivantimab.-Finished approximately 3 weeks ago.  On the day of admission patient had episode of syncope-due to orthostatic.  Of note patient had recent platelet transfusion for platelet count 18,000 [as he is on Eliquis].  Patient was also awaiting to go to [facility] for second opinion, which obviously has to be held because of admission.  On admission to hospital patient noted to have a sodium of 118; severe hypokalemia.  Patient patient's hemoglobin was around 10 platelets in the 50s and 60s.  Chest x-ray showed loculated pleural effusion.  Patient status post paracentesis.  Patient has been evaluated by nephrology.  Patient is on ADH antagonist and IV fluids.  Currently accompanied by his wife and father.   Review of Systems  Constitutional:  Positive for malaise/fatigue and weight loss. Negative for chills, diaphoresis and fever.  HENT:  Negative for nosebleeds and sore throat.   Eyes:  Negative for double vision.  Respiratory:  Positive for cough and shortness of breath. Negative for hemoptysis, sputum production and wheezing.   Cardiovascular:  Positive for leg swelling. Negative for chest pain, palpitations and orthopnea.  Gastrointestinal:  Positive for abdominal pain and nausea. Negative for blood in stool, constipation, diarrhea, heartburn, melena and vomiting.  Genitourinary:  Negative for dysuria, frequency and urgency.   Musculoskeletal:  Negative for back pain and joint pain.  Skin: Negative.  Negative for itching and rash.  Neurological:  Positive for dizziness and weakness. Negative for tingling, focal weakness and headaches.  Endo/Heme/Allergies:  Bruises/bleeds easily.  Psychiatric/Behavioral:  Negative for depression. The patient is not nervous/anxious and does not have insomnia.     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    SURGICAL HISTORY: Past Surgical History:  Procedure Laterality Date   FRACTURE SURGERY     broke femur when he was 6 years   PORTA CATH INSERTION N/A 03/28/2021   Procedure: PORTA CATH INSERTION;  Surgeon: Renford Dills, MD;  Location: [facility] INVASIVE CV LAB;  Service: Cardiovascular;  Laterality: N/A;   VIDEO BRONCHOSCOPY WITH ENDOBRONCHIAL NAVIGATION N/A 03/15/2021   Procedure: VIDEO BRONCHOSCOPY WITH ENDOBRONCHIAL NAVIGATION;  Surgeon: Vida Rigger, MD;  Location: [facility] ORS;  Service: Thoracic;  Laterality: N/A;   VIDEO BRONCHOSCOPY WITH ENDOBRONCHIAL ULTRASOUND N/A 03/15/2021   Procedure: VIDEO BRONCHOSCOPY WITH ENDOBRONCHIAL ULTRASOUND;  Surgeon: Vida Rigger, MD;  Location: [facility] ORS;  Service: Thoracic;  Laterality: N/A;    SOCIAL HISTORY: Social History   Socioeconomic History   Marital status: Married    Spouse name: Darl Pikes   Number of children: 2   Years of education: Not on file   Highest education level: Not on file  Occupational History   Not on file  Tobacco Use   Smoking status: Never   Smokeless tobacco: Former    Types: Snuff, Chew  Vaping Use  Vaping status: Never Used  Substance and Sexual Activity   Alcohol use: Never   Drug use: Never   Sexual activity: Yes  Other Topics Concern   Not on file  Social History Narrative   [place]; with wife; 2 daughters[12 and 22]; never smoked; rare alcohol.  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 Dog and cat.   Social Determinants of Health   Financial Resource Strain: Low Risk  (05/10/2022)   Overall Financial Resource Strain (CARDIA)    Difficulty of Paying Living Expenses: Not hard at all  Food Insecurity: No Food Insecurity (09/23/2023)   Hunger Vital Sign    Worried About Running Out of Food in the Last Year: Never true    Ran Out of Food in the Last Year: Never true  Transportation Needs: No Transportation Needs (09/23/2023)   PRAPARE - Administrator, Civil Service (Medical): No    Lack of Transportation (Non-Medical): No  Physical Activity: Not on file  Stress: Not on file  Social Connections: Socially Integrated (05/10/2022)   Social Connection and Isolation Panel [NHANES]    Frequency of Communication with Friends and Family: More than three times a week    Frequency of Social Gatherings with Friends and Family: More than three times a week    Attends Religious Services: More than 4 times per year    Active Member of [employer] or Organizations: Yes    Attends Banker Meetings: More than 4 times per year    Marital Status: Married  Catering manager Violence: Unknown (09/23/2023)   Humiliation, Afraid, Rape, and Kick questionnaire    Fear of Current or Ex-Partner: Not on file    Emotionally Abused: No    Physically Abused: No    Sexually Abused: No    FAMILY HISTORY: Family History  Problem Relation Age of Onset   High Cholesterol Mother    Hypertension Father    Lung cancer Father    Skin cancer Father     ALLERGIES:  is allergic to codeine.  MEDICATIONS:  Current Facility-Administered Medications  Medication Dose Route Frequency Provider Last Rate Last Admin   acetaminophen (TYLENOL) tablet 650 mg  650 mg Oral Q6H PRN Verdene Lennert, MD       Or   acetaminophen (TYLENOL) suppository 650 mg  650 mg Rectal Q6H PRN Verdene Lennert, MD       albumin human 25 % solution 12.5 g  12.5 g Intravenous Q6H Gillis Santa, MD 60  mL/hr at 09/23/23 1748 12.5 g at 09/23/23 1748   Chlorhexidine Gluconate Cloth 2 % PADS 6 each  6 each Topical Daily Jawo, Modou L, NP   6 each at 09/23/23 1750   fluticasone furoate-vilanterol (BREO ELLIPTA) 200-25 MCG/ACT 1 puff  1 puff Inhalation Daily Gillis Santa, MD   1 puff at 09/23/23 1144   folic acid (FOLVITE) tablet 1 mg  1 mg Oral Daily Verdene Lennert, MD   1 mg at 09/22/23 1130   ipratropium-albuterol (DUONEB) 0.5-2.5 (3) MG/3ML nebulizer solution 3 mL  3 mL Nebulization Q6H Gillis Santa, MD   3 mL at 09/23/23 1409   methylPREDNISolone sodium succinate (SOLU-MEDROL) 40 mg/mL injection 40 mg  40 mg Intravenous Q12H Gillis Santa, MD   40 mg at 09/23/23 1148   metoprolol tartrate (LOPRESSOR) tablet 12.5 mg  12.5 mg Oral BID Gillis Santa, MD   12.5 mg at 09/23/23 1745   ondansetron (ZOFRAN) tablet 4 mg  4 mg Oral Q6H PRN Huel Cote,  Cira Servant, MD       Or   ondansetron [facility]) injection 4 mg  4 mg Intravenous Q6H PRN Verdene Lennert, MD   4 mg at 09/23/23 1743   pantoprazole (PROTONIX) injection 40 mg  40 mg Intravenous Q12H Gillis Santa, MD   40 mg at 09/23/23 0859   polyethylene glycol (MIRALAX / GLYCOLAX) packet 17 g  17 g Oral Daily PRN Verdene Lennert, MD       potassium chloride 10 mEq in 100 mL IVPB  10 mEq Intravenous Q1 Hr x 6 Gillis Santa, MD 100 mL/hr at 09/23/23 1933 10 mEq at 09/23/23 1933   sodium chloride flush (NS) 0.9 % injection 3 mL  3 mL Intravenous Q12H Verdene Lennert, MD   3 mL at 09/23/23 9528   sodium chloride tablet 2 g  2 g Oral TID Gillis Santa, MD   2 g at 09/23/23 1744   Facility-Administered Medications Ordered in Other Encounters  Medication Dose Route Frequency Provider Last Rate Last Admin   heparin lock flush 100 UNIT/ML injection            heparin lock flush 100 UNIT/ML injection            heparin lock flush 100 unit/mL  500 Units Intravenous Once Borders, Ivin Booty R, NP       sodium chloride flush (NS) 0.9 % injection 10 mL  10 mL Intravenous PRN  Louretta Shorten R, MD   10 mL at 07/28/21 0852   sodium chloride flush (NS) 0.9 % injection 10 mL  10 mL Intravenous Once Borders, Joshua R, NP        PHYSICAL EXAMINATION:   Vitals:   09/23/23 1141 09/23/23 1423  BP: 125/87 129/85  Pulse: (!) 114 (!) 120  Resp: 18 15  Temp: 97.8 F (36.6 C) (!) 97.2 F (36.2 C)  SpO2: 98% 100%   Filed Weights   09/21/23 1340  Weight: 190 lb (86.2 kg)    Physical Exam Vitals and nursing note reviewed.  HENT:     Head: Normocephalic and atraumatic.     Mouth/Throat:     Pharynx: Oropharynx is clear.  Eyes:     Extraocular Movements: Extraocular movements intact.     Pupils: Pupils are equal, round, and reactive to light.  Cardiovascular:     Rate and Rhythm: Normal rate and regular rhythm.  Pulmonary:     Comments: Decreased breath sounds bilaterally.  Abdominal:     Palpations: Abdomen is soft.  Musculoskeletal:        General: Normal range of motion.     Cervical back: Normal range of motion.  Skin:    General: Skin is warm.  Neurological:     General: No focal deficit present.     Mental Status: He is alert and oriented to person, place, and time.  Psychiatric:        Behavior: Behavior normal.        Judgment: Judgment normal.     LABORATORY DATA:  I have reviewed the data as listed Lab Results  Component Value Date   WBC 7.7 09/23/2023   HGB 8.9 (L) 09/23/2023   HCT 24.3 (L) 09/23/2023   MCV 90.0 09/23/2023   PLT 61 (L) 09/23/2023   Recent Labs    08/15/23 0821 08/22/23 0853 09/21/23 1407 09/21/23 2104 09/23/23 0500 09/23/23 1342 09/23/23 1356 09/23/23 1730  NA 134*   < > 122*   < > 121* 121* 121* 120*  K  3.5   < > 4.2   < > 2.9* 3.1* 3.0*  --   CL 101   < > 90*   < > 87* 88* 89*  --   CO2 26   < > 18*   < > 21* 23 21*  --   GLUCOSE 107*   < > 147*   < > 123* 147* 142*  --   BUN 11   < > 11   < > 12 13 12   --   CREATININE 0.55*   < > 1.00   < > 0.78 0.94 0.78  --   CALCIUM 8.4*   < > 7.6*   < >  7.5* 7.6* 7.8*  --   GFRNONAA >60   < > >60   < > >60 >60 >60  --   PROT 6.1*   < > 5.0*  --  5.0*  --  5.1*  --   ALBUMIN 2.8*   < > 1.9*  --  2.8*  --  3.0*  --   AST 140*   < > 75*  --  48*  --  51*  --   ALT 109*   < > 100*  --  63*  --  62*  --   ALKPHOS 333*   < > 315*  --  227*  --  227*  --   BILITOT 2.3*  2.3*   < > 2.1*  --  1.5*  --  1.5*  --   BILIDIR 0.9*  --   --   --  0.6*  --  0.6*  --   IBILI 1.4*  --   --   --  0.9  --  0.9  --    < > = values in this interval not displayed.    RADIOGRAPHIC STUDIES: I have personally reviewed the radiological images as listed and agreed with the findings in the report. US THORACENTESIS ASP PLEURAL SPACE W/IMG GUIDE  Result Date: 09/23/2023 INDICATION: Patient with a history of lung cancer with recurrent pleural effusions. Interventional radiology asked to perform a therapeutic thoracentesis. EXAM: ULTRASOUND GUIDED THORACENTESIS MEDICATIONS: 1% lidocaine 15 mL COMPLICATIONS: None immediate. PROCEDURE: An ultrasound guided thoracentesis was thoroughly discussed with the patient and questions answered. The benefits, risks, alternatives and complications were also discussed. The patient understands and wishes to proceed with the procedure. Written consent was obtained. Ultrasound was performed to localize and mark an adequate pocket of fluid in the left chest. The area was then prepped and draped in the normal sterile fashion. 1% Lidocaine was used for local anesthesia. Under ultrasound guidance a 6 Fr Safe-T-Centesis catheter was introduced. Thoracentesis was performed. The catheter was removed and a dressing applied. FINDINGS: A total of approximately 450 mL of dark red fluid was removed. IMPRESSION: Successful ultrasound guided left thoracentesis yielding 450 mL of pleural fluid. Procedure performed by Alwyn Ren NP Electronically Signed   By: Judie Petit.  Shick M.D.   On: 09/23/2023 11:02   DG Chest Port 1 View  Result Date: 09/23/2023 CLINICAL  DATA:  Status post left thoracentesis. EXAM: PORTABLE CHEST 1 VIEW COMPARISON:  September 21, 2023. FINDINGS: No pneumothorax is noted status post left thoracentesis. Left pleural effusion is slightly decreased compared to prior exam. IMPRESSION: No pneumothorax status post left thoracentesis. Electronically Signed   By: Lupita Raider M.D.   On: 09/23/2023 10:54   DG Chest 2 View  Result Date: 09/21/2023 CLINICAL DATA:  Suspected sepsis. Lung and  liver cancer per patient. Vomiting. EXAM: CHEST - 2 VIEW COMPARISON:  Chest radiographs 09/09/2023 and 09/06/2023. Chest CT 07/16/2022. FINDINGS: Right IJ Port-A-Cath extends to the level of the superior cavoatrial junction, stable. The visualized heart size and mediastinal contours are stable. A large laterally loculated left pleural effusion appears slightly increased compared with the most recent prior study. Associated chronic compressive left lung atelectasis. No evidence of right-sided pleural effusion or pneumothorax. Mild right basilar atelectasis, unchanged. The bones appear unchanged. IMPRESSION: Slight increase in size of a large laterally loculated left pleural effusion with associated chronic compressive left lung atelectasis. No other significant changes. Electronically Signed   By: Carey Bullocks M.D.   On: 09/21/2023 14:19   US Paracentesis  Result Date: 09/17/2023 INDICATION: Metastatic lung cancer and malignant ascites. Request for therapeutic paracentesis. EXAM: ULTRASOUND GUIDED PARACENTESIS MEDICATIONS: 1% lidocaine 10 mL COMPLICATIONS: None immediate. PROCEDURE: Informed written consent was obtained from the patient after a discussion of the risks, benefits and alternatives to treatment. A timeout was performed prior to the initiation of the procedure. Initial ultrasound scanning demonstrates a large amount of ascites within the right lateral abdomen. The right lateral abdomen was prepped and draped in the usual sterile fashion. 1% lidocaine  was used for local anesthesia. Following this, a 19 gauge, 7-cm, Yueh catheter was introduced. An ultrasound image was saved for documentation purposes. The paracentesis was performed. The catheter was removed and a dressing was applied. The patient tolerated the procedure well without immediate post procedural complication. FINDINGS: A total of approximately 5.6 L of clear yellow fluid was removed. IMPRESSION: Successful ultrasound-guided paracentesis yielding 5.6 liters of peritoneal fluid. Electronically Signed   By: Gilmer Mor D.O.   On: 09/17/2023 12:03   DG Chest Port 1 View  Result Date: 09/09/2023 CLINICAL DATA:  Left lower lobe lung cancer. Status post left thoracentesis. EXAM: PORTABLE CHEST 1 VIEW COMPARISON:  09/06/2023 FINDINGS: Power injectable Port-A-Cath tip: Cavoatrial junction. Minimal increase in aeration in the left lung, large left pleural effusion observed. No pneumothorax or radiographic evidence of procedure complication. The right lung remains clear. Heart size within normal limits although the left apical border remains obscured. IMPRESSION: 1. Minimal increase in aeration in the left lung, large left pleural effusion observed. No pneumothorax or radiographic evidence of procedure complication. Electronically Signed   By: Gaylyn Rong M.D.   On: 09/09/2023 14:38   US THORACENTESIS ASP PLEURAL SPACE W/IMG GUIDE  Result Date: 09/09/2023 INDICATION: Patient with history of left lung cancer with recurrent left-sided pleural effusion. Request is for therapeutic thoracentesis EXAM: ULTRASOUND GUIDED THERAPEUTIC LEFT-SIDED THORACENTESIS MEDICATIONS: Lidocaine 1% 10 mL COMPLICATIONS: None immediate. PROCEDURE: An ultrasound guided thoracentesis was thoroughly discussed with the patient and questions answered. The benefits, risks, alternatives and complications were also discussed. The patient understands and wishes to proceed with the procedure. Written consent was obtained.  Ultrasound was performed to localize and mark an adequate pocket of fluid in the left chest. The area was then prepped and draped in the normal sterile fashion. 1% Lidocaine was used for local anesthesia. Under ultrasound guidance a 6 Fr Safe-T-Centesis catheter was introduced. Thoracentesis was performed. The catheter was removed and a dressing applied. FINDINGS: A total of approximately 450 mL of reddish brown fluid was removed. Samples were sent to the laboratory as requested by the clinical team. IMPRESSION: Successful ultrasound guided therapeutic left-sided thoracentesis yielding 450 mL of pleural fluid. Performed by Anders Grant NP Electronically Signed   By: Judie Petit.  Shick  M.D.   On: 09/09/2023 13:58   DG Chest 2 View  Result Date: 09/06/2023 CLINICAL DATA:  Cough and shortness of breath. History of lung cancer. EXAM: CHEST - 2 VIEW COMPARISON:  Chest x-ray dated August 13, 2023. FINDINGS: Unchanged right chest wall port catheter. Stable cardiomediastinal silhouette with normal heart size. Unchanged chronic loculated large left pleural effusion with atelectasis in the lingula and left lower lobe. Slightly increased interstitial thickening in the right lung. No pneumothorax. No acute osseous abnormality. IMPRESSION: 1. Slightly increased interstitial thickening in the right lung, which may reflect atypical infection or pulmonary edema. 2. Unchanged chronic loculated large left pleural effusion. Electronically Signed   By: Obie Dredge M.D.   On: 09/06/2023 17:11   US Paracentesis  Result Date: 09/06/2023 INDICATION: malignant ascites EXAM: ULTRASOUND GUIDED  PARACENTESIS MEDICATIONS: None. COMPLICATIONS: None immediate. PROCEDURE: Informed written consent was obtained from the patient after a discussion of the risks, benefits and alternatives to treatment. A timeout was performed prior to the initiation of the procedure. Initial ultrasound scanning demonstrates a large amount of ascites within  the right lower abdominal quadrant. The right lower abdomen was prepped and draped in the usual sterile fashion. 1% lidocaine was used for local anesthesia. Following this, a 19 gauge, 7-cm, Yueh catheter was introduced. An ultrasound image was saved for documentation purposes. The paracentesis was performed. The catheter was removed and a dressing was applied. The patient tolerated the procedure well without immediate post procedural complication. FINDINGS: A total of approximately 4.8 L of clear, straw-colored fluid was removed. IMPRESSION: Successful ultrasound-guided paracentesis yielding 4.8 liters of peritoneal fluid. Electronically Signed   By: Olive Bass M.D.   On: 09/06/2023 16:34     # 42 year old Caucasian male patient with history of metastatic adenocarcinoma of the lung-is currently admitted to hospital for syncopal episode; noted to have severe hyponatremia hypokalemia.  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  # Syncopal episode-multifactorial hyponatremia/intravascular dehydration.  Clinically less likely chance of symptomatic brain mets.  # Acute on chronic hyponatremia-dehydration/[third spacing-secondary to hypoalbuminemia/cirrhosis] status post evaluation with nephrology on ADH Antagonist.   # Malignant ascites-status post paracentesis  # Malignant left-sided pleural effusion loculated status post paracentesis  # Severe hypokalemia  # DNR/DNI  Recommendations/plan:  # I had a long discussion the patient and family regarding the ongoing toxicity of the chemotherapy/and lack of bone marrow post- chemotherapy.  No obvious clinical evidence of response to chemotherapy so far.  Would recommend repeating a CT scan chest and pelvis once clinically stable to assess response to therapy.  # However given the history of brain metastases-recommend MRI of  the brain with and without contrast was clinically stable.  # I discussed with patient and family at length regarding overall poor prognosis given his declining performance status and clinical absence of any significant meaningful improvement in his clinical status of chemotherapy.  Patient unable to keep up with appointment at [facility] because of admission.  I discussed the unlikely benefit of going to [facility] as he may not be a candidate for clinical trial given his performance status and also organ dysfunction.  However patient feels that he would want to reach out to [facility] for consideration of clinical trials/novel therapies.   Thank you Dr. Lucianne Muss for allowing me to participate in the care of your pleasant patient. Please do not hesitate to contact me with  questions or concerns in the interim. Discussed with Dr.Kumar.   Above plan of care was discussed with patient/family in detail.  My contact information was given to the patient/family.       [name], MD 09/23/2023 7:48 PM

## 2023-09-23 NOTE — Procedures (Signed)
PROCEDURE SUMMARY:  Successful US guided left thoracentesis. Yielded 450 ml of dark red fluid. Pt tolerated procedure well. No immediate complications.  Specimen not sent for labs. CXR ordered; no post-procedure pneumothorax identified.   EBL < 2 mL  Mickie Kay, NP 09/23/2023 10:54 AM

## 2023-09-23 NOTE — TOC Initial Note (Signed)
Transition of Care [facility]) - Initial/Assessment Note    Patient Details  Name: [name] MRN: [id number] Date of Birth: [birth year]  Transition of Care [facility]) CM/SW Contact:    Colette Ribas, LCSWA Phone Number: 09/23/2023, 3:26 PM  Clinical Narrative:                  CSW spoke with patient Has not used HH, No previous SNF stay, He has recently has Oxygen concentrator-Ordered, but he is unsure from what company. He does not have an PCP but he is open to receiving resources for a PCP. I advised I will provide him with this list. [facility] [address], He has transportation and lives with spouse.       Patient Goals and CMS Choice            Expected Discharge Plan and Services                                              Prior Living Arrangements/Services                       Activities of Daily Living   ADL Screening (condition at time of admission) Independently performs ADLs?: Yes (appropriate for developmental age) Is the patient deaf or have difficulty hearing?: No Does the patient have difficulty seeing, even when wearing glasses/contacts?: No Does the patient have difficulty concentrating, remembering, or making decisions?: No  Permission Sought/Granted                  Emotional Assessment              Admission diagnosis:  Syncope and collapse [R55] Dehydration [E86.0] Hyponatremia [E87.1] Syncope [R55] Nausea and vomiting, unspecified vomiting type [R11.2] Patient Active Problem List   Diagnosis Date Noted   Syncope 09/21/2023   Adenocarcinoma of lung, stage 4, left (HCC) 09/21/2023   Lactic acidosis 09/21/2023   AKI (acute kidney injury) (HCC) 09/21/2023   Ascites 09/21/2023   Intractable nausea and vomiting 09/14/2023   Essential hypertension 09/14/2023   Depression 09/14/2023   Adjustment disorder, unspecified 08/15/2023   Goals of care, counseling/discussion 10/02/2022   Encounter for monoclonal  antibody treatment for malignancy 10/02/2022   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   Malignant neoplasm of lung (HCC) 05/15/2021   Malignant neoplasm metastatic to brain [facility])    Loculated pleural effusion    Palliative care encounter    SVT (supraventricular tachycardia) (HCC) 03/28/2021   WPW (Wolff-Parkinson-White syndrome) 03/28/2021   Hyponatremia 03/28/2021   Transaminitis 03/28/2021   B12 deficiency 03/24/2021   Cancer of lower lobe of left lung (HCC) 03/23/2021   PCP:  Pcp, No Pharmacy:   [id number] [place], [place] - [address] AT [facility] [address] [place] [address] Phone: [phone] Fax: [phone]  [facility] - [facility] [address] [place] [address] Phone: [phone] Fax: [phone]  [facility] - [place], [place] - [address] [facility] [address] [place] [address] Phone: [phone] Fax: [phone]  [facility] - [facility] [address] [place] [address] Phone: [phone] Fax: [phone]     Social Determinants of Health (SDOH) Social History: SDOH Screenings   Food Insecurity: No Food Insecurity (09/23/2023)  Housing: Patient Declined (09/23/2023)  Transportation Needs: No Transportation Needs (09/23/2023)  Utilities: Not At Risk (09/23/2023)  Financial Resource Strain: Low Risk  (05/10/2022)  Social Connections: Socially Integrated (05/10/2022)  Tobacco Use: Medium Risk (09/21/2023)   SDOH Interventions:     Readmission Risk Interventions     No data to display

## 2023-09-23 NOTE — Progress Notes (Signed)
[facility]  ROUNDING NOTE   Subjective:   Patient seen laying in bed Alert and oriented Family at bedside  Lower extremity edema present   Objective:  Vital signs in last 24 hours:  Temp:  [97.5 F (36.4 C)-98.2 F (36.8 C)] 97.8 F (36.6 C) (12/09 1141) Pulse Rate:  [114-127] 114 (12/09 1141) Resp:  [16-22] 18 (12/09 1141) BP: (109-126)/(67-90) 125/87 (12/09 1141) SpO2:  [98 %-100 %] 98 % (12/09 1141)  Weight change:  Filed Weights   09/21/23 1340  Weight: 86.2 kg    Intake/Output: I/O last 3 completed shifts: In: 709.3 [P.O.:180; I.V.:29.3; IV Piggyback:500] Out: -    Intake/Output this shift:  Total I/O In: 120 [P.O.:120] Out: -   Physical Exam: General: NAD  Head: Normocephalic, atraumatic. Moist oral mucosal membranes  Eyes: Anicteric  Lungs:  Clear to auscultation, normal effort  Heart: Regular rate and rhythm  Abdomen:  Soft, nontender  Extremities:  2+  peripheral edema.  Neurologic: Alert, moving all four extremities  Skin: No lesions       Basic Metabolic Panel: Recent Labs  Lab 09/21/23 1405 09/21/23 1407 09/21/23 2104 09/22/23 0511 09/22/23 1314 09/22/23 2150 09/23/23 0500  NA  --    < > 122* 119* 119* 119* 121*  K  --    < > 3.7 3.6 3.5 3.0* 2.9*  CL  --    < > 92* 90* 90* 88* 87*  CO2  --    < > 24 21* 20* 23 21*  GLUCOSE  --    < > 104* 89 103* 142* 123*  BUN  --    < > 11 10 10 12 12   CREATININE  --    < > 0.84 0.82 0.74 0.83 0.78  CALCIUM  --    < > 7.0* 7.4* 7.3* 7.2* 7.5*  MG 1.3*  --   --  1.3*  --   --  1.5*  PHOS  --   --   --  3.3  --   --  3.9   < > = values in this interval not displayed.    Liver Function Tests: Recent Labs  Lab 09/17/23 1036 09/19/23 0822 09/21/23 1407 09/23/23 0500  AST 69* 75* 75* 48*  ALT 80* 87* 100* 63*  ALKPHOS 234* 239* 315* 227*  BILITOT 2.2* 1.2* 2.1* 1.5*  PROT 4.6* 4.4* 5.0* 5.0*  ALBUMIN 1.8* 1.6* 1.9* 2.8*   No results for input(s): "LIPASE", "AMYLASE" in  the last 168 hours. No results for input(s): "AMMONIA" in the last 168 hours.  CBC: Recent Labs  Lab 09/17/23 1036 09/19/23 0822 09/21/23 1407 09/22/23 0511 09/23/23 0500  WBC 2.7* 1.8* 7.4 7.7 7.7  NEUTROABS 1.5* 0.6* 4.7  --   --   HGB 10.4* 10.3* 12.2* 9.2* 8.9*  HCT 29.6* 29.3* 34.8* 25.8* 24.3*  MCV 91.1 90.2 92.8 89.9 90.0  PLT 23* 18* 59* 53* 61*    Cardiac Enzymes: No results for input(s): "CKTOTAL", "CKMB", "CKMBINDEX", "TROPONINI" in the last 168 hours.  BNP: Invalid input(s): "POCBNP"  CBG: No results for input(s): "GLUCAP" in the last 168 hours.  Microbiology: Results for orders placed or performed during the hospital encounter of 09/21/23  Culture, blood (Routine x 2)     Status: None (Preliminary result)   Collection Time: 09/21/23  2:19 PM   Specimen: BLOOD  Result Value Ref Range Status   Specimen Description BLOOD PORTA CATH  Final   Special Requests  Final    BOTTLES DRAWN AEROBIC AND ANAEROBIC Blood Culture results may not be optimal due to an inadequate volume of blood received in culture bottles   Culture   Final    NO GROWTH 2 DAYS Performed at [facility], [address] [place], [place] [address]    Report Status PENDING  Incomplete  Culture, blood (Routine x 2)     Status: None (Preliminary result)   Collection Time: 09/21/23  2:19 PM   Specimen: BLOOD LEFT ARM  Result Value Ref Range Status   Specimen Description BLOOD LEFT ARM  Final   Special Requests   Final    BOTTLES DRAWN AEROBIC AND ANAEROBIC Blood Culture adequate volume   Culture   Final    NO GROWTH 2 DAYS Performed at [facility], [address] [place], [place] [address]    Report Status PENDING  Incomplete    Coagulation Studies: Recent Labs    09/21/23 1609  LABPROT 20.9*  INR 1.8*    Urinalysis: Recent Labs    09/21/23 2007  COLORURINE AMBER*  LABSPEC 1.017  PHURINE 5.0  GLUCOSEU NEGATIVE  HGBUR NEGATIVE  BILIRUBINUR NEGATIVE   KETONESUR NEGATIVE  PROTEINUR NEGATIVE  NITRITE NEGATIVE  LEUKOCYTESUR NEGATIVE      Imaging: US THORACENTESIS ASP PLEURAL SPACE W/IMG GUIDE  Result Date: 09/23/2023 INDICATION: Patient with a history of lung cancer with recurrent pleural effusions. Interventional radiology asked to perform a therapeutic thoracentesis. EXAM: ULTRASOUND GUIDED THORACENTESIS MEDICATIONS: 1% lidocaine 15 mL COMPLICATIONS: None immediate. PROCEDURE: An ultrasound guided thoracentesis was thoroughly discussed with the patient and questions answered. The benefits, risks, alternatives and complications were also discussed. The patient understands and wishes to proceed with the procedure. Written consent was obtained. Ultrasound was performed to localize and mark an adequate pocket of fluid in the left chest. The area was then prepped and draped in the normal sterile fashion. 1% Lidocaine was used for local anesthesia. Under ultrasound guidance a 6 Fr Safe-T-Centesis catheter was introduced. Thoracentesis was performed. The catheter was removed and a dressing applied. FINDINGS: A total of approximately 450 mL of dark red fluid was removed. IMPRESSION: Successful ultrasound guided left thoracentesis yielding 450 mL of pleural fluid. Procedure performed by Alwyn Ren NP Electronically Signed   By: Judie Petit.  Shick M.D.   On: 09/23/2023 11:02   DG Chest Port 1 View  Result Date: 09/23/2023 CLINICAL DATA:  Status post left thoracentesis. EXAM: PORTABLE CHEST 1 VIEW COMPARISON:  September 21, 2023. FINDINGS: No pneumothorax is noted status post left thoracentesis. Left pleural effusion is slightly decreased compared to prior exam. IMPRESSION: No pneumothorax status post left thoracentesis. Electronically Signed   By: Lupita Raider M.D.   On: 09/23/2023 10:54   DG Chest 2 View  Result Date: 09/21/2023 CLINICAL DATA:  Suspected sepsis. Lung and liver cancer per patient. Vomiting. EXAM: CHEST - 2 VIEW COMPARISON:  Chest  radiographs 09/09/2023 and 09/06/2023. Chest CT 07/16/2022. FINDINGS: Right IJ Port-A-Cath extends to the level of the superior cavoatrial junction, stable. The visualized heart size and mediastinal contours are stable. A large laterally loculated left pleural effusion appears slightly increased compared with the most recent prior study. Associated chronic compressive left lung atelectasis. No evidence of right-sided pleural effusion or pneumothorax. Mild right basilar atelectasis, unchanged. The bones appear unchanged. IMPRESSION: Slight increase in size of a large laterally loculated left pleural effusion with associated chronic compressive left lung atelectasis. No other significant changes. Electronically Signed  By: Carey Bullocks M.D.   On: 09/21/2023 14:19     Medications:    albumin human 12.5 g (09/23/23 0515)   potassium chloride 10 mEq (09/23/23 1212)    Chlorhexidine Gluconate Cloth  6 each Topical Daily   fluticasone furoate-vilanterol  1 puff Inhalation Daily   folic acid  1 mg Oral Daily   ipratropium-albuterol  3 mL Nebulization Q6H   methylPREDNISolone (SOLU-MEDROL) injection  40 mg Intravenous Q12H   pantoprazole (PROTONIX) IV  40 mg Intravenous Q12H   sertraline  50 mg Oral Daily   sodium chloride flush  3 mL Intravenous Q12H   sodium chloride  2 g Oral TID   acetaminophen **OR** acetaminophen, OLANZapine, ondansetron **OR** ondansetron (ZOFRAN) IV, polyethylene glycol  Assessment/ Plan:  [name] is a 42 y.o.  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 Hypervolemic.  -Sodium 121 today - Discussed Tolvaptan with family and they are agreeable.  - Will order Tolvatpan 15mg  once. - Continue  sodium levels     LOS: 1 [name] 12/9/20241:27 PM

## 2023-09-23 NOTE — Consult Note (Signed)
PHARMACY CONSULT NOTE - Tolvaptan  Pharmacy Consult for Tolvaptan Monitoring  Recent Labs: Potassium (mmol/L)  Date Value  09/23/2023 3.0 (L)   Magnesium (mg/dL)  Date Value  40/98/1191 1.5 (L)   Calcium (mg/dL)  Date Value  47/82/9562 7.8 (L)   Albumin (g/dL)  Date Value  13/05/6577 3.0 (L)   Phosphorus (mg/dL)  Date Value  46/96/2952 3.9   Sodium (mmol/L)  Date Value  09/23/2023 121 (L)    Assessment  [name] is a 42 y.o. male presenting with syncope. PMH significant for stage IV adenocarcinoma with brain, liver, bone metastasis, associated thrombocytopenia, Wolff-Parkinson-White syndrome.  Patient found to have serum Na of 121 on admission that worsened to 119. Pharmacy has been consulted to monitor tolvaptan as well as replace electrolytes  Pertinent medications: N/A Drug interactions: N/A  Goal of Therapy:  Na >130 Do not exceed increase in Na by 8 mEq/L per 8 hours or 12 mEq/L in 24 hours  Plan:  Give tolvaptan 15 mg x1 K 3.0: Kcl IV x 6 ordered by attending Check Na Q8H x2 then daily thereafter Continue to monitor for signs of clinical improvement and recommendations per nephrology Will order BMP, Mag and Phos with AM labs tomorrow  Bettey Costa, PharmD Clinical Pharmacist 09/23/2023 3:08 PM

## 2023-09-23 NOTE — Progress Notes (Signed)
[facility] Progress Note  Patient: [name]    MVH:[id number]  DOA: 09/21/2023     Date of Service: the patient was seen and examined on 09/23/2023  Chief Complaint  Patient presents with   Loss of Consciousness   Brief hospital course: [name] is a 42 y.o. male with medical history significant of stage IV lung adenocarcinoma with brain, liver, bone metastasis on Carboplatin-Alimta-Amivatimab, associated thrombocytopenia, Wolff-Parkinson-White syndrome on metoprolol, who presents to the ED due to syncope.  Patient is having nausea and vomiting for a few days, today patient was noted to have significant dyspnea on exertion when he went out for lunch.  Patient got home back and his legs gave out, suddenly LOC and patient passed out, caught by his father and wife, no trauma.  Patient regained consciousness quickly.  No seizure activity.  ED workup: VS HR 130 tachycardia, RR 22 tachypneic, BP 118/83, 99% on 2 L, afebrile BMP hyponatremia sodium 122, calcium 7.0 low could be due to low albumin 1.9 mildly elevated AST and ALT, bilirubin 2.1 elevated Lactic acidosis lactic acid 6.0 most likely due to intravascular volume depletion Chronic anemia hemoglobin 12.2, thrombocytopenia platelet 59 Blood culture sent CXR: Slight increase in size of a large laterally loculated left pleural effusion with associated chronic compressive left lung atelectasis. No other significant changes.   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 3 times daily Monitor BMP every 8 hourly Goal to raise sodium level 8 to 10 mEq in 24 hours Nephrology consulted, currently avoided 3% saline, we will continue to monitor.  # Recurrent malignant left pleural effusion 11/25 s/p thoracentesis 12/9 s/p left thora dark red fluid was  tapped, No pneumothorax status post left thoracentesis.  Continue IV Lasix and albumin 12.5 g every 6 hourly for 2 days till 12/10 IR consulted to repeat thoracentesis   # Anasarca due to liver cirrhosis and Ascites  12/3 S/p paracentesis Monitor for recurrence Started Lasix IV infusion Started albumin IV 12.5 g every 6 hourly for 2 days  # COPD his admission 12/8 patient was wheezing on my exam Started Solu-Medrol IV 40 mg Q12 hourly for 3 days PPI for GI prophylaxis DuoNeb every 6 hourly Breo Ellipta inhaler   # Syncope, likely due to orthostatic hypotension Patient has intravascular volume depletion due to low albumin S/p 2 L bolus given in the ED Due to liver cirrhosis and peripheral edema patient was given albumin 12.5 g every 6 hourly x 3 doses Continue fall precautions, monitor orthostatics Continue supportive care  # Lactic acidosis and metabolic acidosis most likely due to intractable vomiting and dehydration  # Intractable nausea and vomiting S/p vancomycin, cefepime and Flagyl 1 dose given in the ED, no more need of antibiotics, no signs of infection. S/p IV fluid given, lactic acidosis resolved Monitor bicarb level Continue symptomatic treatment for nausea and vomiting Started pantoprazole 40 mg IV twice daily for GI prophylaxis  # Hypokalemia, potassium repleted. # Hypomagnesemia, mag repleted. Monitor electrolytes and replete as needed.  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  # Transaminitis, decompensated liver cirrhosis Monitor LFTs and bilirubin  # WPW (  Wolff-Parkinson-White syndrome) Long-term history of Wolff-Parkinson-White syndrome on metoprolol.  Syncopal episode consistent with orthostasis, however will monitor on  telemetry overnight - Telemetry monitoring - Continue home metoprolol  # Depression, Home meds Zoloft and olanzapine d/c'd on 12/9 due to possible causing SIADH and hyponatremia.  Resume after sodium correction  # Adenocarcinoma of lung, stage 4, left History of stage IV adenocarcinoma of the left lung with metastasis to the pleural space, liver, brain, bone.  Currently undergoing chemotherapy and is hoping to get a second opinion at [facility] consulted  Overall long-term prognosis is poor.  Goals of care discussed patient's wife at bedside, who agreed with DNR/DNI and palliative care will be consulted.  We will continue medical management, if patient's condition does not improve or deteriorates then may transition to hospice care.  Body mass index is 27.26 kg/m.  Interventions:  Diet: Regular diet DVT Prophylaxis: SCD, pharmacological prophylaxis contraindicated due to thrombocytopenia, coagulopathy    Advance goals of care discussion: DNR/DNI-Limited  Family Communication: family was present at bedside, at the time of interview.  The pt provided permission to discuss medical plan with the family. Opportunity was given to ask question and all questions were answered satisfactorily.   Disposition:  Pt is from Home, admitted with Syncope, hyponatremia, stage IV metastatic cancer, still has low sodium, which precludes a safe discharge. Discharge to home, when stable.  Subjective: No significant events overnight, patient is lying comfortably, patient is more awake and alert today, denied any complaints, no difficulty breathing, no chest pain or palpitation.  Feeling a lot of improvement. Patient's family was at bedside, management plan discussed.   Physical Exam: General: NAD, lying comfortably Appear in no distress, affect appropriate Eyes: PERRLA ENT: Oral Mucosa Clear, moist  Neck: no JVD,  Cardiovascular: S1 and S2 Present, no Murmur,  Respiratory: Equal air entry  bilaterally, bilateral wheezing, bibasilar crackles Abdomen: Bowel Sound present, Soft but slightly distended due to ascites, no tenderness,  Skin: no rashes Extremities: 4+ pedal edema, no calf tenderness Neurologic: without any new focal findings Gait not checked due to patient safety concerns  Vitals:   09/23/23 0757 09/23/23 1013 09/23/23 1026 09/23/23 1141  BP: (!) 124/90 110/77 109/79 125/87  Pulse: (!) 120 (!) 124 (!) 127 (!) 114  Resp: 16   18  Temp: (!) 97.5 F (36.4 C)   97.8 F (36.6 C)  TempSrc: Oral   Oral  SpO2: 100% 100% 99% 98%  Weight:      Height:        Intake/Output Summary (Last 24 hours) at 09/23/2023 1421 Last data filed at 09/23/2023 1149 Gross per 24 hour  Intake 479.33 ml  Output --  Net 479.33 ml   Filed Weights   09/21/23 1340  Weight: 86.2 kg    Data Reviewed: I have personally reviewed and interpreted daily labs, tele strips, imagings as discussed above. I reviewed all nursing notes, pharmacy notes, vitals, pertinent old records I have discussed plan of care as described above with RN and patient/family.  CBC: Recent Labs  Lab 09/17/23 1036 09/19/23 0822 09/21/23 1407 09/22/23 0511 09/23/23 0500  WBC 2.7* 1.8* 7.4 7.7 7.7  NEUTROABS 1.5* 0.6* 4.7  --   --   HGB 10.4* 10.3* 12.2* 9.2* 8.9*  HCT 29.6* 29.3* 34.8* 25.8* 24.3*  MCV 91.1 90.2 92.8 89.9 90.0  PLT 23* 18* 59* 53* 61*   Basic Metabolic Panel: Recent Labs  Lab 09/21/23 1405 09/21/23 1407 09/21/23 2104 09/22/23  4098 09/22/23 1314 09/22/23 2150 09/23/23 0500  NA  --    < > 122* 119* 119* 119* 121*  K  --    < > 3.7 3.6 3.5 3.0* 2.9*  CL  --    < > 92* 90* 90* 88* 87*  CO2  --    < > 24 21* 20* 23 21*  GLUCOSE  --    < > 104* 89 103* 142* 123*  BUN  --    < > 11 10 10 12 12   CREATININE  --    < > 0.84 0.82 0.74 0.83 0.78  CALCIUM  --    < > 7.0* 7.4* 7.3* 7.2* 7.5*  MG 1.3*  --   --  1.3*  --   --  1.5*  PHOS  --   --   --  3.3  --   --  3.9   < > = values in  this interval not displayed.    Studies: US THORACENTESIS ASP PLEURAL SPACE W/IMG GUIDE  Result Date: 09/23/2023 INDICATION: Patient with a history of lung cancer with recurrent pleural effusions. Interventional radiology asked to perform a therapeutic thoracentesis. EXAM: ULTRASOUND GUIDED THORACENTESIS MEDICATIONS: 1% lidocaine 15 mL COMPLICATIONS: None immediate. PROCEDURE: An ultrasound guided thoracentesis was thoroughly discussed with the patient and questions answered. The benefits, risks, alternatives and complications were also discussed. The patient understands and wishes to proceed with the procedure. Written consent was obtained. Ultrasound was performed to localize and mark an adequate pocket of fluid in the left chest. The area was then prepped and draped in the normal sterile fashion. 1% Lidocaine was used for local anesthesia. Under ultrasound guidance a 6 Fr Safe-T-Centesis catheter was introduced. Thoracentesis was performed. The catheter was removed and a dressing applied. FINDINGS: A total of approximately 450 mL of dark red fluid was removed. IMPRESSION: Successful ultrasound guided left thoracentesis yielding 450 mL of pleural fluid. Procedure performed by Alwyn Ren NP Electronically Signed   By: Judie Petit.  Shick M.D.   On: 09/23/2023 11:02   DG Chest Port 1 View  Result Date: 09/23/2023 CLINICAL DATA:  Status post left thoracentesis. EXAM: PORTABLE CHEST 1 VIEW COMPARISON:  September 21, 2023. FINDINGS: No pneumothorax is noted status post left thoracentesis. Left pleural effusion is slightly decreased compared to prior exam. IMPRESSION: No pneumothorax status post left thoracentesis. Electronically Signed   By: Lupita Raider M.D.   On: 09/23/2023 10:54    Scheduled Meds:  Chlorhexidine Gluconate Cloth  6 each Topical Daily   fluticasone furoate-vilanterol  1 puff Inhalation Daily   folic acid  1 mg Oral Daily   ipratropium-albuterol  3 mL Nebulization Q6H   methylPREDNISolone  (SOLU-MEDROL) injection  40 mg Intravenous Q12H   pantoprazole (PROTONIX) IV  40 mg Intravenous Q12H   sertraline  50 mg Oral Daily   sodium chloride flush  3 mL Intravenous Q12H   sodium chloride  2 g Oral TID   Continuous Infusions:  albumin human 12.5 g (09/23/23 1338)   PRN Meds: acetaminophen **OR** acetaminophen, ondansetron **OR** ondansetron (ZOFRAN) IV, polyethylene glycol  Time spent: 55 minutes  Author: Gillis Santa. MD [facility] 09/23/2023 2:21 PM  To reach On-call, see care teams to locate the attending and reach out to them via [email or web address]. If 7PM-7AM, please contact night-coverage If you still have difficulty reaching the attending provider, please page the [facility] (Director on Call) for [facility] on amion for assistance.

## 2023-09-23 NOTE — Plan of Care (Incomplete)

## 2023-09-24 ENCOUNTER — Other Ambulatory Visit: Payer: Self-pay

## 2023-09-24 ENCOUNTER — Inpatient Hospital Stay: Payer: 59

## 2023-09-24 ENCOUNTER — Encounter: Payer: Self-pay | Admitting: *Deleted

## 2023-09-24 DIAGNOSIS — C3492 Malignant neoplasm of unspecified part of left bronchus or lung: Secondary | ICD-10-CM | POA: Diagnosis not present

## 2023-09-24 DIAGNOSIS — R55 Syncope and collapse: Principal | ICD-10-CM

## 2023-09-24 DIAGNOSIS — I471 Supraventricular tachycardia, unspecified: Secondary | ICD-10-CM | POA: Diagnosis not present

## 2023-09-24 DIAGNOSIS — R9431 Abnormal electrocardiogram [ECG] [EKG]: Secondary | ICD-10-CM | POA: Diagnosis not present

## 2023-09-24 DIAGNOSIS — Z515 Encounter for palliative care: Secondary | ICD-10-CM

## 2023-09-24 LAB — BASIC METABOLIC PANEL
Anion gap: 9 (ref 5–15)
Anion gap: 8 (ref 5–15)
Anion gap: 6 (ref 5–15)
BUN: 13 mg/dL (ref 6–20)
BUN: 14 mg/dL (ref 6–20)
BUN: 12 mg/dL (ref 6–20)
CO2: 25 mmol/L (ref 22–32)
CO2: 25 mmol/L (ref 22–32)
CO2: 24 mmol/L (ref 22–32)
Calcium: 7.9 mg/dL — ABNORMAL LOW (ref 8.9–10.3)
Calcium: 8.2 mg/dL — ABNORMAL LOW (ref 8.9–10.3)
Calcium: 7.7 mg/dL — ABNORMAL LOW (ref 8.9–10.3)
Chloride: 93 mmol/L — ABNORMAL LOW (ref 98–111)
Chloride: 100 mmol/L (ref 98–111)
Chloride: 104 mmol/L (ref 98–111)
Creatinine, Ser: 0.82 mg/dL (ref 0.61–1.24)
Creatinine, Ser: 0.74 mg/dL (ref 0.61–1.24)
Creatinine, Ser: 0.71 mg/dL (ref 0.61–1.24)
GFR, Estimated: >60 mL/min (ref >60)
GFR, Estimated: >60 mL/min (ref >60)
GFR, Estimated: >60 mL/min (ref >60)
Glucose, Bld: 137 mg/dL — ABNORMAL HIGH (ref 70–99)
Glucose, Bld: 147 mg/dL — ABNORMAL HIGH (ref 70–99)
Glucose, Bld: 170 mg/dL — ABNORMAL HIGH (ref 70–99)
Potassium: 3.8 mmol/L (ref 3.5–5.1)
Potassium: 3.5 mmol/L (ref 3.5–5.1)
Potassium: 3.1 mmol/L — ABNORMAL LOW (ref 3.5–5.1)
Sodium: 127 mmol/L — ABNORMAL LOW (ref 135–145)
Sodium: 133 mmol/L — ABNORMAL LOW (ref 135–145)
Sodium: 134 mmol/L — ABNORMAL LOW (ref 135–145)

## 2023-09-24 LAB — CBC
HCT: 23.7 % — ABNORMAL LOW (ref 39.0–52.0)
Hemoglobin: 8.4 g/dL — ABNORMAL LOW (ref 13.0–17.0)
MCH: 32.9 pg (ref 26.0–34.0)
MCHC: 35.4 g/dL (ref 30.0–36.0)
MCV: 92.9 fL (ref 80.0–100.0)
Platelets: 87 10*3/uL — ABNORMAL LOW (ref 150–400)
RBC: 2.55 MIL/uL — ABNORMAL LOW (ref 4.22–5.81)
RDW: 17.5 % — ABNORMAL HIGH (ref 11.5–15.5)
WBC: 15.1 10*3/uL — ABNORMAL HIGH (ref 4.0–10.5)
nRBC: 0.7 % — ABNORMAL HIGH (ref 0.0–0.2)

## 2023-09-24 LAB — HEPATIC FUNCTION PANEL
ALT: 56 U/L — ABNORMAL HIGH (ref 0–44)
AST: 52 U/L — ABNORMAL HIGH (ref 15–41)
Albumin: 3 g/dL — ABNORMAL LOW (ref 3.5–5.0)
Alkaline Phosphatase: 198 U/L — ABNORMAL HIGH (ref 38–126)
Bilirubin, Direct: 0.5 mg/dL — ABNORMAL HIGH (ref 0.0–0.2)
Indirect Bilirubin: 0.5 mg/dL (ref 0.3–0.9)
Total Bilirubin: 1 mg/dL (ref <1.2)
Total Protein: 4.9 g/dL — ABNORMAL LOW (ref 6.5–8.1)

## 2023-09-24 LAB — MAGNESIUM: Magnesium: 2 mg/dL (ref 1.7–2.4)

## 2023-09-24 LAB — PHOSPHORUS: Phosphorus: 2.5 mg/dL (ref 2.5–4.6)

## 2023-09-24 MED ORDER — DILTIAZEM HCL 25 MG/5ML IV SOLN
10.0000 mg | Freq: Once | INTRAVENOUS | Status: AC
  Administered 2023-09-24: 10 mg via INTRAVENOUS
  Filled 2023-09-24: qty 5

## 2023-09-24 MED ORDER — SODIUM CHLORIDE 0.9 % IV SOLN
500.0000 mg | INTRAVENOUS | Status: DC
  Administered 2023-09-24 – 2023-09-25 (×2): 500 mg via INTRAVENOUS
  Filled 2023-09-24 (×3): qty 5

## 2023-09-24 MED ORDER — POTASSIUM CHLORIDE CRYS ER 20 MEQ PO TBCR
40.0000 meq | EXTENDED_RELEASE_TABLET | Freq: Once | ORAL | Status: AC
  Administered 2023-09-24: 40 meq via ORAL
  Filled 2023-09-24: qty 2

## 2023-09-24 MED ORDER — ADENOSINE 6 MG/2ML IV SOLN: 6.0000 mg | Freq: Once | INTRAVENOUS | Status: DC

## 2023-09-24 MED ORDER — SODIUM CHLORIDE 0.9 % IV BOLUS
500.0000 mL | Freq: Once | INTRAVENOUS | Status: AC
  Administered 2023-09-24: 500 mL via INTRAVENOUS

## 2023-09-24 MED ORDER — TRAZODONE HCL 50 MG PO TABS
25.0000 mg | ORAL_TABLET | Freq: Every evening | ORAL | Status: DC | PRN
  Administered 2023-09-24 – 2023-09-25 (×2): 25 mg via ORAL
  Filled 2023-09-24 (×2): qty 1

## 2023-09-24 MED ORDER — LIDOCAINE HCL (PF) 1 % IJ SOLN
10.0000 mL | Freq: Once | INTRAMUSCULAR | Status: AC
  Administered 2023-09-24: 10 mL
  Filled 2023-09-24: qty 10

## 2023-09-24 MED ORDER — TOLVAPTAN 15 MG PO TABS
15.0000 mg | ORAL_TABLET | Freq: Once | ORAL | Status: AC
  Administered 2023-09-24: 15 mg via ORAL
  Filled 2023-09-24: qty 1

## 2023-09-24 MED ORDER — DEXTROSE 5 % IV BOLUS
500.0000 mL | Freq: Once | INTRAVENOUS | Status: AC
  Administered 2023-09-24: 500 mL via INTRAVENOUS

## 2023-09-24 MED ORDER — METOPROLOL TARTRATE 5 MG/5ML IV SOLN
5.0000 mg | Freq: Once | INTRAVENOUS | Status: AC
  Administered 2023-09-24: 5 mg via INTRAVENOUS
  Filled 2023-09-24: qty 5

## 2023-09-24 MED ORDER — IOHEXOL 300 MG/ML  SOLN
100.0000 mL | Freq: Once | INTRAMUSCULAR | Status: AC | PRN
  Administered 2023-09-24: 100 mL via INTRAVENOUS

## 2023-09-24 MED ORDER — ADENOSINE 6 MG/2ML IV SOLN
INTRAVENOUS | Status: AC
  Administered 2023-09-24: 6 mg
  Filled 2023-09-24: qty 2

## 2023-09-24 NOTE — Progress Notes (Signed)
       CROSS COVER NOTE  NAME: [name] MRN: [id number] DOB : 1981-09-30    Date of Service   09/24/2023   HPI/Events of Note   Nurse paged stating that patient stood up from bed to use the bathroom with assistance of family members.  Patient's heart rate on the cardiac monitor was noted to be about 175 bpm.  On assessment patient denied chest pain, dizziness or any other symptoms.  Patient's blood pressure was soft but systolics was greater than 90 mmHg.  Chart review by this NP shows that patient has a history of Wolf Parkinson's White syndrome and is currently taking 12.5 mg of metoprolol daily.  Patient has been getting his dose since admission.   Interventions   Plan: Patient given 5 mg of metoprolol without any effect.  Patient blood pressure was noted to be getting lower after the dose.  Systolics were in the upper 80s. Patient was given a 500 bolus of normal saline to augment blood pressure and given 10 mg of diltiazem IV.  Patient's heart rate was still sustained at about 170 bpm.  Cardiology was consulted at this time. Dr. Tenny Craw on-call cardiology physician recommended giving patient 6 mg of adenosine. Patient was given 6 mg of adenosine with this NP at bedside; patient tolerated well.  Repeat EKG shows rates in the 90s.  Patient resting in bed with family at bedside. Follow-up with cardiology in the a.m.     Chaselynn Kepple Lamin Geradine Girt, MSN, APRN, AGACNP-BC [facility] [place] Pager: [phone]. Check Amion for Availability

## 2023-09-24 NOTE — Consult Note (Addendum)
PHARMACY CONSULT NOTE - Tolvaptan  Pharmacy Consult for Tolvaptan Monitoring  Recent Labs: Potassium (mmol/L)  Date Value  09/24/2023 3.8   Magnesium (mg/dL)  Date Value  21/30/8657 1.5 (L)   Calcium (mg/dL)  Date Value  84/69/6295 7.9 (L)   Albumin (g/dL)  Date Value  28/41/3244 3.0 (L)   Phosphorus (mg/dL)  Date Value  10/17/7251 3.9   Sodium (mmol/L)  Date Value  09/24/2023 127 (L)    Assessment  [name] is a 42 y.o. male presenting with syncope. PMH significant for stage IV adenocarcinoma with brain, liver, bone metastasis, associated thrombocytopenia, Wolff-Parkinson-White syndrome.  Patient found to have serum Na of 121 on admission that worsened to 119. Pharmacy has been consulted to monitor tolvaptan as well as replace electrolytes.  Pertinent medications: N/A Drug interactions: N/A  Goal of Therapy:  Na >130,Do not exceed increase in Na by 8 mEq/L per 8 hours or 12 mEq/L in 24 hours All other electrolytes WNL  Plan:  Tolvaptan 15 mg x1 given 12/9@1143 , Na increased from 121>127 in 21 hrs (within appropriate rate of correction). 2nd tolvaptan dose ordered for 12/10 @1030  per nephrology. Target Na 132-133 per discussion with provider. K and Mg replaced yesterday 12/9. K = 3.8, no additional replacement needed at this timet. Mg and phos - wnl Will order BMP, Mag and Phos with AM labs tomorrow.  [name] PharmD, BCPS 09/24/2023 8:34 AM

## 2023-09-24 NOTE — Plan of Care (Signed)

## 2023-09-24 NOTE — Progress Notes (Signed)
[facility] Progress Note  Patient: [name]    GEX:[id number]  DOA: 09/21/2023     Date of Service: the patient was seen and examined on 09/24/2023  Chief Complaint  Patient presents with   Loss of Consciousness   Brief hospital course: [name] is a 42 y.o. male with medical history significant of stage IV lung adenocarcinoma with brain, liver, bone metastasis on Carboplatin-Alimta-Amivatimab, associated thrombocytopenia, Wolff-Parkinson-White syndrome on metoprolol, who presents to the ED due to syncope.  Patient is having nausea and vomiting for a few days, today patient was noted to have significant dyspnea on exertion when he went out for lunch.  Patient got home back and his legs gave out, suddenly LOC and patient passed out, caught by his father and wife, no trauma.  Patient regained consciousness quickly.  No seizure activity.  ED workup: VS HR 130 tachycardia, RR 22 tachypneic, BP 118/83, 99% on 2 L, afebrile BMP hyponatremia sodium 122, calcium 7.0 low could be due to low albumin 1.9 mildly elevated AST and ALT, bilirubin 2.1 elevated Lactic acidosis lactic acid 6.0 most likely due to intravascular volume depletion Chronic anemia hemoglobin 12.2, thrombocytopenia platelet 59 Blood culture sent CXR: Slight increase in size of a large laterally loculated left pleural effusion with associated chronic compressive left lung atelectasis. No other significant changes.   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 3 times daily, d/c'd on 12/10 Monitor BMP every 8 hourly Goal to raise sodium level 8 to 10 mEq in 24 hours Nephrology consulted, s/p tolvaptan 15 milligram daily x 2 doses    # Recurrent malignant left pleural effusion 11/25 s/p thoracentesis 12/9 s/p left thora dark red fluid was  tapped, No pneumothorax status post left thoracentesis.  S/p IV Lasix gtt, d/c'd by nephro on 12/9 S/p IV albumin 12.5 g every 6 hourly for 2 days till 12/10 IR consulted to repeat thoracentesis   # Anasarca due to liver cirrhosis and Ascites  12/3 S/p paracentesis 12/10 s/p paracentesis 2.7 L fluid was tapped Monitor for recurrence S/p IV Lasix gtt, d/c'd by nephro on 12/9 S/p IV albumin 12.5 g every 6 hourly for 2 days till 12/10   # COPD his admission 12/8 patient was wheezing on my exam Started Solu-Medrol IV 40 mg Q12 hourly for 3 days 12/10 started azithromycin 40 mg IV daily for 5 days PPI for GI prophylaxis DuoNeb every 6 hourly Breo Ellipta inhaler   # Syncope, likely due to orthostatic hypotension Patient has intravascular volume depletion due to low albumin S/p 2 L bolus given in the ED Due to liver cirrhosis and peripheral edema patient was given albumin 12.5 g every 6 hourly x 3 doses Continue fall precautions, monitor orthostatics Continue supportive care  # Lactic acidosis and metabolic acidosis most likely due to intractable vomiting and dehydration  # Intractable nausea and vomiting S/p vancomycin, cefepime and Flagyl 1 dose given in the ED, no more need of antibiotics, no signs of infection. S/p IV fluid given, lactic acidosis resolved Monitor bicarb level Continue symptomatic treatment for nausea and vomiting Started pantoprazole 40 mg IV twice daily for GI prophylaxis  # Hypokalemia, potassium repleted. # Hypomagnesemia, mag repleted. Monitor electrolytes and replete as needed.  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  # Transaminitis, decompensated liver cirrhosis Monitor LFTs and bilirubin  # WPW  (Wolff-Parkinson-White syndrome) Long-term history of Wolff-Parkinson-White syndrome on metoprolol.  Syncopal episode consistent with orthostasis, however will monitor on telemetry overnight - Telemetry monitoring - Continue home metoprolol 12.5 mg p.o. twice daily 12/10 early morning patient had an episode of tachycardia, received Lopressor 5 mg IV, Cardizem 10 mg IV We will consider cardiology consult if persistent tachycardia   # Depression, Home meds Zoloft and olanzapine d/c'd on 12/9 due to possible causing SIADH and hyponatremia.  Resume after sodium correction  # Adenocarcinoma of lung, stage 4, left History of stage IV adenocarcinoma of the left lung with metastasis to the pleural space, liver, brain, bone.  Currently undergoing chemotherapy and is hoping to get a second opinion at [facility] consulted 12/10 follow-up CT abdomen & pelvis  Overall long-term prognosis is poor.  Goals of care discussed patient's wife at bedside, who agreed with DNR/DNI and palliative care will be consulted.  We will continue medical management, if patient's condition does not improve or deteriorates then may transition to hospice care.  Body mass index is 27.26 kg/m.  Interventions:  Diet: Regular diet DVT Prophylaxis: SCD, pharmacological prophylaxis contraindicated due to thrombocytopenia, coagulopathy    Advance goals of care discussion: DNR/DNI-Limited  Family Communication: family was present at bedside, at the time of interview.  The pt provided permission to discuss medical plan with the family. Opportunity was given to ask question and all questions were answered satisfactorily.   Disposition:  Pt is from Home, admitted with Syncope, hyponatremia, stage IV metastatic cancer, still has low sodium, which precludes a safe discharge. Discharge to home, when stable.  Subjective: Overnight patient had an episode of tachycardia, received Lopressor and Cardizem IV push, in the morning  time patient was having shortness of breath and wheezing, no chest pain or palpitations. Management plan discussed with family at bedside.   Physical Exam: General: NAD, lying comfortably Appear in no distress, affect appropriate Eyes: PERRLA ENT: Oral Mucosa Clear, moist  Neck: no JVD,  Cardiovascular: S1 and S2 Present, no Murmur,  Respiratory: Equal air entry bilaterally, bilateral wheezing, bibasilar crackles Abdomen: Bowel Sound present, Soft but slightly distended due to ascites, no tenderness,  Skin: no rashes Extremities: 4+ pedal edema, no calf tenderness Neurologic: without any new focal findings Gait not checked due to patient safety concerns  Vitals:   09/24/23 0854 09/24/23 1300 09/24/23 1405 09/24/23 1456  BP:  116/76  120/79  Pulse: (!) 102 98  89  Resp: 20 (!) 21    Temp:  97.8 F (36.6 C)    TempSrc:  Oral    SpO2: 95% 100% 99% 98%  Weight:      Height:        Intake/Output Summary (Last 24 hours) at 09/24/2023 1616 Last data filed at 09/24/2023 1309 Gross per 24 hour  Intake 1051.24 ml  Output 1650 ml  Net -598.76 ml   Filed Weights   09/21/23 1340  Weight: 86.2 kg    Data Reviewed: I have personally reviewed and interpreted daily labs, tele strips, imagings as discussed above. I reviewed all nursing notes, pharmacy notes, vitals, pertinent old records I have discussed plan of care as described above with RN and patient/family.  CBC: Recent Labs  Lab 09/19/23 [id number] 09/21/23 1407 09/22/23 6213  09/23/23 0500 09/24/23 1103  WBC 1.8* 7.4 7.7 7.7 15.1*  NEUTROABS 0.6* 4.7  --   --   --   HGB 10.3* 12.2* 9.2* 8.9* 8.4*  HCT 29.3* 34.8* 25.8* 24.3* 23.7*  MCV 90.2 92.8 89.9 90.0 92.9  PLT 18* 59* 53* 61* 87*   Basic Metabolic Panel: Recent Labs  Lab 09/21/23 1405 09/21/23 1407 09/22/23 0511 09/22/23 1314 09/23/23 0500 09/23/23 1342 09/23/23 1356 09/23/23 1730 09/24/23 0250 09/24/23 1103  NA  --    < > 119*   < > 121* 121* 121* 120*  127* 133*  K  --    < > 3.6   < > 2.9* 3.1* 3.0*  --  3.8 3.5  CL  --    < > 90*   < > 87* 88* 89*  --  93* 100  CO2  --    < > 21*   < > 21* 23 21*  --  25 25  GLUCOSE  --    < > 89   < > 123* 147* 142*  --  137* 147*  BUN  --    < > 10   < > 12 13 12   --  13 14  CREATININE  --    < > 0.82   < > 0.78 0.94 0.78  --  0.82 0.74  CALCIUM  --    < > 7.4*   < > 7.5* 7.6* 7.8*  --  7.9* 8.2*  MG 1.3*  --  1.3*  --  1.5*  --   --   --   --  2.0  PHOS  --   --  3.3  --  3.9  --   --   --   --  2.5   < > = values in this interval not displayed.    Studies: US Paracentesis  Result Date: 09/24/2023 INDICATION: History of metastatic lung cancer with recurrent malignant ascites. Request is for therapeutic paracentesis EXAM: ULTRASOUND GUIDED THERAPEUTIC PARACENTESIS MEDICATIONS: Lidocaine 1% 10 mL COMPLICATIONS: None immediate. PROCEDURE: Informed written consent was obtained from the patient after a discussion of the risks, benefits and alternatives to treatment. A timeout was performed prior to the initiation of the procedure. Initial ultrasound scanning demonstrates a small amount of ascites within the right lower abdominal quadrant. The right lower abdomen was prepped and draped in the usual sterile fashion. 1% lidocaine was used for local anesthesia. Following this, a 19 gauge, 7-cm, Yueh catheter was introduced. An ultrasound image was saved for documentation purposes. The paracentesis was performed. The catheter was removed and a dressing was applied. The patient tolerated the procedure well without immediate post procedural complication. Patient received post-procedure intravenous albumin; see nursing notes for details. FINDINGS: A total of approximately 2.7 L of straw-colored fluid was removed. Samples were sent to the laboratory as requested by the clinical team. IMPRESSION: Successful ultrasound-guided therapeutic paracentesis yielding 2.7 liters of peritoneal fluid. Performed by Anders Grant NP  Electronically Signed   By: Judie Petit.  Shick M.D.   On: 09/24/2023 16:04    Scheduled Meds:  adenosine (ADENOCARD) IV  6 mg Intravenous Once   Chlorhexidine Gluconate Cloth  6 each Topical Daily   fluticasone furoate-vilanterol  1 puff Inhalation Daily   folic acid  1 mg Oral Daily   ipratropium-albuterol  3 mL Nebulization Q6H   methylPREDNISolone (SOLU-MEDROL) injection  40 mg Intravenous Q12H   metoprolol tartrate  12.5 mg Oral BID   pantoprazole (  PROTONIX) IV  40 mg Intravenous Q12H   sodium chloride flush  3 mL Intravenous Q12H   sodium chloride  2 g Oral TID   Continuous Infusions:  azithromycin     PRN Meds: acetaminophen **OR** acetaminophen, ondansetron **OR** ondansetron (ZOFRAN) IV, polyethylene glycol, traZODone  Time spent: 55 minutes  Author: Gillis Santa. MD [facility] 09/24/2023 4:16 PM  To reach On-call, see care teams to locate the attending and reach out to them via [email or web address]. If 7PM-7AM, please contact night-coverage If you still have difficulty reaching the attending provider, please page the [facility] (Director on Call) for [facility] on amion for assistance.

## 2023-09-24 NOTE — Consult Note (Signed)
Palliative Medicine [facility] [phone] [phone]) [phone]   Name: [name] Date: 09/24/2023 MRN: [id number]  DOB: August 24, 1981  Patient Care Team: Pcp, No as PCP - General [name], RN as Oncology Nurse Navigator [name], MD as Consulting Physician (Internal Medicine)    REASON FOR CONSULTATION: [name] is a 42 y.o. male with multiple medical problems including stage IV adenocarcinoma of the lung status post multiple previous lines of systemic treatment, who was admitted to hospital on 09/21/2023 with syncope.  Found to have hyponatremia, anasarca.  Palliative care was consulted to address goals.  SOCIAL HISTORY:     reports that he has never smoked. He has quit using smokeless tobacco.  His smokeless tobacco use included snuff and chew. He reports that he does not drink alcohol and does not use drugs.  Patient is married lives at home with his wife and daughter.  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   PORTA CATH INSERTION N/A 03/28/2021   Procedure: PORTA CATH INSERTION;  Surgeon: Renford Dills, MD;  Location: [facility] INVASIVE CV LAB;  Service: Cardiovascular;  Laterality: N/A;   VIDEO BRONCHOSCOPY WITH ENDOBRONCHIAL NAVIGATION N/A 03/15/2021   Procedure: VIDEO BRONCHOSCOPY WITH ENDOBRONCHIAL NAVIGATION;  Surgeon: Vida Rigger, MD;  Location: [facility] ORS;  Service: Thoracic;  Laterality: N/A;   VIDEO BRONCHOSCOPY WITH ENDOBRONCHIAL ULTRASOUND N/A 03/15/2021   Procedure: VIDEO BRONCHOSCOPY WITH ENDOBRONCHIAL ULTRASOUND;  Surgeon: Vida Rigger, MD;  Location: [facility] ORS;   Service: Thoracic;  Laterality: N/A;    HEMATOLOGY/ONCOLOGY HISTORY:  Oncology History Overview Note  IMPRESSION: 1. Patchy nodular fat stranding throughout the anterior left upper quadrant peritoneal fat, nonspecific, cannot exclude peritoneal carcinomatosis. Dedicated CT abdomen/pelvis with oral and IV contrast recommended for further evaluation. 2. Dense patchy consolidation replacing much of the left lower lung lobe, appearing masslike in the superior segment left lower lobe, with associated bulging of the left major fissure and associated left lower lobe volume loss. Fine nodularity throughout both lungs with an upper lobe predominance. Asymmetric left upper lobe interlobular septal thickening. These findings are indeterminate, with differential including multilobar pneumonia, sarcoidosis or a neoplastic process. The persistence on radiographs back to 01/20/2021 despite antibiotic therapy make sarcoidosis or a neoplastic process more likely. Pulmonology consultation suggested for consideration of bronchoscopic evaluation. 3. Small dependent left pleural effusion. 4. Mild mediastinal lymphadenopathy, nonspecific. 5. Subacute healing lateral right sixth rib fracture.   DIAGNOSIS:  A. LUNG, LEFT LOWER LOBE; ENB-ASSISTED BIOPSY:  - NON-SMALL CELL CARCINOMA, FAVOR ADENOCARCINOMA.  - FOREIGN MATERIAL SUGGESTIVE OF ASPIRATION.   # EGFR MUTATED: 13 del- June 28th, 2022- [name]. [limited]  # Asymptomatic multiple brain mets subcentimeter asymptomatic -JUNE 29th, 2023- Numerous metastatic lesions in the supratentorial brain-increase in number also conspicuity compared to January 2023.  Pre-SBRT MRI brain AUG 18th, 2023-  Eighteen small enhancing brain metastases (punctate to 11 mm); were all present on 03/28/2021-s/p SBRT SEP 2023- [[place]]; [facility]/ radiation treatment for brain metastasis He finished treatment on 11-26-22.   # SEP-OCT-worsening left-sided pleural effusion-status post  paracentesis; exudative; Cytology negative- ?  Progressive disease.  # NOV 2023- JAN 2024 CARBO-TAXOL-BEV+ATEZO x4 cyles- BEV+ATEZO- until APRIL 2024  # STAGE IV- [JUNE 2022] Left lower lobe lung cancer-adenocarcinoma the lung;  EGFR- 19 del POSITIVE.  While on osimertinib- NOV 1st, 2023- PET scan- shows progressive disease in liver. Liquid Biopsy/Guardant testing-positive for EGFR mutation; otherwise Negative for any novel mutations.  Repeat liver Biopsy positive for adenocarcinoma. NEGATIVE for MET amplification.  S/p second opinion at [facility]- Dr.Stinchcomb. status post 4 cycles of carbo Taxol-Atezo+Bev- JAN 25th, 2024-PET- Interval response to therapy with resolution of previously demonstrated extensive hypermetabolic activity throughout the liver. PARIL 2024- ? Partial response, but clinically worse/rising LFTs  # MAY 6th, Brain MRI-progressive multiple brain metastases  # MAY 16th, 2024- Alimta+Avastin #1    Cancer of lower lobe of left lung (HCC)  03/23/2021 Initial Diagnosis   Cancer of lower lobe of left lung (HCC)   03/29/2021 Cancer Staging   Staging form: Lung, AJCC 8th Edition - Clinical: Stage IVB (cT3, cN3, pM1c) - Signed by [name], MD on 03/29/2021   03/30/2021 - 03/30/2021 Chemotherapy   Patient is on Treatment Plan : LUNG NSCLC Pemetrexed (Alimta) / Carboplatin q21d x 1 cycles     08/17/2022 - 01/15/2023 Chemotherapy   Patient is on Treatment Plan : LUNG Atezolizumab + Bevacizumab + Carboplatin + Paclitaxel q21d Induction x 4 cycles / Atezolizumab + Bevacizumab q21d Maintenance     02/28/2023 - 07/02/2023 Chemotherapy   Patient is on Treatment Plan : LUNG NON-SMALL CELL Bevacizumab + Pemetrexed q21d     08/07/2023 -  Chemotherapy   Patient is on Treatment Plan : LUNG NSCLC Pemetrexed + Carboplatin + Amivantamab q21d x 4 Cycles / Pemetrexed + Amivantamab q21d       ALLERGIES:  is allergic to codeine.  MEDICATIONS:  Current Facility-Administered Medications   Medication Dose Route Frequency Provider Last Rate Last Admin   acetaminophen (TYLENOL) tablet 650 mg  650 mg Oral Q6H PRN Verdene Lennert, MD       Or   acetaminophen (TYLENOL) suppository 650 mg  650 mg Rectal Q6H PRN Verdene Lennert, MD       adenosine (ADENOCARD) 6 MG/2ML injection 6 mg  6 mg Intravenous Once Jawo, Modou L, NP       Chlorhexidine Gluconate Cloth 2 % PADS 6 each  6 each Topical Daily Jawo, Modou L, NP   6 each at 09/24/23 1056   fluticasone furoate-vilanterol (BREO ELLIPTA) 200-25 MCG/ACT 1 puff  1 puff Inhalation Daily Gillis Santa, MD   1 puff at 09/24/23 1042   folic acid (FOLVITE) tablet 1 mg  1 mg Oral Daily Verdene Lennert, MD   1 mg at 09/23/23 2203   ipratropium-albuterol (DUONEB) 0.5-2.5 (3) MG/3ML nebulizer solution 3 mL  3 mL Nebulization Q6H Gillis Santa, MD   3 mL at 09/24/23 1403   methylPREDNISolone sodium succinate (SOLU-MEDROL) 40 mg/mL injection 40 mg  40 mg Intravenous Q12H Gillis Santa, MD   40 mg at 09/24/23 1043   metoprolol tartrate (LOPRESSOR) tablet 12.5 mg  12.5 mg Oral BID Gillis Santa, MD   12.5 mg at 09/24/23 1041   ondansetron (ZOFRAN) tablet 4 mg  4 mg Oral Q6H PRN Verdene Lennert, MD       Or   ondansetron (ZOFRAN) injection 4 mg  4 mg Intravenous Q6H PRN Verdene Lennert, MD   4 mg at 09/23/23 2205   pantoprazole (PROTONIX) injection 40 mg  40 mg  Intravenous Q12H Gillis Santa, MD   40 mg at 09/24/23 1042   polyethylene glycol (MIRALAX / GLYCOLAX) packet 17 g  17 g Oral Daily PRN Verdene Lennert, MD       sodium chloride flush (NS) 0.9 % injection 3 mL  3 mL Intravenous Q12H Verdene Lennert, MD   3 mL at 09/24/23 1044   sodium chloride tablet 2 g  2 g Oral TID Gillis Santa, MD   2 g at 09/24/23 1044   Facility-Administered Medications Ordered in Other Encounters  Medication Dose Route Frequency Provider Last Rate Last Admin   heparin lock flush 100 UNIT/ML injection            heparin lock flush 100 UNIT/ML injection            heparin  lock flush 100 unit/mL  500 Units Intravenous Once Shalece Staffa, Daryl Eastern, NP       sodium chloride flush (NS) 0.9 % injection 10 mL  10 mL Intravenous PRN [name], MD   10 mL at 07/28/21 0852   sodium chloride flush (NS) 0.9 % injection 10 mL  10 mL Intravenous Once Syed Zukas, Daryl Eastern, NP        VITAL SIGNS: BP 116/76 (BP Location: Left Arm)   Pulse 98   Temp 97.8 F (36.6 C) (Oral)   Resp (!) 21   Ht 5\' 10"  (1.778 m)   Wt 190 lb (86.2 kg)   SpO2 99%   BMI 27.26 kg/m  Filed Weights   09/21/23 1340  Weight: 190 lb (86.2 kg)    Estimated body mass index is 27.26 kg/m as calculated from the following:   Height as of this encounter: 5\' 10"  (1.778 m).   Weight as of this encounter: 190 lb (86.2 kg).  LABS: CBC:    Component Value Date/Time   WBC 15.1 (H) 09/24/2023 1103   HGB 8.4 (L) 09/24/2023 1103   HGB 10.3 (L) 09/19/2023 0822   HCT 23.7 (L) 09/24/2023 1103   PLT 87 (L) 09/24/2023 1103   PLT 18 (L) 09/19/2023 0822   MCV 92.9 09/24/2023 1103   NEUTROABS 4.7 09/21/2023 1407   LYMPHSABS 1.0 09/21/2023 1407   MONOABS 1.6 (H) 09/21/2023 1407   EOSABS 0.0 09/21/2023 1407   BASOSABS 0.0 09/21/2023 1407   Comprehensive Metabolic Panel:    Component Value Date/Time   NA 133 (L) 09/24/2023 1103   K 3.5 09/24/2023 1103   CL 100 09/24/2023 1103   CO2 25 09/24/2023 1103   BUN 14 09/24/2023 1103   CREATININE 0.74 09/24/2023 1103   CREATININE 0.58 (L) 09/19/2023 0822   GLUCOSE 147 (H) 09/24/2023 1103   CALCIUM 8.2 (L) 09/24/2023 1103   AST 52 (H) 09/24/2023 1103   AST 75 (H) 09/19/2023 0822   ALT 56 (H) 09/24/2023 1103   ALT 87 (H) 09/19/2023 0822   ALKPHOS 198 (H) 09/24/2023 1103   BILITOT 1.0 09/24/2023 1103   BILITOT 1.2 (H) 09/19/2023 0822   PROT 4.9 (L) 09/24/2023 1103   ALBUMIN 3.0 (L) 09/24/2023 1103    RADIOGRAPHIC STUDIES: US THORACENTESIS ASP PLEURAL SPACE W/IMG GUIDE  Result Date: 09/23/2023 INDICATION: Patient with a history of lung cancer with  recurrent pleural effusions. Interventional radiology asked to perform a therapeutic thoracentesis. EXAM: ULTRASOUND GUIDED THORACENTESIS MEDICATIONS: 1% lidocaine 15 mL COMPLICATIONS: None immediate. PROCEDURE: An ultrasound guided thoracentesis was thoroughly discussed with the patient and questions answered. The benefits, risks, alternatives and complications were also discussed. The  patient understands and wishes to proceed with the procedure. Written consent was obtained. Ultrasound was performed to localize and mark an adequate pocket of fluid in the left chest. The area was then prepped and draped in the normal sterile fashion. 1% Lidocaine was used for local anesthesia. Under ultrasound guidance a 6 Fr Safe-T-Centesis catheter was introduced. Thoracentesis was performed. The catheter was removed and a dressing applied. FINDINGS: A total of approximately 450 mL of dark red fluid was removed. IMPRESSION: Successful ultrasound guided left thoracentesis yielding 450 mL of pleural fluid. Procedure performed by Alwyn Ren NP Electronically Signed   By: Judie Petit.  Shick M.D.   On: 09/23/2023 11:02   DG Chest Port 1 View  Result Date: 09/23/2023 CLINICAL DATA:  Status post left thoracentesis. EXAM: PORTABLE CHEST 1 VIEW COMPARISON:  September 21, 2023. FINDINGS: No pneumothorax is noted status post left thoracentesis. Left pleural effusion is slightly decreased compared to prior exam. IMPRESSION: No pneumothorax status post left thoracentesis. Electronically Signed   By: Lupita Raider M.D.   On: 09/23/2023 10:54   DG Chest 2 View  Result Date: 09/21/2023 CLINICAL DATA:  Suspected sepsis. Lung and liver cancer per patient. Vomiting. EXAM: CHEST - 2 VIEW COMPARISON:  Chest radiographs 09/09/2023 and 09/06/2023. Chest CT 07/16/2022. FINDINGS: Right IJ Port-A-Cath extends to the level of the superior cavoatrial junction, stable. The visualized heart size and mediastinal contours are stable. A large laterally  loculated left pleural effusion appears slightly increased compared with the most recent prior study. Associated chronic compressive left lung atelectasis. No evidence of right-sided pleural effusion or pneumothorax. Mild right basilar atelectasis, unchanged. The bones appear unchanged. IMPRESSION: Slight increase in size of a large laterally loculated left pleural effusion with associated chronic compressive left lung atelectasis. No other significant changes. Electronically Signed   By: Carey Bullocks M.D.   On: 09/21/2023 14:19   US Paracentesis  Result Date: 09/17/2023 INDICATION: Metastatic lung cancer and malignant ascites. Request for therapeutic paracentesis. EXAM: ULTRASOUND GUIDED PARACENTESIS MEDICATIONS: 1% lidocaine 10 mL COMPLICATIONS: None immediate. PROCEDURE: Informed written consent was obtained from the patient after a discussion of the risks, benefits and alternatives to treatment. A timeout was performed prior to the initiation of the procedure. Initial ultrasound scanning demonstrates a large amount of ascites within the right lateral abdomen. The right lateral abdomen was prepped and draped in the usual sterile fashion. 1% lidocaine was used for local anesthesia. Following this, a 19 gauge, 7-cm, Yueh catheter was introduced. An ultrasound image was saved for documentation purposes. The paracentesis was performed. The catheter was removed and a dressing was applied. The patient tolerated the procedure well without immediate post procedural complication. FINDINGS: A total of approximately 5.6 L of clear yellow fluid was removed. IMPRESSION: Successful ultrasound-guided paracentesis yielding 5.6 liters of peritoneal fluid. Electronically Signed   By: Gilmer Mor D.O.   On: 09/17/2023 12:03   DG Chest Port 1 View  Result Date: 09/09/2023 CLINICAL DATA:  Left lower lobe lung cancer. Status post left thoracentesis. EXAM: PORTABLE CHEST 1 VIEW COMPARISON:  09/06/2023 FINDINGS: Power  injectable Port-A-Cath tip: Cavoatrial junction. Minimal increase in aeration in the left lung, large left pleural effusion observed. No pneumothorax or radiographic evidence of procedure complication. The right lung remains clear. Heart size within normal limits although the left apical border remains obscured. IMPRESSION: 1. Minimal increase in aeration in the left lung, large left pleural effusion observed. No pneumothorax or radiographic evidence of procedure complication. Electronically Signed  By: Gaylyn Rong M.D.   On: 09/09/2023 14:38   US THORACENTESIS ASP PLEURAL SPACE W/IMG GUIDE  Result Date: 09/09/2023 INDICATION: Patient with history of left lung cancer with recurrent left-sided pleural effusion. Request is for therapeutic thoracentesis EXAM: ULTRASOUND GUIDED THERAPEUTIC LEFT-SIDED THORACENTESIS MEDICATIONS: Lidocaine 1% 10 mL COMPLICATIONS: None immediate. PROCEDURE: An ultrasound guided thoracentesis was thoroughly discussed with the patient and questions answered. The benefits, risks, alternatives and complications were also discussed. The patient understands and wishes to proceed with the procedure. Written consent was obtained. Ultrasound was performed to localize and mark an adequate pocket of fluid in the left chest. The area was then prepped and draped in the normal sterile fashion. 1% Lidocaine was used for local anesthesia. Under ultrasound guidance a 6 Fr Safe-T-Centesis catheter was introduced. Thoracentesis was performed. The catheter was removed and a dressing applied. FINDINGS: A total of approximately 450 mL of reddish brown fluid was removed. Samples were sent to the laboratory as requested by the clinical team. IMPRESSION: Successful ultrasound guided therapeutic left-sided thoracentesis yielding 450 mL of pleural fluid. Performed by Anders Grant NP Electronically Signed   By: Judie Petit.  Shick M.D.   On: 09/09/2023 13:58   DG Chest 2 View  Result Date:  09/06/2023 CLINICAL DATA:  Cough and shortness of breath. History of lung cancer. EXAM: CHEST - 2 VIEW COMPARISON:  Chest x-ray dated August 13, 2023. FINDINGS: Unchanged right chest wall port catheter. Stable cardiomediastinal silhouette with normal heart size. Unchanged chronic loculated large left pleural effusion with atelectasis in the lingula and left lower lobe. Slightly increased interstitial thickening in the right lung. No pneumothorax. No acute osseous abnormality. IMPRESSION: 1. Slightly increased interstitial thickening in the right lung, which may reflect atypical infection or pulmonary edema. 2. Unchanged chronic loculated large left pleural effusion. Electronically Signed   By: Obie Dredge M.D.   On: 09/06/2023 17:11   US Paracentesis  Result Date: 09/06/2023 INDICATION: malignant ascites EXAM: ULTRASOUND GUIDED  PARACENTESIS MEDICATIONS: None. COMPLICATIONS: None immediate. PROCEDURE: Informed written consent was obtained from the patient after a discussion of the risks, benefits and alternatives to treatment. A timeout was performed prior to the initiation of the procedure. Initial ultrasound scanning demonstrates a large amount of ascites within the right lower abdominal quadrant. The right lower abdomen was prepped and draped in the usual sterile fashion. 1% lidocaine was used for local anesthesia. Following this, a 19 gauge, 7-cm, Yueh catheter was introduced. An ultrasound image was saved for documentation purposes. The paracentesis was performed. The catheter was removed and a dressing was applied. The patient tolerated the procedure well without immediate post procedural complication. FINDINGS: A total of approximately 4.8 L of clear, straw-colored fluid was removed. IMPRESSION: Successful ultrasound-guided paracentesis yielding 4.8 liters of peritoneal fluid. Electronically Signed   By: Olive Bass M.D.   On: 09/06/2023 16:39    PERFORMANCE STATUS (ECOG) : 2 - Symptomatic,  <50% confined to bed  Review of Systems Unless otherwise noted, a complete review of systems is negative.  Physical Exam General: Frail appearing Pulmonary: Wheezing Extremities: no edema, no joint deformities Skin: no rashes Neurological: Weakness but otherwise nonfocal  IMPRESSION: Patient admitted with syncope and found to have hyponatremia, anasarca, recurrent malignant left pleural effusion.  Patient had episode of tachyarrhythmia overnight requiring adenosine.  Currently, patient is talkative but having visual hallucinations.  Patient has previously voiced a strong desire to continue treatment.  He had plan to travel to [facility] for third opinion.  However, in light of his worsening performance status and overall medical condition, it seems unlikely that patient will be in a state where he can travel.  Patient does continue to desire exploration of options at [facility].  Dr. Donneta Romberg has been in communication with their team.  I met privately with patient's wife.  She recognizes that patient is likely nearing end-of-life.  We discussed the option of hospice involvement either at home or a hospice facility.  She voiced some concerns about their 39 year old daughter, Julious Oka, and patient potentially dying at home.  I strongly recommended involvement of [facility].  Symptomatically, wife says the patient had significant difficulty sleeping last night.  He has been energized and talkative today.  Question if this is results of steroids.  Will start patient on low-dose trazodone at bedtime as needed for sleep.  PLAN: -Continue current scope of treatment -Patient will benefit from further conversations regarding goals when options are better clarified -Trazodone 25 mg nightly as needed for sleep -Will follow  Case and plan discussed with Dr. Donneta Romberg   Time Total: 45 minutes  Visit consisted of counseling and education dealing with the complex and emotionally intense  issues of symptom management and palliative care in the setting of serious and potentially life-threatening illness.Greater than 50%  of this time was spent counseling and coordinating care related to the above assessment and plan.  Signed by: Laurette Schimke, PhD, NP-C

## 2023-09-24 NOTE — Progress Notes (Signed)
Per Dr. Leonard Schwartz, need to order add on pathology testing for HER2 IHC to lung tissue [phone]) and liver tissue [phone]). Order faxed to [facility] histology attn: Angie at [phone].

## 2023-09-24 NOTE — Progress Notes (Signed)
[name]   DOB:June 13, 1981   ZO#:[id number]    Subjective: Overnight patient had episode of tachyarrhythmia.  Patient's status post adenosine.  Otherwise at this time patient resting in the bed comfortably.  Is currently on 4 L of oxygen.  Accompanied by his father and wife.  Objective:  Vitals:   09/24/23 0432 09/24/23 0854  BP: 112/71   Pulse: (!) 110 (!) 102  Resp:  20  Temp:    SpO2:  95%     Intake/Output Summary (Last 24 hours) at 09/24/2023 1302 Last data filed at 09/24/2023 1143 Gross per 24 hour  Intake 1051.24 ml  Output 800 ml  Net 251.24 ml   Bilateral wheezing noted.   Bilateral leg swelling.  Physical Exam Vitals and nursing note reviewed.  HENT:     Head: Normocephalic and atraumatic.     Mouth/Throat:     Pharynx: Oropharynx is clear.  Eyes:     Extraocular Movements: Extraocular movements intact.     Pupils: Pupils are equal, round, and reactive to light.  Cardiovascular:     Rate and Rhythm: Normal rate and regular rhythm.  Pulmonary:     Comments: Decreased breath sounds bilaterally.  Abdominal:     Palpations: Abdomen is soft.  Musculoskeletal:        General: Normal range of motion.     Cervical back: Normal range of motion.  Skin:    General: Skin is warm.  Neurological:     General: No focal deficit present.     Mental Status: He is alert and oriented to person, place, and time.  Psychiatric:        Behavior: Behavior normal.        Judgment: Judgment normal.      Labs:  Lab Results  Component Value Date   WBC 15.1 (H) 09/24/2023   HGB 8.4 (L) 09/24/2023   HCT 23.7 (L) 09/24/2023   MCV 92.9 09/24/2023   PLT 87 (L) 09/24/2023   NEUTROABS 4.7 09/21/2023    Lab Results  Component Value Date   NA 133 (L) 09/24/2023   K 3.5 09/24/2023   CL 100 09/24/2023   CO2 25 09/24/2023    Studies:  US THORACENTESIS ASP PLEURAL SPACE W/IMG GUIDE  Result Date: 09/23/2023 INDICATION: Patient with a history of lung cancer with  recurrent pleural effusions. Interventional radiology asked to perform a therapeutic thoracentesis. EXAM: ULTRASOUND GUIDED THORACENTESIS MEDICATIONS: 1% lidocaine 15 mL COMPLICATIONS: None immediate. PROCEDURE: An ultrasound guided thoracentesis was thoroughly discussed with the patient and questions answered. The benefits, risks, alternatives and complications were also discussed. The patient understands and wishes to proceed with the procedure. Written consent was obtained. Ultrasound was performed to localize and mark an adequate pocket of fluid in the left chest. The area was then prepped and draped in the normal sterile fashion. 1% Lidocaine was used for local anesthesia. Under ultrasound guidance a 6 Fr Safe-T-Centesis catheter was introduced. Thoracentesis was performed. The catheter was removed and a dressing applied. FINDINGS: A total of approximately 450 mL of dark red fluid was removed. IMPRESSION: Successful ultrasound guided left thoracentesis yielding 450 mL of pleural fluid. Procedure performed by Alwyn Ren NP Electronically Signed   By: Judie Petit.  Shick M.D.   On: 09/23/2023 11:02   DG Chest Port 1 View  Result Date: 09/23/2023 CLINICAL DATA:  Status post left thoracentesis. EXAM: PORTABLE CHEST 1 VIEW COMPARISON:  September 21, 2023. FINDINGS: No pneumothorax is noted status post left thoracentesis. Left  pleural effusion is slightly decreased compared to prior exam. IMPRESSION: No pneumothorax status post left thoracentesis. Electronically Signed   By: Lupita Raider M.D.   On: 09/23/2023 73:53    # 42 year old Caucasian male patient with history of metastatic adenocarcinoma of the lung-is currently admitted to hospital for syncopal episode; noted to have severe hyponatremia hypokalemia.   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 Malignant ascites/malignant pleural  effusion status post-paracentesis/ Thoracentesis.recommend CT scan chest and pelvis to reevaluate the status of the disease/response to therapy.   Will also check with pathology regarding HER2 testing by IHC/or NGS testing on current available tissue.  I again discussed the possible need for biopsy-for NGS testing if current issue is inadequate. Patient is not too keen-which is quite understandable.  Also discussed with the doctor at [facility]-currently patient poor candidate for any clinical trial/given his decompensation.  # Acute respiratory failure-underlying malignancy/wheezing-agree with steroids and nebulization.  Await CT scan for further evaluation.  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   # Electrolyte abnormalities: acute on chronic hyponatremia-dehydration/[third spacing-secondary to hypoalbuminemia/cirrhosis] status post evaluation with nephrology on ADH Antagonist-slowly improving.  Severe hypokalemia improved.  # Anemia thrombocytopenia likely secondary to chemotherapy slowly improving.      # DNR/DNI  Nathan Coder, MD 09/24/2023  1:02 PM

## 2023-09-24 NOTE — Progress Notes (Addendum)
[facility]  ROUNDING NOTE   Subjective:   Patient seen sitting up in bed Wife and other family at bedside Wife states patient had large amount of urine output overnight  Sodium 127 Urine appears clear  Objective:  Vital signs in last 24 hours:  Temp:  [97.2 F (36.2 C)-97.8 F (36.6 C)] 97.8 F (36.6 C) (12/10 0000) Pulse Rate:  [102-176] 102 (12/10 0854) Resp:  [15-20] 20 (12/10 0854) BP: (86-129)/(70-85) 112/71 (12/10 0432) SpO2:  [95 %-100 %] 95 % (12/10 0854)  Weight change:  Filed Weights   09/21/23 1340  Weight: 86.2 kg    Intake/Output: I/O last 3 completed shifts: In: 1480.6 [P.O.:660; I.V.:38.6; IV Piggyback:782] Out: 300 [Urine:300]   Intake/Output this shift:  Total I/O In: -  Out: 1350 [Urine:1350]  Physical Exam: General: NAD  Head: Normocephalic, atraumatic. Moist oral mucosal membranes  Eyes: Anicteric  Lungs:  Coarse,  room air  Heart: Regular rate and rhythm  Abdomen:  Soft, nontender  Extremities:  1+  peripheral edema.  Neurologic: Alert, moving all four extremities  Skin: No lesions       Basic Metabolic Panel: Recent Labs  Lab 09/21/23 1405 09/21/23 1407 09/22/23 0511 09/22/23 1314 09/23/23 0500 09/23/23 1342 09/23/23 1356 09/23/23 1730 09/24/23 0250 09/24/23 1103  NA  --    < > 119*   < > 121* 121* 121* 120* 127* 133*  K  --    < > 3.6   < > 2.9* 3.1* 3.0*  --  3.8 3.5  CL  --    < > 90*   < > 87* 88* 89*  --  93* 100  CO2  --    < > 21*   < > 21* 23 21*  --  25 25  GLUCOSE  --    < > 89   < > 123* 147* 142*  --  137* 147*  BUN  --    < > 10   < > 12 13 12   --  13 14  CREATININE  --    < > 0.82   < > 0.78 0.94 0.78  --  0.82 0.74  CALCIUM  --    < > 7.4*   < > 7.5* 7.6* 7.8*  --  7.9* 8.2*  MG 1.3*  --  1.3*  --  1.5*  --   --   --   --  2.0  PHOS  --   --  3.3  --  3.9  --   --   --   --  2.5   < > = values in this interval not displayed.    Liver Function Tests: Recent Labs  Lab 09/19/23 0822  09/21/23 1407 09/23/23 0500 09/23/23 1356 09/24/23 1103  AST 75* 75* 48* 51* 52*  ALT 87* 100* 63* 62* 56*  ALKPHOS 239* 315* 227* 227* 198*  BILITOT 1.2* 2.1* 1.5* 1.5* 1.0  PROT 4.4* 5.0* 5.0* 5.1* 4.9*  ALBUMIN 1.6* 1.9* 2.8* 3.0* 3.0*   No results for input(s): "LIPASE", "AMYLASE" in the last 168 hours. No results for input(s): "AMMONIA" in the last 168 hours.  CBC: Recent Labs  Lab 09/19/23 0822 09/21/23 1407 09/22/23 0511 09/23/23 0500 09/24/23 1103  WBC 1.8* 7.4 7.7 7.7 15.1*  NEUTROABS 0.6* 4.7  --   --   --   HGB 10.3* 12.2* 9.2* 8.9* 8.4*  HCT 29.3* 34.8* 25.8* 24.3* 23.7*  MCV 90.2 92.8 89.9 90.0 92.9  PLT 18* 59* 53* 61* 87*    Cardiac Enzymes: No results for input(s): "CKTOTAL", "CKMB", "CKMBINDEX", "TROPONINI" in the last 168 hours.  BNP: Invalid input(s): "POCBNP"  CBG: No results for input(s): "GLUCAP" in the last 168 hours.  Microbiology: Results for orders placed or performed during the hospital encounter of 09/21/23  Culture, blood (Routine x 2)     Status: None (Preliminary result)   Collection Time: 09/21/23  2:19 PM   Specimen: BLOOD  Result Value Ref Range Status   Specimen Description BLOOD PORTA CATH  Final   Special Requests   Final    BOTTLES DRAWN AEROBIC AND ANAEROBIC Blood Culture results may not be optimal due to an inadequate volume of blood received in culture bottles   Culture   Final    NO GROWTH 3 DAYS Performed at [facility], [address], [place], [place] [address]    Report Status PENDING  Incomplete  Culture, blood (Routine x 2)     Status: None (Preliminary result)   Collection Time: 09/21/23  2:19 PM   Specimen: BLOOD LEFT ARM  Result Value Ref Range Status   Specimen Description BLOOD LEFT ARM  Final   Special Requests   Final    BOTTLES DRAWN AEROBIC AND ANAEROBIC Blood Culture adequate volume   Culture   Final    NO GROWTH 3 DAYS Performed at [facility], [address],  [place], [place] [address]    Report Status PENDING  Incomplete    Coagulation Studies: Recent Labs    09/21/23 1609  LABPROT 20.9*  INR 1.8*    Urinalysis: Recent Labs    09/21/23 2007  COLORURINE AMBER*  LABSPEC 1.017  PHURINE 5.0  GLUCOSEU NEGATIVE  HGBUR NEGATIVE  BILIRUBINUR NEGATIVE  KETONESUR NEGATIVE  PROTEINUR NEGATIVE  NITRITE NEGATIVE  LEUKOCYTESUR NEGATIVE      Imaging: US THORACENTESIS ASP PLEURAL SPACE W/IMG GUIDE  Result Date: 09/23/2023 INDICATION: Patient with a history of lung cancer with recurrent pleural effusions. Interventional radiology asked to perform a therapeutic thoracentesis. EXAM: ULTRASOUND GUIDED THORACENTESIS MEDICATIONS: 1% lidocaine 15 mL COMPLICATIONS: None immediate. PROCEDURE: An ultrasound guided thoracentesis was thoroughly discussed with the patient and questions answered. The benefits, risks, alternatives and complications were also discussed. The patient understands and wishes to proceed with the procedure. Written consent was obtained. Ultrasound was performed to localize and mark an adequate pocket of fluid in the left chest. The area was then prepped and draped in the normal sterile fashion. 1% Lidocaine was used for local anesthesia. Under ultrasound guidance a 6 Fr Safe-T-Centesis catheter was introduced. Thoracentesis was performed. The catheter was removed and a dressing applied. FINDINGS: A total of approximately 450 mL of dark red fluid was removed. IMPRESSION: Successful ultrasound guided left thoracentesis yielding 450 mL of pleural fluid. Procedure performed by Alwyn Ren NP Electronically Signed   By: Judie Petit.  Shick M.D.   On: 09/23/2023 11:02   DG Chest Port 1 View  Result Date: 09/23/2023 CLINICAL DATA:  Status post left thoracentesis. EXAM: PORTABLE CHEST 1 VIEW COMPARISON:  September 21, 2023. FINDINGS: No pneumothorax is noted status post left thoracentesis. Left pleural effusion is slightly decreased compared to prior exam.  IMPRESSION: No pneumothorax status post left thoracentesis. Electronically Signed   By: Lupita Raider M.D.   On: 09/23/2023 10:54     Medications:      adenosine (ADENOCARD) IV  6 mg Intravenous Once   Chlorhexidine Gluconate Cloth  6 each  Topical Daily   fluticasone furoate-vilanterol  1 puff Inhalation Daily   folic acid  1 mg Oral Daily   ipratropium-albuterol  3 mL Nebulization Q6H   methylPREDNISolone (SOLU-MEDROL) injection  40 mg Intravenous Q12H   metoprolol tartrate  12.5 mg Oral BID   pantoprazole (PROTONIX) IV  40 mg Intravenous Q12H   sodium chloride flush  3 mL Intravenous Q12H   sodium chloride  2 g Oral TID   acetaminophen **OR** acetaminophen, ondansetron **OR** ondansetron (ZOFRAN) IV, polyethylene glycol  Assessment/ Plan:  Mr. [name] is a 42 y.o.  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 Hypervolemic.  -Patient given tolvaptan 15 mg yesterday, sodium recovered to 127. -Will give additional dose of tolvaptan today, sodium goal 1 32-1 33 - Continue sodium levels -Patient encouraged to limit water intake - Family reports nausea and vomiting with salt tablets, will consider alternative means at discharge.     LOS: 2 Rebbeca Sheperd 12/10/20241:24 PM

## 2023-09-24 NOTE — Consult Note (Addendum)
PHARMACY CONSULT NOTE - Tolvaptan  Pharmacy Consult for Tolvaptan Monitoring  Recent Labs: Potassium (mmol/L)  Date Value  09/24/2023 3.1 (L)   Magnesium (mg/dL)  Date Value  10/17/7251 2.0   Calcium (mg/dL)  Date Value  66/44/0347 7.7 (L)   Albumin (g/dL)  Date Value  42/59/5638 3.0 (L)   Phosphorus (mg/dL)  Date Value  75/64/3329 2.5   Sodium (mmol/L)  Date Value  09/24/2023 134 (L)    Assessment  [name] is a 42 y.o. male presenting with syncope. PMH significant for stage IV adenocarcinoma with brain, liver, bone metastasis, associated thrombocytopenia, Wolff-Parkinson-White syndrome.  Patient found to have serum Na of 121 on admission that worsened to 119. Pharmacy has been consulted to monitor tolvaptan as well as replace electrolytes.  Pertinent medications: N/A Drug interactions: N/A  12/9 0500 Na 121 12/9 1143 Tolvaptan 15 mg x1  12/9 1342 Na 121 12/9 1356 Na 121 12/9 1730 Na 120 12/10 0250 Na 127 12/10 1056 Tolvaptan 15 mg x 1 12/10 1103 Na 133 12/10 1853 Na 134  Goal of Therapy:  Na >130,Do not exceed increase in Na by 8 mEq/L per 8 hours or 12 mEq/L in 24 hours All other electrolytes WNL  Plan:  Na increase ~ 14 meq/L in ~24 hrs, clarified with nephrology, per MD ordered D5W 500 ml bolus x 1 K 3.1   will order KCL 40 meq po x 1  Will order BMP, Mag and Phos with AM labs tomorrow.  Bari Mantis PharmD Clinical Pharmacist 09/24/2023

## 2023-09-25 ENCOUNTER — Encounter: Payer: Self-pay | Admitting: Nurse Practitioner

## 2023-09-25 ENCOUNTER — Encounter: Payer: Self-pay | Admitting: Internal Medicine

## 2023-09-25 DIAGNOSIS — R9431 Abnormal electrocardiogram [ECG] [EKG]: Secondary | ICD-10-CM | POA: Diagnosis not present

## 2023-09-25 DIAGNOSIS — R55 Syncope and collapse: Secondary | ICD-10-CM | POA: Diagnosis not present

## 2023-09-25 DIAGNOSIS — C3492 Malignant neoplasm of unspecified part of left bronchus or lung: Secondary | ICD-10-CM | POA: Diagnosis not present

## 2023-09-25 LAB — HEPATIC FUNCTION PANEL
ALT: 54 U/L — ABNORMAL HIGH (ref 0–44)
AST: 52 U/L — ABNORMAL HIGH (ref 15–41)
Albumin: 2.8 g/dL — ABNORMAL LOW (ref 3.5–5.0)
Alkaline Phosphatase: 187 U/L — ABNORMAL HIGH (ref 38–126)
Bilirubin, Direct: 0.3 mg/dL — ABNORMAL HIGH (ref 0.0–0.2)
Indirect Bilirubin: 0.8 mg/dL (ref 0.3–0.9)
Total Bilirubin: 1.1 mg/dL (ref <1.2)
Total Protein: 4.3 g/dL — ABNORMAL LOW (ref 6.5–8.1)

## 2023-09-25 LAB — BASIC METABOLIC PANEL
Anion gap: 6 (ref 5–15)
Anion gap: 4 — ABNORMAL LOW (ref 5–15)
BUN: 12 mg/dL (ref 6–20)
BUN: 9 mg/dL (ref 6–20)
CO2: 25 mmol/L (ref 22–32)
CO2: 24 mmol/L (ref 22–32)
Calcium: 8.3 mg/dL — ABNORMAL LOW (ref 8.9–10.3)
Calcium: 7.5 mg/dL — ABNORMAL LOW (ref 8.9–10.3)
Chloride: 103 mmol/L (ref 98–111)
Chloride: 108 mmol/L (ref 98–111)
Creatinine, Ser: 0.74 mg/dL (ref 0.61–1.24)
Creatinine, Ser: 0.57 mg/dL — ABNORMAL LOW (ref 0.61–1.24)
GFR, Estimated: >60 mL/min (ref >60)
GFR, Estimated: >60 mL/min (ref >60)
Glucose, Bld: 147 mg/dL — ABNORMAL HIGH (ref 70–99)
Glucose, Bld: 135 mg/dL — ABNORMAL HIGH (ref 70–99)
Potassium: 3.7 mmol/L (ref 3.5–5.1)
Potassium: 3.3 mmol/L — ABNORMAL LOW (ref 3.5–5.1)
Sodium: 134 mmol/L — ABNORMAL LOW (ref 135–145)
Sodium: 136 mmol/L (ref 135–145)

## 2023-09-25 LAB — MAGNESIUM: Magnesium: 1.7 mg/dL (ref 1.7–2.4)

## 2023-09-25 LAB — CBC
HCT: 23.7 % — ABNORMAL LOW (ref 39.0–52.0)
Hemoglobin: 8.3 g/dL — ABNORMAL LOW (ref 13.0–17.0)
MCH: 32.5 pg (ref 26.0–34.0)
MCHC: 35 g/dL (ref 30.0–36.0)
MCV: 92.9 fL (ref 80.0–100.0)
Platelets: 80 10*3/uL — ABNORMAL LOW (ref 150–400)
RBC: 2.55 MIL/uL — ABNORMAL LOW (ref 4.22–5.81)
RDW: 17.2 % — ABNORMAL HIGH (ref 11.5–15.5)
WBC: 14.5 10*3/uL — ABNORMAL HIGH (ref 4.0–10.5)
nRBC: 1.4 % — ABNORMAL HIGH (ref 0.0–0.2)

## 2023-09-25 LAB — PHOSPHORUS: Phosphorus: 2.8 mg/dL (ref 2.5–4.6)

## 2023-09-25 LAB — GLUCOSE, CAPILLARY: Glucose-Capillary: 111 mg/dL — ABNORMAL HIGH (ref 70–99)

## 2023-09-25 LAB — PROTIME-INR
INR: 1.9 — ABNORMAL HIGH (ref 0.8–1.2)
Prothrombin Time: 22.2 s — ABNORMAL HIGH (ref 11.4–15.2)

## 2023-09-25 MED ORDER — SODIUM CHLORIDE 0.9 % IV SOLN
2.0000 g | Freq: Three times a day (TID) | INTRAVENOUS | Status: DC
  Administered 2023-09-25 – 2023-09-26 (×4): 2 g via INTRAVENOUS
  Filled 2023-09-25 (×5): qty 12.5

## 2023-09-25 MED ORDER — BISACODYL 5 MG PO TBEC
10.0000 mg | DELAYED_RELEASE_TABLET | Freq: Every day | ORAL | Status: DC
  Administered 2023-09-25: 10 mg via ORAL
  Filled 2023-09-25: qty 2

## 2023-09-25 MED ORDER — LACTULOSE 10 GM/15ML PO SOLN
30.0000 g | Freq: Three times a day (TID) | ORAL | Status: DC
  Administered 2023-09-25 – 2023-09-26 (×2): 30 g via ORAL
  Filled 2023-09-25 (×2): qty 60

## 2023-09-25 MED ORDER — METOPROLOL TARTRATE 25 MG PO TABS
25.0000 mg | ORAL_TABLET | Freq: Two times a day (BID) | ORAL | Status: DC
  Administered 2023-09-25 – 2023-09-26 (×2): 25 mg via ORAL
  Filled 2023-09-25 (×2): qty 1

## 2023-09-25 MED ORDER — MAGNESIUM SULFATE 2 GM/50ML IV SOLN
2.0000 g | Freq: Once | INTRAVENOUS | Status: AC
  Administered 2023-09-25: 2 g via INTRAVENOUS
  Filled 2023-09-25: qty 50

## 2023-09-25 NOTE — Progress Notes (Signed)
[facility]  ROUNDING NOTE   Subjective:   Patient seen resting quietly Wife at bedside States he was able to tolerate salt tabs crushed in applesauce Denies nausea or vomiting yesterday  Sodium 134 Urine output 1.35 L recorded on day shift yesterday  Objective:  Vital signs in last 24 hours:  Temp:  [97.8 F (36.6 C)-98.7 F (37.1 C)] 98.7 F (37.1 C) (12/11 0404) Pulse Rate:  [89-109] 102 (12/11 1137) Resp:  [18-20] 20 (12/11 0404) BP: (107-124)/(76-90) 111/83 (12/11 1137) SpO2:  [95 %-100 %] 97 % (12/11 1137)  Weight change:  Filed Weights   09/21/23 1340  Weight: 86.2 kg    Intake/Output: I/O last 3 completed shifts: In: 1491.2 [P.O.:600; I.V.:9.2; IV Piggyback:882] Out: 1650 [Urine:1650]   Intake/Output this shift:  No intake/output data recorded.  Physical Exam: General: NAD  Head: Normocephalic, atraumatic. Moist oral mucosal membranes  Eyes: Anicteric  Lungs:  Coarse,  room air  Heart: Regular rate and rhythm  Abdomen:  Soft, nontender  Extremities:  1+  peripheral edema.  Neurologic: Somnolent, moving all four extremities  Skin: No lesions       Basic Metabolic Panel: Recent Labs  Lab 09/21/23 1405 09/21/23 1407 09/22/23 0511 09/22/23 1314 09/23/23 0500 09/23/23 1342 09/24/23 0250 09/24/23 1103 09/24/23 1853 09/25/23 0411 09/25/23 1100  NA  --    < > 119*   < > 121*   < > 127* 133* 134* 134* 136  K  --    < > 3.6   < > 2.9*   < > 3.8 3.5 3.1* 3.7 3.3*  CL  --    < > 90*   < > 87*   < > 93* 100 104 103 108  CO2  --    < > 21*   < > 21*   < > 25 25 24 25 24   GLUCOSE  --    < > 89   < > 123*   < > 137* 147* 170* 147* 135*  BUN  --    < > 10   < > 12   < > 13 14 12 12 9   CREATININE  --    < > 0.82   < > 0.78   < > 0.82 0.74 0.71 0.74 0.57*  CALCIUM  --    < > 7.4*   < > 7.5*   < > 7.9* 8.2* 7.7* 8.3* 7.5*  MG 1.3*  --  1.3*  --  1.5*  --   --  2.0  --  1.7  --   PHOS  --   --  3.3  --  3.9  --   --  2.5  --  2.8  --    < >  = values in this interval not displayed.    Liver Function Tests: Recent Labs  Lab 09/21/23 1407 09/23/23 0500 09/23/23 1356 09/24/23 1103 09/25/23 0411  AST 75* 48* 51* 52* 52*  ALT 100* 63* 62* 56* 54*  ALKPHOS 315* 227* 227* 198* 187*  BILITOT 2.1* 1.5* 1.5* 1.0 1.1  PROT 5.0* 5.0* 5.1* 4.9* 4.3*  ALBUMIN 1.9* 2.8* 3.0* 3.0* 2.8*   No results for input(s): "LIPASE", "AMYLASE" in the last 168 hours. No results for input(s): "AMMONIA" in the last 168 hours.  CBC: Recent Labs  Lab 09/19/23 0822 09/21/23 1407 09/22/23 0511 09/23/23 0500 09/24/23 1103 09/25/23 0415  WBC 1.8* 7.4 7.7 7.7 15.1* 14.5*  NEUTROABS 0.6* 4.7  --   --   --   --  HGB 10.3* 12.2* 9.2* 8.9* 8.4* 8.3*  HCT 29.3* 34.8* 25.8* 24.3* 23.7* 23.7*  MCV 90.2 92.8 89.9 90.0 92.9 92.9  PLT 18* 59* 53* 61* 87* 80*    Cardiac Enzymes: No results for input(s): "CKTOTAL", "CKMB", "CKMBINDEX", "TROPONINI" in the last 168 hours.  BNP: Invalid input(s): "POCBNP"  CBG: No results for input(s): "GLUCAP" in the last 168 hours.  Microbiology: Results for orders placed or performed during the hospital encounter of 09/21/23  Culture, blood (Routine x 2)     Status: None (Preliminary result)   Collection Time: 09/21/23  2:19 PM   Specimen: BLOOD  Result Value Ref Range Status   Specimen Description BLOOD PORTA CATH  Final   Special Requests   Final    BOTTLES DRAWN AEROBIC AND ANAEROBIC Blood Culture results may not be optimal due to an inadequate volume of blood received in culture bottles   Culture   Final    NO GROWTH 4 DAYS Performed at [facility], [address], [place], [place] [address]    Report Status PENDING  Incomplete  Culture, blood (Routine x 2)     Status: None (Preliminary result)   Collection Time: 09/21/23  2:19 PM   Specimen: BLOOD LEFT ARM  Result Value Ref Range Status   Specimen Description BLOOD LEFT ARM  Final   Special Requests   Final    BOTTLES DRAWN AEROBIC AND  ANAEROBIC Blood Culture adequate volume   Culture   Final    NO GROWTH 4 DAYS Performed at [facility], [address] [place], [place] [address]    Report Status PENDING  Incomplete    Coagulation Studies: Recent Labs    09/25/23 0411  LABPROT 22.2*  INR 1.9*    Urinalysis: No results for input(s): "COLORURINE", "LABSPEC", "PHURINE", "GLUCOSEU", "HGBUR", "BILIRUBINUR", "KETONESUR", "PROTEINUR", "UROBILINOGEN", "NITRITE", "LEUKOCYTESUR" in the last 72 hours.  Invalid input(s): "APPERANCEUR"     Imaging: CT CHEST ABDOMEN PELVIS W CONTRAST  Result Date: 09/24/2023 CLINICAL DATA:  Follow-up non-small cell lung cancer EXAM: CT CHEST, ABDOMEN, AND PELVIS WITH CONTRAST TECHNIQUE: Multidetector CT imaging of the chest, abdomen and pelvis was performed following the standard protocol during bolus administration of intravenous contrast. RADIATION DOSE REDUCTION: This exam was performed according to the departmental dose-optimization program which includes automated exposure control, adjustment of the mA and/or kV according to patient size and/or use of iterative reconstruction technique. CONTRAST:  OMNIPAQUE IOHEXOL 300 MG/ML  SOLN COMPARISON:  CT abdomen/pelvis dated 08/13/2023. PET-CT dated 07/16/2023. FINDINGS: CT CHEST FINDINGS Cardiovascular: Heart is normal in size.  No pericardial effusion. No evidence of thoracic aortic aneurysm. Right chest port terminates at the cavoatrial junction. Mediastinum/Nodes: No suspicious mediastinal lymphadenopathy. Visualized thyroid is unremarkable. Lungs/Pleura: Moderate left pleural effusion with chronic pleural thickening. Mild complexity inferiorly (series 2/image 46). However, the effusion was non FDG avid on prior PET. Scarring/atelectasis in the lingula and left lower lobe. New patchy/ground-glass opacities in the right upper lobe, raising concern for atypical infection/pneumonia. Mild interlobular septal thickening in the right  middle and lower lobes, nonspecific. No pneumothorax. Musculoskeletal: Lytic metastasis in the posterior T3 vertebral body (series 6/image 75). CT ABDOMEN PELVIS FINDINGS Hepatobiliary: Lobular hepatic contour. Atrophy/scarring of the left hepatic lobe. Associated low-density in the left hepatic lobe suggests ill-defined metastatic disease (series 2/image 49), although difficult to measure. Additional 4.0 cm low-density lesion in segment 5 (series 2/image 57), similar to prior CT, although with only mild hypermetabolism on prior PET. Regardless,  residual hepatic metastases are favored. Gallbladder is unremarkable. No intrahepatic or extrahepatic duct dilatation. Pancreas: Within normal limits. Spleen: Within normal limits. Adrenals/Urinary Tract: Adrenal glands are within normal limits. Kidneys are within normal limits.  No hydronephrosis. Bladder is within normal limits. Stomach/Bowel: Stomach is within normal limits. No evidence of bowel obstruction. Normal appendix (series 2/image 90). No colonic wall thickening or inflammatory changes. Vascular/Lymphatic: No evidence of abdominal aortic aneurysm. No suspicious abdominopelvic lymphadenopathy. Reproductive: Prostate is unremarkable. Other: Moderate abdominal ascites, partially loculated. This was non FDG avid on prior PET. Moderate body wall edema, new. Musculoskeletal: No focal osseous lesions. IMPRESSION: New patchy/ground-glass opacities in the right upper lobe, raising concern for atypical infection/pneumonia. Moderate left pleural effusion with chronic pleural thickening, unchanged. Suspected hepatic metastases, ill-defined/poorly evaluated, with superimposed left hepatic lobe scarring/atrophy. This appearance is similar to the prior. Stable lytic metastasis in the posterior T3 vertebral body. Moderate abdominal ascites, unchanged. Moderate body wall edema, new. Electronically Signed   By: Charline Bills M.D.   On: 09/24/2023 20:03   US  Paracentesis  Result Date: 09/24/2023 INDICATION: History of metastatic lung cancer with recurrent malignant ascites. Request is for therapeutic paracentesis EXAM: ULTRASOUND GUIDED THERAPEUTIC PARACENTESIS MEDICATIONS: Lidocaine 1% 10 mL COMPLICATIONS: None immediate. PROCEDURE: Informed written consent was obtained from the patient after a discussion of the risks, benefits and alternatives to treatment. A timeout was performed prior to the initiation of the procedure. Initial ultrasound scanning demonstrates a small amount of ascites within the right lower abdominal quadrant. The right lower abdomen was prepped and draped in the usual sterile fashion. 1% lidocaine was used for local anesthesia. Following this, a 19 gauge, 7-cm, Yueh catheter was introduced. An ultrasound image was saved for documentation purposes. The paracentesis was performed. The catheter was removed and a dressing was applied. The patient tolerated the procedure well without immediate post procedural complication. Patient received post-procedure intravenous albumin; see nursing notes for details. FINDINGS: A total of approximately 2.7 L of straw-colored fluid was removed. Samples were sent to the laboratory as requested by the clinical team. IMPRESSION: Successful ultrasound-guided therapeutic paracentesis yielding 2.7 liters of peritoneal fluid. Performed by Anders Grant NP Electronically Signed   By: Judie Petit.  Shick M.D.   On: 09/24/2023 16:04     Medications:    azithromycin Stopped (09/24/23 1752)   ceFEPime (MAXIPIME) IV 2 g (09/25/23 1107)     adenosine (ADENOCARD) IV  6 mg Intravenous Once   Chlorhexidine Gluconate Cloth  6 each Topical Daily   fluticasone furoate-vilanterol  1 puff Inhalation Daily   folic acid  1 mg Oral Daily   ipratropium-albuterol  3 mL Nebulization Q6H   metoprolol tartrate  25 mg Oral BID   pantoprazole (PROTONIX) IV  40 mg Intravenous Q12H   sodium chloride flush  3 mL Intravenous Q12H    acetaminophen **OR** acetaminophen, ondansetron **OR** ondansetron (ZOFRAN) IV, polyethylene glycol, traZODone  Assessment/ Plan:  Mr. [name] is a 42 y.o.  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 Hypervolemic.  -Patient has received 2 doses of tolvaptan 15 mg during this admission - Sodium 134 today - Patient did receive 500 mL D5W bolus overnight to stabilize correction - Will continue salt tablets 1 g 3 times daily - With the  patient's current comorbidities, will be difficult to maintain an appropriate sodium level.  Patient specific goal 128-132 or above. - Patient encouraged to maintain strict fluid restriction 1 to 1.2 L daily. - Can consider oral furosemide if sodium begins to decrease.     LOS: 3 Carmela Piechowski 12/11/20241:03 PM

## 2023-09-25 NOTE — Plan of Care (Signed)

## 2023-09-25 NOTE — Progress Notes (Signed)
[facility] Progress Note  Patient: [name]    YNW:[id number]  DOA: 09/21/2023     Date of Service: the patient was seen and examined on 09/25/2023  Chief Complaint  Patient presents with   Loss of Consciousness   Brief hospital course: [name] is a 42 y.o. male with medical history significant of stage IV lung adenocarcinoma with brain, liver, bone metastasis on Carboplatin-Alimta-Amivatimab, associated thrombocytopenia, Wolff-Parkinson-White syndrome on metoprolol, who presents to the ED due to syncope.  Patient is having nausea and vomiting for a few days, today patient was noted to have significant dyspnea on exertion when he went out for lunch.  Patient got home back and his legs gave out, suddenly LOC and patient passed out, caught by his father and wife, no trauma.  Patient regained consciousness quickly.  No seizure activity.  ED workup: VS HR 130 tachycardia, RR 22 tachypneic, BP 118/83, 99% on 2 L, afebrile BMP hyponatremia sodium 122, calcium 7.0 low could be due to low albumin 1.9 mildly elevated AST and ALT, bilirubin 2.1 elevated Lactic acidosis lactic acid 6.0 most likely due to intravascular volume depletion Chronic anemia hemoglobin 12.2, thrombocytopenia platelet 59 Blood culture sent CXR: Slight increase in size of a large laterally loculated left pleural effusion with associated chronic compressive left lung atelectasis. No other significant changes.   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 3 times daily, d/c'd on 12/10 Monitor BMP every 8 hourly Goal to raise sodium level 8 to 10 mEq in 24 hours Nephrology consulted, s/p tolvaptan 15 milligram daily x 2 doses    # Recurrent malignant left pleural effusion 11/25 s/p thoracentesis 12/9 s/p left thora dark red fluid was  tapped, No pneumothorax status post left thoracentesis.  S/p IV Lasix gtt, d/c'd by nephro on 12/9 S/p IV albumin 12.5 g every 6 hourly for 2 days till 12/10 IR consulted to repeat thoracentesis   # Anasarca due to liver cirrhosis and Ascites  12/3 S/p paracentesis 12/10 s/p paracentesis 2.7 L fluid was tapped Monitor for recurrence S/p IV Lasix gtt, d/c'd by nephro on 12/9 S/p IV albumin 12.5 g every 6 hourly for 2 days till 12/10   # COPD exacerbation possible pneumonia 12/8 patient was wheezing on my exam Started Solu-Medrol IV 40 mg Q12 hourly for 3 days 12/10 started azithromycin 40 mg IV daily for 5 days PPI for GI prophylaxis DuoNeb every 6 hourly Breo Ellipta inhaler 12/11 started cefepime 2 g IV every 8 hourly for possible pneumonia on CT chest, immunocompromise status.   # Syncope, likely due to orthostatic hypotension Patient has intravascular volume depletion due to low albumin S/p 2 L bolus given in the ED Due to liver cirrhosis and peripheral edema patient was given albumin 12.5 g every 6 hourly x 3 doses Continue fall precautions, monitor orthostatics Continue supportive care  # Lactic acidosis and metabolic acidosis most likely due to intractable vomiting and dehydration  # Intractable nausea and vomiting S/p vancomycin, cefepime and Flagyl 1 dose given in the ED, no more need of antibiotics, no signs of infection. S/p IV fluid given, lactic acidosis resolved Monitor bicarb level Continue symptomatic treatment for nausea and vomiting Started pantoprazole 40 mg IV twice daily for GI prophylaxis  # Hypokalemia, potassium repleted. # Hypomagnesemia, mag repleted. Monitor electrolytes and replete as needed.  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  # Transaminitis, decompensated liver cirrhosis Monitor LFTs and bilirubin  # WPW (Wolff-Parkinson-White syndrome) Long-term history of Wolff-Parkinson-White syndrome on metoprolol.  Syncopal episode consistent with orthostasis, however will monitor on telemetry overnight - Telemetry monitoring - s/p home metoprolol 12.5 mg p.o. twice daily 12/10 early morning patient had an episode of tachycardia, received adenosine 6 mg x 1 dose, Lopressor 5 mg IV, Cardizem 10 mg IV cardiology consulted 12/11 cardiology increased dose of metoprolol 25 mg p.o. twice daily  # Depression, Home meds Zoloft and olanzapine d/c'd on 12/9 due to possible causing SIADH and hyponatremia.  Resume after sodium correction  # Adenocarcinoma of lung, stage 4, left History of stage IV adenocarcinoma of the left lung with metastasis to the pleural space, liver, brain, bone.  Currently undergoing chemotherapy and is hoping to get a second opinion at [facility] consulted 12/10 follow-up CT abdomen & pelvis  Overall long-term prognosis is poor.  Goals of care discussed patient's wife at bedside, who agreed with DNR/DNI and palliative care will be consulted.  We will continue medical management, if patient's condition does not improve or deteriorates then may transition to hospice care.  Body mass index is 27.26 kg/m.  Interventions:  Diet: Regular diet DVT Prophylaxis: SCD, pharmacological prophylaxis contraindicated due to thrombocytopenia, coagulopathy    Advance goals of care discussion: DNR/DNI-Limited  Family Communication: family was present at bedside, at the time of interview.  The pt provided permission to discuss medical plan with the family. Opportunity was given to ask question and all questions were answered satisfactorily.   Disposition:  Pt is from Home, admitted with Syncope, hyponatremia, stage IV metastatic cancer, still has low sodium, which precludes a  safe discharge. Discharge to home, when stable.  Subjective: No significant overnight events, patient denies any nausea vomiting, has not moved bowels yet since Saturday. Denies any abdominal pain  Physical Exam: General: NAD, lying comfortably Appear in no distress, affect appropriate Eyes: PERRLA ENT: Oral Mucosa Clear, moist  Neck: no JVD,  Cardiovascular: S1 and S2 Present, no Murmur,  Respiratory: Equal air entry bilaterally, bilateral wheezing, bibasilar crackles Abdomen: Bowel Sound present, Soft but slightly distended due to ascites, no tenderness,  Skin: no rashes Extremities: 3-4+ pedal edema, no calf tenderness Neurologic: without any new focal findings Gait not checked due to patient safety concerns  Vitals:   09/25/23 0808 09/25/23 1137 09/25/23 1347 09/25/23 1541  BP: 107/89 111/83  109/77  Pulse: (!) 102 (!) 102  (!) 110  Resp:      Temp:      TempSrc:      SpO2: 100% 97% 93% 99%  Weight:      Height:        Intake/Output Summary (Last 24 hours) at 09/25/2023 1800 Last data filed at 09/25/2023 1500 Gross per 24 hour  Intake 590 ml  Output --  Net 590 ml   Filed Weights   09/21/23 1340  Weight: 86.2 kg    Data Reviewed: I have personally reviewed and interpreted daily labs, tele strips, imagings as discussed above. I reviewed all nursing notes, pharmacy notes, vitals, pertinent old records I have discussed plan of care as described above with RN and patient/family.  CBC: Recent Labs  Lab  09/19/23 0822 09/21/23 1407 09/22/23 0511 09/23/23 0500 09/24/23 1103 09/25/23 0415  WBC 1.8* 7.4 7.7 7.7 15.1* 14.5*  NEUTROABS 0.6* 4.7  --   --   --   --   HGB 10.3* 12.2* 9.2* 8.9* 8.4* 8.3*  HCT 29.3* 34.8* 25.8* 24.3* 23.7* 23.7*  MCV 90.2 92.8 89.9 90.0 92.9 92.9  PLT 18* 59* 53* 61* 87* 80*   Basic Metabolic Panel: Recent Labs  Lab 09/21/23 1405 09/21/23 1407 09/22/23 0511 09/22/23 1314 09/23/23 0500 09/23/23 1342 09/24/23 0250  09/24/23 1103 09/24/23 1853 09/25/23 0411 09/25/23 1100  NA  --    < > 119*   < > 121*   < > 127* 133* 134* 134* 136  K  --    < > 3.6   < > 2.9*   < > 3.8 3.5 3.1* 3.7 3.3*  CL  --    < > 90*   < > 87*   < > 93* 100 104 103 108  CO2  --    < > 21*   < > 21*   < > 25 25 24 25 24   GLUCOSE  --    < > 89   < > 123*   < > 137* 147* 170* 147* 135*  BUN  --    < > 10   < > 12   < > 13 14 12 12 9   CREATININE  --    < > 0.82   < > 0.78   < > 0.82 0.74 0.71 0.74 0.57*  CALCIUM  --    < > 7.4*   < > 7.5*   < > 7.9* 8.2* 7.7* 8.3* 7.5*  MG 1.3*  --  1.3*  --  1.5*  --   --  2.0  --  1.7  --   PHOS  --   --  3.3  --  3.9  --   --  2.5  --  2.8  --    < > = values in this interval not displayed.    Studies: No results found.  Scheduled Meds:  adenosine (ADENOCARD) IV  6 mg Intravenous Once   Chlorhexidine Gluconate Cloth  6 each Topical Daily   fluticasone furoate-vilanterol  1 puff Inhalation Daily   folic acid  1 mg Oral Daily   metoprolol tartrate  25 mg Oral BID   pantoprazole (PROTONIX) IV  40 mg Intravenous Q12H   sodium chloride flush  3 mL Intravenous Q12H   Continuous Infusions:  azithromycin 500 mg (09/25/23 1606)   ceFEPime (MAXIPIME) IV Stopped (09/25/23 1142)   PRN Meds: acetaminophen **OR** acetaminophen, ondansetron **OR** ondansetron (ZOFRAN) IV, polyethylene glycol, traZODone  Time spent: 40 minutes  Author: Gillis Santa. MD [facility] 09/25/2023 6:00 PM  To reach On-call, see care teams to locate the attending and reach out to them via [email or web address]. If 7PM-7AM, please contact night-coverage If you still have difficulty reaching the attending provider, please page the [facility] (Director on Call) for [facility] on amion for assistance.

## 2023-09-25 NOTE — Consult Note (Signed)
Pharmacy Antibiotic Note  [name] is a 42 y.o. male admitted on 09/21/2023 with  HCAP . Patient with PMH significant for stage IV adenocarcinoma with brain, liver, bone metastasis, associated thrombocytopenia and Wolff-Parkinson-White syndrome was admitted 4 days ago after syncope/loss of consciousness. Pharmacy has been consulted today 12/11 for cefepime dosing.   Plan: Cefepime 2gm IV q 8hrs  Height: 5\' 10"  (177.8 cm) Weight: 86.2 kg (190 lb) IBW/kg (Calculated) : 73  Temp (24hrs), Avg:98 F (36.7 C), Min:97.8 F (36.6 C), Max:98.7 F (37.1 C)  Recent Labs  Lab 09/21/23 1407 09/21/23 1609 09/21/23 2104 09/21/23 2300 09/22/23 0511 09/22/23 1314 09/23/23 0500 09/23/23 1342 09/23/23 1356 09/24/23 0250 09/24/23 1103 09/24/23 1853 09/25/23 0411 09/25/23 0415  WBC 7.4  --   --   --  7.7  --  7.7  --   --   --  15.1*  --   --  14.5*  CREATININE 1.00  --    < >  --  0.82   < > 0.78   < > 0.78 0.82 0.74 0.71 0.74  --   LATICACIDVEN 6.0* 4.4*  --  1.6  --   --   --   --   --   --   --   --   --   --    < > = values in this interval not displayed.    Estimated Creatinine Clearance: 121.7 mL/min (by C-G formula based on SCr of 0.74 mg/dL).    Allergies  Allergen Reactions   Codeine     Makes pt hyper    Antimicrobials this admission: azithromycin 12./10 >>  cefepime 12/10 >>   Dose adjustments this admission: n/a  Microbiology results: 12/7 BCx: NGTD  Thank you for allowing pharmacy to be a part of this patient's care.  Aksh Swart Rodriguez-Guzman PharmD, BCPS 09/25/2023 8:49 AM

## 2023-09-25 NOTE — Progress Notes (Signed)
[name]   DOB:[birth year]   JX#:[id number]    Subjective: Overnight patient had no acute events or any further episodes of tachycardia and anemia.  Patient's status post cardiology evaluation otherwise at this time patient resting in the bed comfortably.  Is currently on room air.  Accompanied by his father and wife.  Objective:  Vitals:   09/25/23 0808 09/25/23 1137  BP: 107/89 111/83  Pulse: (!) 102 (!) 102  Resp:    Temp:    SpO2: 100% 97%     Intake/Output Summary (Last 24 hours) at 09/25/2023 1253 Last data filed at 09/24/2023 2050 Gross per 24 hour  Intake 490 ml  Output 850 ml  Net -360 ml    Bilateral leg swelling.  Physical Exam Vitals and nursing note reviewed.  HENT:     Head: Normocephalic and atraumatic.     Mouth/Throat:     Pharynx: Oropharynx is clear.  Eyes:     Extraocular Movements: Extraocular movements intact.     Pupils: Pupils are equal, round, and reactive to light.  Cardiovascular:     Rate and Rhythm: Normal rate and regular rhythm.  Pulmonary:     Comments: Decreased breath sounds bilaterally.  Abdominal:     Palpations: Abdomen is soft.  Musculoskeletal:        General: Normal range of motion.     Cervical back: Normal range of motion.  Skin:    General: Skin is warm.  Neurological:     General: No focal deficit present.     Mental Status: He is alert and oriented to person, place, and time.  Psychiatric:        Behavior: Behavior normal.        Judgment: Judgment normal.      Labs:  Lab Results  Component Value Date   WBC 14.5 (H) 09/25/2023   HGB 8.3 (L) 09/25/2023   HCT 23.7 (L) 09/25/2023   MCV 92.9 09/25/2023   PLT 80 (L) 09/25/2023   NEUTROABS 4.7 09/21/2023    Lab Results  Component Value Date   NA 136 09/25/2023   K 3.3 (L) 09/25/2023   CL 108 09/25/2023   CO2 24 09/25/2023    Studies:  CT CHEST ABDOMEN PELVIS W CONTRAST  Result Date: 09/24/2023 CLINICAL DATA:  Follow-up non-small cell lung cancer  EXAM: CT CHEST, ABDOMEN, AND PELVIS WITH CONTRAST TECHNIQUE: Multidetector CT imaging of the chest, abdomen and pelvis was performed following the standard protocol during bolus administration of intravenous contrast. RADIATION DOSE REDUCTION: This exam was performed according to the departmental dose-optimization program which includes automated exposure control, adjustment of the mA and/or kV according to patient size and/or use of iterative reconstruction technique. CONTRAST:  OMNIPAQUE IOHEXOL 300 MG/ML  SOLN COMPARISON:  CT abdomen/pelvis dated 08/13/2023. PET-CT dated 07/16/2023. FINDINGS: CT CHEST FINDINGS Cardiovascular: Heart is normal in size.  No pericardial effusion. No evidence of thoracic aortic aneurysm. Right chest port terminates at the cavoatrial junction. Mediastinum/Nodes: No suspicious mediastinal lymphadenopathy. Visualized thyroid is unremarkable. Lungs/Pleura: Moderate left pleural effusion with chronic pleural thickening. Mild complexity inferiorly (series 2/image 46). However, the effusion was non FDG avid on prior PET. Scarring/atelectasis in the lingula and left lower lobe. New patchy/ground-glass opacities in the right upper lobe, raising concern for atypical infection/pneumonia. Mild interlobular septal thickening in the right middle and lower lobes, nonspecific. No pneumothorax. Musculoskeletal: Lytic metastasis in the posterior T3 vertebral body (series 6/image 75). CT ABDOMEN PELVIS FINDINGS Hepatobiliary: Lobular hepatic contour.  Atrophy/scarring of the left hepatic lobe. Associated low-density in the left hepatic lobe suggests ill-defined metastatic disease (series 2/image 49), although difficult to measure. Additional 4.0 cm low-density lesion in segment 5 (series 2/image 57), similar to prior CT, although with only mild hypermetabolism on prior PET. Regardless, residual hepatic metastases are favored. Gallbladder is unremarkable. No intrahepatic or extrahepatic duct  dilatation. Pancreas: Within normal limits. Spleen: Within normal limits. Adrenals/Urinary Tract: Adrenal glands are within normal limits. Kidneys are within normal limits.  No hydronephrosis. Bladder is within normal limits. Stomach/Bowel: Stomach is within normal limits. No evidence of bowel obstruction. Normal appendix (series 2/image 90). No colonic wall thickening or inflammatory changes. Vascular/Lymphatic: No evidence of abdominal aortic aneurysm. No suspicious abdominopelvic lymphadenopathy. Reproductive: Prostate is unremarkable. Other: Moderate abdominal ascites, partially loculated. This was non FDG avid on prior PET. Moderate body wall edema, new. Musculoskeletal: No focal osseous lesions. IMPRESSION: New patchy/ground-glass opacities in the right upper lobe, raising concern for atypical infection/pneumonia. Moderate left pleural effusion with chronic pleural thickening, unchanged. Suspected hepatic metastases, ill-defined/poorly evaluated, with superimposed left hepatic lobe scarring/atrophy. This appearance is similar to the prior. Stable lytic metastasis in the posterior T3 vertebral body. Moderate abdominal ascites, unchanged. Moderate body wall edema, new. Electronically Signed   By: Charline Bills M.D.   On: 09/24/2023 20:03   US Paracentesis  Result Date: 09/24/2023 INDICATION: History of metastatic lung cancer with recurrent malignant ascites. Request is for therapeutic paracentesis EXAM: ULTRASOUND GUIDED THERAPEUTIC PARACENTESIS MEDICATIONS: Lidocaine 1% 10 mL COMPLICATIONS: None immediate. PROCEDURE: Informed written consent was obtained from the patient after a discussion of the risks, benefits and alternatives to treatment. A timeout was performed prior to the initiation of the procedure. Initial ultrasound scanning demonstrates a small amount of ascites within the right lower abdominal quadrant. The right lower abdomen was prepped and draped in the usual sterile fashion. 1%  lidocaine was used for local anesthesia. Following this, a 19 gauge, 7-cm, Yueh catheter was introduced. An ultrasound image was saved for documentation purposes. The paracentesis was performed. The catheter was removed and a dressing was applied. The patient tolerated the procedure well without immediate post procedural complication. Patient received post-procedure intravenous albumin; see nursing notes for details. FINDINGS: A total of approximately 2.7 L of straw-colored fluid was removed. Samples were sent to the laboratory as requested by the clinical team. IMPRESSION: Successful ultrasound-guided therapeutic paracentesis yielding 2.7 liters of peritoneal fluid. Performed by Anders Grant NP Electronically Signed   By: Judie Petit.  Shick M.D.   On: 09/24/2023 46:55    # 43 year old Caucasian male patient with history of metastatic adenocarcinoma of the lung-is currently admitted to hospital for syncopal episode; noted to have severe hyponatremia hypokalemia.   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 Malignant ascites/malignant pleural effusion status post-paracentesis/ Thoracentesis. 12/10 2024- CT scan chest and pelvis -radiologically overall stable; however I am concerned that the right upper lung groundglass opacity concerning for lymphangitic spread/progressive disease.  Currently on antibiotics. #  Will also check with pathology regarding HER2 testing by IHC/or NGS testing on current available tissue.  As per the discussion with the doctor at [facility]-currently patient poor candidate for any clinical trial/given his decompensation- see below  # Acute respiratory failure-underlying malignancy/wheezing-agree with steroids and nebulization.-Right upper lobe groundglass opacity-lymphangitic spread versus infection-continue antibiotics for now.  # SVT-/history of WPW-status  post Adenosine-status post cardiology evaluation-on metoprolol stable.Marland Kitchen   #  Electrolyte abnormalities: acute on chronic hyponatremia-dehydration/[third spacing-secondary to hypoalbuminemia/cirrhosis] status post evaluation with nephrology on ADH Antagonist-slowly improving.  Severe hypokalemia improved.  # Anemia thrombocytopenia likely secondary to chemotherapy slowly improving.     # DNR/DNI  # PROGNOSIS: I had a long discussion with the patient regarding the fact that patient's clinical status improved-placed on temporary measures as underlying cancer seems to be progressive/resistant to any further treatment.  Discussed that the benefits of further chemotherapy are quite limited to high risk of side effects.  Also discussed hospice-given in general his life expectancy is much less than 6 months.  However after the lengthy discussion patient seems to contradict my clinical impression, and that he wants to keep "fighting".  For now we will continue current scope of care.  Discussed with Elouise Munroe; I also discussed with primary service Dr. Lucianne Muss.  [name], MD 09/25/2023  12:53 PM

## 2023-09-25 NOTE — Consult Note (Signed)
PHARMACY CONSULT NOTE - Tolvaptan & Electrolytes  Pharmacy Consult for Tolvaptan Monitoring  Recent Labs: Potassium (mmol/L)  Date Value  09/25/2023 3.7   Magnesium (mg/dL)  Date Value  32/95/1884 1.7   Calcium (mg/dL)  Date Value  16/60/6301 8.3 (L)   Albumin (g/dL)  Date Value  60/07/9322 2.8 (L)   Phosphorus (mg/dL)  Date Value  55/73/2202 2.8   Sodium (mmol/L)  Date Value  09/25/2023 134 (L)    Assessment  [name] is a 42 y.o. male presenting with syncope. PMH significant for stage IV adenocarcinoma with brain, liver, bone metastasis, associated thrombocytopenia, Wolff-Parkinson-White syndrome.  Patient found to have serum Na of 121 on admission that worsened to 119. Pharmacy has been consulted to monitor tolvaptan as well as replace electrolytes.  Pertinent medications: N/A Drug interactions: N/A  Date Time Result/comment 12/9 0500  Na 121 12/9  1143  Tolvaptan 15 mg x1  12/9 1342  Na 121 12/9  1356  Na 121 12/9  1730  Na 120 12/10  0250  Na 127 12/10  1056  Tolvaptan 15 mg x 1 12/10  1103  Na 133 12/10  1853  Na 134 12/11  0411 Na 134  Goal of Therapy:  Na 132-133,Do not exceed increase in Na by 8 mEq/L per 8 hours or 12 mEq/L in 24 hours All other electrolytes WNL  Plan:  Na = 134, increased 7 meq/L in 24 hrs after 2nd tolvaptan dose (appropriate correction rate and now at desired goal). Will continue to monitor Na every 24hrs. K 3.7, Mg 1.7, Phos 2.8. Will replace Mg with MgSulfate 2gm IV x 1 today. No additional replacement needed at this time. Will order BMP with AM labs.  Ned Kakar Rodriguez-Guzman PharmD, BCPS 09/25/2023 7:21 AM

## 2023-09-25 NOTE — Evaluation (Signed)
Physical Therapy Evaluation Patient Details Name: [name] MRN: [id number] DOB: [birth year] Today's Date: 09/25/2023  History of Present Illness  Pt is a 42 yo M who presented to the ED due to syncope. 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  Clinical Impression  Pt was pleasant and motivated to participate during the session and put forth good effort throughout. Pt received in bed, A,O x4, and able to give detailed history. Pt completed bed mobility with Superv and education on log roll technique, demonstrating good understanding. Pt performed transfer with Superv for safety, with no real difficulty. Pt amb with RW and Superv in hall, exhibiting safe gait and good AD management. PT amb shorter distance with no AD and CGA, exhibiting mild unsteadiness but safe without assist. Pt's vitals were monitored during session and remained WNL on RA, SpO2 > 90% throughout session. BP taken in supine, seated, and x2 in standing, negative for orthostatic hypotension, and pt reporting no adverse symptoms. Pt will benefit from continued PT services upon discharge to safely address deficits listed in patient problem list for decreased caregiver assistance and eventual return to PLOF.          If plan is discharge home, recommend the following: A little help with bathing/dressing/bathroom;A little help with walking and/or transfers;Assistance with cooking/housework;Assist for transportation;Help with stairs or ramp for entrance   Can travel by private vehicle        Equipment Recommendations None recommended by PT  Recommendations for Other Services       Functional Status Assessment Patient has had a recent  decline in their functional status and demonstrates the ability to make significant improvements in function in a reasonable and predictable amount of time.     Precautions / Restrictions Precautions Precautions: Fall Restrictions Weight Bearing Restrictions: No      Mobility  Bed Mobility Overal bed mobility: Needs Assistance Bed Mobility: Rolling, Sidelying to Sit Rolling: Supervision, Used rails Sidelying to sit: Supervision, HOB elevated, Used rails       General bed mobility comments: pt reported baseline difficulty with getting in/out of bed, opted to teach log rolling method; pt rolling toward L easily with light rail use, performing fairly quick sidelying>sit with mod-use of BUEs, not requiring substantial effort    Transfers Overall transfer level: Needs assistance Equipment used: Rolling walker (2 wheels) Transfers: Sit to/from Stand Sit to Stand: Supervision           General transfer comment: requiring extra time for set up and completion, fair strength noted in BLEs and BUEs with pushoff needing cuing for hand placement, minimal unsteadiness during ascent but steady upon standing    Ambulation/Gait Ambulation/Gait assistance: Supervision, Contact guard assist Gait Distance (Feet): 240 Feet Assistive device: Rolling walker (2 wheels), None Gait Pattern/deviations: Step-through pattern, Decreased step length - right, Decreased step length - left Gait velocity: minimal decrease with RW, decreased with no AD     General Gait Details: ~180 feet with RW exhibiting consistent cadence and safe gait pattern, nearly reciprocal pattern L-R, cuing for posture, RW management; ~60 feet with no AD with slight decrease in step lengths, mild unsteadiness throughout but overall safe without assist, reporting dislike of RW 2/2 feeling unusual  Careers information officer  Tilt Bed    Modified Rankin (Stroke Patients Only)       Balance Overall  balance assessment: Needs assistance Sitting-balance support: Feet supported, No upper extremity supported Sitting balance-Leahy Scale: Good     Standing balance support: During functional activity, Bilateral upper extremity supported, No upper extremity supported Standing balance-Leahy Scale: Fair Standing balance comment: steady with RW throughout static and dynamic act; with no AD, mild unsteadiness only during dynamic activities                             Pertinent Vitals/Pain Pain Assessment Pain Assessment: No/denies pain    Home Living Family/patient expects to be discharged to:: Private residence Living Arrangements: Spouse/significant other;Children Available Help at Discharge: Family;Available 24 hours/day (wife, adult daughter, parents) Type of Home: House Home Access: Stairs to enter Entrance Stairs-Rails: Left Entrance Stairs-Number of Steps: 2.5 at garage   Home Layout: One level Home Equipment: Shower seat;Grab bars - tub/shower;Grab bars - toilet      Prior Function Prior Level of Function : Needs assist             Mobility Comments: ind limited community amb; admitting recent near fall/stagger due to tripping but no hard falls ADLs Comments: needing intermittent assist with LB dressing, needing assist for housework, grocery shopping, errands     Extremity/Trunk Assessment   Upper Extremity Assessment Upper Extremity Assessment: Defer to OT evaluation;Overall [facility] for tasks assessed    Lower Extremity Assessment Lower Extremity Assessment: Generalized weakness       Communication   Communication Communication: No apparent difficulties Cueing Techniques: Verbal cues;Visual cues  Cognition Arousal: Alert Behavior During Therapy: WFL for tasks assessed/performed Overall Cognitive Status: Within Functional Limits for tasks assessed                                          General Comments      Exercises Other  Exercises Other Exercises: educated pt on log roll technique for safe and efficient bed mobility   Assessment/Plan    PT Assessment Patient needs continued PT services  PT Problem List Decreased strength;Decreased range of motion;Decreased activity tolerance;Decreased balance;Decreased knowledge of use of DME;Decreased coordination;Decreased mobility;Cardiopulmonary status limiting activity       PT Treatment Interventions DME instruction;Therapeutic exercise;Balance training;Gait training;Stair training;Functional mobility training;Therapeutic activities;Patient/family education    PT Goals (Current goals can be found in the Care Plan section)  Acute Rehab PT Goals Patient Stated Goal: get home PT Goal Formulation: With patient Time For Goal Achievement: 10/08/23 Potential to Achieve Goals: Good    Frequency Min 1X/week     Co-evaluation               AM-PAC PT "6 Clicks" Mobility  Outcome Measure Help needed turning from your back to your side while in a flat bed without using bedrails?: None Help needed moving from lying on your back to sitting on the side of a flat bed without using bedrails?: A Little Help needed moving to and from a bed to a chair (including a wheelchair)?: A Little Help needed standing up from a chair using your arms (e.g., wheelchair or bedside chair)?: None Help needed to walk in hospital room?: A Little Help needed climbing 3-5 steps with a railing? : A Little 6 Click Score: 20    End of  Session Equipment Utilized During Treatment: Gait belt Activity Tolerance: Patient tolerated treatment well Patient left: in chair;with call bell/phone within reach;with chair alarm set;with family/visitor present Nurse Communication: Mobility status PT Visit Diagnosis: Difficulty in walking, not elsewhere classified (R26.2);Muscle weakness (generalized) (M62.81)    Time: 3151-7616 PT Time Calculation (min) (ACUTE ONLY): 42 min   Charges:                Rosiland Oz SPT 09/25/23, 1:58 PM

## 2023-09-25 NOTE — Consult Note (Signed)
[facility] CLINIC CARDIOLOGY CONSULT NOTE       Patient ID: [name] MRN: [id number] DOB/AGE: 44-Apr-1982 44 y.o.  Admit date: 09/21/2023 Referring Physician Dr. Gillis Santa Primary Physician Pcp, No  Primary Cardiologist Dr. Lady Gary (last seen 2022) Reason for Consultation SVT  HPI: [name] is a 42 y.o. male  with a past medical history of stage IV lung adenocarcinoma with brain, liver, bone metastasis on Carboplatin-Alimta-Amivatimab, associated thrombocytopenia, Wolff-Parkinson-White syndrome on metoprolol who presented to the ED on 09/21/2023 for syncope. Cardiology was consulted for further evaluation.   Patient resting comfortably on exam, majority of history provided by wife who is present at bedside.  She reports that he has a longstanding history of WPW, has only had a few episodes of SVT since they have been married.  She reports that he can usually tell when he is going to have an episode and self convert with carotid massage and breathing techniques.  He was admitted to the hospital on 12/7 for syncopal episode determined to be related from hyponatremia, volume depletion.  He was started on IV Lasix, IV albumin and sodium tablets.  Overnight on 12/10 the patient had gotten a bath and was getting back in bed when he noticed that his heart "tripped" and he went into SVT with rates up to the 180s.  He was treated with 5 mg of metoprolol initially, 500 mg bolus of normal saline, 10 mg of IV diltiazem but did not convert he was then given 6 mg of adenosine and converted to normal sinus rhythm.  At the time of my evaluation this morning patient is resting comfortably in hospital bed with wife and mother present at bedside.  We discussed his recent hospitalizations and current underlying medical conditions.  He currently has stage IV adenocarcinoma of the lung and has been undergoing chemotherapy.  Discussed his history of WPW and prior SVT episodes as well as his episode from early  yesterday morning.  They deny any recurrence since this episode and states that he has not complained of any chest pain, shortness of breath, or palpitations.  His heart rate has remained relatively controlled since his episode.  He is prescribed p.o. metoprolol at home but his wife reports that he had not been able to tolerate any p.o. intake due to nausea and vomiting thus had not been taking this prior to presentation to the ED.  He was restarted on this on 12/7 when he was admitted.  Review of systems complete and found to be negative unless listed above    Past Medical History:  Diagnosis Date   Cancer (HCC)    Dyspnea    Heartburn    Pneumonia    Wolff-Parkinson-White (WPW) 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    Past Surgical History:  Procedure Laterality Date   FRACTURE SURGERY     broke femur when he was 6 years   PORTA CATH INSERTION N/A 03/28/2021   Procedure: PORTA CATH INSERTION;  Surgeon: Renford Dills, MD;  Location: [facility] INVASIVE CV LAB;  Service: Cardiovascular;  Laterality: N/A;   VIDEO BRONCHOSCOPY WITH ENDOBRONCHIAL NAVIGATION N/A 03/15/2021   Procedure: VIDEO BRONCHOSCOPY WITH ENDOBRONCHIAL NAVIGATION;  Surgeon: Vida Rigger, MD;  Location: [facility] ORS;  Service: Thoracic;  Laterality: N/A;   VIDEO BRONCHOSCOPY WITH ENDOBRONCHIAL ULTRASOUND N/A 03/15/2021   Procedure: VIDEO BRONCHOSCOPY WITH ENDOBRONCHIAL ULTRASOUND;  Surgeon: Vida Rigger, MD;  Location: [facility] ORS;  Service: Thoracic;  Laterality: N/A;    Medications Prior to  Admission  Medication Sig Dispense Refill Last Dose   albuterol (VENTOLIN HFA) 108 (90 Base) MCG/ACT inhaler Inhale 2 puffs into the lungs every 6 (six) hours as needed for wheezing or shortness of breath. 8 g 2 prn at unknown   calcium carbonate (TUMS EX) 750 MG chewable tablet Chew 1 tablet by mouth daily.   prn at unknown   folic acid (FOLVITE) 1 MG tablet TAKE 1 TABLET BY MOUTH EVERY DAY 90 tablet 1 09/20/2023   lidocaine-prilocaine (EMLA) cream  Apply 1 Application topically as needed. 30 g 0 prn at unknown   methylphenidate (RITALIN) 5 MG tablet Take 1 tablet (5 mg total) by mouth 2 (two) times daily as needed (fatigue). 60 tablet 0 prn at unknown   metoCLOPramide (REGLAN) 10 MG tablet Take 1 tablet (10 mg total) by mouth every 8 (eight) hours as needed for nausea. 30 tablet 1 09/20/2023   montelukast (SINGULAIR) 10 MG tablet TAKE 1 TABLET BY MOUTH EVERYDAY AT BEDTIME 90 tablet 1 prn at unknown   OLANZapine (ZYPREXA) 5 MG tablet Take 1 tablet (5 mg total) by mouth at bedtime as needed (nausea). 30 tablet 0 09/20/2023   ondansetron (ZOFRAN-ODT) 4 MG disintegrating tablet Take 1 tablet (4 mg total) by mouth every 8 (eight) hours as needed for nausea or vomiting. 20 tablet 1 prn at unknown   prochlorperazine (COMPAZINE) 10 MG tablet Take 1 tablet (10 mg total) by mouth every 6 (six) hours as needed for nausea or vomiting. 30 tablet 0 09/21/2023   promethazine (PHENERGAN) 25 MG tablet Take 1 tablet (25 mg total) by mouth every 6 (six) hours as needed for nausea or vomiting. 30 tablet 0 prn at unknown   sertraline (ZOLOFT) 50 MG tablet Take 1 tablet (50 mg total) by mouth daily. 30 tablet 1 09/20/2023   sodium chloride 1 g tablet Take 1 tablet (1 g total) by mouth 3 (three) times daily. For low sodium levels 120 tablet 0 09/20/2023   triamcinolone ointment (KENALOG) 0.5 % Apply 1 Application topically 2 (two) times daily. 30 g 1 prn at unknown   ELIQUIS 2.5 MG TABS tablet TAKE 1 TABLET BY MOUTH TWICE A DAY (Patient not taking: Reported on 09/21/2023) 60 tablet 4 Not Taking   metoprolol succinate (TOPROL-XL) 50 MG 24 hr tablet Take 50 mg by mouth daily. Take with or immediately following a meal. (Patient not taking: Reported on 09/21/2023)   Not Taking   Social History   Socioeconomic History   Marital status: Married    Spouse name: Darl Pikes   Number of children: 2   Years of education: Not on file   Highest education level: Not on file   Occupational History   Not on file  Tobacco Use   Smoking status: Never   Smokeless tobacco: Former    Types: Snuff, Chew  Vaping Use   Vaping status: Never Used  Substance and Sexual Activity   Alcohol use: Never   Drug use: Never   Sexual activity: Yes  Other Topics Concern   Not on file  Social History Narrative   [place] in [place]; with wife; 2 daughters[12 and 22]; never smoked; rare alcohol. 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 Dog and cat.   Social Determinants of Health   Financial Resource Strain: Low Risk  (05/10/2022)   Overall Financial Resource Strain (CARDIA)    Difficulty of Paying Living Expenses: Not hard at all  Food Insecurity: No Food Insecurity (09/23/2023)   Hunger Vital Sign  Worried About Programme researcher, broadcasting/film/video in the Last Year: Never true    Ran Out of Food in the Last Year: Never true  Transportation Needs: No Transportation Needs (09/23/2023)   PRAPARE - Administrator, Civil Service (Medical): No    Lack of Transportation (Non-Medical): No  Physical Activity: Not on file  Stress: Not on file  Social Connections: Socially Integrated (05/10/2022)   Social Connection and Isolation Panel [NHANES]    Frequency of Communication with Friends and Family: More than three times a week    Frequency of Social Gatherings with Friends and Family: More than three times a week    Attends Religious Services: More than 4 times per year    Active Member of Clubs or Organizations: Yes    Attends Banker Meetings: More than 4 times per year    Marital Status: Married  Catering manager Violence: Unknown (09/23/2023)   Humiliation, Afraid, Rape, and Kick questionnaire    Fear of Current or Ex-Partner: Not on file    Emotionally Abused: No    Physically Abused: No    Sexually Abused: No    Family History  Problem Relation Age of Onset   High Cholesterol Mother    Hypertension Father    Lung cancer Father    Skin cancer Father      Vitals:   09/24/23 2049  09/25/23 0002 09/25/23 0404 09/25/23 0808  BP: 119/82 (!) 112/90 114/86 107/89  Pulse: (!) 109 (!) 101  (!) 102  Resp: 18 20 20    Temp: 97.8 F (36.6 C) 98 F (36.7 C) 98.7 F (37.1 C)   TempSrc: Oral Oral Axillary   SpO2: 99% 100% 95% 100%  Weight:      Height:        PHYSICAL EXAM General: Ill appearing male, well nourished, in no acute distress resting comfortably in hospital bed with wife and mother present at bedside. HEENT: Normocephalic and atraumatic. Neck: No JVD.  Lungs: Normal respiratory effort on 3L [place]. Clear bilaterally to auscultation. Rhonchi noted bilaterally.  Heart: Regular rhythm, fast rate. Normal S1 and S2 without gallops or murmurs.  Abdomen: Non-distended appearing.  Msk: Normal strength and tone for age. Extremities: Warm and well perfused. No clubbing, cyanosis.  Trace edema.  Neuro: Somnolent.  Labs: Basic Metabolic Panel: Recent Labs    09/24/23 1103 09/24/23 1853 09/25/23 0411  NA 133* 134* 134*  K 3.5 3.1* 3.7  CL 100 104 103  CO2 25 24 25   GLUCOSE 147* 170* 147*  BUN 14 12 12   CREATININE 0.74 0.71 0.74  CALCIUM 8.2* 7.7* 8.3*  MG 2.0  --  1.7  PHOS 2.5  --  2.8   Liver Function Tests: Recent Labs    09/24/23 1103 09/25/23 0411  AST 52* 52*  ALT 56* 54*  ALKPHOS 198* 187*  BILITOT 1.0 1.1  PROT 4.9* 4.3*  ALBUMIN 3.0* 2.8*   No results for input(s): "LIPASE", "AMYLASE" in the last 72 hours. CBC: Recent Labs    09/24/23 1103 09/25/23 0415  WBC 15.1* 14.5*  HGB 8.4* 8.3*  HCT 23.7* 23.7*  MCV 92.9 92.9  PLT 87* 80*   Cardiac Enzymes: No results for input(s): "CKTOTAL", "CKMB", "CKMBINDEX", "TROPONINIHS" in the last 72 hours. BNP: No results for input(s): "BNP" in the last 72 hours. D-Dimer: No results for input(s): "DDIMER" in the last 72 hours. Hemoglobin A1C: No results for input(s): "HGBA1C" in the last 72 hours. Fasting Lipid  Panel: No results for input(s): "CHOL", "HDL", "LDLCALC", "TRIG", "CHOLHDL",  "LDLDIRECT" in the last 72 hours. Thyroid Function Tests: No results for input(s): "TSH", "T4TOTAL", "T3FREE", "THYROIDAB" in the last 72 hours.  Invalid input(s): "FREET3" Anemia Panel: No results for input(s): "VITAMINB12", "FOLATE", "FERRITIN", "TIBC", "IRON", "RETICCTPCT" in the last 72 hours.   Radiology: CT CHEST ABDOMEN PELVIS W CONTRAST  Result Date: 09/24/2023 CLINICAL DATA:  Follow-up non-small cell lung cancer EXAM: CT CHEST, ABDOMEN, AND PELVIS WITH CONTRAST TECHNIQUE: Multidetector CT imaging of the chest, abdomen and pelvis was performed following the standard protocol during bolus administration of intravenous contrast. RADIATION DOSE REDUCTION: This exam was performed according to the departmental dose-optimization program which includes automated exposure control, adjustment of the mA and/or kV according to patient size and/or use of iterative reconstruction technique. CONTRAST:  OMNIPAQUE IOHEXOL 300 MG/ML  SOLN COMPARISON:  CT abdomen/pelvis dated 08/13/2023. PET-CT dated 07/16/2023. FINDINGS: CT CHEST FINDINGS Cardiovascular: Heart is normal in size.  No pericardial effusion. No evidence of thoracic aortic aneurysm. Right chest port terminates at the cavoatrial junction. Mediastinum/Nodes: No suspicious mediastinal lymphadenopathy. Visualized thyroid is unremarkable. Lungs/Pleura: Moderate left pleural effusion with chronic pleural thickening. Mild complexity inferiorly (series 2/image 46). However, the effusion was non FDG avid on prior PET. Scarring/atelectasis in the lingula and left lower lobe. New patchy/ground-glass opacities in the right upper lobe, raising concern for atypical infection/pneumonia. Mild interlobular septal thickening in the right middle and lower lobes, nonspecific. No pneumothorax. Musculoskeletal: Lytic metastasis in the posterior T3 vertebral body (series 6/image 75). CT ABDOMEN PELVIS FINDINGS Hepatobiliary: Lobular hepatic contour. Atrophy/scarring of  the left hepatic lobe. Associated low-density in the left hepatic lobe suggests ill-defined metastatic disease (series 2/image 49), although difficult to measure. Additional 4.0 cm low-density lesion in segment 5 (series 2/image 57), similar to prior CT, although with only mild hypermetabolism on prior PET. Regardless, residual hepatic metastases are favored. Gallbladder is unremarkable. No intrahepatic or extrahepatic duct dilatation. Pancreas: Within normal limits. Spleen: Within normal limits. Adrenals/Urinary Tract: Adrenal glands are within normal limits. Kidneys are within normal limits.  No hydronephrosis. Bladder is within normal limits. Stomach/Bowel: Stomach is within normal limits. No evidence of bowel obstruction. Normal appendix (series 2/image 90). No colonic wall thickening or inflammatory changes. Vascular/Lymphatic: No evidence of abdominal aortic aneurysm. No suspicious abdominopelvic lymphadenopathy. Reproductive: Prostate is unremarkable. Other: Moderate abdominal ascites, partially loculated. This was non FDG avid on prior PET. Moderate body wall edema, new. Musculoskeletal: No focal osseous lesions. IMPRESSION: New patchy/ground-glass opacities in the right upper lobe, raising concern for atypical infection/pneumonia. Moderate left pleural effusion with chronic pleural thickening, unchanged. Suspected hepatic metastases, ill-defined/poorly evaluated, with superimposed left hepatic lobe scarring/atrophy. This appearance is similar to the prior. Stable lytic metastasis in the posterior T3 vertebral body. Moderate abdominal ascites, unchanged. Moderate body wall edema, new. Electronically Signed   By: Charline Bills M.D.   On: 09/24/2023 20:03   US Paracentesis  Result Date: 09/24/2023 INDICATION: History of metastatic lung cancer with recurrent malignant ascites. Request is for therapeutic paracentesis EXAM: ULTRASOUND GUIDED THERAPEUTIC PARACENTESIS MEDICATIONS: Lidocaine 1% 10 mL  COMPLICATIONS: None immediate. PROCEDURE: Informed written consent was obtained from the patient after a discussion of the risks, benefits and alternatives to treatment. A timeout was performed prior to the initiation of the procedure. Initial ultrasound scanning demonstrates a small amount of ascites within the right lower abdominal quadrant. The right lower abdomen was prepped and draped in the usual sterile fashion. 1% lidocaine was  used for local anesthesia. Following this, a 19 gauge, 7-cm, Yueh catheter was introduced. An ultrasound image was saved for documentation purposes. The paracentesis was performed. The catheter was removed and a dressing was applied. The patient tolerated the procedure well without immediate post procedural complication. Patient received post-procedure intravenous albumin; see nursing notes for details. FINDINGS: A total of approximately 2.7 L of straw-colored fluid was removed. Samples were sent to the laboratory as requested by the clinical team. IMPRESSION: Successful ultrasound-guided therapeutic paracentesis yielding 2.7 liters of peritoneal fluid. Performed by Anders Grant NP Electronically Signed   By: Judie Petit.  Shick M.D.   On: 09/24/2023 16:04   US THORACENTESIS ASP PLEURAL SPACE W/IMG GUIDE  Result Date: 09/23/2023 INDICATION: Patient with a history of lung cancer with recurrent pleural effusions. Interventional radiology asked to perform a therapeutic thoracentesis. EXAM: ULTRASOUND GUIDED THORACENTESIS MEDICATIONS: 1% lidocaine 15 mL COMPLICATIONS: None immediate. PROCEDURE: An ultrasound guided thoracentesis was thoroughly discussed with the patient and questions answered. The benefits, risks, alternatives and complications were also discussed. The patient understands and wishes to proceed with the procedure. Written consent was obtained. Ultrasound was performed to localize and mark an adequate pocket of fluid in the left chest. The area was then prepped and draped  in the normal sterile fashion. 1% Lidocaine was used for local anesthesia. Under ultrasound guidance a 6 Fr Safe-T-Centesis catheter was introduced. Thoracentesis was performed. The catheter was removed and a dressing applied. FINDINGS: A total of approximately 450 mL of dark red fluid was removed. IMPRESSION: Successful ultrasound guided left thoracentesis yielding 450 mL of pleural fluid. Procedure performed by Alwyn Ren NP Electronically Signed   By: Judie Petit.  Shick M.D.   On: 09/23/2023 11:02   DG Chest Port 1 View  Result Date: 09/23/2023 CLINICAL DATA:  Status post left thoracentesis. EXAM: PORTABLE CHEST 1 VIEW COMPARISON:  September 21, 2023. FINDINGS: No pneumothorax is noted status post left thoracentesis. Left pleural effusion is slightly decreased compared to prior exam. IMPRESSION: No pneumothorax status post left thoracentesis. Electronically Signed   By: Lupita Raider M.D.   On: 09/23/2023 10:54   DG Chest 2 View  Result Date: 09/21/2023 CLINICAL DATA:  Suspected sepsis. Lung and liver cancer per patient. Vomiting. EXAM: CHEST - 2 VIEW COMPARISON:  Chest radiographs 09/09/2023 and 09/06/2023. Chest CT 07/16/2022. FINDINGS: Right IJ Port-A-Cath extends to the level of the superior cavoatrial junction, stable. The visualized heart size and mediastinal contours are stable. A large laterally loculated left pleural effusion appears slightly increased compared with the most recent prior study. Associated chronic compressive left lung atelectasis. No evidence of right-sided pleural effusion or pneumothorax. Mild right basilar atelectasis, unchanged. The bones appear unchanged. IMPRESSION: Slight increase in size of a large laterally loculated left pleural effusion with associated chronic compressive left lung atelectasis. No other significant changes. Electronically Signed   By: Carey Bullocks M.D.   On: 09/21/2023 14:19   US Paracentesis  Result Date: 09/17/2023 INDICATION: Metastatic lung  cancer and malignant ascites. Request for therapeutic paracentesis. EXAM: ULTRASOUND GUIDED PARACENTESIS MEDICATIONS: 1% lidocaine 10 mL COMPLICATIONS: None immediate. PROCEDURE: Informed written consent was obtained from the patient after a discussion of the risks, benefits and alternatives to treatment. A timeout was performed prior to the initiation of the procedure. Initial ultrasound scanning demonstrates a large amount of ascites within the right lateral abdomen. The right lateral abdomen was prepped and draped in the usual sterile fashion. 1% lidocaine was used for local anesthesia. Following this,  a 19 gauge, 7-cm, Yueh catheter was introduced. An ultrasound image was saved for documentation purposes. The paracentesis was performed. The catheter was removed and a dressing was applied. The patient tolerated the procedure well without immediate post procedural complication. FINDINGS: A total of approximately 5.6 L of clear yellow fluid was removed. IMPRESSION: Successful ultrasound-guided paracentesis yielding 5.6 liters of peritoneal fluid. Electronically Signed   By: Gilmer Mor D.O.   On: 09/17/2023 12:03   DG Chest Port 1 View  Result Date: 09/09/2023 CLINICAL DATA:  Left lower lobe lung cancer. Status post left thoracentesis. EXAM: PORTABLE CHEST 1 VIEW COMPARISON:  09/06/2023 FINDINGS: Power injectable Port-A-Cath tip: Cavoatrial junction. Minimal increase in aeration in the left lung, large left pleural effusion observed. No pneumothorax or radiographic evidence of procedure complication. The right lung remains clear. Heart size within normal limits although the left apical border remains obscured. IMPRESSION: 1. Minimal increase in aeration in the left lung, large left pleural effusion observed. No pneumothorax or radiographic evidence of procedure complication. Electronically Signed   By: Gaylyn Rong M.D.   On: 09/09/2023 14:38   US THORACENTESIS ASP PLEURAL SPACE W/IMG GUIDE  Result  Date: 09/09/2023 INDICATION: Patient with history of left lung cancer with recurrent left-sided pleural effusion. Request is for therapeutic thoracentesis EXAM: ULTRASOUND GUIDED THERAPEUTIC LEFT-SIDED THORACENTESIS MEDICATIONS: Lidocaine 1% 10 mL COMPLICATIONS: None immediate. PROCEDURE: An ultrasound guided thoracentesis was thoroughly discussed with the patient and questions answered. The benefits, risks, alternatives and complications were also discussed. The patient understands and wishes to proceed with the procedure. Written consent was obtained. Ultrasound was performed to localize and mark an adequate pocket of fluid in the left chest. The area was then prepped and draped in the normal sterile fashion. 1% Lidocaine was used for local anesthesia. Under ultrasound guidance a 6 Fr Safe-T-Centesis catheter was introduced. Thoracentesis was performed. The catheter was removed and a dressing applied. FINDINGS: A total of approximately 450 mL of reddish brown fluid was removed. Samples were sent to the laboratory as requested by the clinical team. IMPRESSION: Successful ultrasound guided therapeutic left-sided thoracentesis yielding 450 mL of pleural fluid. Performed by Anders Grant NP Electronically Signed   By: Judie Petit.  Shick M.D.   On: 09/09/2023 13:58   DG Chest 2 View  Result Date: 09/06/2023 CLINICAL DATA:  Cough and shortness of breath. History of lung cancer. EXAM: CHEST - 2 VIEW COMPARISON:  Chest x-ray dated August 13, 2023. FINDINGS: Unchanged right chest wall port catheter. Stable cardiomediastinal silhouette with normal heart size. Unchanged chronic loculated large left pleural effusion with atelectasis in the lingula and left lower lobe. Slightly increased interstitial thickening in the right lung. No pneumothorax. No acute osseous abnormality. IMPRESSION: 1. Slightly increased interstitial thickening in the right lung, which may reflect atypical infection or pulmonary edema. 2. Unchanged  chronic loculated large left pleural effusion. Electronically Signed   By: Obie Dredge M.D.   On: 09/06/2023 17:11   US Paracentesis  Result Date: 09/06/2023 INDICATION: malignant ascites EXAM: ULTRASOUND GUIDED  PARACENTESIS MEDICATIONS: None. COMPLICATIONS: None immediate. PROCEDURE: Informed written consent was obtained from the patient after a discussion of the risks, benefits and alternatives to treatment. A timeout was performed prior to the initiation of the procedure. Initial ultrasound scanning demonstrates a large amount of ascites within the right lower abdominal quadrant. The right lower abdomen was prepped and draped in the usual sterile fashion. 1% lidocaine was used for local anesthesia. Following this, a 19 gauge, 7-cm, Griffith Citron  catheter was introduced. An ultrasound image was saved for documentation purposes. The paracentesis was performed. The catheter was removed and a dressing was applied. The patient tolerated the procedure well without immediate post procedural complication. FINDINGS: A total of approximately 4.8 L of clear, straw-colored fluid was removed. IMPRESSION: Successful ultrasound-guided paracentesis yielding 4.8 liters of peritoneal fluid. Electronically Signed   By: Olive Bass M.D.   On: 09/06/2023 16:39    ECHO ordered  TELEMETRY reviewed by me 09/25/2023: Sinus tachycardia rate 100s  EKG reviewed by me: supraventricular tachycardia rate 172 bpm  Data reviewed by me 09/25/2023: last 24h vitals tele labs imaging I/O ED provider note, admission H&P, hospitalist progress notes, oncolopy notes, nephrology notes  Principal Problem:   Syncope Active Problems:   WPW (Wolff-Parkinson-White syndrome)   Hyponatremia   Loculated pleural effusion   Goals of care, counseling/discussion   Intractable nausea and vomiting   Adenocarcinoma of lung, stage 4, left (HCC)   Lactic acidosis   AKI (acute kidney injury) (HCC)   Ascites   Syncope and collapse    ASSESSMENT  AND PLAN:  [name] is a 42 y.o. male  with a past medical history of stage IV lung adenocarcinoma with brain, liver, bone metastasis on Carboplatin-Alimta-Amivatimab, associated thrombocytopenia, Wolff-Parkinson-White syndrome on metoprolol who presented to the ED on 09/21/2023 for syncope. Cardiology was consulted for further evaluation.   # Wolff-Parkinson-White Syndrome # Supraventricular tachycardia Patient with long-standing hx of WPW and prior episodes of SVT admitted for syncope 2/2 volume depletion and hyponatremia. Had an episode of SVT early morning on 12/10 which required one dose of adenosine for conversion to NSR. Has maintained in NSR since this time.  -Increase metoprolol to 25 mg twice daily.  -Echo ordered for additional evaluation.  -Continue to monitor for recurrence of SVT.  -Monitor and replenish electrolytes for a goal K >4, Mag >2    # Syncope # Intractable nausea and vomiting # Electrolyte derangements Patient initially presented after syncopal episode, recent intractable nausea/vomiting with intravascular volume depletion and multiple electrolyte derangements.  -Management per primary.  # Stage IV adenocarcinoma of the lung # Recurrent malignant L pleural effusion -Management per primary, oncology.    This patient's plan of care was discussed and created with Dr. Juliann Pares and he is in agreement.  Signed: Gale Journey, PA-C  09/25/2023, 8:51 AM [facility]

## 2023-09-25 NOTE — Evaluation (Signed)
Occupational Therapy Evaluation Patient Details Name: [name] MRN: [id number] DOB: 26-Oct-1980 Today's Date: 09/25/2023   History of Present Illness Pt is a 42 yo M who presented to the ED due to syncope. 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   Clinical Impression   Chart reviewed, pt greeted in chair, alert and oriented x4, agreeable to OT evaluation. PTA pt is generally MOD I with ADL except for LB ADLs, amb with no AD limited community distances. Pt presents with deficits in activity tolerance and strength on this date. Pt provided education re: LB dressing/bathing with compensatory techniques/use of DME as needed with pt able to perform LB dressing (underwear, shoes) with supervision, intermittent vcs. Pt will benefit from acute OT to address function deficits and to optimize ADL performance as pt can tolerate/wants to. OT will follow acutely.      If plan is discharge home, recommend the following: A little help with walking and/or transfers;A little help with bathing/dressing/bathroom    Functional Status Assessment  Patient has had a recent decline in their functional status and demonstrates the ability to make significant improvements in function in a reasonable and predictable amount of time.  Equipment Recommendations  Other (comment) (reacher/sock aid; pt can purchase privately)    Recommendations for Other Services       Precautions / Restrictions Precautions Precautions: Fall Restrictions Weight Bearing Restrictions: No      Mobility Bed Mobility               General bed mobility comments: NT in recliner pre/post session    Transfers Overall transfer level:  Needs assistance Equipment used: None Transfers: Sit to/from Stand Sit to Stand: Contact guard assist                  Balance Overall balance assessment: Needs assistance Sitting-balance support: Feet supported, No upper extremity supported Sitting balance-Leahy Scale: Good     Standing balance support: During functional activity, Bilateral upper extremity supported, No upper extremity supported Standing balance-Leahy Scale: Fair                             ADL either performed or assessed with clinical judgement   ADL Overall ADL's : Needs assistance/impaired     Grooming: Wash/dry hands;Wash/dry face;Sitting;Set up               Lower Body Dressing: Supervision/safety Lower Body Dressing Details (indicate cue type and reason): with vcs for compesnatory strategies/AE PRN; anticipate MOD I in future tx sessions Toilet Transfer: Contact guard assist;Ambulation (simulated, no AD)   Toileting- Clothing Manipulation and Hygiene: Supervision/safety;Sitting/lateral lean       Functional mobility during ADLs: Supervision/safety;Contact guard assist (approx 160' with no AD)       Vision Patient Visual Report: No change from baseline       Perception         Praxis         Pertinent Vitals/Pain Pain Assessment Pain Assessment: No/denies pain     Extremity/Trunk Assessment Upper Extremity Assessment Upper Extremity Assessment: Overall WFL for tasks assessed   Lower Extremity Assessment Lower Extremity Assessment: Generalized weakness       Communication Communication Communication: No apparent difficulties Cueing Techniques: Verbal cues   Cognition  Arousal: Alert Behavior During Therapy: WFL for tasks assessed/performed Overall Cognitive Status: Within Functional Limits for tasks assessed                                       General Comments  spo2 93% on RA, HR in the 80s bpm post mobility    Exercises Other  Exercises Other Exercises: edu re: role of OT, role of rehab, discharge recommendations, ADL completion with DME/AE/compensatory techniques   Shoulder Instructions      Home Living Family/patient expects to be discharged to:: Private residence Living Arrangements: Spouse/significant other;Children Available Help at Discharge: Family;Available 24 hours/day Type of Home: House Home Access: Stairs to enter [employer] of Steps: 2.5 at garage Entrance Stairs-Rails: Left Home Layout: One level     Bathroom Shower/Tub: Producer, television/film/video: Handicapped height Bathroom Accessibility: Yes   Home Equipment: Shower seat;Grab bars - tub/shower;Grab bars - toilet          Prior Functioning/Environment Prior Level of Function : Needs assist             Mobility Comments: amb limited community distances with no AD ADLs Comments: generally MOD I with feeding/grooming, increased assist with LB bathing/dressing; assist for IADLs        OT Problem List: Decreased activity tolerance;Decreased knowledge of use of DME or AE      OT Treatment/Interventions: Self-care/ADL training;Therapeutic exercise;Balance training;Therapeutic activities;DME and/or AE instruction    OT Goals(Current goals can be found in the care plan section) Acute Rehab OT Goals Patient Stated Goal: complete ADLs OT Goal Formulation: With patient Time For Goal Achievement: 10/09/23 Potential to Achieve Goals: Good ADL Goals Pt Will Perform Grooming: with modified independence;sitting Pt Will Perform Lower Body Dressing: with modified independence;sitting/lateral leans Pt Will Transfer to Toilet: with modified independence;ambulating Pt Will Perform Toileting - Clothing Manipulation and hygiene: with modified independence;sit to/from stand  OT Frequency: Min 1X/week    Co-evaluation              AM-PAC OT "6 Clicks" Daily Activity     Outcome Measure Help from another person  eating meals?: None Help from another person taking care of personal grooming?: None Help from another person toileting, which includes using toliet, bedpan, or urinal?: A Little Help from another person bathing (including washing, rinsing, drying)?: A Little Help from another person to put on and taking off regular upper body clothing?: None Help from another person to put on and taking off regular lower body clothing?: A Little 6 Click Score: 21   End of Session Nurse Communication: Mobility status  Activity Tolerance: Patient tolerated treatment well Patient left: Other (comment) (at edge of bed with respiratory present)  OT Visit Diagnosis: Other abnormalities of gait and mobility (R26.89)                Time: 9629-5284 OT Time Calculation (min): 17 min Charges:  OT General Charges $OT Visit: 1 Visit OT Evaluation $OT Eval Low Complexity: 1 Low Oleta Mouse, OTD OTR/L  09/25/23, 3:29 PM

## 2023-09-26 ENCOUNTER — Other Ambulatory Visit: Payer: Self-pay | Admitting: Student

## 2023-09-26 ENCOUNTER — Inpatient Hospital Stay
Admit: 2023-09-26 | Discharge: 2023-09-26 | Disposition: A | Discharge status: Home / Self Care | Payer: 59 | Attending: Student | Admitting: Student

## 2023-09-26 ENCOUNTER — Encounter: Payer: Self-pay | Admitting: Nurse Practitioner

## 2023-09-26 ENCOUNTER — Encounter: Payer: Self-pay | Admitting: Internal Medicine

## 2023-09-26 DIAGNOSIS — R5381 Other malaise: Secondary | ICD-10-CM

## 2023-09-26 DIAGNOSIS — R55 Syncope and collapse: Secondary | ICD-10-CM | POA: Diagnosis not present

## 2023-09-26 LAB — ECHOCARDIOGRAM COMPLETE
AR max vel: 3.43 cm2
AV Area VTI: 3.7 cm2
AV Area mean vel: 3.3 cm2
AV Mean grad: 2 mm[Hg]
AV Peak grad: 2.5 mm[Hg]
Ao pk vel: 0.8 m/s
Area-P 1/2: 7.02 cm2
Height: 70 in
S' Lateral: 3.2 cm
Weight: 3040 [oz_av]

## 2023-09-26 LAB — PROTIME-INR
INR: 2 — ABNORMAL HIGH (ref 0.8–1.2)
Prothrombin Time: 22.8 s — ABNORMAL HIGH (ref 11.4–15.2)

## 2023-09-26 LAB — BASIC METABOLIC PANEL
Anion gap: 3 — ABNORMAL LOW (ref 5–15)
Anion gap: 5 (ref 5–15)
Anion gap: 6 (ref 5–15)
BUN: 10 mg/dL (ref 6–20)
BUN: 10 mg/dL (ref 6–20)
BUN: 11 mg/dL (ref 6–20)
CO2: 26 mmol/L (ref 22–32)
CO2: 27 mmol/L (ref 22–32)
CO2: 28 mmol/L (ref 22–32)
Calcium: 7.8 mg/dL — ABNORMAL LOW (ref 8.9–10.3)
Calcium: 7.8 mg/dL — ABNORMAL LOW (ref 8.9–10.3)
Calcium: 7.9 mg/dL — ABNORMAL LOW (ref 8.9–10.3)
Chloride: 100 mmol/L (ref 98–111)
Chloride: 101 mmol/L (ref 98–111)
Chloride: 102 mmol/L (ref 98–111)
Creatinine, Ser: 0.65 mg/dL (ref 0.61–1.24)
Creatinine, Ser: 0.68 mg/dL (ref 0.61–1.24)
Creatinine, Ser: 0.84 mg/dL (ref 0.61–1.24)
GFR, Estimated: >60 mL/min (ref >60)
GFR, Estimated: >60 mL/min (ref >60)
GFR, Estimated: >60 mL/min (ref >60)
Glucose, Bld: 116 mg/dL — ABNORMAL HIGH (ref 70–99)
Glucose, Bld: 126 mg/dL — ABNORMAL HIGH (ref 70–99)
Glucose, Bld: 91 mg/dL (ref 70–99)
Potassium: 3.6 mmol/L (ref 3.5–5.1)
Potassium: 3.9 mmol/L (ref 3.5–5.1)
Potassium: 4.1 mmol/L (ref 3.5–5.1)
Sodium: 132 mmol/L — ABNORMAL LOW (ref 135–145)
Sodium: 133 mmol/L — ABNORMAL LOW (ref 135–145)
Sodium: 133 mmol/L — ABNORMAL LOW (ref 135–145)

## 2023-09-26 LAB — MAGNESIUM: Magnesium: 1.6 mg/dL — ABNORMAL LOW (ref 1.7–2.4)

## 2023-09-26 LAB — CBC
HCT: 26 % — ABNORMAL LOW (ref 39.0–52.0)
Hemoglobin: 8.9 g/dL — ABNORMAL LOW (ref 13.0–17.0)
MCH: 33.2 pg (ref 26.0–34.0)
MCHC: 34.2 g/dL (ref 30.0–36.0)
MCV: 97 fL (ref 80.0–100.0)
Platelets: 69 10*3/uL — ABNORMAL LOW (ref 150–400)
RBC: 2.68 MIL/uL — ABNORMAL LOW (ref 4.22–5.81)
RDW: 17.4 % — ABNORMAL HIGH (ref 11.5–15.5)
WBC: 13.8 10*3/uL — ABNORMAL HIGH (ref 4.0–10.5)
nRBC: 3.6 % — ABNORMAL HIGH (ref 0.0–0.2)

## 2023-09-26 LAB — HEPATIC FUNCTION PANEL
ALT: 74 U/L — ABNORMAL HIGH (ref 0–44)
AST: 70 U/L — ABNORMAL HIGH (ref 15–41)
Albumin: 2.4 g/dL — ABNORMAL LOW (ref 3.5–5.0)
Alkaline Phosphatase: 203 U/L — ABNORMAL HIGH (ref 38–126)
Bilirubin, Direct: 0.5 mg/dL — ABNORMAL HIGH (ref 0.0–0.2)
Indirect Bilirubin: 0.6 mg/dL (ref 0.3–0.9)
Total Bilirubin: 1.1 mg/dL (ref <1.2)
Total Protein: 4.3 g/dL — ABNORMAL LOW (ref 6.5–8.1)

## 2023-09-26 LAB — CULTURE, BLOOD (ROUTINE X 2)
Culture: NO GROWTH
Culture: NO GROWTH
Special Requests: ADEQUATE

## 2023-09-26 MED ORDER — LEVOFLOXACIN 750 MG PO TABS: 750.0000 mg | ORAL_TABLET | Freq: Every day | ORAL | 0 refills | Status: AC

## 2023-09-26 MED ORDER — PANTOPRAZOLE SODIUM 40 MG PO TBEC: 40.0000 mg | DELAYED_RELEASE_TABLET | Freq: Every day | ORAL | 3 refills | Status: DC

## 2023-09-26 MED ORDER — METOPROLOL TARTRATE 25 MG PO TABS: 25.0000 mg | ORAL_TABLET | Freq: Two times a day (BID) | ORAL | 11 refills | Status: DC

## 2023-09-26 MED ORDER — LACTULOSE 10 GM/15ML PO SOLN: 20.0000 g | Freq: Two times a day (BID) | ORAL | 0 refills | Status: DC | PRN

## 2023-09-26 MED ORDER — FUROSEMIDE 20 MG PO TABS
20.0000 mg | ORAL_TABLET | Freq: Two times a day (BID) | ORAL | Status: DC
  Administered 2023-09-26: 20 mg via ORAL
  Filled 2023-09-26: qty 1

## 2023-09-26 MED ORDER — FUROSEMIDE 20 MG PO TABS: 40.0000 mg | ORAL_TABLET | Freq: Every day | ORAL | 5 refills | Status: DC

## 2023-09-26 MED ORDER — TRAZODONE HCL 50 MG PO TABS: 25.0000 mg | ORAL_TABLET | Freq: Every evening | ORAL | 0 refills | Status: AC | PRN

## 2023-09-26 MED ORDER — BISACODYL 5 MG PO TBEC: 10.0000 mg | DELAYED_RELEASE_TABLET | Freq: Every evening | ORAL | 0 refills | Status: DC | PRN

## 2023-09-26 MED FILL — Fosaprepitant Dimeglumine For IV Infusion 150 MG (Base Eq): INTRAVENOUS | Qty: 5 | Status: AC

## 2023-09-26 NOTE — Plan of Care (Signed)
Progressing towards goals

## 2023-09-26 NOTE — Consult Note (Signed)
PHARMACY CONSULT NOTE - Tolvaptan & Electrolytes  Pharmacy Consult for Tolvaptan Monitoring  Recent Labs: Potassium (mmol/L)  Date Value  09/25/2023 3.9   Magnesium (mg/dL)  Date Value  64/40/3474 1.7   Calcium (mg/dL)  Date Value  25/95/6387 7.9 (L)   Albumin (g/dL)  Date Value  56/43/3295 2.4 (L)   Phosphorus (mg/dL)  Date Value  18/84/1660 2.8   Sodium (mmol/L)  Date Value  09/25/2023 133 (L)    Assessment  [name] is a 42 y.o. male presenting with syncope. PMH significant for stage IV adenocarcinoma with brain, liver, bone metastasis, associated thrombocytopenia, Wolff-Parkinson-White syndrome.  Patient found to have serum Na of 121 on admission that worsened to 119. Pharmacy has been consulted to monitor tolvaptan as well as replace electrolytes.  Pertinent medications: N/A Drug interactions: N/A  Date Time Result/comment 12/9 0500  Na 121 12/9  1143  Tolvaptan 15 mg x1  12/9 1342  Na 121 12/9  1356  Na 121 12/9  1730  Na 120 12/10  0250  Na 127 12/10  1056  Tolvaptan 15 mg x 1 12/10  1103  Na 133 12/10  1853  Na 134 12/11  0411 Na 134 12/12 0000 Na 133  Goal of Therapy:  Na 132-133,Do not exceed increase in Na by 8 mEq/L per 8 hours or 12 mEq/L in 24 hours All other electrolytes WNL  Plan:  Na = 133, stable and at desired goal per nephrology recommendations.  Will continue to monitor Na every 24hrs. K 3.9 No additional replacement needed at this time. Will order BMP with AM labs.  Yael Angerer Rodriguez-Guzman PharmD, BCPS 09/26/2023 7:25 AM

## 2023-09-26 NOTE — Progress Notes (Signed)
Confirmed with Annice Pih at [facility] that order for add on testing was received via fax.

## 2023-09-26 NOTE — Progress Notes (Signed)
[facility]  ROUNDING NOTE   Subjective:   Patient seen sitting up in chair, mother at bedside States he feels well and is ready for discharge Appears frustrated when reinforced education for need for fluid restriction.  Sodium 133   Objective:  Vital signs in last 24 hours:  Temp:  [97.7 F (36.5 C)] 97.7 F (36.5 C) (12/12 0400) Pulse Rate:  [90-106] 94 (12/12 1145) Resp:  [18] 18 (12/11 2009) BP: (98-121)/(63-81) 102/81 (12/12 1145) SpO2:  [100 %] 100 % (12/12 1145)  Weight change:  Filed Weights   09/21/23 1340  Weight: 86.2 kg    Intake/Output: I/O last 3 completed shifts: In: 790 [P.O.:240; IV Piggyback:550] Out: -    Intake/Output this shift:  No intake/output data recorded.  Physical Exam: General: NAD  Head: Normocephalic, atraumatic. Moist oral mucosal membranes  Eyes: Anicteric  Lungs:  Diminished,  room air  Heart: Regular rate and rhythm  Abdomen:  Soft, nontender  Extremities: Trace peripheral edema.  Neurologic: Somnolent, moving all four extremities  Skin: No lesions       Basic Metabolic Panel: Recent Labs  Lab 09/22/23 0511 09/22/23 1314 09/23/23 0500 09/23/23 1342 09/24/23 1103 09/24/23 1853 09/25/23 0411 09/25/23 1100 09/25/23 2333 09/26/23 0455 09/26/23 1246  NA 119*   < > 121*   < > 133*   < > 134* 136 133* 133* 132*  K 3.6   < > 2.9*   < > 3.5   < > 3.7 3.3* 3.9 4.1 3.6  CL 90*   < > 87*   < > 100   < > 103 108 100 102 101  CO2 21*   < > 21*   < > 25   < > 25 24 27 28 26   GLUCOSE 89   < > 123*   < > 147*   < > 147* 135* 116* 91 126*  BUN 10   < > 12   < > 14   < > 12 9 10 10 11   CREATININE 0.82   < > 0.78   < > 0.74   < > 0.74 0.57* 0.65 0.84 0.68  CALCIUM 7.4*   < > 7.5*   < > 8.2*   < > 8.3* 7.5* 7.9* 7.8* 7.8*  MG 1.3*  --  1.5*  --  2.0  --  1.7  --   --   --  1.6*  PHOS 3.3  --  3.9  --  2.5  --  2.8  --   --   --   --    < > = values in this interval not displayed.    Liver Function Tests: Recent  Labs  Lab 09/23/23 0500 09/23/23 1356 09/24/23 1103 09/25/23 0411 09/26/23 0455  AST 48* 51* 52* 52* 70*  ALT 63* 62* 56* 54* 74*  ALKPHOS 227* 227* 198* 187* 203*  BILITOT 1.5* 1.5* 1.0 1.1 1.1  PROT 5.0* 5.1* 4.9* 4.3* 4.3*  ALBUMIN 2.8* 3.0* 3.0* 2.8* 2.4*   No results for input(s): "LIPASE", "AMYLASE" in the last 168 hours. No results for input(s): "AMMONIA" in the last 168 hours.  CBC: Recent Labs  Lab 09/21/23 1407 09/22/23 0511 09/23/23 0500 09/24/23 1103 09/25/23 0415 09/26/23 0455  WBC 7.4 7.7 7.7 15.1* 14.5* 13.8*  NEUTROABS 4.7  --   --   --   --   --   HGB 12.2* 9.2* 8.9* 8.4* 8.3* 8.9*  HCT 34.8* 25.8* 24.3* 23.7*  23.7* 26.0*  MCV 92.8 89.9 90.0 92.9 92.9 97.0  PLT 59* 53* 61* 87* 80* 69*    Cardiac Enzymes: No results for input(s): "CKTOTAL", "CKMB", "CKMBINDEX", "TROPONINI" in the last 168 hours.  BNP: Invalid input(s): "POCBNP"  CBG: Recent Labs  Lab 09/25/23 2029  GLUCAP 111*    Microbiology: Results for orders placed or performed during the hospital encounter of 09/21/23  Culture, blood (Routine x 2)     Status: None   Collection Time: 09/21/23  2:19 PM   Specimen: BLOOD  Result Value Ref Range Status   Specimen Description BLOOD PORTA CATH  Final   Special Requests   Final    BOTTLES DRAWN AEROBIC AND ANAEROBIC Blood Culture results may not be optimal due to an inadequate volume of blood received in culture bottles   Culture   Final    NO GROWTH 5 DAYS Performed at [facility], [address] [place], [place] [address]    Report Status 09/26/2023 FINAL  Final  Culture, blood (Routine x 2)     Status: None   Collection Time: 09/21/23  2:19 PM   Specimen: BLOOD LEFT ARM  Result Value Ref Range Status   Specimen Description BLOOD LEFT ARM  Final   Special Requests   Final    BOTTLES DRAWN AEROBIC AND ANAEROBIC Blood Culture adequate volume   Culture   Final    NO GROWTH 5 DAYS Performed at [facility], [address], [place], [place] [address]    Report Status 09/26/2023 FINAL  Final    Coagulation Studies: Recent Labs    09/25/23 0411 09/26/23 0455  LABPROT 22.2* 22.8*  INR 1.9* 2.0*    Urinalysis: No results for input(s): "COLORURINE", "LABSPEC", "PHURINE", "GLUCOSEU", "HGBUR", "BILIRUBINUR", "KETONESUR", "PROTEINUR", "UROBILINOGEN", "NITRITE", "LEUKOCYTESUR" in the last 72 hours.  Invalid input(s): "APPERANCEUR"     Imaging: ECHOCARDIOGRAM COMPLETE Result Date: 09/26/2023    ECHOCARDIOGRAM REPORT   Patient Name:   [name] Date of Exam: 09/26/2023 Medical Rec #:  [id number]       Height:       70.0 in Accession #:    [id number]      Weight:       190.0 lb Date of Birth:  January 14, 1981       BSA:          2.042 m Patient Age:    42 years        BP:           121/69 mmHg Patient Gender: M               HR:           90 bpm. Exam Location:  [facility] Procedure: 2D Echo, Cardiac Doppler and Color Doppler Indications:     Syncope R55  History:         Patient has no prior history of Echocardiogram examinations.                  Arrythmias:WPW; Signs/Symptoms:Dyspnea.  Sonographer:     Cristela Blue Referring Phys:  [id number] CARALYN HUDSON Diagnosing Phys: Alwyn Pea MD  Sonographer Comments: Suboptimal apical window. IMPRESSIONS  1. Left ventricular ejection fraction, by estimation, is 55 to 60%. The left ventricle has normal function. The left ventricle has no regional wall motion abnormalities. Left ventricular diastolic parameters were normal.  2. Right ventricular systolic function is mildly reduced. The right ventricular size is moderately enlarged. Mildly  increased right ventricular wall thickness.  3. The mitral valve is normal in structure. Trivial mitral valve regurgitation.  4. The aortic valve is normal in structure. Aortic valve regurgitation is not visualized. FINDINGS  Left Ventricle: Left ventricular ejection fraction, by estimation, is 55 to 60%. The left ventricle has normal  function. The left ventricle has no regional wall motion abnormalities. The left ventricular internal cavity size was normal in size. There is  no left ventricular hypertrophy. Left ventricular diastolic parameters were normal. Right Ventricle: The right ventricular size is moderately enlarged. Mildly increased right ventricular wall thickness. Right ventricular systolic function is mildly reduced. Left Atrium: Left atrial size was normal in size. Right Atrium: Right atrial size was normal in size. Pericardium: There is no evidence of pericardial effusion. Mitral Valve: The mitral valve is normal in structure. Trivial mitral valve regurgitation. Tricuspid Valve: The tricuspid valve is normal in structure. Tricuspid valve regurgitation is trivial. Aortic Valve: The aortic valve is normal in structure. Aortic valve regurgitation is not visualized. Aortic valve mean gradient measures 2.0 mmHg. Aortic valve peak gradient measures 2.5 mmHg. Aortic valve area, by VTI measures 3.70 cm. Pulmonic Valve: The pulmonic valve was normal in structure. Pulmonic valve regurgitation is not visualized. Aorta: The ascending aorta was not well visualized. IAS/Shunts: No atrial level shunt detected by color flow Doppler.  LEFT VENTRICLE PLAX 2D LVIDd:         4.60 cm   Diastology LVIDs:         3.20 cm   LV e' medial:    7.51 cm/s LV PW:         1.20 cm   LV E/e' medial:  8.9 LV IVS:        0.60 cm   LV e' lateral:   5.98 cm/s LVOT diam:     2.20 cm   LV E/e' lateral: 11.2 LV SV:         54 LV SV Index:   26 LVOT Area:     3.80 cm  RIGHT VENTRICLE RV Basal diam:  3.60 cm RV Mid diam:    3.40 cm RV S prime:     18.00 cm/s TAPSE (M-mode): 2.0 cm LEFT ATRIUM           Index        RIGHT ATRIUM          Index LA diam:      2.40 cm 1.18 cm/m   RA Area:     6.33 cm LA Vol (A2C): 34.8 ml 17.04 ml/m  RA Volume:   8.00 ml  3.92 ml/m LA Vol (A4C): 19.5 ml 9.55 ml/m  AORTIC VALVE AV Area (Vmax):    3.43 cm AV Area (Vmean):   3.30 cm AV  Area (VTI):     3.70 cm AV Vmax:           79.70 cm/s AV Vmean:          55.400 cm/s AV VTI:            0.146 m AV Peak Grad:      2.5 mmHg AV Mean Grad:      2.0 mmHg LVOT Vmax:         71.90 cm/s LVOT Vmean:        48.100 cm/s LVOT VTI:          0.142 m LVOT/AV VTI ratio: 0.97  AORTA Ao Root diam: 3.00 cm MITRAL VALVE  TRICUSPID VALVE MV Area (PHT): 7.02 cm    TR Peak grad:   8.2 mmHg MV Decel Time: 108 msec    TR Vmax:        143.00 cm/s MV E velocity: 67.10 cm/s MV A velocity: 59.20 cm/s  SHUNTS MV E/A ratio:  1.13        Systemic VTI:  0.14 m                            Systemic Diam: 2.20 cm Alwyn Pea MD Electronically signed by Alwyn Pea MD Signature Date/Time: 09/26/2023/1:51:03 PM    Final    CT CHEST ABDOMEN PELVIS W CONTRAST Result Date: 09/24/2023 CLINICAL DATA:  Follow-up non-small cell lung cancer EXAM: CT CHEST, ABDOMEN, AND PELVIS WITH CONTRAST TECHNIQUE: Multidetector CT imaging of the chest, abdomen and pelvis was performed following the standard protocol during bolus administration of intravenous contrast. RADIATION DOSE REDUCTION: This exam was performed according to the departmental dose-optimization program which includes automated exposure control, adjustment of the mA and/or kV according to patient size and/or use of iterative reconstruction technique. CONTRAST:  OMNIPAQUE IOHEXOL 300 MG/ML  SOLN COMPARISON:  CT abdomen/pelvis dated 08/13/2023. PET-CT dated 07/16/2023. FINDINGS: CT CHEST FINDINGS Cardiovascular: Heart is normal in size.  No pericardial effusion. No evidence of thoracic aortic aneurysm. Right chest port terminates at the cavoatrial junction. Mediastinum/Nodes: No suspicious mediastinal lymphadenopathy. Visualized thyroid is unremarkable. Lungs/Pleura: Moderate left pleural effusion with chronic pleural thickening. Mild complexity inferiorly (series 2/image 46). However, the effusion was non FDG avid on prior PET. Scarring/atelectasis in  the lingula and left lower lobe. New patchy/ground-glass opacities in the right upper lobe, raising concern for atypical infection/pneumonia. Mild interlobular septal thickening in the right middle and lower lobes, nonspecific. No pneumothorax. Musculoskeletal: Lytic metastasis in the posterior T3 vertebral body (series 6/image 75). CT ABDOMEN PELVIS FINDINGS Hepatobiliary: Lobular hepatic contour. Atrophy/scarring of the left hepatic lobe. Associated low-density in the left hepatic lobe suggests ill-defined metastatic disease (series 2/image 49), although difficult to measure. Additional 4.0 cm low-density lesion in segment 5 (series 2/image 57), similar to prior CT, although with only mild hypermetabolism on prior PET. Regardless, residual hepatic metastases are favored. Gallbladder is unremarkable. No intrahepatic or extrahepatic duct dilatation. Pancreas: Within normal limits. Spleen: Within normal limits. Adrenals/Urinary Tract: Adrenal glands are within normal limits. Kidneys are within normal limits.  No hydronephrosis. Bladder is within normal limits. Stomach/Bowel: Stomach is within normal limits. No evidence of bowel obstruction. Normal appendix (series 2/image 90). No colonic wall thickening or inflammatory changes. Vascular/Lymphatic: No evidence of abdominal aortic aneurysm. No suspicious abdominopelvic lymphadenopathy. Reproductive: Prostate is unremarkable. Other: Moderate abdominal ascites, partially loculated. This was non FDG avid on prior PET. Moderate body wall edema, new. Musculoskeletal: No focal osseous lesions. IMPRESSION: New patchy/ground-glass opacities in the right upper lobe, raising concern for atypical infection/pneumonia. Moderate left pleural effusion with chronic pleural thickening, unchanged. Suspected hepatic metastases, ill-defined/poorly evaluated, with superimposed left hepatic lobe scarring/atrophy. This appearance is similar to the prior. Stable lytic metastasis in the  posterior T3 vertebral body. Moderate abdominal ascites, unchanged. Moderate body wall edema, new. Electronically Signed   By: Charline Bills M.D.   On: 09/24/2023 20:03     Medications:    azithromycin Stopped (09/25/23 1706)   ceFEPime (MAXIPIME) IV 2 g (09/26/23 0958)     bisacodyl  10 mg Oral QHS   Chlorhexidine Gluconate Cloth  6  each Topical Daily   fluticasone furoate-vilanterol  1 puff Inhalation Daily   folic acid  1 mg Oral Daily   furosemide  20 mg Oral BID   lactulose  30 g Oral TID   metoprolol tartrate  25 mg Oral BID   pantoprazole (PROTONIX) IV  40 mg Intravenous Q12H   sodium chloride flush  3 mL Intravenous Q12H   acetaminophen **OR** acetaminophen, ondansetron **OR** ondansetron (ZOFRAN) IV, polyethylene glycol, traZODone  Assessment/ Plan:  Mr. [name] is a 42 y.o.  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 Hypervolemic.  -Patient has received 2 doses of tolvaptan 15 mg during this admission - Sodium 133 - Can consider salt tabs as outpatient due to patient intolerance at this time - Continue to reinforce fluid restriction of 32 ounces per day -Will order oral furosemide 20 mg twice daily. - With the patient's current comorbidities, will be difficult to maintain an appropriate sodium level.  Patient specific goal 128-132 or above. -Will schedule follow-up appointment in our office in 1 to 2 weeks.     LOS: 4 Kennadi Albany 12/12/20243:51 PM

## 2023-09-26 NOTE — Progress Notes (Signed)
[facility] CLINIC CARDIOLOGY PROGRESS NOTE       Patient ID: [name] MRN: [id number] DOB/AGE: November 09, 1980 42 y.o.  Admit date: 09/21/2023 Referring Physician Dr. Gillis Santa Primary Physician Pcp, No  Primary Cardiologist Dr. Lady Gary (last seen 2022) Reason for Consultation SVT  HPI: [name] is a 42 y.o. male  with a past medical history of stage IV lung adenocarcinoma with brain, liver, bone metastasis on Carboplatin-Alimta-Amivatimab, associated thrombocytopenia, Wolff-Parkinson-White syndrome on metoprolol who presented to the ED on 09/21/2023 for syncope. Cardiology was consulted for further evaluation.   Interval history: -Patient seen and examined this AM, feeling well overall.  -He denies any chest pain, shortness of breath, or palpitation symptoms.  -HR slightly elevated this AM with activity but improving with rest. BP stable.  Review of systems complete and found to be negative unless listed above    Past Medical History:  Diagnosis Date   Cancer (HCC)    Dyspnea    Heartburn    Pneumonia    Wolff-Parkinson-White (WPW) 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    Past Surgical History:  Procedure Laterality Date   FRACTURE SURGERY     broke femur when he was 6 years   PORTA CATH INSERTION N/A 03/28/2021   Procedure: PORTA CATH INSERTION;  Surgeon: Renford Dills, MD;  Location: [facility] INVASIVE CV LAB;  Service: Cardiovascular;  Laterality: N/A;   VIDEO BRONCHOSCOPY WITH ENDOBRONCHIAL NAVIGATION N/A 03/15/2021   Procedure: VIDEO BRONCHOSCOPY WITH ENDOBRONCHIAL NAVIGATION;  Surgeon: Vida Rigger, MD;  Location: [facility] ORS;  Service: Thoracic;  Laterality: N/A;   VIDEO BRONCHOSCOPY WITH ENDOBRONCHIAL ULTRASOUND N/A 03/15/2021   Procedure: VIDEO BRONCHOSCOPY WITH ENDOBRONCHIAL ULTRASOUND;  Surgeon: Vida Rigger, MD;  Location: [facility] ORS;  Service: Thoracic;  Laterality: N/A;    Medications Prior to Admission  Medication Sig Dispense Refill Last Dose/Taking   albuterol  (VENTOLIN HFA) 108 (90 Base) MCG/ACT inhaler Inhale 2 puffs into the lungs every 6 (six) hours as needed for wheezing or shortness of breath. 8 g 2 prn at unknown   calcium carbonate (TUMS EX) 750 MG chewable tablet Chew 1 tablet by mouth daily.   prn at unknown   folic acid (FOLVITE) 1 MG tablet TAKE 1 TABLET BY MOUTH EVERY DAY 90 tablet 1 09/20/2023   lidocaine-prilocaine (EMLA) cream Apply 1 Application topically as needed. 30 g 0 prn at unknown   methylphenidate (RITALIN) 5 MG tablet Take 1 tablet (5 mg total) by mouth 2 (two) times daily as needed (fatigue). 60 tablet 0 prn at unknown   metoCLOPramide (REGLAN) 10 MG tablet Take 1 tablet (10 mg total) by mouth every 8 (eight) hours as needed for nausea. 30 tablet 1 09/20/2023   montelukast (SINGULAIR) 10 MG tablet TAKE 1 TABLET BY MOUTH EVERYDAY AT BEDTIME 90 tablet 1 prn at unknown   OLANZapine (ZYPREXA) 5 MG tablet Take 1 tablet (5 mg total) by mouth at bedtime as needed (nausea). 30 tablet 0 09/20/2023   ondansetron (ZOFRAN-ODT) 4 MG disintegrating tablet Take 1 tablet (4 mg total) by mouth every 8 (eight) hours as needed for nausea or vomiting. 20 tablet 1 prn at unknown   prochlorperazine (COMPAZINE) 10 MG tablet Take 1 tablet (10 mg total) by mouth every 6 (six) hours as needed for nausea or vomiting. 30 tablet 0 09/21/2023   promethazine (PHENERGAN) 25 MG tablet Take 1 tablet (25 mg total) by mouth every 6 (six) hours as needed for nausea or vomiting. 30 tablet 0  prn at unknown   sertraline (ZOLOFT) 50 MG tablet Take 1 tablet (50 mg total) by mouth daily. 30 tablet 1 09/20/2023   sodium chloride 1 g tablet Take 1 tablet (1 g total) by mouth 3 (three) times daily. For low sodium levels 120 tablet 0 09/20/2023   triamcinolone ointment (KENALOG) 0.5 % Apply 1 Application topically 2 (two) times daily. 30 g 1 prn at unknown   ELIQUIS 2.5 MG TABS tablet TAKE 1 TABLET BY MOUTH TWICE A DAY (Patient not taking: Reported on 09/21/2023) 60 tablet 4 Not  Taking   metoprolol succinate (TOPROL-XL) 50 MG 24 hr tablet Take 50 mg by mouth daily. Take with or immediately following a meal. (Patient not taking: Reported on 09/21/2023)   Not Taking   Social History   Socioeconomic History   Marital status: Married    Spouse name: Darl Pikes   Number of children: 2   Years of education: Not on file   Highest education level: Not on file  Occupational History   Not on file  Tobacco Use   Smoking status: Never   Smokeless tobacco: Former    Types: Snuff, Chew  Vaping Use   Vaping status: Never Used  Substance and Sexual Activity   Alcohol use: Never   Drug use: Never   Sexual activity: Yes  Other Topics Concern   Not on file  Social History Narrative   [place]; with wife; 2 daughters[12 and 22]; never smoked; rare alcohol. 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 Dog and cat.   Social Drivers of Corporate investment banker Strain: Low Risk  (05/10/2022)   Overall Financial Resource Strain (CARDIA)    Difficulty of Paying Living Expenses: Not hard at all  Food Insecurity: No Food Insecurity (09/23/2023)   Hunger Vital Sign    Worried About Running Out of Food in the Last Year: Never true    Ran Out of Food in the Last Year: Never true  Transportation Needs: No Transportation Needs (09/23/2023)   PRAPARE - Administrator, Civil Service (Medical): No    Lack of Transportation (Non-Medical): No  Physical Activity: Not on file  Stress: Not on file  Social Connections: Socially Integrated (05/10/2022)   Social Connection and Isolation Panel [NHANES]    Frequency of Communication with Friends and Family: More than three times a week    Frequency of Social Gatherings with Friends and Family: More than three times a week    Attends Religious Services: More than 4 times per year    Active Member of Clubs or Organizations: Yes    Attends Banker Meetings: More than 4 times per year    Marital Status: Married  Catering manager Violence:  Unknown (09/23/2023)   Humiliation, Afraid, Rape, and Kick questionnaire    Fear of Current or Ex-Partner: Not on file    Emotionally Abused: No    Physically Abused: No    Sexually Abused: No    Family History  Problem Relation Age of Onset   High Cholesterol Mother    Hypertension Father    Lung cancer Father    Skin cancer Father      Vitals:   09/25/23 2100 09/26/23 0000 09/26/23 0400 09/26/23 0851  BP: 114/63 118/65 121/69 106/66  Pulse: 99 98 90 99  Resp:      Temp:  97.7 F (36.5 C) 97.7 F (36.5 C)   TempSrc:  Axillary Oral   SpO2: 100%   100%  Weight:      Height:        PHYSICAL EXAM General: Ill appearing male, well nourished, in no acute distress sitting upright in bedside chair with mother present at bedside. HEENT: Normocephalic and atraumatic. Neck: No JVD.  Lungs: Normal respiratory effort on room air. Clear bilaterally to auscultation. Rhonchi noted bilaterally.  Heart: Regular rhythm, fast rate. Normal S1 and S2 without gallops or murmurs.  Abdomen: Non-distended appearing.  Msk: Normal strength and tone for age. Extremities: Warm and well perfused. No clubbing, cyanosis.  Trace edema.  Neuro: Alert & oriented x3.  Labs: Basic Metabolic Panel: Recent Labs    09/24/23 1103 09/24/23 1853 09/25/23 0411 09/25/23 1100 09/25/23 2333 09/26/23 0455  NA 133*   < > 134*   < > 133* 133*  K 3.5   < > 3.7   < > 3.9 4.1  CL 100   < > 103   < > 100 102  CO2 25   < > 25   < > 27 28  GLUCOSE 147*   < > 147*   < > 116* 91  BUN 14   < > 12   < > 10 10  CREATININE 0.74   < > 0.74   < > 0.65 0.84  CALCIUM 8.2*   < > 8.3*   < > 7.9* 7.8*  MG 2.0  --  1.7  --   --   --   PHOS 2.5  --  2.8  --   --   --    < > = values in this interval not displayed.   Liver Function Tests: Recent Labs    09/25/23 0411 09/26/23 0455  AST 52* 70*  ALT 54* 74*  ALKPHOS 187* 203*  BILITOT 1.1 1.1  PROT 4.3* 4.3*  ALBUMIN 2.8* 2.4*   No results for input(s): "LIPASE",  "AMYLASE" in the last 72 hours. CBC: Recent Labs    09/25/23 0415 09/26/23 0455  WBC 14.5* 13.8*  HGB 8.3* 8.9*  HCT 23.7* 26.0*  MCV 92.9 97.0  PLT 80* 69*   Cardiac Enzymes: No results for input(s): "CKTOTAL", "CKMB", "CKMBINDEX", "TROPONINIHS" in the last 72 hours. BNP: No results for input(s): "BNP" in the last 72 hours. D-Dimer: No results for input(s): "DDIMER" in the last 72 hours. Hemoglobin A1C: No results for input(s): "HGBA1C" in the last 72 hours. Fasting Lipid Panel: No results for input(s): "CHOL", "HDL", "LDLCALC", "TRIG", "CHOLHDL", "LDLDIRECT" in the last 72 hours. Thyroid Function Tests: No results for input(s): "TSH", "T4TOTAL", "T3FREE", "THYROIDAB" in the last 72 hours.  Invalid input(s): "FREET3" Anemia Panel: No results for input(s): "VITAMINB12", "FOLATE", "FERRITIN", "TIBC", "IRON", "RETICCTPCT" in the last 72 hours.   Radiology: CT CHEST ABDOMEN PELVIS W CONTRAST Result Date: 09/24/2023 CLINICAL DATA:  Follow-up non-small cell lung cancer EXAM: CT CHEST, ABDOMEN, AND PELVIS WITH CONTRAST TECHNIQUE: Multidetector CT imaging of the chest, abdomen and pelvis was performed following the standard protocol during bolus administration of intravenous contrast. RADIATION DOSE REDUCTION: This exam was performed according to the departmental dose-optimization program which includes automated exposure control, adjustment of the mA and/or kV according to patient size and/or use of iterative reconstruction technique. CONTRAST:  OMNIPAQUE IOHEXOL 300 MG/ML  SOLN COMPARISON:  CT abdomen/pelvis dated 08/13/2023. PET-CT dated 07/16/2023. FINDINGS: CT CHEST FINDINGS Cardiovascular: Heart is normal in size.  No pericardial effusion. No evidence of thoracic aortic aneurysm. Right chest port terminates at the cavoatrial junction. Mediastinum/Nodes: No suspicious mediastinal  lymphadenopathy. Visualized thyroid is unremarkable. Lungs/Pleura: Moderate left pleural effusion with  chronic pleural thickening. Mild complexity inferiorly (series 2/image 46). However, the effusion was non FDG avid on prior PET. Scarring/atelectasis in the lingula and left lower lobe. New patchy/ground-glass opacities in the right upper lobe, raising concern for atypical infection/pneumonia. Mild interlobular septal thickening in the right middle and lower lobes, nonspecific. No pneumothorax. Musculoskeletal: Lytic metastasis in the posterior T3 vertebral body (series 6/image 75). CT ABDOMEN PELVIS FINDINGS Hepatobiliary: Lobular hepatic contour. Atrophy/scarring of the left hepatic lobe. Associated low-density in the left hepatic lobe suggests ill-defined metastatic disease (series 2/image 49), although difficult to measure. Additional 4.0 cm low-density lesion in segment 5 (series 2/image 57), similar to prior CT, although with only mild hypermetabolism on prior PET. Regardless, residual hepatic metastases are favored. Gallbladder is unremarkable. No intrahepatic or extrahepatic duct dilatation. Pancreas: Within normal limits. Spleen: Within normal limits. Adrenals/Urinary Tract: Adrenal glands are within normal limits. Kidneys are within normal limits.  No hydronephrosis. Bladder is within normal limits. Stomach/Bowel: Stomach is within normal limits. No evidence of bowel obstruction. Normal appendix (series 2/image 90). No colonic wall thickening or inflammatory changes. Vascular/Lymphatic: No evidence of abdominal aortic aneurysm. No suspicious abdominopelvic lymphadenopathy. Reproductive: Prostate is unremarkable. Other: Moderate abdominal ascites, partially loculated. This was non FDG avid on prior PET. Moderate body wall edema, new. Musculoskeletal: No focal osseous lesions. IMPRESSION: New patchy/ground-glass opacities in the right upper lobe, raising concern for atypical infection/pneumonia. Moderate left pleural effusion with chronic pleural thickening, unchanged. Suspected hepatic metastases,  ill-defined/poorly evaluated, with superimposed left hepatic lobe scarring/atrophy. This appearance is similar to the prior. Stable lytic metastasis in the posterior T3 vertebral body. Moderate abdominal ascites, unchanged. Moderate body wall edema, new. Electronically Signed   By: Charline Bills M.D.   On: 09/24/2023 20:03   US Paracentesis Result Date: 09/24/2023 INDICATION: History of metastatic lung cancer with recurrent malignant ascites. Request is for therapeutic paracentesis EXAM: ULTRASOUND GUIDED THERAPEUTIC PARACENTESIS MEDICATIONS: Lidocaine 1% 10 mL COMPLICATIONS: None immediate. PROCEDURE: Informed written consent was obtained from the patient after a discussion of the risks, benefits and alternatives to treatment. A timeout was performed prior to the initiation of the procedure. Initial ultrasound scanning demonstrates a small amount of ascites within the right lower abdominal quadrant. The right lower abdomen was prepped and draped in the usual sterile fashion. 1% lidocaine was used for local anesthesia. Following this, a 19 gauge, 7-cm, Yueh catheter was introduced. An ultrasound image was saved for documentation purposes. The paracentesis was performed. The catheter was removed and a dressing was applied. The patient tolerated the procedure well without immediate post procedural complication. Patient received post-procedure intravenous albumin; see nursing notes for details. FINDINGS: A total of approximately 2.7 L of straw-colored fluid was removed. Samples were sent to the laboratory as requested by the clinical team. IMPRESSION: Successful ultrasound-guided therapeutic paracentesis yielding 2.7 liters of peritoneal fluid. Performed by Anders Grant NP Electronically Signed   By: Judie Petit.  Shick M.D.   On: 09/24/2023 16:04   US THORACENTESIS ASP PLEURAL SPACE W/IMG GUIDE Result Date: 09/23/2023 INDICATION: Patient with a history of lung cancer with recurrent pleural effusions.  Interventional radiology asked to perform a therapeutic thoracentesis. EXAM: ULTRASOUND GUIDED THORACENTESIS MEDICATIONS: 1% lidocaine 15 mL COMPLICATIONS: None immediate. PROCEDURE: An ultrasound guided thoracentesis was thoroughly discussed with the patient and questions answered. The benefits, risks, alternatives and complications were also discussed. The patient understands and wishes to proceed with the procedure. Written consent  was obtained. Ultrasound was performed to localize and mark an adequate pocket of fluid in the left chest. The area was then prepped and draped in the normal sterile fashion. 1% Lidocaine was used for local anesthesia. Under ultrasound guidance a 6 Fr Safe-T-Centesis catheter was introduced. Thoracentesis was performed. The catheter was removed and a dressing applied. FINDINGS: A total of approximately 450 mL of dark red fluid was removed. IMPRESSION: Successful ultrasound guided left thoracentesis yielding 450 mL of pleural fluid. Procedure performed by Alwyn Ren NP Electronically Signed   By: Judie Petit.  Shick M.D.   On: 09/23/2023 11:02   DG Chest Port 1 View Result Date: 09/23/2023 CLINICAL DATA:  Status post left thoracentesis. EXAM: PORTABLE CHEST 1 VIEW COMPARISON:  September 21, 2023. FINDINGS: No pneumothorax is noted status post left thoracentesis. Left pleural effusion is slightly decreased compared to prior exam. IMPRESSION: No pneumothorax status post left thoracentesis. Electronically Signed   By: Lupita Raider M.D.   On: 09/23/2023 10:54   DG Chest 2 View Result Date: 09/21/2023 CLINICAL DATA:  Suspected sepsis. Lung and liver cancer per patient. Vomiting. EXAM: CHEST - 2 VIEW COMPARISON:  Chest radiographs 09/09/2023 and 09/06/2023. Chest CT 07/16/2022. FINDINGS: Right IJ Port-A-Cath extends to the level of the superior cavoatrial junction, stable. The visualized heart size and mediastinal contours are stable. A large laterally loculated left pleural effusion  appears slightly increased compared with the most recent prior study. Associated chronic compressive left lung atelectasis. No evidence of right-sided pleural effusion or pneumothorax. Mild right basilar atelectasis, unchanged. The bones appear unchanged. IMPRESSION: Slight increase in size of a large laterally loculated left pleural effusion with associated chronic compressive left lung atelectasis. No other significant changes. Electronically Signed   By: Carey Bullocks M.D.   On: 09/21/2023 14:19   US Paracentesis Result Date: 09/17/2023 INDICATION: Metastatic lung cancer and malignant ascites. Request for therapeutic paracentesis. EXAM: ULTRASOUND GUIDED PARACENTESIS MEDICATIONS: 1% lidocaine 10 mL COMPLICATIONS: None immediate. PROCEDURE: Informed written consent was obtained from the patient after a discussion of the risks, benefits and alternatives to treatment. A timeout was performed prior to the initiation of the procedure. Initial ultrasound scanning demonstrates a large amount of ascites within the right lateral abdomen. The right lateral abdomen was prepped and draped in the usual sterile fashion. 1% lidocaine was used for local anesthesia. Following this, a 19 gauge, 7-cm, Yueh catheter was introduced. An ultrasound image was saved for documentation purposes. The paracentesis was performed. The catheter was removed and a dressing was applied. The patient tolerated the procedure well without immediate post procedural complication. FINDINGS: A total of approximately 5.6 L of clear yellow fluid was removed. IMPRESSION: Successful ultrasound-guided paracentesis yielding 5.6 liters of peritoneal fluid. Electronically Signed   By: Gilmer Mor D.O.   On: 09/17/2023 12:03   DG Chest Port 1 View Result Date: 09/09/2023 CLINICAL DATA:  Left lower lobe lung cancer. Status post left thoracentesis. EXAM: PORTABLE CHEST 1 VIEW COMPARISON:  09/06/2023 FINDINGS: Power injectable Port-A-Cath tip: Cavoatrial  junction. Minimal increase in aeration in the left lung, large left pleural effusion observed. No pneumothorax or radiographic evidence of procedure complication. The right lung remains clear. Heart size within normal limits although the left apical border remains obscured. IMPRESSION: 1. Minimal increase in aeration in the left lung, large left pleural effusion observed. No pneumothorax or radiographic evidence of procedure complication. Electronically Signed   By: Gaylyn Rong M.D.   On: 09/09/2023 14:38   US THORACENTESIS  ASP PLEURAL SPACE W/IMG GUIDE Result Date: 09/09/2023 INDICATION: Patient with history of left lung cancer with recurrent left-sided pleural effusion. Request is for therapeutic thoracentesis EXAM: ULTRASOUND GUIDED THERAPEUTIC LEFT-SIDED THORACENTESIS MEDICATIONS: Lidocaine 1% 10 mL COMPLICATIONS: None immediate. PROCEDURE: An ultrasound guided thoracentesis was thoroughly discussed with the patient and questions answered. The benefits, risks, alternatives and complications were also discussed. The patient understands and wishes to proceed with the procedure. Written consent was obtained. Ultrasound was performed to localize and mark an adequate pocket of fluid in the left chest. The area was then prepped and draped in the normal sterile fashion. 1% Lidocaine was used for local anesthesia. Under ultrasound guidance a 6 Fr Safe-T-Centesis catheter was introduced. Thoracentesis was performed. The catheter was removed and a dressing applied. FINDINGS: A total of approximately 450 mL of reddish brown fluid was removed. Samples were sent to the laboratory as requested by the clinical team. IMPRESSION: Successful ultrasound guided therapeutic left-sided thoracentesis yielding 450 mL of pleural fluid. Performed by Anders Grant NP Electronically Signed   By: Judie Petit.  Shick M.D.   On: 09/09/2023 13:58   DG Chest 2 View Result Date: 09/06/2023 CLINICAL DATA:  Cough and shortness of breath.  History of lung cancer. EXAM: CHEST - 2 VIEW COMPARISON:  Chest x-ray dated August 13, 2023. FINDINGS: Unchanged right chest wall port catheter. Stable cardiomediastinal silhouette with normal heart size. Unchanged chronic loculated large left pleural effusion with atelectasis in the lingula and left lower lobe. Slightly increased interstitial thickening in the right lung. No pneumothorax. No acute osseous abnormality. IMPRESSION: 1. Slightly increased interstitial thickening in the right lung, which may reflect atypical infection or pulmonary edema. 2. Unchanged chronic loculated large left pleural effusion. Electronically Signed   By: Obie Dredge M.D.   On: 09/06/2023 17:11   US Paracentesis Result Date: 09/06/2023 INDICATION: malignant ascites EXAM: ULTRASOUND GUIDED  PARACENTESIS MEDICATIONS: None. COMPLICATIONS: None immediate. PROCEDURE: Informed written consent was obtained from the patient after a discussion of the risks, benefits and alternatives to treatment. A timeout was performed prior to the initiation of the procedure. Initial ultrasound scanning demonstrates a large amount of ascites within the right lower abdominal quadrant. The right lower abdomen was prepped and draped in the usual sterile fashion. 1% lidocaine was used for local anesthesia. Following this, a 19 gauge, 7-cm, Yueh catheter was introduced. An ultrasound image was saved for documentation purposes. The paracentesis was performed. The catheter was removed and a dressing was applied. The patient tolerated the procedure well without immediate post procedural complication. FINDINGS: A total of approximately 4.8 L of clear, straw-colored fluid was removed. IMPRESSION: Successful ultrasound-guided paracentesis yielding 4.8 liters of peritoneal fluid. Electronically Signed   By: Olive Bass M.D.   On: 09/06/2023 16:39    ECHO pending  TELEMETRY reviewed by me 09/26/2023: Sinus tachycardia rate 120-130s  EKG reviewed by me:  supraventricular tachycardia rate 172 bpm  Data reviewed by me 09/26/2023: last 24h vitals tele labs imaging I/O ED provider note, admission H&P, hospitalist progress notes, oncolopy notes, nephrology notes  Principal Problem:   Syncope Active Problems:   WPW (Wolff-Parkinson-White syndrome)   Hyponatremia   Loculated pleural effusion   Goals of care, counseling/discussion   Intractable nausea and vomiting   Adenocarcinoma of lung, stage 4, left (HCC)   Lactic acidosis   AKI (acute kidney injury) (HCC)   Ascites   Syncope and collapse    ASSESSMENT AND PLAN:  [name] is a 42 y.o.  male  with a past medical history of stage IV lung adenocarcinoma with brain, liver, bone metastasis on Carboplatin-Alimta-Amivatimab, associated thrombocytopenia, Wolff-Parkinson-White syndrome on metoprolol who presented to the ED on 09/21/2023 for syncope. Cardiology was consulted for further evaluation.   # Wolff-Parkinson-White Syndrome # Supraventricular tachycardia Patient with long-standing hx of WPW and prior episodes of SVT admitted for syncope 2/2 volume depletion and hyponatremia. Had an episode of SVT early morning on 12/10 which required one dose of adenosine for conversion to NSR. Has maintained in NSR since this time. Periods of sinus tach with activity which improves with rest. -Continue metoprolol 25 mg twice daily.  -Echo ordered for additional evaluation.  -Continue to monitor for recurrence of SVT.  -Monitor and replenish electrolytes for a goal K >4, Mag >2    # Syncope # Intractable nausea and vomiting # Electrolyte derangements Patient initially presented after syncopal episode, recent intractable nausea/vomiting with intravascular volume depletion and multiple electrolyte derangements.  -Management per primary.  # Stage IV adenocarcinoma of the lung # Recurrent malignant L pleural effusion -Management per primary, oncology.    This patient's plan of care was discussed  and created with Dr. Juliann Pares and he is in agreement.  Signed: Gale Journey, PA-C  09/26/2023, 10:48 AM [facility]

## 2023-09-26 NOTE — TOC Transition Note (Signed)
Transition of Care [facility]) - Discharge Note   Patient Details  Name: [name] MRN: [id number] Date of Birth: May 12, 1981  Transition of Care [facility]) CM/SW Contact:  Margarito Liner, LCSW Phone Number: 09/26/2023, 4:46 PM   Clinical Narrative:   Patient had orders to discharge home today and has already left the hospital. [facility] has accepted referral for PT. They cannot start services until after 12/18. MD is aware and agreeable. The [facility] liaison will call the patient/wife. Dr. Sherryll Burger will cover the orders since he doesn't have a PCP. No further concerns. CSW signing off.  Final next level of care: Home w Home Health Services Barriers to Discharge: Barriers Resolved   Patient Goals and CMS Choice            Discharge Placement                Patient to be transferred to facility by: Family Name of family member notified: Demetrios Amarante Patient and family notified of of transfer: 09/26/23  Discharge Plan and Services Additional resources added to the After Visit Summary for       Post Acute Care Choice: Home Health                    HH Arranged: PT [facility] Agency: [facility] Date [facility] Agency Contacted: 09/26/23   Representative spoke with at [facility] Agency: Unknown [place]  Social Drivers of Health (SDOH) Interventions SDOH Screenings   Food Insecurity: No Food Insecurity (09/23/2023)  Housing: Patient Declined (09/23/2023)  Transportation Needs: No Transportation Needs (09/23/2023)  Utilities: Not At Risk (09/23/2023)  Financial Resource Strain: Low Risk  (05/10/2022)  Social Connections: Socially Integrated (05/10/2022)  Tobacco Use: Medium Risk (09/21/2023)     Readmission Risk Interventions     No data to display

## 2023-09-26 NOTE — TOC Initial Note (Addendum)
Transition of Care [facility]) - Initial/Assessment Note    Patient Details  Name: TRENDEN SUPPES MRN: [id number] Date of Birth: 1980-11-09  Transition [facility]) CM/SW Contact:    Margarito Liner, LCSW Phone Number: 09/26/2023, 2:43 PM  Clinical Narrative:  CSW met with patient. Wife and father at bedside. CSW introduced role and explained that PT recommendations would be discussed. Patient and family are agreeable to home health. CSW started search. [facility], [facility], [facility], [facility], [facility], and [facility] are not able to accept. Left messages for [facility] and [facility]. CSW sent information to [place] liaison in a secure email.   3:10 pm: [facility] is unable to accept referral.  Expected Discharge Plan: Home w Home Health Services Barriers to Discharge: Continued Medical Work up   Patient Goals and CMS Choice            Expected Discharge Plan and Services     Post Acute Care Choice: Home Health Living arrangements for the past 2 months: Single Family Home Expected Discharge Date: 09/26/23                                    Prior Living Arrangements/Services Living arrangements for the past 2 months: Single Family Home Lives with:: Spouse, Adult Children Patient language and need for interpreter reviewed:: Yes Do you feel safe going back to the place where you live?: Yes      Need for Family Participation in Patient Care: Yes (Comment) Care giver support system in place?: Yes (comment)   Criminal Activity/Legal Involvement Pertinent to Current Situation/Hospitalization: No - Comment as needed  Activities of Daily Living   ADL Screening (condition at time of admission) Independently performs ADLs?: Yes (appropriate for developmental age) Is the patient deaf or have difficulty hearing?: No Does the patient have difficulty seeing, even when wearing glasses/contacts?: No Does the patient have difficulty concentrating, remembering, or making decisions?:  No  Permission Sought/Granted Permission sought to share information with : Facility Medical sales representative, Family Supports Permission granted to share information with : Yes, Verbal Permission Granted  Share Information with NAME: Drayton Matlock  Permission granted to share info w AGENCY: Home Health Agencies  Permission granted to share info w Relationship: Wife, Father at bedside  Permission granted to share info w Contact Information: [phone]  Emotional Assessment Appearance:: Appears stated age Attitude/Demeanor/Rapport: Engaged, Gracious Affect (typically observed): Accepting, Appropriate, Calm, Pleasant Orientation: : Oriented to Self, Oriented to Place, Oriented to  Time, Oriented to Situation Alcohol / Substance Use: Not Applicable Psych Involvement: No (comment)  Admission diagnosis:  Syncope and collapse [R55] Dehydration [E86.0] Hyponatremia [E87.1] Syncope [R55] Nausea and vomiting, unspecified vomiting type [R11.2] Patient Active Problem List   Diagnosis Date Noted   Syncope and collapse 09/24/2023   Syncope 09/21/2023   Adenocarcinoma of lung, stage 4, left (HCC) 09/21/2023   Lactic acidosis 09/21/2023   AKI (acute kidney injury) (HCC) 09/21/2023   Ascites 09/21/2023   Intractable nausea and vomiting 09/14/2023   Essential hypertension 09/14/2023   Depression 09/14/2023   Adjustment disorder, unspecified 08/15/2023   Goals of care, counseling/discussion 10/02/2022   Encounter for monoclonal antibody treatment for malignancy 10/02/2022   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   Malignant neoplasm of lung (HCC) 05/15/2021   Malignant neoplasm metastatic to brain [facility])    Loculated pleural effusion    Palliative care encounter    SVT (supraventricular tachycardia) (HCC) 03/28/2021  WPW (Wolff-Parkinson-White syndrome) 03/28/2021   Hyponatremia 03/28/2021   Transaminitis 03/28/2021   B12 deficiency 03/24/2021   Cancer of lower lobe of left lung  (HCC) 03/23/2021   PCP:  Pcp, No Pharmacy:   [id number] [place], [place] - [address] AT [facility] [address] [place] [address] Phone: [phone] Fax: [phone]  [facility] - [facility] [address] [place] [address] Phone: [phone] Fax: [phone]  [facility] [place] [place] - [address] [facility] [address] [place] [address] Phone: [phone] Fax: [phone]  [facility] - [facility] [address] [place] [address] Phone: [phone] Fax: [phone]     Social Drivers of Health (SDOH) Social History: SDOH Screenings   Food Insecurity: No Food Insecurity (09/23/2023)  Housing: Patient Declined (09/23/2023)  Transportation Needs: No Transportation Needs (09/23/2023)  Utilities: Not At Risk (09/23/2023)  Financial Resource Strain: Low Risk  (05/10/2022)  Social Connections: Socially Integrated (05/10/2022)  Tobacco Use: Medium Risk (09/21/2023)   SDOH Interventions:     Readmission Risk Interventions     No data to display

## 2023-09-26 NOTE — Progress Notes (Signed)
*  PRELIMINARY RESULTS* Echocardiogram 2D Echocardiogram has been performed.  Cristela Blue 09/26/2023, 11:00 AM

## 2023-09-26 NOTE — Discharge Summary (Signed)
[facility] Discharge Summary   Patient: [name] QMV:[id number]  PCP: Pcp, No  Date of admission: 09/21/2023   Date of discharge:  09/26/2023     Discharge Diagnoses:  Principal Problem:   Syncope Active Problems:   Lactic acidosis   Hyponatremia   AKI (acute kidney injury) (HCC)   Loculated pleural effusion   Ascites   Intractable nausea and vomiting   WPW (Wolff-Parkinson-White syndrome)   Adenocarcinoma of lung, stage 4, left (HCC)   Goals of care, counseling/discussion   Syncope and collapse   Admitted From: Home Disposition:  Home with home PT  Recommendations for Outpatient Follow-up:  Follow-up with PCP in 1 week Follow-up with cardiology as per schedule Follow-up with oncologist as per schedule. Follow up LABS/TEST:  CBC and BMP in 1 wk   Follow-up Information     Alluri, Meryl Dare, MD. Go in 1 week(s).   Specialty: Cardiology Contact information: [address] [place] [address] [phone]                Diet recommendation: Regular diet  Activity: The patient is advised to gradually reintroduce usual activities, as tolerated  Discharge Condition: stable  Code Status: DNR/DNI- limited  History of present illness: As per the H and P dictated on admission Hospital Course:  [name] is a 41 y.o. male with medical history significant of stage IV lung adenocarcinoma with brain, liver, bone metastasis on Carboplatin-Alimta-Amivatimab, associated thrombocytopenia, Wolff-Parkinson-White syndrome on metoprolol, who presents to the ED due to syncope.  Patient is having nausea and vomiting for a few days, today patient was noted to have significant dyspnea on exertion when he went out for lunch.  Patient got home back and his legs gave out, suddenly LOC and patient passed out, caught by his father and wife, no trauma.  Patient regained consciousness quickly.  No seizure activity.   ED workup: VS HR 130 tachycardia, RR 22  tachypneic, BP 118/83, 99% on 2 L, afebrile BMP hyponatremia sodium 122, calcium 7.0 low could be due to low albumin 1.9 mildly elevated AST and ALT, bilirubin 2.1 elevated Lactic acidosis lactic acid 6.0 most likely due to intravascular volume depletion Chronic anemia hemoglobin 12.2, thrombocytopenia platelet 59 Blood culture sent CXR: Slight increase in size of a large laterally loculated left pleural effusion with associated chronic compressive left lung atelectasis. No other significant changes.     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 Serum osmolality 252 low. S/p tolvaptan 15 mg daily x 2 doses given by nephrology.  Patient was treated with salt tablets 2 g p.o. 3 times daily.  Sodium level 132, gradually improved.  Follow with PCP to repeat BMP after 1 week. # Recurrent malignant left pleural effusion 11/25 s/p thoracentesis IR consulted to repeat thoracentesis 12/9 s/p left thora dark red fluid was tapped, No pneumothorax status post left thoracentesis.  S/p IV Lasix gtt, d/c'd by nephro on 12/9 S/p IV albumin 12.5 g every 6 hourly for 2 days till 12/10 # Anasarca due to liver cirrhosis and Ascites  12/3 S/p paracentesis 12/10 s/p paracentesis 2.7 L fluid was tapped S/p IV Lasix gtt, d/c'd by nephro on 12/9 S/p IV albumin 12.5 g every 6 hourly for 2 days till 12/10   # COPD exacerbation possible pneumonia 12/8 patient was wheezing on my exam S/p Solu-Medrol IV 40 mg Q12 hourly for 3 days 12/10 started  azithromycin 500 mg IV daily. And on 12/11 started cefepime 2 g IV every 8 hourly due to possibility of pneumonia on CT abdomen and pelvis ordered by oncologist.  Patient was given DuoNeb every 6 hourly and Breo Ellipta inhaler. Breathing improved.  Discharged on Levaquin 750 mg p.o. daily for 7 days.  # Syncope, likely due to orthostatic hypotension Patient has intravascular  volume depletion due to low albumin S/p 2 L bolus given in the ED. Due to liver cirrhosis and peripheral edema patient was given albumin 12.5 g every 6 hourly x 3 doses Symptoms improved, patient did work with PT.  Recommended home PT. # Lactic acidosis and metabolic acidosis most likely due to intractable vomiting and dehydration  # Intractable nausea and vomiting S/p vancomycin, cefepime and Flagyl 1 dose given in the ED, no more need of antibiotics, no signs of infection. S/p IV fluid given, lactic acidosis resolved. bicarb level improved. S/p symptomatic treatment of nausea and vomiting.  And PPI given for GI prophylaxis.  Currently patient is asymptomatic, tolerating diet well.  No nausea vomiting. # Hypokalemia, potassium repleted. # Hypomagnesemia, mag repleted. # Anemia of chronic disease, Hb 12.2---9.2--8.9, follow with PCP to repeat CBC after 1 week. 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 No bleeding, repeat CBC after 1 week, follow with PCP. # Coagulopathy due to liver cirrhosis, INR 2.0.No bleeding # Transaminitis, decompensated liver cirrhosis # WPW (Wolff-Parkinson-White syndrome) Long-term history of Wolff-Parkinson-White syndrome on metoprolol.  Syncopal episode consistent with orthostasis, however will monitor on telemetry overnight. s/p home metoprolol 12.5 mg p.o. twice daily 12/10 early morning patient had an episode of tachycardia, received adenosine 6 mg x 1 dose, Lopressor 5 mg IV, Cardizem 10 mg IV cardiology consulted. On 12/11 cardiology increased dose of metoprolol 25 mg p.o. twice daily.  Patient was stable and cleared for discharge. # Depression, Home meds Zoloft and olanzapine d/c'd on 12/9 due to possible causing SIADH and hyponatremia.  Discontinued Zoloft and olanzapine on discharge as patient did not like the side effects.  Recommend to follow-up with PCP and psych as an outpatient. # Adenocarcinoma of lung, stage 4,  left History of stage IV adenocarcinoma of the left lung with metastasis to the pleural space, liver, brain, bone.  Currently undergoing chemotherapy and is hoping to get a second opinion at [facility] Oncology consulted 12/10 CT abdomen & pelvis done.  Follow-up with oncologist as an outpatient.   Overall long-term prognosis is poor.  Goals of care discussed patient's wife at bedside, who agreed with DNR/DNI and palliative care was consulted.  Patient's condition improved, stable to discharge home.  Agreed with the discharge planning.  Body mass index is 27.26 kg/m.  Nutrition Interventions:  - Patient was instructed, not to drive, operate heavy machinery, perform activities at heights, swimming or participation in water activities or provide baby sitting services while on Pain, Sleep and Anxiety Medications; until his outpatient Physician has advised to do so again.  - Also recommended to not to take more than prescribed Pain, Sleep and Anxiety Medications.  Patient was seen by physical therapy, who recommended Home health, which was arranged. On the day of the discharge the patient's vitals were stable, and no other acute medical condition were reported by patient. the patient was felt safe to be discharge at Home with Home health.  Consultants: Nephrology, oncology Procedures: Thoracentesis and paracentesis  Discharge Exam: General: Appear in no distress, Oral Mucosa Clear, moist. Cardiovascular: S1 and S2  Present, no Murmur, Respiratory: Equal air entry bilaterally, mild bibasilar crackles, mild expiratory wheeze due to bending of neck, positional lightheaded off-and-on. Abdomen: Bowel Sound present, Soft, NT and mildly distended due to ascites. Extremities: no Pedal edema, no calf tenderness Neurology: alert and oriented to time, place, and person affect appropriate.  Filed Weights   09/21/23 1340  Weight: 86.2 kg   Vitals:   09/26/23 0851 09/26/23 1145  BP: 106/66 102/81   Pulse: 99 94  Resp:    Temp:    SpO2: 100% 100%    DISCHARGE MEDICATION: Allergies as of 09/26/2023       Reactions   Codeine    Makes pt hyper        Medication List     STOP taking these medications    Eliquis 2.5 MG Tabs tablet Generic drug: apixaban   metoprolol succinate 50 MG 24 hr tablet Commonly known as: TOPROL-XL   OLANZapine 5 MG tablet Commonly known as: ZyPREXA   sertraline 50 MG tablet Commonly known as: ZOLOFT   sodium chloride 1 g tablet       TAKE these medications    albuterol 108 (90 Base) MCG/ACT inhaler Commonly known as: VENTOLIN HFA Inhale 2 puffs into the lungs every 6 (six) hours as needed for wheezing or shortness of breath.   bisacodyl 5 MG EC tablet Commonly known as: DULCOLAX Take 2 tablets (10 mg total) by mouth at bedtime as needed for severe constipation.   calcium carbonate 750 MG chewable tablet Commonly known as: TUMS EX Chew 1 tablet by mouth daily.   folic acid 1 MG tablet Commonly known as: FOLVITE TAKE 1 TABLET BY MOUTH EVERY DAY   furosemide 20 MG tablet Commonly known as: LASIX Take 2 tablets (40 mg total) by mouth daily.   lactulose 10 GM/15ML solution Commonly known as: CHRONULAC Take 30 mLs (20 g total) by mouth 2 (two) times daily as needed for mild constipation or moderate constipation (Titrate to 1-2 BM per day).   levofloxacin 750 MG tablet Commonly known as: Levaquin Take 1 tablet (750 mg total) by mouth daily for 7 days.   lidocaine-prilocaine cream Commonly known as: EMLA Apply 1 Application topically as needed.   methylphenidate 5 MG tablet Commonly known as: Ritalin Take 1 tablet (5 mg total) by mouth 2 (two) times daily as needed (fatigue).   metoCLOPramide 10 MG tablet Commonly known as: REGLAN Take 1 tablet (10 mg total) by mouth every 8 (eight) hours as needed for nausea.   metoprolol tartrate 25 MG tablet Commonly known as: LOPRESSOR Take 1 tablet (25 mg total) by mouth 2  (two) times daily.   montelukast 10 MG tablet Commonly known as: SINGULAIR TAKE 1 TABLET BY MOUTH EVERYDAY AT BEDTIME   ondansetron 4 MG disintegrating tablet Commonly known as: ZOFRAN-ODT Take 1 tablet (4 mg total) by mouth every 8 (eight) hours as needed for nausea or vomiting.   pantoprazole 40 MG tablet Commonly known as: Protonix Take 1 tablet (40 mg total) by mouth daily.   prochlorperazine 10 MG tablet Commonly known as: COMPAZINE Take 1 tablet (10 mg total) by mouth every 6 (six) hours as needed for nausea or vomiting.   promethazine 25 MG tablet Commonly known as: PHENERGAN Take 1 tablet (25 mg total) by mouth every 6 (six) hours as needed for nausea or vomiting.   traZODone 50 MG tablet Commonly known as: DESYREL Take 0.5 tablets (25 mg total) by mouth at bedtime as needed  for sleep.   triamcinolone ointment 0.5 % Commonly known as: KENALOG Apply 1 Application topically 2 (two) times daily.       Allergies  Allergen Reactions   Codeine     Makes pt hyper   Discharge Instructions     Call MD for:  difficulty breathing, headache or visual disturbances   Complete by: As directed    Call MD for:  extreme fatigue   Complete by: As directed    Call MD for:  persistant dizziness or light-headedness   Complete by: As directed    Call MD for:  persistant nausea and vomiting   Complete by: As directed    Call MD for:  severe uncontrolled pain   Complete by: As directed    Call MD for:  temperature >100.4   Complete by: As directed    Diet general   Complete by: As directed    Fluid restriction 1.5 to 2/day   Discharge instructions   Complete by: As directed    Follow-up with PCP in 1 week Follow-up with cardiology as per schedule Follow-up with oncologist as per schedule.   Increase activity slowly   Complete by: As directed        The results of significant diagnostics from this hospitalization (including imaging, microbiology, ancillary and  laboratory) are listed below for reference.    Significant Diagnostic Studies: ECHOCARDIOGRAM COMPLETE Result Date: 09/26/2023    ECHOCARDIOGRAM REPORT   Patient Name:   [name] Date of Exam: 09/26/2023 Medical Rec #:  [id number]       Height:       70.0 in Accession #:    [id number]      Weight:       190.0 lb Date of Birth:  Apr 23, 1981       BSA:          2.042 m Patient Age:    42 years        BP:           121/69 mmHg Patient Gender: M               HR:           90 bpm. Exam Location:  [facility] Procedure: 2D Echo, Cardiac Doppler and Color Doppler Indications:     Syncope R55  History:         Patient has no prior history of Echocardiogram examinations.                  Arrythmias:WPW; Signs/Symptoms:Dyspnea.  Sonographer:     Cristela Blue Referring Phys:  [id number] CARALYN HUDSON Diagnosing Phys: Alwyn Pea MD  Sonographer Comments: Suboptimal apical window. IMPRESSIONS  1. Left ventricular ejection fraction, by estimation, is 55 to 60%. The left ventricle has normal function. The left ventricle has no regional wall motion abnormalities. Left ventricular diastolic parameters were normal.  2. Right ventricular systolic function is mildly reduced. The right ventricular size is moderately enlarged. Mildly increased right ventricular wall thickness.  3. The mitral valve is normal in structure. Trivial mitral valve regurgitation.  4. The aortic valve is normal in structure. Aortic valve regurgitation is not visualized. FINDINGS  Left Ventricle: Left ventricular ejection fraction, by estimation, is 55 to 60%. The left ventricle has normal function. The left ventricle has no regional wall motion abnormalities. The left ventricular internal cavity size was normal in size. There is  no left ventricular hypertrophy. Left ventricular diastolic parameters were normal. Right  Ventricle: The right ventricular size is moderately enlarged. Mildly increased right ventricular wall thickness. Right ventricular systolic  function is mildly reduced. Left Atrium: Left atrial size was normal in size. Right Atrium: Right atrial size was normal in size. Pericardium: There is no evidence of pericardial effusion. Mitral Valve: The mitral valve is normal in structure. Trivial mitral valve regurgitation. Tricuspid Valve: The tricuspid valve is normal in structure. Tricuspid valve regurgitation is trivial. Aortic Valve: The aortic valve is normal in structure. Aortic valve regurgitation is not visualized. Aortic valve mean gradient measures 2.0 mmHg. Aortic valve peak gradient measures 2.5 mmHg. Aortic valve area, by VTI measures 3.70 cm. Pulmonic Valve: The pulmonic valve was normal in structure. Pulmonic valve regurgitation is not visualized. Aorta: The ascending aorta was not well visualized. IAS/Shunts: No atrial level shunt detected by color flow Doppler.  LEFT VENTRICLE PLAX 2D LVIDd:         4.60 cm   Diastology LVIDs:         3.20 cm   LV e' medial:    7.51 cm/s LV PW:         1.20 cm   LV E/e' medial:  8.9 LV IVS:        0.60 cm   LV e' lateral:   5.98 cm/s LVOT diam:     2.20 cm   LV E/e' lateral: 11.2 LV SV:         54 LV SV Index:   26 LVOT Area:     3.80 cm  RIGHT VENTRICLE RV Basal diam:  3.60 cm RV Mid diam:    3.40 cm RV S prime:     18.00 cm/s TAPSE (M-mode): 2.0 cm LEFT ATRIUM           Index        RIGHT ATRIUM          Index LA diam:      2.40 cm 1.18 cm/m   RA Area:     6.33 cm LA Vol (A2C): 34.8 ml 17.04 ml/m  RA Volume:   8.00 ml  3.92 ml/m LA Vol (A4C): 19.5 ml 9.55 ml/m  AORTIC VALVE AV Area (Vmax):    3.43 cm AV Area (Vmean):   3.30 cm AV Area (VTI):     3.70 cm AV Vmax:           79.70 cm/s AV Vmean:          55.400 cm/s AV VTI:            0.146 m AV Peak Grad:      2.5 mmHg AV Mean Grad:      2.0 mmHg LVOT Vmax:         71.90 cm/s LVOT Vmean:        48.100 cm/s LVOT VTI:          0.142 m LVOT/AV VTI ratio: 0.97  AORTA Ao Root diam: 3.00 cm MITRAL VALVE               TRICUSPID VALVE MV Area (PHT): 7.02 cm     TR Peak grad:   8.2 mmHg MV Decel Time: 108 msec    TR Vmax:        143.00 cm/s MV E velocity: 67.10 cm/s MV A velocity: 59.20 cm/s  SHUNTS MV E/A ratio:  1.13        Systemic VTI:  0.14 m  Systemic Diam: 2.20 cm Alwyn Pea MD Electronically signed by Alwyn Pea MD Signature Date/Time: 09/26/2023/1:51:03 PM    Final    CT CHEST ABDOMEN PELVIS W CONTRAST Result Date: 09/24/2023 CLINICAL DATA:  Follow-up non-small cell lung cancer EXAM: CT CHEST, ABDOMEN, AND PELVIS WITH CONTRAST TECHNIQUE: Multidetector CT imaging of the chest, abdomen and pelvis was performed following the standard protocol during bolus administration of intravenous contrast. RADIATION DOSE REDUCTION: This exam was performed according to the departmental dose-optimization program which includes automated exposure control, adjustment of the mA and/or kV according to patient size and/or use of iterative reconstruction technique. CONTRAST:  OMNIPAQUE IOHEXOL 300 MG/ML  SOLN COMPARISON:  CT abdomen/pelvis dated 08/13/2023. PET-CT dated 07/16/2023. FINDINGS: CT CHEST FINDINGS Cardiovascular: Heart is normal in size.  No pericardial effusion. No evidence of thoracic aortic aneurysm. Right chest port terminates at the cavoatrial junction. Mediastinum/Nodes: No suspicious mediastinal lymphadenopathy. Visualized thyroid is unremarkable. Lungs/Pleura: Moderate left pleural effusion with chronic pleural thickening. Mild complexity inferiorly (series 2/image 46). However, the effusion was non FDG avid on prior PET. Scarring/atelectasis in the lingula and left lower lobe. New patchy/ground-glass opacities in the right upper lobe, raising concern for atypical infection/pneumonia. Mild interlobular septal thickening in the right middle and lower lobes, nonspecific. No pneumothorax. Musculoskeletal: Lytic metastasis in the posterior T3 vertebral body (series 6/image 75). CT ABDOMEN PELVIS FINDINGS Hepatobiliary:  Lobular hepatic contour. Atrophy/scarring of the left hepatic lobe. Associated low-density in the left hepatic lobe suggests ill-defined metastatic disease (series 2/image 49), although difficult to measure. Additional 4.0 cm low-density lesion in segment 5 (series 2/image 57), similar to prior CT, although with only mild hypermetabolism on prior PET. Regardless, residual hepatic metastases are favored. Gallbladder is unremarkable. No intrahepatic or extrahepatic duct dilatation. Pancreas: Within normal limits. Spleen: Within normal limits. Adrenals/Urinary Tract: Adrenal glands are within normal limits. Kidneys are within normal limits.  No hydronephrosis. Bladder is within normal limits. Stomach/Bowel: Stomach is within normal limits. No evidence of bowel obstruction. Normal appendix (series 2/image 90). No colonic wall thickening or inflammatory changes. Vascular/Lymphatic: No evidence of abdominal aortic aneurysm. No suspicious abdominopelvic lymphadenopathy. Reproductive: Prostate is unremarkable. Other: Moderate abdominal ascites, partially loculated. This was non FDG avid on prior PET. Moderate body wall edema, new. Musculoskeletal: No focal osseous lesions. IMPRESSION: New patchy/ground-glass opacities in the right upper lobe, raising concern for atypical infection/pneumonia. Moderate left pleural effusion with chronic pleural thickening, unchanged. Suspected hepatic metastases, ill-defined/poorly evaluated, with superimposed left hepatic lobe scarring/atrophy. This appearance is similar to the prior. Stable lytic metastasis in the posterior T3 vertebral body. Moderate abdominal ascites, unchanged. Moderate body wall edema, new. Electronically Signed   By: Charline Bills M.D.   On: 09/24/2023 20:03   US Paracentesis Result Date: 09/24/2023 INDICATION: History of metastatic lung cancer with recurrent malignant ascites. Request is for therapeutic paracentesis EXAM: ULTRASOUND GUIDED THERAPEUTIC  PARACENTESIS MEDICATIONS: Lidocaine 1% 10 mL COMPLICATIONS: None immediate. PROCEDURE: Informed written consent was obtained from the patient after a discussion of the risks, benefits and alternatives to treatment. A timeout was performed prior to the initiation of the procedure. Initial ultrasound scanning demonstrates a small amount of ascites within the right lower abdominal quadrant. The right lower abdomen was prepped and draped in the usual sterile fashion. 1% lidocaine was used for local anesthesia. Following this, a 19 gauge, 7-cm, Yueh catheter was introduced. An ultrasound image was saved for documentation purposes. The paracentesis was performed. The catheter was removed and a dressing  was applied. The patient tolerated the procedure well without immediate post procedural complication. Patient received post-procedure intravenous albumin; see nursing notes for details. FINDINGS: A total of approximately 2.7 L of straw-colored fluid was removed. Samples were sent to the laboratory as requested by the clinical team. IMPRESSION: Successful ultrasound-guided therapeutic paracentesis yielding 2.7 liters of peritoneal fluid. Performed by Anders Grant NP Electronically Signed   By: Judie Petit.  Shick M.D.   On: 09/24/2023 16:04   US THORACENTESIS ASP PLEURAL SPACE W/IMG GUIDE Result Date: 09/23/2023 INDICATION: Patient with a history of lung cancer with recurrent pleural effusions. Interventional radiology asked to perform a therapeutic thoracentesis. EXAM: ULTRASOUND GUIDED THORACENTESIS MEDICATIONS: 1% lidocaine 15 mL COMPLICATIONS: None immediate. PROCEDURE: An ultrasound guided thoracentesis was thoroughly discussed with the patient and questions answered. The benefits, risks, alternatives and complications were also discussed. The patient understands and wishes to proceed with the procedure. Written consent was obtained. Ultrasound was performed to localize and mark an adequate pocket of fluid in the left  chest. The area was then prepped and draped in the normal sterile fashion. 1% Lidocaine was used for local anesthesia. Under ultrasound guidance a 6 Fr Safe-T-Centesis catheter was introduced. Thoracentesis was performed. The catheter was removed and a dressing applied. FINDINGS: A total of approximately 450 mL of dark red fluid was removed. IMPRESSION: Successful ultrasound guided left thoracentesis yielding 450 mL of pleural fluid. Procedure performed by Alwyn Ren NP Electronically Signed   By: Judie Petit.  Shick M.D.   On: 09/23/2023 11:02   DG Chest Port 1 View Result Date: 09/23/2023 CLINICAL DATA:  Status post left thoracentesis. EXAM: PORTABLE CHEST 1 VIEW COMPARISON:  September 21, 2023. FINDINGS: No pneumothorax is noted status post left thoracentesis. Left pleural effusion is slightly decreased compared to prior exam. IMPRESSION: No pneumothorax status post left thoracentesis. Electronically Signed   By: Lupita Raider M.D.   On: 09/23/2023 10:54   DG Chest 2 View Result Date: 09/21/2023 CLINICAL DATA:  Suspected sepsis. Lung and liver cancer per patient. Vomiting. EXAM: CHEST - 2 VIEW COMPARISON:  Chest radiographs 09/09/2023 and 09/06/2023. Chest CT 07/16/2022. FINDINGS: Right IJ Port-A-Cath extends to the level of the superior cavoatrial junction, stable. The visualized heart size and mediastinal contours are stable. A large laterally loculated left pleural effusion appears slightly increased compared with the most recent prior study. Associated chronic compressive left lung atelectasis. No evidence of right-sided pleural effusion or pneumothorax. Mild right basilar atelectasis, unchanged. The bones appear unchanged. IMPRESSION: Slight increase in size of a large laterally loculated left pleural effusion with associated chronic compressive left lung atelectasis. No other significant changes. Electronically Signed   By: Carey Bullocks M.D.   On: 09/21/2023 14:19   US Paracentesis Result Date:  09/17/2023 INDICATION: Metastatic lung cancer and malignant ascites. Request for therapeutic paracentesis. EXAM: ULTRASOUND GUIDED PARACENTESIS MEDICATIONS: 1% lidocaine 10 mL COMPLICATIONS: None immediate. PROCEDURE: Informed written consent was obtained from the patient after a discussion of the risks, benefits and alternatives to treatment. A timeout was performed prior to the initiation of the procedure. Initial ultrasound scanning demonstrates a large amount of ascites within the right lateral abdomen. The right lateral abdomen was prepped and draped in the usual sterile fashion. 1% lidocaine was used for local anesthesia. Following this, a 19 gauge, 7-cm, Yueh catheter was introduced. An ultrasound image was saved for documentation purposes. The paracentesis was performed. The catheter was removed and a dressing was applied. The patient tolerated the procedure well without immediate  post procedural complication. FINDINGS: A total of approximately 5.6 L of clear yellow fluid was removed. IMPRESSION: Successful ultrasound-guided paracentesis yielding 5.6 liters of peritoneal fluid. Electronically Signed   By: Gilmer Mor D.O.   On: 09/17/2023 12:03   DG Chest Port 1 View Result Date: 09/09/2023 CLINICAL DATA:  Left lower lobe lung cancer. Status post left thoracentesis. EXAM: PORTABLE CHEST 1 VIEW COMPARISON:  09/06/2023 FINDINGS: Power injectable Port-A-Cath tip: Cavoatrial junction. Minimal increase in aeration in the left lung, large left pleural effusion observed. No pneumothorax or radiographic evidence of procedure complication. The right lung remains clear. Heart size within normal limits although the left apical border remains obscured. IMPRESSION: 1. Minimal increase in aeration in the left lung, large left pleural effusion observed. No pneumothorax or radiographic evidence of procedure complication. Electronically Signed   By: Gaylyn Rong M.D.   On: 09/09/2023 14:38   US THORACENTESIS  ASP PLEURAL SPACE W/IMG GUIDE Result Date: 09/09/2023 INDICATION: Patient with history of left lung cancer with recurrent left-sided pleural effusion. Request is for therapeutic thoracentesis EXAM: ULTRASOUND GUIDED THERAPEUTIC LEFT-SIDED THORACENTESIS MEDICATIONS: Lidocaine 1% 10 mL COMPLICATIONS: None immediate. PROCEDURE: An ultrasound guided thoracentesis was thoroughly discussed with the patient and questions answered. The benefits, risks, alternatives and complications were also discussed. The patient understands and wishes to proceed with the procedure. Written consent was obtained. Ultrasound was performed to localize and mark an adequate pocket of fluid in the left chest. The area was then prepped and draped in the normal sterile fashion. 1% Lidocaine was used for local anesthesia. Under ultrasound guidance a 6 Fr Safe-T-Centesis catheter was introduced. Thoracentesis was performed. The catheter was removed and a dressing applied. FINDINGS: A total of approximately 450 mL of reddish brown fluid was removed. Samples were sent to the laboratory as requested by the clinical team. IMPRESSION: Successful ultrasound guided therapeutic left-sided thoracentesis yielding 450 mL of pleural fluid. Performed by Anders Grant NP Electronically Signed   By: Judie Petit.  Shick M.D.   On: 09/09/2023 13:58   DG Chest 2 View Result Date: 09/06/2023 CLINICAL DATA:  Cough and shortness of breath. History of lung cancer. EXAM: CHEST - 2 VIEW COMPARISON:  Chest x-ray dated August 13, 2023. FINDINGS: Unchanged right chest wall port catheter. Stable cardiomediastinal silhouette with normal heart size. Unchanged chronic loculated large left pleural effusion with atelectasis in the lingula and left lower lobe. Slightly increased interstitial thickening in the right lung. No pneumothorax. No acute osseous abnormality. IMPRESSION: 1. Slightly increased interstitial thickening in the right lung, which may reflect atypical infection  or pulmonary edema. 2. Unchanged chronic loculated large left pleural effusion. Electronically Signed   By: Obie Dredge M.D.   On: 09/06/2023 17:11   US Paracentesis Result Date: 09/06/2023 INDICATION: malignant ascites EXAM: ULTRASOUND GUIDED  PARACENTESIS MEDICATIONS: None. COMPLICATIONS: None immediate. PROCEDURE: Informed written consent was obtained from the patient after a discussion of the risks, benefits and alternatives to treatment. A timeout was performed prior to the initiation of the procedure. Initial ultrasound scanning demonstrates a large amount of ascites within the right lower abdominal quadrant. The right lower abdomen was prepped and draped in the usual sterile fashion. 1% lidocaine was used for local anesthesia. Following this, a 19 gauge, 7-cm, Yueh catheter was introduced. An ultrasound image was saved for documentation purposes. The paracentesis was performed. The catheter was removed and a dressing was applied. The patient tolerated the procedure well without immediate post procedural complication. FINDINGS: A total of approximately 4.8  L of clear, straw-colored fluid was removed. IMPRESSION: Successful ultrasound-guided paracentesis yielding 4.8 liters of peritoneal fluid. Electronically Signed   By: Olive Bass M.D.   On: 09/06/2023 16:39    Microbiology: Recent Results (from the past 240 hours)  Culture, blood (Routine x 2)     Status: None   Collection Time: 09/21/23  2:19 PM   Specimen: BLOOD  Result Value Ref Range Status   Specimen Description BLOOD PORTA CATH  Final   Special Requests   Final    BOTTLES DRAWN AEROBIC AND ANAEROBIC Blood Culture results may not be optimal due to an inadequate volume of blood received in culture bottles   Culture   Final    NO GROWTH 5 DAYS Performed at [facility], [address] [place], [place] [address]    Report Status 09/26/2023 FINAL  Final  Culture, blood (Routine x 2)     Status: None   Collection  Time: 09/21/23  2:19 PM   Specimen: BLOOD LEFT ARM  Result Value Ref Range Status   Specimen Description BLOOD LEFT ARM  Final   Special Requests   Final    BOTTLES DRAWN AEROBIC AND ANAEROBIC Blood Culture adequate volume   Culture   Final    NO GROWTH 5 DAYS Performed at [facility], [address] [place], [place] [address]    Report Status 09/26/2023 FINAL  Final     Labs: CBC: Recent Labs  Lab 09/21/23 1407 09/22/23 0511 09/23/23 0500 09/24/23 1103 09/25/23 0415 09/26/23 0455  WBC 7.4 7.7 7.7 15.1* 14.5* 13.8*  NEUTROABS 4.7  --   --   --   --   --   HGB 12.2* 9.2* 8.9* 8.4* 8.3* 8.9*  HCT 34.8* 25.8* 24.3* 23.7* 23.7* 26.0*  MCV 92.8 89.9 90.0 92.9 92.9 97.0  PLT 59* 53* 61* 87* 80* 69*   Basic Metabolic Panel: Recent Labs  Lab 09/22/23 0511 09/22/23 1314 09/23/23 0500 09/23/23 1342 09/24/23 1103 09/24/23 1853 09/25/23 0411 09/25/23 1100 09/25/23 2333 09/26/23 0455 09/26/23 1246  NA 119*   < > 121*   < > 133*   < > 134* 136 133* 133* 132*  K 3.6   < > 2.9*   < > 3.5   < > 3.7 3.3* 3.9 4.1 3.6  CL 90*   < > 87*   < > 100   < > 103 108 100 102 101  CO2 21*   < > 21*   < > 25   < > 25 24 27 28 26   GLUCOSE 89   < > 123*   < > 147*   < > 147* 135* 116* 91 126*  BUN 10   < > 12   < > 14   < > 12 9 10 10 11   CREATININE 0.82   < > 0.78   < > 0.74   < > 0.74 0.57* 0.65 0.84 0.68  CALCIUM 7.4*   < > 7.5*   < > 8.2*   < > 8.3* 7.5* 7.9* 7.8* 7.8*  MG 1.3*  --  1.5*  --  2.0  --  1.7  --   --   --  1.6*  PHOS 3.3  --  3.9  --  2.5  --  2.8  --   --   --   --    < > = values in this interval not displayed.   Liver Function Tests: Recent Labs  Lab 09/23/23 0500 09/23/23 1356 09/24/23 1103 09/25/23 0411 09/26/23 0455  AST 48* 51* 52* 52* 70*  ALT 63* 62* 56* 54* 74*  ALKPHOS 227* 227* 198* 187* 203*  BILITOT 1.5* 1.5* 1.0 1.1 1.1  PROT 5.0* 5.1* 4.9* 4.3* 4.3*  ALBUMIN 2.8* 3.0* 3.0* 2.8* 2.4*   No results for input(s): "LIPASE", "AMYLASE" in  the last 168 hours. No results for input(s): "AMMONIA" in the last 168 hours. Cardiac Enzymes: No results for input(s): "CKTOTAL", "CKMB", "CKMBINDEX", "TROPONINI" in the last 168 hours. BNP (last 3 results) No results for input(s): "BNP" in the last 8760 hours. CBG: Recent Labs  Lab 09/25/23 2029  GLUCAP 111*    Time spent: 35 minutes  Signed:  Gillis Santa  [facility] 09/26/2023 1:57 PM

## 2023-09-27 ENCOUNTER — Inpatient Hospital Stay: Payer: 59

## 2023-09-27 ENCOUNTER — Inpatient Hospital Stay (HOSPITAL_BASED_OUTPATIENT_CLINIC_OR_DEPARTMENT_OTHER): Payer: 59 | Admitting: Hospice and Palliative Medicine

## 2023-09-27 ENCOUNTER — Inpatient Hospital Stay: Payer: 59 | Admitting: Hospice and Palliative Medicine

## 2023-09-27 ENCOUNTER — Inpatient Hospital Stay: Payer: 59 | Admitting: Internal Medicine

## 2023-09-27 DIAGNOSIS — G4700 Insomnia, unspecified: Secondary | ICD-10-CM | POA: Diagnosis not present

## 2023-09-27 DIAGNOSIS — Z515 Encounter for palliative care: Secondary | ICD-10-CM | POA: Diagnosis not present

## 2023-09-27 DIAGNOSIS — C3490 Malignant neoplasm of unspecified part of unspecified bronchus or lung: Secondary | ICD-10-CM | POA: Diagnosis not present

## 2023-09-27 NOTE — Progress Notes (Signed)
Virtual Visit via Telephone Note  I connected with [name] on 09/27/23 at  1:00 PM EST by telephone and verified that I am speaking with the correct person using two identifiers.  Location: Patient: Home Provider: Clinic   I discussed the limitations, risks, security and privacy concerns of performing an evaluation and management service by telephone and the availability of in person appointments. I also discussed with the patient that there may be a patient responsible charge related to this service. The patient expressed understanding and agreed to proceed.   History of Present Illness: [name] is a 42 y.o. male with multiple medical problems including stage IV adenocarcinoma of the lung status post multiple previous lines of systemic treatment, who was admitted to hospital on 09/21/2023 with syncope.  Found to have hyponatremia, anasarca.  Palliative care was consulted to address goals.    Observations/Objective: Patient discharged home.  Spoke with patient and wife.  Patient reportedly is having a good day today.  Did have trouble sleeping last night.  Patient eventually took trazodone 50 mg and then felt groggy this morning.  Discussed him taking a half a dose (25 mg) nightly as needed.  Wife had questions about hospice involvement.  Patient is coming back to the clinic next week to discuss goals but remains committed to pursuing treatment if options are available.  Also discussed [facility] and [facility] for support of their 83 year old daughter.   Assessment and Plan: Stage IV non-small cell lung cancer -progression despite multiple previous lines of treatment.  Patient has follow-up next week with Dr. Donneta Romberg  Insomnia -recommended half a tablet (25 mg) of trazodone as needed  Follow Up Instructions: Next week   I discussed the assessment and treatment plan with the patient. The patient was provided an opportunity to ask questions and all were answered.  The patient agreed with the plan and demonstrated an understanding of the instructions.   The patient was advised to call back or seek an in-person evaluation if the symptoms worsen or if the condition fails to improve as anticipated.  I provided 10 minutes of non-face-to-face time during this encounter.   Malachy Moan, NP

## 2023-09-30 ENCOUNTER — Other Ambulatory Visit: Payer: Self-pay | Admitting: *Deleted

## 2023-09-30 ENCOUNTER — Inpatient Hospital Stay: Payer: 59

## 2023-09-30 VITALS — BP 110/73 | HR 126 | Temp 96.6°F | Resp 18

## 2023-09-30 DIAGNOSIS — C3490 Malignant neoplasm of unspecified part of unspecified bronchus or lung: Secondary | ICD-10-CM

## 2023-09-30 DIAGNOSIS — Z95828 Presence of other vascular implants and grafts: Secondary | ICD-10-CM

## 2023-09-30 DIAGNOSIS — R112 Nausea with vomiting, unspecified: Secondary | ICD-10-CM

## 2023-09-30 DIAGNOSIS — E860 Dehydration: Secondary | ICD-10-CM

## 2023-09-30 DIAGNOSIS — C3432 Malignant neoplasm of lower lobe, left bronchus or lung: Secondary | ICD-10-CM | POA: Diagnosis not present

## 2023-09-30 LAB — BASIC METABOLIC PANEL - CANCER CENTER ONLY
Anion gap: 9 (ref 5–15)
BUN: 10 mg/dL (ref 6–20)
CO2: 27 mmol/L (ref 22–32)
Calcium: 7.5 mg/dL — ABNORMAL LOW (ref 8.9–10.3)
Chloride: 95 mmol/L — ABNORMAL LOW (ref 98–111)
Creatinine: 0.72 mg/dL (ref 0.61–1.24)
GFR, Estimated: >60 mL/min (ref >60)
Glucose, Bld: 137 mg/dL — ABNORMAL HIGH (ref 70–99)
Potassium: 3.7 mmol/L (ref 3.5–5.1)
Sodium: 131 mmol/L — ABNORMAL LOW (ref 135–145)

## 2023-09-30 LAB — MAGNESIUM: Magnesium: 1.3 mg/dL — ABNORMAL LOW (ref 1.7–2.4)

## 2023-09-30 MED ORDER — SODIUM CHLORIDE 0.9 % IV SOLN
INTRAVENOUS | Status: DC
Start: 2023-09-30 — End: 2023-09-30
  Filled 2023-09-30 (×2): qty 250

## 2023-09-30 MED ORDER — ONDANSETRON HCL 4 MG/2ML IJ SOLN
8.0000 mg | Freq: Once | INTRAMUSCULAR | Status: AC
  Administered 2023-09-30: 8 mg via INTRAVENOUS
  Filled 2023-09-30: qty 4

## 2023-09-30 MED ORDER — MAGNESIUM SULFATE 2 GM/50ML IV SOLN
2.0000 g | Freq: Once | INTRAVENOUS | Status: AC
  Administered 2023-09-30: 2 g via INTRAVENOUS
  Filled 2023-09-30: qty 50

## 2023-09-30 MED ORDER — HEPARIN SOD (PORK) LOCK FLUSH 100 UNIT/ML IV SOLN
500.0000 [IU] | Freq: Once | INTRAVENOUS | Status: AC
  Administered 2023-09-30: 500 [IU]
  Filled 2023-09-30: qty 5

## 2023-09-30 MED ORDER — SODIUM CHLORIDE 0.9% FLUSH
10.0000 mL | Freq: Once | INTRAVENOUS | Status: AC
  Administered 2023-09-30: 10 mL via INTRAVENOUS
  Filled 2023-09-30: qty 10

## 2023-09-30 NOTE — Progress Notes (Signed)
Spoke with Angie at [facility]. Tissue samples received from off site facility and HER2 testing will be initiated. There is enough tissue in samples to process for HER2. Per Angie, can expect results in a couple of weeks.

## 2023-10-01 ENCOUNTER — Other Ambulatory Visit: Payer: Self-pay | Admitting: *Deleted

## 2023-10-01 DIAGNOSIS — C3490 Malignant neoplasm of unspecified part of unspecified bronchus or lung: Secondary | ICD-10-CM

## 2023-10-02 ENCOUNTER — Inpatient Hospital Stay: Payer: 59

## 2023-10-02 ENCOUNTER — Inpatient Hospital Stay (HOSPITAL_BASED_OUTPATIENT_CLINIC_OR_DEPARTMENT_OTHER): Payer: 59 | Admitting: Internal Medicine

## 2023-10-02 ENCOUNTER — Other Ambulatory Visit: Payer: Self-pay | Admitting: Nephrology

## 2023-10-02 ENCOUNTER — Encounter: Payer: Self-pay | Admitting: Internal Medicine

## 2023-10-02 ENCOUNTER — Other Ambulatory Visit: Payer: Self-pay

## 2023-10-02 ENCOUNTER — Other Ambulatory Visit: Payer: Self-pay | Admitting: Internal Medicine

## 2023-10-02 ENCOUNTER — Inpatient Hospital Stay (HOSPITAL_BASED_OUTPATIENT_CLINIC_OR_DEPARTMENT_OTHER): Payer: 59 | Admitting: Hospice and Palliative Medicine

## 2023-10-02 ENCOUNTER — Ambulatory Visit
Admission: RE | Admit: 2023-10-02 | Discharge: 2023-10-02 | Disposition: A | Discharge status: Home / Self Care | Payer: 59 | Source: Ambulatory Visit | Attending: Internal Medicine | Admitting: Internal Medicine

## 2023-10-02 VITALS — BP 103/82 | HR 112

## 2023-10-02 VITALS — BP 113/72 | HR 101 | Temp 95.5°F | Resp 18 | Ht 70.0 in | Wt 211.0 lb

## 2023-10-02 DIAGNOSIS — R180 Malignant ascites: Secondary | ICD-10-CM | POA: Insufficient documentation

## 2023-10-02 DIAGNOSIS — C3490 Malignant neoplasm of unspecified part of unspecified bronchus or lung: Secondary | ICD-10-CM

## 2023-10-02 DIAGNOSIS — E871 Hypo-osmolality and hyponatremia: Secondary | ICD-10-CM

## 2023-10-02 DIAGNOSIS — C3432 Malignant neoplasm of lower lobe, left bronchus or lung: Secondary | ICD-10-CM

## 2023-10-02 DIAGNOSIS — Z95828 Presence of other vascular implants and grafts: Secondary | ICD-10-CM

## 2023-10-02 DIAGNOSIS — Z515 Encounter for palliative care: Secondary | ICD-10-CM | POA: Diagnosis not present

## 2023-10-02 DIAGNOSIS — R112 Nausea with vomiting, unspecified: Secondary | ICD-10-CM

## 2023-10-02 DIAGNOSIS — E860 Dehydration: Secondary | ICD-10-CM

## 2023-10-02 LAB — CMP (CANCER CENTER ONLY)
ALT: 108 U/L — ABNORMAL HIGH (ref 0–44)
AST: 103 U/L — ABNORMAL HIGH (ref 15–41)
Albumin: 2.2 g/dL — ABNORMAL LOW (ref 3.5–5.0)
Alkaline Phosphatase: 310 U/L — ABNORMAL HIGH (ref 38–126)
Anion gap: 7 (ref 5–15)
BUN: 10 mg/dL (ref 6–20)
CO2: 26 mmol/L (ref 22–32)
Calcium: 7.7 mg/dL — ABNORMAL LOW (ref 8.9–10.3)
Chloride: 94 mmol/L — ABNORMAL LOW (ref 98–111)
Creatinine: 0.8 mg/dL (ref 0.61–1.24)
GFR, Estimated: >60 mL/min (ref >60)
Glucose, Bld: 117 mg/dL — ABNORMAL HIGH (ref 70–99)
Potassium: 3.8 mmol/L (ref 3.5–5.1)
Sodium: 127 mmol/L — ABNORMAL LOW (ref 135–145)
Total Bilirubin: 1.1 mg/dL (ref <1.2)
Total Protein: 5 g/dL — ABNORMAL LOW (ref 6.5–8.1)

## 2023-10-02 LAB — CBC WITH DIFFERENTIAL (CANCER CENTER ONLY)
Abs Immature Granulocytes: 0.5 10*3/uL — ABNORMAL HIGH (ref 0.00–0.07)
Basophils Absolute: 0.1 10*3/uL (ref 0.0–0.1)
Basophils Relative: 1 %
Eosinophils Absolute: 0 10*3/uL (ref 0.0–0.5)
Eosinophils Relative: 0 %
HCT: 34 % — ABNORMAL LOW (ref 39.0–52.0)
Hemoglobin: 11.4 g/dL — ABNORMAL LOW (ref 13.0–17.0)
Immature Granulocytes: 3 %
Lymphocytes Relative: 10 %
Lymphs Abs: 1.5 10*3/uL (ref 0.7–4.0)
MCH: 32.5 pg (ref 26.0–34.0)
MCHC: 33.5 g/dL (ref 30.0–36.0)
MCV: 96.9 fL (ref 80.0–100.0)
Monocytes Absolute: 2.7 10*3/uL — ABNORMAL HIGH (ref 0.1–1.0)
Monocytes Relative: 18 %
Neutro Abs: 10.1 10*3/uL — ABNORMAL HIGH (ref 1.7–7.7)
Neutrophils Relative %: 68 %
Platelet Count: 144 10*3/uL — ABNORMAL LOW (ref 150–400)
RBC: 3.51 MIL/uL — ABNORMAL LOW (ref 4.22–5.81)
RDW: 19.2 % — ABNORMAL HIGH (ref 11.5–15.5)
WBC Count: 15 10*3/uL — ABNORMAL HIGH (ref 4.0–10.5)
nRBC: 0 % (ref 0.0–0.2)

## 2023-10-02 LAB — MAGNESIUM: Magnesium: 1.7 mg/dL (ref 1.7–2.4)

## 2023-10-02 MED ORDER — SODIUM CHLORIDE 0.9 % IV SOLN
INTRAVENOUS | Status: DC
  Filled 2023-10-02 (×2): qty 250

## 2023-10-02 MED ORDER — HEPARIN SOD (PORK) LOCK FLUSH 100 UNIT/ML IV SOLN
500.0000 [IU] | Freq: Once | INTRAVENOUS | Status: AC
  Administered 2023-10-02: 500 [IU]
  Filled 2023-10-02: qty 5

## 2023-10-02 MED ORDER — SODIUM CHLORIDE 0.9% FLUSH
10.0000 mL | Freq: Once | INTRAVENOUS | Status: AC
Start: 2023-10-02 — End: 2023-10-02
  Administered 2023-10-02: 10 mL via INTRAVENOUS
  Filled 2023-10-02: qty 10

## 2023-10-02 MED ORDER — LIDOCAINE HCL (PF) 1 % IJ SOLN
10.0000 mL | Freq: Once | INTRAMUSCULAR | Status: AC
  Administered 2023-10-02: 10 mL via INTRADERMAL
  Filled 2023-10-02: qty 10

## 2023-10-02 NOTE — Progress Notes (Signed)
[name] CONSULT NOTE  Patient Care Team: Pcp, No as PCP - General Nathan Buff, RN as Oncology Nurse Navigator Nathan Coder, MD as Consulting Physician (Internal Medicine)  CHIEF COMPLAINTS/PURPOSE OF CONSULTATION: Lung cancer  #  Oncology History Overview Note  IMPRESSION: 1. Patchy nodular fat stranding throughout the anterior left upper quadrant peritoneal fat, nonspecific, cannot exclude peritoneal carcinomatosis. Dedicated CT abdomen/pelvis with oral and IV contrast recommended for further evaluation. 2. Dense patchy consolidation replacing much of the left lower lung lobe, appearing masslike in the superior segment left lower lobe, with associated bulging of the left major fissure and associated left lower lobe volume loss. Fine nodularity throughout both lungs with an upper lobe predominance. Asymmetric left upper lobe interlobular septal thickening. These findings are indeterminate, with differential including multilobar pneumonia, sarcoidosis or a neoplastic process. The persistence on radiographs back to 01/20/2021 despite antibiotic therapy make sarcoidosis or a neoplastic process more likely. Pulmonology consultation suggested for consideration of bronchoscopic evaluation. 3. Small dependent left pleural effusion. 4. Mild mediastinal lymphadenopathy, nonspecific. 5. Subacute healing lateral right sixth rib fracture.   DIAGNOSIS:  A. LUNG, LEFT LOWER LOBE; ENB-ASSISTED BIOPSY:  - NON-SMALL CELL CARCINOMA, FAVOR ADENOCARCINOMA.  - FOREIGN MATERIAL SUGGESTIVE OF ASPIRATION.   # EGFR MUTATED: 64 del- June 28th, 2022- [name]. [limited]  # Asymptomatic multiple brain mets subcentimeter asymptomatic -JUNE 29th, 2023- Numerous metastatic lesions in the supratentorial brain-increase in number also conspicuity compared to January 2023.  Pre-SBRT MRI brain AUG 18th, 2023-  Eighteen small enhancing brain metastases (punctate to 11 mm); were all  present on 03/28/2021-s/p SBRT SEP 2023- [[place]]; SRS/ radiation treatment for brain metastasis He finished treatment on 11-26-22.   # SEP-OCT-worsening left-sided pleural effusion-status post paracentesis; exudative; Cytology negative- ?  Progressive disease.  # NOV 2023- JAN 2024 CARBO-TAXOL-BEV+ATEZO x4 cyles- BEV+ATEZO- until APRIL 2024  # STAGE IV- [JUNE 2022] Left lower lobe lung cancer-adenocarcinoma the lung;  EGFR- 19 del POSITIVE.  While on osimertinib- NOV 1st, 2023- PET scan- shows progressive disease in liver. Liquid Biopsy/Guardant testing-positive for EGFR mutation; otherwise Negative for any novel mutations.  Repeat liver Biopsy positive for adenocarcinoma. NEGATIVE for MET amplification.  S/p second opinion at [facility]- [name]. status post 4 cycles of carbo Taxol-Atezo+Bev- JAN 25th, 2024-PET- Interval response to therapy with resolution of previously demonstrated extensive hypermetabolic activity throughout the liver. PARIL 2024- ? Partial response, but clinically worse/rising LFTs  # MAY 6th, Brain MRI-progressive multiple brain metastases  # MAY 16th, 2024- Alimta+Avastin #1    Cancer of lower lobe of left lung (HCC)  03/23/2021 Initial Diagnosis   Cancer of lower lobe of left lung (HCC)   03/29/2021 Cancer Staging   Staging form: Lung, AJCC 8th Edition - Clinical: Stage IVB (cT3, cN3, pM1c) - Signed by Nathan Coder, MD on 03/29/2021   03/30/2021 - 03/30/2021 Chemotherapy   Patient is on Treatment Plan : LUNG NSCLC Pemetrexed (Alimta) / Carboplatin q21d x 1 cycles     08/17/2022 - 01/15/2023 Chemotherapy   Patient is on Treatment Plan : LUNG Atezolizumab + Bevacizumab + Carboplatin + Paclitaxel q21d Induction x 4 cycles / Atezolizumab + Bevacizumab q21d Maintenance     02/28/2023 - 07/02/2023 Chemotherapy   Patient is on Treatment Plan : LUNG NON-SMALL CELL Bevacizumab + Pemetrexed q21d     08/07/2023 -  Chemotherapy   Patient is on Treatment Plan : LUNG NSCLC  Pemetrexed + Carboplatin + Amivantamab q21d x 4 Cycles / Pemetrexed + Amivantamab  q21d      HISTORY OF PRESENTING ILLNESS: Ambulating independently.  Accompanied by his wife.  [name] 42 y.o.  male patient with stage IV lung cancer adenocarcinoma-brain mets [synchronous] bone mets EGFR mutated -most recently on with chemotherapy status post 2 cycles of carboplatin-Alimta- Ami is here for a follow up.  Patient was recently admitted to hospital-on 12/07- with syncope.  Found to have hyponatremia, anasarca.  Patient needed paracentesis thoracentesis-correction of the sodium at discharge.  Patient was unable to keep up his appointment at [facility] given the recent hospitalization.  Patient continues to have ongoing nausea with vomiting.  Appetite is poor.  Noted to have abdominal fluid buildup.  Patient currently taking trazodone half a pill 25 mg. No worsening headaches.  Review of Systems  Constitutional:  Positive for malaise/fatigue. Negative for chills, diaphoresis and fever.  HENT:  Negative for nosebleeds and sore throat.   Eyes:  Negative for double vision.  Respiratory:  Positive for cough. Negative for wheezing.   Cardiovascular:  Negative for chest pain, palpitations, orthopnea and leg swelling.  Gastrointestinal:  Positive for heartburn and nausea. Negative for abdominal pain, blood in stool, diarrhea and melena.  Genitourinary:  Negative for dysuria, frequency and urgency.  Musculoskeletal:  Negative for back pain and joint pain.  Neurological:  Negative for tingling and focal weakness.  Endo/Heme/Allergies:  Does not bruise/bleed easily.  Psychiatric/Behavioral:  Negative for depression. The patient is not nervous/anxious and does not have insomnia.      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    SURGICAL HISTORY: Past Surgical  History:  Procedure Laterality Date   FRACTURE SURGERY     broke femur when he was 6 years   PORTA CATH INSERTION N/A 03/28/2021   Procedure: PORTA CATH INSERTION;  Surgeon: Renford Dills, MD;  Location: [facility] INVASIVE CV LAB;  Service: Cardiovascular;  Laterality: N/A;   VIDEO BRONCHOSCOPY WITH ENDOBRONCHIAL NAVIGATION N/A 03/15/2021   Procedure: VIDEO BRONCHOSCOPY WITH ENDOBRONCHIAL NAVIGATION;  Surgeon: Vida Rigger, MD;  Location: [facility] ORS;  Service: Thoracic;  Laterality: N/A;   VIDEO BRONCHOSCOPY WITH ENDOBRONCHIAL ULTRASOUND N/A 03/15/2021   Procedure: VIDEO BRONCHOSCOPY WITH ENDOBRONCHIAL ULTRASOUND;  Surgeon: Vida Rigger, MD;  Location: [facility] ORS;  Service: Thoracic;  Laterality: N/A;    SOCIAL HISTORY: Social History   Socioeconomic History   Marital status: Married    Spouse name: Darl Pikes   Number of children: 2   Years of education: Not on file   Highest education level: Not on file  Occupational History   Not on file  Tobacco Use   Smoking status: Never   Smokeless tobacco: Former    Types: Snuff, Chew  Vaping Use   Vaping status: Never Used  Substance and Sexual Activity   Alcohol use: Never   Drug use: Never   Sexual activity: Yes  Other Topics Concern   Not on file  Social History Narrative   [place]; with wife; 2 daughters[12 and 22]; never smoked; rare alcohol. 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 Dog and cat.   Social Drivers of Corporate investment banker Strain: Low Risk  (05/10/2022)   Overall Financial Resource Strain (CARDIA)    Difficulty of Paying Living Expenses: Not hard at all  Food Insecurity: No Food Insecurity (09/23/2023)   Hunger Vital  Sign    Worried About Programme researcher, broadcasting/film/video in the Last Year: Never true    Ran Out of Food in the Last Year: Never true  Transportation Needs: No Transportation Needs (09/23/2023)   PRAPARE - Administrator, Civil Service (Medical): No    Lack of Transportation (Non-Medical): No  Physical Activity:  Not on file  Stress: Not on file  Social Connections: Socially Integrated (05/10/2022)   Social Connection and Isolation Panel [NHANES]    Frequency of Communication with Friends and Family: More than three times a week    Frequency of Social Gatherings with Friends and Family: More than three times a week    Attends Religious Services: More than 4 times per year    Active Member of [employer] or Organizations: Yes    Attends Banker Meetings: More than 4 times per year    Marital Status: Married  Catering manager Violence: Unknown (09/23/2023)   Humiliation, Afraid, Rape, and Kick questionnaire    Fear of Current or Ex-Partner: Not on file    Emotionally Abused: No    Physically Abused: No    Sexually Abused: No    FAMILY HISTORY: Family History  Problem Relation Age of Onset   High Cholesterol Mother    Hypertension Father    Lung cancer Father    Skin cancer Father     ALLERGIES:  is allergic to codeine.  MEDICATIONS:  Current Outpatient Medications  Medication Sig Dispense Refill   albuterol (VENTOLIN HFA) 108 (90 Base) MCG/ACT inhaler Inhale 2 puffs into the lungs every 6 (six) hours as needed for wheezing or shortness of breath. 8 g 2   calcium carbonate (TUMS EX) 750 MG chewable tablet Chew 1 tablet by mouth daily.     folic acid (FOLVITE) 1 MG tablet TAKE 1 TABLET BY MOUTH EVERY DAY 90 tablet 1   furosemide (LASIX) 20 MG tablet Take 2 tablets (40 mg total) by mouth daily. 30 tablet 5   levofloxacin (LEVAQUIN) 750 MG tablet Take 1 tablet (750 mg total) by mouth daily for 7 days. 7 tablet 0   lidocaine-prilocaine (EMLA) cream Apply 1 Application topically as needed. 30 g 0   metoprolol tartrate (LOPRESSOR) 25 MG tablet Take 1 tablet (25 mg total) by mouth 2 (two) times daily. 30 tablet 11   ondansetron (ZOFRAN-ODT) 4 MG disintegrating tablet Take 1 tablet (4 mg total) by mouth every 8 (eight) hours as needed for nausea or vomiting. 20 tablet 1   pantoprazole  (PROTONIX) 40 MG tablet Take 1 tablet (40 mg total) by mouth daily. 90 tablet 3   triamcinolone ointment (KENALOG) 0.5 % Apply 1 Application topically 2 (two) times daily. 30 g 1   lactulose (CHRONULAC) 10 GM/15ML solution Take 30 mLs (20 g total) by mouth 2 (two) times daily as needed for mild constipation or moderate constipation (Titrate to 1-2 BM per day). (Patient not taking: Reported on 10/02/2023) 236 mL 0   methylphenidate (RITALIN) 5 MG tablet Take 1 tablet (5 mg total) by mouth 2 (two) times daily as needed (fatigue). (Patient not taking: Reported on 10/02/2023) 60 tablet 0   metoCLOPramide (REGLAN) 10 MG tablet Take 1 tablet (10 mg total) by mouth every 8 (eight) hours as needed for nausea. (Patient not taking: Reported on 10/02/2023) 30 tablet 1   montelukast (SINGULAIR) 10 MG tablet TAKE 1 TABLET BY MOUTH EVERYDAY AT BEDTIME (Patient not taking: Reported on 10/02/2023) 90 tablet 1  prochlorperazine (COMPAZINE) 10 MG tablet Take 1 tablet (10 mg total) by mouth every 6 (six) hours as needed for nausea or vomiting. (Patient not taking: Reported on 10/02/2023) 30 tablet 0   promethazine (PHENERGAN) 25 MG tablet Take 1 tablet (25 mg total) by mouth every 6 (six) hours as needed for nausea or vomiting. (Patient not taking: Reported on 10/02/2023) 30 tablet 0   traZODone (DESYREL) 50 MG tablet Take 0.5 tablets (25 mg total) by mouth at bedtime as needed for sleep. (Patient not taking: Reported on 10/02/2023) 15 tablet 0   No current facility-administered medications for this visit.   Facility-Administered Medications Ordered in Other Visits  Medication Dose Route Frequency Provider Last Rate Last Admin   0.9 %  sodium chloride infusion   Intravenous Continuous Nathan Coder, MD 999 mL/hr at 10/02/23 1015 New Bag at 10/02/23 1015   heparin lock flush 100 UNIT/ML injection            heparin lock flush 100 UNIT/ML injection            heparin lock flush 100 unit/mL  500 Units  Intravenous Once Borders, Ivin Booty R, NP       sodium chloride flush (NS) 0.9 % injection 10 mL  10 mL Intravenous PRN Louretta Shorten R, MD   10 mL at 07/28/21 0852   sodium chloride flush (NS) 0.9 % injection 10 mL  10 mL Intravenous Once Borders, Daryl Eastern, NP        PHYSICAL EXAMINATION: ECOG PERFORMANCE STATUS: 1 - Symptomatic but completely ambulatory  Vitals:   10/02/23 0845  BP: 113/72  Pulse: (!) 101  Resp: 18  Temp: (!) 95.5 F (35.3 C)   Filed Weights   10/02/23 0845  Weight: 211 lb (95.7 kg)    Physical Exam HENT:     Head: Normocephalic and atraumatic.     Mouth/Throat:     Pharynx: No oropharyngeal exudate.  Eyes:     Pupils: Pupils are equal, round, and reactive to light.  Cardiovascular:     Rate and Rhythm: Normal rate and regular rhythm.  Pulmonary:     Effort: No respiratory distress.     Breath sounds: No wheezing.     Comments: Decreased breath sound on the left side compared to right. Abdominal:     General: Bowel sounds are normal. There is no distension.     Palpations: Abdomen is soft. There is no mass.     Tenderness: There is no abdominal tenderness. There is no guarding or rebound.  Musculoskeletal:        General: No tenderness. Normal range of motion.     Cervical back: Normal range of motion and neck supple.  Skin:    General: Skin is warm.  Neurological:     Mental Status: He is alert and oriented to person, place, and time.  Psychiatric:        Mood and Affect: Affect normal.    Fungal dermatitis bilateral buttocks groin/underneath abdominal pannus  LABORATORY DATA:  I have reviewed the data as listed Lab Results  Component Value Date   WBC 15.0 (H) 10/02/2023   HGB 11.4 (L) 10/02/2023   HCT 34.0 (L) 10/02/2023   MCV 96.9 10/02/2023   PLT 144 (L) 10/02/2023   Recent Labs    09/24/23 1103 09/24/23 1853 09/25/23 0411 09/25/23 1100 09/26/23 0455 09/26/23 1246 09/30/23 1339 10/02/23 0849  NA 133*   < > 134*   < >  133*  132* 131* 127*  K 3.5   < > 3.7   < > 4.1 3.6 3.7 3.8  CL 100   < > 103   < > 102 101 95* 94*  CO2 25   < > 25   < > 28 26 27 26   GLUCOSE 147*   < > 147*   < > 91 126* 137* 117*  BUN 14   < > 12   < > 10 11 10 10   CREATININE 0.74   < > 0.74   < > 0.84 0.68 0.72 0.80  CALCIUM 8.2*   < > 8.3*   < > 7.8* 7.8* 7.5* 7.7*  GFRNONAA >60   < > >60   < > >60 >60 >60 >60  PROT 4.9*  --  4.3*  --  4.3*  --   --  5.0*  ALBUMIN 3.0*  --  2.8*  --  2.4*  --   --  2.2*  AST 52*  --  52*  --  70*  --   --  103*  ALT 56*  --  54*  --  74*  --   --  108*  ALKPHOS 198*  --  187*  --  203*  --   --  310*  BILITOT 1.0  --  1.1  --  1.1  --   --  1.1  BILIDIR 0.5*  --  0.3*  --  0.5*  --   --   --   IBILI 0.5  --  0.8  --  0.6  --   --   --    < > = values in this interval not displayed.    RADIOGRAPHIC STUDIES: I have personally reviewed the radiological images as listed and agreed with the findings in the report. ECHOCARDIOGRAM COMPLETE Result Date: 09/26/2023    ECHOCARDIOGRAM REPORT   Patient Name:   LEVIS DUCRE Date of Exam: 09/26/2023 Medical Rec #:  [id number]       Height:       70.0 in Accession #:    [id number]      Weight:       190.0 lb Date of Birth:  [birth year]       BSA:          2.042 m Patient Age:    42 years        BP:           121/69 mmHg Patient Gender: M               HR:           90 bpm. Exam Location:  [facility] Procedure: 2D Echo, Cardiac Doppler and Color Doppler Indications:     Syncope R55  History:         Patient has no prior history of Echocardiogram examinations.                  Arrythmias:WPW; Signs/Symptoms:Dyspnea.  Sonographer:     Cristela Blue Referring Phys:  [id number] CARALYN HUDSON Diagnosing Phys: Alwyn Pea MD  Sonographer Comments: Suboptimal apical window. IMPRESSIONS  1. Left ventricular ejection fraction, by estimation, is 55 to 60%. The left ventricle has normal function. The left ventricle has no regional wall motion abnormalities. Left ventricular diastolic  parameters were normal.  2. Right ventricular systolic function is mildly reduced. The right ventricular size is moderately enlarged. Mildly increased right ventricular wall thickness.  3. The mitral valve is  normal in structure. Trivial mitral valve regurgitation.  4. The aortic valve is normal in structure. Aortic valve regurgitation is not visualized. FINDINGS  Left Ventricle: Left ventricular ejection fraction, by estimation, is 55 to 60%. The left ventricle has normal function. The left ventricle has no regional wall motion abnormalities. The left ventricular internal cavity size was normal in size. There is  no left ventricular hypertrophy. Left ventricular diastolic parameters were normal. Right Ventricle: The right ventricular size is moderately enlarged. Mildly increased right ventricular wall thickness. Right ventricular systolic function is mildly reduced. Left Atrium: Left atrial size was normal in size. Right Atrium: Right atrial size was normal in size. Pericardium: There is no evidence of pericardial effusion. Mitral Valve: The mitral valve is normal in structure. Trivial mitral valve regurgitation. Tricuspid Valve: The tricuspid valve is normal in structure. Tricuspid valve regurgitation is trivial. Aortic Valve: The aortic valve is normal in structure. Aortic valve regurgitation is not visualized. Aortic valve mean gradient measures 2.0 mmHg. Aortic valve peak gradient measures 2.5 mmHg. Aortic valve area, by VTI measures 3.70 cm. Pulmonic Valve: The pulmonic valve was normal in structure. Pulmonic valve regurgitation is not visualized. Aorta: The ascending aorta was not well visualized. IAS/Shunts: No atrial level shunt detected by color flow Doppler.  LEFT VENTRICLE PLAX 2D LVIDd:         4.60 cm   Diastology LVIDs:         3.20 cm   LV e' medial:    7.51 cm/s LV PW:         1.20 cm   LV E/e' medial:  8.9 LV IVS:        0.60 cm   LV e' lateral:   5.98 cm/s LVOT diam:     2.20 cm   LV E/e'  lateral: 11.2 LV SV:         54 LV SV Index:   26 LVOT Area:     3.80 cm  RIGHT VENTRICLE RV Basal diam:  3.60 cm RV Mid diam:    3.40 cm RV S prime:     18.00 cm/s TAPSE (M-mode): 2.0 cm LEFT ATRIUM           Index        RIGHT ATRIUM          Index LA diam:      2.40 cm 1.18 cm/m   RA Area:     6.33 cm LA Vol (A2C): 34.8 ml 17.04 ml/m  RA Volume:   8.00 ml  3.92 ml/m LA Vol (A4C): 19.5 ml 9.55 ml/m  AORTIC VALVE AV Area (Vmax):    3.43 cm AV Area (Vmean):   3.30 cm AV Area (VTI):     3.70 cm AV Vmax:           79.70 cm/s AV Vmean:          55.400 cm/s AV VTI:            0.146 m AV Peak Grad:      2.5 mmHg AV Mean Grad:      2.0 mmHg LVOT Vmax:         71.90 cm/s LVOT Vmean:        48.100 cm/s LVOT VTI:          0.142 m LVOT/AV VTI ratio: 0.97  AORTA Ao Root diam: 3.00 cm MITRAL VALVE               TRICUSPID VALVE MV  Area (PHT): 7.02 cm    TR Peak grad:   8.2 mmHg MV Decel Time: 108 msec    TR Vmax:        143.00 cm/s MV E velocity: 67.10 cm/s MV A velocity: 59.20 cm/s  SHUNTS MV E/A ratio:  1.13        Systemic VTI:  0.14 m                            Systemic Diam: 2.20 cm Alwyn Pea MD Electronically signed by Alwyn Pea MD Signature Date/Time: 09/26/2023/1:51:03 PM    Final    CT CHEST ABDOMEN PELVIS W CONTRAST Result Date: 09/24/2023 CLINICAL DATA:  Follow-up non-small cell lung cancer EXAM: CT CHEST, ABDOMEN, AND PELVIS WITH CONTRAST TECHNIQUE: Multidetector CT imaging of the chest, abdomen and pelvis was performed following the standard protocol during bolus administration of intravenous contrast. RADIATION DOSE REDUCTION: This exam was performed according to the departmental dose-optimization program which includes automated exposure control, adjustment of the mA and/or kV according to patient size and/or use of iterative reconstruction technique. CONTRAST:  OMNIPAQUE IOHEXOL 300 MG/ML  SOLN COMPARISON:  CT abdomen/pelvis dated 08/13/2023. PET-CT dated 07/16/2023. FINDINGS:  CT CHEST FINDINGS Cardiovascular: Heart is normal in size.  No pericardial effusion. No evidence of thoracic aortic aneurysm. Right chest port terminates at the cavoatrial junction. Mediastinum/Nodes: No suspicious mediastinal lymphadenopathy. Visualized thyroid is unremarkable. Lungs/Pleura: Moderate left pleural effusion with chronic pleural thickening. Mild complexity inferiorly (series 2/image 46). However, the effusion was non FDG avid on prior PET. Scarring/atelectasis in the lingula and left lower lobe. New patchy/ground-glass opacities in the right upper lobe, raising concern for atypical infection/pneumonia. Mild interlobular septal thickening in the right middle and lower lobes, nonspecific. No pneumothorax. Musculoskeletal: Lytic metastasis in the posterior T3 vertebral body (series 6/image 75). CT ABDOMEN PELVIS FINDINGS Hepatobiliary: Lobular hepatic contour. Atrophy/scarring of the left hepatic lobe. Associated low-density in the left hepatic lobe suggests ill-defined metastatic disease (series 2/image 49), although difficult to measure. Additional 4.0 cm low-density lesion in segment 5 (series 2/image 57), similar to prior CT, although with only mild hypermetabolism on prior PET. Regardless, residual hepatic metastases are favored. Gallbladder is unremarkable. No intrahepatic or extrahepatic duct dilatation. Pancreas: Within normal limits. Spleen: Within normal limits. Adrenals/Urinary Tract: Adrenal glands are within normal limits. Kidneys are within normal limits.  No hydronephrosis. Bladder is within normal limits. Stomach/Bowel: Stomach is within normal limits. No evidence of bowel obstruction. Normal appendix (series 2/image 90). No colonic wall thickening or inflammatory changes. Vascular/Lymphatic: No evidence of abdominal aortic aneurysm. No suspicious abdominopelvic lymphadenopathy. Reproductive: Prostate is unremarkable. Other: Moderate abdominal ascites, partially loculated. This was non  FDG avid on prior PET. Moderate body wall edema, new. Musculoskeletal: No focal osseous lesions. IMPRESSION: New patchy/ground-glass opacities in the right upper lobe, raising concern for atypical infection/pneumonia. Moderate left pleural effusion with chronic pleural thickening, unchanged. Suspected hepatic metastases, ill-defined/poorly evaluated, with superimposed left hepatic lobe scarring/atrophy. This appearance is similar to the prior. Stable lytic metastasis in the posterior T3 vertebral body. Moderate abdominal ascites, unchanged. Moderate body wall edema, new. Electronically Signed   By: Charline Bills M.D.   On: 09/24/2023 20:03   US Paracentesis Result Date: 09/24/2023 INDICATION: History of metastatic lung cancer with recurrent malignant ascites. Request is for therapeutic paracentesis EXAM: ULTRASOUND GUIDED THERAPEUTIC PARACENTESIS MEDICATIONS: Lidocaine 1% 10 mL COMPLICATIONS: None immediate. PROCEDURE: Informed written consent was obtained  from the patient after a discussion of the risks, benefits and alternatives to treatment. A timeout was performed prior to the initiation of the procedure. Initial ultrasound scanning demonstrates a small amount of ascites within the right lower abdominal quadrant. The right lower abdomen was prepped and draped in the usual sterile fashion. 1% lidocaine was used for local anesthesia. Following this, a 19 gauge, 7-cm, Yueh catheter was introduced. An ultrasound image was saved for documentation purposes. The paracentesis was performed. The catheter was removed and a dressing was applied. The patient tolerated the procedure well without immediate post procedural complication. Patient received post-procedure intravenous albumin; see nursing notes for details. FINDINGS: A total of approximately 2.7 L of straw-colored fluid was removed. Samples were sent to the laboratory as requested by the clinical team. IMPRESSION: Successful ultrasound-guided therapeutic  paracentesis yielding 2.7 liters of peritoneal fluid. Performed by Anders Grant NP Electronically Signed   By: Judie Petit.  Shick M.D.   On: 09/24/2023 16:04   US THORACENTESIS ASP PLEURAL SPACE W/IMG GUIDE Result Date: 09/23/2023 INDICATION: Patient with a history of lung cancer with recurrent pleural effusions. Interventional radiology asked to perform a therapeutic thoracentesis. EXAM: ULTRASOUND GUIDED THORACENTESIS MEDICATIONS: 1% lidocaine 15 mL COMPLICATIONS: None immediate. PROCEDURE: An ultrasound guided thoracentesis was thoroughly discussed with the patient and questions answered. The benefits, risks, alternatives and complications were also discussed. The patient understands and wishes to proceed with the procedure. Written consent was obtained. Ultrasound was performed to localize and mark an adequate pocket of fluid in the left chest. The area was then prepped and draped in the normal sterile fashion. 1% Lidocaine was used for local anesthesia. Under ultrasound guidance a 6 Fr Safe-T-Centesis catheter was introduced. Thoracentesis was performed. The catheter was removed and a dressing applied. FINDINGS: A total of approximately 450 mL of dark red fluid was removed. IMPRESSION: Successful ultrasound guided left thoracentesis yielding 450 mL of pleural fluid. Procedure performed by Alwyn Ren NP Electronically Signed   By: Judie Petit.  Shick M.D.   On: 09/23/2023 11:02   DG Chest Port 1 View Result Date: 09/23/2023 CLINICAL DATA:  Status post left thoracentesis. EXAM: PORTABLE CHEST 1 VIEW COMPARISON:  September 21, 2023. FINDINGS: No pneumothorax is noted status post left thoracentesis. Left pleural effusion is slightly decreased compared to prior exam. IMPRESSION: No pneumothorax status post left thoracentesis. Electronically Signed   By: Lupita Raider M.D.   On: 09/23/2023 10:54   DG Chest 2 View Result Date: 09/21/2023 CLINICAL DATA:  Suspected sepsis. Lung and liver cancer per patient. Vomiting.  EXAM: CHEST - 2 VIEW COMPARISON:  Chest radiographs 09/09/2023 and 09/06/2023. Chest CT 07/16/2022. FINDINGS: Right IJ Port-A-Cath extends to the level of the superior cavoatrial junction, stable. The visualized heart size and mediastinal contours are stable. A large laterally loculated left pleural effusion appears slightly increased compared with the most recent prior study. Associated chronic compressive left lung atelectasis. No evidence of right-sided pleural effusion or pneumothorax. Mild right basilar atelectasis, unchanged. The bones appear unchanged. IMPRESSION: Slight increase in size of a large laterally loculated left pleural effusion with associated chronic compressive left lung atelectasis. No other significant changes. Electronically Signed   By: Carey Bullocks M.D.   On: 09/21/2023 14:19   US Paracentesis Result Date: 09/17/2023 INDICATION: Metastatic lung cancer and malignant ascites. Request for therapeutic paracentesis. EXAM: ULTRASOUND GUIDED PARACENTESIS MEDICATIONS: 1% lidocaine 10 mL COMPLICATIONS: None immediate. PROCEDURE: Informed written consent was obtained from the patient after a discussion of the risks,  benefits and alternatives to treatment. A timeout was performed prior to the initiation of the procedure. Initial ultrasound scanning demonstrates a large amount of ascites within the right lateral abdomen. The right lateral abdomen was prepped and draped in the usual sterile fashion. 1% lidocaine was used for local anesthesia. Following this, a 19 gauge, 7-cm, Yueh catheter was introduced. An ultrasound image was saved for documentation purposes. The paracentesis was performed. The catheter was removed and a dressing was applied. The patient tolerated the procedure well without immediate post procedural complication. FINDINGS: A total of approximately 5.6 L of clear yellow fluid was removed. IMPRESSION: Successful ultrasound-guided paracentesis yielding 5.6 liters of peritoneal  fluid. Electronically Signed   By: Gilmer Mor D.O.   On: 09/17/2023 12:03   DG Chest Port 1 View Result Date: 09/09/2023 CLINICAL DATA:  Left lower lobe lung cancer. Status post left thoracentesis. EXAM: PORTABLE CHEST 1 VIEW COMPARISON:  09/06/2023 FINDINGS: Power injectable Port-A-Cath tip: Cavoatrial junction. Minimal increase in aeration in the left lung, large left pleural effusion observed. No pneumothorax or radiographic evidence of procedure complication. The right lung remains clear. Heart size within normal limits although the left apical border remains obscured. IMPRESSION: 1. Minimal increase in aeration in the left lung, large left pleural effusion observed. No pneumothorax or radiographic evidence of procedure complication. Electronically Signed   By: Gaylyn Rong M.D.   On: 09/09/2023 14:38   US THORACENTESIS ASP PLEURAL SPACE W/IMG GUIDE Result Date: 09/09/2023 INDICATION: Patient with history of left lung cancer with recurrent left-sided pleural effusion. Request is for therapeutic thoracentesis EXAM: ULTRASOUND GUIDED THERAPEUTIC LEFT-SIDED THORACENTESIS MEDICATIONS: Lidocaine 1% 10 mL COMPLICATIONS: None immediate. PROCEDURE: An ultrasound guided thoracentesis was thoroughly discussed with the patient and questions answered. The benefits, risks, alternatives and complications were also discussed. The patient understands and wishes to proceed with the procedure. Written consent was obtained. Ultrasound was performed to localize and mark an adequate pocket of fluid in the left chest. The area was then prepped and draped in the normal sterile fashion. 1% Lidocaine was used for local anesthesia. Under ultrasound guidance a 6 Fr Safe-T-Centesis catheter was introduced. Thoracentesis was performed. The catheter was removed and a dressing applied. FINDINGS: A total of approximately 450 mL of reddish brown fluid was removed. Samples were sent to the laboratory as requested by the clinical  team. IMPRESSION: Successful ultrasound guided therapeutic left-sided thoracentesis yielding 450 mL of pleural fluid. Performed by Anders Grant NP Electronically Signed   By: Judie Petit.  Shick M.D.   On: 09/09/2023 13:58   DG Chest 2 View Result Date: 09/06/2023 CLINICAL DATA:  Cough and shortness of breath. History of lung cancer. EXAM: CHEST - 2 VIEW COMPARISON:  Chest x-ray dated August 13, 2023. FINDINGS: Unchanged right chest wall port catheter. Stable cardiomediastinal silhouette with normal heart size. Unchanged chronic loculated large left pleural effusion with atelectasis in the lingula and left lower lobe. Slightly increased interstitial thickening in the right lung. No pneumothorax. No acute osseous abnormality. IMPRESSION: 1. Slightly increased interstitial thickening in the right lung, which may reflect atypical infection or pulmonary edema. 2. Unchanged chronic loculated large left pleural effusion. Electronically Signed   By: Obie Dredge M.D.   On: 09/06/2023 17:11   US Paracentesis Result Date: 09/06/2023 INDICATION: malignant ascites EXAM: ULTRASOUND GUIDED  PARACENTESIS MEDICATIONS: None. COMPLICATIONS: None immediate. PROCEDURE: Informed written consent was obtained from the patient after a discussion of the risks, benefits and alternatives to treatment. A timeout was performed prior  to the initiation of the procedure. Initial ultrasound scanning demonstrates a large amount of ascites within the right lower abdominal quadrant. The right lower abdomen was prepped and draped in the usual sterile fashion. 1% lidocaine was used for local anesthesia. Following this, a 19 gauge, 7-cm, Yueh catheter was introduced. An ultrasound image was saved for documentation purposes. The paracentesis was performed. The catheter was removed and a dressing was applied. The patient tolerated the procedure well without immediate post procedural complication. FINDINGS: A total of approximately 4.8 L of clear,  straw-colored fluid was removed. IMPRESSION: Successful ultrasound-guided paracentesis yielding 4.8 liters of peritoneal fluid. Electronically Signed   By: Olive Bass M.D.   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 s /p second opinion at [facility]- [name].  Status post multiple lines of therapy-most recently on Carboplatin-Alimta-Amivatimab [since Oct 2024] x 2 cycles;  CT scan- DEC 2024- overall stable on imaging-contralateral right lung-interstitial thickening [atypical pneumonia versus lymphangitic spread]; Moderate left pleural effusion with chronic pleural thickening; Suspected hepatic metastases, ill-defined/poorly evaluated, with superimposed left hepatic lobe scarring/atrophy. This appearance is similar to the prior; Stable lytic metastasis in the posterior T3 vertebral body; Moderate abdominal ascites, unchanged.  # I had a long discussion with the patient his wife regarding the clinical progression-the context of eqivocal imaging of the CT scan.  Given his worsening liver function and worsening performance status I would recommend discontinuation of further chemotherapy.  HER2 testing on the tumor sample is pending.  However, very concerned if patient was able to tolerate further lines of chemotherapy.  I discussed regarding hospice-see discussion below  # Hyponatremia 129-underlying malignancy continue ADH antagonist/as per nephrology Dr. Lourdes Sledge.  # Abdominal discomfort/ascites [CT scan- NOv 2024]- s/p paracentesis x2-cytology positive for adenocarcinoma- proceed with paracentesis; declines pleurex catheter.  #Asymptomatic multiple brain mets subcentimeter asymptomatic-  -s/p SBRT SEP 2023- [[place]].  S/p SBRT [[place]]- FEB 12th, 2024. July 9th, 2024-  Stable clinically. Decreased conspicuity of all enhancing lesions with some lesions described on  the prior no longer visible and others less visible. No new lesions identified  S/p evaluation with neuro-oncology- MRI Brain- OCT, 2024-clinically stable. s/p evaluation with Dr.Vaslow.   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 On  Eliquis 2.5 mg BID [platelets- 189].   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  # IV access/ port malfunction-s/p tPA.   # I had a long discussion with the patient and his wife regarding the overall poor prognosis of patients;s underlying malignancy given lack of significant improvement of clinical status/and worsening liver function-explaining his symptoms.  Discussed regarding hospice evaluation.  After much reluctance patient agrees with hospice evaluation.  Hospice referral made.  # DNR/DNI-  [Tuesday/wed]- pref B12  on 10/23  # DISPOSITION: # paracentesis ASAP # referral to hospice re: lung cancer #  follow up on 12/27 with [name] or Lauren - MD; labs- cbc/cmp; possible 1 lit IVFs over 1 hour-Dr.B  All questions were answered. The patient knows to call the clinic with any problems, questions or concerns.    Nathan Coder, MD 10/02/2023 11:57 AM

## 2023-10-02 NOTE — Progress Notes (Signed)
D/C from [facility] 09/26/23 for Syncope.  Having vomiting, hasn't had levofloxacin in 2 days.  He feels like he needs fluid taken off of his stomach.  Has trazodone, helps but has a hard time falling asleep.  Appetite 25% normal, no supplements drinks, states boost gives him diarrhea.

## 2023-10-02 NOTE — Progress Notes (Signed)
Palliative Medicine [facility] at [facility] Telephone:([phone] [phone]   Name: [name] Date: 10/02/2023 MRN: [id number]  DOB: 1981/06/06  Patient Care Team: Pcp, No as PCP - General [name], RN as Oncology Nurse Navigator [name], MD as Consulting Physician (Internal Medicine)    REASON FOR CONSULTATION: [name] is a 42 y.o. male with multiple medical problems including stage IV adenocarcinoma of the lung status post multiple previous lines of systemic treatment.  Palliative care was consulted to address goals.   SOCIAL HISTORY:     reports that he has never smoked. He has quit using smokeless tobacco.  His smokeless tobacco use included snuff and chew. He reports that he does not drink alcohol and does not use drugs.  Patient is married lives at home with his wife and daughter.  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   PORTA CATH INSERTION N/A 03/28/2021   Procedure: PORTA CATH INSERTION;  Surgeon: Renford Dills, MD;  Location: [facility] INVASIVE CV LAB;  Service: Cardiovascular;  Laterality: N/A;   VIDEO BRONCHOSCOPY WITH ENDOBRONCHIAL NAVIGATION N/A 03/15/2021   Procedure: VIDEO BRONCHOSCOPY WITH ENDOBRONCHIAL NAVIGATION;  Surgeon: Vida Rigger, MD;  Location: [facility] ORS;  Service: Thoracic;  Laterality: N/A;   VIDEO BRONCHOSCOPY WITH ENDOBRONCHIAL ULTRASOUND N/A 03/15/2021   Procedure: VIDEO BRONCHOSCOPY WITH ENDOBRONCHIAL ULTRASOUND;  Surgeon: Vida Rigger, MD;  Location: [facility] ORS;  Service: Thoracic;  Laterality: N/A;     HEMATOLOGY/ONCOLOGY HISTORY:  Oncology History Overview Note  IMPRESSION: 1. Patchy nodular fat stranding throughout the anterior left upper quadrant peritoneal fat, nonspecific, cannot exclude peritoneal carcinomatosis. Dedicated CT abdomen/pelvis with oral and IV contrast recommended for further evaluation. 2. Dense patchy consolidation replacing much of the left lower lung lobe, appearing masslike in the superior segment left lower lobe, with associated bulging of the left major fissure and associated left lower lobe volume loss. Fine nodularity throughout both lungs with an upper lobe predominance. Asymmetric left upper lobe interlobular septal thickening. These findings are indeterminate, with differential including multilobar pneumonia, sarcoidosis or a neoplastic process. The persistence on radiographs back to 01/20/2021 despite antibiotic therapy make sarcoidosis or a neoplastic process more likely. Pulmonology consultation suggested for consideration of bronchoscopic evaluation. 3. Small dependent left pleural effusion. 4. Mild mediastinal lymphadenopathy, nonspecific. 5. Subacute healing lateral right sixth rib fracture.   DIAGNOSIS:  A. LUNG, LEFT LOWER LOBE; ENB-ASSISTED BIOPSY:  - NON-SMALL CELL CARCINOMA, FAVOR ADENOCARCINOMA.  - FOREIGN MATERIAL SUGGESTIVE OF ASPIRATION.   # EGFR MUTATED: 63 del- June 28th, 2022- [name]. [limited]  # Asymptomatic multiple brain mets subcentimeter asymptomatic -JUNE 29th, 2023- Numerous metastatic lesions in the supratentorial brain-increase in number also conspicuity compared to January 2023.  Pre-SBRT MRI brain AUG 18th, 2023-  Eighteen small enhancing brain metastases (punctate to 11 mm); were all present on 03/28/2021-s/p SBRT SEP 2023- [[place]]; SRS/ radiation treatment for brain metastasis He finished treatment on 11-26-22.   # SEP-OCT-worsening left-sided pleural effusion-status post paracentesis; exudative; Cytology  negative- ?  Progressive disease.  #  NOV 2023- JAN 2024 CARBO-TAXOL-BEV+ATEZO x4 cyles- BEV+ATEZO- until APRIL 2024  # STAGE IV- [JUNE 2022] Left lower lobe lung cancer-adenocarcinoma the lung;  EGFR- 19 del POSITIVE.  While on osimertinib- NOV 1st, 2023- PET scan- shows progressive disease in liver. Liquid Biopsy/Guardant testing-positive for EGFR mutation; otherwise Negative for any novel mutations.  Repeat liver Biopsy positive for adenocarcinoma. NEGATIVE for MET amplification.  S/p second opinion at [facility]- Dr.Stinchcomb. status post 4 cycles of carbo Taxol-Atezo+Bev- JAN 25th, 2024-PET- Interval response to therapy with resolution of previously demonstrated extensive hypermetabolic activity throughout the liver. PARIL 2024- ? Partial response, but clinically worse/rising LFTs  # MAY 6th, Brain MRI-progressive multiple brain metastases  # MAY 16th, 2024- Alimta+Avastin #1    Cancer of lower lobe of left lung (HCC)  03/23/2021 Initial Diagnosis   Cancer of lower lobe of left lung (HCC)   03/29/2021 Cancer Staging   Staging form: Lung, AJCC 8th Edition - Clinical: Stage IVB (cT3, cN3, pM1c) - Signed by [name], MD on 03/29/2021   03/30/2021 - 03/30/2021 Chemotherapy   Patient is on Treatment Plan : LUNG NSCLC Pemetrexed (Alimta) / Carboplatin q21d x 1 cycles     08/17/2022 - 01/15/2023 Chemotherapy   Patient is on Treatment Plan : LUNG Atezolizumab + Bevacizumab + Carboplatin + Paclitaxel q21d Induction x 4 cycles / Atezolizumab + Bevacizumab q21d Maintenance     02/28/2023 - 07/02/2023 Chemotherapy   Patient is on Treatment Plan : LUNG NON-SMALL CELL Bevacizumab + Pemetrexed q21d     08/07/2023 -  Chemotherapy   Patient is on Treatment Plan : LUNG NSCLC Pemetrexed + Carboplatin + Amivantamab q21d x 4 Cycles / Pemetrexed + Amivantamab q21d       ALLERGIES:  is allergic to codeine.  MEDICATIONS:  Current Outpatient Medications  Medication Sig Dispense Refill   albuterol  (VENTOLIN HFA) 108 (90 Base) MCG/ACT inhaler Inhale 2 puffs into the lungs every 6 (six) hours as needed for wheezing or shortness of breath. 8 g 2   calcium carbonate (TUMS EX) 750 MG chewable tablet Chew 1 tablet by mouth daily.     folic acid (FOLVITE) 1 MG tablet TAKE 1 TABLET BY MOUTH EVERY DAY 90 tablet 1   furosemide (LASIX) 20 MG tablet Take 2 tablets (40 mg total) by mouth daily. 30 tablet 5   lactulose (CHRONULAC) 10 GM/15ML solution Take 30 mLs (20 g total) by mouth 2 (two) times daily as needed for mild constipation or moderate constipation (Titrate to 1-2 BM per day). (Patient not taking: Reported on 10/02/2023) 236 mL 0   levofloxacin (LEVAQUIN) 750 MG tablet Take 1 tablet (750 mg total) by mouth daily for 7 days. 7 tablet 0   lidocaine-prilocaine (EMLA) cream Apply 1 Application topically as needed. 30 g 0   methylphenidate (RITALIN) 5 MG tablet Take 1 tablet (5 mg total) by mouth 2 (two) times daily as needed (fatigue). (Patient not taking: Reported on 10/02/2023) 60 tablet 0   metoCLOPramide (REGLAN) 10 MG tablet Take 1 tablet (10 mg total) by mouth every 8 (eight) hours as needed for nausea. (Patient not taking: Reported on 10/02/2023) 30 tablet 1   metoprolol tartrate (LOPRESSOR) 25 MG tablet Take 1 tablet (25 mg total) by mouth 2 (two) times daily. 30 tablet 11   montelukast (SINGULAIR) 10 MG tablet TAKE 1 TABLET BY MOUTH EVERYDAY AT BEDTIME (Patient not taking: Reported on 10/02/2023) 90 tablet 1   ondansetron (ZOFRAN-ODT) 4 MG disintegrating tablet Take 1 tablet (  4 mg total) by mouth every 8 (eight) hours as needed for nausea or vomiting. 20 tablet 1   pantoprazole (PROTONIX) 40 MG tablet Take 1 tablet (40 mg total) by mouth daily. 90 tablet 3   prochlorperazine (COMPAZINE) 10 MG tablet Take 1 tablet (10 mg total) by mouth every 6 (six) hours as needed for nausea or vomiting. (Patient not taking: Reported on 10/02/2023) 30 tablet 0   promethazine (PHENERGAN) 25 MG tablet Take 1  tablet (25 mg total) by mouth every 6 (six) hours as needed for nausea or vomiting. (Patient not taking: Reported on 10/02/2023) 30 tablet 0   traZODone (DESYREL) 50 MG tablet Take 0.5 tablets (25 mg total) by mouth at bedtime as needed for sleep. (Patient not taking: Reported on 10/02/2023) 15 tablet 0   triamcinolone ointment (KENALOG) 0.5 % Apply 1 Application topically 2 (two) times daily. 30 g 1   No current facility-administered medications for this visit.   Facility-Administered Medications Ordered in Other Visits  Medication Dose Route Frequency Provider Last Rate Last Admin   0.9 %  sodium chloride infusion   Intravenous Continuous [name], MD 999 mL/hr at 10/02/23 1015 New Bag at 10/02/23 1015   heparin lock flush 100 UNIT/ML injection            heparin lock flush 100 UNIT/ML injection            heparin lock flush 100 unit/mL  500 Units Intravenous Once Melvyn Hommes, Ivin Booty R, NP       sodium chloride flush (NS) 0.9 % injection 10 mL  10 mL Intravenous PRN Louretta Shorten R, MD   10 mL at 07/28/21 0852   sodium chloride flush (NS) 0.9 % injection 10 mL  10 mL Intravenous Once Rome Schlauch, Daryl Eastern, NP        VITAL SIGNS: There were no vitals taken for this visit. There were no vitals filed for this visit.  Estimated body mass index is 30.28 kg/m as calculated from the following:   Height as of an earlier encounter on 10/02/23: 5\' 10"  (1.778 m).   Weight as of an earlier encounter on 10/02/23: 211 lb (95.7 kg).  LABS: CBC:    Component Value Date/Time   WBC 15.0 (H) 10/02/2023 0849   WBC 13.8 (H) 09/26/2023 0455   HGB 11.4 (L) 10/02/2023 0849   HCT 34.0 (L) 10/02/2023 0849   PLT 144 (L) 10/02/2023 0849   MCV 96.9 10/02/2023 0849   NEUTROABS 10.1 (H) 10/02/2023 0849   LYMPHSABS 1.5 10/02/2023 0849   MONOABS 2.7 (H) 10/02/2023 0849   EOSABS 0.0 10/02/2023 0849   BASOSABS 0.1 10/02/2023 0849   Comprehensive Metabolic Panel:    Component Value Date/Time   NA  127 (L) 10/02/2023 0849   K 3.8 10/02/2023 0849   CL 94 (L) 10/02/2023 0849   CO2 26 10/02/2023 0849   BUN 10 10/02/2023 0849   CREATININE 0.80 10/02/2023 0849   GLUCOSE 117 (H) 10/02/2023 0849   CALCIUM 7.7 (L) 10/02/2023 0849   AST 103 (H) 10/02/2023 0849   ALT 108 (H) 10/02/2023 0849   ALKPHOS 310 (H) 10/02/2023 0849   BILITOT 1.1 10/02/2023 0849   PROT 5.0 (L) 10/02/2023 0849   ALBUMIN 2.2 (L) 10/02/2023 0849    RADIOGRAPHIC STUDIES: ECHOCARDIOGRAM COMPLETE Result Date: 09/26/2023    ECHOCARDIOGRAM REPORT   Patient Name:   HALDON BARLET Date of Exam: 09/26/2023 Medical Rec #:  [id number]  Height:       70.0 in Accession #:    [id number]      Weight:       190.0 lb Date of Birth:  [birth year]       BSA:          2.042 m Patient Age:    42 years        BP:           121/69 mmHg Patient Gender: M               HR:           90 bpm. Exam Location:  [facility] Procedure: 2D Echo, Cardiac Doppler and Color Doppler Indications:     Syncope R55  History:         Patient has no prior history of Echocardiogram examinations.                  Arrythmias:WPW; Signs/Symptoms:Dyspnea.  Sonographer:     Cristela Blue Referring Phys:  [id number] CARALYN HUDSON Diagnosing Phys: Alwyn Pea MD  Sonographer Comments: Suboptimal apical window. IMPRESSIONS  1. Left ventricular ejection fraction, by estimation, is 55 to 60%. The left ventricle has normal function. The left ventricle has no regional wall motion abnormalities. Left ventricular diastolic parameters were normal.  2. Right ventricular systolic function is mildly reduced. The right ventricular size is moderately enlarged. Mildly increased right ventricular wall thickness.  3. The mitral valve is normal in structure. Trivial mitral valve regurgitation.  4. The aortic valve is normal in structure. Aortic valve regurgitation is not visualized. FINDINGS  Left Ventricle: Left ventricular ejection fraction, by estimation, is 55 to 60%. The left ventricle has  normal function. The left ventricle has no regional wall motion abnormalities. The left ventricular internal cavity size was normal in size. There is  no left ventricular hypertrophy. Left ventricular diastolic parameters were normal. Right Ventricle: The right ventricular size is moderately enlarged. Mildly increased right ventricular wall thickness. Right ventricular systolic function is mildly reduced. Left Atrium: Left atrial size was normal in size. Right Atrium: Right atrial size was normal in size. Pericardium: There is no evidence of pericardial effusion. Mitral Valve: The mitral valve is normal in structure. Trivial mitral valve regurgitation. Tricuspid Valve: The tricuspid valve is normal in structure. Tricuspid valve regurgitation is trivial. Aortic Valve: The aortic valve is normal in structure. Aortic valve regurgitation is not visualized. Aortic valve mean gradient measures 2.0 mmHg. Aortic valve peak gradient measures 2.5 mmHg. Aortic valve area, by VTI measures 3.70 cm. Pulmonic Valve: The pulmonic valve was normal in structure. Pulmonic valve regurgitation is not visualized. Aorta: The ascending aorta was not well visualized. IAS/Shunts: No atrial level shunt detected by color flow Doppler.  LEFT VENTRICLE PLAX 2D LVIDd:         4.60 cm   Diastology LVIDs:         3.20 cm   LV e' medial:    7.51 cm/s LV PW:         1.20 cm   LV E/e' medial:  8.9 LV IVS:        0.60 cm   LV e' lateral:   5.98 cm/s LVOT diam:     2.20 cm   LV E/e' lateral: 11.2 LV SV:         54 LV SV Index:   26 LVOT Area:     3.80 cm  RIGHT VENTRICLE RV Basal diam:  3.60 cm  RV Mid diam:    3.40 cm RV S prime:     18.00 cm/s TAPSE (M-mode): 2.0 cm LEFT ATRIUM           Index        RIGHT ATRIUM          Index LA diam:      2.40 cm 1.18 cm/m   RA Area:     6.33 cm LA Vol (A2C): 34.8 ml 17.04 ml/m  RA Volume:   8.00 ml  3.92 ml/m LA Vol (A4C): 19.5 ml 9.55 ml/m  AORTIC VALVE AV Area (Vmax):    3.43 cm AV Area (Vmean):   3.30  cm AV Area (VTI):     3.70 cm AV Vmax:           79.70 cm/s AV Vmean:          55.400 cm/s AV VTI:            0.146 m AV Peak Grad:      2.5 mmHg AV Mean Grad:      2.0 mmHg LVOT Vmax:         71.90 cm/s LVOT Vmean:        48.100 cm/s LVOT VTI:          0.142 m LVOT/AV VTI ratio: 0.97  AORTA Ao Root diam: 3.00 cm MITRAL VALVE               TRICUSPID VALVE MV Area (PHT): 7.02 cm    TR Peak grad:   8.2 mmHg MV Decel Time: 108 msec    TR Vmax:        143.00 cm/s MV E velocity: 67.10 cm/s MV A velocity: 59.20 cm/s  SHUNTS MV E/A ratio:  1.13        Systemic VTI:  0.14 m                            Systemic Diam: 2.20 cm Alwyn Pea MD Electronically signed by Alwyn Pea MD Signature Date/Time: 09/26/2023/1:51:03 PM    Final    CT CHEST ABDOMEN PELVIS W CONTRAST Result Date: 09/24/2023 CLINICAL DATA:  Follow-up non-small cell lung cancer EXAM: CT CHEST, ABDOMEN, AND PELVIS WITH CONTRAST TECHNIQUE: Multidetector CT imaging of the chest, abdomen and pelvis was performed following the standard protocol during bolus administration of intravenous contrast. RADIATION DOSE REDUCTION: This exam was performed according to the departmental dose-optimization program which includes automated exposure control, adjustment of the mA and/or kV according to patient size and/or use of iterative reconstruction technique. CONTRAST:  OMNIPAQUE IOHEXOL 300 MG/ML  SOLN COMPARISON:  CT abdomen/pelvis dated 08/13/2023. PET-CT dated 07/16/2023. FINDINGS: CT CHEST FINDINGS Cardiovascular: Heart is normal in size.  No pericardial effusion. No evidence of thoracic aortic aneurysm. Right chest port terminates at the cavoatrial junction. Mediastinum/Nodes: No suspicious mediastinal lymphadenopathy. Visualized thyroid is unremarkable. Lungs/Pleura: Moderate left pleural effusion with chronic pleural thickening. Mild complexity inferiorly (series 2/image 46). However, the effusion was non FDG avid on prior PET.  Scarring/atelectasis in the lingula and left lower lobe. New patchy/ground-glass opacities in the right upper lobe, raising concern for atypical infection/pneumonia. Mild interlobular septal thickening in the right middle and lower lobes, nonspecific. No pneumothorax. Musculoskeletal: Lytic metastasis in the posterior T3 vertebral body (series 6/image 75). CT ABDOMEN PELVIS FINDINGS Hepatobiliary: Lobular hepatic contour. Atrophy/scarring of the left hepatic lobe. Associated low-density in the left hepatic lobe suggests  ill-defined metastatic disease (series 2/image 49), although difficult to measure. Additional 4.0 cm low-density lesion in segment 5 (series 2/image 57), similar to prior CT, although with only mild hypermetabolism on prior PET. Regardless, residual hepatic metastases are favored. Gallbladder is unremarkable. No intrahepatic or extrahepatic duct dilatation. Pancreas: Within normal limits. Spleen: Within normal limits. Adrenals/Urinary Tract: Adrenal glands are within normal limits. Kidneys are within normal limits.  No hydronephrosis. Bladder is within normal limits. Stomach/Bowel: Stomach is within normal limits. No evidence of bowel obstruction. Normal appendix (series 2/image 90). No colonic wall thickening or inflammatory changes. Vascular/Lymphatic: No evidence of abdominal aortic aneurysm. No suspicious abdominopelvic lymphadenopathy. Reproductive: Prostate is unremarkable. Other: Moderate abdominal ascites, partially loculated. This was non FDG avid on prior PET. Moderate body wall edema, new. Musculoskeletal: No focal osseous lesions. IMPRESSION: New patchy/ground-glass opacities in the right upper lobe, raising concern for atypical infection/pneumonia. Moderate left pleural effusion with chronic pleural thickening, unchanged. Suspected hepatic metastases, ill-defined/poorly evaluated, with superimposed left hepatic lobe scarring/atrophy. This appearance is similar to the prior. Stable lytic  metastasis in the posterior T3 vertebral body. Moderate abdominal ascites, unchanged. Moderate body wall edema, new. Electronically Signed   By: Charline Bills M.D.   On: 09/24/2023 20:03   US Paracentesis Result Date: 09/24/2023 INDICATION: History of metastatic lung cancer with recurrent malignant ascites. Request is for therapeutic paracentesis EXAM: ULTRASOUND GUIDED THERAPEUTIC PARACENTESIS MEDICATIONS: Lidocaine 1% 10 mL COMPLICATIONS: None immediate. PROCEDURE: Informed written consent was obtained from the patient after a discussion of the risks, benefits and alternatives to treatment. A timeout was performed prior to the initiation of the procedure. Initial ultrasound scanning demonstrates a small amount of ascites within the right lower abdominal quadrant. The right lower abdomen was prepped and draped in the usual sterile fashion. 1% lidocaine was used for local anesthesia. Following this, a 19 gauge, 7-cm, Yueh catheter was introduced. An ultrasound image was saved for documentation purposes. The paracentesis was performed. The catheter was removed and a dressing was applied. The patient tolerated the procedure well without immediate post procedural complication. Patient received post-procedure intravenous albumin; see nursing notes for details. FINDINGS: A total of approximately 2.7 L of straw-colored fluid was removed. Samples were sent to the laboratory as requested by the clinical team. IMPRESSION: Successful ultrasound-guided therapeutic paracentesis yielding 2.7 liters of peritoneal fluid. Performed by Anders Grant NP Electronically Signed   By: Judie Petit.  Shick M.D.   On: 09/24/2023 16:04   US THORACENTESIS ASP PLEURAL SPACE W/IMG GUIDE Result Date: 09/23/2023 INDICATION: Patient with a history of lung cancer with recurrent pleural effusions. Interventional radiology asked to perform a therapeutic thoracentesis. EXAM: ULTRASOUND GUIDED THORACENTESIS MEDICATIONS: 1% lidocaine 15 mL  COMPLICATIONS: None immediate. PROCEDURE: An ultrasound guided thoracentesis was thoroughly discussed with the patient and questions answered. The benefits, risks, alternatives and complications were also discussed. The patient understands and wishes to proceed with the procedure. Written consent was obtained. Ultrasound was performed to localize and mark an adequate pocket of fluid in the left chest. The area was then prepped and draped in the normal sterile fashion. 1% Lidocaine was used for local anesthesia. Under ultrasound guidance a 6 Fr Safe-T-Centesis catheter was introduced. Thoracentesis was performed. The catheter was removed and a dressing applied. FINDINGS: A total of approximately 450 mL of dark red fluid was removed. IMPRESSION: Successful ultrasound guided left thoracentesis yielding 450 mL of pleural fluid. Procedure performed by Alwyn Ren NP Electronically Signed   By: Judie Petit.  Shick M.D.  On: 09/23/2023 11:02   DG Chest Port 1 View Result Date: 09/23/2023 CLINICAL DATA:  Status post left thoracentesis. EXAM: PORTABLE CHEST 1 VIEW COMPARISON:  September 21, 2023. FINDINGS: No pneumothorax is noted status post left thoracentesis. Left pleural effusion is slightly decreased compared to prior exam. IMPRESSION: No pneumothorax status post left thoracentesis. Electronically Signed   By: Lupita Raider M.D.   On: 09/23/2023 10:54   DG Chest 2 View Result Date: 09/21/2023 CLINICAL DATA:  Suspected sepsis. Lung and liver cancer per patient. Vomiting. EXAM: CHEST - 2 VIEW COMPARISON:  Chest radiographs 09/09/2023 and 09/06/2023. Chest CT 07/16/2022. FINDINGS: Right IJ Port-A-Cath extends to the level of the superior cavoatrial junction, stable. The visualized heart size and mediastinal contours are stable. A large laterally loculated left pleural effusion appears slightly increased compared with the most recent prior study. Associated chronic compressive left lung atelectasis. No evidence of  right-sided pleural effusion or pneumothorax. Mild right basilar atelectasis, unchanged. The bones appear unchanged. IMPRESSION: Slight increase in size of a large laterally loculated left pleural effusion with associated chronic compressive left lung atelectasis. No other significant changes. Electronically Signed   By: Carey Bullocks M.D.   On: 09/21/2023 14:19   US Paracentesis Result Date: 09/17/2023 INDICATION: Metastatic lung cancer and malignant ascites. Request for therapeutic paracentesis. EXAM: ULTRASOUND GUIDED PARACENTESIS MEDICATIONS: 1% lidocaine 10 mL COMPLICATIONS: None immediate. PROCEDURE: Informed written consent was obtained from the patient after a discussion of the risks, benefits and alternatives to treatment. A timeout was performed prior to the initiation of the procedure. Initial ultrasound scanning demonstrates a large amount of ascites within the right lateral abdomen. The right lateral abdomen was prepped and draped in the usual sterile fashion. 1% lidocaine was used for local anesthesia. Following this, a 19 gauge, 7-cm, Yueh catheter was introduced. An ultrasound image was saved for documentation purposes. The paracentesis was performed. The catheter was removed and a dressing was applied. The patient tolerated the procedure well without immediate post procedural complication. FINDINGS: A total of approximately 5.6 L of clear yellow fluid was removed. IMPRESSION: Successful ultrasound-guided paracentesis yielding 5.6 liters of peritoneal fluid. Electronically Signed   By: Gilmer Mor D.O.   On: 09/17/2023 12:03   DG Chest Port 1 View Result Date: 09/09/2023 CLINICAL DATA:  Left lower lobe lung cancer. Status post left thoracentesis. EXAM: PORTABLE CHEST 1 VIEW COMPARISON:  09/06/2023 FINDINGS: Power injectable Port-A-Cath tip: Cavoatrial junction. Minimal increase in aeration in the left lung, large left pleural effusion observed. No pneumothorax or radiographic evidence of  procedure complication. The right lung remains clear. Heart size within normal limits although the left apical border remains obscured. IMPRESSION: 1. Minimal increase in aeration in the left lung, large left pleural effusion observed. No pneumothorax or radiographic evidence of procedure complication. Electronically Signed   By: Gaylyn Rong M.D.   On: 09/09/2023 14:38   US THORACENTESIS ASP PLEURAL SPACE W/IMG GUIDE Result Date: 09/09/2023 INDICATION: Patient with history of left lung cancer with recurrent left-sided pleural effusion. Request is for therapeutic thoracentesis EXAM: ULTRASOUND GUIDED THERAPEUTIC LEFT-SIDED THORACENTESIS MEDICATIONS: Lidocaine 1% 10 mL COMPLICATIONS: None immediate. PROCEDURE: An ultrasound guided thoracentesis was thoroughly discussed with the patient and questions answered. The benefits, risks, alternatives and complications were also discussed. The patient understands and wishes to proceed with the procedure. Written consent was obtained. Ultrasound was performed to localize and mark an adequate pocket of fluid in the left chest. The area was then prepped and draped  in the normal sterile fashion. 1% Lidocaine was used for local anesthesia. Under ultrasound guidance a 6 Fr Safe-T-Centesis catheter was introduced. Thoracentesis was performed. The catheter was removed and a dressing applied. FINDINGS: A total of approximately 450 mL of reddish brown fluid was removed. Samples were sent to the laboratory as requested by the clinical team. IMPRESSION: Successful ultrasound guided therapeutic left-sided thoracentesis yielding 450 mL of pleural fluid. Performed by Anders Grant NP Electronically Signed   By: Judie Petit.  Shick M.D.   On: 09/09/2023 13:58   DG Chest 2 View Result Date: 09/06/2023 CLINICAL DATA:  Cough and shortness of breath. History of lung cancer. EXAM: CHEST - 2 VIEW COMPARISON:  Chest x-ray dated August 13, 2023. FINDINGS: Unchanged right chest wall port  catheter. Stable cardiomediastinal silhouette with normal heart size. Unchanged chronic loculated large left pleural effusion with atelectasis in the lingula and left lower lobe. Slightly increased interstitial thickening in the right lung. No pneumothorax. No acute osseous abnormality. IMPRESSION: 1. Slightly increased interstitial thickening in the right lung, which may reflect atypical infection or pulmonary edema. 2. Unchanged chronic loculated large left pleural effusion. Electronically Signed   By: Obie Dredge M.D.   On: 09/06/2023 17:11   US Paracentesis Result Date: 09/06/2023 INDICATION: malignant ascites EXAM: ULTRASOUND GUIDED  PARACENTESIS MEDICATIONS: None. COMPLICATIONS: None immediate. PROCEDURE: Informed written consent was obtained from the patient after a discussion of the risks, benefits and alternatives to treatment. A timeout was performed prior to the initiation of the procedure. Initial ultrasound scanning demonstrates a large amount of ascites within the right lower abdominal quadrant. The right lower abdomen was prepped and draped in the usual sterile fashion. 1% lidocaine was used for local anesthesia. Following this, a 19 gauge, 7-cm, Yueh catheter was introduced. An ultrasound image was saved for documentation purposes. The paracentesis was performed. The catheter was removed and a dressing was applied. The patient tolerated the procedure well without immediate post procedural complication. FINDINGS: A total of approximately 4.8 L of clear, straw-colored fluid was removed. IMPRESSION: Successful ultrasound-guided paracentesis yielding 4.8 liters of peritoneal fluid. Electronically Signed   By: Olive Bass M.D.   On: 09/06/2023 16:39    PERFORMANCE STATUS (ECOG) : 3 - Symptomatic, >50% confined to bed  Review of Systems Unless otherwise noted, a complete review of systems is negative.  Physical Exam General: NAD Pulmonary: Unlabored Extremities: no edema, no joint  deformities Skin: no rashes Neurological: Weakness but otherwise nonfocal  IMPRESSION: Patient discharged home from the hospital on 09/26/2023 after being admitted with hypotonic hyponatremia.  He had recurrent malignant pleural effusion requiring thoracentesis.  Also underwent paracentesis.  Patient says he feels about the same since discharging home from the hospital.  Feels like he is not having a good day today.  Felt weak and is now receiving IV fluids.  Having intermittent nausea and vomiting.  Discussed scheduled antiemetics.  Again reviewed goals.  Patient likely with limited treatment options and performance status seems to be declining.  Hospice would be reasonable and patient is aware that it is an option. However, patient is waiting to speak with Dr. Donneta Romberg and has desired continued treatment if options are available.   Discussed CODE STATUS.  Patient and wife agreed with DNR/DNI.  DNR order signed for patient to come.  PLAN: -Continue current scope of treatment -DNR/DNI -Follow-up telephone visit 3 weeks  Case and plan discussed with Dr. Donneta Romberg  Patient expressed understanding and was in agreement with this plan. He  also understands that He can call the clinic at any time with any questions, concerns, or complaints.     Time Total: 20 minutes  Visit consisted of counseling and education dealing with the complex and emotionally intense issues of symptom management and palliative care in the setting of serious and potentially life-threatening illness.Greater than 50%  of this time was spent counseling and coordinating care related to the above assessment and plan.  Signed by: Laurette Schimke, PhD, NP-C

## 2023-10-02 NOTE — Assessment & Plan Note (Addendum)
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 s /p second opinion at [facility]- Dr.Stinchcomb.  Status post multiple lines of therapy-most recently on Carboplatin-Alimta-Amivatimab [since Oct 2024] x 2 cycles;  CT scan- DEC 2024- overall stable on imaging-contralateral right lung-interstitial thickening [atypical pneumonia versus lymphangitic spread]; Moderate left pleural effusion with chronic pleural thickening; Suspected hepatic metastases, ill-defined/poorly evaluated, with superimposed left hepatic lobe scarring/atrophy. This appearance is similar to the prior; Stable lytic metastasis in the posterior T3 vertebral body; Moderate abdominal ascites, unchanged.  # I had a long discussion with the patient his wife regarding the clinical progression-the context of eqivocal imaging of the CT scan.  Given his worsening liver function and worsening performance status I would recommend discontinuation of further chemotherapy.  HER2 testing on the tumor sample is pending.  However, very concerned if patient was able to tolerate further lines of chemotherapy.  I discussed regarding hospice-see discussion below  # Hyponatremia 129-underlying malignancy continue ADH antagonist/as per nephrology Dr. Lourdes Sledge.  # Abdominal discomfort/ascites [CT scan- NOv 2024]- s/p paracentesis x2-cytology positive for adenocarcinoma- proceed with paracentesis; declines pleurex catheter.  #Asymptomatic multiple brain mets subcentimeter asymptomatic-  -s/p SBRT SEP 2023- [[place]].  S/p SBRT [[place]]- FEB 12th, 2024. July 9th, 2024-  Stable clinically. Decreased conspicuity of all enhancing lesions with some lesions described on the prior no longer visible and others less visible. No new lesions identified  S/p evaluation with neuro-oncology- MRI Brain- OCT, 2024-clinically stable. s/p evaluation with Dr.Vaslow.   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 On  Eliquis 2.5 mg BID [platelets- 189].   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  # IV access/ port malfunction-s/p tPA.   # I had a long discussion with the patient and his wife regarding the overall poor prognosis of patients;s underlying malignancy given lack of significant improvement of clinical status/and worsening liver function-explaining his symptoms.  Discussed regarding hospice evaluation.  After much reluctance patient agrees with hospice evaluation.  Hospice referral made.  # DNR/DNI-  [Tuesday/wed]- pref B12  on 10/23  # DISPOSITION: # paracentesis ASAP # referral to hospice re: lung cancer #  follow up on 12/27 with [name] or Lauren - MD; labs- cbc/cmp; possible 1 lit IVFs over 1 hour-Dr.B

## 2023-10-02 NOTE — Procedures (Signed)
PROCEDURE SUMMARY:  Successful image-guided paracentesis from the right abdomen.  Yielded 6.1 liters of straw-colored peritoneal fluid.  No immediate complications.  EBL: zero Patient tolerated well.   Specimen was not sent for labs.  Please see imaging section of Epic for full dictation.  Bing Neighbors Kesley Gaffey PA-C 10/02/2023 3:26 PM

## 2023-10-03 LAB — MICROALBUMIN / CREATININE URINE RATIO
Creatinine, Urine: 105.7 mg/dL
Microalb Creat Ratio: 11 mg/g{creat} (ref 0–29)
Microalb, Ur: 11.8 ug/mL — ABNORMAL HIGH

## 2023-10-03 LAB — PARATHYROID HORMONE, INTACT (NO CA): PTH: 31 pg/mL (ref 15–65)

## 2023-10-04 ENCOUNTER — Telehealth: Payer: Self-pay | Admitting: *Deleted

## 2023-10-04 NOTE — Telephone Encounter (Signed)
Received FMLA/ Disability form, completed and sent for doctor signature

## 2023-10-06 ENCOUNTER — Encounter: Payer: Self-pay | Admitting: Nurse Practitioner

## 2023-10-07 ENCOUNTER — Encounter: Payer: Self-pay | Admitting: Nurse Practitioner

## 2023-10-07 NOTE — Telephone Encounter (Signed)
Form signed and faxed back to [facility]

## 2023-10-10 ENCOUNTER — Encounter: Payer: Self-pay | Admitting: Hospice and Palliative Medicine

## 2023-10-10 ENCOUNTER — Other Ambulatory Visit: Payer: Self-pay | Admitting: *Deleted

## 2023-10-10 MED ORDER — ONDANSETRON 4 MG PO TBDP: 4.0000 mg | ORAL_TABLET | Freq: Three times a day (TID) | ORAL | 1 refills | Status: DC | PRN

## 2023-10-11 ENCOUNTER — Inpatient Hospital Stay: Payer: 59

## 2023-10-11 ENCOUNTER — Other Ambulatory Visit: Payer: Self-pay

## 2023-10-11 ENCOUNTER — Encounter: Payer: Self-pay | Admitting: Nurse Practitioner

## 2023-10-11 ENCOUNTER — Inpatient Hospital Stay (HOSPITAL_BASED_OUTPATIENT_CLINIC_OR_DEPARTMENT_OTHER): Payer: 59 | Admitting: Nurse Practitioner

## 2023-10-11 VITALS — BP 100/71 | HR 110 | Temp 96.6°F | Resp 18

## 2023-10-11 DIAGNOSIS — R112 Nausea with vomiting, unspecified: Secondary | ICD-10-CM

## 2023-10-11 DIAGNOSIS — R180 Malignant ascites: Secondary | ICD-10-CM | POA: Diagnosis not present

## 2023-10-11 DIAGNOSIS — C3432 Malignant neoplasm of lower lobe, left bronchus or lung: Secondary | ICD-10-CM

## 2023-10-11 DIAGNOSIS — Z95828 Presence of other vascular implants and grafts: Secondary | ICD-10-CM

## 2023-10-11 DIAGNOSIS — E871 Hypo-osmolality and hyponatremia: Secondary | ICD-10-CM

## 2023-10-11 DIAGNOSIS — C3490 Malignant neoplasm of unspecified part of unspecified bronchus or lung: Secondary | ICD-10-CM | POA: Diagnosis not present

## 2023-10-11 LAB — CMP (CANCER CENTER ONLY)
ALT: 85 U/L — ABNORMAL HIGH (ref 0–44)
AST: 76 U/L — ABNORMAL HIGH (ref 15–41)
Albumin: 1.9 g/dL — ABNORMAL LOW (ref 3.5–5.0)
Alkaline Phosphatase: 240 U/L — ABNORMAL HIGH (ref 38–126)
Anion gap: 11 (ref 5–15)
BUN: 10 mg/dL (ref 6–20)
CO2: 26 mmol/L (ref 22–32)
Calcium: 7.4 mg/dL — ABNORMAL LOW (ref 8.9–10.3)
Chloride: 95 mmol/L — ABNORMAL LOW (ref 98–111)
Creatinine: 1.02 mg/dL (ref 0.61–1.24)
GFR, Estimated: >60 mL/min (ref >60)
Glucose, Bld: 116 mg/dL — ABNORMAL HIGH (ref 70–99)
Potassium: 3 mmol/L — ABNORMAL LOW (ref 3.5–5.1)
Sodium: 132 mmol/L — ABNORMAL LOW (ref 135–145)
Total Bilirubin: 1 mg/dL (ref <1.2)
Total Protein: 5.4 g/dL — ABNORMAL LOW (ref 6.5–8.1)

## 2023-10-11 LAB — CBC WITH DIFFERENTIAL (CANCER CENTER ONLY)
Abs Immature Granulocytes: 0.18 10*3/uL — ABNORMAL HIGH (ref 0.00–0.07)
Basophils Absolute: 0.1 10*3/uL (ref 0.0–0.1)
Basophils Relative: 1 %
Eosinophils Absolute: 0.1 10*3/uL (ref 0.0–0.5)
Eosinophils Relative: 1 %
HCT: 36.7 % — ABNORMAL LOW (ref 39.0–52.0)
Hemoglobin: 12.3 g/dL — ABNORMAL LOW (ref 13.0–17.0)
Immature Granulocytes: 2 %
Lymphocytes Relative: 18 %
Lymphs Abs: 1.8 10*3/uL (ref 0.7–4.0)
MCH: 32.3 pg (ref 26.0–34.0)
MCHC: 33.5 g/dL (ref 30.0–36.0)
MCV: 96.3 fL (ref 80.0–100.0)
Monocytes Absolute: 1.6 10*3/uL — ABNORMAL HIGH (ref 0.1–1.0)
Monocytes Relative: 17 %
Neutro Abs: 6.1 10*3/uL (ref 1.7–7.7)
Neutrophils Relative %: 61 %
Platelet Count: 169 10*3/uL (ref 150–400)
RBC: 3.81 MIL/uL — ABNORMAL LOW (ref 4.22–5.81)
RDW: 18.1 % — ABNORMAL HIGH (ref 11.5–15.5)
WBC Count: 9.8 10*3/uL (ref 4.0–10.5)
nRBC: 0.2 % (ref 0.0–0.2)

## 2023-10-11 MED ORDER — PROCHLORPERAZINE EDISYLATE 10 MG/2ML IJ SOLN
10.0000 mg | Freq: Once | INTRAMUSCULAR | Status: AC
  Administered 2023-10-11: 10 mg via INTRAVENOUS
  Filled 2023-10-11: qty 2

## 2023-10-11 MED ORDER — SODIUM CHLORIDE 0.9 % IV SOLN
INTRAVENOUS | Status: DC
  Filled 2023-10-11 (×2): qty 250

## 2023-10-11 MED ORDER — HEPARIN SOD (PORK) LOCK FLUSH 100 UNIT/ML IV SOLN
500.0000 [IU] | Freq: Once | INTRAVENOUS | Status: AC
  Administered 2023-10-11: 500 [IU]
  Filled 2023-10-11: qty 5

## 2023-10-11 MED ORDER — SODIUM CHLORIDE 0.9% FLUSH
10.0000 mL | Freq: Once | INTRAVENOUS | Status: AC
  Administered 2023-10-11: 10 mL via INTRAVENOUS
  Filled 2023-10-11: qty 10

## 2023-10-11 NOTE — Progress Notes (Signed)
Patient arrived to clinic for f/u with app. Pt actively vomiting. Verbal Order obtained from Lauren,NP for compzine 10 mg and to proceed with 1 liter of NS. Pt last took zofran at 730 am this morning.

## 2023-10-11 NOTE — Progress Notes (Signed)
[facility] CONSULT NOTE  Patient Care Team: Pcp, No as PCP - General [name], RN as Oncology Nurse Navigator [name], MD as Consulting Physician (Internal Medicine)  CHIEF COMPLAINTS/PURPOSE OF CONSULTATION: Lung cancer  Oncology History Overview Note  IMPRESSION: 1. Patchy nodular fat stranding throughout the anterior left upper quadrant peritoneal fat, nonspecific, cannot exclude peritoneal carcinomatosis. Dedicated CT abdomen/pelvis with oral and IV contrast recommended for further evaluation. 2. Dense patchy consolidation replacing much of the left lower lung lobe, appearing masslike in the superior segment left lower lobe, with associated bulging of the left major fissure and associated left lower lobe volume loss. Fine nodularity throughout both lungs with an upper lobe predominance. Asymmetric left upper lobe interlobular septal thickening. These findings are indeterminate, with differential including multilobar pneumonia, sarcoidosis or a neoplastic process. The persistence on radiographs back to 01/20/2021 despite antibiotic therapy make sarcoidosis or a neoplastic process more likely. Pulmonology consultation suggested for consideration of bronchoscopic evaluation. 3. Small dependent left pleural effusion. 4. Mild mediastinal lymphadenopathy, nonspecific. 5. Subacute healing lateral right sixth rib fracture.   DIAGNOSIS:  A. LUNG, LEFT LOWER LOBE; ENB-ASSISTED BIOPSY:  - NON-SMALL CELL CARCINOMA, FAVOR ADENOCARCINOMA.  - FOREIGN MATERIAL SUGGESTIVE OF ASPIRATION.   # EGFR MUTATED: 11 del- June 28th, 2022- [name]. [limited]  # Asymptomatic multiple brain mets subcentimeter asymptomatic -JUNE 29th, 2023- Numerous metastatic lesions in the supratentorial brain-increase in number also conspicuity compared to January 2023.  Pre-SBRT MRI brain AUG 18th, 2023-  Eighteen small enhancing brain metastases (punctate to 11 mm); were all  present on 03/28/2021-s/p SBRT SEP 2023- [[place]]; SRS/ radiation treatment for brain metastasis He finished treatment on 11-26-22.   # SEP-OCT-worsening left-sided pleural effusion-status post paracentesis; exudative; Cytology negative- ?  Progressive disease.  # NOV 2023- JAN 2024 CARBO-TAXOL-BEV+ATEZO x4 cyles- BEV+ATEZO- until APRIL 2024  # STAGE IV- [JUNE 2022] Left lower lobe lung cancer-adenocarcinoma the lung;  EGFR- 19 del POSITIVE.  While on osimertinib- NOV 1st, 2023- PET scan- shows progressive disease in liver. Liquid Biopsy/Guardant testing-positive for EGFR mutation; otherwise Negative for any novel mutations.  Repeat liver Biopsy positive for adenocarcinoma. NEGATIVE for MET amplification.  S/p second opinion at [facility]- Dr.Stinchcomb. status post 4 cycles of carbo Taxol-Atezo+Bev- JAN 25th, 2024-PET- Interval response to therapy with resolution of previously demonstrated extensive hypermetabolic activity throughout the liver. PARIL 2024- ? Partial response, but clinically worse/rising LFTs  # MAY 6th, Brain MRI-progressive multiple brain metastases  # MAY 16th, 2024- Alimta+Avastin #1    Cancer of lower lobe of left lung (HCC)  03/23/2021 Initial Diagnosis   Cancer of lower lobe of left lung (HCC)   03/29/2021 Cancer Staging   Staging form: Lung, AJCC 8th Edition - Clinical: Stage IVB (cT3, cN3, pM1c) - Signed by [name], MD on 03/29/2021   03/30/2021 - 03/30/2021 Chemotherapy   Patient is on Treatment Plan : LUNG NSCLC Pemetrexed (Alimta) / Carboplatin q21d x 1 cycles     08/17/2022 - 01/15/2023 Chemotherapy   Patient is on Treatment Plan : LUNG Atezolizumab + Bevacizumab + Carboplatin + Paclitaxel q21d Induction x 4 cycles / Atezolizumab + Bevacizumab q21d Maintenance     02/28/2023 - 07/02/2023 Chemotherapy   Patient is on Treatment Plan : LUNG NON-SMALL CELL Bevacizumab + Pemetrexed q21d     08/07/2023 - 09/06/2023 Chemotherapy   Patient is on Treatment Plan : LUNG  NSCLC Pemetrexed + Carboplatin + Amivantamab q21d x 4 Cycles / Pemetrexed + Amivantamab q21d  HISTORY OF PRESENTING ILLNESS: Ambulating independently.  Accompanied by his wife.  [name] 42 y.o.  male patient with stage IV lung cancer adenocarcinoma-brain mets [synchronous] bone mets EGFR mutated -most recently on with chemotherapy status post 2 cycles of carboplatin-Alimta- Ami is here for a follow up. He's had several hospitalizations and ER visits recently due to ongoing nausea vomiting as well as hyponatremia and worsening weakness. He's not able to tolerate much in way of food and wife says primarily eating pudding, yogurt, applesauce. At times, he vomits anything he eats. Increased fatigue and lethargy. He feels poorly. He met with hospice and elected for palliative care at home as wife and family are currently able to meet his needs. Wife requests wheelchair and bedside commode given increased frailty. She says she is able to get 1 salt tablet in per day roughly.   Patient continues to have ongoing nausea with vomiting.  Appetite is poor.  Noted to have abdominal fluid buildup.  Patient currently taking trazodone half a pill 25 mg. No worsening headaches.  Review of Systems  Constitutional:  Positive for malaise/fatigue and weight loss. Negative for chills, diaphoresis and fever.  HENT:  Negative for nosebleeds and sore throat.   Eyes:  Negative for double vision.  Respiratory:  Positive for cough. Negative for wheezing.   Cardiovascular:  Negative for chest pain, palpitations, orthopnea and leg swelling.  Gastrointestinal:  Positive for heartburn, nausea and vomiting. Negative for abdominal pain, blood in stool, constipation, diarrhea and melena.  Genitourinary:  Negative for dysuria, frequency and urgency.  Musculoskeletal:  Positive for myalgias. Negative for back pain, falls and joint pain.  Neurological:  Positive for weakness. Negative for dizziness, tingling, focal  weakness and headaches.  Endo/Heme/Allergies:  Does not bruise/bleed easily.  Psychiatric/Behavioral:  Negative for depression. The patient is not nervous/anxious and does not have insomnia.      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    SURGICAL HISTORY: Past Surgical History:  Procedure Laterality Date   FRACTURE SURGERY     broke femur when he was 6 years   PORTA CATH INSERTION N/A 03/28/2021   Procedure: PORTA CATH INSERTION;  Surgeon: Renford Dills, MD;  Location: [facility] INVASIVE CV LAB;  Service: Cardiovascular;  Laterality: N/A;   VIDEO BRONCHOSCOPY WITH ENDOBRONCHIAL NAVIGATION N/A 03/15/2021   Procedure: VIDEO BRONCHOSCOPY WITH ENDOBRONCHIAL NAVIGATION;  Surgeon: Vida Rigger, MD;  Location: [facility] ORS;  Service: Thoracic;  Laterality: N/A;   VIDEO BRONCHOSCOPY WITH ENDOBRONCHIAL ULTRASOUND N/A 03/15/2021   Procedure: VIDEO BRONCHOSCOPY WITH ENDOBRONCHIAL ULTRASOUND;  Surgeon: Vida Rigger, MD;  Location: [facility] ORS;  Service: Thoracic;  Laterality: N/A;    SOCIAL HISTORY: Social History   Socioeconomic History   Marital status: Married    Spouse name: Darl Pikes   Number of children: 2   Years of education: Not on file   Highest education level: Not on file  Occupational History   Not on file  Tobacco Use   Smoking status: Never   Smokeless tobacco: Former    Types: Snuff, Chew  Vaping Use   Vaping status: Never Used  Substance and Sexual Activity   Alcohol use: Never   Drug use: Never   Sexual activity: Yes  Other Topics Concern   Not on file  Social History Narrative   [place]; with wife; 2 daughters[12 and 22]; never smoked; rare alcohol. 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 Dog and cat.   Social Drivers of Corporate investment banker Strain: Low Risk  (05/10/2022)   Overall Financial Resource Strain (CARDIA)    Difficulty of Paying  Living Expenses: Not hard at all  Food Insecurity: No Food Insecurity (09/23/2023)   Hunger Vital Sign    Worried About Running Out of Food in the Last Year: Never true    Ran Out of Food in the Last Year: Never true  Transportation Needs: No Transportation Needs (09/23/2023)   PRAPARE - Administrator, Civil Service (Medical): No    Lack of Transportation (Non-Medical): No  Physical Activity: Not on file  Stress: Not on file  Social Connections: Socially Integrated (05/10/2022)   Social Connection and Isolation Panel [NHANES]    Frequency of Communication with Friends and Family: More than three times a week    Frequency of Social Gatherings with Friends and Family: More than three times a week    Attends Religious Services: More than 4 times per year    Active Member of [employer] or Organizations: Yes    Attends Banker Meetings: More than 4 times per year    Marital Status: Married  Catering manager Violence: Unknown (09/23/2023)   Humiliation, Afraid, Rape, and Kick questionnaire    Fear of Current or Ex-Partner: Not on file    Emotionally Abused: No    Physically Abused: No    Sexually Abused: No    FAMILY HISTORY: Family History  Problem Relation Age of Onset   High Cholesterol Mother    Hypertension Father    Lung cancer Father    Skin cancer Father     ALLERGIES:  is allergic to codeine.  MEDICATIONS:  Current Outpatient Medications  Medication Sig Dispense Refill   furosemide (LASIX) 20 MG tablet Take 2 tablets (40 mg total) by mouth daily. 30 tablet 5   metoprolol tartrate (LOPRESSOR) 25 MG tablet Take 1 tablet (25 mg total) by mouth 2 (two) times daily. 30 tablet 11   ondansetron (ZOFRAN-ODT) 4 MG disintegrating tablet Take 1 tablet (4 mg total) by mouth every 8 (eight) hours as needed for nausea or vomiting. 30 tablet 1   albuterol (VENTOLIN HFA) 108 (90 Base) MCG/ACT inhaler Inhale 2 puffs into the lungs every 6 (six) hours as needed for  wheezing or shortness of breath. (Patient not taking: Reported on 10/11/2023) 8 g 2   calcium carbonate (TUMS EX) 750 MG chewable tablet Chew 1 tablet by mouth daily. (Patient not taking: Reported on 10/11/2023)     folic acid (FOLVITE) 1 MG tablet TAKE 1 TABLET BY MOUTH EVERY DAY (Patient not taking: Reported on 10/11/2023) 90 tablet 1   lactulose (CHRONULAC) 10 GM/15ML solution Take 30 mLs (20 g total) by mouth 2 (two) times daily as needed for mild constipation or moderate constipation (Titrate to 1-2 BM per day). (Patient not taking: Reported on 10/11/2023) 236 mL 0   lidocaine-prilocaine (EMLA) cream Apply 1 Application topically as needed. (Patient not taking: Reported on 10/11/2023) 30 g 0   methylphenidate (RITALIN) 5 MG tablet Take 1 tablet (5 mg total) by mouth 2 (two) times daily as needed (fatigue). (Patient not taking: Reported on 10/02/2023) 60 tablet 0   metoCLOPramide (REGLAN) 10 MG tablet Take 1 tablet (10 mg total) by mouth every 8 (eight) hours as needed for nausea. (Patient not taking: Reported on 10/02/2023)  30 tablet 1   montelukast (SINGULAIR) 10 MG tablet TAKE 1 TABLET BY MOUTH EVERYDAY AT BEDTIME (Patient not taking: No sig reported) 90 tablet 1   pantoprazole (PROTONIX) 40 MG tablet Take 1 tablet (40 mg total) by mouth daily. (Patient not taking: Reported on 10/11/2023) 90 tablet 3   prochlorperazine (COMPAZINE) 10 MG tablet Take 1 tablet (10 mg total) by mouth every 6 (six) hours as needed for nausea or vomiting. (Patient not taking: Reported on 10/02/2023) 30 tablet 0   promethazine (PHENERGAN) 25 MG tablet Take 1 tablet (25 mg total) by mouth every 6 (six) hours as needed for nausea or vomiting. (Patient not taking: Reported on 10/02/2023) 30 tablet 0   traZODone (DESYREL) 50 MG tablet Take 0.5 tablets (25 mg total) by mouth at bedtime as needed for sleep. (Patient not taking: Reported on 10/11/2023) 15 tablet 0   triamcinolone ointment (KENALOG) 0.5 % Apply 1 Application  topically 2 (two) times daily. (Patient not taking: Reported on 10/11/2023) 30 g 1   No current facility-administered medications for this visit.   Facility-Administered Medications Ordered in Other Visits  Medication Dose Route Frequency Provider Last Rate Last Admin   0.9 %  sodium chloride infusion   Intravenous Continuous Alinda Dooms, NP   Stopped at 10/11/23 1240   heparin lock flush 100 UNIT/ML injection            heparin lock flush 100 UNIT/ML injection            heparin lock flush 100 unit/mL  500 Units Intravenous Once Borders, Ivin Booty R, NP       sodium chloride flush (NS) 0.9 % injection 10 mL  10 mL Intravenous PRN Louretta Shorten R, MD   10 mL at 07/28/21 0852   sodium chloride flush (NS) 0.9 % injection 10 mL  10 mL Intravenous Once Borders, Daryl Eastern, NP        PHYSICAL EXAMINATION: ECOG PERFORMANCE STATUS: 3 - Symptomatic, >50% confined to bed  Vitals:   10/11/23 1133  BP: 100/71  Pulse: (!) 110  Resp: 18  Temp: (!) 96.6 F (35.9 C)  SpO2: 98%   There were no vitals filed for this visit.  Physical Exam Vitals reviewed.  Constitutional:      General: He is not in acute distress.    Appearance: He is ill-appearing.     Comments: Chronically ill appearing. Appears more fatigued and has lost weight since previous visits. Less jovial. Wife Darl Pikes at chairside.   HENT:     Head: Normocephalic and atraumatic.     Mouth/Throat:     Pharynx: No oropharyngeal exudate.  Cardiovascular:     Rate and Rhythm: Regular rhythm. Tachycardia present.  Pulmonary:     Effort: No respiratory distress.     Comments: Decreased breath sound on the left side compared to right. Abdominal:     General: There is no distension.     Palpations: Abdomen is soft.     Tenderness: There is no abdominal tenderness. There is no guarding.  Musculoskeletal:        General: No tenderness.  Skin:    General: Skin is warm.     Coloration: Skin is pale. Skin is not jaundiced.      Findings: No rash.  Neurological:     Mental Status: He is alert and oriented to person, place, and time.  Psychiatric:        Mood and Affect: Affect normal.  LABORATORY DATA:  I have reviewed the data as listed Lab Results  Component Value Date   WBC 9.8 10/11/2023   HGB 12.3 (L) 10/11/2023   HCT 36.7 (L) 10/11/2023   MCV 96.3 10/11/2023   PLT 169 10/11/2023   Recent Labs    09/24/23 1103 09/24/23 1853 09/25/23 0411 09/25/23 1100 09/26/23 0455 09/26/23 1246 09/30/23 1339 10/02/23 0849 10/11/23 1120  NA 133*   < > 134*   < > 133*   < > 131* 127* 132*  K 3.5   < > 3.7   < > 4.1   < > 3.7 3.8 3.0*  CL 100   < > 103   < > 102   < > 95* 94* 95*  CO2 25   < > 25   < > 28   < > 27 26 26   GLUCOSE 147*   < > 147*   < > 91   < > 137* 117* 116*  BUN 14   < > 12   < > 10   < > 10 10 10   CREATININE 0.74   < > 0.74   < > 0.84   < > 0.72 0.80 1.02  CALCIUM 8.2*   < > 8.3*   < > 7.8*   < > 7.5* 7.7* 7.4*  GFRNONAA >60   < > >60   < > >60   < > >60 >60 >60  PROT 4.9*  --  4.3*  --  4.3*  --   --  5.0* 5.4*  ALBUMIN 3.0*  --  2.8*  --  2.4*  --   --  2.2* 1.9*  AST 52*  --  52*  --  70*  --   --  103* 76*  ALT 56*  --  54*  --  74*  --   --  108* 85*  ALKPHOS 198*  --  187*  --  203*  --   --  310* 240*  BILITOT 1.0  --  1.1  --  1.1  --   --  1.1 1.0  BILIDIR 0.5*  --  0.3*  --  0.5*  --   --   --   --   IBILI 0.5  --  0.8  --  0.6  --   --   --   --    < > = values in this interval not displayed.    RADIOGRAPHIC STUDIES: I have personally reviewed the radiological images as listed and agreed with the findings in the report. US Paracentesis Result Date: 10/02/2023 INDICATION: 42 year old male with metastatic lung cancer, with recurrent malignant ascites. Request for therapeutic paracentesis. EXAM: ULTRASOUND GUIDED THERAPEUTIC PARACENTESIS MEDICATIONS: 9 cc of 1% lidocaine COMPLICATIONS: None immediate. PROCEDURE: Informed written consent was obtained from the patient after a  discussion of the risks, benefits and alternatives to treatment. A timeout was performed prior to the initiation of the procedure. Initial ultrasound scanning demonstrates a large amount of ascites within the right lower abdominal quadrant. The right lower abdomen was prepped and draped in the usual sterile fashion. 1% lidocaine was used for local anesthesia. Following this, a 19 gauge, 7-cm, Yueh catheter was introduced. An ultrasound image was saved for documentation purposes. The paracentesis was performed. The catheter was removed and a dressing was applied. The patient tolerated the procedure well without immediate post procedural complication. FINDINGS: A total of approximately 6.1 L of straw colored peritoneal fluid was removed. IMPRESSION: Successful  ultrasound-guided paracentesis yielding 6.1 L liters of peritoneal fluid. Performed by Buzzy Han, PA-C Electronically Signed   By: Malachy Moan M.D.   On: 10/02/2023 16:50   ECHOCARDIOGRAM COMPLETE Result Date: 09/26/2023    ECHOCARDIOGRAM REPORT   Patient Name:   [name] Date of Exam: 09/26/2023 Medical Rec #:  [id number]       Height:       70.0 in Accession #:    [id number]      Weight:       190.0 lb Date of Birth:  [birth year]       BSA:          2.042 m Patient Age:    42 years        BP:           121/69 mmHg Patient Gender: M               HR:           90 bpm. Exam Location:  [facility] Procedure: 2D Echo, Cardiac Doppler and Color Doppler Indications:     Syncope R55  History:         Patient has no prior history of Echocardiogram examinations.                  Arrythmias:WPW; Signs/Symptoms:Dyspnea.  Sonographer:     Cristela Blue Referring Phys:  [id number] CARALYN HUDSON Diagnosing Phys: Alwyn Pea MD  Sonographer Comments: Suboptimal apical window. IMPRESSIONS  1. Left ventricular ejection fraction, by estimation, is 55 to 60%. The left ventricle has normal function. The left ventricle has no regional wall motion abnormalities. Left  ventricular diastolic parameters were normal.  2. Right ventricular systolic function is mildly reduced. The right ventricular size is moderately enlarged. Mildly increased right ventricular wall thickness.  3. The mitral valve is normal in structure. Trivial mitral valve regurgitation.  4. The aortic valve is normal in structure. Aortic valve regurgitation is not visualized. FINDINGS  Left Ventricle: Left ventricular ejection fraction, by estimation, is 55 to 60%. The left ventricle has normal function. The left ventricle has no regional wall motion abnormalities. The left ventricular internal cavity size was normal in size. There is  no left ventricular hypertrophy. Left ventricular diastolic parameters were normal. Right Ventricle: The right ventricular size is moderately enlarged. Mildly increased right ventricular wall thickness. Right ventricular systolic function is mildly reduced. Left Atrium: Left atrial size was normal in size. Right Atrium: Right atrial size was normal in size. Pericardium: There is no evidence of pericardial effusion. Mitral Valve: The mitral valve is normal in structure. Trivial mitral valve regurgitation. Tricuspid Valve: The tricuspid valve is normal in structure. Tricuspid valve regurgitation is trivial. Aortic Valve: The aortic valve is normal in structure. Aortic valve regurgitation is not visualized. Aortic valve mean gradient measures 2.0 mmHg. Aortic valve peak gradient measures 2.5 mmHg. Aortic valve area, by VTI measures 3.70 cm. Pulmonic Valve: The pulmonic valve was normal in structure. Pulmonic valve regurgitation is not visualized. Aorta: The ascending aorta was not well visualized. IAS/Shunts: No atrial level shunt detected by color flow Doppler.  LEFT VENTRICLE PLAX 2D LVIDd:         4.60 cm   Diastology LVIDs:         3.20 cm   LV e' medial:    7.51 cm/s LV PW:         1.20 cm   LV E/e' medial:  8.9 LV IVS:  0.60 cm   LV e' lateral:   5.98 cm/s LVOT diam:      2.20 cm   LV E/e' lateral: 11.2 LV SV:         54 LV SV Index:   26 LVOT Area:     3.80 cm  RIGHT VENTRICLE RV Basal diam:  3.60 cm RV Mid diam:    3.40 cm RV S prime:     18.00 cm/s TAPSE (M-mode): 2.0 cm LEFT ATRIUM           Index        RIGHT ATRIUM          Index LA diam:      2.40 cm 1.18 cm/m   RA Area:     6.33 cm LA Vol (A2C): 34.8 ml 17.04 ml/m  RA Volume:   8.00 ml  3.92 ml/m LA Vol (A4C): 19.5 ml 9.55 ml/m  AORTIC VALVE AV Area (Vmax):    3.43 cm AV Area (Vmean):   3.30 cm AV Area (VTI):     3.70 cm AV Vmax:           79.70 cm/s AV Vmean:          55.400 cm/s AV VTI:            0.146 m AV Peak Grad:      2.5 mmHg AV Mean Grad:      2.0 mmHg LVOT Vmax:         71.90 cm/s LVOT Vmean:        48.100 cm/s LVOT VTI:          0.142 m LVOT/AV VTI ratio: 0.97  AORTA Ao Root diam: 3.00 cm MITRAL VALVE               TRICUSPID VALVE MV Area (PHT): 7.02 cm    TR Peak grad:   8.2 mmHg MV Decel Time: 108 msec    TR Vmax:        143.00 cm/s MV E velocity: 67.10 cm/s MV A velocity: 59.20 cm/s  SHUNTS MV E/A ratio:  1.13        Systemic VTI:  0.14 m                            Systemic Diam: 2.20 cm Alwyn Pea MD Electronically signed by Alwyn Pea MD Signature Date/Time: 09/26/2023/1:51:03 PM    Final    CT CHEST ABDOMEN PELVIS W CONTRAST Result Date: 09/24/2023 CLINICAL DATA:  Follow-up non-small cell lung cancer EXAM: CT CHEST, ABDOMEN, AND PELVIS WITH CONTRAST TECHNIQUE: Multidetector CT imaging of the chest, abdomen and pelvis was performed following the standard protocol during bolus administration of intravenous contrast. RADIATION DOSE REDUCTION: This exam was performed according to the departmental dose-optimization program which includes automated exposure control, adjustment of the mA and/or kV according to patient size and/or use of iterative reconstruction technique. CONTRAST:  OMNIPAQUE IOHEXOL 300 MG/ML  SOLN COMPARISON:  CT abdomen/pelvis dated 08/13/2023. PET-CT dated  07/16/2023. FINDINGS: CT CHEST FINDINGS Cardiovascular: Heart is normal in size.  No pericardial effusion. No evidence of thoracic aortic aneurysm. Right chest port terminates at the cavoatrial junction. Mediastinum/Nodes: No suspicious mediastinal lymphadenopathy. Visualized thyroid is unremarkable. Lungs/Pleura: Moderate left pleural effusion with chronic pleural thickening. Mild complexity inferiorly (series 2/image 46). However, the effusion was non FDG avid on prior PET. Scarring/atelectasis in the lingula and left lower lobe. New patchy/ground-glass  opacities in the right upper lobe, raising concern for atypical infection/pneumonia. Mild interlobular septal thickening in the right middle and lower lobes, nonspecific. No pneumothorax. Musculoskeletal: Lytic metastasis in the posterior T3 vertebral body (series 6/image 75). CT ABDOMEN PELVIS FINDINGS Hepatobiliary: Lobular hepatic contour. Atrophy/scarring of the left hepatic lobe. Associated low-density in the left hepatic lobe suggests ill-defined metastatic disease (series 2/image 49), although difficult to measure. Additional 4.0 cm low-density lesion in segment 5 (series 2/image 57), similar to prior CT, although with only mild hypermetabolism on prior PET. Regardless, residual hepatic metastases are favored. Gallbladder is unremarkable. No intrahepatic or extrahepatic duct dilatation. Pancreas: Within normal limits. Spleen: Within normal limits. Adrenals/Urinary Tract: Adrenal glands are within normal limits. Kidneys are within normal limits.  No hydronephrosis. Bladder is within normal limits. Stomach/Bowel: Stomach is within normal limits. No evidence of bowel obstruction. Normal appendix (series 2/image 90). No colonic wall thickening or inflammatory changes. Vascular/Lymphatic: No evidence of abdominal aortic aneurysm. No suspicious abdominopelvic lymphadenopathy. Reproductive: Prostate is unremarkable. Other: Moderate abdominal ascites, partially  loculated. This was non FDG avid on prior PET. Moderate body wall edema, new. Musculoskeletal: No focal osseous lesions. IMPRESSION: New patchy/ground-glass opacities in the right upper lobe, raising concern for atypical infection/pneumonia. Moderate left pleural effusion with chronic pleural thickening, unchanged. Suspected hepatic metastases, ill-defined/poorly evaluated, with superimposed left hepatic lobe scarring/atrophy. This appearance is similar to the prior. Stable lytic metastasis in the posterior T3 vertebral body. Moderate abdominal ascites, unchanged. Moderate body wall edema, new. Electronically Signed   By: Charline Bills M.D.   On: 09/24/2023 20:03   US Paracentesis Result Date: 09/24/2023 INDICATION: History of metastatic lung cancer with recurrent malignant ascites. Request is for therapeutic paracentesis EXAM: ULTRASOUND GUIDED THERAPEUTIC PARACENTESIS MEDICATIONS: Lidocaine 1% 10 mL COMPLICATIONS: None immediate. PROCEDURE: Informed written consent was obtained from the patient after a discussion of the risks, benefits and alternatives to treatment. A timeout was performed prior to the initiation of the procedure. Initial ultrasound scanning demonstrates a small amount of ascites within the right lower abdominal quadrant. The right lower abdomen was prepped and draped in the usual sterile fashion. 1% lidocaine was used for local anesthesia. Following this, a 19 gauge, 7-cm, Yueh catheter was introduced. An ultrasound image was saved for documentation purposes. The paracentesis was performed. The catheter was removed and a dressing was applied. The patient tolerated the procedure well without immediate post procedural complication. Patient received post-procedure intravenous albumin; see nursing notes for details. FINDINGS: A total of approximately 2.7 L of straw-colored fluid was removed. Samples were sent to the laboratory as requested by the clinical team. IMPRESSION: Successful  ultrasound-guided therapeutic paracentesis yielding 2.7 liters of peritoneal fluid. Performed by Anders Grant NP Electronically Signed   By: Judie Petit.  Shick M.D.   On: 09/24/2023 16:04   US THORACENTESIS ASP PLEURAL SPACE W/IMG GUIDE Result Date: 09/23/2023 INDICATION: Patient with a history of lung cancer with recurrent pleural effusions. Interventional radiology asked to perform a therapeutic thoracentesis. EXAM: ULTRASOUND GUIDED THORACENTESIS MEDICATIONS: 1% lidocaine 15 mL COMPLICATIONS: None immediate. PROCEDURE: An ultrasound guided thoracentesis was thoroughly discussed with the patient and questions answered. The benefits, risks, alternatives and complications were also discussed. The patient understands and wishes to proceed with the procedure. Written consent was obtained. Ultrasound was performed to localize and mark an adequate pocket of fluid in the left chest. The area was then prepped and draped in the normal sterile fashion. 1% Lidocaine was used for local anesthesia. Under ultrasound guidance a  6 Fr Safe-T-Centesis catheter was introduced. Thoracentesis was performed. The catheter was removed and a dressing applied. FINDINGS: A total of approximately 450 mL of dark red fluid was removed. IMPRESSION: Successful ultrasound guided left thoracentesis yielding 450 mL of pleural fluid. Procedure performed by Alwyn Ren NP Electronically Signed   By: Judie Petit.  Shick M.D.   On: 09/23/2023 11:02   DG Chest Port 1 View Result Date: 09/23/2023 CLINICAL DATA:  Status post left thoracentesis. EXAM: PORTABLE CHEST 1 VIEW COMPARISON:  September 21, 2023. FINDINGS: No pneumothorax is noted status post left thoracentesis. Left pleural effusion is slightly decreased compared to prior exam. IMPRESSION: No pneumothorax status post left thoracentesis. Electronically Signed   By: Lupita Raider M.D.   On: 09/23/2023 10:54   DG Chest 2 View Result Date: 09/21/2023 CLINICAL DATA:  Suspected sepsis. Lung and liver  cancer per patient. Vomiting. EXAM: CHEST - 2 VIEW COMPARISON:  Chest radiographs 09/09/2023 and 09/06/2023. Chest CT 07/16/2022. FINDINGS: Right IJ Port-A-Cath extends to the level of the superior cavoatrial junction, stable. The visualized heart size and mediastinal contours are stable. A large laterally loculated left pleural effusion appears slightly increased compared with the most recent prior study. Associated chronic compressive left lung atelectasis. No evidence of right-sided pleural effusion or pneumothorax. Mild right basilar atelectasis, unchanged. The bones appear unchanged. IMPRESSION: Slight increase in size of a large laterally loculated left pleural effusion with associated chronic compressive left lung atelectasis. No other significant changes. Electronically Signed   By: Carey Bullocks M.D.   On: 09/21/2023 14:19   US Paracentesis Result Date: 09/17/2023 INDICATION: Metastatic lung cancer and malignant ascites. Request for therapeutic paracentesis. EXAM: ULTRASOUND GUIDED PARACENTESIS MEDICATIONS: 1% lidocaine 10 mL COMPLICATIONS: None immediate. PROCEDURE: Informed written consent was obtained from the patient after a discussion of the risks, benefits and alternatives to treatment. A timeout was performed prior to the initiation of the procedure. Initial ultrasound scanning demonstrates a large amount of ascites within the right lateral abdomen. The right lateral abdomen was prepped and draped in the usual sterile fashion. 1% lidocaine was used for local anesthesia. Following this, a 19 gauge, 7-cm, Yueh catheter was introduced. An ultrasound image was saved for documentation purposes. The paracentesis was performed. The catheter was removed and a dressing was applied. The patient tolerated the procedure well without immediate post procedural complication. FINDINGS: A total of approximately 5.6 L of clear yellow fluid was removed. IMPRESSION: Successful ultrasound-guided paracentesis  yielding 5.6 liters of peritoneal fluid. Electronically Signed   By: Gilmer Mor D.O.   On: 09/17/2023 12:03    ASSESSMENT & PLAN:   Stage iv lung cancer. S/p multiple lines of therapy. Progression recently with worsening symptoms. Multple hospitalizations related to disease. Treatment on hold. Hospice has been recommended but declined. Patient has elected for supportive care and receiving outpatient palliative care. As he symptomatically worsens, they will consider hospice for additional support.  Nausea, vomiting- ascites? Brain lesions have been stable. compazine in clinic. IV fluids today. Clinically indicated.  Hyponatremia- d/t underlying malignancy. Continue ADH antagonist per nephrology.  Impaired mobility- d/t to progressive weakness. Prescriptions for lightweight wheelchair and 3 in 1 provided today. Urinal provided. Prefers his bed to hospital bed.   Rtc next week twice for labs, fluids. He'll see palliative care for symptom management weekly.   No problem-specific Assessment & Plan notes found for this encounter.  All questions were answered. The patient knows to call the clinic with any problems, questions or concerns.  Alinda Dooms, NP 10/11/2023

## 2023-10-14 ENCOUNTER — Inpatient Hospital Stay: Payer: 59

## 2023-10-14 ENCOUNTER — Other Ambulatory Visit: Payer: Self-pay | Admitting: *Deleted

## 2023-10-14 ENCOUNTER — Encounter: Payer: Self-pay | Admitting: Nurse Practitioner

## 2023-10-14 VITALS — BP 114/79 | HR 112 | Temp 96.4°F | Resp 18

## 2023-10-14 DIAGNOSIS — C3432 Malignant neoplasm of lower lobe, left bronchus or lung: Secondary | ICD-10-CM

## 2023-10-14 DIAGNOSIS — R112 Nausea with vomiting, unspecified: Secondary | ICD-10-CM

## 2023-10-14 DIAGNOSIS — Z95828 Presence of other vascular implants and grafts: Secondary | ICD-10-CM

## 2023-10-14 DIAGNOSIS — E871 Hypo-osmolality and hyponatremia: Secondary | ICD-10-CM

## 2023-10-14 LAB — CMP (CANCER CENTER ONLY)
ALT: 93 U/L — ABNORMAL HIGH (ref 0–44)
AST: 103 U/L — ABNORMAL HIGH (ref 15–41)
Albumin: 1.9 g/dL — ABNORMAL LOW (ref 3.5–5.0)
Alkaline Phosphatase: 237 U/L — ABNORMAL HIGH (ref 38–126)
Anion gap: 10 (ref 5–15)
BUN: 10 mg/dL (ref 6–20)
CO2: 27 mmol/L (ref 22–32)
Calcium: 7.5 mg/dL — ABNORMAL LOW (ref 8.9–10.3)
Chloride: 97 mmol/L — ABNORMAL LOW (ref 98–111)
Creatinine: 1 mg/dL (ref 0.61–1.24)
GFR, Estimated: >60 mL/min (ref >60)
Glucose, Bld: 140 mg/dL — ABNORMAL HIGH (ref 70–99)
Potassium: 3.1 mmol/L — ABNORMAL LOW (ref 3.5–5.1)
Sodium: 134 mmol/L — ABNORMAL LOW (ref 135–145)
Total Bilirubin: 1.2 mg/dL (ref 0.0–1.2)
Total Protein: 5.4 g/dL — ABNORMAL LOW (ref 6.5–8.1)

## 2023-10-14 MED ORDER — HEPARIN SOD (PORK) LOCK FLUSH 100 UNIT/ML IV SOLN
500.0000 [IU] | Freq: Once | INTRAVENOUS | Status: AC
Start: 2023-10-14 — End: 2023-10-14
  Administered 2023-10-14: 500 [IU]
  Filled 2023-10-14: qty 5

## 2023-10-14 MED ORDER — SODIUM CHLORIDE 0.9% FLUSH
10.0000 mL | Freq: Once | INTRAVENOUS | Status: AC
Start: 2023-10-14 — End: 2023-10-14
  Administered 2023-10-14: 10 mL via INTRAVENOUS
  Filled 2023-10-14: qty 10

## 2023-10-14 MED ORDER — SODIUM CHLORIDE 0.9 % IV SOLN
INTRAVENOUS | Status: DC
  Filled 2023-10-14 (×2): qty 250

## 2023-10-17 ENCOUNTER — Other Ambulatory Visit: Payer: Managed Care, Other (non HMO)

## 2023-10-17 ENCOUNTER — Ambulatory Visit: Payer: Managed Care, Other (non HMO)

## 2023-10-17 ENCOUNTER — Inpatient Hospital Stay: Payer: Managed Care, Other (non HMO) | Attending: Internal Medicine | Admitting: Hospice and Palliative Medicine

## 2023-10-17 ENCOUNTER — Inpatient Hospital Stay: Payer: Managed Care, Other (non HMO)

## 2023-10-17 ENCOUNTER — Inpatient Hospital Stay: Payer: 59

## 2023-10-17 ENCOUNTER — Telehealth: Payer: Self-pay | Admitting: Hospice and Palliative Medicine

## 2023-10-17 ENCOUNTER — Encounter: Payer: Self-pay | Admitting: *Deleted

## 2023-10-17 ENCOUNTER — Encounter: Payer: Self-pay | Admitting: Hospice and Palliative Medicine

## 2023-10-17 ENCOUNTER — Encounter: Payer: Self-pay | Admitting: Nurse Practitioner

## 2023-10-17 ENCOUNTER — Inpatient Hospital Stay: Payer: Managed Care, Other (non HMO) | Admitting: Hospice and Palliative Medicine

## 2023-10-17 DIAGNOSIS — Z515 Encounter for palliative care: Secondary | ICD-10-CM | POA: Diagnosis not present

## 2023-10-17 DIAGNOSIS — C3432 Malignant neoplasm of lower lobe, left bronchus or lung: Secondary | ICD-10-CM | POA: Diagnosis not present

## 2023-10-17 NOTE — Telephone Encounter (Signed)
 This patients wife called and asked for [name] to give her a call. I told her I would send him a message

## 2023-10-17 NOTE — Progress Notes (Signed)
 Virtual Visit via Telephone Note  I connected with [name] on 10/17/23 at  1:30 PM EST by telephone and verified that I am speaking with the correct person using two identifiers.  Location: Patient: Home Provider: Clinic   I discussed the limitations, risks, security and privacy concerns of performing an evaluation and management service by telephone and the availability of in person appointments. I also discussed with the patient that there may be a patient responsible charge related to this service. The patient expressed understanding and agreed to proceed.   History of Present Illness: CHE RACHAL is a 43 y.o. male with multiple medical problems including stage IV adenocarcinoma of the lung status post multiple previous lines of systemic treatment.  Palliative care was consulted to address goals.    Observations/Objective: Previous hospice referral was sent but patient was not ready.  Overall, he is doing poorly with declining performance status.  I spoke with him today by phone.  He verbalized that his cancer treatment was over.  And we discussed focusing on increasing supportive care at home.  Patient says he is now in agreement with hospice.  Assessment and Plan: Stage IV non-small cell lung cancer -referral to hospice.  Best supportive care.  Follow Up Instructions: As needed   I discussed the assessment and treatment plan with the patient. The patient was provided an opportunity to ask questions and all were answered. The patient agreed with the plan and demonstrated an understanding of the instructions.   The patient was advised to call back or seek an in-person evaluation if the symptoms worsen or if the condition fails to improve as anticipated.  I provided 10 minutes of non-face-to-face time during this encounter.   FONDA JONELLE MOWER, NP

## 2023-10-21 ENCOUNTER — Encounter: Payer: Self-pay | Admitting: Internal Medicine

## 2023-10-21 NOTE — Progress Notes (Signed)
 I called to check on patient.  I spoke to patient wife currently in hospice, fairly comfortable.  Continue hospice care. GB

## 2023-10-22 ENCOUNTER — Other Ambulatory Visit: Payer: Self-pay | Admitting: Hospice and Palliative Medicine

## 2023-10-22 ENCOUNTER — Ambulatory Visit
Admission: RE | Admit: 2023-10-22 | Discharge: 2023-10-22 | Disposition: A | Discharge status: Home / Self Care | Payer: Managed Care, Other (non HMO) | Source: Ambulatory Visit | Attending: Hospice and Palliative Medicine | Admitting: Hospice and Palliative Medicine

## 2023-10-22 ENCOUNTER — Encounter: Payer: Self-pay | Admitting: Nurse Practitioner

## 2023-10-22 ENCOUNTER — Other Ambulatory Visit: Payer: Self-pay | Admitting: Radiology

## 2023-10-22 DIAGNOSIS — C3432 Malignant neoplasm of lower lobe, left bronchus or lung: Secondary | ICD-10-CM | POA: Diagnosis not present

## 2023-10-22 DIAGNOSIS — J90 Pleural effusion, not elsewhere classified: Secondary | ICD-10-CM | POA: Diagnosis present

## 2023-10-22 DIAGNOSIS — Z9889 Other specified postprocedural states: Secondary | ICD-10-CM

## 2023-10-22 MED ORDER — LIDOCAINE HCL (PF) 1 % IJ SOLN
10.0000 mL | Freq: Once | INTRAMUSCULAR | Status: AC
  Administered 2023-10-22: 10 mL via INTRADERMAL
  Filled 2023-10-22: qty 10

## 2023-10-22 NOTE — Addendum Note (Signed)
 Addended by: Laurette Schimke R on: 10/22/2023 03:10 PM   Modules accepted: Orders

## 2023-10-22 NOTE — Procedures (Signed)
 PROCEDURE SUMMARY:  Successful US guided paracentesis from the right lower abdomen.  Yielded 1.7L of clear, yellow fluid.  No immediate complications.  Pt tolerated well.    EBL < 5mL  Eleni Frank N Rakia Frayne PA-C 10/22/2023 3:55 PM

## 2023-10-23 ENCOUNTER — Encounter: Payer: Self-pay | Admitting: Internal Medicine

## 2023-10-23 ENCOUNTER — Other Ambulatory Visit: Payer: Self-pay | Admitting: Hospice and Palliative Medicine

## 2023-10-23 MED ORDER — OXYCODONE HCL 5 MG PO TABS: 5.0000 mg | ORAL_TABLET | ORAL | 0 refills | Status: DC | PRN

## 2023-10-23 MED ORDER — FENTANYL 25 MCG/HR TD PT72: 1.0000 | MEDICATED_PATCH | TRANSDERMAL | 0 refills | Status: DC

## 2023-10-23 NOTE — Progress Notes (Signed)
 Received call from hospice nurse, Daphne.  Patient having worse pain.  Hospice has liberalized oxycodone  to 10 mg every 4 hours, which patient has been receiving around-the-clock.  Will start patient on transdermal fentanyl  25 mcg every 72 hours.  Will also refill oxycodone  as needed for breakthrough pain.

## 2023-10-24 ENCOUNTER — Encounter: Payer: Self-pay | Admitting: Hospice and Palliative Medicine

## 2023-10-25 ENCOUNTER — Encounter: Payer: Self-pay | Admitting: *Deleted

## 2023-10-25 NOTE — Progress Notes (Signed)
 Spoke to patient wife answered her questions await hospice evaluation.

## 2023-10-29 ENCOUNTER — Inpatient Hospital Stay: Payer: Managed Care, Other (non HMO) | Admitting: Hospice and Palliative Medicine

## 2023-11-06 NOTE — Progress Notes (Signed)
Patient consented and enrolled in [facility] services on 06/21/23. Patient scheduled initial behavioral health evaluation for 06/26/23.

## 2023-11-07 NOTE — Progress Notes (Signed)
[facility] Care Discharge Summary  Member has been discharged from [facility]. Please see below Discharge Plan for details. If you have any questions, please contact our Clinical Team at [phone].  Name Lawrnce, [name] Date of Birth 05/18/1981 Discharge Date: 2023-11-07 Discharged Reason No longer eligible: Deceased Discharging to: Referring Specialist Referring Provider: Laurette Schimke, Nathan  Current Psychiatric Medications N/A Referrals: N/A  Discharge Summary Member attended weekly [facility] visits since initial intake on 06/26/2023. He did not engage in health coaching. The PHQ-9 and GAD-7 were administered once on intake. The FACT assessment was administered twice due to the specificity and language resonating more with Member, thereby providing more clinical insight. Member endorsed worrying about the progression of his cancer, but felt he had good support from his friends and family and early in care was able to keep working and manage chemo side effects reasonably well. BHCM checked in weekly on Member's medical appts/tests, somatic symptoms and side effects, while also creating space to discuss Member's values and passions presently and throughout life such as hunting and wood sports. When Member's chemotherapy treatment was adjusted to due cancer progression, his fatigue worsened to a severe degree, and our consulting psychiatrist recommended a low-dose methylphenidate prescription, however per Member report he never tried this despite picking it up. Unfortunately he wasn't able to attend his 2nd opinion at [facility] in [place] and his health rapidly declined toward end of time at [facility]. BHCM continued to check in until 10/15/2023 and Member was admitted to [facility] hospice 10/18/2023. [facility] left HIPAA-compliant voicemail & emailed offering resource navigation support to Member's family on 1/7, but did not hear back.   Final Intake Summary [name] a.k.a. 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 [name] was initially dx'd with cancer in 2022. An MRI in May of this year demonstrated progression in multiple brain metastases. [name] may be open to psychiatric medication recommendations, but feels stable on his current sertraline 50mg  prescribed by [name].  Assessments:  [name] scored PHQ-9=2 (minimal), reporting poor appetite and trouble concentrating. [name] is at his best right now since it's toward the end of the time between chemo treatments, so his sxs are comparatively minimal. [name] does not feel he has depression; he feels the cancer and txs create circumstances that result in depressive sxs.  [name] scored GAD-7=2 (minimal), reporting trouble relaxing. [name] denied any significant anxiety and reported having acceptance and peace around his condition. He stated there are things he dreads, such as total brain radiation, but he would not consider this anxiety-provoking.   Somatic Sxs: [name] reported SOB that makes a brisk walk "too much" and dealt with a plethora of side effects after last chemo, including nosebleeds, abdominal/muscle/bone pains, high BP, and severe fatigue. Lately he has been feeling much better. He has his next chemo Tuesday 9/17.   Psych Tx/Hx: [name] is prescribed Sertraline 50mg  daily by [name]. He initiated Sertraline at 25mg  on 03/29/2021 after a hospitalization and then titrated up to 50mg  after 1 month.   Psychosocial Hx: [name] lives with his wife [name] of 14 years, his 6 y/o daughter, and 64 y/o [name]. 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 [name] enjoys hunting in the fall and winter.  Assigned Dx: U98.11: Adjustment disorder, unspecified  [place] Care Provider: [name], Behavioral Health Care Manager

## 2023-11-11 ENCOUNTER — Inpatient Hospital Stay: Admission: RE | Admit: 2023-11-11 | Payer: Managed Care, Other (non HMO) | Source: Ambulatory Visit

## 2023-11-15 ENCOUNTER — Ambulatory Visit: Payer: Managed Care, Other (non HMO) | Admitting: Internal Medicine

## 2023-11-16 DEATH — deceased

## 2024-01-14 ENCOUNTER — Ambulatory Visit: Payer: 59 | Admitting: Dermatology
